# Patient Record
Sex: Male | Born: 1950 | Race: White | Hispanic: No | Smoking: Current every day smoker
Health system: Southern US, Community
[De-identification: ages and names within clinical notes are randomized; demographics above are authoritative.]

## PROBLEM LIST (undated history)

## (undated) DIAGNOSIS — K08109 Complete loss of teeth, unspecified cause, unspecified class: Secondary | ICD-10-CM

## (undated) DIAGNOSIS — I10 Essential (primary) hypertension: Secondary | ICD-10-CM

## (undated) DIAGNOSIS — Z87442 Personal history of urinary calculi: Secondary | ICD-10-CM

## (undated) DIAGNOSIS — D696 Thrombocytopenia, unspecified: Secondary | ICD-10-CM

## (undated) DIAGNOSIS — N200 Calculus of kidney: Secondary | ICD-10-CM

## (undated) DIAGNOSIS — J189 Pneumonia, unspecified organism: Secondary | ICD-10-CM

## (undated) DIAGNOSIS — R7302 Impaired glucose tolerance (oral): Secondary | ICD-10-CM

## (undated) DIAGNOSIS — J449 Chronic obstructive pulmonary disease, unspecified: Secondary | ICD-10-CM

## (undated) DIAGNOSIS — I739 Peripheral vascular disease, unspecified: Secondary | ICD-10-CM

## (undated) DIAGNOSIS — K859 Acute pancreatitis without necrosis or infection, unspecified: Secondary | ICD-10-CM

## (undated) DIAGNOSIS — N529 Male erectile dysfunction, unspecified: Secondary | ICD-10-CM

## (undated) DIAGNOSIS — IMO0002 Reserved for concepts with insufficient information to code with codable children: Secondary | ICD-10-CM

## (undated) DIAGNOSIS — Q899 Congenital malformation, unspecified: Secondary | ICD-10-CM

## (undated) DIAGNOSIS — Z972 Presence of dental prosthetic device (complete) (partial): Secondary | ICD-10-CM

## (undated) HISTORY — DX: Thrombocytopenia, unspecified: D69.6

## (undated) HISTORY — DX: Acute pancreatitis without necrosis or infection, unspecified: K85.90

## (undated) HISTORY — PX: MULTIPLE TOOTH EXTRACTIONS: SHX2053

## (undated) HISTORY — DX: Male erectile dysfunction, unspecified: N52.9

## (undated) HISTORY — DX: Reserved for concepts with insufficient information to code with codable children: IMO0002

## (undated) HISTORY — DX: Calculus of kidney: N20.0

## (undated) HISTORY — PX: APPENDECTOMY: SHX54

## (undated) HISTORY — DX: Impaired glucose tolerance (oral): R73.02

## (undated) HISTORY — DX: Chronic obstructive pulmonary disease, unspecified: J44.9

## (undated) HISTORY — PX: ORCHIECTOMY: SHX2116

## (undated) HISTORY — PX: VASECTOMY: SHX75

## (undated) HISTORY — PX: CHOLECYSTECTOMY: SHX55

---

## 1998-06-05 ENCOUNTER — Emergency Department (HOSPITAL_COMMUNITY): Admission: EM | Admit: 1998-06-05 | Discharge: 1998-06-05 | Payer: Self-pay | Admitting: Emergency Medicine

## 1998-06-14 ENCOUNTER — Observation Stay (HOSPITAL_COMMUNITY): Admission: EM | Admit: 1998-06-14 | Discharge: 1998-06-15 | Payer: Self-pay | Admitting: Urology

## 2001-08-22 ENCOUNTER — Ambulatory Visit (HOSPITAL_COMMUNITY): Admission: RE | Admit: 2001-08-22 | Discharge: 2001-08-22 | Payer: Self-pay | Admitting: Family Medicine

## 2001-08-22 ENCOUNTER — Encounter: Payer: Self-pay | Admitting: Family Medicine

## 2002-09-24 ENCOUNTER — Inpatient Hospital Stay (HOSPITAL_COMMUNITY): Admission: EM | Admit: 2002-09-24 | Discharge: 2002-09-28 | Payer: Self-pay | Admitting: Emergency Medicine

## 2002-09-24 ENCOUNTER — Encounter: Payer: Self-pay | Admitting: Emergency Medicine

## 2002-09-25 ENCOUNTER — Encounter: Payer: Self-pay | Admitting: Family Medicine

## 2002-09-26 ENCOUNTER — Encounter (INDEPENDENT_AMBULATORY_CARE_PROVIDER_SITE_OTHER): Payer: Self-pay | Admitting: Internal Medicine

## 2004-05-26 ENCOUNTER — Inpatient Hospital Stay (HOSPITAL_COMMUNITY): Admission: EM | Admit: 2004-05-26 | Discharge: 2004-05-29 | Payer: Self-pay | Admitting: *Deleted

## 2009-09-02 ENCOUNTER — Ambulatory Visit (HOSPITAL_COMMUNITY): Admission: RE | Admit: 2009-09-02 | Discharge: 2009-09-02 | Payer: Self-pay | Admitting: Family Medicine

## 2009-09-07 ENCOUNTER — Encounter: Payer: Self-pay | Admitting: Internal Medicine

## 2009-09-16 ENCOUNTER — Ambulatory Visit (HOSPITAL_COMMUNITY): Admission: RE | Admit: 2009-09-16 | Discharge: 2009-09-16 | Payer: Self-pay | Admitting: Family Medicine

## 2011-01-16 NOTE — Letter (Signed)
Summary: TCS ORDER  TCS ORDER   Imported By: Elinor Parkinson 09/07/2009 12:34:29  _____________________________________________________________________  External Attachment:    Type:   Image     Comment:   External Document

## 2011-05-04 NOTE — Procedures (Signed)
NAME:  Raymond Walker, Raymond Walker                          ACCOUNT NO.:  [id number]   MEDICAL RECORD NO.:  [id number]                   PATIENT TYPE:  INP   LOCATION:  A209                                 FACILITY:  [facility]   PHYSICIAN:  Edward L. Juanetta Gosling, M.D.             DATE OF BIRTH:  05-06-1951   DATE OF PROCEDURE:  DATE OF DISCHARGE:  05/29/2004                              PULMONARY FUNCTION TEST   FINDINGS:  1. Spirometry shows no definite ventilatory defect, but evidence of air-flow     obstruction.  2. Lung volumes showed no evidence of restrictive change, but do show some     air trapping.  3. DLCO is borderline reduced.      ___________________________________________                                            Oneal Deputy. Juanetta Gosling, M.D.   ELH/MEDQ  D:  05/30/2004  T:  05/30/2004  Job:  [id number]   cc:   Lorin Picket A. Gerda Diss, M.D.  [address], [address]  [place]  [place] [address]  Fax: [phone]

## 2011-05-04 NOTE — Discharge Summary (Signed)
NAME:  Raymond Walker, Raymond Walker                          ACCOUNT NO.:  [id number]   MEDICAL RECORD NO.:  [id number]                   PATIENT TYPE:  INP   LOCATION:  A209                                 FACILITY:  [facility]   PHYSICIAN:  Scott A. Gerda Diss, M.D.               DATE OF BIRTH:  [birth year]   DATE OF ADMISSION:  05/25/2004  DATE OF DISCHARGE:  05/29/2004                                 DISCHARGE SUMMARY   DISCHARGE DIAGNOSIS:  Chest pain.   It was felt that this could well be costochondritis but is hard to discern.  The patient was admitted in, treated with enzymes, cardiac consult,  cardiology stay because of his previous cardiac disease, and they recommend  going ahead and doing some testing. A D-dimer and CT scan of the chest was  done that did not show any pulmonary embolus. He was held over the weekend  because of inability to get a Cardiolite study done over the weekend, and he  was very stable. A CT scan was done on June 11 that did not show pulmonary  embolus. On June 12, he did not have any chest pain. On June 13, he had  Cardiolite study done, and the Cardiolite images reveal no ischemia. His  ejection fraction overall was good, and he was discharged to home to follow  up with Dr. ___________ management of his risk factors, and he was  encouraged strongly to stop smoking.     ___________________________________________                                         Jonna Coup. Gerda Diss, M.D.   Linus Orn  D:  07/06/2004  T:  07/06/2004  Job:  [id number]

## 2011-05-04 NOTE — H&P (Signed)
NAME:  Raymond Walker, Raymond Walker                          ACCOUNT NO.:  [id number]   MEDICAL RECORD NO.:  [id number]                   PATIENT TYPE:  INP   LOCATION:  A201                                 FACILITY:  [facility]   PHYSICIAN:  Hanley Hays. Dechurch, M.D.           DATE OF BIRTH:  July 24, 1951   DATE OF ADMISSION:  09/24/2002  DATE OF DISCHARGE:                                HISTORY & PHYSICAL   HISTORY OF PRESENT ILLNESS:  The patient is a 60 year old Caucasian male  followed by Dr. Gerda Diss with no chronic medical problems.  He started with  periumbilical pain about four days prior to admission which radiated to the  epigastric area and persisted.  Today he developed associated nausea and  vomiting x 1.  No coffee grounds.  Vomit was described as string-like  tissue.  No exacerbation with meals or p.o.'s, and states he was eating  normally up until today.  Today he noticed increased pain with movement.  He  was seen by his primary M.D. today in the office and given Vicodin and  Nexium, but the pain and nausea increased, and the patient presented to the  emergency room.   REVIEW OF SYSTEMS:  Pertinent for no fevers, no chills.  Urine output has  been decreased since this a.m., but states he has been eating and drinking  normally.  Denies any alcohol or drug use.  Positive obstipation, usually  stools three to four times daily since his cholecystectomy.  No chest pain.  He had some shortness of breath initially, but that was associated with the  pain.  History of nephrolithiasis, but this pain is different.  Denies any  reflux or any other GI complaints.  Denies any history of hypertension, pain  not like his kidney stones.   ALLERGIES:  None known.   MEDICATIONS:  No usual medications.  He was given Vicodin and Nexium today.  No over-the-counter medicines or preps.   SOCIAL HISTORY:  Alcohol:  None.  He is a Games developer.  He is married,  has six children.  Smokes 2-1/2 packs per day  and has done so for years.   FAMILY MEDICAL HISTORY:  Mother has had multiple MIs.  Father died of cancer  and apparently had some sort of GI complaints though unknown what type  cancer.  A daughter has hereditary pancreatitis.   PAST MEDICAL HISTORY:  1. Significant for multiple episodes of nephrolithiasis.  2. Cholecystitis, status post cholecystectomy in 1996.   PHYSICAL EXAMINATION:  GENERAL:  Sedated gentleman who appears comfortable,  in no distress.  VITAL SIGNS:  Blood pressure 130/70, pulse in the 70s and regular,  respiratory rate is 10.  LUNGS:  Quiet.  NECK:  There is no JVD, adenopathy, thyromegaly.  No axillary or inguinal  adenopathy.  HEART:  Regular.  No murmur or gallop.  ABDOMEN:  Protuberant, with few bowel sounds.  Soft.  Positive  right  epigastric tenderness, very pronounced.  No masses noted.  No hepatomegaly.  No bruits.  RECTAL:  Per the ER M.D. and reportedly negative, with heme-negative stool.   LABORATORY DATA:  Including BMP, liver profile, CBC normal.  Lipase 65,  amylase normal.   ASSESSMENT AND PLAN:  1. Abdominal pain, increasing and persistent, with mild increased lipase and     no other symptoms.  Belly films pending.  Family medical history of     pancreatitis without any history of alcohol.  Question retained stone     versus hyperlipidemia.  The patient needs further workup.  Admit, bowel     rest, serial monitoring of enzymes, GI consultation in the morning.  2. Tobacco abuse.  3. Nephrolithiasis, recurrent.  Urinalysis is pending, though this is not     consistent with same.  CT scan was ordered but, unfortunately, due to     technical difficulties cannot be obtained tonight.  Currently he is     hemodynamically stable.  Admit with IV fluids and the plan as noted     above.                                               Hanley Hays Josefine Class, M.D.    FED/MEDQ  D:  09/24/2002  T:  09/25/2002  Job:  [id number]

## 2011-05-04 NOTE — Op Note (Signed)
NAME:  Raymond Walker, Raymond Walker                          ACCOUNT NO.:  [id number]   MEDICAL RECORD NO.:  [id number]                   PATIENT TYPE:  INP   LOCATION:  A201                                 FACILITY:  [facility]   PHYSICIAN:  Lionel December, M.D.                 DATE OF BIRTH:  08-13-1951   DATE OF PROCEDURE:  09/27/2002  DATE OF DISCHARGE:                                 OPERATIVE REPORT   PROCEDURE:  Esophagogastroduodenoscopy followed by sequential dilatation.   ENDOSCOPIST:  Lionel December, M.D.   INDICATIONS:  This patient is a 60 year old black male who has acute  pancreatitis.  His presentation and lab studies but there is no pancreatic  abnormalities seen on ultrasound or CT.  While he was getting a CT he  vomited contrast and some blood as well.  Therefore, I am concerned that he  could have a penetrating ulcer causing his pancreatitis and undergoing this  study.  The procedure and risks were reviewed with the patient and informed  consent was obtained.   PREOPERATIVE MEDICATIONS:  Cetacaine spray for pharyngeal topical  anesthesia, Demerol 50 mg IV and Versed 4 mg IV in divided dose.   INSTRUMENT:  Olympus video system.   FINDINGS:  Procedure performed in endoscopy suite.  The patient's vital  signs and O2 saturation were monitored during the procedure and remained  stable.  The patient was placed in the left lateral recumbent position and  endoscope was passed via the oropharynx without any difficulty into the  esophagus.   ESOPHAGUS:  Mucosa of the esophagus was normal except that distally the area  had 3 erosions.  The squamocolumnar junction was unremarkable; and there was  a small sliding hiatal hernia.   STOMACH:  It had some bile in it, but there was no good debris.  The stomach  distended very well with insufflation.  Examination of the mucosa revealed  erythema and edema at body and antrum, but no erosions or ulcers were noted.  Pyloric channel was patent.   Angularis and fundus were examined by  retroflexing the scope and were normal.   DUODENUM:  Examination of the bulb and second part of the duodenum was  normal.   The endoscope was withdrawn.  The patient tolerated the procedure well.   FINAL DIAGNOSES:  1. Erosive reflux esophagitis with small sliding hiatal hernia.  2. Nonerosive gastritis.  3. No lesion found to account for his pancreatitis and/or hematemesis.   RECOMMENDATIONS:  1. Will continue with PPI __________ to p.o. routes.  2. Full liquids.  3. If pain relapses would consider endoscopic ultrasound.  4. Also asked patient not to use minithins anymore which is very high in     caffeine and ephedrine.  Lionel December, M.D.    NR/MEDQ  D:  09/27/2002  T:  09/27/2002  Job:  [id number]   cc:   Lorin Picket A. Gerda Diss, M.D.

## 2011-05-04 NOTE — Consult Note (Signed)
NAME:  Raymond Walker, Raymond Walker                          ACCOUNT NO.:  [id number]   MEDICAL RECORD NO.:  [id number]                   PATIENT TYPE:  INP   LOCATION:  A201                                 FACILITY:  [facility]   PHYSICIAN:  Lionel December, M.D.                 DATE OF BIRTH:  1951/04/14   DATE OF CONSULTATION:  09/25/2002  DATE OF DISCHARGE:                                   CONSULTATION   REASON FOR CONSULTATION:  Acute pancreatitis.   HISTORY OF PRESENT ILLNESS:  The patient is a 60 year old Caucasian male who  was admitted to Dr. Fanny Dance service yesterday via the emergency room.  He was actually evaluated in the hospital by Dr. Josefine Class for Dr. Lilyan Punt.   The patient was in his usual state of health until four days prior to  admission when he developed pain at the epigastrium and across the upper  abdomen.  The pain was mild for the first two days and then the night before  last it became unbearable.  He also had a single episode of vomiting.  He  was brought to the emergency room yesterday.  He was noted to be in a great  deal of pain.  His lab studies revealed a WBC of 8.5, H&H was 15.9 and 46.5,  platelet count was 128,000.  His electrolytes were normal.  BUN was 17,  creatinine 1.0, calcium 8.7, total protein 6.7, with an albumin of 3.6.  His  bilirubin was 0.5, AP 84, AST 29, ALT 22, serum amylase was 126, and lipase  was 145.  On admission, Dr. Josefine Class was not convinced that he had  pancreatitis based on his lab studies.  He was admitted and begun on IV  fluids and on Dilaudid for pain control and with Phenergan for nausea.  His  amylase and lipase were repeated this morning and they were 658 and 670.  He  was therefore felt to have pancreatitis.  He had an upper abdominal  ultrasound earlier today.  The study was limited due to bowel gas.  There  was suggestion of a prominence to the pancreatic duct and the spleen was  slightly enlarged.  Two cysts were seen  in the left kidney, along with  echogenic liver consistent with fatty change.  As far as symptoms are  concerned, the patient is feeling better.  He is hungry.  He denies  heartburn, history of peptic ulcer disease, melena, or rectal bleeding.  He  does not recall that he ever had pain like this.  He used to drink alcohol  in excessive amounts but quit it about 10 years ago.  He has never been  diagnosed with pancreatitis in the past.  He does not take any medications  on a regular basis.  He has had intermittent postprandial diarrhea since he  has his gallbladder surgery.  While in the hospital,  he did have a low-grade  fever and Dr. Gerda Diss obtained blood cultures and started him on Cipro and  Flagyl IV.   PAST MEDICAL HISTORY:  He had left orchiectomy.  Apparently, he had some  problems, maybe due to undescended testes.  History of kidney stones.  He  has had bowel removed via cystoscopy.  He has had lithotripsy as well.  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  He quit about 10 years ago.  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   ALLERGIES:  None known.   SOCIAL HISTORY:  He is married and he has three of his own and three adopted  children.  One of his daughters has had recurrent pancreatitis, etiology  unknown.  Her workup has not been completed yet.  He is a Publishing copy.  He has been smoking cigarettes since he was age 52.  He tells me that he has  quit as of today.  He went down to smoke but was unable to do so.   FAMILY HISTORY:  Father is deceased.  Mother has multiple problems.  He has  a sister who has asthma.   PHYSICAL EXAMINATION:  GENERAL:  Well-developed, well-nourished Caucasian  male who is in no acute distress.  VITAL SIGNS:  He weighs 193 pounds.  He is 5 feet 11 inches tall.  Pulse 94  per minute, blood pressure 119/79, respiratory rate 18, temperature is  100.5.  HEENT:  Conjunctiva is pink.  Sclera is non-icteric.  Oropharyngeal mucosa   is normal.  He has an upper dental plate and few of his own teeth in the  lower jaw in not so good condition.  NECK:  Without masses or thyromegaly.  CARDIAC:  Regular rhythm.  Normal S1, S2.  No murmur or gallop noted.  ABDOMEN:  Symmetrical.  Bowel sounds are normal.  Palpation reveals a soft  abdomen with moderate tenderness at the epigastrium and below costal  margins.  No organomegaly or masses.  RECTAL:  Deferred, as he had one on admission revealing guaiac-negative  stool.  EXTREMITIES:  No clubbing or edema noted.   LABORATORY DATA:  As above.   ASSESSMENT:  The patient is a 60 year old Caucasian male who has acute  pancreatitis.  He had a cholecystectomy about five years ago for biliary  dyskinesia and there was no evidence of cholelithiasis.  His serum calcium  is normal and there is no history of hypertriglyceridemia.  He is on no  medications whatsoever.  He does have a remote history of ethanol abuse and  could have had pancreatic injury and this episode could be tied to that.  He  does have a prominent pancreatic duct on ultrasound and this needs to be  further evaluated.  Other less likely etiologies would be penetrating peptic  ulcer disease or a pancreatic neoplasm.  It would be unusual for him to  present with acute pancreatitis secondary to pancreas divisum at age 12.   RECOMMENDATIONS:  I agree he needs to be n.p.o. except for p.o. medications.  I will start him on Protonix 40 mg IV q.24h.  Abdominal CT would be obtained  in the a.m. with attention to pancreas.  We will also repeat his LFT's,  amylase, and lipase in the a.m.   I would like to thank Dr. Lilyan Punt for the opportunity to participate in  the care of this gentleman.  Lionel December, M.D.    NR/MEDQ  D:  09/25/2002  T:  09/28/2002  Job:  [id number]

## 2011-05-04 NOTE — H&P (Signed)
NAME:  Raymond Walker, Raymond Walker                          ACCOUNT NO.:  [id number]   MEDICAL RECORD NO.:  [id number]                   PATIENT TYPE:  INP   LOCATION:  A204                                 FACILITY:  [facility]   PHYSICIAN:  Scott A. Gerda Diss, M.D.               DATE OF BIRTH:  August 22, 1951   DATE OF ADMISSION:  05/25/2004  DATE OF DISCHARGE:                                HISTORY & PHYSICAL   CHIEF COMPLAINT:  Left-sided chest pain and left arm pain.   HISTORY OF PRESENT ILLNESS:  A 60 year old white male, 2 day history of left-  sided chest pain, left arm pain as well, states that been going off and on  for the past couple of days.  He has noticed a fair amount of aching, hurts  to some degree with movement and with deep breath, but also just feels like  a pressure and an ache in his chest and feels like he cannot quite breathe  as good as he would like.  Also states he broke out in some sweats with it,  started hurting last night, and got to the severe point, and he was  concerned, came to the emergency department and, at the emergency  department, he was worked up and admitted in.  He states that the nitro-drip  helped him with the pain and discomfort.  He does not do much in the way of  aerobic activity, so he was unable to answer if he gets any chest tightness  or pressure with walking up a hill or things like that.  He states his  overall energy level recently has been fair, and he has been stressed out,  working too many hours per day on a daily basis, doing a lot of physical  work, being on his feet a lot.   PAST MEDICAL HISTORY:  1. Kidney stones.  2. Cholecystectomy.  3. Pancreatitis 2 years ago.  4. 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  He     states he has no history of cholesterol and hypertension, but he also     states he does not come to the doctor on a regular basis.   SOCIAL HISTORY:  Married, smokes 2-3 pack-per-day for 40 years and  also  drinks a lot of Coca-Colas, caffeinated coffees, plus takes caffeine pills.   FAMILY HISTORY:  Heart disease both sides.  Mom had a heart attack at age  60, is still living though.  Strokes, hypertension, heart disease run in the  family he states.   REVIEW OF SYSTEMS:  He relates hematuria over the past couple of days  intermittently, has a history of kidney stones but denies kidney stone pain.  He also has been under a severe amount of stress.   PHYSICAL EXAMINATION:  GENERAL:  AND.  HEENT:  Benign.  NECK:  No masses.  CHEST:  CTA.  HEART:  Regular.  ABDOMEN:  Soft.  EXTREMITIES:  No edema.  VITAL SIGNS:  Blood pressure good.   LABORATORY DATA:  Hemoglobin 14.2, hematocrit 40.2, sodium 141, potassium  3.9.  Series of cardiac tests while in the ED was negative.  His enzymes  this morning:  291, CK-MB 7.7, troponin 0.02.  Urinalysis did show 21-50  RBCs.  Of note that was left out from above.  On the chest exam, he had  chest wall tenderness on the left side with severe tenderness and pain and  discomfort with palpation.  X-ray is unavailable currently.  EKG:  No acute  changes are noted.   ASSESSMENT/PLAN:  Chest pain - Most likely this is costochondritis, but the  slight change in enzymes a little bit concerning.  Also concerning are  strong family risk factors in some of his symptoms.  I do feel he needs to  have a Cardiolite study done.  I will have cardiology take a look and see if  they agree, and possibly I will send him home later today.  If they do not  agree and want to lean toward catheterization, they will take it from there.  We will address the other problems including hematuria, excess caffeine use,  tobacco use, etc. as an outpatient.     ___________________________________________                                         Jonna Coup. Gerda Diss, M.D.   Linus Orn  D:  05/26/2004  T:  05/26/2004  Job:  [id number]

## 2011-05-04 NOTE — Discharge Summary (Signed)
   NAME:  Raymond Walker, Raymond Walker                          ACCOUNT NO.:  [id number]   MEDICAL RECORD NO.:  [id number]                   PATIENT TYPE:  INP   LOCATION:  A201                                 FACILITY:  [facility]   PHYSICIAN:  Scott A. Gerda Diss, M.D.               DATE OF BIRTH:  Aug 01, 1951   DATE OF ADMISSION:  09/24/2002  DATE OF DISCHARGE:  09/28/2002                                 DISCHARGE SUMMARY   DISCHARGE DIAGNOSIS:  Pancreatitis.   HOSPITAL COURSE:  The patient was admitted in with elevated enzymes,  discomfort, pain, had gastroenterology see the patient.  Had a CT scan done  which did not show any specific evidence of pancreatitis.  EGD showed  erosive esophagitis and small hiatal hernia.  The patient gradually improved  and was felt stable for discharge on September 28, 2002.   FINAL DIAGNOSES:  1. Gastroesophageal reflux disease.  2. Esophagitis.  3. Pancreatitis.   CONDITION ON DISCHARGE:  The patient was discharged home in good condition  on medication to follow up in the office in the near future.   DISCHARGE MEDICATIONS:  Vicodin p.r.n. and Nexium one q.d.   FOLLOW UP:  To follow up somewhere in the next week with Dr. Lubertha South.                                               Scott A. Gerda Diss, M.D.    SAL/MEDQ  D:  11/29/2002  T:  11/30/2002  Job:  [id number]

## 2011-05-04 NOTE — Procedures (Signed)
NAME:  PURVIS, SIDLE                          ACCOUNT NO.:  [id number]   MEDICAL RECORD NO.:  [id number]                   PATIENT TYPE:  INP   LOCATION:  A209                                 FACILITY:  [facility]   PHYSICIAN:  Charlton Haws, M.D.                  DATE OF BIRTH:  [birth year]   DATE OF PROCEDURE:  05/29/2004  DATE OF DISCHARGE:                                    STRESS TEST   EXERCISE CARDIOLITE:   INDICATION:  Mr. Westervelt is a 60 year old male with no known coronary artery  disease with atypical chest discomfort.  He has had three sets of cardiac  enzymes that are not consistent with an acute myocardial infarction.  He has  significant cardiac risk factors including significant tobacco abuse and  family history.   BASELINE DATA:  EKG reveals sinus rhythm at 73 beats per minute with  nonspecific ST abnormalities, blood pressure is 132/90.  Patient walked for  approximately 6 minutes and 31 seconds to 7.0 METS and a Bruce Protocol  Stage 3.  Maximum heart rate was 151 beats per minute which is 91% of  predicted maximum.  Maximum blood pressure is 176/100.  Patient denied any  chest discomfort or shortness of breath with exercise.  Exercise was stopped  secondary to fatigue.  EKG revealed no ischemic changes and no arrhythmia.   FINAL IMAGES AND RESULTS:  Pending MD review.     ________________________________________  ___________________________________________  Jae Dire, P.A. [facility]                      Charlton Haws, M.D.   AB/MEDQ  D:  05/29/2004  T:  05/29/2004  Job:  [id number]

## 2011-05-04 NOTE — Consult Note (Signed)
NAME:  Raymond Walker, Raymond Walker                          ACCOUNT NO.:  [id number]   MEDICAL RECORD NO.:  [id number]                   PATIENT TYPE:  INP   LOCATION:  A204                                 FACILITY:  [facility]   PHYSICIAN:  Vida Roller, M.D.                DATE OF BIRTH:  Jan 12, 1951   DATE OF CONSULTATION:  05/26/2004  DATE OF DISCHARGE:                                   CONSULTATION   CARDIOLOGY CONSULTATION:   PRIMARY PHYSICIAN:  Scott A. Gerda Diss, M.D.   HISTORY OF PRESENT ILLNESS:  [name] is a 60 year old male with known  coronary artery disease who presented to [facility] complaining of  chest pain.  He reports 2 days of constant chest discomfort which he  describes as a semi-sharp pain in his left anterior chest which gradually  worsened over the course of the last 2-1/2 days, he describes a significant  pleuritic component to the chest discomfort, it does radiate to his left  arm, he does have paresthesias in his left arm, he was given some  nitroglycerin in the emergency department which gave him some relief but he  still has associated discomfort in his chest, he does have associated  shortness of breath and diaphoresis, no nausea or vomiting, the pain  worsened with exertion but also worsened when he took a deep breath.  He  says that the pain is substantially less than it was when he came in.  He  was also noted on admission to have hematuria but no hemoptysis.  He has no  dysuria, no frequency, and no urgency.  He does have a history of  nephrolithiasis, he has had a cholecystectomy, he has a history of  pancreatitis in the past for unknown reasons, he does have chronic leg pain,  and ongoing tobacco abuse.   MEDICATIONS:  His medications prior to admission are none.  Says he takes  something at night to go to bed but he is not sure what it is (there is no  documentation of it anywhere in his chart).  Currently in the hospital he is  on 81 mg of aspirin, 1  inch of nitroglycerin paste q.6h., a saline lock,  Tylenol and Xanax on a p.r.n. basis.   SOCIAL HISTORY:  He lives in [place] with his wife, he has three grown  children, he works as a Games developer.  He has about a 120 pack-year  smoking history and has smoked for 40 years, no alcohol, he does  occasionally use caffeine tablets and ephedrine.   FAMILY HISTORY:  His mother is alive but had an MI at 18.  Father died at  age 68 of cancer of the kidneys.  He has a sister who he does not know.   REVIEW OF SYSTEMS:  His review of systems is generally negative except for  that reviewed in the history of present illness.  PHYSICAL EXAMINATION:  GENERAL:  He is a well-developed, well-nourished  white male in no apparent distress who was sleeping when I approached.  VITAL SIGNS:  He is afebrile, his pulse is 59, his respiratory rate is 18,  his blood pressure is 116/73, he is saturating 97% on 2 L nasal cannula.  HEENT:  Examination of the head, ears, eyes, nose and throat is unremarkable  except for severely poor dentition.  NECK:  His neck is supple, he has no jugular venous distention or carotid  bruits.  CARDIOVASCULAR:  He has a normal first and second heart sound, a 2/6  systolic murmur that worsens when he takes a deep breath.  LUNGS:  His lungs he has decreased breath sounds throughout.  ABDOMEN:  His abdomen is soft, nontender, normoactive bowel sounds.  EXTREMITIES:  Lower extremities are without significant clubbing, cyanosis,  or edema.  There is no tenderness in his calves.  He has 2+ pulses  throughout his lower extremities.  NEUROLOGIC:  His neurologic exam is nonfocal.   CHEST X-RAY:  His chest x-ray shows no acute distress as per the ER report.   ELECTROCARDIOGRAM:  Electrocardiogram shows sinus rhythm with a normal axes,  normal intervals, no ST-T wave changes concerning for ischemia, heart rate  of 77, no Q waves.   LABORATORIES:  His white blood cell count is  7.4, H&H of 14 and 40 with a  platelet count of 124 with no old CBCs to see if his platelet count is new.  His sodium is 141, potassium 3.9, chloride 109, bicarb 29, BUN 15,  creatinine 1, and his blood sugar is 104.  One set of cardiac enzymes is  negative.  Three point-of-care enzymes are also negative for acute  myocardial infarction.  He does have too-numerous-to-count red blood cells  in his urine.   IMPRESSION:  This is a gentleman with chest pain which is very atypical for  coronary disease but he does have multiple risk factors, he does complain of  lower extremity pain but has no obvious focal signs for a deep vein  thrombosis, he has ongoing tobacco abuse, he has a history of hematuria and  he has a low platelet count.   RECOMMENDATIONS:  My recommendations are that we obtain a D-dimer and  consider getting a spiral CT of his chest.  Given his low platelet count and  his hematuria I recommend against anticoagulating him at this time because  we do not have a definitive diagnosis of pulmonary embolus.  If we indeed do  find that he does have a pulmonary embolus I recommend a urologic consult to  make sure he does not have a focal source for his hematuria and that his  platelet count be further evaluated by hematology for consideration for  anticoagulation as he may need to have Lovenox and then subsequently  Coumadin.  I have put him on the schedule for an exercise Cardiolite in the  case that his CT scan of his chest is negative and he does not have a  pulmonary embolus however, I recommend that both the hematuria and the low  platelet count be further evaluated.      ___________________________________________                                            Vida Roller, M.D.   JH/MEDQ  D:  05/26/2004  T:  05/26/2004  Job:  [id number]

## 2011-11-14 ENCOUNTER — Emergency Department (HOSPITAL_COMMUNITY)
Admission: EM | Admit: 2011-11-14 | Discharge: 2011-11-14 | Disposition: A | Discharge status: Home / Self Care | Payer: Managed Care, Other (non HMO) | Attending: Emergency Medicine | Admitting: Emergency Medicine

## 2011-11-14 ENCOUNTER — Encounter: Payer: Self-pay | Admitting: Emergency Medicine

## 2011-11-14 ENCOUNTER — Emergency Department (HOSPITAL_COMMUNITY): Payer: Managed Care, Other (non HMO)

## 2011-11-14 DIAGNOSIS — R0602 Shortness of breath: Secondary | ICD-10-CM | POA: Insufficient documentation

## 2011-11-14 DIAGNOSIS — R0789 Other chest pain: Secondary | ICD-10-CM

## 2011-11-14 DIAGNOSIS — F17200 Nicotine dependence, unspecified, uncomplicated: Secondary | ICD-10-CM | POA: Insufficient documentation

## 2011-11-14 DIAGNOSIS — R209 Unspecified disturbances of skin sensation: Secondary | ICD-10-CM | POA: Insufficient documentation

## 2011-11-14 DIAGNOSIS — R071 Chest pain on breathing: Secondary | ICD-10-CM | POA: Insufficient documentation

## 2011-11-14 LAB — DIFFERENTIAL
Basophils Absolute: 0 10*3/uL (ref 0.0–0.1)
Basophils Relative: 1 % (ref 0–1)
Eosinophils Absolute: 0.2 10*3/uL (ref 0.0–0.7)
Eosinophils Relative: 3 % (ref 0–5)
Lymphocytes Relative: 31 % (ref 12–46)
Lymphs Abs: 1.9 10*3/uL (ref 0.7–4.0)
Monocytes Absolute: 0.5 10*3/uL (ref 0.1–1.0)
Monocytes Relative: 8 % (ref 3–12)
Neutro Abs: 3.7 10*3/uL (ref 1.7–7.7)
Neutrophils Relative %: 58 % (ref 43–77)

## 2011-11-14 LAB — HEPATIC FUNCTION PANEL
ALT: 13 U/L (ref 0–53)
AST: 12 U/L (ref 0–37)
Albumin: 3.5 g/dL (ref 3.5–5.2)
Alkaline Phosphatase: 104 U/L (ref 39–117)
Bilirubin, Direct: 0.1 mg/dL (ref 0.0–0.3)
Indirect Bilirubin: 0.4 mg/dL (ref 0.3–0.9)
Total Bilirubin: 0.5 mg/dL (ref 0.3–1.2)
Total Protein: 7.2 g/dL (ref 6.0–8.3)

## 2011-11-14 LAB — CBC
HCT: 47.8 % (ref 39.0–52.0)
Hemoglobin: 16.2 g/dL (ref 13.0–17.0)
MCH: 31.2 pg (ref 26.0–34.0)
MCHC: 33.9 g/dL (ref 30.0–36.0)
MCV: 92.1 fL (ref 78.0–100.0)
Platelets: 111 10*3/uL — ABNORMAL LOW (ref 150–400)
RBC: 5.19 MIL/uL (ref 4.22–5.81)
RDW: 13.8 % (ref 11.5–15.5)
WBC: 6.3 10*3/uL (ref 4.0–10.5)

## 2011-11-14 LAB — BASIC METABOLIC PANEL
BUN: 10 mg/dL (ref 6–23)
CO2: 27 mEq/L (ref 19–32)
Calcium: 9.4 mg/dL (ref 8.4–10.5)
Chloride: 104 mEq/L (ref 96–112)
Creatinine, Ser: 0.85 mg/dL (ref 0.50–1.35)
GFR calc Af Amer: >90 mL/min (ref >90)
GFR calc non Af Amer: >90 mL/min (ref >90)
Glucose, Bld: 148 mg/dL — ABNORMAL HIGH (ref 70–99)
Potassium: 3.5 mEq/L (ref 3.5–5.1)
Sodium: 141 mEq/L (ref 135–145)

## 2011-11-14 LAB — CARDIAC PANEL(CRET KIN+CKTOT+MB+TROPI)
CK, MB: 3 ng/mL (ref 0.3–4.0)
Relative Index: INVALID (ref 0.0–2.5)
Total CK: 55 U/L (ref 7–232)
Troponin I: <0.3 ng/mL (ref <0.30)

## 2011-11-14 LAB — POCT I-STAT TROPONIN I: Troponin i, poc: 0 ng/mL (ref 0.00–0.08)

## 2011-11-14 MED ORDER — OXYCODONE-ACETAMINOPHEN 5-325 MG PO TABS
1.0000 | ORAL_TABLET | Freq: Once | ORAL | Status: AC
  Administered 2011-11-14: 1 via ORAL
  Filled 2011-11-14: qty 1

## 2011-11-14 MED ORDER — KETOROLAC TROMETHAMINE 30 MG/ML IJ SOLN
30.0000 mg | Freq: Once | INTRAMUSCULAR | Status: AC
  Administered 2011-11-14: 30 mg via INTRAVENOUS
  Filled 2011-11-14: qty 1

## 2011-11-14 MED ORDER — ALBUTEROL SULFATE (5 MG/ML) 0.5% IN NEBU
5.0000 mg | INHALATION_SOLUTION | Freq: Once | RESPIRATORY_TRACT | Status: AC
  Administered 2011-11-14: 5 mg via RESPIRATORY_TRACT
  Filled 2011-11-14 (×2): qty 1

## 2011-11-14 MED ORDER — IOHEXOL 350 MG/ML SOLN
100.0000 mL | Freq: Once | INTRAVENOUS | Status: AC | PRN
  Administered 2011-11-14: 100 mL via INTRAVENOUS

## 2011-11-14 MED ORDER — TRAMADOL HCL 50 MG PO TABS: 50.0000 mg | ORAL_TABLET | Freq: Four times a day (QID) | ORAL | Status: AC | PRN

## 2011-11-14 MED ORDER — IPRATROPIUM BROMIDE 0.02 % IN SOLN
0.5000 mg | Freq: Once | RESPIRATORY_TRACT | Status: AC
  Administered 2011-11-14: 0.5 mg via RESPIRATORY_TRACT
  Filled 2011-11-14 (×2): qty 2.5

## 2011-11-14 NOTE — ED Provider Notes (Signed)
History  This chart was scribed for Raymond Lennert, MD by Bennett Scrape. This patient was seen in room APA10/[facility]10 and the patient's care was started at 7:54AM.  CSN: [id number] Arrival date & time: 11/14/2011  7:29 AM   First MD Initiated Contact with Patient 11/14/11 [phone]      Chief Complaint  Patient presents with  . Chest Pain    Patient is a 60 y.o. male presenting with chest pain. The history is provided by the patient. No language interpreter was used.  Chest Pain The chest pain began 6 - 12 hours ago. Chest pain occurs constantly. The chest pain is worsening. The pain is associated with exertion and lifting. At its most intense, the pain is at 6/10. The pain is currently at 6/10. The severity of the pain is mild. The quality of the pain is described as pressure-like and aching. The pain does not radiate. Chest pain is worsened by exertion. Pertinent negatives for primary symptoms include no fever, no fatigue, no cough and no abdominal pain.  Associated symptoms include numbness (fingers).  Pertinent negatives for associated symptoms include no diaphoresis and no near-syncope. He tried nothing for the symptoms.  Pertinent negatives for past medical history include no MI, no seizures and no strokes.  His family medical history is significant for stroke in family.    Raymond Walker is a 60 y.o. male who presents to the Emergency Department complaining of 8 hours of constant, gradually worsening chest pain described as an achy pressure that radiates to both of his arms and upper back. Pt reports that palpation makes the pressure worse. Pt also c/o intermittent SOB and mild numbness in the fingertips of both hands that began while in the ED. Pt denies fever, chills, nausea and diarrhea as associated symptoms. Pt reports a constant cough with whitish/ yellow sputum that he attributes to smoking. He denies any changes in work or social habits that would bring about symptoms.  Dr. Lilyan Punt is PCP.  Past Medical History  Diagnosis Date  . Cholecystitis     Past Surgical History  Procedure Date  . Cholecystectomy   . Testicle surgery    Family Hx: Pt states that he has a family history of MI and stroke.  Social Hx: Pt works as a Publishing copy.  History  Substance Use Topics  . Smoking status: Current Everyday Smoker -- 1.5 packs/day    Types: Cigarettes  . Smokeless tobacco: Not on file  . Alcohol Use: No      Review of Systems  Constitutional: Negative for fever, diaphoresis and fatigue.  HENT: Negative for congestion, sinus pressure and ear discharge.   Eyes: Negative for discharge.  Respiratory: Negative for cough.   Cardiovascular: Positive for chest pain. Negative for near-syncope.  Gastrointestinal: Negative for abdominal pain and diarrhea.  Genitourinary: Negative for frequency and hematuria.  Musculoskeletal: Negative for back pain.  Skin: Negative for rash.  Neurological: Positive for numbness (fingers). Negative for seizures and headaches.  Hematological: Negative.   Psychiatric/Behavioral: Negative for hallucinations.    Allergies  Review of patient's allergies indicates no known allergies.  Home Medications   Current Outpatient Rx  Name Route Sig Dispense Refill  . TRAMADOL HCL 50 MG PO TABS Oral Take 1 tablet (50 mg total) by mouth every 6 (six) hours as needed for pain. Maximum dose= 8 tablets per day 30 tablet 0    Triage vitals: BP 162/85  Pulse 77  Temp(Src) 97.6 F (36.4  C) (Oral)  Resp 20  Ht 6' (1.829 m)  Wt 169 lb (76.658 kg)  BMI 22.92 kg/m2  SpO2 97%  Physical Exam  Nursing note and vitals reviewed. Constitutional: He appears well-developed and well-nourished. No distress.  HENT:  Head: Normocephalic and atraumatic.  Right Ear: External ear normal.  Left Ear: External ear normal.  Eyes: Conjunctivae are normal. Right eye exhibits no discharge. Left eye exhibits no discharge. No scleral icterus.    Neck: Neck supple. No tracheal deviation present.  Cardiovascular: Normal rate, regular rhythm and intact distal pulses.   Pulmonary/Chest: Effort normal. No stridor. No respiratory distress. He has wheezes (mild bilateral wheezing). He has no rales. He exhibits tenderness (Mild tenderness to his left chest).  Abdominal: Soft. Bowel sounds are normal. He exhibits no distension. There is no tenderness. There is no rebound and no guarding.  Musculoskeletal: He exhibits no edema and no tenderness.  Neurological: He is alert. He has normal strength. No sensory deficit. Cranial nerve deficit:  no gross defecits noted. He exhibits normal muscle tone. He displays no seizure activity. Coordination normal.  Skin: Skin is warm and dry. No rash noted.  Psychiatric: He has a normal mood and affect.    ED Course  Procedures (including critical care time)  DIAGNOSTIC STUDIES: Oxygen Saturation is 99% on room air, normal by my interpretation.    COORDINATION OF CARE: 7:57AM-Discussed treatment plan with patient at bedside and patient agreed to plan. 9:28AM- Pt states that pain medication given in the ED did not improve symptoms. All labs and x-rays reviewed with patient and possible chest wall problems and emphysema flare up discussed. PT expressed concern about driving himself home. Breathing treatment and different pain medication discussed. Will do re-evaluation.  Results for orders placed during the hospital encounter of 11/14/11  CARDIAC PANEL(CRET KIN+CKTOT+MB+TROPI)      Component Value Range   Total CK 55  7 - 232 (U/L)   CK, MB 3.0  0.3 - 4.0 (ng/mL)   Troponin I <0.30  <0.30 (ng/mL)   Relative Index RELATIVE INDEX IS INVALID  0.0 - 2.5   CBC      Component Value Range   WBC 6.3  4.0 - 10.5 (K/uL)   RBC 5.19  4.22 - 5.81 (MIL/uL)   Hemoglobin 16.2  13.0 - 17.0 (g/dL)   HCT 16.1  09.6 - 04.5 (%)   MCV 92.1  78.0 - 100.0 (fL)   MCH 31.2  26.0 - 34.0 (pg)   MCHC 33.9  30.0 - 36.0 (g/dL)    RDW 40.9  81.1 - 91.4 (%)   Platelets 111 (*) 150 - 400 (K/uL)  DIFFERENTIAL      Component Value Range   Neutrophils Relative 58  43 - 77 (%)   Neutro Abs 3.7  1.7 - 7.7 (K/uL)   Lymphocytes Relative 31  12 - 46 (%)   Lymphs Abs 1.9  0.7 - 4.0 (K/uL)   Monocytes Relative 8  3 - 12 (%)   Monocytes Absolute 0.5  0.1 - 1.0 (K/uL)   Eosinophils Relative 3  0 - 5 (%)   Eosinophils Absolute 0.2  0.0 - 0.7 (K/uL)   Basophils Relative 1  0 - 1 (%)   Basophils Absolute 0.0  0.0 - 0.1 (K/uL)  BASIC METABOLIC PANEL      Component Value Range   Sodium 141  135 - 145 (mEq/L)   Potassium 3.5  3.5 - 5.1 (mEq/L)   Chloride 104  96 - 112 (mEq/L)   CO2 27  19 - 32 (mEq/L)   Glucose, Bld 148 (*) 70 - 99 (mg/dL)   BUN 10  6 - 23 (mg/dL)   Creatinine, Ser 1.61  0.50 - 1.35 (mg/dL)   Calcium 9.4  8.4 - 09.6 (mg/dL)   GFR calc non Af Amer >90  >90 (mL/min)   GFR calc Af Amer >90  >90 (mL/min)  HEPATIC FUNCTION PANEL      Component Value Range   Total Protein 7.2  6.0 - 8.3 (g/dL)   Albumin 3.5  3.5 - 5.2 (g/dL)   AST 12  0 - 37 (U/L)   ALT 13  0 - 53 (U/L)   Alkaline Phosphatase 104  39 - 117 (U/L)   Total Bilirubin 0.5  0.3 - 1.2 (mg/dL)   Bilirubin, Direct 0.1  0.0 - 0.3 (mg/dL)   Indirect Bilirubin 0.4  0.3 - 0.9 (mg/dL)  POCT I-STAT TROPONIN I      Component Value Range   Troponin i, poc 0.00  0.00 - 0.08 (ng/mL)   Comment 3            Ct Angio Chest W/cm &/or Wo Cm  11/14/2011  *RADIOLOGY REPORT*  Clinical Data:  Chest pain  CT ANGIOGRAPHY CHEST WITH CONTRAST  Technique:  Multidetector CT imaging of the chest was performed using the standard protocol during bolus administration of intravenous contrast.  Multiplanar CT image reconstructions including MIPs were obtained to evaluate the vascular anatomy.  Contrast: OMNIPAQUE IOHEXOL 350 MG/ML IV SOLN  Comparison:  05/26/2004  Findings: Aorta normal caliber without aneurysm or dissection. Coronary arterial calcifications noted.  Nonobstructing calculi at upper poles of both kidneys. Cyst with dependent stones upper pole left kidney, extending beyond imaged field, visualized portion measuring 3.4 x 3.3 cm image 110. Numerous calcified lymph nodes at left hilum and AP window. Pulmonary arteries patent. No evidence of pulmonary embolism. Lungs are emphysematous with minimal scattered peribronchial thickening. Minimal peripheral scarring or pleural thickening lateral aspect right upper lobe. Mucus within bronchi in left lower lobe. No definite pulmonary infiltrate, pleural effusion, or pneumothorax. No acute osseous findings.  Review of the MIP images confirms the above findings.  IMPRESSION: No evidence of pulmonary embolism. Emphysematous changes and minimal bronchitic changes with mucus in scattered left lower lobe bronchi. Nonobstructing bilateral renal calculi. Complicated cyst upper pole left kidney, containing internal calcifications dependently, incompletely imaged/characterized; unless this has been previously evaluated, recommend non emergent follow-up MR imaging with and without contrast to characterize.  Original Report Authenticated By: Lollie Marrow, M.D.   Dg Chest Portable 1 View  11/14/2011  *RADIOLOGY REPORT*  Clinical Data:  Chest pain, smoker  PORTABLE CHEST - 1 VIEW  Comparison: Portable exam 0803 hours compared to 09/16/2009  Findings: Normal heart size, mediastinal contours, and pulmonary vascularity. Emphysematous changes with minimal chronic peribronchial thickening. No pulmonary infiltrate, pleural effusion or pneumothorax. No acute osseous findings. Tip of right costophrenic angle excluded.  IMPRESSION: Changes of COPD/chronic bronchitis. No acute abnormalities.  Original Report Authenticated By: Lollie Marrow, M.D.       1. Chest wall pain      Date: 11/14/2011  Rate: 75  Rhythm: normal sinus rhythm  With pjc  QRS Axis: normal  Intervals: normal  ST/T Wave abnormalities: normal  Conduction  Disutrbances:none  Narrative Interpretation:   Old EKG Reviewed: none available    MDM  Chest wall pain      The chart  was scribed for me under my direct supervision.  I personally performed the history, physical, and medical decision making and all procedures in the evaluation of this patient.Raymond Lennert, MD 11/14/11 1213

## 2011-11-14 NOTE — ED Notes (Signed)
Patient's family requesting to see MD. Patient's family very upset that patient is not in [place] right now, stating "he's having symptoms of heart attack". Lanora Manis, RN tried to reassure family member that we are monitoring patient and waiting for lab results to be completed, but family stating they want to talk to MD. Dr Estell Harpin made aware and will be in to speak with pt and family soon.

## 2011-11-14 NOTE — ED Notes (Signed)
Patient stating the percocet did not help his pain at all. Rates pain 6/10 on NPS. Dr Estell Harpin made aware and will be in to reassess patient. Awaiting further orders.

## 2011-11-14 NOTE — ED Notes (Signed)
Dr Estell Harpin at bedside to discuss plan of care with patient.

## 2011-11-14 NOTE — ED Notes (Signed)
Patient with c/o chest pain that started 0000 today. Patient describes pain as a pressure that radiates to both arms and upper back. Reports shortness of breath, denies n/v.

## 2011-11-20 ENCOUNTER — Other Ambulatory Visit: Payer: Self-pay | Admitting: Family Medicine

## 2011-11-20 DIAGNOSIS — N281 Cyst of kidney, acquired: Secondary | ICD-10-CM

## 2011-11-26 ENCOUNTER — Ambulatory Visit: Payer: Managed Care, Other (non HMO) | Admitting: Cardiology

## 2011-11-27 ENCOUNTER — Ambulatory Visit (HOSPITAL_COMMUNITY)
Admission: RE | Admit: 2011-11-27 | Discharge: 2011-11-27 | Disposition: A | Discharge status: Home / Self Care | Payer: Managed Care, Other (non HMO) | Source: Ambulatory Visit | Attending: Family Medicine | Admitting: Family Medicine

## 2011-11-27 DIAGNOSIS — N281 Cyst of kidney, acquired: Secondary | ICD-10-CM

## 2011-11-27 DIAGNOSIS — N289 Disorder of kidney and ureter, unspecified: Secondary | ICD-10-CM | POA: Insufficient documentation

## 2011-11-27 MED ORDER — GADOBENATE DIMEGLUMINE 529 MG/ML IV SOLN
15.0000 mL | Freq: Once | INTRAVENOUS | Status: AC | PRN
  Administered 2011-11-27: 15 mL via INTRAVENOUS

## 2011-11-30 ENCOUNTER — Encounter: Payer: Self-pay | Admitting: Cardiology

## 2011-12-03 ENCOUNTER — Encounter: Payer: Self-pay | Admitting: Cardiology

## 2011-12-03 ENCOUNTER — Other Ambulatory Visit: Payer: Self-pay | Admitting: Cardiology

## 2011-12-03 ENCOUNTER — Ambulatory Visit (INDEPENDENT_AMBULATORY_CARE_PROVIDER_SITE_OTHER): Payer: Managed Care, Other (non HMO) | Admitting: Cardiology

## 2011-12-03 DIAGNOSIS — M79609 Pain in unspecified limb: Secondary | ICD-10-CM

## 2011-12-03 DIAGNOSIS — IMO0001 Reserved for inherently not codable concepts without codable children: Secondary | ICD-10-CM | POA: Insufficient documentation

## 2011-12-03 DIAGNOSIS — M79604 Pain in right leg: Secondary | ICD-10-CM

## 2011-12-03 DIAGNOSIS — R030 Elevated blood-pressure reading, without diagnosis of hypertension: Secondary | ICD-10-CM

## 2011-12-03 DIAGNOSIS — R079 Chest pain, unspecified: Secondary | ICD-10-CM | POA: Insufficient documentation

## 2011-12-03 DIAGNOSIS — Z720 Tobacco use: Secondary | ICD-10-CM | POA: Insufficient documentation

## 2011-12-03 DIAGNOSIS — F17200 Nicotine dependence, unspecified, uncomplicated: Secondary | ICD-10-CM

## 2011-12-03 NOTE — Assessment & Plan Note (Signed)
Possibly claudication based on description. Lower extremity arterial Dopplers with ABIs to be obtained.

## 2011-12-03 NOTE — Progress Notes (Signed)
Clinical Summary [name] is a 60 y.o.male referred by Dr. Gerda Diss for cardiology consultation. He reports no personal history of obstructive CAD or myocardial infarction. Review of recent history finds an ER visit in November secondary to chest pain. At that time his ECG was nonspecific and cardiac markers were normal. He was referred for a CT scan of the chest that did not demonstrate any pulmonary embolus, however there was evidence of coronary artery calcification.  He reports undergoing a treadmill test many years ago. Has had no recent cardiac testing other than that outlined above.  He reports a dull chest pain centered in the left side of his chest, also involving the shoulder. He states that this has been present almost constantly to mild degree over the last 5 weeks. It is not worsened with exertion, not associated with cough or shortness of breath. No palpitations. He recalls no specific injury or specific event prior to the onset of symptoms.  He states that when he walks, he has no increase in chest discomfort or shortness of breath. He is bothered by intermittent right leg stiffness and pain when he ambulates, better when he stops walking.  No Known Allergies  Medication list reviewed.  Past Medical History  Diagnosis Date  . Pancreatitis, recurrent   . Nephrolithiasis   . COPD (chronic obstructive pulmonary disease)   . Erectile dysfunction   . Impaired glucose tolerance     Social History [name] reports that he has been smoking Cigarettes.  He has been smoking about 1.5 packs per day. He has never used smokeless tobacco. [name] reports that he does not drink alcohol.  Review of Systems No palpitations or syncope. Stable appetite. Otherwise negative except as outlined above.  Physical Examination Filed Vitals:   12/03/11 1007  BP: 171/98  Pulse: 63  Resp: 18   Normally developed appearing male in no acute distress. HEENT: Conjunctiva and lids normal,  oropharynx with poor dentition. Neck: Supple, no elevated JVP or carotid bruits, no stomach. Lungs: Clear with diminished breath sounds, no wheezing or labored breathing. Cardiac: Regular rate and rhythm, no gallop or rub. Abdomen: Soft, nontender, no bruits. Skin: Warm and dry. Extremities: No pitting edema, distal pulses 1-2+. Musculoskeletal: No kyphosis. Neuropsychiatric: Patient alert and oriented x3, affect appropriate.   ECG Recent ECG from late November showed sinus rhythm with PAC, small R prime in lead V1.  Problem List and Plan

## 2011-12-03 NOTE — Assessment & Plan Note (Signed)
No reported history of hypertension. He states he does not take any medications regularly. I discussed his blood pressure, and the likelihood that he has essential hypertension at baseline. I asked him to consider whether he might take any regular medication for this, and we can discuss this further at his followup.

## 2011-12-03 NOTE — Assessment & Plan Note (Signed)
Fairly prolonged, atypical in description. ECG is nonspecific. Cardiac markers from ER visit reviewed and normal. CT scan of the chest did demonstrate evidence of coronary artery calcifications, and risk factors include age and gender, probable hypertension, impaired glucose tolerance. Plan is to screen for underlying obstructive CAD with an exercise echocardiogram. Followup arranged.

## 2011-12-03 NOTE — Assessment & Plan Note (Signed)
Smoking cessation would be of benefit.

## 2011-12-03 NOTE — Patient Instructions (Signed)
Your physician recommends that you schedule a follow-up appointment in: 3 months   Your physician has requested that you have a stress echocardiogram. For further information please visit [email or web address] Please follow instruction sheet as given.   Your physician has requested that you have a lower extremity arterial duplex. This test is an ultrasound of the arteries in the legs or arms. It looks at arterial blood flow in the legs and arms. Allow one hour for Lower and Upper Arterial scans. There are no restrictions or special instructions

## 2011-12-19 ENCOUNTER — Ambulatory Visit (HOSPITAL_COMMUNITY)
Admission: RE | Admit: 2011-12-19 | Discharge: 2011-12-19 | Disposition: A | Discharge status: Home / Self Care | Payer: Managed Care, Other (non HMO) | Source: Ambulatory Visit | Attending: Cardiology | Admitting: Cardiology

## 2011-12-19 ENCOUNTER — Ambulatory Visit (HOSPITAL_COMMUNITY): Payer: Managed Care, Other (non HMO)

## 2011-12-19 ENCOUNTER — Encounter (HOSPITAL_COMMUNITY): Payer: Self-pay | Admitting: Cardiology

## 2011-12-19 DIAGNOSIS — R079 Chest pain, unspecified: Secondary | ICD-10-CM | POA: Insufficient documentation

## 2011-12-19 DIAGNOSIS — I10 Essential (primary) hypertension: Secondary | ICD-10-CM | POA: Insufficient documentation

## 2011-12-19 DIAGNOSIS — IMO0001 Reserved for inherently not codable concepts without codable children: Secondary | ICD-10-CM

## 2011-12-19 DIAGNOSIS — M79604 Pain in right leg: Secondary | ICD-10-CM

## 2011-12-19 NOTE — Progress Notes (Signed)
*  PRELIMINARY RESULTS* Echocardiogram Echocardiogram Stress Test has been performed.  Conrad [place] 12/19/2011, 11:24 AM

## 2011-12-19 NOTE — Progress Notes (Signed)
Stress Lab Nurses Notes - Raymond Walker  Raymond Walker 12/19/2011  Reason for doing test: Chest Pain Type of test: Stress Echo Nurse performing test: Parke Poisson, RN Nuclear Medicine Tech: Not Applicable Echo Tech: Karrie Doffing MD performing test: R. Dietrich Pates Family MD:  Lilyan Punt Test explained and consent signed: yes IV started: No IV started Symptoms: SOB & Fatigue Treatment/Intervention: None Reason test stopped: fatigue After recovery IV was: NA Patient to return to Nuc. Med at : NA Patient discharged: Home Patient's Condition upon discharge was: stable Comments: During test BP 158/80 & HR 118.  Recovery BP 150/82 & HR 77.  Symptoms resolved in recovery. Erskine Speed T

## 2011-12-26 ENCOUNTER — Ambulatory Visit: Payer: Managed Care, Other (non HMO) | Admitting: Cardiology

## 2012-01-08 ENCOUNTER — Emergency Department (HOSPITAL_COMMUNITY)
Admission: EM | Admit: 2012-01-08 | Discharge: 2012-01-08 | Disposition: A | Discharge status: Home / Self Care | Payer: Managed Care, Other (non HMO) | Attending: Emergency Medicine | Admitting: Emergency Medicine

## 2012-01-08 ENCOUNTER — Encounter (HOSPITAL_COMMUNITY): Payer: Self-pay | Admitting: Emergency Medicine

## 2012-01-08 ENCOUNTER — Other Ambulatory Visit: Payer: Self-pay

## 2012-01-08 DIAGNOSIS — F17200 Nicotine dependence, unspecified, uncomplicated: Secondary | ICD-10-CM | POA: Insufficient documentation

## 2012-01-08 DIAGNOSIS — J449 Chronic obstructive pulmonary disease, unspecified: Secondary | ICD-10-CM | POA: Insufficient documentation

## 2012-01-08 DIAGNOSIS — R079 Chest pain, unspecified: Secondary | ICD-10-CM | POA: Insufficient documentation

## 2012-01-08 DIAGNOSIS — J4489 Other specified chronic obstructive pulmonary disease: Secondary | ICD-10-CM | POA: Insufficient documentation

## 2012-01-08 DIAGNOSIS — Z87442 Personal history of urinary calculi: Secondary | ICD-10-CM | POA: Insufficient documentation

## 2012-01-08 DIAGNOSIS — Z9852 Vasectomy status: Secondary | ICD-10-CM | POA: Insufficient documentation

## 2012-01-08 DIAGNOSIS — K861 Other chronic pancreatitis: Secondary | ICD-10-CM | POA: Insufficient documentation

## 2012-01-08 DIAGNOSIS — Z9889 Other specified postprocedural states: Secondary | ICD-10-CM | POA: Insufficient documentation

## 2012-01-08 DIAGNOSIS — K219 Gastro-esophageal reflux disease without esophagitis: Secondary | ICD-10-CM | POA: Insufficient documentation

## 2012-01-08 LAB — POCT I-STAT TROPONIN I: Troponin i, poc: 0.01 ng/mL (ref 0.00–0.08)

## 2012-01-08 MED ORDER — OMEPRAZOLE 20 MG PO CPDR: 20.0000 mg | DELAYED_RELEASE_CAPSULE | Freq: Every day | ORAL | Status: DC

## 2012-01-08 MED ORDER — GI COCKTAIL ~~LOC~~
30.0000 mL | Freq: Once | ORAL | Status: AC
  Administered 2012-01-08: 30 mL via ORAL
  Filled 2012-01-08: qty 30

## 2012-01-08 NOTE — ED Provider Notes (Signed)
History    This chart was scribed for Nelia Shi, MD, MD by Smitty Pluck. The patient was seen in room [facility] and the patient's care was started at 11:39AM.   CSN: [id number]  Arrival date & time 01/08/12  1120   First MD Initiated Contact with Patient 01/08/12 1137      Chief Complaint  Patient presents with  . Chest Pain  . Nausea    (Consider location/radiation/quality/duration/timing/severity/associated sxs/prior treatment) Patient is a 61 y.o. male presenting with chest pain. The history is provided by the patient.  Chest Pain    Raymond Walker is a 61 y.o. male who presents to the Emergency Department complaining of moderate middle chest pain onset today. The pain has been constant without radiation. Pt reports the pain started after he ate breakfast. He states he has nausea. Pt was in ED a couple of days ago for stress test and the results were normal. Pt has a history of COPD and pancreatitis.   Past Medical History  Diagnosis Date  . Pancreatitis, recurrent   . Nephrolithiasis   . COPD (chronic obstructive pulmonary disease)   . Erectile dysfunction   . Impaired glucose tolerance     Past Surgical History  Procedure Date  . Cholecystectomy   . Orchiectomy   . Vasectomy     Family History  Problem Relation Age of Onset  . Hypertension Father   . Coronary artery disease Father   . Coronary artery disease Mother   . Cancer Mother     Breast    History  Substance Use Topics  . Smoking status: Current Everyday Smoker -- 1.5 packs/day    Types: Cigarettes  . Smokeless tobacco: Never Used  . Alcohol Use: No      Review of Systems  Cardiovascular: Positive for chest pain.  All other systems reviewed and are negative.   10 Systems reviewed and are negative for acute change except as noted in the HPI.  Allergies  Review of patient's allergies indicates no known allergies.  Home Medications   Current Outpatient Rx  Name Route Sig Dispense  Refill  . OMEPRAZOLE 20 MG PO CPDR Oral Take 1 capsule (20 mg total) by mouth daily. 30 capsule 2    BP 137/82  Pulse 74  Temp(Src) 98.7 F (37.1 C) (Oral)  Resp 20  Ht 6' (1.829 m)  Wt 168 lb (76.204 kg)  BMI 22.78 kg/m2  SpO2 97%  Physical Exam  Nursing note and vitals reviewed. Constitutional: He is oriented to person, place, and time. He appears well-developed and well-nourished. No distress.  HENT:  Head: Normocephalic and atraumatic.  Eyes: EOM are normal. Pupils are equal, round, and reactive to light.  Neck: Normal range of motion. Neck supple. No tracheal deviation present.  Cardiovascular: Normal rate, regular rhythm and normal heart sounds.          Date: 01/08/2012  Rate: 79  Rhythm: normal sinus rhythm  QRS Axis: normal  Intervals: normal  ST/T Wave abnormalities: normal  Conduction Disutrbances:none:   Old EKG Reviewed: unchanged     Pulmonary/Chest: Effort normal and breath sounds normal. No respiratory distress.  Abdominal: Soft. He exhibits no distension.  Musculoskeletal: Normal range of motion.  Neurological: He is alert and oriented to person, place, and time.  Skin: Skin is warm and dry.  Psychiatric: He has a normal mood and affect. His behavior is normal.    ED Course  Procedures (including critical care time) After treatment  in the ED the patient feels back to baseline and wants to go home.  DIAGNOSTIC STUDIES: Oxygen Saturation is 96% on room air, normal by my interpretation.    COORDINATION OF CARE:  11:39AM EDP discusses treatment course in ED with pt.  11:45AM EDP ordered medication: GI cocktail    Labs Reviewed  POCT I-STAT TROPONIN I  I-STAT TROPONIN I   No results found.   1. GERD (gastroesophageal reflux disease)       MDM  I personally performed the services described in this documentation, which was scribed in my presence. The recorded information has been reviewed and considered.   Nelia Shi, MD 01/09/12  [phone]

## 2012-01-08 NOTE — ED Notes (Signed)
Patient with no complaints at this time. Respirations even and unlabored. Skin warm/dry. Discharge instructions reviewed with patient at this time. Patient given opportunity to voice concerns/ask questions. IV removed per policy and band-aid applied to site. Patient discharged at this time and left Emergency Department with steady gait.  

## 2012-01-08 NOTE — ED Notes (Signed)
Pt c/o mid chest pain that started after eating this am. Pt states he feels nauseous.

## 2012-01-09 NOTE — H&P (Signed)
NAME:  Raymond Walker, Raymond Walker                ACCOUNT NO.:  [id number]  MEDICAL RECORD NO.:  [id number]  LOCATION:  [facility]                         FACILITY:  [facility]  PHYSICIAN:  Darianna Amy G. Renard Matter, MD   DATE OF BIRTH:  1951-10-06  DATE OF ADMISSION:  01/08/2012 DATE OF DISCHARGE:  01/22/2013LH                             HISTORY & PHYSICAL   A 61 year old male patient was admitted to the hospital with increasing dyspnea.  He did not have any pain, fever, or nausea.  He does have a history of diabetes, COPD, hypertension, CHF, coronary artery disease, aortic stenosis.  Does not use oxygen at home.  Chest x-ray showed mild cardiomegaly.  Lungs remain clear.  PERTINENT LABORATORY STUDIES:  BNP was elevated at 748.6, glucose of 146.  CBC:  WBC 9500 with hemoglobin 14.6, hematocrit 43.1.  O2 sats on admission 97%.  Nitroglycerin patch applied.  He is feeling better following this.  The patient was subsequently admitted.  SOCIAL HISTORY:  The patient was a former cigarette smoker, 2.5 packs a day for 30 years.  Does not smoke and does not use alcohol.  FAMILY HISTORY:  Positive for cancer, diabetes, coronary artery disease.  PAST MEDICAL HISTORY:  Diabetes mellitus type 2, asthma, COPD with emphysema, hypertension, hyperlipidemia, diverticulosis, congestive heart failure, coronary artery disease, aortic stenosis, mitral insufficiency, old MI, BPH, bronchitis.  SURGICAL HISTORY:  Partial colectomy, cardiac catheterization with ejection fraction of 40-45%.  Cardiovascular stress test, ejection fraction 55%.  Surgery on left elbow, left shoulder, lower back.  REVIEW OF SYSTEMS:  HEENT:  Negative.  CARDIOPULMONARY:  The patient has cough productive of some chest pain and dyspnea.  GI:  No bowel irregularity or bleeding.  GU:  No dysuria or hematuria.  ALLERGIES:  CLONAZEPAM and PENICILLIN.  HOME MEDICATION LIST: 1. Albuterol 2 puffs every 6 hours. 2. Allopurinol 300 mg daily. 3. Aspirin 325 mg  daily. 4. Budesonide/formoterol fumarate 160/4.5, 2 puffs b.i.d. 5. Furosemide 40 mg tablets, 80 mg t.i.d. 6. Insulin glargine 15 units daily at bedtime. 7. Sliding scale insulin lispro. 8. Klor-Con 20 mg twice a day. 9. Metoprolol 25 mg daily. 10.Pravastatin 40 mg daily. 11.Valsartan 160 mg 2 tabs daily. 12.Oxymetazoline hydrochloride 0.05%, 2 sprays in the nose twice a     day.  PHYSICAL EXAMINATION:  GENERAL:  Alert but uncomfortable male. VITAL SIGNS:  Blood pressure 160/75, pulse 72, temp 97.6. HEENT:  Eyes:  PERRLA.  TMs negative.  Oropharynx benign. NECK:  Supple.  No JVD or thyroid abnormalities. LUNGS:  Rhonchi over posterior lung field. HEART:  Regular rhythm. ABDOMEN:  No palpable organs or masses.  No organomegaly. EXTREMITIES:  Free of edema.  ASSESSMENT:  The patient was admitted with dyspnea.  He does have congestive heart failure-diastolic as well as moderate aortic stenosis, possible congestive heart failure Possible CHF.  The patient does have known diabetes insulin dependent and chronic obstructive pulmonary disease with emphysema, hypertension.  PLAN:  To continue nebulizer treatments.  Continue IV Lasix, insulin, and Lantus 40 units daily and sliding scale NovoLog insulin.  We will obtain cardiology consult.     Ahliya Glatt G. Renard Matter, MD     AGM/MEDQ  D:  01/09/2012  T:  01/09/2012  Job:  [id number]

## 2012-06-24 ENCOUNTER — Encounter (HOSPITAL_COMMUNITY): Payer: Self-pay | Admitting: Emergency Medicine

## 2012-06-24 ENCOUNTER — Emergency Department (HOSPITAL_COMMUNITY)
Admission: EM | Admit: 2012-06-24 | Discharge: 2012-06-24 | Disposition: A | Discharge status: Home / Self Care | Payer: Managed Care, Other (non HMO) | Attending: Emergency Medicine | Admitting: Emergency Medicine

## 2012-06-24 DIAGNOSIS — H16139 Photokeratitis, unspecified eye: Secondary | ICD-10-CM | POA: Insufficient documentation

## 2012-06-24 DIAGNOSIS — Z87442 Personal history of urinary calculi: Secondary | ICD-10-CM | POA: Insufficient documentation

## 2012-06-24 DIAGNOSIS — H5710 Ocular pain, unspecified eye: Secondary | ICD-10-CM | POA: Insufficient documentation

## 2012-06-24 DIAGNOSIS — J4489 Other specified chronic obstructive pulmonary disease: Secondary | ICD-10-CM | POA: Insufficient documentation

## 2012-06-24 DIAGNOSIS — J449 Chronic obstructive pulmonary disease, unspecified: Secondary | ICD-10-CM | POA: Insufficient documentation

## 2012-06-24 DIAGNOSIS — F17200 Nicotine dependence, unspecified, uncomplicated: Secondary | ICD-10-CM | POA: Insufficient documentation

## 2012-06-24 MED ORDER — KETOROLAC TROMETHAMINE 0.5 % OP SOLN
1.0000 [drp] | Freq: Four times a day (QID) | OPHTHALMIC | Status: DC
  Filled 2012-06-24: qty 5

## 2012-06-24 MED ORDER — OXYCODONE-ACETAMINOPHEN 5-325 MG PO TABS
2.0000 | ORAL_TABLET | Freq: Once | ORAL | Status: AC
  Administered 2012-06-24: 2 via ORAL
  Filled 2012-06-24: qty 2

## 2012-06-24 MED ORDER — FLUORESCEIN SODIUM 1 MG OP STRP
ORAL_STRIP | OPHTHALMIC | Status: AC
  Filled 2012-06-24: qty 1

## 2012-06-24 MED ORDER — TETRACAINE HCL 0.5 % OP SOLN
OPHTHALMIC | Status: AC
  Administered 2012-06-24: 1 [drp] via OPHTHALMIC
  Filled 2012-06-24: qty 2

## 2012-06-24 MED ORDER — TETRACAINE HCL 0.5 % OP SOLN
1.0000 [drp] | Freq: Once | OPHTHALMIC | Status: AC
Start: 2012-06-24 — End: 2012-06-24
  Administered 2012-06-24: 1 [drp] via OPHTHALMIC

## 2012-06-24 MED ORDER — ERYTHROMYCIN 5 MG/GM OP OINT
TOPICAL_OINTMENT | Freq: Four times a day (QID) | OPHTHALMIC | Status: DC
  Administered 2012-06-24: 05:00:00 via OPHTHALMIC
  Filled 2012-06-24: qty 3.5

## 2012-06-24 MED ORDER — OXYCODONE-ACETAMINOPHEN 5-325 MG PO TABS: 1.0000 | ORAL_TABLET | ORAL | Status: AC | PRN

## 2012-06-24 MED ORDER — ERYTHROMYCIN 5 MG/GM OP OINT: TOPICAL_OINTMENT | OPHTHALMIC | Status: AC

## 2012-06-24 NOTE — ED Provider Notes (Signed)
History     CSN: [id number]  Arrival date & time 06/24/12  0346   First MD Initiated Contact with Patient 06/24/12 0404      Chief Complaint  Patient presents with  . Eye Injury    (Consider location/radiation/quality/duration/timing/severity/associated sxs/prior treatment) HPI History provided by patient. 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 No pain at that time. Went home and went to sleep and woke up with severe pain in both eyes. Has history of well there is burn in the past and this feels the same. Eyes are watering with blurry vision. No headache. No trauma otherwise. No discharge. No fevers or chills. Pain is severe and sharp in quality without radiation. Past Medical History  Diagnosis Date  . Pancreatitis, recurrent   . Nephrolithiasis   . COPD (chronic obstructive pulmonary disease)   . Erectile dysfunction   . Impaired glucose tolerance     Past Surgical History  Procedure Date  . Cholecystectomy   . Orchiectomy   . Vasectomy     Family History  Problem Relation Age of Onset  . Hypertension Father   . Coronary artery disease Father   . Coronary artery disease Mother   . Cancer Mother     Breast    History  Substance Use Topics  . Smoking status: Current Everyday Smoker -- 1.5 packs/day    Types: Cigarettes  . Smokeless tobacco: Never Used  . Alcohol Use: No      Review of Systems  Constitutional: Negative for fever and chills.  HENT: Negative for neck pain and neck stiffness.   Eyes: Positive for pain and redness.  Respiratory: Negative for shortness of breath.   Cardiovascular: Negative for chest pain.  Gastrointestinal: Negative for abdominal pain.  Genitourinary: Negative for dysuria.  Musculoskeletal: Negative for back pain.  Skin: Negative for rash.  Neurological: Negative for headaches.  All other systems reviewed and are negative.    Allergies  Review of patient's allergies indicates no  known allergies.  Home Medications   Current Outpatient Rx  Name Route Sig Dispense Refill  . OMEPRAZOLE 20 MG PO CPDR Oral Take 1 capsule (20 mg total) by mouth daily. 30 capsule 2    BP 156/97  Pulse 77  Temp 97.5 F (36.4 C) (Oral)  Resp 20  Ht 6' (1.829 m)  Wt 172 lb (78.019 kg)  BMI 23.33 kg/m2  SpO2 98%  Physical Exam  Constitutional: He is oriented to person, place, and time. He appears well-developed and well-nourished.  HENT:  Head: Normocephalic and atraumatic.  Eyes: EOM are normal. Pupils are equal, round, and reactive to light.       Bilateral photophobia with bilateral conjunctival injection. No foreign bodies visualized. Bilateral tearing present. No periorbital swelling, erythema. Lids and lashes clipped and clear.  Neck: Trachea normal. Neck supple. No thyromegaly present.  Cardiovascular: Normal rate, regular rhythm, S1 normal, S2 normal and normal pulses.     No systolic murmur is present   No diastolic murmur is present  Pulses:      Radial pulses are 2+ on the right side, and 2+ on the left side.  Pulmonary/Chest: Effort normal and breath sounds normal. He has no wheezes. He has no rhonchi. He has no rales. He exhibits no tenderness.  Abdominal: Soft. Normal appearance and bowel sounds are normal. There is no tenderness. There is no CVA tenderness and negative Murphy's sign.  Musculoskeletal:  BLE:s Calves nontender, no cords or erythema, negative Homans sign  Neurological: He is alert and oriented to person, place, and time. He has normal strength. No cranial nerve deficit or sensory deficit. GCS eye subscore is 4. GCS verbal subscore is 5. GCS motor subscore is 6.  Skin: Skin is warm and dry. No rash noted. He is not diaphoretic.  Psychiatric: His speech is normal.       Cooperative and appropriate    ED Course  Procedures (including critical care time)  Tetracaine eyedrops and fluoro stain with Woods lamp exam - No foreign bodies or dye  uptake.  Pain medications provided. Eyedrops provided. MDM   Presentation consistent with UV keratitis. Medications provided as above. Plan erythromycin ointment, ketorolac eyedrops, work note for tomorrow, ophthalmology referral provided.         Sunnie Nielsen, MD 06/24/12 [phone]

## 2012-06-24 NOTE — ED Notes (Signed)
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 Complaining of burning and decreased vision to eyes bilaterally.

## 2012-09-07 ENCOUNTER — Emergency Department (HOSPITAL_COMMUNITY)
Admission: EM | Admit: 2012-09-07 | Discharge: 2012-09-08 | Disposition: A | Discharge status: Home / Self Care | Payer: Self-pay | Attending: Emergency Medicine | Admitting: Emergency Medicine

## 2012-09-07 ENCOUNTER — Encounter (HOSPITAL_COMMUNITY): Payer: Self-pay

## 2012-09-07 DIAGNOSIS — F17200 Nicotine dependence, unspecified, uncomplicated: Secondary | ICD-10-CM | POA: Insufficient documentation

## 2012-09-07 DIAGNOSIS — L02219 Cutaneous abscess of trunk, unspecified: Secondary | ICD-10-CM | POA: Insufficient documentation

## 2012-09-07 DIAGNOSIS — K859 Acute pancreatitis without necrosis or infection, unspecified: Secondary | ICD-10-CM | POA: Insufficient documentation

## 2012-09-07 DIAGNOSIS — L0291 Cutaneous abscess, unspecified: Secondary | ICD-10-CM

## 2012-09-07 DIAGNOSIS — J4489 Other specified chronic obstructive pulmonary disease: Secondary | ICD-10-CM | POA: Insufficient documentation

## 2012-09-07 DIAGNOSIS — J449 Chronic obstructive pulmonary disease, unspecified: Secondary | ICD-10-CM | POA: Insufficient documentation

## 2012-09-07 DIAGNOSIS — L0390 Cellulitis, unspecified: Secondary | ICD-10-CM

## 2012-09-07 DIAGNOSIS — L03319 Cellulitis of trunk, unspecified: Secondary | ICD-10-CM | POA: Insufficient documentation

## 2012-09-07 DIAGNOSIS — W57XXXA Bitten or stung by nonvenomous insect and other nonvenomous arthropods, initial encounter: Secondary | ICD-10-CM

## 2012-09-07 NOTE — ED Notes (Signed)
Boils/abcess to upper middle back worse over past several days,

## 2012-09-08 LAB — CBC WITH DIFFERENTIAL/PLATELET
Basophils Absolute: 0 10*3/uL (ref 0.0–0.1)
Basophils Relative: 0 % (ref 0–1)
Eosinophils Absolute: 0.1 10*3/uL (ref 0.0–0.7)
Eosinophils Relative: 1 % (ref 0–5)
HCT: 43.6 % (ref 39.0–52.0)
Hemoglobin: 15 g/dL (ref 13.0–17.0)
Lymphocytes Relative: 19 % (ref 12–46)
Lymphs Abs: 1.9 10*3/uL (ref 0.7–4.0)
MCH: 31.1 pg (ref 26.0–34.0)
MCHC: 34.4 g/dL (ref 30.0–36.0)
MCV: 90.5 fL (ref 78.0–100.0)
Monocytes Absolute: 0.8 10*3/uL (ref 0.1–1.0)
Monocytes Relative: 8 % (ref 3–12)
Neutro Abs: 7 10*3/uL (ref 1.7–7.7)
Neutrophils Relative %: 72 % (ref 43–77)
Platelets: 142 10*3/uL — ABNORMAL LOW (ref 150–400)
RBC: 4.82 MIL/uL (ref 4.22–5.81)
RDW: 13.2 % (ref 11.5–15.5)
WBC: 9.8 10*3/uL (ref 4.0–10.5)

## 2012-09-08 MED ORDER — HYDROCODONE-ACETAMINOPHEN 5-325 MG PO TABS: 1.0000 | ORAL_TABLET | ORAL | Status: AC | PRN

## 2012-09-08 MED ORDER — DEXTROSE 5 % IV SOLN
1.0000 g | Freq: Once | INTRAVENOUS | Status: AC
  Administered 2012-09-08: 1 g via INTRAVENOUS
  Filled 2012-09-08: qty 10

## 2012-09-08 MED ORDER — LIDOCAINE HCL (PF) 1 % IJ SOLN
2.0000 mL | Freq: Once | INTRAMUSCULAR | Status: AC
  Administered 2012-09-08: 01:00:00
  Filled 2012-09-08: qty 5

## 2012-09-08 MED ORDER — VANCOMYCIN HCL IN DEXTROSE 1-5 GM/200ML-% IV SOLN
1000.0000 mg | Freq: Once | INTRAVENOUS | Status: AC
  Administered 2012-09-08: 1000 mg via INTRAVENOUS
  Filled 2012-09-08: qty 200

## 2012-09-08 MED ORDER — HYDROCODONE-ACETAMINOPHEN 5-325 MG PO TABS
2.0000 | ORAL_TABLET | Freq: Once | ORAL | Status: AC
  Administered 2012-09-08: 2 via ORAL
  Filled 2012-09-08: qty 2

## 2012-09-08 MED ORDER — DOXYCYCLINE HYCLATE 100 MG PO CAPS: 100.0000 mg | ORAL_CAPSULE | Freq: Two times a day (BID) | ORAL | Status: DC

## 2012-09-08 NOTE — ED Provider Notes (Signed)
   I was asked by the EDP, Dr. Colon Branch, to evaluate patient for possible I&D of abscess of the mid upper back.  This was my only involvement in this patient's care.    INCISION AND DRAINAGE Performed by: Maxwell Caul. Consent: Verbal consent obtained. Risks and benefits: risks, benefits and alternatives were discussed Type: abscess  Body area: mid upper back Anesthesia: local infiltration  Local anesthetic: lidocaine 1% w/o epinephrine  Anesthetic total: 3 ml  Complexity: complex Blunt dissection to break up loculations  Drainage: purulent  Drainage amount: moderate  Packing material: 1/4 in iodoform gauze  Patient tolerance: Patient tolerated the procedure well with no immediate complications.    Patient agrees to return here in 1-2 days for recheck and packing removal.    Rosealyn Little L. [place], [place] 09/08/12 [phone]

## 2012-09-08 NOTE — ED Provider Notes (Signed)
History     CSN: [id number]  Arrival date & time 09/07/12  2341   First MD Initiated Contact with Patient 09/07/12 2349      Chief Complaint  Patient presents with  . Recurrent Skin Infections    (Consider location/radiation/quality/duration/timing/severity/associated sxs/prior treatment) HPI Raymond Walker is a 61 y.o. male who presents to the Emergency Department complaining of infected insect bite to middle of back present for several days with worsening swelling, pain, redness, no drainage. Has been taking ibuprofen and placing hot compresses with no relief.   PCP Dr. Gerda Diss  Past Medical History  Diagnosis Date  . Pancreatitis, recurrent   . Nephrolithiasis   . COPD (chronic obstructive pulmonary disease)   . Erectile dysfunction   . Impaired glucose tolerance     Past Surgical History  Procedure Date  . Cholecystectomy   . Orchiectomy   . Vasectomy     Family History  Problem Relation Age of Onset  . Hypertension Father   . Coronary artery disease Father   . Coronary artery disease Mother   . Cancer Mother     Breast    History  Substance Use Topics  . Smoking status: Current Every Day Smoker -- 1.5 packs/day    Types: Cigarettes  . Smokeless tobacco: Never Used  . Alcohol Use: No      Review of Systems  Constitutional: Negative for fever.       10 Systems reviewed and are negative for acute change except as noted in the HPI.  HENT: Negative for congestion.   Eyes: Negative for discharge and redness.  Respiratory: Negative for cough and shortness of breath.   Cardiovascular: Negative for chest pain.  Gastrointestinal: Negative for vomiting and abdominal pain.  Musculoskeletal: Negative for back pain.  Skin: Negative for rash.       Infected insect bite  Neurological: Negative for syncope, numbness and headaches.  Psychiatric/Behavioral:       No behavior change.    Allergies  Review of patient's allergies indicates no known  allergies.  Home Medications   Current Outpatient Rx  Name Route Sig Dispense Refill  . OMEPRAZOLE 20 MG PO CPDR Oral Take 1 capsule (20 mg total) by mouth daily. 30 capsule 2    BP 138/79  Temp 98.9 F (37.2 C) (Oral)  Resp 18  Ht 6' (1.829 m)  Wt 180 lb (81.647 kg)  BMI 24.41 kg/m2  SpO2 99%  Physical Exam  Nursing note and vitals reviewed. Constitutional: He is oriented to person, place, and time. He appears well-developed and well-nourished.       Awake, alert, nontoxic appearance.  HENT:  Head: Normocephalic and atraumatic.  Eyes: Right eye exhibits no discharge. Left eye exhibits no discharge.  Neck: Neck supple.  Cardiovascular: Normal heart sounds.   Pulmonary/Chest: Effort normal and breath sounds normal. He exhibits no tenderness.  Abdominal: Soft. There is no tenderness. There is no rebound.  Musculoskeletal: Normal range of motion. He exhibits no tenderness.       Baseline ROM, no obvious new focal weakness.  Neurological: He is alert and oriented to person, place, and time. He has normal reflexes.       Mental status and motor strength appears baseline for patient and situation.  Skin: No rash noted.       2 cm x 5 cm raised indurated non fluctuant erythematous lesion to center of back with tenderness to palpation and several small papules with pus surrounding the central  lesion. Surrounding cellulitis extended 15- 25 cm.   Psychiatric: He has a normal mood and affect.    ED Course  Procedures (including critical care time)  Results for orders placed during the hospital encounter of 09/07/12  CBC WITH DIFFERENTIAL      Component Value Range   WBC 9.8  4.0 - 10.5 K/uL   RBC 4.82  4.22 - 5.81 MIL/uL   Hemoglobin 15.0  13.0 - 17.0 g/dL   HCT 40.9  81.1 - 91.4 %   MCV 90.5  78.0 - 100.0 fL   MCH 31.1  26.0 - 34.0 pg   MCHC 34.4  30.0 - 36.0 g/dL   RDW 78.2  95.6 - 21.3 %   Platelets 142 (*) 150 - 400 K/uL   Neutrophils Relative 72  43 - 77 %   Neutro Abs  7.0  1.7 - 7.7 K/uL   Lymphocytes Relative 19  12 - 46 %   Lymphs Abs 1.9  0.7 - 4.0 K/uL   Monocytes Relative 8  3 - 12 %   Monocytes Absolute 0.8  0.1 - 1.0 K/uL   Eosinophils Relative 1  0 - 5 %   Eosinophils Absolute 0.1  0.0 - 0.7 K/uL   Basophils Relative 0  0 - 1 %   Basophils Absolute 0.0  0.0 - 0.1 K/uL   No results found.   No diagnosis found.    MDM  Patient with an infected insect bite to his back with surrounding cellulitis. Mid level completedI&D. He was given Rocephin and Vancomycin. Pt stable in ED with no significant deterioration in condition.The patient appears reasonably screened and/or stabilized for discharge and I doubt any other medical condition or other [facility] requiring further screening, evaluation, or treatment in the ED at this time prior to discharge.  MDM Reviewed: nursing note and vitals Interpretation: labs           Nicoletta Dress. Colon Branch, MD 09/08/12 0865

## 2012-09-08 NOTE — ED Provider Notes (Signed)
Medical screening examination/treatment/procedure(s) were conducted as a shared visit with non-physician practitioner(s) and myself.  I personally evaluated the patient during the encounter  Nicoletta Dress. Colon Branch, MD 09/08/12 1610

## 2012-09-10 ENCOUNTER — Emergency Department (HOSPITAL_COMMUNITY)
Admission: EM | Admit: 2012-09-10 | Discharge: 2012-09-10 | Disposition: A | Discharge status: Home / Self Care | Payer: Self-pay | Attending: Emergency Medicine | Admitting: Emergency Medicine

## 2012-09-10 ENCOUNTER — Encounter (HOSPITAL_COMMUNITY): Payer: Self-pay | Admitting: Emergency Medicine

## 2012-09-10 DIAGNOSIS — Z4801 Encounter for change or removal of surgical wound dressing: Secondary | ICD-10-CM | POA: Insufficient documentation

## 2012-09-10 DIAGNOSIS — L02219 Cutaneous abscess of trunk, unspecified: Secondary | ICD-10-CM | POA: Insufficient documentation

## 2012-09-10 DIAGNOSIS — R109 Unspecified abdominal pain: Secondary | ICD-10-CM | POA: Insufficient documentation

## 2012-09-10 DIAGNOSIS — F17200 Nicotine dependence, unspecified, uncomplicated: Secondary | ICD-10-CM | POA: Insufficient documentation

## 2012-09-10 DIAGNOSIS — L03319 Cellulitis of trunk, unspecified: Secondary | ICD-10-CM | POA: Insufficient documentation

## 2012-09-10 DIAGNOSIS — R0602 Shortness of breath: Secondary | ICD-10-CM | POA: Insufficient documentation

## 2012-09-10 DIAGNOSIS — Z5189 Encounter for other specified aftercare: Secondary | ICD-10-CM

## 2012-09-10 NOTE — ED Provider Notes (Signed)
History     CSN: [id number]  Arrival date & time 09/10/12  1532   First MD Initiated Contact with Patient 09/10/12 1746      Chief Complaint  Patient presents with  . Wound Check    (Consider location/radiation/quality/duration/timing/severity/associated sxs/prior treatment) Patient is a 61 y.o. male presenting with wound check. The history is provided by the patient.  Wound Check  He was treated in the ED 2 to 3 days ago. Previous treatment in the ED includes I&D of abscess. Treatments since wound repair include oral antibiotics. There has been colored discharge from the wound. The redness has improved. The swelling has improved. The pain has improved. He has no difficulty moving the affected extremity or digit.    Past Medical History  Diagnosis Date  . Pancreatitis, recurrent   . Nephrolithiasis   . COPD (chronic obstructive pulmonary disease)   . Erectile dysfunction   . Impaired glucose tolerance     Past Surgical History  Procedure Date  . Cholecystectomy   . Orchiectomy   . Vasectomy     Family History  Problem Relation Age of Onset  . Hypertension Father   . Coronary artery disease Father   . Coronary artery disease Mother   . Cancer Mother     Breast    History  Substance Use Topics  . Smoking status: Current Every Day Smoker -- 1.5 packs/day    Types: Cigarettes  . Smokeless tobacco: Never Used  . Alcohol Use: No      Review of Systems  Constitutional: Negative for activity change.       All ROS Neg except as noted in HPI  HENT: Negative for nosebleeds and neck pain.   Eyes: Negative for photophobia and discharge.  Respiratory: Positive for shortness of breath. Negative for cough and wheezing.   Cardiovascular: Negative for chest pain and palpitations.  Gastrointestinal: Positive for abdominal pain. Negative for blood in stool.  Genitourinary: Negative for dysuria, frequency and hematuria.  Musculoskeletal: Negative for back pain and  arthralgias.  Skin: Negative.   Neurological: Negative for dizziness, seizures and speech difficulty.  Psychiatric/Behavioral: Negative for hallucinations and confusion.    Allergies  Review of patient's allergies indicates no known allergies.  Home Medications   Current Outpatient Rx  Name Route Sig Dispense Refill  . DOXYCYCLINE HYCLATE 100 MG PO CAPS Oral Take 1 capsule (100 mg total) by mouth 2 (two) times daily. 20 capsule 0  . HYDROCODONE-ACETAMINOPHEN 5-325 MG PO TABS Oral Take 1 tablet by mouth every 4 (four) hours as needed for pain. 10 tablet 0  . OMEPRAZOLE 20 MG PO CPDR Oral Take 1 capsule (20 mg total) by mouth daily. 30 capsule 2    BP 134/94  Pulse 74  Temp 98.2 F (36.8 C) (Oral)  Resp 20  Ht 6' (1.829 m)  Wt 178 lb (80.74 kg)  BMI 24.14 kg/m2  SpO2 100%  Physical Exam  Nursing note and vitals reviewed. Constitutional: He is oriented to person, place, and time. He appears well-developed and well-nourished.  Non-toxic appearance.  HENT:  Head: Normocephalic.  Right Ear: Tympanic membrane and external ear normal.  Left Ear: Tympanic membrane and external ear normal.  Eyes: EOM and lids are normal. Pupils are equal, round, and reactive to light.  Neck: Normal range of motion. Neck supple. Carotid bruit is not present.  Cardiovascular: Normal rate, regular rhythm, normal heart sounds, intact distal pulses and normal pulses.   Pulmonary/Chest: Breath sounds normal. No  respiratory distress.       Coarse breath sounds with few rhonchi present.  Abdominal: Soft. Bowel sounds are normal. There is no tenderness. There is no guarding.  Musculoskeletal: Normal range of motion.       The incision and drainage site of the abscess of the upper back is progressing nicely. The area is not hot. There is mild drainage present. The packing remains in place. Mild tenderness to present.  Lymphadenopathy:       Head (right side): No submandibular adenopathy present.       Head  (left side): No submandibular adenopathy present.    He has no cervical adenopathy.  Neurological: He is alert and oriented to person, place, and time. He has normal strength. No cranial nerve deficit or sensory deficit.  Skin: Skin is warm and dry.  Psychiatric: He has a normal mood and affect. His speech is normal.    ED Course  Procedures (including critical care time) Using sterile technique the packing was removed from the abscess of the upper back. Sterile dressing was applied by me.  Labs Reviewed - No data to display No results found.   1. Wound check, abscess       MDM  I have reviewed nursing notes, vital signs, and all appropriate lab and imaging results for this patient. Patient had incision and drainage of an abscess to the upper back about 3 days ago. The packing was removed today, A new dressing applied. Patient advised to finish his doxycycline. Patient is to start warm tub soaks daily until the wound resolves. Patient advised to see his primary physician or return to the emergency department if any signs of infection or changes.       Kathie Dike, [place] 09/10/12 [phone]

## 2012-09-10 NOTE — ED Provider Notes (Signed)
Medical screening examination/treatment/procedure(s) were performed by non-physician practitioner and as supervising physician I was immediately available for consultation/collaboration. Devoria Albe, MD, Armando Gang   Ward Givens, MD 09/10/12 [phone]

## 2012-09-10 NOTE — ED Notes (Signed)
Pt returns to ED for wound recheck. Dressing removed with moderate amount serous drainage noted. Pt states is still taking antibiotic.  Packing noted to be intact at this time. NAD noted.

## 2012-09-10 NOTE — ED Notes (Signed)
Pt here to have packing removed from I&D of back.

## 2013-01-17 DEATH — deceased

## 2015-04-29 ENCOUNTER — Emergency Department (HOSPITAL_COMMUNITY): Payer: Managed Care, Other (non HMO)

## 2015-04-29 ENCOUNTER — Encounter (HOSPITAL_COMMUNITY): Payer: Self-pay | Admitting: *Deleted

## 2015-04-29 ENCOUNTER — Emergency Department (HOSPITAL_COMMUNITY)
Admission: EM | Admit: 2015-04-29 | Discharge: 2015-04-29 | Disposition: A | Discharge status: Home / Self Care | Payer: Managed Care, Other (non HMO) | Attending: Emergency Medicine | Admitting: Emergency Medicine

## 2015-04-29 DIAGNOSIS — Z87442 Personal history of urinary calculi: Secondary | ICD-10-CM | POA: Insufficient documentation

## 2015-04-29 DIAGNOSIS — R61 Generalized hyperhidrosis: Secondary | ICD-10-CM | POA: Insufficient documentation

## 2015-04-29 DIAGNOSIS — Z87448 Personal history of other diseases of urinary system: Secondary | ICD-10-CM | POA: Insufficient documentation

## 2015-04-29 DIAGNOSIS — J441 Chronic obstructive pulmonary disease with (acute) exacerbation: Secondary | ICD-10-CM | POA: Insufficient documentation

## 2015-04-29 DIAGNOSIS — Z79899 Other long term (current) drug therapy: Secondary | ICD-10-CM | POA: Insufficient documentation

## 2015-04-29 DIAGNOSIS — Z8719 Personal history of other diseases of the digestive system: Secondary | ICD-10-CM | POA: Insufficient documentation

## 2015-04-29 DIAGNOSIS — J209 Acute bronchitis, unspecified: Secondary | ICD-10-CM

## 2015-04-29 DIAGNOSIS — Z720 Tobacco use: Secondary | ICD-10-CM

## 2015-04-29 LAB — COMPREHENSIVE METABOLIC PANEL
ALT: 20 U/L (ref 17–63)
AST: 17 U/L (ref 15–41)
Albumin: 3.8 g/dL (ref 3.5–5.0)
Alkaline Phosphatase: 79 U/L (ref 38–126)
Anion gap: 8 (ref 5–15)
BUN: 17 mg/dL (ref 6–20)
CO2: 32 mmol/L (ref 22–32)
Calcium: 9.2 mg/dL (ref 8.9–10.3)
Chloride: 102 mmol/L (ref 101–111)
Creatinine, Ser: 0.93 mg/dL (ref 0.61–1.24)
GFR calc Af Amer: >60 mL/min (ref >60)
GFR calc non Af Amer: >60 mL/min (ref >60)
Glucose, Bld: 116 mg/dL — ABNORMAL HIGH (ref 65–99)
Potassium: 4 mmol/L (ref 3.5–5.1)
Sodium: 142 mmol/L (ref 135–145)
Total Bilirubin: 0.6 mg/dL (ref 0.3–1.2)
Total Protein: 7.9 g/dL (ref 6.5–8.1)

## 2015-04-29 LAB — TROPONIN I: Troponin I: <0.03 ng/mL (ref <0.031)

## 2015-04-29 LAB — CBC WITH DIFFERENTIAL/PLATELET
Basophils Absolute: 0 10*3/uL (ref 0.0–0.1)
Basophils Relative: 0 % (ref 0–1)
Eosinophils Absolute: 0.1 10*3/uL (ref 0.0–0.7)
Eosinophils Relative: 1 % (ref 0–5)
HCT: 44.5 % (ref 39.0–52.0)
Hemoglobin: 14.9 g/dL (ref 13.0–17.0)
Lymphocytes Relative: 23 % (ref 12–46)
Lymphs Abs: 1.8 10*3/uL (ref 0.7–4.0)
MCH: 30.2 pg (ref 26.0–34.0)
MCHC: 33.5 g/dL (ref 30.0–36.0)
MCV: 90.1 fL (ref 78.0–100.0)
Monocytes Absolute: 0.8 10*3/uL (ref 0.1–1.0)
Monocytes Relative: 10 % (ref 3–12)
Neutro Abs: 5.2 10*3/uL (ref 1.7–7.7)
Neutrophils Relative %: 65 % (ref 43–77)
Platelets: 115 10*3/uL — ABNORMAL LOW (ref 150–400)
RBC: 4.94 MIL/uL (ref 4.22–5.81)
RDW: 13.8 % (ref 11.5–15.5)
WBC: 7.9 10*3/uL (ref 4.0–10.5)

## 2015-04-29 MED ORDER — AEROCHAMBER Z-STAT PLUS/MEDIUM MISC
1.0000 | Freq: Once | Status: AC
  Administered 2015-04-29: 1
  Filled 2015-04-29: qty 1

## 2015-04-29 MED ORDER — ALBUTEROL SULFATE HFA 108 (90 BASE) MCG/ACT IN AERS
2.0000 | INHALATION_SPRAY | RESPIRATORY_TRACT | Status: DC | PRN
  Administered 2015-04-29: 2 via RESPIRATORY_TRACT
  Filled 2015-04-29: qty 6.7

## 2015-04-29 MED ORDER — IPRATROPIUM-ALBUTEROL 0.5-2.5 (3) MG/3ML IN SOLN
3.0000 mL | Freq: Once | RESPIRATORY_TRACT | Status: AC
  Administered 2015-04-29: 3 mL via RESPIRATORY_TRACT
  Filled 2015-04-29: qty 3

## 2015-04-29 MED ORDER — DOXYCYCLINE HYCLATE 100 MG PO CAPS: 100.0000 mg | ORAL_CAPSULE | Freq: Two times a day (BID) | ORAL | Status: DC

## 2015-04-29 MED ORDER — DOXYCYCLINE HYCLATE 100 MG PO TABS
100.0000 mg | ORAL_TABLET | Freq: Once | ORAL | Status: AC
  Administered 2015-04-29: 100 mg via ORAL
  Filled 2015-04-29: qty 1

## 2015-04-29 MED ORDER — PREDNISONE 20 MG PO TABS: 20.0000 mg | ORAL_TABLET | Freq: Two times a day (BID) | ORAL | Status: DC

## 2015-04-29 MED ORDER — PREDNISONE 50 MG PO TABS
60.0000 mg | ORAL_TABLET | Freq: Once | ORAL | Status: AC
  Administered 2015-04-29: 60 mg via ORAL
  Filled 2015-04-29 (×2): qty 1

## 2015-04-29 NOTE — Discharge Instructions (Signed)
Acute Bronchitis Bronchitis is inflammation of the airways that extend from the windpipe into the lungs (bronchi). The inflammation often causes mucus to develop. This leads to a cough, which is the most common symptom of bronchitis.  In acute bronchitis, the condition usually develops suddenly and goes away over time, usually in a couple weeks. Smoking, allergies, and asthma can make bronchitis worse. Repeated episodes of bronchitis may cause further lung problems.  CAUSES Acute bronchitis is most often caused by the same virus that causes a cold. The virus can spread from person to person (contagious) through coughing, sneezing, and touching contaminated objects. SIGNS AND SYMPTOMS   Cough.   Fever.   Coughing up mucus.   Body aches.   Chest congestion.   Chills.   Shortness of breath.   Sore throat.  DIAGNOSIS  Acute bronchitis is usually diagnosed through a physical exam. Your health care provider will also ask you questions about your medical history. Tests, such as chest X-rays, are sometimes done to rule out other conditions.  TREATMENT  Acute bronchitis usually goes away in a couple weeks. Oftentimes, no medical treatment is necessary. Medicines are sometimes given for relief of fever or cough. Antibiotic medicines are usually not needed but may be prescribed in certain situations. In some cases, an inhaler may be recommended to help reduce shortness of breath and control the cough. A cool mist vaporizer may also be used to help thin bronchial secretions and make it easier to clear the chest.  HOME CARE INSTRUCTIONS  Get plenty of rest.   Drink enough fluids to keep your urine clear or pale yellow (unless you have a medical condition that requires fluid restriction). Increasing fluids may help thin your respiratory secretions (sputum) and reduce chest congestion, and it will prevent dehydration.   Take medicines only as directed by your health care provider.  If  you were prescribed an antibiotic medicine, finish it all even if you start to feel better.  Avoid smoking and secondhand smoke. Exposure to cigarette smoke or irritating chemicals will make bronchitis worse. If you are a smoker, consider using nicotine gum or skin patches to help control withdrawal symptoms. Quitting smoking will help your lungs heal faster.   Reduce the chances of another bout of acute bronchitis by washing your hands frequently, avoiding people with cold symptoms, and trying not to touch your hands to your mouth, nose, or eyes.   Keep all follow-up visits as directed by your health care provider.  SEEK MEDICAL CARE IF: Your symptoms do not improve after 1 week of treatment.  SEEK IMMEDIATE MEDICAL CARE IF:  You develop an increased fever or chills.   You have chest pain.   You have severe shortness of breath.  You have bloody sputum.   You develop dehydration.  You faint or repeatedly feel like you are going to pass out.  You develop repeated vomiting.  You develop a severe headache. MAKE SURE YOU:   Understand these instructions.  Will watch your condition.  Will get help right away if you are not doing well or get worse. Document Released: 01/10/2005 Document Revised: 04/19/2014 Document Reviewed: 05/26/2013 [facility] Patient Information 2015 [place], [place]. This information is not intended to replace advice given to you by your health care provider. Make sure you discuss any questions you have with your health care provider.  Shortness of Breath Shortness of breath means you have trouble breathing. Shortness of breath needs medical care right away. HOME CARE  Do not smoke.  Avoid being around chemicals or things (paint fumes, dust) that may bother your breathing.  Rest as needed. Slowly begin your normal activities.  Only take medicines as told by your doctor.  Keep all doctor visits as told. GET HELP RIGHT AWAY IF:   Your shortness of  breath gets worse.  You feel lightheaded, pass out (faint), or have a cough that is not helped by medicine.  You cough up blood.  You have pain with breathing.  You have pain in your chest, arms, shoulders, or belly (abdomen).  You have a fever.  You cannot walk up stairs or exercise the way you normally do.  You do not get better in the time expected.  You have a hard time doing normal activities even with rest.  You have problems with your medicines.  You have any new symptoms. MAKE SURE YOU:  Understand these instructions.  Will watch your condition.  Will get help right away if you are not doing well or get worse. Document Released: 05/21/2008 Document Revised: 12/08/2013 Document Reviewed: 02/18/2012 [facility] Patient Information 2015 [place], [place]. This information is not intended to replace advice given to you by your health care provider. Make sure you discuss any questions you have with your health care provider.  Smoking Cessation Quitting smoking is important to your health and has many advantages. However, it is not always easy to quit since nicotine is a very addictive drug. Oftentimes, people try 3 times or more before being able to quit. This document explains the best ways for you to prepare to quit smoking. Quitting takes hard work and a lot of effort, but you can do it. ADVANTAGES OF QUITTING SMOKING  You will live longer, feel better, and live better.  Your body will feel the impact of quitting smoking almost immediately.  Within 20 minutes, blood pressure decreases. Your pulse returns to its normal level.  After 8 hours, carbon monoxide levels in the blood return to normal. Your oxygen level increases.  After 24 hours, the chance of having a heart attack starts to decrease. Your breath, hair, and body stop smelling like smoke.  After 48 hours, damaged nerve endings begin to recover. Your sense of taste and smell improve.  After 72 hours, the body is  virtually free of nicotine. Your bronchial tubes relax and breathing becomes easier.  After 2 to 12 weeks, lungs can hold more air. Exercise becomes easier and circulation improves.  The risk of having a heart attack, stroke, cancer, or lung disease is greatly reduced.  After 1 year, the risk of coronary heart disease is cut in half.  After 5 years, the risk of stroke falls to the same as a nonsmoker.  After 10 years, the risk of lung cancer is cut in half and the risk of other cancers decreases significantly.  After 15 years, the risk of coronary heart disease drops, usually to the level of a nonsmoker.  If you are pregnant, quitting smoking will improve your chances of having a healthy baby.  The people you live with, especially any children, will be healthier.  You will have extra money to spend on things other than cigarettes. QUESTIONS TO THINK ABOUT BEFORE ATTEMPTING TO QUIT You may want to talk about your answers with your health care provider.  Why do you want to quit?  If you tried to quit in the past, what helped and what did not?  What will be the most difficult situations for  you after you quit? How will you plan to handle them?  Who can help you through the tough times? Your family? Friends? A health care provider?  What pleasures do you get from smoking? What ways can you still get pleasure if you quit? Here are some questions to ask your health care provider:  How can you help me to be successful at quitting?  What medicine do you think would be best for me and how should I take it?  What should I do if I need more help?  What is smoking withdrawal like? How can I get information on withdrawal? GET READY  Set a quit date.  Change your environment by getting rid of all cigarettes, ashtrays, matches, and lighters in your home, car, or work. Do not let people smoke in your home.  Review your past attempts to quit. Think about what worked and what did  not. GET SUPPORT AND ENCOURAGEMENT You have a better chance of being successful if you have help. You can get support in many ways.  Tell your family, friends, and coworkers that you are going to quit and need their support. Ask them not to smoke around you.  Get individual, group, or telephone counseling and support. Programs are available at [employer] and health centers. Call your local health department for information about programs in your area.  Spiritual beliefs and practices may help some smokers quit.  Download a "quit meter" on your computer to keep track of quit statistics, such as how long you have gone without smoking, cigarettes not smoked, and money saved.  Get a self-help book about quitting smoking and staying off tobacco. [employer] [employer] yourself from urges to smoke. Talk to someone, go for a walk, or occupy your time with a task.  Change your normal routine. Take a different route to work. Drink tea instead of coffee. Eat breakfast in a different place.  Reduce your stress. Take a hot bath, exercise, or read a book.  Plan something enjoyable to do every day. Reward yourself for not smoking.  Explore interactive web-based programs that specialize in helping you quit. GET MEDICINE AND USE IT CORRECTLY Medicines can help you stop smoking and decrease the urge to smoke. Combining medicine with the above behavioral methods and support can greatly increase your chances of successfully quitting smoking.  Nicotine replacement therapy helps deliver nicotine to your body without the negative effects and risks of smoking. Nicotine replacement therapy includes nicotine gum, lozenges, inhalers, nasal sprays, and skin patches. Some may be available over-the-counter and others require a prescription.  Antidepressant medicine helps people abstain from smoking, but how this works is unknown. This medicine is available by prescription.  Nicotinic  receptor partial agonist medicine simulates the effect of nicotine in your brain. This medicine is available by prescription. Ask your health care provider for advice about which medicines to use and how to use them based on your health history. Your health care provider will tell you what side effects to look out for if you choose to be on a medicine or therapy. Carefully read the information on the package. Do not use any other product containing nicotine while using a nicotine replacement product.  RELAPSE OR DIFFICULT SITUATIONS Most relapses occur within the first 3 months after quitting. Do not be discouraged if you start smoking again. Remember, most people try several times before finally quitting. You may have symptoms of withdrawal because your body is used to  nicotine. You may crave cigarettes, be irritable, feel very hungry, cough often, get headaches, or have difficulty concentrating. The withdrawal symptoms are only temporary. They are strongest when you first quit, but they will go away within 10-14 days. To reduce the chances of relapse, try to:  Avoid drinking alcohol. Drinking lowers your chances of successfully quitting.  Reduce the amount of caffeine you consume. Once you quit smoking, the amount of caffeine in your body increases and can give you symptoms, such as a rapid heartbeat, sweating, and anxiety.  Avoid smokers because they can make you want to smoke.  Do not let weight gain distract you. Many smokers will gain weight when they quit, usually less than 10 pounds. Eat a healthy diet and stay active. You can always lose the weight gained after you quit.  Find ways to improve your mood other than smoking. FOR MORE INFORMATION  www.smokefree.gov  Document Released: 11/27/2001 Document Revised: 04/19/2014 Document Reviewed: 03/13/2012 [facility] Patient Information 2015 [place], [place]. This information is not intended to replace advice given to you by your health care  provider. Make sure you discuss any questions you have with your health care provider.

## 2015-04-29 NOTE — ED Notes (Signed)
Inhaler and spacer given, patient did repeat demonstration.

## 2015-04-29 NOTE — ED Provider Notes (Signed)
CSN: [id number]     Arrival date & time 04/29/15  1820 History  This chart was scribed for Daleen Bo, MD by Marlowe Kays, ED Scribe. This patient was seen in room APA05/APA05 and the patient's care was started at 8:25 PM.  Chief Complaint  Patient presents with  . Shortness of Breath   Patient is a 64 y.o. male presenting with shortness of breath. The history is provided by the patient and medical records. No language interpreter was used.  Shortness of Breath Associated symptoms: cough, diaphoresis and wheezing   Associated symptoms: no fever and no vomiting     HPI Comments:  Raymond Walker is a 64 y.o. male with PMHx of COPD who presents to the Emergency Department complaining of SOB that began approximately two weeks ago. He states the SOB started after smoking a cigarette, although he has been smoking for several years. He reports associated productive cough of yellowish, greenish, white sputum. He also reports diaphoresis. He has not done anything to treat his symptoms. He states he has not smoked in the past four days and is trying to quit. Walking exacerbates the SOB. Denies alleviating factors. Denies fever, sleep disturbance, nausea, vomiting or leg swelling. PMHx of pancreatitis, ED and nephrolithiasis.   Past Medical History  Diagnosis Date  . Pancreatitis, recurrent   . COPD (chronic obstructive pulmonary disease)   . Erectile dysfunction   . Impaired glucose tolerance   . Nephrolithiasis    Past Surgical History  Procedure Laterality Date  . Cholecystectomy    . Orchiectomy    . Vasectomy     Family History  Problem Relation Age of Onset  . Hypertension Father   . Coronary artery disease Father   . Coronary artery disease Mother   . Cancer Mother     Breast   History  Substance Use Topics  . Smoking status: Current Every Day Smoker -- 1.50 packs/day    Types: Cigarettes  . Smokeless tobacco: Never Used  . Alcohol Use: No    Review of Systems   Constitutional: Positive for diaphoresis. Negative for fever.  Respiratory: Positive for cough, shortness of breath and wheezing.   Cardiovascular: Negative for leg swelling.  Gastrointestinal: Negative for nausea and vomiting.  Psychiatric/Behavioral: Negative for sleep disturbance.  All other systems reviewed and are negative.   Allergies  Review of patient's allergies indicates no known allergies.  Home Medications   Prior to Admission medications   Medication Sig Start Date End Date Taking? Authorizing Provider  doxycycline (VIBRAMYCIN) 100 MG capsule Take 1 capsule (100 mg total) by mouth 2 (two) times daily. Patient not taking: Reported on 04/29/2015 09/08/12   Prentiss Bells, MD  omeprazole (PRILOSEC) 20 MG capsule Take 1 capsule (20 mg total) by mouth daily. Patient not taking: Reported on 04/29/2015 01/08/12 01/07/13  Leonard Schwartz, MD   Triage Vitals: BP 160/109 mmHg  Pulse 86  Temp(Src) 97.8 F (36.6 C) (Oral)  Resp 17  Ht 6' (1.829 m)  Wt 167 lb 8 oz (75.978 kg)  BMI 22.71 kg/m2  SpO2 97% Physical Exam  Constitutional: He is oriented to person, place, and time. He appears well-developed and well-nourished.  HENT:  Head: Normocephalic and atraumatic.  Right Ear: External ear normal.  Left Ear: External ear normal.  Eyes: Conjunctivae and EOM are normal. Pupils are equal, round, and reactive to light.  Neck: Normal range of motion and phonation normal. Neck supple.  Cardiovascular: Normal rate, regular rhythm and normal  heart sounds.   Pulmonary/Chest: He has decreased breath sounds (bilaterally). He has wheezes (scattered). He has rhonchi (scattered). He exhibits no bony tenderness.  Increased work of breathing.  Abdominal: Soft. There is no tenderness.  Musculoskeletal: Normal range of motion. He exhibits no edema.  Neurological: He is alert and oriented to person, place, and time. No cranial nerve deficit or sensory deficit. He exhibits normal muscle tone.  Coordination normal.  Skin: Skin is warm, dry and intact.  Psychiatric: He has a normal mood and affect. His behavior is normal. Judgment and thought content normal.  Nursing note and vitals reviewed.   ED Course  Procedures (including critical care time) DIAGNOSTIC STUDIES: Oxygen Saturation is 97% on RA, normal by my interpretation.   COORDINATION OF CARE: 8:33 PM- Will prescribe Prednisone, Albuterol MDI and Doxycycline, then discharge. Pt verbalizes understanding and agrees to plan.  Medications  ipratropium-albuterol (DUONEB) 0.5-2.5 (3) MG/3ML nebulizer solution 3 mL (3 mLs Nebulization Given 04/29/15 2004)   Labs Review Labs Reviewed  CBC WITH DIFFERENTIAL/PLATELET - Abnormal; Notable for the following:    Platelets 115 (*)    All other components within normal limits  COMPREHENSIVE METABOLIC PANEL - Abnormal; Notable for the following:    Glucose, Bld 116 (*)    All other components within normal limits  TROPONIN I    Imaging Review Dg Chest 2 View  04/29/2015   CLINICAL DATA:  Shortness of breath.  EXAM: CHEST  2 VIEW  COMPARISON:  None.  FINDINGS: The heart size and mediastinal contours are within normal limits. Both lungs are clear. No pneumothorax or pleural effusion is noted. Hyperexpansion of the lungs is noted consistent with chronic obstructive pulmonary disease. The visualized skeletal structures are unremarkable.  IMPRESSION: Findings consistent with chronic obstructive pulmonary disease. No acute cardiopulmonary abnormality seen.   Electronically Signed   By: Marijo Conception, M.D.   On: 04/29/2015 19:18     EKG Interpretation   Date/Time:  Friday Apr 29 2015 18:37:10 EDT Ventricular Rate:  96 PR Interval:  178 QRS Duration: 90 QT Interval:  344 QTC Calculation: 434 R Axis:   89 Text Interpretation:  Sinus rhythm with marked sinus arrhythmia Otherwise  normal ECG Since last tracing rate faster Confirmed by Cailynn Bodnar  MD, Karri Kallenbach  [phone]) on 04/29/2015 7:11:48  PM      MDM   Final diagnoses:  Acute bronchitis, unspecified organism  Tobacco abuse      Tobacco abuse with acute bronchitis and history of COPD., Doubt pneumonia, serious bacterial infection or impending vascular collapse.  Nursing Notes Reviewed/ Care Coordinated Applicable Imaging Reviewed Interpretation of Laboratory Data incorporated into ED treatment  The patient appears reasonably screened and/or stabilized for discharge and I doubt any other medical condition or other [facility] requiring further screening, evaluation, or treatment in the ED at this time prior to discharge.  Plan: Home Medications- Prednisone, Doxy., Albuterol; Home Treatments- Stop smoking; return here if the recommended treatment, does not improve the symptoms; Recommended follow up- PCP prn   I personally performed the services described in this documentation, which was scribed in my presence. The recorded information has been reviewed and is accurate.    Daleen Bo, MD 04/29/15 2043

## 2015-04-29 NOTE — ED Notes (Signed)
Cough, sob for 2 weeks, yellow sputum.  "sweats",  No fever.  Pain lower ant chest "from breathing so hard"

## 2015-09-17 ENCOUNTER — Emergency Department (HOSPITAL_COMMUNITY): Payer: Medicare Other

## 2015-09-17 ENCOUNTER — Emergency Department (HOSPITAL_COMMUNITY)
Admission: EM | Admit: 2015-09-17 | Discharge: 2015-09-17 | Disposition: A | Discharge status: Home / Self Care | Payer: Medicare Other | Attending: Emergency Medicine | Admitting: Emergency Medicine

## 2015-09-17 ENCOUNTER — Encounter (HOSPITAL_COMMUNITY): Payer: Self-pay | Admitting: *Deleted

## 2015-09-17 DIAGNOSIS — J441 Chronic obstructive pulmonary disease with (acute) exacerbation: Secondary | ICD-10-CM | POA: Diagnosis not present

## 2015-09-17 DIAGNOSIS — Z720 Tobacco use: Secondary | ICD-10-CM | POA: Insufficient documentation

## 2015-09-17 DIAGNOSIS — J189 Pneumonia, unspecified organism: Secondary | ICD-10-CM

## 2015-09-17 DIAGNOSIS — F17210 Nicotine dependence, cigarettes, uncomplicated: Secondary | ICD-10-CM | POA: Diagnosis not present

## 2015-09-17 DIAGNOSIS — Z792 Long term (current) use of antibiotics: Secondary | ICD-10-CM | POA: Insufficient documentation

## 2015-09-17 DIAGNOSIS — J159 Unspecified bacterial pneumonia: Secondary | ICD-10-CM | POA: Insufficient documentation

## 2015-09-17 DIAGNOSIS — Z87438 Personal history of other diseases of male genital organs: Secondary | ICD-10-CM | POA: Insufficient documentation

## 2015-09-17 DIAGNOSIS — Z87442 Personal history of urinary calculi: Secondary | ICD-10-CM | POA: Insufficient documentation

## 2015-09-17 DIAGNOSIS — R042 Hemoptysis: Secondary | ICD-10-CM | POA: Diagnosis present

## 2015-09-17 DIAGNOSIS — Z7952 Long term (current) use of systemic steroids: Secondary | ICD-10-CM | POA: Insufficient documentation

## 2015-09-17 DIAGNOSIS — J449 Chronic obstructive pulmonary disease, unspecified: Secondary | ICD-10-CM | POA: Diagnosis not present

## 2015-09-17 DIAGNOSIS — Z8719 Personal history of other diseases of the digestive system: Secondary | ICD-10-CM | POA: Diagnosis not present

## 2015-09-17 DIAGNOSIS — R05 Cough: Secondary | ICD-10-CM | POA: Diagnosis not present

## 2015-09-17 LAB — COMPREHENSIVE METABOLIC PANEL
ALT: 14 U/L — ABNORMAL LOW (ref 17–63)
AST: 17 U/L (ref 15–41)
Albumin: 3.8 g/dL (ref 3.5–5.0)
Alkaline Phosphatase: 87 U/L (ref 38–126)
Anion gap: 8 (ref 5–15)
BUN: 16 mg/dL (ref 6–20)
CO2: 28 mmol/L (ref 22–32)
Calcium: 9 mg/dL (ref 8.9–10.3)
Chloride: 103 mmol/L (ref 101–111)
Creatinine, Ser: 0.96 mg/dL (ref 0.61–1.24)
GFR calc Af Amer: >60 mL/min (ref >60)
GFR calc non Af Amer: >60 mL/min (ref >60)
Glucose, Bld: 113 mg/dL — ABNORMAL HIGH (ref 65–99)
Potassium: 3.6 mmol/L (ref 3.5–5.1)
Sodium: 139 mmol/L (ref 135–145)
Total Bilirubin: 0.4 mg/dL (ref 0.3–1.2)
Total Protein: 7.1 g/dL (ref 6.5–8.1)

## 2015-09-17 LAB — CBC WITH DIFFERENTIAL/PLATELET
Basophils Absolute: 0 10*3/uL (ref 0.0–0.1)
Basophils Relative: 0 %
Eosinophils Absolute: 0.1 10*3/uL (ref 0.0–0.7)
Eosinophils Relative: 1 %
HCT: 43.7 % (ref 39.0–52.0)
Hemoglobin: 15.1 g/dL (ref 13.0–17.0)
Lymphocytes Relative: 27 %
Lymphs Abs: 2.2 10*3/uL (ref 0.7–4.0)
MCH: 30.3 pg (ref 26.0–34.0)
MCHC: 34.6 g/dL (ref 30.0–36.0)
MCV: 87.8 fL (ref 78.0–100.0)
Monocytes Absolute: 0.7 10*3/uL (ref 0.1–1.0)
Monocytes Relative: 9 %
Neutro Abs: 4.9 10*3/uL (ref 1.7–7.7)
Neutrophils Relative %: 62 %
Platelets: 118 10*3/uL — ABNORMAL LOW (ref 150–400)
RBC: 4.98 MIL/uL (ref 4.22–5.81)
RDW: 14.1 % (ref 11.5–15.5)
WBC: 7.9 10*3/uL (ref 4.0–10.5)

## 2015-09-17 LAB — PROTIME-INR
INR: 1.09 (ref 0.00–1.49)
Prothrombin Time: 14.3 seconds (ref 11.6–15.2)

## 2015-09-17 MED ORDER — ALBUTEROL SULFATE HFA 108 (90 BASE) MCG/ACT IN AERS
2.0000 | INHALATION_SPRAY | RESPIRATORY_TRACT | Status: DC | PRN
  Administered 2015-09-17: 2 via RESPIRATORY_TRACT
  Filled 2015-09-17: qty 6.7

## 2015-09-17 MED ORDER — PREDNISONE 20 MG PO TABS: 40.0000 mg | ORAL_TABLET | Freq: Every day | ORAL | Status: DC

## 2015-09-17 MED ORDER — LEVOFLOXACIN 750 MG PO TABS: 750.0000 mg | ORAL_TABLET | Freq: Every day | ORAL | Status: DC

## 2015-09-17 NOTE — ED Notes (Addendum)
Pt states he began coughing up blood throughout the day. Pt denies pain any where stating he only feels a little weak. NAD noted.    Pt states he has had a cough several months before today.

## 2015-09-17 NOTE — ED Provider Notes (Signed)
CSN: [id number]     Arrival date & time 09/17/15  1526 History   First MD Initiated Contact with Patient 09/17/15 1535     Chief Complaint  Patient presents with  . Hemoptysis     (Consider location/radiation/quality/duration/timing/severity/associated sxs/prior Treatment) HPI Comments: Patient presents to the ER for evaluation of hemoptysis. Patient reports that he awakened this morning and coughed up thick sputum that had blood in it. Patient reports that over the last month he has had worsening of his breathing. He has had persistent cough that is productive of thick sputum, but this is the first time we have seen any blood in his sputum. He does have a long smoking history. He is not on oxygen or albuterol at home. Patient denies any chest pain. He has not had any palpitations, weakness or passing out.   Past Medical History  Diagnosis Date  . Pancreatitis, recurrent ([place])   . COPD (chronic obstructive pulmonary disease) ([place])   . Erectile dysfunction   . Impaired glucose tolerance   . Nephrolithiasis    Past Surgical History  Procedure Laterality Date  . Cholecystectomy    . Orchiectomy    . Vasectomy     Family History  Problem Relation Age of Onset  . Hypertension Father   . Coronary artery disease Father   . Coronary artery disease Mother   . Cancer Mother     Breast   Social History  Substance Use Topics  . Smoking status: Current Every Day Smoker -- 1.50 packs/day    Types: Cigarettes  . Smokeless tobacco: Never Used  . Alcohol Use: No    Review of Systems  Respiratory: Positive for cough and shortness of breath.   Cardiovascular: Negative for chest pain.  All other systems reviewed and are negative.     Allergies  Review of patient's allergies indicates no known allergies.  Home Medications   Prior to Admission medications   Medication Sig Start Date End Date Taking? Authorizing Provider  doxycycline (VIBRAMYCIN) 100 MG capsule Take 1 capsule (100  mg total) by mouth 2 (two) times daily. 04/29/15   Daleen Bo, MD  predniSONE (DELTASONE) 20 MG tablet Take 1 tablet (20 mg total) by mouth 2 (two) times daily. 04/29/15   Daleen Bo, MD   BP 144/74 mmHg  Pulse 86  Temp(Src) 97.7 F (36.5 C) (Oral)  Resp 20  Ht 6' (1.829 m)  Wt 167 lb (75.751 kg)  BMI 22.64 kg/m2  SpO2 96% Physical Exam  Constitutional: He is oriented to person, place, and time. He appears well-developed and well-nourished. No distress.  HENT:  Head: Normocephalic and atraumatic.  Right Ear: Hearing normal.  Left Ear: Hearing normal.  Nose: Nose normal.  Mouth/Throat: Oropharynx is clear and moist and mucous membranes are normal.  Eyes: Conjunctivae and EOM are normal. Pupils are equal, round, and reactive to light.  Neck: Normal range of motion. Neck supple.  Cardiovascular: Regular rhythm, S1 normal and S2 normal.  Exam reveals no gallop and no friction rub.   No murmur heard. Pulmonary/Chest: Effort normal. No respiratory distress. He has decreased breath sounds in the right middle field, the right lower field, the left middle field and the left lower field. He exhibits no tenderness.  Abdominal: Soft. Normal appearance and bowel sounds are normal. There is no hepatosplenomegaly. There is no tenderness. There is no rebound, no guarding, no tenderness at McBurney's point and negative Murphy's sign. No hernia.  Musculoskeletal: Normal range of motion.  Neurological: He is alert and oriented to person, place, and time. He has normal strength. No cranial nerve deficit or sensory deficit. Coordination normal. GCS eye subscore is 4. GCS verbal subscore is 5. GCS motor subscore is 6.  Skin: Skin is warm, dry and intact. No rash noted. No cyanosis.  Psychiatric: He has a normal mood and affect. His speech is normal and behavior is normal. Thought content normal.  Nursing note and vitals reviewed.   ED Course  Procedures (including critical care time) Labs  Review Labs Reviewed  CBC WITH DIFFERENTIAL/PLATELET  COMPREHENSIVE METABOLIC PANEL  PROTIME-INR    Imaging Review No results found. I have personally reviewed and evaluated these images and lab results as part of my medical decision-making.   EKG Interpretation None      MDM   Final diagnoses:  None   community acquired pneumonia  Patient reports that he has been experiencing increased shortness of breath and productive cough for approximately a month or more. He does have a long smoking history. Patient noticed blood in his sputum today. His workup today has revealed pneumonia. This explains the patient's current symptoms. He is not hypoxic. He is breathing comfortably, is a candidate for outpatient treatment. Will treat with Levaquin, albuterol. Follow-up with primary doctor for recheck in one week, return if symptoms worsen.    Orpah Greek, MD 09/17/15 [phone]

## 2015-09-17 NOTE — Discharge Instructions (Signed)
Pneumonia, Adult  Pneumonia is an infection of the lungs. It may be caused by a germ (virus or bacteria). Some types of pneumonia can spread easily from person to person. This can happen when you cough or sneeze.  HOME CARE   Only take medicine as told by your doctor.   Take your medicine (antibiotics) as told. Finish it even if you start to feel better.   Do not smoke.   You may use a vaporizer or humidifier in your room. This can help loosen thick spit (mucus).   Sleep so you are almost sitting up (semi-upright). This helps reduce coughing.   Rest.  A shot (vaccine) can help prevent pneumonia. Shots are often advised for:   People over 65 years old.   Patients on chemotherapy.   People with long-term (chronic) lung problems.   People with immune system problems.  GET HELP RIGHT AWAY IF:    You are getting worse.   You cannot control your cough, and you are losing sleep.   You cough up blood.   Your pain gets worse, even with medicine.   You have a fever.   Any of your problems are getting worse, not better.   You have shortness of breath or chest pain.  MAKE SURE YOU:    Understand these instructions.   Will watch your condition.   Will get help right away if you are not doing well or get worse.  Document Released: 05/21/2008 Document Revised: 02/25/2012 Document Reviewed: 02/23/2011  ExitCare Patient Information 2015 [employer]. This information is not intended to replace advice given to you by your health care provider. Make sure you discuss any questions you have with your health care provider.

## 2015-09-18 ENCOUNTER — Telehealth: Payer: Self-pay | Admitting: Family Medicine

## 2015-09-18 NOTE — Telephone Encounter (Signed)
The ER sent [place] a note that the patient was seen for pneumonia in the ER it was recommended for him to follow-up in one week. I am not certain if this patient is a patient at our practice? Please connect with the patient. Make sure that he is going to follow-up with physician within 7-10 days. If we are still his family physician schedule him if not advised him to see his doctor and document area

## 2015-09-19 NOTE — Telephone Encounter (Signed)
Please check and see if this pt is at our practice. Thank you

## 2015-09-19 NOTE — Telephone Encounter (Signed)
[facility]. Pt is dr steve's pt.

## 2015-09-19 NOTE — Telephone Encounter (Signed)
Pt is our patient, chart given to [facility]

## 2015-09-19 NOTE — Telephone Encounter (Signed)
Pt's wife states he is going to come by to schedule follow up today. Pt needs follow up within  7- 10 days. Pt sees dr Richardson Landry.

## 2015-09-26 ENCOUNTER — Ambulatory Visit (INDEPENDENT_AMBULATORY_CARE_PROVIDER_SITE_OTHER): Payer: Medicare Other | Admitting: Family Medicine

## 2015-09-26 ENCOUNTER — Encounter: Payer: Self-pay | Admitting: Family Medicine

## 2015-09-26 VITALS — BP 124/88 | Temp 97.9°F | Ht 72.0 in | Wt 168.0 lb

## 2015-09-26 DIAGNOSIS — R042 Hemoptysis: Secondary | ICD-10-CM | POA: Diagnosis not present

## 2015-09-26 DIAGNOSIS — Z720 Tobacco use: Secondary | ICD-10-CM

## 2015-09-26 DIAGNOSIS — J189 Pneumonia, unspecified organism: Secondary | ICD-10-CM | POA: Diagnosis not present

## 2015-09-26 DIAGNOSIS — F17200 Nicotine dependence, unspecified, uncomplicated: Secondary | ICD-10-CM

## 2015-09-26 MED ORDER — AZITHROMYCIN 250 MG PO TABS: ORAL_TABLET | ORAL | Status: DC

## 2015-09-26 MED ORDER — SILDENAFIL CITRATE 20 MG PO TABS: ORAL_TABLET | ORAL | Status: DC

## 2015-09-26 NOTE — Progress Notes (Signed)
Scheduled on the 25th

## 2015-09-26 NOTE — Progress Notes (Signed)
   Subjective:    Patient ID: Raymond Walker, male    DOB: 1951/10/07, 64 y.o.   MRN: [id number]  Cough This is a new problem. The current episode started 1 to 4 weeks ago. The problem has been waxing and waning. The cough is productive of bloody sputum. Associated symptoms include hemoptysis and nasal congestion. Pertinent negatives include no chest pain, chills, fever, myalgias or postnasal drip. Nothing aggravates the symptoms. Risk factors for lung disease include smoking/tobacco exposure. The treatment provided no relief.  Follow up ER visit. Went on 10/1. Pt states he had walking pneumonia. Finished prednisone and levaquin.  Pt states he feels good. Still having a cough. Coughing up some blood.   He states his energy level is picking up. He knows he needs to quit smoking.  Review of Systems  Constitutional: Negative for fever, chills, activity change, appetite change and fatigue.  HENT: Negative for congestion and postnasal drip.   Respiratory: Positive for hemoptysis. Negative for cough.   Cardiovascular: Negative for chest pain.  Gastrointestinal: Negative for abdominal pain.  Endocrine: Negative for polydipsia and polyphagia.  Musculoskeletal: Negative for myalgias.  Neurological: Negative for weakness.  Psychiatric/Behavioral: Negative for confusion.       Objective:   Physical Exam  Constitutional: He appears well-developed and well-nourished. No distress.  HENT:  Head: Normocephalic.  Mouth/Throat: Oropharynx is clear and moist. No oropharyngeal exudate.  Neck: Normal range of motion.  Cardiovascular: Normal rate, regular rhythm and normal heart sounds.   No murmur heard. Pulmonary/Chest: Effort normal and breath sounds normal. No respiratory distress. He has no wheezes.  Musculoskeletal: He exhibits no edema.  Lymphadenopathy:    He has no cervical adenopathy.  Neurological: He is alert. He exhibits normal muscle tone.  Skin: Skin is warm and dry.  Psychiatric: His  behavior is normal.  Nursing note and vitals reviewed.         Assessment & Plan:  pneumonia-patient with hemoptysis intermittently. Patient does not feel sick.case was discussed with radiologist they recommend repeat chest x-ray 2 weeks. Additional medication CPAP prescribed. Patient was informed specifically that if he has progression of symptoms he will need CAT scan. Patient is a smoker long-term smoker worrisome for possibility of cancer. Patient was told that when he follows up we will set him up for a CAT scan.patient was informed the importance of doing a chest x-ray and keeping a follow-up visit.

## 2015-10-10 ENCOUNTER — Ambulatory Visit (HOSPITAL_COMMUNITY)
Admission: RE | Admit: 2015-10-10 | Discharge: 2015-10-10 | Disposition: A | Discharge status: Home / Self Care | Payer: Medicare Other | Source: Ambulatory Visit | Attending: Family Medicine | Admitting: Family Medicine

## 2015-10-10 DIAGNOSIS — J189 Pneumonia, unspecified organism: Secondary | ICD-10-CM | POA: Diagnosis present

## 2015-10-11 ENCOUNTER — Other Ambulatory Visit: Payer: Self-pay | Admitting: Family Medicine

## 2015-10-11 ENCOUNTER — Encounter: Payer: Self-pay | Admitting: Family Medicine

## 2015-10-11 ENCOUNTER — Ambulatory Visit (INDEPENDENT_AMBULATORY_CARE_PROVIDER_SITE_OTHER): Payer: Medicare Other | Admitting: Family Medicine

## 2015-10-11 VITALS — BP 130/80 | Ht 72.0 in | Wt 167.4 lb

## 2015-10-11 DIAGNOSIS — Z125 Encounter for screening for malignant neoplasm of prostate: Secondary | ICD-10-CM | POA: Diagnosis not present

## 2015-10-11 DIAGNOSIS — Z23 Encounter for immunization: Secondary | ICD-10-CM

## 2015-10-11 DIAGNOSIS — R739 Hyperglycemia, unspecified: Secondary | ICD-10-CM | POA: Diagnosis not present

## 2015-10-11 DIAGNOSIS — R042 Hemoptysis: Secondary | ICD-10-CM | POA: Diagnosis not present

## 2015-10-11 DIAGNOSIS — Z13220 Encounter for screening for lipoid disorders: Secondary | ICD-10-CM | POA: Diagnosis not present

## 2015-10-11 DIAGNOSIS — J189 Pneumonia, unspecified organism: Secondary | ICD-10-CM | POA: Diagnosis not present

## 2015-10-11 MED ORDER — AMOXICILLIN-POT CLAVULANATE 875-125 MG PO TABS: 1.0000 | ORAL_TABLET | Freq: Two times a day (BID) | ORAL | Status: DC

## 2015-10-11 NOTE — Progress Notes (Signed)
   Subjective:    Patient ID: Raymond Walker, male    DOB: [birth year], 64 y.o.   MRN: [id number]  HPI Patient arrives for a follow up on recent visit for coughing up blood and pneumonia. Patient states he is feeling a lot better.  patient states he's no longer coughing up blood but is coughing up phlegm for about 2 weeks he was coughing up blood patient has a heavy smoking history. I reviewed over his x-ray. It is not totally cleared. We need to rule out possibility of a peribronchial lesion. I recommend CT scan spoke with radiologist recommend CT scan with contrast  He denies night sweats denies weight loss states appetite fair He does relate bilateral pain in his lower legs with walking unable to do any significant length walking Review of Systems  patient cough congestion phlegm was having hemoptysis but past couple days not having hemoptysis appetite okay    Objective:   Physical Exam  lungs are clear hearts regular pulse normal extremities no edema       Assessment & Plan:   recent pneumonia recent hemoptysis recommend CT scan with contrast pneumonia has not cleared. This needs further looking into with his severe history of smoking also hemoptysis for 2 weeks needs closer looking in   patient has history hyperglycemia we need to check L2H metabolic 7 and glucose he was encouraged watch diet stay physically active  pneumococcal vaccine today History hyperlipidemia check lipid profile watch diet stay active  concerning for the possibility of claudication but patient defers on workup currently until further intervention on the hemoptysis is completed 25 minutes was spent with the patient. Greater than half the time was spent in discussion and answering questions and counseling regarding the issues that the patient came in for today.

## 2015-10-12 ENCOUNTER — Encounter: Payer: Self-pay | Admitting: Family Medicine

## 2015-10-12 LAB — BASIC METABOLIC PANEL
BUN/Creatinine Ratio: 12 (ref 10–22)
BUN: 11 mg/dL (ref 8–27)
CO2: 25 mmol/L (ref 18–29)
Calcium: 9.8 mg/dL (ref 8.6–10.2)
Chloride: 98 mmol/L (ref 97–106)
Creatinine, Ser: 0.93 mg/dL (ref 0.76–1.27)
GFR calc Af Amer: 99 mL/min/{1.73_m2} (ref >59)
GFR calc non Af Amer: 86 mL/min/{1.73_m2} (ref >59)
Glucose: 101 mg/dL — ABNORMAL HIGH (ref 65–99)
Potassium: 4.1 mmol/L (ref 3.5–5.2)
Sodium: 141 mmol/L (ref 136–144)

## 2015-10-12 LAB — LIPID PANEL
Chol/HDL Ratio: 5.6 ratio units — ABNORMAL HIGH (ref 0.0–5.0)
Cholesterol, Total: 158 mg/dL (ref 100–199)
HDL: 28 mg/dL — ABNORMAL LOW (ref >39)
LDL Calculated: 103 mg/dL — ABNORMAL HIGH (ref 0–99)
Triglycerides: 137 mg/dL (ref 0–149)
VLDL Cholesterol Cal: 27 mg/dL (ref 5–40)

## 2015-10-12 LAB — HEMOGLOBIN A1C
Est. average glucose Bld gHb Est-mCnc: 123 mg/dL
Hgb A1c MFr Bld: 5.9 % — ABNORMAL HIGH (ref 4.8–5.6)

## 2015-10-12 LAB — PSA: Prostate Specific Ag, Serum: 2.2 ng/mL (ref 0.0–4.0)

## 2015-10-14 ENCOUNTER — Ambulatory Visit (HOSPITAL_COMMUNITY)
Admission: RE | Admit: 2015-10-14 | Discharge: 2015-10-14 | Disposition: A | Discharge status: Home / Self Care | Payer: Medicare Other | Source: Ambulatory Visit | Attending: Family Medicine | Admitting: Family Medicine

## 2015-10-14 DIAGNOSIS — R042 Hemoptysis: Secondary | ICD-10-CM | POA: Insufficient documentation

## 2015-10-14 DIAGNOSIS — J189 Pneumonia, unspecified organism: Secondary | ICD-10-CM | POA: Diagnosis present

## 2015-10-14 MED ORDER — IOHEXOL 300 MG/ML  SOLN
75.0000 mL | Freq: Once | INTRAMUSCULAR | Status: AC | PRN
  Administered 2015-10-14: 75 mL via INTRAVENOUS

## 2016-01-04 ENCOUNTER — Other Ambulatory Visit: Payer: Self-pay | Admitting: *Deleted

## 2016-01-04 DIAGNOSIS — R911 Solitary pulmonary nodule: Secondary | ICD-10-CM

## 2016-01-04 NOTE — Progress Notes (Signed)
Follow up CT of Chest with contrast scheduled at [facility] 01/09/16 at 1:45pm. Patient notified.

## 2016-01-09 ENCOUNTER — Ambulatory Visit (HOSPITAL_COMMUNITY)
Admission: RE | Admit: 2016-01-09 | Discharge: 2016-01-09 | Disposition: A | Discharge status: Home / Self Care | Payer: Medicare Other | Source: Ambulatory Visit | Attending: Family Medicine | Admitting: Family Medicine

## 2016-01-09 DIAGNOSIS — R918 Other nonspecific abnormal finding of lung field: Secondary | ICD-10-CM | POA: Diagnosis not present

## 2016-01-09 DIAGNOSIS — R911 Solitary pulmonary nodule: Secondary | ICD-10-CM | POA: Insufficient documentation

## 2016-01-09 DIAGNOSIS — J439 Emphysema, unspecified: Secondary | ICD-10-CM | POA: Diagnosis not present

## 2016-01-09 LAB — POCT I-STAT CREATININE: Creatinine, Ser: 0.9 mg/dL (ref 0.61–1.24)

## 2016-01-09 MED ORDER — IOHEXOL 300 MG/ML  SOLN
75.0000 mL | Freq: Once | INTRAMUSCULAR | Status: AC | PRN
  Administered 2016-01-09: 75 mL via INTRAVENOUS

## 2016-06-16 DEATH — deceased

## 2016-06-27 ENCOUNTER — Ambulatory Visit: Payer: Medicare Other | Admitting: Family Medicine

## 2016-09-19 ENCOUNTER — Telehealth: Payer: Self-pay | Admitting: *Deleted

## 2016-09-19 NOTE — Telephone Encounter (Signed)
Per tickler file: Patient is due CT of chest with contrast. Patient last seen 09/2015. Patient needs office visit with Dr Nicki Reaper specifically to discuss and schedule CT scan (for prior auth purposes)

## 2016-09-19 NOTE — Telephone Encounter (Signed)
Left message to return call 

## 2016-09-26 NOTE — Telephone Encounter (Addendum)
Patient notified and scheduled office visit with Dr Nicki Reaper to discuss.

## 2016-09-30 ENCOUNTER — Encounter (HOSPITAL_COMMUNITY): Payer: Self-pay | Admitting: Emergency Medicine

## 2016-09-30 DIAGNOSIS — F17210 Nicotine dependence, cigarettes, uncomplicated: Secondary | ICD-10-CM | POA: Insufficient documentation

## 2016-09-30 DIAGNOSIS — Z7982 Long term (current) use of aspirin: Secondary | ICD-10-CM | POA: Insufficient documentation

## 2016-09-30 DIAGNOSIS — J449 Chronic obstructive pulmonary disease, unspecified: Secondary | ICD-10-CM | POA: Insufficient documentation

## 2016-09-30 DIAGNOSIS — K089 Disorder of teeth and supporting structures, unspecified: Secondary | ICD-10-CM | POA: Diagnosis not present

## 2016-09-30 DIAGNOSIS — K0889 Other specified disorders of teeth and supporting structures: Secondary | ICD-10-CM | POA: Insufficient documentation

## 2016-09-30 NOTE — ED Triage Notes (Signed)
Pt states he bit into something and has a loose tooth that he can't pull due to the pain. States he has been unable to eat d/t tooth.

## 2016-10-01 ENCOUNTER — Emergency Department (HOSPITAL_COMMUNITY)
Admission: EM | Admit: 2016-10-01 | Discharge: 2016-10-01 | Disposition: A | Discharge status: Home / Self Care | Payer: Medicare Other | Attending: Emergency Medicine | Admitting: Emergency Medicine

## 2016-10-01 DIAGNOSIS — K0889 Other specified disorders of teeth and supporting structures: Secondary | ICD-10-CM | POA: Diagnosis not present

## 2016-10-01 MED ORDER — LIDOCAINE VISCOUS 2 % MT SOLN
15.0000 mL | Freq: Once | OROMUCOSAL | Status: AC
  Administered 2016-10-01: 15 mL via OROMUCOSAL
  Filled 2016-10-01: qty 15

## 2016-10-01 MED ORDER — PENICILLIN V POTASSIUM 500 MG PO TABS: 500.0000 mg | ORAL_TABLET | Freq: Four times a day (QID) | ORAL | 0 refills | Status: AC

## 2016-10-01 MED ORDER — HYDROCODONE-ACETAMINOPHEN 5-325 MG PO TABS: 1.0000 | ORAL_TABLET | Freq: Four times a day (QID) | ORAL | 0 refills | Status: DC | PRN

## 2016-10-01 MED ORDER — PENICILLIN V POTASSIUM 250 MG PO TABS
500.0000 mg | ORAL_TABLET | Freq: Once | ORAL | Status: AC
  Administered 2016-10-01: 500 mg via ORAL
  Filled 2016-10-01: qty 2

## 2016-10-01 NOTE — ED Provider Notes (Signed)
TIME SEEN: 2:30 AM  CHIEF COMPLAINT: Loose tooth, dental pain  HPI: Pt is a 65 y.o. male with history of COPD, recurrent pancreatitis who presents to the emergency department with complaints of a left lower loose tooth for the past several days. States he is having pain in this area with eating. No facial swelling or warmth. No fever. Does not have a dentist. States he was hoping we could pull his tooth today.  ROS: See HPI Constitutional: no fever  Eyes: no drainage  ENT: no runny nose   Cardiovascular:  no chest pain  Resp: no SOB  GI: no vomiting GU: no dysuria Integumentary: no rash  Allergy: no hives  Musculoskeletal: no leg swelling  Neurological: no slurred speech ROS otherwise negative  PAST MEDICAL HISTORY/PAST SURGICAL HISTORY:  Past Medical History:  Diagnosis Date  . COPD (chronic obstructive pulmonary disease) ([place])   . Erectile dysfunction   . Impaired glucose tolerance   . Nephrolithiasis   . Pancreatitis, recurrent ([place])     MEDICATIONS:  Prior to Admission medications   Medication Sig Start Date End Date Taking? Authorizing Provider  amoxicillin-clavulanate (AUGMENTIN) 875-125 MG tablet Take 1 tablet by mouth 2 (two) times daily. 10/11/15   [name], MD  azithromycin (ZITHROMAX Z-PAK) 250 MG tablet Take 2 tablets (500 mg) on  Day 1,  followed by 1 tablet (250 mg) once daily on Days 2 through 5. 09/26/15   [name], MD  sildenafil (REVATIO) 20 MG tablet 3 to 5 before relations prn 09/26/15   [name], MD    ALLERGIES:  No Known Allergies  SOCIAL HISTORY:  Social History  Substance Use Topics  . Smoking status: Current Every Day Smoker    Packs/day: 1.50    Types: Cigarettes  . Smokeless tobacco: Never Used  . Alcohol use No    FAMILY HISTORY: Family History  Problem Relation Age of Onset  . Hypertension Father   . Coronary artery disease Father   . Coronary artery disease Mother   . Cancer Mother     Breast    EXAM: BP  170/84   Pulse 80   Temp 97.6 F (36.4 C) (Temporal)   Resp 16   Ht 6' (1.829 m)   Wt 168 lb (76.2 kg)   SpO2 97%   BMI 22.78 kg/m  CONSTITUTIONAL: Alert and oriented and responds appropriately to questions. Well-appearing; well-nourished HEAD: Normocephalic EYES: Conjunctivae clear, PERRL ENT: normal nose; no rhinorrhea; moist mucous membranes; No pharyngeal erythema or petechiae, no tonsillar hypertrophy or exudate, no uvular deviation, no trismus or drooling, normal phonation, no stridor, multiple dental caries present, , multiple missing teeth, left lower incisor is slightly loose but still firmly attached in the gum, no drainable dental abscess noted, no Ludwig's angina, tongue sits flat in the bottom of the mouth, no angioedema, no facial erythema or warmth, no facial swelling NECK: Supple, no meningismus, no LAD  CARD: RRR; S1 and S2 appreciated; no murmurs, no clicks, no rubs, no gallops RESP: Normal chest excursion without splinting or tachypnea; breath sounds clear and equal bilaterally; no wheezes, no rhonchi, no rales, no hypoxia or respiratory distress, speaking full sentences ABD/GI: Normal bowel sounds; non-distended; soft, non-tender, no rebound, no guarding, no peritoneal signs BACK:  The back appears normal and is non-tender to palpation, there is no CVA tenderness EXT: Normal ROM in all joints; non-tender to palpation; no edema; normal capillary refill; no cyanosis, no calf tenderness or swelling  SKIN: Normal color for age and race; warm; no rash NEURO: Moves all extremities equally, sensation to light touch intact diffusely, cranial nerves II through XII intact PSYCH: The patient's mood and manner are appropriate. Grooming and personal hygiene are appropriate.  MEDICAL DECISION MAKING: Patient here with dental pain and left lower loose tooth. Discussed with him that I cannot pull his tooth from the emergency department and he needs to follow-up with a dentist. Will put  him on prophylactic antibiotics and discharge with some pain medication. No sign of Ludwig's angina or drainable abscess today. Recommended a soft diet. Given outpatient resources. He verbalizes understanding is comfortable with this plan.   At this time, I do not feel there is any life-threatening condition present. I have reviewed and discussed all results (EKG, imaging, lab, urine as appropriate), exam findings with patient/family. I have reviewed nursing notes and appropriate previous records.  I feel the patient is safe to be discharged home without further emergent workup and can continue workup as an outpatient as needed. Discussed usual and customary return precautions. Patient/family verbalize understanding and are comfortable with this plan.  Outpatient follow-up has been provided. All questions have been answered.      Gregory, DO 10/01/16 [phone]

## 2016-10-02 ENCOUNTER — Ambulatory Visit (INDEPENDENT_AMBULATORY_CARE_PROVIDER_SITE_OTHER): Payer: Medicare Other | Admitting: Family Medicine

## 2016-10-02 ENCOUNTER — Telehealth: Payer: Self-pay

## 2016-10-02 ENCOUNTER — Encounter: Payer: Self-pay | Admitting: Family Medicine

## 2016-10-02 VITALS — BP 112/78 | Ht 72.0 in | Wt 164.0 lb

## 2016-10-02 DIAGNOSIS — R911 Solitary pulmonary nodule: Secondary | ICD-10-CM | POA: Diagnosis not present

## 2016-10-02 DIAGNOSIS — F17200 Nicotine dependence, unspecified, uncomplicated: Secondary | ICD-10-CM

## 2016-10-02 DIAGNOSIS — Z8701 Personal history of pneumonia (recurrent): Secondary | ICD-10-CM | POA: Diagnosis not present

## 2016-10-02 MED FILL — Hydrocodone-Acetaminophen Tab 5-325 MG: ORAL | Qty: 6 | Status: AC

## 2016-10-02 NOTE — Telephone Encounter (Signed)
Per Dr. Sallee Lange- Patient needs follow-up CT scan of the chest ordered.  Also needs Medicare abdominal aorta ultrasound ordered because of being a smoker in order and 64.

## 2016-10-02 NOTE — Telephone Encounter (Signed)
-----   Message from [name], MD sent at 10/02/2016 12:23 PM EDT ----- Patient needs follow-up CT scan of the chest ordered. Also needs Medicare abdominal aorta ultrasound ordered because of being a smoker in order and 64

## 2016-10-02 NOTE — Progress Notes (Signed)
   Subjective:    Patient ID: Raymond Walker., male    DOB: 03-04-1951, 65 y.o.   MRN: [id number]  HPI Patient is here to discuss having a follow up CT scan of the chest completed. Patient has no concerns at this time. Patient needs handicap placard form completed.  This patient gets his care from the [place]. He relates that recently they told him take ibuprofen for his joint pain seemed to help a little. He did have a infected tooth the ER doctor gave him penicillin along with hydrocodone. He states hydrocodone helped with his joint pain. The patient does smoke he does not have any cough he did have pneumonia a while back follow-up x-ray showed persistent area of concern and it was recommended for the patient have a follow-up CT scan in 6 months. It is been a little bit over 6 months. It is now time for him to do a follow-up scan.  Review of Systems He denies any cough denies hemoptysis denies shortness of breath chest pain nausea vomiting diarrhea high fevers does relate some arthralgias    Objective:   Physical Exam Lungs are clear no crackles heart is regular pulse normal extremities no edema abdomen is soft   Patient was counseled to quit smoking    Assessment & Plan:  Pulmonary nodule History of pneumonia Radiologist recommended a follow-up CT scan in the chest to make sure that this area has cleared up. We will go ahead and set the patient regarding this. I discussed the case with the radiologist stated that a CT scan with contrast is indicated to make sure that this area from the residual pneumonia has cleared up. The patient also has never had a abdominal aorta ultrasound. We will set this up as well.  Information given regarding colonoscopy. Patient states that he will consider it.  CT scan of lungs indicated Patient also needs Medicare aorta ultrasound

## 2016-10-03 NOTE — Telephone Encounter (Signed)
Left message return call 10/03/16

## 2016-10-03 NOTE — Telephone Encounter (Signed)
Test ordered and scheduled for 10/12/16 @11 :30 am. Patient notified and verbalized understanding.

## 2016-10-10 ENCOUNTER — Telehealth: Payer: Self-pay | Admitting: Family Medicine

## 2016-10-10 NOTE — Telephone Encounter (Signed)
We will not extend er narcotic based on er dental visit, [place] to see mess tomorrow re colon, pt needs to be seen--not today--if for some reason he cannot see a dentist

## 2016-10-10 NOTE — Telephone Encounter (Signed)
Patient was given hydrocodone in ER 10/16 for dental pain

## 2016-10-10 NOTE — Telephone Encounter (Signed)
Advised patient doctor will not extend ER narcotic based on ER dental visit, Dr Nicki Reaper to see tomorrow re colon, pt needs to be seen--not today--if for some reason he cannot see a dentist. Patient verbalized understanding and scheduled office visit tomorrow to discuss. Patient having a hard time finding a dentist who will take his insurance and he currently doesn't have a dentist appointment.

## 2016-10-10 NOTE — Telephone Encounter (Signed)
Pt came by today and stated that he is too nervous to do the colonoscopy as recommended. Pt is also wanting a refill on his HYDROcodone-acetaminophen (NORCO/VICODIN) 5-325 MG tablet.

## 2016-10-11 ENCOUNTER — Ambulatory Visit (INDEPENDENT_AMBULATORY_CARE_PROVIDER_SITE_OTHER): Payer: Medicare Other | Admitting: Family Medicine

## 2016-10-11 ENCOUNTER — Encounter: Payer: Self-pay | Admitting: Family Medicine

## 2016-10-11 VITALS — BP 122/72 | Temp 97.5°F | Ht 72.0 in | Wt 165.1 lb

## 2016-10-11 DIAGNOSIS — M545 Low back pain, unspecified: Secondary | ICD-10-CM

## 2016-10-11 MED ORDER — HYDROCODONE-ACETAMINOPHEN 5-325 MG PO TABS: 1.0000 | ORAL_TABLET | Freq: Four times a day (QID) | ORAL | 0 refills | Status: DC | PRN

## 2016-10-11 NOTE — Progress Notes (Signed)
   Subjective:    Patient ID: Raymond Walker., male    DOB: 08-Aug-1951, 64 y.o.   MRN: [id number]  Dental Pain   This is a new problem. The current episode started more than 1 month ago.  The patient relates that he is having a fair amount of dental pain his tooth fell out but start to feel better he also relates a lot of back pain and hip pain for which she seen the [place] and he requests to have some hydrocodone until he can see the [place] later this month. He denies abusing it. Patient in today for dental pain. Patient has since lost tooth at home that was causing pain.   Review of Systems    relates back pain and hip pain also dental pain Objective:   Physical Exam Supportive teeth no dental infection noted patient with stiffness in the hips and low back  The patient was counseled to quit smoking he was also [facility] to figure out why 7 the back discomfort more likely arthralgias may need further testing based on [place]     Assessment & Plan:  Patient states her get this thoroughly worked up at the [place]. Does not one have any testing.  Pain medicine prescribed patient was cautioned about drowsiness and not overusing the medication

## 2016-10-12 ENCOUNTER — Ambulatory Visit (HOSPITAL_COMMUNITY)
Admission: RE | Admit: 2016-10-12 | Discharge: 2016-10-12 | Disposition: A | Discharge status: Home / Self Care | Payer: Medicare Other | Source: Ambulatory Visit | Attending: Family Medicine | Admitting: Family Medicine

## 2016-10-12 DIAGNOSIS — I2510 Atherosclerotic heart disease of native coronary artery without angina pectoris: Secondary | ICD-10-CM | POA: Diagnosis not present

## 2016-10-12 DIAGNOSIS — I700 Atherosclerosis of aorta: Secondary | ICD-10-CM | POA: Insufficient documentation

## 2016-10-12 DIAGNOSIS — R918 Other nonspecific abnormal finding of lung field: Secondary | ICD-10-CM | POA: Diagnosis not present

## 2016-10-12 DIAGNOSIS — J432 Centrilobular emphysema: Secondary | ICD-10-CM | POA: Diagnosis not present

## 2016-10-12 DIAGNOSIS — F17200 Nicotine dependence, unspecified, uncomplicated: Secondary | ICD-10-CM | POA: Insufficient documentation

## 2016-10-12 DIAGNOSIS — Z136 Encounter for screening for cardiovascular disorders: Secondary | ICD-10-CM | POA: Diagnosis not present

## 2016-10-12 LAB — POCT I-STAT CREATININE: Creatinine, Ser: 0.9 mg/dL (ref 0.61–1.24)

## 2016-10-12 MED ORDER — IOPAMIDOL (ISOVUE-300) INJECTION 61%
75.0000 mL | Freq: Once | INTRAVENOUS | Status: AC | PRN
  Administered 2016-10-12: 75 mL via INTRAVENOUS

## 2016-10-14 ENCOUNTER — Encounter: Payer: Self-pay | Admitting: Family Medicine

## 2016-10-14 DIAGNOSIS — J438 Other emphysema: Secondary | ICD-10-CM | POA: Insufficient documentation

## 2016-10-14 DIAGNOSIS — I2510 Atherosclerotic heart disease of native coronary artery without angina pectoris: Secondary | ICD-10-CM | POA: Insufficient documentation

## 2016-10-14 DIAGNOSIS — I700 Atherosclerosis of aorta: Secondary | ICD-10-CM | POA: Insufficient documentation

## 2017-06-16 IMAGING — DX DG CHEST 2V
2 series · 2 of 2 positions shown · non-contrast
Comparison: 04/29/2015

CLINICAL DATA: Hemoptysis.  Cough.

EXAM:
CHEST  2 VIEW

[chest pa]
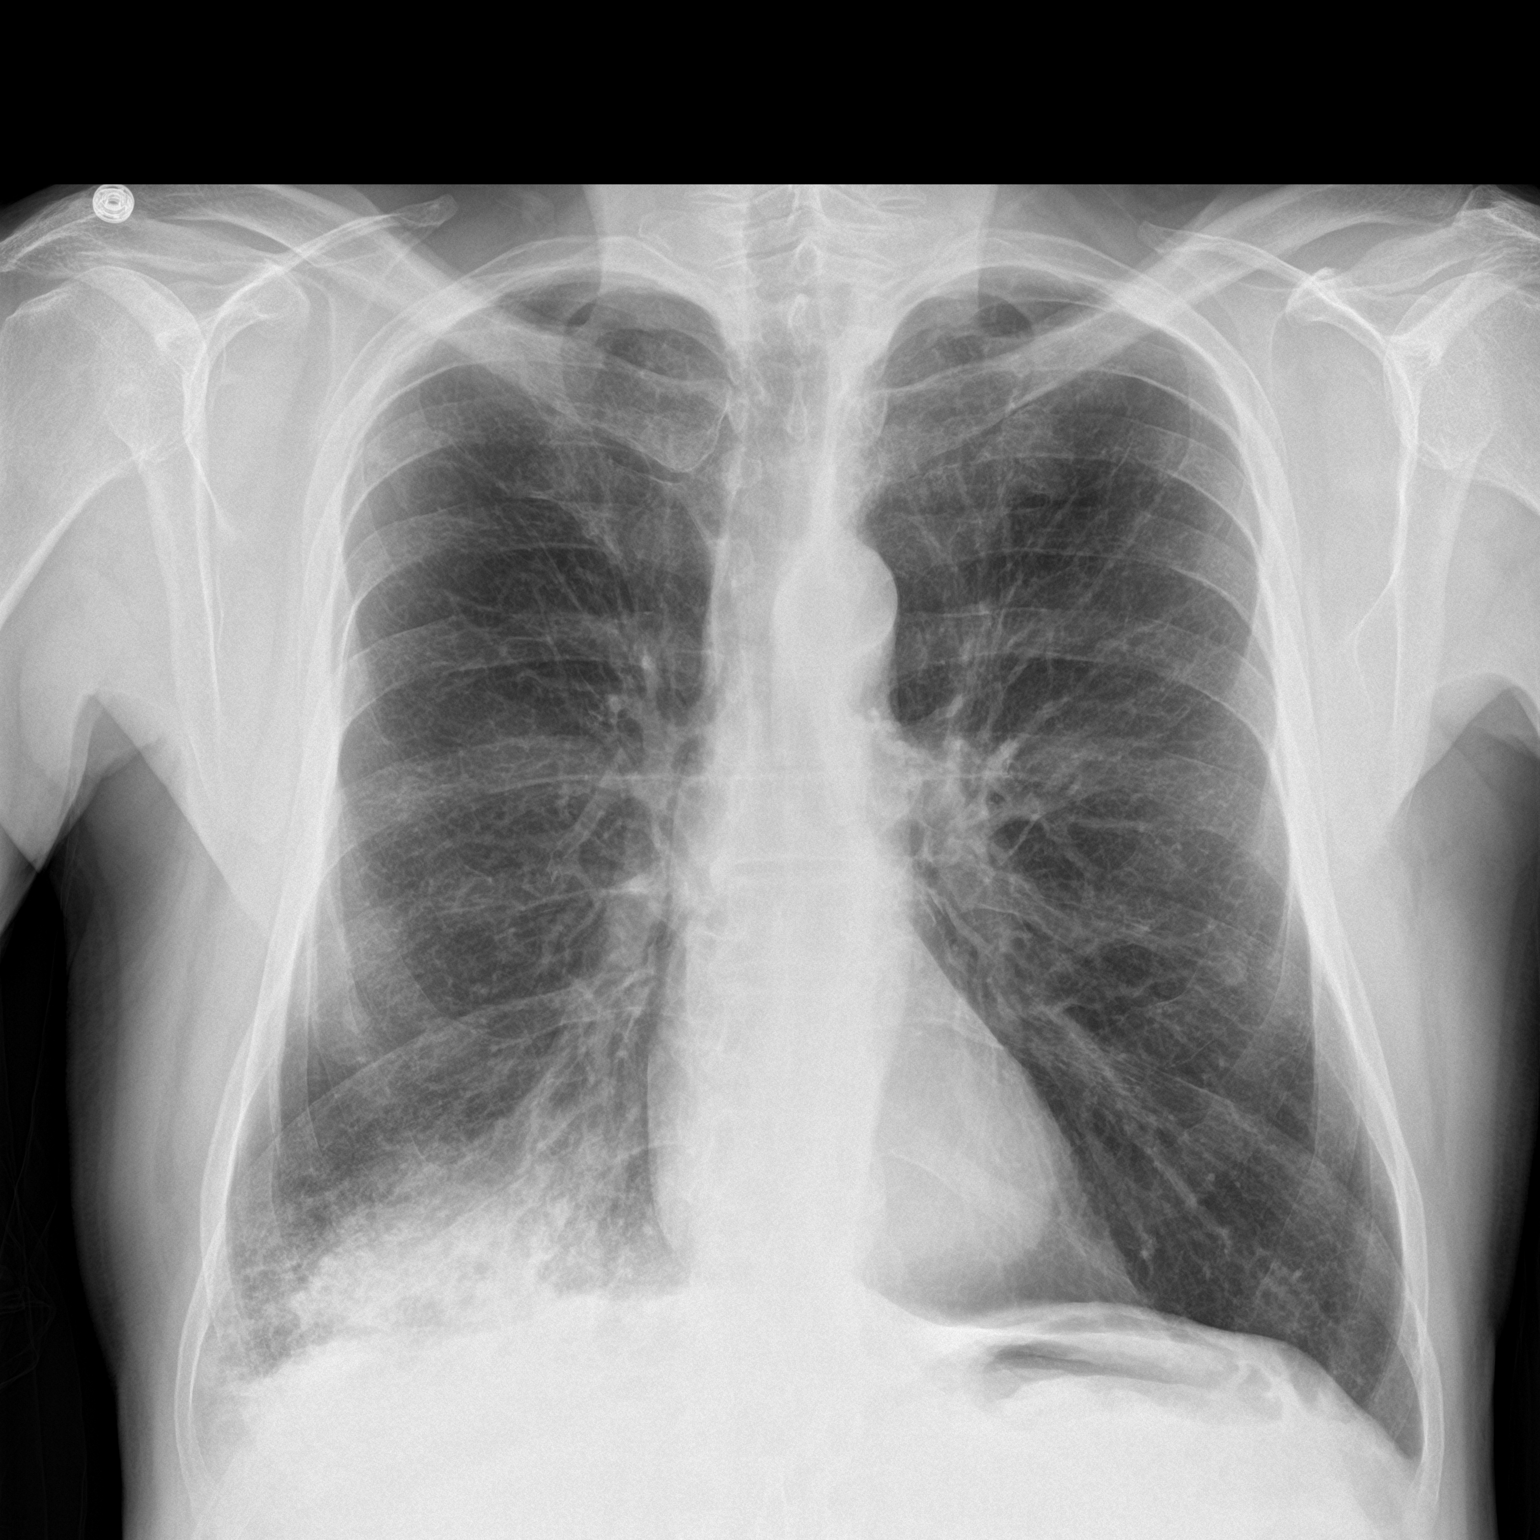

[chest lat]
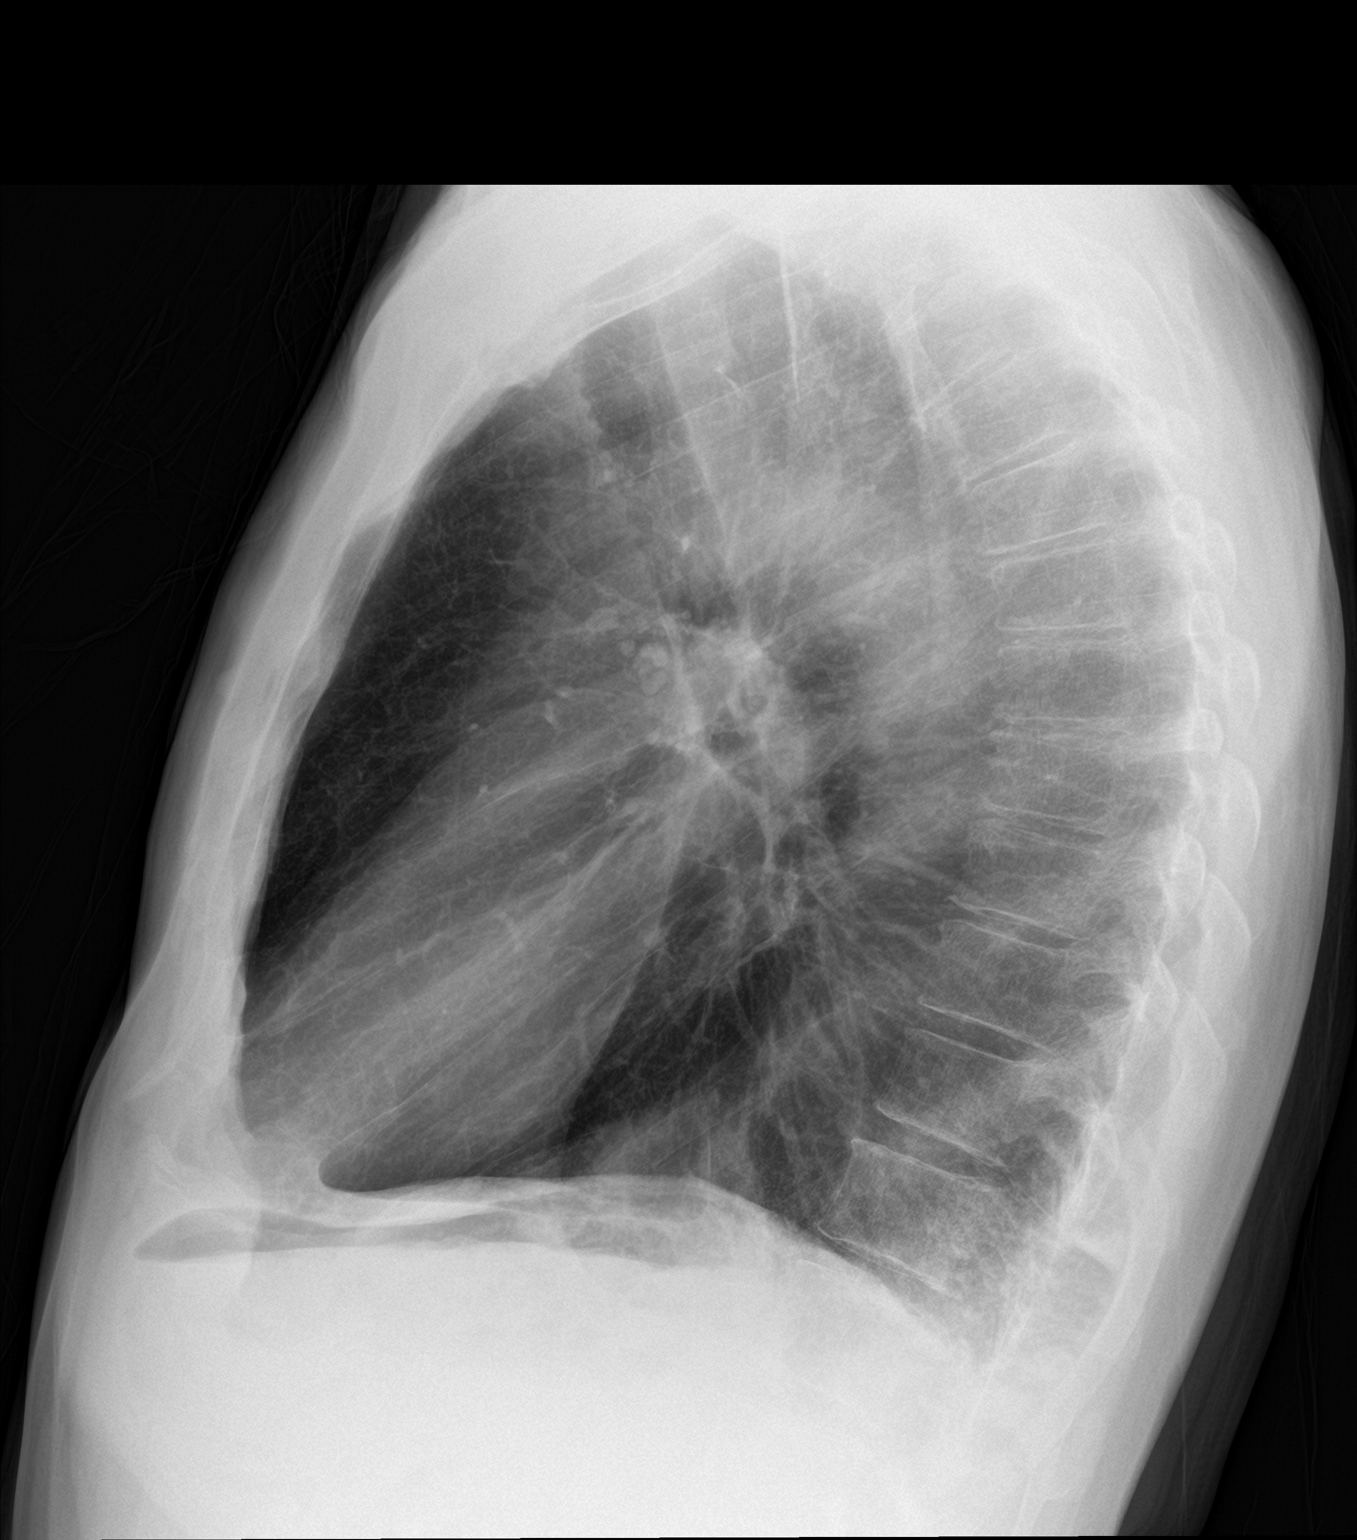

[2 of 2 positions shown; findings below may reference images not displayed]

FINDINGS: COPD with pulmonary hyperinflation

Interval development of infiltrate in the right lower lobe
posteriorly, consistent with pneumonia. This was not present
previously. No adenopathy. Minimal right effusion. Left lung is
clear.
IMPRESSION: COPD with right lower lobe infiltrate consistent with pneumonia.

## 2017-06-23 ENCOUNTER — Emergency Department (HOSPITAL_COMMUNITY)
Admission: EM | Admit: 2017-06-23 | Discharge: 2017-06-23 | Disposition: A | Discharge status: Home / Self Care | Payer: Medicare Other | Attending: Emergency Medicine | Admitting: Emergency Medicine

## 2017-06-23 ENCOUNTER — Encounter (HOSPITAL_COMMUNITY): Payer: Self-pay | Admitting: Cardiology

## 2017-06-23 ENCOUNTER — Emergency Department (HOSPITAL_COMMUNITY): Payer: Medicare Other

## 2017-06-23 DIAGNOSIS — K529 Noninfective gastroenteritis and colitis, unspecified: Secondary | ICD-10-CM | POA: Diagnosis not present

## 2017-06-23 DIAGNOSIS — K861 Other chronic pancreatitis: Secondary | ICD-10-CM | POA: Diagnosis not present

## 2017-06-23 DIAGNOSIS — F17210 Nicotine dependence, cigarettes, uncomplicated: Secondary | ICD-10-CM | POA: Insufficient documentation

## 2017-06-23 DIAGNOSIS — I700 Atherosclerosis of aorta: Secondary | ICD-10-CM | POA: Diagnosis not present

## 2017-06-23 DIAGNOSIS — J449 Chronic obstructive pulmonary disease, unspecified: Secondary | ICD-10-CM | POA: Diagnosis not present

## 2017-06-23 DIAGNOSIS — R1030 Lower abdominal pain, unspecified: Secondary | ICD-10-CM | POA: Diagnosis present

## 2017-06-23 DIAGNOSIS — N200 Calculus of kidney: Secondary | ICD-10-CM | POA: Diagnosis not present

## 2017-06-23 LAB — COMPREHENSIVE METABOLIC PANEL
ALT: 12 U/L — ABNORMAL LOW (ref 17–63)
AST: 14 U/L — ABNORMAL LOW (ref 15–41)
Albumin: 4 g/dL (ref 3.5–5.0)
Alkaline Phosphatase: 89 U/L (ref 38–126)
Anion gap: 10 (ref 5–15)
BUN: 9 mg/dL (ref 6–20)
CO2: 29 mmol/L (ref 22–32)
Calcium: 9.6 mg/dL (ref 8.9–10.3)
Chloride: 104 mmol/L (ref 101–111)
Creatinine, Ser: 0.76 mg/dL (ref 0.61–1.24)
GFR calc Af Amer: >60 mL/min (ref >60)
GFR calc non Af Amer: >60 mL/min (ref >60)
Glucose, Bld: 93 mg/dL (ref 65–99)
Potassium: 4 mmol/L (ref 3.5–5.1)
Sodium: 143 mmol/L (ref 135–145)
Total Bilirubin: 0.6 mg/dL (ref 0.3–1.2)
Total Protein: 7.8 g/dL (ref 6.5–8.1)

## 2017-06-23 LAB — CBC WITH DIFFERENTIAL/PLATELET
Basophils Absolute: 0 10*3/uL (ref 0.0–0.1)
Basophils Relative: 0 %
Eosinophils Absolute: 0.1 10*3/uL (ref 0.0–0.7)
Eosinophils Relative: 2 %
HCT: 47.9 % (ref 39.0–52.0)
Hemoglobin: 16.5 g/dL (ref 13.0–17.0)
Lymphocytes Relative: 32 %
Lymphs Abs: 2.1 10*3/uL (ref 0.7–4.0)
MCH: 30.9 pg (ref 26.0–34.0)
MCHC: 34.4 g/dL (ref 30.0–36.0)
MCV: 89.7 fL (ref 78.0–100.0)
Monocytes Absolute: 0.6 10*3/uL (ref 0.1–1.0)
Monocytes Relative: 9 %
Neutro Abs: 3.7 10*3/uL (ref 1.7–7.7)
Neutrophils Relative %: 57 %
Platelets: 98 10*3/uL — ABNORMAL LOW (ref 150–400)
RBC: 5.34 MIL/uL (ref 4.22–5.81)
RDW: 13.8 % (ref 11.5–15.5)
WBC: 6.5 10*3/uL (ref 4.0–10.5)

## 2017-06-23 LAB — URINALYSIS, ROUTINE W REFLEX MICROSCOPIC
Bacteria, UA: NONE SEEN
Bilirubin Urine: NEGATIVE
Glucose, UA: NEGATIVE mg/dL
Ketones, ur: NEGATIVE mg/dL
Nitrite: NEGATIVE
Protein, ur: NEGATIVE mg/dL
Specific Gravity, Urine: 1.005 (ref 1.005–1.030)
pH: 6 (ref 5.0–8.0)

## 2017-06-23 LAB — LIPASE, BLOOD: Lipase: 37 U/L (ref 11–51)

## 2017-06-23 MED ORDER — CIPROFLOXACIN IN D5W 400 MG/200ML IV SOLN
400.0000 mg | Freq: Once | INTRAVENOUS | Status: AC
  Administered 2017-06-23: 400 mg via INTRAVENOUS
  Filled 2017-06-23: qty 200

## 2017-06-23 MED ORDER — METRONIDAZOLE 500 MG PO TABS: 500.0000 mg | ORAL_TABLET | Freq: Two times a day (BID) | ORAL | 0 refills | Status: DC

## 2017-06-23 MED ORDER — IOPAMIDOL (ISOVUE-300) INJECTION 61%
100.0000 mL | Freq: Once | INTRAVENOUS | Status: AC | PRN
  Administered 2017-06-23: 100 mL via INTRAVENOUS

## 2017-06-23 MED ORDER — CIPROFLOXACIN HCL 500 MG PO TABS: 500.0000 mg | ORAL_TABLET | Freq: Two times a day (BID) | ORAL | 0 refills | Status: DC

## 2017-06-23 MED ORDER — LISINOPRIL 10 MG PO TABS: 10.0000 mg | ORAL_TABLET | Freq: Every day | ORAL | 1 refills | Status: DC

## 2017-06-23 MED ORDER — METRONIDAZOLE 500 MG PO TABS
500.0000 mg | ORAL_TABLET | Freq: Once | ORAL | Status: AC
  Administered 2017-06-23: 500 mg via ORAL
  Filled 2017-06-23: qty 1

## 2017-06-23 MED ORDER — HYDROCODONE-ACETAMINOPHEN 5-325 MG PO TABS: 2.0000 | ORAL_TABLET | ORAL | 0 refills | Status: DC | PRN

## 2017-06-23 MED ORDER — SODIUM CHLORIDE 0.9 % IV SOLN
INTRAVENOUS | Status: DC
  Administered 2017-06-23: 17:00:00 via INTRAVENOUS

## 2017-06-23 NOTE — ED Provider Notes (Signed)
[facility] DEPT Provider Note   CSN: [id number] Arrival date & time: 06/23/17  1516     History   Chief Complaint Chief Complaint  Patient presents with  . Abdominal Pain    HPI Raymond Walker. is a 66 y.o. male.  HPI  The patient is a 66 year old male with a known history of COPD as well as a history of recurrent pancreatitis, multiple kidney stones, he does not drink alcohol in over 30 years but still smokes cigarettes. He reports approximately 3 or 4 days of persistent and gradually worsening left lower quadrant and suprapubic located pain. He states that this is persistent throughout the day, it is associated with difficulty urinating stating that he has a dribble when he tries to urinate but is unable to maintain a stream. He is urinating frequently but has no dysuria and no fevers chills nausea or vomiting. He also has associated loose brown mucus-like stools which is abnormal for him and has between 5 and 10 stools per day. He denies any groin pain, penile pain, scrotal pain. He has also not had any chest pain or shortness of breath though he does have a frequent chronic cough related to his COPD. Nothing seems to make this better, it gets worse when he palpates his abdomen. He does have a history of cholecystitis but has never had any other abdominal surgery.  Past Medical History:  Diagnosis Date  . COPD (chronic obstructive pulmonary disease) ([place])   . Erectile dysfunction   . Impaired glucose tolerance   . Nephrolithiasis   . Pancreatitis, recurrent [facility])     Patient Active Problem List   Diagnosis Date Noted  . Aortic atherosclerosis ([place]) 10/14/2016  . Coronary artery calcification seen on CAT scan 10/14/2016  . Other emphysema ([place]) 10/14/2016  . Elevated blood pressure 12/03/2011  . Chest pain 12/03/2011  . Tobacco abuse 12/03/2011  . Right leg pain 12/03/2011    Past Surgical History:  Procedure Laterality Date  . CHOLECYSTECTOMY    . ORCHIECTOMY      . VASECTOMY         Home Medications    Prior to Admission medications   Medication Sig Start Date End Date Taking? Authorizing Provider  acetaminophen (TYLENOL) 500 MG tablet Take 500 mg by mouth 2 (two) times daily.    Yes [provider]  ciprofloxacin (CIPRO) 500 MG tablet Take 1 tablet (500 mg total) by mouth every 12 (twelve) hours. 06/23/17   Noemi Chapel, MD  HYDROcodone-acetaminophen (NORCO/VICODIN) 5-325 MG tablet Take 2 tablets by mouth every 4 (four) hours as needed. 06/23/17   Noemi Chapel, MD  lisinopril (PRINIVIL,ZESTRIL) 10 MG tablet Take 1 tablet (10 mg total) by mouth daily. 06/23/17   Noemi Chapel, MD  metroNIDAZOLE (FLAGYL) 500 MG tablet Take 1 tablet (500 mg total) by mouth 2 (two) times daily. 06/23/17   Noemi Chapel, MD    Family History Family History  Problem Relation Age of Onset  . Hypertension Father   . Coronary artery disease Father   . Coronary artery disease Mother   . Cancer Mother        Breast    Social History Social History  Substance Use Topics  . Smoking status: Current Every Day Smoker    Packs/day: 1.50    Types: Cigarettes  . Smokeless tobacco: Never Used  . Alcohol use No     Allergies   Patient has no known allergies.   Review of Systems Review of  Systems  Constitutional: Negative for chills and fever.  HENT: Negative for sore throat.   Eyes: Negative for visual disturbance.  Respiratory: Negative for cough and shortness of breath.   Cardiovascular: Negative for chest pain.  Gastrointestinal: Positive for abdominal pain and diarrhea. Negative for abdominal distention, nausea and vomiting.  Genitourinary: Positive for difficulty urinating and frequency. Negative for dysuria.  Musculoskeletal: Negative for back pain and neck pain.  Skin: Negative for rash.  Neurological: Negative for weakness, numbness and headaches.  Hematological: Negative for adenopathy.  Psychiatric/Behavioral: Negative for behavioral  problems.     Physical Exam Updated Vital Signs BP (!) 190/89   Pulse 66   Temp 97.6 F (36.4 C) (Oral)   Resp 18   Ht 6' (1.829 m)   Wt 76.2 kg (168 lb)   SpO2 100%   BMI 22.78 kg/m   Physical Exam  Constitutional: He appears well-developed and well-nourished. No distress.  HENT:  Head: Normocephalic and atraumatic.  Mouth/Throat: Oropharynx is clear and moist. No oropharyngeal exudate.  Eyes: Conjunctivae and EOM are normal. Pupils are equal, round, and reactive to light. Right eye exhibits no discharge. Left eye exhibits no discharge. No scleral icterus.  Neck: Normal range of motion. Neck supple. No JVD present. No thyromegaly present.  Cardiovascular: Normal rate, regular rhythm, normal heart sounds and intact distal pulses.  Exam reveals no gallop and no friction rub.   No murmur heard. Pulmonary/Chest: Effort normal. No respiratory distress. He has wheezes ( Mild end expiratory wheezing, no increased work of breathing). He has no rales.  Abdominal: Soft. He exhibits no distension and no mass. There is tenderness.  Thin abdomen, no masses palpated, no tenderness in the left upper, right upper or right lower quadrants, no periumbilical tenderness, mild tenderness in the suprapubic and left lower quadrant regions with mild guarding. He has increased bowel sounds diffusely  Genitourinary:  Genitourinary Comments: Normal appearing penis scrotum and testicles, there is an abnormally shaped mole in the suprapubic area which the patient was informed of and encouraged to follow-up with a dermatologist. No signs of hernias, no tenderness in the inguinal regions, no lymphadenopathy  Musculoskeletal: Normal range of motion. He exhibits no edema or tenderness.  Lymphadenopathy:    He has no cervical adenopathy.  Neurological: He is alert. Coordination normal.  Skin: Skin is warm and dry. No rash noted. No erythema.  Psychiatric: He has a normal mood and affect. His behavior is normal.   Nursing note and vitals reviewed.    ED Treatments / Results  Labs (all labs ordered are listed, but only abnormal results are displayed) Labs Reviewed  CBC WITH DIFFERENTIAL/PLATELET - Abnormal; Notable for the following:       Result Value   Platelets 98 (*)    All other components within normal limits  COMPREHENSIVE METABOLIC PANEL - Abnormal; Notable for the following:    AST 14 (*)    ALT 12 (*)    All other components within normal limits  URINALYSIS, ROUTINE W REFLEX MICROSCOPIC - Abnormal; Notable for the following:    Hgb urine dipstick MODERATE (*)    Leukocytes, UA TRACE (*)    Squamous Epithelial / LPF 0-5 (*)    All other components within normal limits  LIPASE, BLOOD    Radiology Ct Abdomen Pelvis W Contrast  Result Date: 06/23/2017 CLINICAL DATA:  Left lower quadrant pain for 4 days. History of pancreatitis, nephrolithiasis, cholecystectomy. Evaluate for diverticulitis. EXAM: CT ABDOMEN AND PELVIS WITH CONTRAST  TECHNIQUE: Multidetector CT imaging of the abdomen and pelvis was performed using the standard protocol following bolus administration of intravenous contrast. CONTRAST:  142mL ISOVUE-300 IOPAMIDOL (ISOVUE-300) INJECTION 61% COMPARISON:  MRI of the abdomen November 27, 2011 and CT abdomen report dated January 22, 2014 though images are not available for direct comparison. FINDINGS: LOWER CHEST: Lung bases are clear. Included heart size is normal. Mild coronary artery calcifications. No pericardial effusion. No pericardial effusion. HEPATOBILIARY: Mild focal fatty infiltration about the falciform ligament, liver is otherwise unremarkable. Status post cholecystectomy. PANCREAS: Normal. SPLEEN: Normal. ADRENALS/URINARY TRACT: Kidneys are orthotopic, demonstrating symmetric enhancement. LEFT upper pole 3.1 x 3 cm cyst with marginal calcifications and scarring was 4.2 x 4.3 cm. Numerous additional stable benign-appearing renal cysts measuring to 4.2 cm. Bilateral  nephrolithiasis measure 4 mm additional too small to characterize hypodensities. No renal masses. The unopacified ureters are normal in course and caliber. Delayed imaging through the kidneys demonstrates symmetric prompt contrast excretion within the proximal urinary collecting system. Urinary bladder is partially distended harboring no intravesicular calculi. Mildly thickened LEFT adrenal gland without focal nodule. STOMACH/BOWEL: Segmental mild colonic wall thickening and pericolonic inflammation most apparent within the sigmoid colon. The stomach, small bowel are normal in course and caliber without inflammatory changes. Normal appendix. VASCULAR/LYMPHATIC: Aortoiliac vessels are normal in course and caliber, severe intimal thickening and moderate calcific atherosclerosis. Atherosclerosis resulting in moderate stenosis mid to distal SMA, not tailored for evaluation. No lymphadenopathy by CT size criteria. REPRODUCTIVE: Prostate is enlarged, 6.1 x 5.2 cm invading the base the bladder. OTHER: No intraperitoneal free fluid or free air. MUSCULOSKELETAL: Nonacute. Small fat containing LEFT inguinal hernia. IMPRESSION: 1. Mild colitis, no complication. 2. Nonobstructing bilateral nephrolithiasis. 3. Prostatomegaly. Aortic Atherosclerosis (ICD10-I70.0). Electronically Signed   By: Elon Alas M.D.   On: 06/23/2017 18:44    Procedures Procedures (including critical care time)  Medications Ordered in ED Medications  0.9 %  sodium chloride infusion ( Intravenous New Bag/Given 06/23/17 1634)  iopamidol (ISOVUE-300) 61 % injection 100 mL (100 mLs Intravenous Contrast Given 06/23/17 1803)  ciprofloxacin (CIPRO) IVPB 400 mg (400 mg Intravenous New Bag/Given 06/23/17 1910)  metroNIDAZOLE (FLAGYL) tablet 500 mg (500 mg Oral Given 06/23/17 1910)     Initial Impression / Assessment and Plan / ED Course  I have reviewed the triage vital signs and the nursing notes.  Pertinent labs & imaging results that were  available during my care of the patient were reviewed by me and considered in my medical decision making (see chart for details).     The patient has what appears to be consistent with possible diverticulitis, appendicitis or possibly just a urinary tract infection. He does report one distant history of a urinary  tract infection over 30 years ago. We'll obtain labs, urinalysis, CT scan of the abdomen and pelvis to rule out a significant source such as diverticulitis or surgical scar is such as appendicitis. The patient is in agreement with the plan. He has been made nothing by mouth. IV fluids will be given. He declines pain medicines at this time  CT shows colitis No other acute findings including no UTI and no leukocytosis No risk for C dif including no recent abx and no recent exposures to other with same Pt given all results and expressed his understanding. Appears stable for d/c. Vitals with elevated BP - Lisionpril started Pt made aware.    .Final Clinical Impressions(s) / ED Diagnoses   Final diagnoses:  Acute colitis  New Prescriptions New Prescriptions   CIPROFLOXACIN (CIPRO) 500 MG TABLET    Take 1 tablet (500 mg total) by mouth every 12 (twelve) hours.   HYDROCODONE-ACETAMINOPHEN (NORCO/VICODIN) 5-325 MG TABLET    Take 2 tablets by mouth every 4 (four) hours as needed.   LISINOPRIL (PRINIVIL,ZESTRIL) 10 MG TABLET    Take 1 tablet (10 mg total) by mouth daily.   METRONIDAZOLE (FLAGYL) 500 MG TABLET    Take 1 tablet (500 mg total) by mouth 2 (two) times daily.     Noemi Chapel, MD 06/23/17 2024

## 2017-06-23 NOTE — ED Notes (Signed)
Patient transported to CT 

## 2017-06-23 NOTE — ED Triage Notes (Signed)
Lower abdominal pain with diarrhea times 3-4 days.

## 2017-06-23 NOTE — ED Notes (Signed)
Pt alert & oriented x4, stable gait. Patient given discharge instructions, paperwork & prescription(s). Patient informed not to drive, operate any equipment & handel any important documents 4 hours after taking pain medication. Patient  instructed to stop at the registration desk to finish any additional paperwork. Patient  verbalized understanding. Pt left department w/ no further questions. 

## 2017-06-23 NOTE — Discharge Instructions (Signed)
Please obtain all of your results from medical records or have your doctors office obtain the results - share them with your doctor - you should be seen at your doctors office in the next 2 days. Call today to arrange your follow up. Take the medications as prescribed. Please review all of the medicines and only take them if you do not have an allergy to them. Please be aware that if you are taking birth control pills, taking other prescriptions, ESPECIALLY ANTIBIOTICS may make the birth control ineffective - if this is the case, either do not engage in sexual activity or use alternative methods of birth control such as condoms until you have finished the medicine and your family doctor says it is OK to restart them. If you are on a blood thinner such as COUMADIN, be aware that any other medicine that you take may cause the coumadin to either work too much, or not enough - you should have your coumadin level rechecked in next 7 days if this is the case.  ?  It is also a possibility that you have an allergic reaction to any of the medicines that you have been prescribed - Everybody reacts differently to medications and while MOST people have no trouble with most medicines, you may have a reaction such as nausea, vomiting, rash, swelling, shortness of breath. If this is the case, please stop taking the medicine immediately and contact your physician.  ?  You should return to the ER if you develop severe or worsening symptoms.    Cipro and Flagyl - twice daily with food and water Hydrocodone only for severe pain ER for worsening symptoms Your blood pressure has been high - start Lisinopril daily for your high blood pressure.

## 2017-07-31 ENCOUNTER — Encounter (HOSPITAL_COMMUNITY): Payer: Self-pay | Admitting: Emergency Medicine

## 2017-07-31 ENCOUNTER — Emergency Department (HOSPITAL_COMMUNITY)
Admission: EM | Admit: 2017-07-31 | Discharge: 2017-08-01 | Disposition: A | Discharge status: Home / Self Care | Payer: Medicare Other | Attending: Emergency Medicine | Admitting: Emergency Medicine

## 2017-07-31 DIAGNOSIS — I10 Essential (primary) hypertension: Secondary | ICD-10-CM | POA: Diagnosis not present

## 2017-07-31 DIAGNOSIS — Z79899 Other long term (current) drug therapy: Secondary | ICD-10-CM | POA: Insufficient documentation

## 2017-07-31 DIAGNOSIS — K029 Dental caries, unspecified: Secondary | ICD-10-CM | POA: Diagnosis not present

## 2017-07-31 DIAGNOSIS — J449 Chronic obstructive pulmonary disease, unspecified: Secondary | ICD-10-CM | POA: Insufficient documentation

## 2017-07-31 DIAGNOSIS — I259 Chronic ischemic heart disease, unspecified: Secondary | ICD-10-CM | POA: Diagnosis not present

## 2017-07-31 DIAGNOSIS — F17210 Nicotine dependence, cigarettes, uncomplicated: Secondary | ICD-10-CM | POA: Diagnosis not present

## 2017-07-31 DIAGNOSIS — K0889 Other specified disorders of teeth and supporting structures: Secondary | ICD-10-CM | POA: Diagnosis present

## 2017-07-31 NOTE — ED Triage Notes (Signed)
Pt c/o dental pain x 2 days and has appt with dentist Monday.

## 2017-08-01 DIAGNOSIS — K029 Dental caries, unspecified: Secondary | ICD-10-CM | POA: Diagnosis not present

## 2017-08-01 MED ORDER — HYDROCODONE-ACETAMINOPHEN 5-325 MG PO TABS: 1.0000 | ORAL_TABLET | ORAL | 0 refills | Status: DC | PRN

## 2017-08-01 MED ORDER — CLINDAMYCIN HCL 150 MG PO CAPS
300.0000 mg | ORAL_CAPSULE | Freq: Once | ORAL | Status: AC
  Administered 2017-08-01: 300 mg via ORAL
  Filled 2017-08-01: qty 2

## 2017-08-01 MED ORDER — ONDANSETRON HCL 4 MG PO TABS
4.0000 mg | ORAL_TABLET | Freq: Once | ORAL | Status: AC
  Administered 2017-08-01: 4 mg via ORAL
  Filled 2017-08-01: qty 1

## 2017-08-01 MED ORDER — AMOXICILLIN 500 MG PO CAPS: 500.0000 mg | ORAL_CAPSULE | Freq: Three times a day (TID) | ORAL | 0 refills | Status: DC

## 2017-08-01 NOTE — Discharge Instructions (Signed)
Vital signs within normal limits. Please use Amoxil 3 times daily with food. Please use Norco one or 2 tablets every 4 hours as needed for pain. This medication may cause drowsiness. Please do not drive, operate machinery, or participate in activities requiring concentration when taking this medication. Please see your dentist as scheduled.

## 2017-08-01 NOTE — ED Provider Notes (Signed)
[facility] DEPT Provider Note   CSN: [id number] Arrival date & time: 07/31/17  2219     History   Chief Complaint Chief Complaint  Patient presents with  . Dental Pain    HPI Raymond Walker. is a 66 y.o. male.  The history is provided by the patient.  Dental Pain   This is a chronic problem. The current episode started yesterday. The problem occurs hourly. The problem has been gradually worsening. The pain is moderate. He has tried acetaminophen for the symptoms. The treatment provided no relief.    Past Medical History:  Diagnosis Date  . COPD (chronic obstructive pulmonary disease) ([place])   . Erectile dysfunction   . Impaired glucose tolerance   . Nephrolithiasis   . Pancreatitis, recurrent [facility])     Patient Active Problem List   Diagnosis Date Noted  . Aortic atherosclerosis ([place]) 10/14/2016  . Coronary artery calcification seen on CAT scan 10/14/2016  . Other emphysema ([place]) 10/14/2016  . Elevated blood pressure 12/03/2011  . Chest pain 12/03/2011  . Tobacco abuse 12/03/2011  . Right leg pain 12/03/2011    Past Surgical History:  Procedure Laterality Date  . CHOLECYSTECTOMY    . ORCHIECTOMY    . VASECTOMY         Home Medications    Prior to Admission medications   Medication Sig Start Date End Date Taking? Authorizing Provider  acetaminophen (TYLENOL) 500 MG tablet Take 500 mg by mouth 2 (two) times daily.     [provider]  ciprofloxacin (CIPRO) 500 MG tablet Take 1 tablet (500 mg total) by mouth every 12 (twelve) hours. 06/23/17   Noemi Chapel, MD  HYDROcodone-acetaminophen (NORCO/VICODIN) 5-325 MG tablet Take 2 tablets by mouth every 4 (four) hours as needed. 06/23/17   Noemi Chapel, MD  lisinopril (PRINIVIL,ZESTRIL) 10 MG tablet Take 1 tablet (10 mg total) by mouth daily. 06/23/17   Noemi Chapel, MD  metroNIDAZOLE (FLAGYL) 500 MG tablet Take 1 tablet (500 mg total) by mouth 2 (two) times daily. 06/23/17   Noemi Chapel, MD    Family  History Family History  Problem Relation Age of Onset  . Hypertension Father   . Coronary artery disease Father   . Coronary artery disease Mother   . Cancer Mother        Breast    Social History Social History  Substance Use Topics  . Smoking status: Current Every Day Smoker    Packs/day: 1.50    Types: Cigarettes  . Smokeless tobacco: Never Used  . Alcohol use No     Allergies   Patient has no known allergies.   Review of Systems Review of Systems  Constitutional: Negative for activity change.       All ROS Neg except as noted in HPI  HENT: Positive for dental problem. Negative for nosebleeds.   Eyes: Negative for photophobia and discharge.  Respiratory: Negative for cough, shortness of breath and wheezing.   Cardiovascular: Negative for chest pain and palpitations.  Gastrointestinal: Negative for abdominal pain and blood in stool.  Genitourinary: Negative for dysuria, frequency and hematuria.  Musculoskeletal: Negative for arthralgias, back pain and neck pain.  Skin: Negative.   Neurological: Negative for dizziness, seizures and speech difficulty.  Psychiatric/Behavioral: Negative for confusion and hallucinations.     Physical Exam Updated Vital Signs BP (!) 113/49   Pulse 77   Temp 98.2 F (36.8 C)   Resp 20   Ht 6' (1.829 m)  Wt 80.7 kg (178 lb)   SpO2 95%   BMI 24.14 kg/m   Physical Exam  Constitutional: He is oriented to person, place, and time. He appears well-developed and well-nourished.  Non-toxic appearance.  HENT:  Head: Normocephalic.  Right Ear: Tympanic membrane and external ear normal.  Left Ear: Tympanic membrane and external ear normal.  Multiple teeth are missing. The right lower canine is loose. The gum in this area is swollen and tender to touch. The premolar beside this area is severely decayed. There is no swelling under the tongue. The airway is patent.  Eyes: Pupils are equal, round, and reactive to light. EOM and lids are  normal.  Neck: Normal range of motion. Neck supple. Carotid bruit is not present.  Cardiovascular: Normal rate, regular rhythm, normal heart sounds, intact distal pulses and normal pulses.   Pulmonary/Chest: Breath sounds normal. No respiratory distress.  Abdominal: Soft. Bowel sounds are normal. There is no tenderness. There is no guarding.  Musculoskeletal: Normal range of motion.  Lymphadenopathy:       Head (right side): No submandibular adenopathy present.       Head (left side): No submandibular adenopathy present.    He has no cervical adenopathy.  Neurological: He is alert and oriented to person, place, and time. He has normal strength. No cranial nerve deficit or sensory deficit.  Skin: Skin is warm and dry.  Psychiatric: He has a normal mood and affect. His speech is normal.  Nursing note and vitals reviewed.    ED Treatments / Results  Labs (all labs ordered are listed, but only abnormal results are displayed) Labs Reviewed - No data to display  EKG  EKG Interpretation None       Radiology No results found.  Procedures Procedures (including critical care time)  Medications Ordered in ED Medications - No data to display   Initial Impression / Assessment and Plan / ED Course  I have reviewed the triage vital signs and the nursing notes.  Pertinent labs & imaging results that were available during my care of the patient were reviewed by me and considered in my medical decision making (see chart for details).       Final Clinical Impressions(s) / ED Diagnoses MDM Patient has pain and swelling of the lower gum. There are multiple cavities and some loose teeth present. No emergent changes noted. No evidence for Ludwig's angina.Patient is treated with antibiotic and pain medication. Patient states he has an appointment with his dentist in 2 days.   Final diagnoses:  Dental caries    New Prescriptions Discharge Medication List as of 08/01/2017 12:13 AM      START taking these medications   Details  amoxicillin (AMOXIL) 500 MG capsule Take 1 capsule (500 mg total) by mouth 3 (three) times daily., Starting Thu 08/01/2017, Print         Lily Kocher, PA-C 08/02/17 1203    Rolland Porter, MD 08/06/17 [phone]

## 2017-08-05 MED FILL — Hydrocodone-Acetaminophen Tab 5-325 MG: ORAL | Qty: 6 | Status: AC

## 2018-02-13 DIAGNOSIS — H35032 Hypertensive retinopathy, left eye: Secondary | ICD-10-CM | POA: Diagnosis not present

## 2018-02-13 DIAGNOSIS — H43812 Vitreous degeneration, left eye: Secondary | ICD-10-CM | POA: Diagnosis not present

## 2018-02-13 DIAGNOSIS — H25813 Combined forms of age-related cataract, bilateral: Secondary | ICD-10-CM | POA: Diagnosis not present

## 2018-02-21 ENCOUNTER — Encounter (INDEPENDENT_AMBULATORY_CARE_PROVIDER_SITE_OTHER): Payer: Medicare Other | Admitting: Ophthalmology

## 2018-02-21 NOTE — Progress Notes (Signed)
[facility] Note  02/24/2018     CHIEF COMPLAINT Patient presents for Retina Evaluation   HISTORY OF PRESENT ILLNESS: Raymond Walker. is a 67 y.o. male who presents to the clinic today for:   HPI    Retina Evaluation    In both eyes.  This started 1 month ago.  Associated Symptoms Negative for Blind Spot, Glare, Shoulder/Hip pain, Fatigue, Jaw Claudication, Photophobia, Floaters, Redness, Scalp Tenderness, Weight Loss, Distortion, Flashes, Pain, Trauma and Fever.  Context:  distance vision, mid-range vision and near vision.  Treatments tried include no treatments.  I, the attending physician,  performed the HPI with the patient and updated documentation appropriately.          Comments    Referral of Dr. Jorja Loa for retina evaluation. Patient states Dr, Jorja Loa seen" white spots" in his left eye. Patient denies decreased vision, wavy vision and ocular pain. Pt reports he has cataracts in right eye and is to have sx in [place] in one month. Denies Vit's/gtt's         Last edited by Bernarda Caffey, MD on 02/24/2018 12:20 PM. (History)    Pt states Dr. Jorja Loa told him he has a cataract OD; Pt denies DM dx, denies taking HTN med, pt states he does not check BP regularly;   Referring physician: Madelin Headings, DO [address] [place], [place] [address]  HISTORICAL INFORMATION:   Selected notes from the MEDICAL RECORD NUMBER Referred by Dr. 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 Cotter) [BCVA OD: CF OS: 20/50] Ocular Hx- cataract OU, PVD PMH- HTN     CURRENT MEDICATIONS: No current outpatient medications on file. (Ophthalmic Drugs)   No current facility-administered medications for this visit.  (Ophthalmic Drugs)   Current Outpatient Medications (Other)  Medication Sig  . acetaminophen (TYLENOL) 500 MG tablet Take 500 mg by mouth 2 (two) times daily.   Marland Kitchen amoxicillin (AMOXIL) 500 MG capsule Take 1 capsule (500 mg total) by  mouth 3 (three) times daily. (Patient not taking: Reported on 02/24/2018)  . ciprofloxacin (CIPRO) 500 MG tablet Take 1 tablet (500 mg total) by mouth every 12 (twelve) hours. (Patient not taking: Reported on 02/24/2018)  . HYDROcodone-acetaminophen (NORCO/VICODIN) 5-325 MG tablet Take 1-2 tablets by mouth every 4 (four) hours as needed. (Patient not taking: Reported on 02/24/2018)  . lisinopril (PRINIVIL,ZESTRIL) 10 MG tablet Take 1 tablet (10 mg total) by mouth daily. (Patient not taking: Reported on 02/24/2018)  . metroNIDAZOLE (FLAGYL) 500 MG tablet Take 1 tablet (500 mg total) by mouth 2 (two) times daily. (Patient not taking: Reported on 02/24/2018)   No current facility-administered medications for this visit.  (Other)      REVIEW OF SYSTEMS: ROS    Positive for: Eyes   Negative for: Constitutional, Gastrointestinal, Neurological, Skin, Genitourinary, Musculoskeletal, HENT, Endocrine, Cardiovascular, Respiratory, Psychiatric, Allergic/Imm, Heme/Lymph   Last edited by Zenovia Jordan, LPN on 2/29/7989  2:11 AM. (History)       ALLERGIES No Known Allergies  PAST MEDICAL HISTORY Past Medical History:  Diagnosis Date  . COPD (chronic obstructive pulmonary disease) ([place])   . Erectile dysfunction   . Impaired glucose tolerance   . Nephrolithiasis   . Pancreatitis, recurrent ([place])    Past Surgical History:  Procedure Laterality Date  . CHOLECYSTECTOMY    . ORCHIECTOMY    . VASECTOMY      FAMILY HISTORY Family History  Problem Relation Age of  Onset  . Hypertension Father   . Coronary artery disease Father   . Coronary artery disease Mother   . Cancer Mother        Breast    SOCIAL HISTORY Social History   Tobacco Use  . Smoking status: Current Every Day Smoker    Packs/day: 1.50    Types: Cigarettes  . Smokeless tobacco: Never Used  Substance Use Topics  . Alcohol use: No  . Drug use: No         OPHTHALMIC EXAM:  Base Eye Exam    Visual Acuity (Snellen -  Linear)      Right Left   Dist cc 20/200 -2 20/50 -1   Dist ph cc 20/60 +1 NI   Correction:  Glasses       Tonometry (Tonopen, 9:45 AM)      Right Left   Pressure 12 15       Pupils      Dark Light Shape React APD   Right 3 1.5 Round Brisk None   Left 3 1.5 Round Brisk None       Visual Fields (Counting fingers)      Left Right    Full Full       Extraocular Movement      Right Left    Full, Ortho Full, Ortho       Neuro/Psych    Oriented x3:  Yes   Mood/Affect:  Normal       Dilation    Both eyes:  1.0% Mydriacyl, 2.5% Phenylephrine @ 9:45 AM        Slit Lamp and Fundus Exam    External Exam      Right Left   External Brow ptosis Brow ptosis       Slit Lamp Exam      Right Left   Lids/Lashes Meibomian gland dysfunction, Telangiectasia Meibomian gland dysfunction, Telangiectasia   Conjunctiva/Sclera White and quiet White and quiet   Cornea 2+ Punctate epithelial erosions, Arcus, Superior limbus Neovascularization 2+ Punctate epithelial erosions, para central scar, Superior limbus Neovascularization   Anterior Chamber Deep and quiet Deep and quiet   Iris Round and dilated Round and dilated   Lens 2+ Nuclear sclerosis, 2-3+ Cortical cataract, 2+ Posterior subcapsular cataract 2+ Nuclear sclerosis, 2+ Cortical cataract, 1+ Posterior subcapsular cataract   Vitreous Vitreous syneresis Vitreous syneresis       Fundus Exam      Right Left   Disc Normal Normal   C/D Ratio 0.5 0.5   Macula Flat, Retinal pigment epithelial mottling, No heme or edema Superior-temporal edema, Exudates, IRH, telangiectatic vessels   Vessels Vascular attenuation, AV crossing changes Tortuous, AV crossing changes - greatest superiorly   Periphery Attached Attached        Refraction    Wearing Rx      Sphere Cylinder Axis   Right -5.75 +0.50 028   Left -2.25 +1.00 020   Age:  1.3 yr   Type:  SVL       Manifest Refraction      Sphere Cylinder Axis Dist VA   Right -8.75 +0.50  028 20/70   Left -2.25 +1.00 020 20/50          IMAGING AND PROCEDURES  Imaging and Procedures for 02/24/18  OCT, Retina - OU - Both Eyes     Right Eye Quality was poor. Central Foveal Thickness: 285. Progression has no prior data. Findings include normal foveal contour, no IRF, no  SRF, vitreomacular adhesion .   Left Eye Quality was good. Central Foveal Thickness: 360. Progression has no prior data. Findings include abnormal foveal contour, intraretinal fluid, no SRF, outer retinal atrophy.   Notes *Images captured and stored on drive  Diagnosis / Impression:  OD: NFP, No IRF/SRF, VMA OS: CME supratemporal macula  Clinical management:  See below  Abbreviations: NFP - Normal foveal profile. CME - cystoid macular edema. PED - pigment epithelial detachment. IRF - intraretinal fluid. SRF - subretinal fluid. EZ - ellipsoid zone. ERM - epiretinal membrane. ORA - outer retinal atrophy. ORT - outer retinal tubulation. SRHM - subretinal hyper-reflective material                  ASSESSMENT/PLAN:    ICD-10-CM   1. Branch retinal vein occlusion of left eye with macular edema H34.8320   2. Retinal edema H35.81 OCT, Retina - OU - Both Eyes  3. Combined forms of age-related cataract of both eyes H25.813     1,2. BRVO with CME OS -  - The natural history of retinal vein occlusion and macular edema and treatment options including observation, laser photocoagulation, and intravitreal antiVEGF injection with Avastin and Lucentis and Eylea and intravitreal injection of steroids with triamcinolone and Ozurdex and the complications of these procedures including loss of vision, infection, cataract, glaucoma, and retinal detachment were discussed with patient. - Specifically discussed findings from [place] / [place] study regarding patient stabilization with anti-VEGF agents and increased potential for visual improvements.  Also discussed need for frequent follow up and potentially multiple  injections given the chronic nature of the disease process - BCVA 20/50 - OCT shows CME superotemporal macula and fovea - recommend IVA OS #1 today, 03.11.19 - pt unable to stay for injection today and wishes to return for injection on Wednesday (03.13.19) - F/U Wednesday for injection OS only  3. Combined form age-related cataract OU-  - The symptoms of cataract, surgical options, and treatments and risks were discussed with patient. - discussed diagnosis and progression - visually significant OU - Dr. Jorja Loa has referred pt for cataract sx -- pt unsure of cataract surgeon's name   Ophthalmic Meds Ordered this visit:  No orders of the defined types were placed in this encounter.      Return in about 2 days (around 02/26/2018) for Injection OS only.  There are no Patient Instructions on file for this visit.   Explained the diagnoses, plan, and follow up with the patient and they expressed understanding.  Patient expressed understanding of the importance of proper follow up care.   This document serves as a record of services personally performed by Gardiner Sleeper, MD, PhD. It was created on their behalf by Catha Brow, [place], a certified ophthalmic assistant. The creation of this record is the provider's dictation and/or activities during the visit.  Electronically signed by: Catha Brow, [place]  02/24/18 12:24 PM   Gardiner Sleeper, M.D., Ph.D. Diseases & Surgery of the Retina and [place] 02/24/18   I have reviewed the above documentation for accuracy and completeness, and I agree with the above. Gardiner Sleeper, M.D., Ph.D. 02/24/18 12:24 PM     Abbreviations: M myopia (nearsighted); A astigmatism; H hyperopia (farsighted); P presbyopia; Mrx spectacle prescription;  CTL contact lenses; OD right eye; OS left eye; OU both eyes  XT exotropia; ET esotropia; PEK punctate epithelial keratitis; PEE punctate epithelial erosions; DES dry eye  syndrome; MGD meibomian gland  dysfunction; ATs artificial tears; PFAT's preservative free artificial tears; [place] nuclear sclerotic cataract; PSC posterior subcapsular cataract; ERM epi-retinal membrane; PVD posterior vitreous detachment; RD retinal detachment; DM diabetes mellitus; DR diabetic retinopathy; NPDR non-proliferative diabetic retinopathy; PDR proliferative diabetic retinopathy; CSME clinically significant macular edema; DME diabetic macular edema; dbh dot blot hemorrhages; CWS cotton wool spot; POAG primary open angle glaucoma; C/D cup-to-disc ratio; HVF humphrey visual field; GVF goldmann visual field; OCT optical coherence tomography; IOP intraocular pressure; BRVO Branch retinal vein occlusion; CRVO central retinal vein occlusion; CRAO central retinal artery occlusion; BRAO branch retinal artery occlusion; RT retinal tear; SB scleral buckle; PPV pars plana vitrectomy; VH Vitreous hemorrhage; PRP panretinal laser photocoagulation; IVK intravitreal kenalog; VMT vitreomacular traction; MH Macular hole;  NVD neovascularization of the disc; NVE neovascularization elsewhere; AREDS age related eye disease study; ARMD age related macular degeneration; POAG primary open angle glaucoma; EBMD epithelial/anterior basement membrane dystrophy; ACIOL anterior chamber intraocular lens; IOL intraocular lens; PCIOL posterior chamber intraocular lens; Phaco/IOL phacoemulsification with intraocular lens placement; [place] photorefractive keratectomy; LASIK laser assisted in situ keratomileusis; HTN hypertension; DM diabetes mellitus; COPD chronic obstructive pulmonary disease

## 2018-02-24 ENCOUNTER — Encounter (INDEPENDENT_AMBULATORY_CARE_PROVIDER_SITE_OTHER): Payer: Self-pay | Admitting: Ophthalmology

## 2018-02-24 ENCOUNTER — Ambulatory Visit (INDEPENDENT_AMBULATORY_CARE_PROVIDER_SITE_OTHER): Payer: Medicare Other | Admitting: Ophthalmology

## 2018-02-24 DIAGNOSIS — H25813 Combined forms of age-related cataract, bilateral: Secondary | ICD-10-CM | POA: Diagnosis not present

## 2018-02-24 DIAGNOSIS — H34832 Tributary (branch) retinal vein occlusion, left eye, with macular edema: Secondary | ICD-10-CM | POA: Diagnosis not present

## 2018-02-24 DIAGNOSIS — H3581 Retinal edema: Secondary | ICD-10-CM

## 2018-02-25 NOTE — Progress Notes (Signed)
[facility] Note  02/26/2018     CHIEF COMPLAINT Patient presents for Retina Follow Up   HISTORY OF PRESENT ILLNESS: Raymond Loflin. is a 67 y.o. male who presents to the clinic today for:   HPI    Retina Follow Up    Patient presents with  CRVO/BRVO.  In left eye.  Severity is moderate.  Duration of 2.  Since onset it is stable.  I, the attending physician,  performed the HPI with the patient and updated documentation appropriately.          Comments    Pt presents today for F/U for BRVO with CME OS, he will receive  Avastin injection OS, pt states everything is the same since Monday, he has not noticed any changes,        Last edited by Bernarda Caffey, MD on 02/26/2018 10:29 AM. (History)    Pt states Dr. Jorja Loa told him he has a cataract OD; Pt denies DM dx, denies taking HTN med, pt states he does not check BP regularly;   Referring physician: [name], MD [place], [place] [address]  HISTORICAL INFORMATION:   Selected notes from the MEDICAL RECORD NUMBER Referred by Dr. 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 Cotter) [BCVA OD: CF OS: 20/50] Ocular Hx- cataract OU, PVD PMH- HTN     CURRENT MEDICATIONS: No current outpatient medications on file. (Ophthalmic Drugs)   No current facility-administered medications for this visit.  (Ophthalmic Drugs)   Current Outpatient Medications (Other)  Medication Sig  . acetaminophen (TYLENOL) 500 MG tablet Take 500 mg by mouth 2 (two) times daily.   Marland Kitchen amoxicillin (AMOXIL) 500 MG capsule Take 1 capsule (500 mg total) by mouth 3 (three) times daily. (Patient not taking: Reported on 02/24/2018)  . ciprofloxacin (CIPRO) 500 MG tablet Take 1 tablet (500 mg total) by mouth every 12 (twelve) hours. (Patient not taking: Reported on 02/24/2018)  . HYDROcodone-acetaminophen (NORCO/VICODIN) 5-325 MG tablet Take 1-2 tablets by mouth every 4 (four) hours as needed.  (Patient not taking: Reported on 02/24/2018)  . lisinopril (PRINIVIL,ZESTRIL) 10 MG tablet Take 1 tablet (10 mg total) by mouth daily. (Patient not taking: Reported on 02/24/2018)  . metroNIDAZOLE (FLAGYL) 500 MG tablet Take 1 tablet (500 mg total) by mouth 2 (two) times daily. (Patient not taking: Reported on 02/24/2018)   Current Facility-Administered Medications (Other)  Medication Route  . Bevacizumab (AVASTIN) SOLN 1.25 mg Intravitreal      REVIEW OF SYSTEMS: ROS    Positive for: Cardiovascular, Eyes   Negative for: Constitutional, Gastrointestinal, Neurological, Skin, Genitourinary, Musculoskeletal, HENT, Endocrine, Respiratory, Psychiatric, Allergic/Imm, Heme/Lymph   Last edited by Debbrah Alar, COT on 02/26/2018  9:56 AM. (History)       ALLERGIES No Known Allergies  PAST MEDICAL HISTORY Past Medical History:  Diagnosis Date  . COPD (chronic obstructive pulmonary disease) ([place])   . Erectile dysfunction   . Impaired glucose tolerance   . Nephrolithiasis   . Pancreatitis, recurrent ([place])    Past Surgical History:  Procedure Laterality Date  . CHOLECYSTECTOMY    . ORCHIECTOMY    . VASECTOMY      FAMILY HISTORY Family History  Problem Relation Age of Onset  . Hypertension Father   . Coronary artery disease Father   . Coronary artery disease Mother   . Cancer Mother        Breast  SOCIAL HISTORY Social History   Tobacco Use  . Smoking status: Current Every Day Smoker    Packs/day: 1.50    Types: Cigarettes  . Smokeless tobacco: Never Used  Substance Use Topics  . Alcohol use: No  . Drug use: No         OPHTHALMIC EXAM:  Base Eye Exam    Visual Acuity (Snellen - Linear)      Right Left   Dist cc 20/150 +1 20/60 -2   Dist ph cc 20/60 -2 20/60 -1       Tonometry (Tonopen, 9:56 AM)      Right Left   Pressure 13 17       Pupils      Dark Light Shape React APD   Right 3 1.5 Round Brisk None   Left 3 1.5 Round Brisk None       Visual  Fields (Counting fingers)      Left Right    Full Full       Extraocular Movement      Right Left    Full, Ortho Full, Ortho       Neuro/Psych    Oriented x3:  Yes   Mood/Affect:  Normal       Dilation    Left eye:  2.5% Phenylephrine, 1.0% Mydriacyl @ 9:56 AM        Slit Lamp and Fundus Exam    External Exam      Right Left   External Brow ptosis Brow ptosis       Slit Lamp Exam      Right Left   Lids/Lashes Meibomian gland dysfunction, Telangiectasia Meibomian gland dysfunction, Telangiectasia   Conjunctiva/Sclera White and quiet White and quiet   Cornea 2+ Punctate epithelial erosions, Arcus, Superior limbus Neovascularization 2+ Punctate epithelial erosions, para central scar, Superior limbus Neovascularization   Anterior Chamber Deep and quiet Deep and quiet   Iris Round and dilated Round and dilated   Lens 2+ Nuclear sclerosis, 2-3+ Cortical cataract, 2+ Posterior subcapsular cataract 2+ Nuclear sclerosis, 2+ Cortical cataract, 1+ Posterior subcapsular cataract   Vitreous Vitreous syneresis Vitreous syneresis       Fundus Exam      Right Left   Disc Normal Normal   C/D Ratio 0.5 0.5   Macula Flat, Retinal pigment epithelial mottling, No heme or edema Superior-temporal edema, Exudates, IRH, telangiectatic vessels   Vessels Vascular attenuation, AV crossing changes Tortuous, AV crossing changes - greatest superiorly   Periphery Attached Attached          IMAGING AND PROCEDURES  Imaging and Procedures for 02/26/18  Intravitreal Injection, Pharmacologic Agent - OS - Left Eye     Time Out 02/26/2018. 10:29 AM. Confirmed correct patient, procedure, site, and patient consented.   Anesthesia Topical anesthesia was used. Anesthetic medications included Tetracaine 0.5%, Lidocaine 2%.   Procedure Preparation included 5% betadine to ocular surface, eyelid speculum. A supplied needle was used.   Injection: 1.25 mg Bevacizumab 1.25mg /0.88ml   NDC: 03500-938-18     Lot: [id number]     Expiration Date: 08/30/2018   Route: Intravitreal   Site: Left Eye   Waste: 0 mg  Post-op Post injection exam found visual acuity of at least counting fingers. The patient tolerated the procedure well. There were no complications. The patient received written and verbal post procedure care education.                 ASSESSMENT/PLAN:    ICD-10-CM  1. Branch retinal vein occlusion of left eye with macular edema H34.8320 Intravitreal Injection, Pharmacologic Agent - OS - Left Eye    Bevacizumab (AVASTIN) SOLN 1.25 mg  2. Retinal edema H35.81   3. Combined forms of age-related cataract of both eyes H25.813     1,2. BRVO with CME OS -  - The natural history of retinal vein occlusion and macular edema and treatment options including observation, laser photocoagulation, and intravitreal antiVEGF injection with Avastin and Lucentis and Eylea and intravitreal injection of steroids with triamcinolone and Ozurdex and the complications of these procedures including loss of vision, infection, cataract, glaucoma, and retinal detachment were discussed with patient. - Specifically discussed findings from [place] / [place] study regarding patient stabilization with anti-VEGF agents and increased potential for visual improvements.  Also discussed need for frequent follow up and potentially multiple injections given the chronic nature of the disease process - BCVA 20/50 - OCT shows CME superotemporal macula and fovea - pt unable to stay for injection Monday -- here today for injection only OS - recommend IVA OS #1 today, 03.13.19 - RBA of procedure discussed, questions answered - informed consent obtained and signed - see procedure note - F/U 4 wks  3. Combined form age-related cataract OU-  - The symptoms of cataract, surgical options, and treatments and risks were discussed with patient. - discussed diagnosis and progression - visually significant OU - Dr. Jorja Loa has  referred pt for cataract sx -- pt unsure of cataract surgeon's name   Ophthalmic Meds Ordered this visit:  Meds ordered this encounter  Medications  . Bevacizumab (AVASTIN) SOLN 1.25 mg       Return in about 4 weeks (around 03/26/2018) for Dilated Exam, OCT, Possible Injxn OS.  There are no Patient Instructions on file for this visit.   Explained the diagnoses, plan, and follow up with the patient and they expressed understanding.  Patient expressed understanding of the importance of proper follow up care.   This document serves as a record of services personally performed by Gardiner Sleeper, MD, PhD. It was created on their behalf by Catha Brow, [place], a certified ophthalmic assistant. The creation of this record is the provider's dictation and/or activities during the visit.  Electronically signed by: Catha Brow, [place]  02/26/18 10:31 AM   Gardiner Sleeper, M.D., Ph.D. Diseases & Surgery of the Retina and [place] 02/26/18    Abbreviations: M myopia (nearsighted); A astigmatism; H hyperopia (farsighted); P presbyopia; Mrx spectacle prescription;  CTL contact lenses; OD right eye; OS left eye; OU both eyes  XT exotropia; ET esotropia; PEK punctate epithelial keratitis; PEE punctate epithelial erosions; DES dry eye syndrome; MGD meibomian gland dysfunction; ATs artificial tears; PFAT's preservative free artificial tears; [place] nuclear sclerotic cataract; PSC posterior subcapsular cataract; ERM epi-retinal membrane; PVD posterior vitreous detachment; RD retinal detachment; DM diabetes mellitus; DR diabetic retinopathy; NPDR non-proliferative diabetic retinopathy; PDR proliferative diabetic retinopathy; CSME clinically significant macular edema; DME diabetic macular edema; dbh dot blot hemorrhages; CWS cotton wool spot; POAG primary open angle glaucoma; C/D cup-to-disc ratio; HVF humphrey visual field; GVF goldmann visual field; OCT optical coherence  tomography; IOP intraocular pressure; BRVO Branch retinal vein occlusion; CRVO central retinal vein occlusion; CRAO central retinal artery occlusion; BRAO branch retinal artery occlusion; RT retinal tear; SB scleral buckle; PPV pars plana vitrectomy; VH Vitreous hemorrhage; PRP panretinal laser photocoagulation; IVK intravitreal kenalog; VMT vitreomacular traction; MH Macular hole;  NVD neovascularization of the  disc; NVE neovascularization elsewhere; AREDS age related eye disease study; ARMD age related macular degeneration; POAG primary open angle glaucoma; EBMD epithelial/anterior basement membrane dystrophy; ACIOL anterior chamber intraocular lens; IOL intraocular lens; PCIOL posterior chamber intraocular lens; Phaco/IOL phacoemulsification with intraocular lens placement; [place] photorefractive keratectomy; LASIK laser assisted in situ keratomileusis; HTN hypertension; DM diabetes mellitus; COPD chronic obstructive pulmonary disease

## 2018-02-26 ENCOUNTER — Ambulatory Visit (INDEPENDENT_AMBULATORY_CARE_PROVIDER_SITE_OTHER): Payer: Medicare Other | Admitting: Ophthalmology

## 2018-02-26 ENCOUNTER — Encounter (INDEPENDENT_AMBULATORY_CARE_PROVIDER_SITE_OTHER): Payer: Self-pay | Admitting: Ophthalmology

## 2018-02-26 DIAGNOSIS — H34832 Tributary (branch) retinal vein occlusion, left eye, with macular edema: Secondary | ICD-10-CM

## 2018-02-26 DIAGNOSIS — H25813 Combined forms of age-related cataract, bilateral: Secondary | ICD-10-CM

## 2018-02-26 DIAGNOSIS — H3581 Retinal edema: Secondary | ICD-10-CM

## 2018-02-26 MED ORDER — BEVACIZUMAB CHEMO INJECTION 1.25MG/0.05ML SYRINGE FOR KALEIDOSCOPE
1.2500 mg | INTRAVITREAL | Status: DC
  Administered 2018-02-26: 1.25 mg via INTRAVITREAL

## 2018-03-27 NOTE — Progress Notes (Signed)
[facility] Note  03/28/2018     CHIEF COMPLAINT Patient presents for Retina Follow Up   HISTORY OF PRESENT ILLNESS: Raymond Walker. is a 67 y.o. male who presents to the clinic today for:   HPI    Retina Follow Up    Patient presents with  CRVO/BRVO.  In left eye.  Severity is mild.  Since onset it is gradually improving.  I, the attending physician,  performed the HPI with the patient and updated documentation appropriately.          Comments    F/U BRVO w/CME OS. Patient states "his vision is becoming clearer day by day left eye OS'. Pt is ready for Avastin today Os if needed.        Last edited by Bernarda Caffey, MD on 03/28/2018  9:59 AM. (History)    Pt states he feels last injection was helpful;    Referring physician: [name], MD [place], [place] [address]  HISTORICAL INFORMATION:   Selected notes from the MEDICAL RECORD NUMBER Referred by Dr. 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 Cotter) [BCVA OD: CF OS: 20/50] Ocular Hx- cataract OU, PVD PMH- HTN     CURRENT MEDICATIONS: No current outpatient medications on file. (Ophthalmic Drugs)   No current facility-administered medications for this visit.  (Ophthalmic Drugs)   Current Outpatient Medications (Other)  Medication Sig  . acetaminophen (TYLENOL) 500 MG tablet Take 500 mg by mouth 2 (two) times daily.   Marland Kitchen amoxicillin (AMOXIL) 500 MG capsule Take 1 capsule (500 mg total) by mouth 3 (three) times daily.  . ciprofloxacin (CIPRO) 500 MG tablet Take 1 tablet (500 mg total) by mouth every 12 (twelve) hours.  Marland Kitchen HYDROcodone-acetaminophen (NORCO/VICODIN) 5-325 MG tablet Take 1-2 tablets by mouth every 4 (four) hours as needed.  Marland Kitchen lisinopril (PRINIVIL,ZESTRIL) 10 MG tablet Take 1 tablet (10 mg total) by mouth daily.  . metroNIDAZOLE (FLAGYL) 500 MG tablet Take 1 tablet (500 mg total) by mouth 2 (two) times daily.   Current  Facility-Administered Medications (Other)  Medication Route  . Bevacizumab (AVASTIN) SOLN 1.25 mg Intravitreal  . Bevacizumab (AVASTIN) SOLN 1.25 mg Intravitreal      REVIEW OF SYSTEMS: ROS    Positive for: Eyes   Negative for: Constitutional, Gastrointestinal, Neurological, Skin, Genitourinary, Musculoskeletal, HENT, Endocrine, Cardiovascular, Respiratory, Psychiatric, Allergic/Imm, Heme/Lymph   Last edited by Zenovia Jordan, LPN on 03/30/2439  1:02 AM. (History)       ALLERGIES No Known Allergies  PAST MEDICAL HISTORY Past Medical History:  Diagnosis Date  . COPD (chronic obstructive pulmonary disease) ([place])   . Erectile dysfunction   . Impaired glucose tolerance   . Nephrolithiasis   . Pancreatitis, recurrent    Past Surgical History:  Procedure Laterality Date  . CHOLECYSTECTOMY    . ORCHIECTOMY    . VASECTOMY      FAMILY HISTORY Family History  Problem Relation Age of Onset  . Hypertension Father   . Coronary artery disease Father   . Coronary artery disease Mother   . Cancer Mother        Breast    SOCIAL HISTORY Social History   Tobacco Use  . Smoking status: Current Every Day Smoker    Packs/day: 1.50    Types: Cigarettes  . Smokeless tobacco: Never Used  Substance Use Topics  . Alcohol use: No  . Drug use: No  OPHTHALMIC EXAM:  Base Eye Exam    Visual Acuity (Snellen - Linear)      Right Left   Dist cc 20/300 -1 20/50   Dist ph cc 20/60 20/40 -2   Correction:  Glasses       Tonometry (Tonopen, 8:56 AM)      Right Left   Pressure 12 13       Pupils      Dark Light Shape React APD   Right 2 1.5 Round Sluggish None   Left 2 1.5 Round Sluggish None       Visual Fields (Counting fingers)      Left Right    Full Full       Extraocular Movement      Right Left    Full, Ortho Full, Ortho       Neuro/Psych    Oriented x3:  Yes   Mood/Affect:  Normal       Dilation    Both eyes:  1.0% Mydriacyl, 2.5%  Phenylephrine @ 8:56 AM        Slit Lamp and Fundus Exam    External Exam      Right Left   External Brow ptosis Brow ptosis       Slit Lamp Exam      Right Left   Lids/Lashes Meibomian gland dysfunction, Telangiectasia Meibomian gland dysfunction, Telangiectasia   Conjunctiva/Sclera White and quiet White and quiet   Cornea 2+ Punctate epithelial erosions, Arcus, Superior limbus Neovascularization 2+ Punctate epithelial erosions, para central scar, Superior limbus Neovascularization   Anterior Chamber Deep and quiet Deep and quiet   Iris Round and dilated Round and dilated   Lens 2+ Nuclear sclerosis, 2-3+ Cortical cataract, 2+ Posterior subcapsular cataract 2+ Nuclear sclerosis, 2+ Cortical cataract, 1+ Posterior subcapsular cataract   Vitreous Vitreous syneresis Vitreous syneresis       Fundus Exam      Right Left   Disc Normal Normal   C/D Ratio 0.5 0.5   Macula Flat, Retinal pigment epithelial mottling, No heme or edema Superior-temporal edema - improved, Exudates superior macula, IRH, telangiectatic vessels   Vessels Vascular attenuation, AV crossing changes Tortuous, AV crossing changes - greatest superiorly   Periphery Attached Attached          IMAGING AND PROCEDURES  Imaging and Procedures for 03/31/18  OCT, Retina - OU - Both Eyes       Right Eye Quality was borderline. Central Foveal Thickness: 277. Progression has been stable. Findings include normal foveal contour, no IRF, no SRF.   Left Eye Quality was good. Central Foveal Thickness: 306. Progression has improved. Findings include abnormal foveal contour, intraretinal fluid, no SRF, outer retinal atrophy, intraretinal hyper-reflective material.   Notes *Images captured and stored on drive  Diagnosis / Impression:  OD: NFP, No IRF/SRF OS: CME supratemporal macula with interval improvement from prior  Clinical management:  See below  Abbreviations: NFP - Normal foveal profile. CME - cystoid macular  edema. PED - pigment epithelial detachment. IRF - intraretinal fluid. SRF - subretinal fluid. EZ - ellipsoid zone. ERM - epiretinal membrane. ORA - outer retinal atrophy. ORT - outer retinal tubulation. SRHM - subretinal hyper-reflective material         Intravitreal Injection, Pharmacologic Agent - OS - Left Eye       Time Out 03/28/2018. 11:12 AM. Confirmed correct patient, procedure, site, and patient consented.   Anesthesia Topical anesthesia was used. Anesthetic medications included Tetracaine 0.5%, Lidocaine 2%.  Procedure Preparation included 5% betadine to ocular surface, eyelid speculum. A supplied needle was used.   Injection: 1.25 mg Bevacizumab 1.25mg /0.88ml   NDC: 48546-270-35    Lot: [id number]     Expiration Date: 05/31/2018   Route: Intravitreal   Site: Left Eye   Waste: 0 mg  Post-op Post injection exam found visual acuity of at least counting fingers. The patient tolerated the procedure well. There were no complications. The patient received written and verbal post procedure care education.                 ASSESSMENT/PLAN:    ICD-10-CM   1. Branch retinal vein occlusion of left eye with macular edema H34.8320 Intravitreal Injection, Pharmacologic Agent - OS - Left Eye    Bevacizumab (AVASTIN) SOLN 1.25 mg  2. Retinal edema H35.81 OCT, Retina - OU - Both Eyes  3. Combined forms of age-related cataract of both eyes H25.813     1,2. BRVO with CME OS -  - The natural history of retinal vein occlusion and macular edema and treatment options including observation, laser photocoagulation, and intravitreal antiVEGF injection with Avastin and Lucentis and Eylea and intravitreal injection of steroids with triamcinolone and Ozurdex and the complications of these procedures including loss of vision, infection, cataract, glaucoma, and retinal detachment were discussed with patient. - Specifically discussed findings from [place] / [place] study regarding patient  stabilization with anti-VEGF agents and increased potential for visual improvements.  Also discussed need for frequent follow up and potentially multiple injections given the chronic nature of the disease process - S/P IVA OS #1 (03.13.19) - BCVA 20/50 -- improved - OCT shows improved but persistent CME superotemporal macula and fovea - recommend IVA OS #2 today, 04.12.19 - RBA of procedure discussed, questions answered - informed consent obtained and signed - see procedure note - F/U 4 wks  3. Combined form age-related cataract OU-  - The symptoms of cataract, surgical options, and treatments and risks were discussed with patient. - discussed diagnosis and progression - visually significant OU - Dr. 17.12.19 has referred pt for cataract sx -- pt unsure of cataract surgeon's name and missed consult visit - pt to call Dr. Jorja Loa to reschedule consult   Ophthalmic Meds Ordered this visit:  Meds ordered this encounter  Medications  . Bevacizumab (AVASTIN) SOLN 1.25 mg       Return in about 1 month (around 04/25/2018) for F/U BRVO w/ CME OS.  There are no Patient Instructions on file for this visit.   Explained the diagnoses, plan, and follow up with the patient and they expressed understanding.  Patient expressed understanding of the importance of proper follow up care.   This document serves as a record of services personally performed by 18/09/2018, MD, PhD. It was created on their behalf by Gardiner Sleeper, [place], a certified ophthalmic assistant. The creation of this record is the provider's dictation and/or activities during the visit.  Electronically signed by: [address], [place]  03/31/18 12:39 PM  04/13/18, M.D., Ph.D. Diseases & Surgery of the Retina and [place] 03/31/18  I have reviewed the above documentation for accuracy and completeness, and I agree with the above. 04/13/18, M.D., Ph.D. 03/31/18 12:39  PM    Abbreviations: M myopia (nearsighted); A astigmatism; H hyperopia (farsighted); P presbyopia; Mrx spectacle prescription;  CTL contact lenses; OD right eye; OS left eye; OU both eyes  XT exotropia; ET esotropia; PEK punctate  epithelial keratitis; PEE punctate epithelial erosions; DES dry eye syndrome; MGD meibomian gland dysfunction; ATs artificial tears; PFAT's preservative free artificial tears; [place] nuclear sclerotic cataract; PSC posterior subcapsular cataract; ERM epi-retinal membrane; PVD posterior vitreous detachment; RD retinal detachment; DM diabetes mellitus; DR diabetic retinopathy; NPDR non-proliferative diabetic retinopathy; PDR proliferative diabetic retinopathy; CSME clinically significant macular edema; DME diabetic macular edema; dbh dot blot hemorrhages; CWS cotton wool spot; POAG primary open angle glaucoma; C/D cup-to-disc ratio; HVF humphrey visual field; GVF goldmann visual field; OCT optical coherence tomography; IOP intraocular pressure; BRVO Branch retinal vein occlusion; CRVO central retinal vein occlusion; CRAO central retinal artery occlusion; BRAO branch retinal artery occlusion; RT retinal tear; SB scleral buckle; PPV pars plana vitrectomy; VH Vitreous hemorrhage; PRP panretinal laser photocoagulation; IVK intravitreal kenalog; VMT vitreomacular traction; MH Macular hole;  NVD neovascularization of the disc; NVE neovascularization elsewhere; AREDS age related eye disease study; ARMD age related macular degeneration; POAG primary open angle glaucoma; EBMD epithelial/anterior basement membrane dystrophy; ACIOL anterior chamber intraocular lens; IOL intraocular lens; PCIOL posterior chamber intraocular lens; Phaco/IOL phacoemulsification with intraocular lens placement; [place] photorefractive keratectomy; LASIK laser assisted in situ keratomileusis; HTN hypertension; DM diabetes mellitus; COPD chronic obstructive pulmonary disease

## 2018-03-28 ENCOUNTER — Ambulatory Visit (INDEPENDENT_AMBULATORY_CARE_PROVIDER_SITE_OTHER): Payer: Medicare Other | Admitting: Ophthalmology

## 2018-03-28 ENCOUNTER — Encounter (INDEPENDENT_AMBULATORY_CARE_PROVIDER_SITE_OTHER): Payer: Self-pay | Admitting: Ophthalmology

## 2018-03-28 DIAGNOSIS — H34832 Tributary (branch) retinal vein occlusion, left eye, with macular edema: Secondary | ICD-10-CM

## 2018-03-28 DIAGNOSIS — H3581 Retinal edema: Secondary | ICD-10-CM | POA: Diagnosis not present

## 2018-03-28 DIAGNOSIS — H25813 Combined forms of age-related cataract, bilateral: Secondary | ICD-10-CM

## 2018-03-31 ENCOUNTER — Encounter (INDEPENDENT_AMBULATORY_CARE_PROVIDER_SITE_OTHER): Payer: Self-pay | Admitting: Ophthalmology

## 2018-03-31 DIAGNOSIS — H3581 Retinal edema: Secondary | ICD-10-CM | POA: Diagnosis not present

## 2018-03-31 DIAGNOSIS — H34832 Tributary (branch) retinal vein occlusion, left eye, with macular edema: Secondary | ICD-10-CM | POA: Diagnosis not present

## 2018-03-31 MED ORDER — BEVACIZUMAB CHEMO INJECTION 1.25MG/0.05ML SYRINGE FOR KALEIDOSCOPE
1.2500 mg | INTRAVITREAL | Status: DC
  Administered 2018-03-31: 1.25 mg via INTRAVITREAL

## 2018-04-10 DIAGNOSIS — H25813 Combined forms of age-related cataract, bilateral: Secondary | ICD-10-CM | POA: Diagnosis not present

## 2018-04-24 NOTE — Patient Instructions (Signed)
Your procedure is scheduled on: 05/02/2018   Report to [facility] at   645  AM.  Call this number if you have problems the morning of surgery: [phone]   Do not eat food or drink liquids :After Midnight.      Take these medicines the morning of surgery with A SIP OF WATER: None   Do not wear jewelry, make-up or nail polish.  Do not wear lotions, powders, or perfumes. You may wear deodorant.  Do not shave 48 hours prior to surgery.  Do not bring valuables to the hospital.  Contacts, dentures or bridgework may not be worn into surgery.  Leave suitcase in the car. After surgery it may be brought to your room.  For patients admitted to the hospital, checkout time is 11:00 AM the day of discharge.   Patients discharged the day of surgery will not be allowed to drive home.  :     Please read over the following fact sheets that you were given: Coughing and Deep Breathing, Surgical Site Infection Prevention, Anesthesia Post-op Instructions and Care and Recovery After Surgery    Cataract A cataract is a clouding of the lens of the eye. When a lens becomes cloudy, vision is reduced based on the degree and nature of the clouding. Many cataracts reduce vision to some degree. Some cataracts make people more near-sighted as they develop. Other cataracts increase glare. Cataracts that are ignored and become worse can sometimes look white. The white color can be seen through the pupil. CAUSES   Aging. However, cataracts may occur at any age, even in newborns.   Certain drugs.   Trauma to the eye.   Certain diseases such as diabetes.   Specific eye diseases such as chronic inflammation inside the eye or a sudden attack of a rare form of glaucoma.   Inherited or acquired medical problems.  SYMPTOMS   Gradual, progressive drop in vision in the affected eye.   Severe, rapid visual loss. This most often happens when trauma is the cause.  DIAGNOSIS  To detect a cataract, an eye doctor examines  the lens. Cataracts are best diagnosed with an exam of the eyes with the pupils enlarged (dilated) by drops.  TREATMENT  For an early cataract, vision may improve by using different eyeglasses or stronger lighting. If that does not help your vision, surgery is the only effective treatment. A cataract needs to be surgically removed when vision loss interferes with your everyday activities, such as driving, reading, or watching TV. A cataract may also have to be removed if it prevents examination or treatment of another eye problem. Surgery removes the cloudy lens and usually replaces it with a substitute lens (intraocular lens, IOL).  At a time when both you and your doctor agree, the cataract will be surgically removed. If you have cataracts in both eyes, only one is usually removed at a time. This allows the operated eye to heal and be out of danger from any possible problems after surgery (such as infection or poor wound healing). In rare cases, a cataract may be doing damage to your eye. In these cases, your caregiver may advise surgical removal right away. The vast majority of people who have cataract surgery have better vision afterward. HOME CARE INSTRUCTIONS  If you are not planning surgery, you may be asked to do the following:  Use different eyeglasses.   Use stronger or brighter lighting.   Ask your eye doctor about reducing your medicine  dose or changing medicines if it is thought that a medicine caused your cataract. Changing medicines does not make the cataract go away on its own.   Become familiar with your surroundings. Poor vision can lead to injury. Avoid bumping into things on the affected side. You are at a higher risk for tripping or falling.   Exercise extreme care when driving or operating machinery.   Wear sunglasses if you are sensitive to bright light or experiencing problems with glare.  SEEK IMMEDIATE MEDICAL CARE IF:   You have a worsening or sudden vision loss.    You notice redness, swelling, or increasing pain in the eye.   You have a fever.  Document Released: 12/03/2005 Document Revised: 11/22/2011 Document Reviewed: 07/27/2011 [facility] Patient Information 2012 [place].PATIENT INSTRUCTIONS POST-ANESTHESIA  IMMEDIATELY FOLLOWING SURGERY:  Do not drive or operate machinery for the first twenty four hours after surgery.  Do not make any important decisions for twenty four hours after surgery or while taking narcotic pain medications or sedatives.  If you develop intractable nausea and vomiting or a severe headache please notify your doctor immediately.  FOLLOW-UP:  Please make an appointment with your surgeon as instructed. You do not need to follow up with anesthesia unless specifically instructed to do so.  WOUND CARE INSTRUCTIONS (if applicable):  Keep a dry clean dressing on the anesthesia/puncture wound site if there is drainage.  Once the wound has quit draining you may leave it open to air.  Generally you should leave the bandage intact for twenty four hours unless there is drainage.  If the epidural site drains for more than 36-48 hours please call the anesthesia department.  QUESTIONS?:  Please feel free to call your physician or the hospital operator if you have any questions, and they will be happy to assist you.

## 2018-04-25 DIAGNOSIS — H25811 Combined forms of age-related cataract, right eye: Secondary | ICD-10-CM | POA: Diagnosis not present

## 2018-04-28 ENCOUNTER — Encounter (INDEPENDENT_AMBULATORY_CARE_PROVIDER_SITE_OTHER): Payer: Medicare Other | Admitting: Ophthalmology

## 2018-04-29 ENCOUNTER — Encounter (HOSPITAL_COMMUNITY)
Admission: RE | Admit: 2018-04-29 | Discharge: 2018-04-29 | Disposition: A | Discharge status: Home / Self Care | Payer: Medicare Other | Source: Ambulatory Visit | Attending: Ophthalmology | Admitting: Ophthalmology

## 2018-04-29 ENCOUNTER — Encounter (HOSPITAL_COMMUNITY): Payer: Self-pay

## 2018-04-29 ENCOUNTER — Other Ambulatory Visit: Payer: Self-pay

## 2018-04-29 DIAGNOSIS — I2510 Atherosclerotic heart disease of native coronary artery without angina pectoris: Secondary | ICD-10-CM | POA: Diagnosis not present

## 2018-04-29 DIAGNOSIS — I739 Peripheral vascular disease, unspecified: Secondary | ICD-10-CM | POA: Diagnosis not present

## 2018-04-29 DIAGNOSIS — F17200 Nicotine dependence, unspecified, uncomplicated: Secondary | ICD-10-CM | POA: Diagnosis not present

## 2018-04-29 DIAGNOSIS — H269 Unspecified cataract: Secondary | ICD-10-CM | POA: Diagnosis not present

## 2018-04-29 DIAGNOSIS — M1990 Unspecified osteoarthritis, unspecified site: Secondary | ICD-10-CM | POA: Diagnosis not present

## 2018-04-29 LAB — CBC
HCT: 47.4 % (ref 39.0–52.0)
Hemoglobin: 15.4 g/dL (ref 13.0–17.0)
MCH: 29.1 pg (ref 26.0–34.0)
MCHC: 32.5 g/dL (ref 30.0–36.0)
MCV: 89.6 fL (ref 78.0–100.0)
Platelets: 132 10*3/uL — ABNORMAL LOW (ref 150–400)
RBC: 5.29 MIL/uL (ref 4.22–5.81)
RDW: 13.5 % (ref 11.5–15.5)
WBC: 6.1 10*3/uL (ref 4.0–10.5)

## 2018-04-29 LAB — BASIC METABOLIC PANEL
Anion gap: 9 (ref 5–15)
BUN: 11 mg/dL (ref 6–20)
CO2: 30 mmol/L (ref 22–32)
Calcium: 9.4 mg/dL (ref 8.9–10.3)
Chloride: 101 mmol/L (ref 101–111)
Creatinine, Ser: 0.81 mg/dL (ref 0.61–1.24)
GFR calc Af Amer: >60 mL/min (ref >60)
GFR calc non Af Amer: >60 mL/min (ref >60)
Glucose, Bld: 110 mg/dL — ABNORMAL HIGH (ref 65–99)
Potassium: 3.8 mmol/L (ref 3.5–5.1)
Sodium: 140 mmol/L (ref 135–145)

## 2018-05-02 ENCOUNTER — Encounter (HOSPITAL_COMMUNITY)
Admission: RE | Disposition: A | Discharge status: Home / Self Care | Payer: Self-pay | Source: Ambulatory Visit | Attending: Ophthalmology

## 2018-05-02 ENCOUNTER — Encounter (HOSPITAL_COMMUNITY): Payer: Self-pay | Admitting: *Deleted

## 2018-05-02 ENCOUNTER — Ambulatory Visit (HOSPITAL_COMMUNITY)
Admission: RE | Admit: 2018-05-02 | Discharge: 2018-05-02 | Disposition: A | Discharge status: Home / Self Care | Payer: Medicare Other | Source: Ambulatory Visit | Attending: Ophthalmology | Admitting: Ophthalmology

## 2018-05-02 ENCOUNTER — Ambulatory Visit (HOSPITAL_COMMUNITY): Payer: Medicare Other | Admitting: Anesthesiology

## 2018-05-02 DIAGNOSIS — I2510 Atherosclerotic heart disease of native coronary artery without angina pectoris: Secondary | ICD-10-CM | POA: Diagnosis not present

## 2018-05-02 DIAGNOSIS — M1990 Unspecified osteoarthritis, unspecified site: Secondary | ICD-10-CM | POA: Diagnosis not present

## 2018-05-02 DIAGNOSIS — H269 Unspecified cataract: Secondary | ICD-10-CM | POA: Insufficient documentation

## 2018-05-02 DIAGNOSIS — I739 Peripheral vascular disease, unspecified: Secondary | ICD-10-CM | POA: Insufficient documentation

## 2018-05-02 DIAGNOSIS — H25811 Combined forms of age-related cataract, right eye: Secondary | ICD-10-CM | POA: Diagnosis not present

## 2018-05-02 DIAGNOSIS — F17200 Nicotine dependence, unspecified, uncomplicated: Secondary | ICD-10-CM | POA: Insufficient documentation

## 2018-05-02 DIAGNOSIS — H2511 Age-related nuclear cataract, right eye: Secondary | ICD-10-CM | POA: Diagnosis not present

## 2018-05-02 HISTORY — PX: CATARACT EXTRACTION W/PHACO: SHX586

## 2018-05-02 SURGERY — PHACOEMULSIFICATION, CATARACT, WITH IOL INSERTION
Anesthesia: Monitor Anesthesia Care | Site: Eye | Laterality: Right

## 2018-05-02 MED ORDER — NEOMYCIN-POLYMYXIN-DEXAMETH 3.5-10000-0.1 OP SUSP
OPHTHALMIC | Status: DC | PRN
  Administered 2018-05-02: 2 [drp] via OPHTHALMIC

## 2018-05-02 MED ORDER — BSS IO SOLN
INTRAOCULAR | Status: DC | PRN
Start: 2018-05-02 — End: 2018-05-02
  Administered 2018-05-02: 15 mL

## 2018-05-02 MED ORDER — TETRACAINE HCL 0.5 % OP SOLN
1.0000 [drp] | OPHTHALMIC | Status: AC
  Administered 2018-05-02 (×3): 1 [drp] via OPHTHALMIC

## 2018-05-02 MED ORDER — PHENYLEPHRINE HCL 2.5 % OP SOLN
1.0000 [drp] | OPHTHALMIC | Status: AC
  Administered 2018-05-02 (×3): 1 [drp] via OPHTHALMIC

## 2018-05-02 MED ORDER — LIDOCAINE HCL (PF) 1 % IJ SOLN
INTRAOCULAR | Status: DC | PRN
  Administered 2018-05-02: 1 mL via OPHTHALMIC

## 2018-05-02 MED ORDER — SODIUM HYALURONATE 23 MG/ML IO SOLN
INTRAOCULAR | Status: DC | PRN
  Administered 2018-05-02: 0.6 mL via INTRAOCULAR

## 2018-05-02 MED ORDER — PROVISC 10 MG/ML IO SOLN
INTRAOCULAR | Status: DC | PRN
  Administered 2018-05-02: 0.85 mL via INTRAOCULAR

## 2018-05-02 MED ORDER — EPINEPHRINE PF 1 MG/ML IJ SOLN
INTRAOCULAR | Status: DC | PRN
  Administered 2018-05-02: 500 mL

## 2018-05-02 MED ORDER — MIDAZOLAM HCL 2 MG/2ML IJ SOLN
INTRAMUSCULAR | Status: AC
  Filled 2018-05-02: qty 2

## 2018-05-02 MED ORDER — LACTATED RINGERS IV SOLN
INTRAVENOUS | Status: DC
  Administered 2018-05-02: 07:00:00 via INTRAVENOUS

## 2018-05-02 MED ORDER — LIDOCAINE HCL 3.5 % OP GEL
1.0000 "application " | Freq: Once | OPHTHALMIC | Status: AC
  Administered 2018-05-02: 1 via OPHTHALMIC

## 2018-05-02 MED ORDER — CYCLOPENTOLATE-PHENYLEPHRINE 0.2-1 % OP SOLN
1.0000 [drp] | OPHTHALMIC | Status: AC
  Administered 2018-05-02 (×3): 1 [drp] via OPHTHALMIC

## 2018-05-02 MED ORDER — POVIDONE-IODINE 5 % OP SOLN
OPHTHALMIC | Status: DC | PRN
  Administered 2018-05-02: 1 via OPHTHALMIC

## 2018-05-02 MED ORDER — MIDAZOLAM HCL 5 MG/5ML IJ SOLN
INTRAMUSCULAR | Status: DC | PRN
  Administered 2018-05-02 (×2): 1 mg via INTRAVENOUS

## 2018-05-02 SURGICAL SUPPLY — 12 items
CLOTH BEACON ORANGE TIMEOUT ST (SAFETY) ×2 IMPLANT
EYE SHIELD UNIVERSAL CLEAR (GAUZE/BANDAGES/DRESSINGS) ×2 IMPLANT
GLOVE BIOGEL PI IND STRL 6.5 (GLOVE) ×2 IMPLANT
GLOVE BIOGEL PI INDICATOR 6.5 (GLOVE) ×2
LENS ALC ACRYL/TECN (Ophthalmic Related) ×2 IMPLANT
NEEDLE HYPO 18GX1.5 BLUNT FILL (NEEDLE) ×2 IMPLANT
PAD ARMBOARD 7.5X6 YLW CONV (MISCELLANEOUS) ×2 IMPLANT
SYR TB 1ML LL NO SAFETY (SYRINGE) ×2 IMPLANT
TAPE SURG TRANSPORE 1 IN (GAUZE/BANDAGES/DRESSINGS) ×1 IMPLANT
TAPE SURGICAL TRANSPORE 1 IN (GAUZE/BANDAGES/DRESSINGS) ×1
VISCOELASTIC ADDITIONAL (OPHTHALMIC RELATED) ×2 IMPLANT
WATER STERILE IRR 250ML POUR (IV SOLUTION) ×2 IMPLANT

## 2018-05-02 NOTE — H&P (Signed)
The H and P was reviewed and updated. The patient was examined.  No changes were found after exam.  The surgical eye was marked.  

## 2018-05-02 NOTE — Transfer of Care (Signed)
Immediate Anesthesia Transfer of Care Note  Patient: Raymond Walker.  Procedure(s) Performed: CATARACT EXTRACTION PHACO AND INTRAOCULAR LENS PLACEMENT (IOC) (Right Eye)  Patient Location: PACU  Anesthesia Type:MAC  Level of Consciousness: awake  Airway & Oxygen Therapy: Patient Spontanous Breathing  Post-op Assessment: Report given to RN  Post vital signs: Reviewed  Last Vitals:  Vitals Value Taken Time  BP    Temp    Pulse    Resp    SpO2      Last Pain:  Vitals:   05/02/18 0706  PainSc: 0-No pain      Patients Stated Pain Goal: 6 (71/06/26 9485)  Complications: No apparent anesthesia complications

## 2018-05-02 NOTE — Discharge Instructions (Signed)
Please discharge patient when stable, will follow up today with Dr. Jemiah Ellenburg at the [facility] office immediately following discharge.  Leave shield in place until visit.  All paperwork with discharge instructions will be given at the office. ° °

## 2018-05-02 NOTE — Anesthesia Postprocedure Evaluation (Signed)
Anesthesia Post Note  Patient: Raymond Walker.  Procedure(s) Performed: CATARACT EXTRACTION PHACO AND INTRAOCULAR LENS PLACEMENT (IOC) (Right Eye)  Patient location during evaluation: Short Stay Anesthesia Type: MAC Level of consciousness: awake and alert and oriented Pain management: pain level controlled Vital Signs Assessment: post-procedure vital signs reviewed and stable Respiratory status: spontaneous breathing Cardiovascular status: blood pressure returned to baseline Postop Assessment: no apparent nausea or vomiting Anesthetic complications: no     Last Vitals: There were no vitals filed for this visit.  Last Pain:  Vitals:   05/02/18 0706  PainSc: 0-No pain                 Vaunda Gutterman

## 2018-05-02 NOTE — Op Note (Signed)
Date of procedure: 05/02/18  Pre-operative diagnosis: Visually significant cataract, Right Eye (H25.811)  Post-operative diagnosis: Visually significant cataract, Right Eye  Procedure: Removal of cataract via phacoemulsification and insertion of intra-ocular lens Wynetta Emery and [employer] PCB00  +13.5D into the capsular bag of the Right Eye  Attending surgeon: Gerda Diss. Latrice Storlie, MD, MA  Anesthesia: MAC, Topical [place]  Complications: None  Estimated Blood Loss: <84m (minimal)  Specimens: None  Implants: As above  Indications:  Visually significant cataract, Right Eye  Procedure:  The patient was seen and identified in the pre-operative area. The operative eye was identified and dilated.  The operative eye was marked.  Topical anesthesia was administered to the operative eye.     The patient was then to the operative suite and placed in the supine position.  A timeout was performed confirming the patient, procedure to be performed, and all other relevant information.   The patient's face was prepped and draped in the usual fashion for intra-ocular surgery.  A lid speculum was placed into the operative eye and the surgical microscope moved into place and focused.  A superotemporal paracentesis was created using a 20 gauge paracentesis blade.  Shugarcaine was injected into the anterior chamber.  Viscoelastic was injected into the anterior chamber.  A temporal clear-corneal main wound incision was created using a 2.463mmicrokeratome.  A continuous curvilinear capsulorrhexis was initiated using an irrigating cystitome and completed using capsulorrhexis forceps.  Hydrodissection and hydrodeliniation were performed.  Viscoelastic was injected into the anterior chamber.  A phacoemulsification handpiece and a chopper as a second instrument were used to remove the nucleus and epinucleus. The irrigation/aspiration handpiece was used to remove any remaining cortical material.   The capsular bag was  reinflated with viscoelastic, checked, and found to be intact.  The intraocular lens was inserted into the capsular bag and dialed into place using a Kuglen hook.  The irrigation/aspiration handpiece was used to remove any remaining viscoelastic.  The clear corneal wound and paracentesis wounds were then hydrated and checked with Weck-Cels to be watertight.  The lid-speculum and drape was removed, and the patient's face was cleaned with a wet and dry 4x4.  Maxitrol was instilled in the eye before a clear shield was taped over the eye. The patient was taken to the post-operative care unit in good condition, having tolerated the procedure well.  Post-Op Instructions: [facility] and will receive all other orders and instructions.

## 2018-05-02 NOTE — Anesthesia Preprocedure Evaluation (Signed)
Anesthesia Evaluation  Patient identified by MRN, date of birth, ID band Patient awake    Reviewed: Allergy & Precautions, H&P , NPO status , Patient's Chart, lab work & pertinent test results, reviewed documented beta blocker date and time   Airway Mallampati: II  TM Distance: >3 FB Neck ROM: full    Dental  (+) Poor Dentition   Pulmonary neg pulmonary ROS, Current Smoker,    Pulmonary exam normal breath sounds clear to auscultation       Cardiovascular Exercise Tolerance: Good + CAD and + Peripheral Vascular Disease  negative cardio ROS   Rhythm:regular Rate:Normal     Neuro/Psych negative neurological ROS  negative psych ROS   GI/Hepatic negative GI ROS, Neg liver ROS,   Endo/Other  negative endocrine ROS  Renal/GU Renal diseasenegative Renal ROS  negative genitourinary   Musculoskeletal   Abdominal   Peds  Hematology negative hematology ROS (+)   Anesthesia Other Findings   Reproductive/Obstetrics negative OB ROS                             Anesthesia Physical Anesthesia Plan  ASA: III  Anesthesia Plan: MAC   Post-op Pain Management:    Induction:   PONV Risk Score and Plan:   Airway Management Planned:   Additional Equipment:   Intra-op Plan:   Post-operative Plan:   Informed Consent: I have reviewed the patients History and Physical, chart, labs and discussed the procedure including the risks, benefits and alternatives for the proposed anesthesia with the patient or authorized representative who has indicated his/her understanding and acceptance.   Dental Advisory Given  Plan Discussed with: CRNA  Anesthesia Plan Comments:         Anesthesia Quick Evaluation

## 2018-05-05 ENCOUNTER — Encounter (HOSPITAL_COMMUNITY): Payer: Self-pay | Admitting: Ophthalmology

## 2018-05-08 NOTE — Progress Notes (Addendum)
[facility] Note  05/09/2018     CHIEF COMPLAINT Patient presents for Retina Follow Up   HISTORY OF PRESENT ILLNESS: Raymond Walker. is a 67 y.o. male who presents to the clinic today for:   HPI    Retina Follow Up    Patient presents with  CRVO/BRVO.  In right eye.  This started 3 months ago.  Severity is mild.  Duration of 2 weeks.  Since onset it is gradually improving.  I, the attending physician,  performed the HPI with the patient and updated documentation appropriately.          Comments    Patient here for follow-up for BVO OS. Pt states he had CE with IOL OD with Dr. Marisa Hua about 2 weeks ago. Vision has improved significantly OD.        Last edited by Bernarda Caffey, MD on 05/09/2018 11:08 AM. (History)    Pt states he had cataract sx OD and is very happy, states vision is a lot better, pt states he is having sx OS in July  Referring physician: [name], MD [place],  [id number]  HISTORICAL INFORMATION:   Selected notes from the MEDICAL RECORD NUMBER Referred by Dr. 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 Cotter) [BCVA OD: CF OS: 20/50] Ocular Hx- cataract OU, PVD PMH- HTN     CURRENT MEDICATIONS: Current Outpatient Medications (Ophthalmic Drugs)  Medication Sig  . ILEVRO 0.3 % ophthalmic suspension Apply 1 drop to eye as directed.  . prednisoLONE acetate (PRED FORTE) 1 % ophthalmic suspension 1 drop See admin instructions.  . moxifloxacin (VIGAMOX) 0.5 % ophthalmic solution Apply 1 drop to eye as directed.   No current facility-administered medications for this visit.  (Ophthalmic Drugs)   Current Outpatient Medications (Other)  Medication Sig  . acetaminophen (TYLENOL) 500 MG tablet Take 500 mg by mouth 2 (two) times daily.    Current Facility-Administered Medications (Other)  Medication Route  . Bevacizumab (AVASTIN) SOLN 1.25 mg Intravitreal      REVIEW OF  SYSTEMS: ROS    Positive for: Eyes   Negative for: Constitutional, Gastrointestinal, Neurological, Skin, Genitourinary, Musculoskeletal, HENT, Endocrine, Cardiovascular, Respiratory, Psychiatric, Allergic/Imm, Heme/Lymph   Last edited by Roselee Nova D on 05/09/2018 10:03 AM. (History)       ALLERGIES No Known Allergies  PAST MEDICAL HISTORY Past Medical History:  Diagnosis Date  . COPD (chronic obstructive pulmonary disease) ([place])   . Erectile dysfunction   . Impaired glucose tolerance   . Nephrolithiasis   . Pancreatitis, recurrent    Past Surgical History:  Procedure Laterality Date  . CATARACT EXTRACTION W/PHACO Right 05/02/2018   Procedure: CATARACT EXTRACTION PHACO AND INTRAOCULAR LENS PLACEMENT (IOC);  Surgeon: Baruch Goldmann, MD;  Location: [facility];  Service: Ophthalmology;  Laterality: Right;  CDE: 11.11  . CHOLECYSTECTOMY    . ORCHIECTOMY    . VASECTOMY      FAMILY HISTORY Family History  Problem Relation Age of Onset  . Hypertension Father   . Coronary artery disease Father   . Coronary artery disease Mother   . Cancer Mother        Breast    SOCIAL HISTORY Social History   Tobacco Use  . Smoking status: Current Every Day Smoker    Packs/day: 1.50    Types: Cigarettes  . Smokeless tobacco: Never Used  Substance Use Topics  . Alcohol use:  No  . Drug use: No         OPHTHALMIC EXAM:  Base Eye Exam    Visual Acuity (Snellen - Linear)      Right Left   Dist [place] 20/40 +1 20/70 -3   Dist ph [place] 20/25 -1 20/60 +1       Tonometry (Tonopen, 10:17 AM)      Right Left   Pressure 14 15       Pupils      Dark Light Shape React APD   Right 3 2 Round Slow None   Left 3 2 Round Slow None       Visual Fields (Counting fingers)      Left Right    Full Full       Extraocular Movement      Right Left    Full, Ortho Full, Ortho       Neuro/Psych    Oriented x3:  Yes   Mood/Affect:  Normal       Dilation    Both eyes:  1.0% Mydriacyl, 2.5%  Phenylephrine @ 10:17 AM        Slit Lamp and Fundus Exam    External Exam      Right Left   External Brow ptosis Brow ptosis       Slit Lamp Exam      Right Left   Lids/Lashes Meibomian gland dysfunction, Telangiectasia Meibomian gland dysfunction, Telangiectasia   Conjunctiva/Sclera White and quiet White and quiet   Cornea Mild EDMD, Arcus, well healed cataract wound 2+ Punctate epithelial erosions, para central scar, Superior limbus Neovascularization   Anterior Chamber Deep and quiet Deep and quiet   Iris Round and dilated Round and dilated   Lens Posterior chamber intraocular lens in perfect position 2+ Nuclear sclerosis, 2+ Cortical cataract, 1+ Posterior subcapsular cataract   Vitreous Vitreous syneresis Vitreous syneresis       Fundus Exam      Right Left   Disc Normal Normal   C/D Ratio 0.5 0.5   Macula Flat, Retinal pigment epithelial mottling, No heme or edema Superior-temporal edema - slightly worse, Exudates superior macula, IRH, telangiectatic vessels, small flame heme SN to fovea, Blunted foveal reflex   Vessels Vascular attenuation, AV crossing changes Tortuous, AV crossing changes - greatest superiorly   Periphery Attached Attached        Refraction    Manifest Refraction      Sphere Cylinder Axis Dist VA   Right [place] +1.00 068 20/20   Left -2.75 +0.75 090 20/50          IMAGING AND PROCEDURES  Imaging and Procedures for 03/31/18  OCT, Retina - OU - Both Eyes       Right Eye Quality was borderline. Central Foveal Thickness: 289. Progression has been stable. Findings include normal foveal contour, no IRF, no SRF.   Left Eye Quality was good. Central Foveal Thickness: 321. Progression has worsened. Findings include abnormal foveal contour, intraretinal fluid, no SRF, outer retinal atrophy, intraretinal hyper-reflective material (Interval worsening of IRF).   Notes *Images captured and stored on drive  Diagnosis / Impression:  OD: NFP, No  IRF/SRF OS: CME supratemporal macula with mild interval worsening  Clinical management:  See below  Abbreviations: NFP - Normal foveal profile. CME - cystoid macular edema. PED - pigment epithelial detachment. IRF - intraretinal fluid. SRF - subretinal fluid. EZ - ellipsoid zone. ERM - epiretinal membrane. ORA - outer retinal atrophy. ORT - outer  retinal tubulation. SRHM - subretinal hyper-reflective material         Intravitreal Injection, Pharmacologic Agent - OS - Left Eye       Time Out 05/09/2018. 11:26 AM. Confirmed correct patient, procedure, site, and patient consented.   Anesthesia Topical anesthesia was used. Anesthetic medications included Lidocaine 2%, Proparacaine 0.5%.   Procedure Preparation included 5% betadine to ocular surface, eyelid speculum. A supplied needle was used.   Injection: 1.25 mg Bevacizumab 1.25mg /0.57ml   NDC: 62703-500-93    Lot: [id number]    Expiration Date: 07/09/2018   Route: Intravitreal   Site: Left Eye   Waste: 0 mg  Post-op Post injection exam found visual acuity of at least counting fingers. The patient tolerated the procedure well. There were no complications. The patient received written and verbal post procedure care education.                 ASSESSMENT/PLAN:    ICD-10-CM   1. Branch retinal vein occlusion of left eye with macular edema H34.8320 Intravitreal Injection, Pharmacologic Agent - OS - Left Eye    Bevacizumab (AVASTIN) SOLN 1.25 mg  2. Retinal edema H35.81 OCT, Retina - OU - Both Eyes  3. Pseudophakia of right eye Z96.1   4. Combined forms of age-related cataract of left eye H25.812     1,2. BRVO with CME OS -  - S/P IVA OS #1 (03.13.19), #2 (04.12.19) -- 6 wk interval - interval worsening of BCVA (20/60) and OCT -- slightly increased CME superotemporal macula and fovea - suspect decline is due to delayed follow up 6 wks rather than 4 - recommend IVA OS #3 today, 05.24.19 w/ return in 4 wks - RBA of  procedure discussed, questions answered - informed consent obtained and signed - see procedure note - f/u in 4 wks  4. Pseudophakia OD  - s/p CE/IOL OD 05/02/18 Marisa Hua)  - beautiful surgery, doing well  - monitor  5. Combined form age-related cataract OS-  - The symptoms of cataract, surgical options, and treatments and risks were discussed with patient. - discussed diagnosis and progression - visually significant OS - under the expert care of Dr. Marisa Hua - pt reports cataract surgery scheduled for July - if still receiving injections, will try to coordinate shot to be received 1-2 wks before cataract surgery   Ophthalmic Meds Ordered this visit:  Meds ordered this encounter  Medications  . Bevacizumab (AVASTIN) SOLN 1.25 mg       Return in about 1 month (around 06/06/2018) for DFE/OCT/injection.  There are no Patient Instructions on file for this visit.   Explained the diagnoses, plan, and follow up with the patient and they expressed understanding.  Patient expressed understanding of the importance of proper follow up care.   This document serves as a record of services personally performed by Gardiner Sleeper, MD, PhD. It was created on their behalf by Ernest Mallick, OA, an ophthalmic assistant. The creation of this record is the provider's dictation and/or activities during the visit.    Electronically signed by: Ernest Mallick, OA  05.23.2019 1:51 AM   Gardiner Sleeper, M.D., Ph.D. Diseases & Surgery of the Retina and [place] 05/09/18  I have reviewed the above documentation for accuracy and completeness, and I agree with the above. Gardiner Sleeper, M.D., Ph.D. 05/12/18 1:51 AM     Abbreviations: M myopia (nearsighted); A astigmatism; H hyperopia (farsighted); P presbyopia; Mrx spectacle prescription;  CTL  contact lenses; OD right eye; OS left eye; OU both eyes  XT exotropia; ET esotropia; PEK punctate epithelial keratitis; PEE  punctate epithelial erosions; DES dry eye syndrome; MGD meibomian gland dysfunction; ATs artificial tears; PFAT's preservative free artificial tears; [place] nuclear sclerotic cataract; PSC posterior subcapsular cataract; ERM epi-retinal membrane; PVD posterior vitreous detachment; RD retinal detachment; DM diabetes mellitus; DR diabetic retinopathy; NPDR non-proliferative diabetic retinopathy; PDR proliferative diabetic retinopathy; CSME clinically significant macular edema; DME diabetic macular edema; dbh dot blot hemorrhages; CWS cotton wool spot; POAG primary open angle glaucoma; C/D cup-to-disc ratio; HVF humphrey visual field; GVF goldmann visual field; OCT optical coherence tomography; IOP intraocular pressure; BRVO Branch retinal vein occlusion; CRVO central retinal vein occlusion; CRAO central retinal artery occlusion; BRAO branch retinal artery occlusion; RT retinal tear; SB scleral buckle; PPV pars plana vitrectomy; VH Vitreous hemorrhage; PRP panretinal laser photocoagulation; IVK intravitreal kenalog; VMT vitreomacular traction; MH Macular hole;  NVD neovascularization of the disc; NVE neovascularization elsewhere; AREDS age related eye disease study; ARMD age related macular degeneration; POAG primary open angle glaucoma; EBMD epithelial/anterior basement membrane dystrophy; ACIOL anterior chamber intraocular lens; IOL intraocular lens; PCIOL posterior chamber intraocular lens; Phaco/IOL phacoemulsification with intraocular lens placement; [place] photorefractive keratectomy; LASIK laser assisted in situ keratomileusis; HTN hypertension; DM diabetes mellitus; COPD chronic obstructive pulmonary disease

## 2018-05-09 ENCOUNTER — Ambulatory Visit (INDEPENDENT_AMBULATORY_CARE_PROVIDER_SITE_OTHER): Payer: Medicare Other | Admitting: Ophthalmology

## 2018-05-09 ENCOUNTER — Encounter (INDEPENDENT_AMBULATORY_CARE_PROVIDER_SITE_OTHER): Payer: Self-pay | Admitting: Ophthalmology

## 2018-05-09 DIAGNOSIS — H34832 Tributary (branch) retinal vein occlusion, left eye, with macular edema: Secondary | ICD-10-CM

## 2018-05-09 DIAGNOSIS — H3581 Retinal edema: Secondary | ICD-10-CM

## 2018-05-09 DIAGNOSIS — Z961 Presence of intraocular lens: Secondary | ICD-10-CM

## 2018-05-09 DIAGNOSIS — H25812 Combined forms of age-related cataract, left eye: Secondary | ICD-10-CM

## 2018-05-11 DIAGNOSIS — H34832 Tributary (branch) retinal vein occlusion, left eye, with macular edema: Secondary | ICD-10-CM | POA: Diagnosis not present

## 2018-05-11 DIAGNOSIS — H3581 Retinal edema: Secondary | ICD-10-CM | POA: Diagnosis not present

## 2018-05-11 MED ORDER — BEVACIZUMAB CHEMO INJECTION 1.25MG/0.05ML SYRINGE FOR KALEIDOSCOPE
1.2500 mg | INTRAVITREAL | Status: DC
  Administered 2018-05-11: 1.25 mg via INTRAVITREAL

## 2018-05-12 ENCOUNTER — Encounter (INDEPENDENT_AMBULATORY_CARE_PROVIDER_SITE_OTHER): Payer: Self-pay | Admitting: Ophthalmology

## 2018-06-05 NOTE — Progress Notes (Signed)
[facility] Note  06/09/2018     CHIEF COMPLAINT Patient presents for Retina Follow Up   HISTORY OF PRESENT ILLNESS: Raymond Walker. is a 67 y.o. male who presents to the clinic today for:   HPI    Retina Follow Up    Patient presents with  CRVO/BRVO.  In left eye.  This started 4 months ago.  Severity is moderate.  Duration of 4 months.  Since onset it is stable.          Comments    Patient states had CE with IOL OD on 05-02-2018 with Dr. Marisa Hua and vision improved OD since CE with IOL. Vision seems about the same OS (still fuzzy). Has CE with IOL OS scheduled for next month.        Last edited by Bernarda Caffey, MD on 06/10/2018 11:30 PM. (History)    Pt states he had cataract sx OD and is very happy, states vision is a lot better, pt states he is having sx OS in July  Referring physician: [name], MD [place], [place] [address]  HISTORICAL INFORMATION:   Selected notes from the MEDICAL RECORD NUMBER Referred by Dr. 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 Cotter) [BCVA OD: CF OS: 20/50] Ocular Hx- cataract OU, PVD PMH- HTN     CURRENT MEDICATIONS: Current Outpatient Medications (Ophthalmic Drugs)  Medication Sig  . ILEVRO 0.3 % ophthalmic suspension Apply 1 drop to eye as directed.  . moxifloxacin (VIGAMOX) 0.5 % ophthalmic solution Apply 1 drop to eye as directed.  . prednisoLONE acetate (PRED FORTE) 1 % ophthalmic suspension 1 drop See admin instructions.   No current facility-administered medications for this visit.  (Ophthalmic Drugs)   Current Outpatient Medications (Other)  Medication Sig  . acetaminophen (TYLENOL) 500 MG tablet Take 500 mg by mouth 2 (two) times daily.    Current Facility-Administered Medications (Other)  Medication Route  . Bevacizumab (AVASTIN) SOLN 1.25 mg Intravitreal  . Bevacizumab (AVASTIN) SOLN 1.25 mg Intravitreal      REVIEW OF SYSTEMS: ROS    Positive for: Eyes   Negative for: Constitutional, Gastrointestinal, Neurological, Skin, Genitourinary, Musculoskeletal, HENT, Endocrine, Cardiovascular, Respiratory, Psychiatric, Allergic/Imm, Heme/Lymph   Last edited by Roselee Nova D on 06/09/2018 10:06 AM. (History)       ALLERGIES No Known Allergies  PAST MEDICAL HISTORY Past Medical History:  Diagnosis Date  . COPD (chronic obstructive pulmonary disease) ([place])   . Erectile dysfunction   . Impaired glucose tolerance   . Nephrolithiasis   . Pancreatitis, recurrent    Past Surgical History:  Procedure Laterality Date  . CATARACT EXTRACTION W/PHACO Right 05/02/2018   Procedure: CATARACT EXTRACTION PHACO AND INTRAOCULAR LENS PLACEMENT (IOC);  Surgeon: Baruch Goldmann, MD;  Location: [facility];  Service: Ophthalmology;  Laterality: Right;  CDE: 11.11  . CHOLECYSTECTOMY    . ORCHIECTOMY    . VASECTOMY      FAMILY HISTORY Family History  Problem Relation Age of Onset  . Hypertension Father   . Coronary artery disease Father   . Coronary artery disease Mother   . Cancer Mother        Breast    SOCIAL HISTORY Social History   Tobacco Use  . Smoking status: Current Every Day Smoker    Packs/day: 1.50    Types: Cigarettes  . Smokeless tobacco: Never Used  Substance Use Topics  . Alcohol use:  No  . Drug use: No         OPHTHALMIC EXAM:  Base Eye Exam    Visual Acuity (Snellen - Linear)      Right Left   Dist [place] 20/40 -1    Dist [place]  20/40   Dist ph [place] 20/25 -1    Dist ph cc  20/40   Correction:  Glasses       Tonometry (Tonopen, 10:23 AM)      Right Left   Pressure 14 15       Pupils      Dark Light Shape React APD   Right 2 1.5 Round Slow None   Left 3 2.5 Round Slow None       Visual Fields (Counting fingers)      Left Right    Full Full       Extraocular Movement      Right Left    Full, Ortho Full, Ortho       Neuro/Psych    Oriented x3:  Yes   Mood/Affect:  Normal       Dilation     Both eyes:  1.0% Mydriacyl, 2.5% Phenylephrine @ 10:23 AM        Slit Lamp and Fundus Exam    External Exam      Right Left   External Brow ptosis Brow ptosis       Slit Lamp Exam      Right Left   Lids/Lashes Meibomian gland dysfunction, Telangiectasia Meibomian gland dysfunction, Telangiectasia   Conjunctiva/Sclera White and quiet White and quiet   Cornea Mild EDMD, Arcus, well healed cataract wound 2+ Punctate epithelial erosions, para central scar, Superior limbus Neovascularization   Anterior Chamber Deep and quiet Deep and quiet   Iris Round and dilated Round and dilated   Lens Posterior chamber intraocular lens in perfect position 2+ Nuclear sclerosis, 2+ Cortical cataract, 1+ Posterior subcapsular cataract   Vitreous Vitreous syneresis Vitreous syneresis       Fundus Exam      Right Left   Disc Normal Normal   C/D Ratio 0.5 0.5   Macula Flat, Retinal pigment epithelial mottling, No heme or edema Superior-temporal edema - slightly improved, Exudates superior macula - clearing, IRH, telangiectatic vessels, small flame heme SN to fovea - resolved, Blunted foveal reflex   Vessels Vascular attenuation, AV crossing changes Tortuous, AV crossing changes - greatest superiorly   Periphery Attached Attached        Refraction    Wearing Rx      Sphere Cylinder Axis   Right -5.75 +0.50 028   Left -2.25 +1.00 020   Type:  SVL       Manifest Refraction      Sphere Cylinder Axis Dist VA   Right +0.50 +0.75 090 20/25-1   Left -2.25 +1.25 022 20/40-2          IMAGING AND PROCEDURES  Imaging and Procedures for 03/31/18  OCT, Retina - OU - Both Eyes       Right Eye Quality was good. Central Foveal Thickness: 303. Progression has been stable. Findings include normal foveal contour, no IRF, no SRF, vitreomacular adhesion .   Left Eye Quality was good. Central Foveal Thickness: 299. Progression has improved. Findings include abnormal foveal contour, intraretinal fluid, no  SRF, outer retinal atrophy, intraretinal hyper-reflective material (Interval improvement of IRF, but still present).   Notes *Images captured and stored on drive  Diagnosis / Impression:  OD: NFP, No IRF/SRF OS: CME supratemporal macula with interval improvement  Clinical management:  See below  Abbreviations: NFP - Normal foveal profile. CME - cystoid macular edema. PED - pigment epithelial detachment. IRF - intraretinal fluid. SRF - subretinal fluid. EZ - ellipsoid zone. ERM - epiretinal membrane. ORA - outer retinal atrophy. ORT - outer retinal tubulation. SRHM - subretinal hyper-reflective material         Intravitreal Injection, Pharmacologic Agent - OS - Left Eye       Time Out 06/09/2018. 11:24 AM. Confirmed correct patient, procedure, site, and patient consented.   Anesthesia Topical anesthesia was used. Anesthetic medications included Tetracaine 0.5%, Lidocaine 2%.   Procedure Preparation included 5% betadine to ocular surface. A supplied needle was used.   Injection: 1.25 mg Bevacizumab (AVASTIN) SOLN 1.25 mg   NDC: 33007-622-63    Lot: [id number]     Expiration Date: 07/31/2018   Route: Intravitreal   Site: Left Eye   Waste: 0 mg  Post-op Post injection exam found visual acuity of at least counting fingers. The patient tolerated the procedure well. There were no complications. The patient received written and verbal post procedure care education.                 ASSESSMENT/PLAN:    ICD-10-CM   1. Branch retinal vein occlusion of left eye with macular edema H34.8320 OCT, Retina - OU - Both Eyes    Intravitreal Injection, Pharmacologic Agent - OS - Left Eye    Bevacizumab (AVASTIN) SOLN 1.25 mg  2. Retinal edema H35.81 OCT, Retina - OU - Both Eyes  3. Pseudophakia of right eye Z96.1   4. Combined forms of age-related cataract of left eye H25.812     1,2. BRVO with CME OS -  - S/P IVA OS #1 (03.13.19), #2 (04.12.19) -- 6 wk interval, #3 (05.24.19)  -- 4 wk interval - interval improvement in BCVA (20/60) and CME on OCT - had a decline at prior visit due to delayed follow up 6 wks rather than 4 - recommend IVA OS #4 today, (06.24.19) - RBA of procedure discussed, questions answered - informed consent obtained and signed - see procedure note - will consider switching therapy if improvement slows or stops - f/u in 5-6 wks (week of July 29th) to allow at least 1.5 wks post cataract surgery OS  4. Pseudophakia OD  - s/p CE/IOL OD 05/02/18 05/15/18)  - beautiful surgery, doing well  - monitor  5. Combined form age-related cataract OS-  - The symptoms of cataract, surgical options, and treatments and risks were discussed with patient. - discussed diagnosis and progression - visually significant OS - under the expert care of Dr. Marisa Hua - pt reports cataract surgery scheduled for July 19th - will try to coordinate next injection to be given 1-2 weeks after cataract surgery   Ophthalmic Meds Ordered this visit:  Meds ordered this encounter  Medications  . Bevacizumab (AVASTIN) SOLN 1.25 mg       Return in about 5 weeks (around 07/14/2018) for F/U BRVO w/ CME OS, DFE, OCT.  There are no Patient Instructions on file for this visit.   Explained the diagnoses, plan, and follow up with the patient and they expressed understanding.  Patient expressed understanding of the importance of proper follow up care.   This document serves as a record of services personally performed by 07/27/2018, MD, PhD. It was created on their behalf by Gardiner Sleeper, [place], a certified  ophthalmic assistant. The creation of this record is the provider's dictation and/or activities during the visit.  Electronically signed by: Catha Brow, [place]  06.20.19 11:31 PM   Gardiner Sleeper, M.D., Ph.D. Diseases & Surgery of the Retina and Vitreous [facility]  I have reviewed the above documentation for accuracy and completeness, and  I agree with the above. Gardiner Sleeper, M.D., Ph.D. 06/10/18 11:37 PM    Abbreviations: M myopia (nearsighted); A astigmatism; H hyperopia (farsighted); P presbyopia; Mrx spectacle prescription;  CTL contact lenses; OD right eye; OS left eye; OU both eyes  XT exotropia; ET esotropia; PEK punctate epithelial keratitis; PEE punctate epithelial erosions; DES dry eye syndrome; MGD meibomian gland dysfunction; ATs artificial tears; PFAT's preservative free artificial tears; [place] nuclear sclerotic cataract; PSC posterior subcapsular cataract; ERM epi-retinal membrane; PVD posterior vitreous detachment; RD retinal detachment; DM diabetes mellitus; DR diabetic retinopathy; NPDR non-proliferative diabetic retinopathy; PDR proliferative diabetic retinopathy; CSME clinically significant macular edema; DME diabetic macular edema; dbh dot blot hemorrhages; CWS cotton wool spot; POAG primary open angle glaucoma; C/D cup-to-disc ratio; HVF humphrey visual field; GVF goldmann visual field; OCT optical coherence tomography; IOP intraocular pressure; BRVO Branch retinal vein occlusion; CRVO central retinal vein occlusion; CRAO central retinal artery occlusion; BRAO branch retinal artery occlusion; RT retinal tear; SB scleral buckle; PPV pars plana vitrectomy; VH Vitreous hemorrhage; PRP panretinal laser photocoagulation; IVK intravitreal kenalog; VMT vitreomacular traction; MH Macular hole;  NVD neovascularization of the disc; NVE neovascularization elsewhere; AREDS age related eye disease study; ARMD age related macular degeneration; POAG primary open angle glaucoma; EBMD epithelial/anterior basement membrane dystrophy; ACIOL anterior chamber intraocular lens; IOL intraocular lens; PCIOL posterior chamber intraocular lens; Phaco/IOL phacoemulsification with intraocular lens placement; [place] photorefractive keratectomy; LASIK laser assisted in situ keratomileusis; HTN hypertension; DM diabetes mellitus; COPD chronic obstructive  pulmonary disease

## 2018-06-09 ENCOUNTER — Ambulatory Visit (INDEPENDENT_AMBULATORY_CARE_PROVIDER_SITE_OTHER): Payer: Medicare Other | Admitting: Ophthalmology

## 2018-06-09 ENCOUNTER — Encounter (INDEPENDENT_AMBULATORY_CARE_PROVIDER_SITE_OTHER): Payer: Self-pay | Admitting: Ophthalmology

## 2018-06-09 DIAGNOSIS — H25812 Combined forms of age-related cataract, left eye: Secondary | ICD-10-CM | POA: Diagnosis not present

## 2018-06-09 DIAGNOSIS — H34832 Tributary (branch) retinal vein occlusion, left eye, with macular edema: Secondary | ICD-10-CM

## 2018-06-09 DIAGNOSIS — Z961 Presence of intraocular lens: Secondary | ICD-10-CM | POA: Diagnosis not present

## 2018-06-09 DIAGNOSIS — H3581 Retinal edema: Secondary | ICD-10-CM

## 2018-06-10 ENCOUNTER — Encounter (INDEPENDENT_AMBULATORY_CARE_PROVIDER_SITE_OTHER): Payer: Self-pay | Admitting: Ophthalmology

## 2018-06-10 DIAGNOSIS — H34832 Tributary (branch) retinal vein occlusion, left eye, with macular edema: Secondary | ICD-10-CM | POA: Diagnosis not present

## 2018-06-10 MED ORDER — BEVACIZUMAB CHEMO INJECTION 1.25MG/0.05ML SYRINGE FOR KALEIDOSCOPE
1.2500 mg | INTRAVITREAL | Status: DC
  Administered 2018-06-10: 1.25 mg via INTRAVITREAL

## 2018-06-26 DIAGNOSIS — H25812 Combined forms of age-related cataract, left eye: Secondary | ICD-10-CM | POA: Diagnosis not present

## 2018-06-30 ENCOUNTER — Encounter (HOSPITAL_COMMUNITY): Payer: Self-pay

## 2018-06-30 ENCOUNTER — Encounter (HOSPITAL_COMMUNITY)
Admission: RE | Admit: 2018-06-30 | Discharge: 2018-06-30 | Disposition: A | Discharge status: Home / Self Care | Payer: Medicare Other | Source: Ambulatory Visit | Attending: Ophthalmology | Admitting: Ophthalmology

## 2018-07-03 MED ORDER — TETRACAINE HCL 0.5 % OP SOLN
OPHTHALMIC | Status: AC
  Filled 2018-07-03: qty 4

## 2018-07-03 MED ORDER — CYCLOPENTOLATE-PHENYLEPHRINE 0.2-1 % OP SOLN
OPHTHALMIC | Status: AC
  Filled 2018-07-03: qty 2

## 2018-07-03 MED ORDER — LIDOCAINE HCL 3.5 % OP GEL
OPHTHALMIC | Status: AC
  Filled 2018-07-03: qty 1

## 2018-07-03 MED ORDER — LIDOCAINE HCL (PF) 1 % IJ SOLN
INTRAMUSCULAR | Status: AC
  Filled 2018-07-03: qty 2

## 2018-07-03 MED ORDER — PHENYLEPHRINE HCL 2.5 % OP SOLN
OPHTHALMIC | Status: AC
  Filled 2018-07-03: qty 15

## 2018-07-03 MED ORDER — NEOMYCIN-POLYMYXIN-DEXAMETH 3.5-10000-0.1 OP SUSP
OPHTHALMIC | Status: AC
  Filled 2018-07-03: qty 5

## 2018-07-04 ENCOUNTER — Encounter (HOSPITAL_COMMUNITY): Payer: Self-pay | Admitting: *Deleted

## 2018-07-04 ENCOUNTER — Encounter (HOSPITAL_COMMUNITY)
Admission: RE | Disposition: A | Discharge status: Home / Self Care | Payer: Self-pay | Source: Ambulatory Visit | Attending: Ophthalmology

## 2018-07-04 ENCOUNTER — Ambulatory Visit (HOSPITAL_COMMUNITY)
Admission: RE | Admit: 2018-07-04 | Discharge: 2018-07-04 | Disposition: A | Discharge status: Home / Self Care | Payer: Medicare Other | Source: Ambulatory Visit | Attending: Ophthalmology | Admitting: Ophthalmology

## 2018-07-04 ENCOUNTER — Ambulatory Visit (HOSPITAL_COMMUNITY): Payer: Medicare Other | Admitting: Anesthesiology

## 2018-07-04 DIAGNOSIS — I739 Peripheral vascular disease, unspecified: Secondary | ICD-10-CM | POA: Insufficient documentation

## 2018-07-04 DIAGNOSIS — H2512 Age-related nuclear cataract, left eye: Secondary | ICD-10-CM | POA: Diagnosis not present

## 2018-07-04 DIAGNOSIS — I2510 Atherosclerotic heart disease of native coronary artery without angina pectoris: Secondary | ICD-10-CM | POA: Diagnosis not present

## 2018-07-04 DIAGNOSIS — H25812 Combined forms of age-related cataract, left eye: Secondary | ICD-10-CM | POA: Diagnosis not present

## 2018-07-04 DIAGNOSIS — F17200 Nicotine dependence, unspecified, uncomplicated: Secondary | ICD-10-CM | POA: Insufficient documentation

## 2018-07-04 DIAGNOSIS — J449 Chronic obstructive pulmonary disease, unspecified: Secondary | ICD-10-CM | POA: Insufficient documentation

## 2018-07-04 HISTORY — PX: CATARACT EXTRACTION W/PHACO: SHX586

## 2018-07-04 SURGERY — PHACOEMULSIFICATION, CATARACT, WITH IOL INSERTION
Anesthesia: Monitor Anesthesia Care | Site: Eye | Laterality: Left

## 2018-07-04 MED ORDER — POVIDONE-IODINE 5 % OP SOLN
OPHTHALMIC | Status: DC | PRN
  Administered 2018-07-04: 1 via OPHTHALMIC

## 2018-07-04 MED ORDER — MIDAZOLAM HCL 2 MG/2ML IJ SOLN
INTRAMUSCULAR | Status: AC
  Filled 2018-07-04: qty 2

## 2018-07-04 MED ORDER — EPINEPHRINE PF 1 MG/ML IJ SOLN
INTRAOCULAR | Status: DC | PRN
  Administered 2018-07-04: 500 mL

## 2018-07-04 MED ORDER — LACTATED RINGERS IV SOLN
INTRAVENOUS | Status: DC
  Administered 2018-07-04: 07:00:00 via INTRAVENOUS

## 2018-07-04 MED ORDER — LIDOCAINE HCL (PF) 1 % IJ SOLN
INTRAOCULAR | Status: DC | PRN
  Administered 2018-07-04: .9 mL via OPHTHALMIC

## 2018-07-04 MED ORDER — LIDOCAINE HCL 3.5 % OP GEL
1.0000 "application " | Freq: Once | OPHTHALMIC | Status: AC
  Administered 2018-07-04: 1 via OPHTHALMIC

## 2018-07-04 MED ORDER — NEOMYCIN-POLYMYXIN-DEXAMETH 3.5-10000-0.1 OP SUSP
OPHTHALMIC | Status: DC | PRN
Start: 2018-07-04 — End: 2018-07-04
  Administered 2018-07-04: 2 [drp] via OPHTHALMIC

## 2018-07-04 MED ORDER — MIDAZOLAM HCL 2 MG/2ML IJ SOLN
INTRAMUSCULAR | Status: DC | PRN
  Administered 2018-07-04: 2 mg via INTRAVENOUS

## 2018-07-04 MED ORDER — PHENYLEPHRINE HCL 2.5 % OP SOLN
1.0000 [drp] | OPHTHALMIC | Status: AC
  Administered 2018-07-04 (×3): 1 [drp] via OPHTHALMIC

## 2018-07-04 MED ORDER — BSS IO SOLN
INTRAOCULAR | Status: DC | PRN
  Administered 2018-07-04: 15 mL

## 2018-07-04 MED ORDER — CYCLOPENTOLATE-PHENYLEPHRINE 0.2-1 % OP SOLN
1.0000 [drp] | OPHTHALMIC | Status: AC
  Administered 2018-07-04 (×3): 1 [drp] via OPHTHALMIC

## 2018-07-04 MED ORDER — PROVISC 10 MG/ML IO SOLN
INTRAOCULAR | Status: DC | PRN
  Administered 2018-07-04: 0.85 mL via INTRAOCULAR

## 2018-07-04 MED ORDER — SODIUM HYALURONATE 23 MG/ML IO SOLN
INTRAOCULAR | Status: DC | PRN
  Administered 2018-07-04: 0.6 mL via INTRAOCULAR

## 2018-07-04 MED ORDER — TETRACAINE HCL 0.5 % OP SOLN
1.0000 [drp] | OPHTHALMIC | Status: AC
  Administered 2018-07-04 (×3): 1 [drp] via OPHTHALMIC

## 2018-07-04 SURGICAL SUPPLY — 12 items
CLOTH BEACON ORANGE TIMEOUT ST (SAFETY) ×2 IMPLANT
EYE SHIELD UNIVERSAL CLEAR (GAUZE/BANDAGES/DRESSINGS) ×2 IMPLANT
GLOVE BIOGEL PI IND STRL 7.0 (GLOVE) ×2 IMPLANT
GLOVE BIOGEL PI INDICATOR 7.0 (GLOVE) ×2
LENS ALC ACRYL/TECN (Ophthalmic Related) ×2 IMPLANT
NEEDLE HYPO 18GX1.5 BLUNT FILL (NEEDLE) ×2 IMPLANT
PAD ARMBOARD 7.5X6 YLW CONV (MISCELLANEOUS) ×2 IMPLANT
SYR TB 1ML LL NO SAFETY (SYRINGE) ×2 IMPLANT
TAPE SURG TRANSPORE 1 IN (GAUZE/BANDAGES/DRESSINGS) ×1 IMPLANT
TAPE SURGICAL TRANSPORE 1 IN (GAUZE/BANDAGES/DRESSINGS) ×1
VISCOELASTIC ADDITIONAL (OPHTHALMIC RELATED) ×2 IMPLANT
WATER STERILE IRR 250ML POUR (IV SOLUTION) ×2 IMPLANT

## 2018-07-04 NOTE — H&P (Signed)
The H and P was reviewed and updated. The patient was examined.  No changes were found after exam.  The surgical eye was marked.  

## 2018-07-04 NOTE — Op Note (Signed)
Date of procedure: 07/04/18  Pre-operative diagnosis: Visually significant cataract, Left Eye (H25.812)  Post-operative diagnosis: Visually significant cataract, Left Eye  Procedure: Removal of cataract via phacoemulsification and insertion of intra-ocular lens Johnson and [place]  +16.0D into the capsular bag of the Left Eye  Attending surgeon: Gerda Diss. Twila Rappa, MD, MA  Anesthesia: MAC, Topical Ak[place]  Complications: None  Estimated Blood Loss: <19m (minimal)  Specimens: None  Implants: As above  Indications:  Visually significant cataract, Left Eye  Procedure:  The patient was seen and identified in the pre-operative area. The operative eye was identified and dilated.  The operative eye was marked.  Topical anesthesia was administered to the operative eye.     The patient was then to the operative suite and placed in the supine position.  A timeout was performed confirming the patient, procedure to be performed, and all other relevant information.   The patient's face was prepped and draped in the usual fashion for intra-ocular surgery.  A lid speculum was placed into the operative eye and the surgical microscope moved into place and focused.  An inferotemporal paracentesis was created using a 20 gauge paracentesis blade.  Shugarcaine was injected into the anterior chamber.  Viscoelastic was injected into the anterior chamber.  A temporal clear-corneal main wound incision was created using a 2.430mmicrokeratome.  A continuous curvilinear capsulorrhexis was initiated using an irrigating cystitome and completed using capsulorrhexis forceps.  Hydrodissection and hydrodeliniation were performed.  Viscoelastic was injected into the anterior chamber.  A phacoemulsification handpiece and a chopper as a second instrument were used to remove the nucleus and epinucleus. The irrigation/aspiration handpiece was used to remove any remaining cortical material.   The capsular bag was  reinflated with viscoelastic, checked, and found to be intact.  The intraocular lens was inserted into the capsular bag and dialed into place using a Kuglen hook.  The irrigation/aspiration handpiece was used to remove any remaining viscoelastic.  The clear corneal wound and paracentesis wounds were then hydrated and checked with Weck-Cels to be watertight.  The lid-speculum and drape was removed, and the patient's face was cleaned with a wet and dry 4x4.  Maxitrol was instilled in the eye before a clear shield was taped over the eye. The patient was taken to the post-operative care unit in good condition, having tolerated the procedure well.  Post-Op Instructions: [facility] and will receive all other orders and instructions.

## 2018-07-04 NOTE — Addendum Note (Signed)
Addendum  created 07/04/18 1100 by Vista Deck, CRNA   SmartForm saved

## 2018-07-04 NOTE — Transfer of Care (Signed)
Immediate Anesthesia Transfer of Care Note  Patient: Raymond Walker.  Procedure(s) Performed: CATARACT EXTRACTION PHACO AND INTRAOCULAR LENS PLACEMENT (IOC) (Left Eye)  Patient Location: Short Stay  Anesthesia Type:MAC  Level of Consciousness: awake, alert  and patient cooperative  Airway & Oxygen Therapy: Patient Spontanous Breathing  Post-op Assessment: Report given to RN and Post -op Vital signs reviewed and stable  Post vital signs: Reviewed and stable  Last Vitals:  Vitals Value Taken Time  BP 145/82 07/04/2018  7:55 AM  Temp 36.6 C 07/04/2018  7:55 AM  Pulse 69 07/04/2018  7:55 AM  Resp    SpO2 97 % 07/04/2018  7:55 AM    Last Pain:  Vitals:   07/04/18 0755  TempSrc: Oral  PainSc: 0-No pain      Patients Stated Pain Goal: 6 (92/49/32 4199)  Complications: No apparent anesthesia complications

## 2018-07-04 NOTE — Anesthesia Preprocedure Evaluation (Addendum)
Anesthesia Evaluation  Patient identified by MRN, date of birth, ID band Patient awake    Reviewed: Allergy & Precautions, NPO status , Patient's Chart, lab work & pertinent test results  Airway Mallampati: II  TM Distance: >3 FB Neck ROM: Full    Dental no notable dental hx.    Pulmonary neg pulmonary ROS, COPD, Current Smoker,    Pulmonary exam normal breath sounds clear to auscultation       Cardiovascular Exercise Tolerance: Poor + CAD and + Peripheral Vascular Disease  negative cardio ROS Normal cardiovascular examII Rhythm:Regular Rate:Normal  States remotely "light MI in 1990s" Denies recent CP or DOE   Neuro/Psych negative neurological ROS  negative psych ROS   GI/Hepatic negative GI ROS, Neg liver ROS,   Endo/Other  negative endocrine ROS  Renal/GU Renal diseasenegative Renal ROS  negative genitourinary   Musculoskeletal negative musculoskeletal ROS (+)   Abdominal   Peds negative pediatric ROS (+)  Hematology negative hematology ROS (+)   Anesthesia Other Findings   Reproductive/Obstetrics negative OB ROS                            Anesthesia Physical Anesthesia Plan  ASA: II  Anesthesia Plan: MAC   Post-op Pain Management:    Induction: Intravenous  PONV Risk Score and Plan:   Airway Management Planned: Nasal Cannula  Additional Equipment:   Intra-op Plan:   Post-operative Plan:   Informed Consent: I have reviewed the patients History and Physical, chart, labs and discussed the procedure including the risks, benefits and alternatives for the proposed anesthesia with the patient or authorized representative who has indicated his/her understanding and acceptance.   Dental advisory given  Plan Discussed with: CRNA  Anesthesia Plan Comments:        Anesthesia Quick Evaluation

## 2018-07-04 NOTE — Anesthesia Procedure Notes (Signed)
Procedure Name: MAC Date/Time: 07/04/2018 7:32 AM Performed by: Vista Deck, CRNA Pre-anesthesia Checklist: Patient identified, Emergency Drugs available, Suction available, Timeout performed and Patient being monitored Patient Re-evaluated:Patient Re-evaluated prior to induction Oxygen Delivery Method: Nasal Cannula

## 2018-07-04 NOTE — Discharge Instructions (Signed)
Please discharge patient when stable, will follow up today with Dr. Cowan Pilar at the [facility] office immediately following discharge.  Leave shield in place until visit.  All paperwork with discharge instructions will be given at the office. ° °

## 2018-07-04 NOTE — Anesthesia Postprocedure Evaluation (Signed)
Anesthesia Post Note  Patient: Raymond Walker.  Procedure(s) Performed: CATARACT EXTRACTION PHACO AND INTRAOCULAR LENS PLACEMENT ([place]) (Left Eye)  Patient location during evaluation: Short Stay Anesthesia Type: MAC Level of consciousness: awake and alert and patient cooperative Pain management: pain level controlled Vital Signs Assessment: post-procedure vital signs reviewed and stable Respiratory status: spontaneous breathing Cardiovascular status: stable Postop Assessment: no apparent nausea or vomiting Anesthetic complications: no     Last Vitals:  Vitals:   07/04/18 0730 07/04/18 0755  BP: 117/76 (!) 145/82  Pulse:  69  Resp: 19   Temp:  36.6 C  SpO2: 99% 97%    Last Pain:  Vitals:   07/04/18 0755  TempSrc: Oral  PainSc: 0-No pain                 Raymond Walker

## 2018-07-07 ENCOUNTER — Encounter (HOSPITAL_COMMUNITY): Payer: Self-pay | Admitting: Ophthalmology

## 2018-07-07 ENCOUNTER — Encounter (INDEPENDENT_AMBULATORY_CARE_PROVIDER_SITE_OTHER): Payer: Medicare Other | Admitting: Ophthalmology

## 2018-07-10 ENCOUNTER — Ambulatory Visit (INDEPENDENT_AMBULATORY_CARE_PROVIDER_SITE_OTHER): Payer: Medicare Other | Admitting: Family Medicine

## 2018-07-10 ENCOUNTER — Encounter: Payer: Self-pay | Admitting: Family Medicine

## 2018-07-10 VITALS — BP 122/76 | Temp 98.7°F | Ht 72.0 in | Wt 153.6 lb

## 2018-07-10 DIAGNOSIS — R21 Rash and other nonspecific skin eruption: Secondary | ICD-10-CM | POA: Diagnosis not present

## 2018-07-10 NOTE — Progress Notes (Signed)
   Subjective:    Patient ID: Raymond Radon., male    DOB: 02/08/1951, 67 y.o.   MRN: [id number]  HPI Pt here today due to home health nurse wanting patient to get tick bite checked. Pt removed tick yesterday from between toes on right foot.    Patient arrives to the office with a tick bite.  He is very concerned about it.  Very small tick.  Was on probably no more than 12 hours.  This occurred 1 day ago.  Has left a slight bump.  Slightly itchy.  Small red patch.  No headache no fever no weakness no chills no rash elsewhere  Review of Systems  No vomiting no diarrhea  Objective:   Physical Exam  Alert vitals stable, NAD. Blood pressure good on repeat. HEENT normal. Lungs clear. Heart regular rate and rhythm. Small discrete erythematous papule at site of bite      Assessment & Plan:  Impression tick bite.  No evidence of localized infection.  No evidence of tickborne illness.  Many questions answered regarding tick bites and potential risk and warning signs  Greater than 50% of this 15 minute face to face visit was spent in counseling and discussion and coordination of care regarding the above diagnosis/diagnosies

## 2018-07-31 ENCOUNTER — Encounter: Payer: Self-pay | Admitting: Family Medicine

## 2018-07-31 ENCOUNTER — Ambulatory Visit (INDEPENDENT_AMBULATORY_CARE_PROVIDER_SITE_OTHER): Payer: Medicare Other | Admitting: Family Medicine

## 2018-07-31 VITALS — BP 110/78 | Ht 72.0 in | Wt 155.8 lb

## 2018-07-31 DIAGNOSIS — Z1329 Encounter for screening for other suspected endocrine disorder: Secondary | ICD-10-CM

## 2018-07-31 DIAGNOSIS — R634 Abnormal weight loss: Secondary | ICD-10-CM

## 2018-07-31 DIAGNOSIS — J438 Other emphysema: Secondary | ICD-10-CM

## 2018-07-31 DIAGNOSIS — Z0001 Encounter for general adult medical examination with abnormal findings: Secondary | ICD-10-CM | POA: Diagnosis not present

## 2018-07-31 DIAGNOSIS — I700 Atherosclerosis of aorta: Secondary | ICD-10-CM

## 2018-07-31 DIAGNOSIS — Z13220 Encounter for screening for lipoid disorders: Secondary | ICD-10-CM

## 2018-07-31 DIAGNOSIS — Z1211 Encounter for screening for malignant neoplasm of colon: Secondary | ICD-10-CM

## 2018-07-31 DIAGNOSIS — Z0000 Encounter for general adult medical examination without abnormal findings: Secondary | ICD-10-CM

## 2018-07-31 DIAGNOSIS — Z125 Encounter for screening for malignant neoplasm of prostate: Secondary | ICD-10-CM

## 2018-07-31 DIAGNOSIS — Z23 Encounter for immunization: Secondary | ICD-10-CM | POA: Diagnosis not present

## 2018-07-31 DIAGNOSIS — Z79899 Other long term (current) drug therapy: Secondary | ICD-10-CM

## 2018-07-31 NOTE — Progress Notes (Signed)
Subjective:    Patient ID: Raymond Walker., male    DOB: 1951/11/16, 67 y.o.   MRN: [id number]  HPI AWV- Annual Wellness Visit  The patient was seen for their annual wellness visit. The patient's past medical history, surgical history, and family history were reviewed. Pertinent vaccines were reviewed ( tetanus, pneumonia, shingles, flu) The patient's medication list was reviewed and updated.  The height and weight were entered.  BMI recorded in electronic record elsewhere  Cognitive screening was completed. Outcome of Mini - Cog: Fail   Falls /depression screening electronically recorded within record elsewhere  Current tobacco usage: yes, cigarettes  (All patients who use tobacco were given written and verbal information on quitting)  Recent listing of emergency department/hospitalizations over the past year were reviewed.  current specialist the patient sees on a regular basis:    Medicare annual wellness visit patient questionnaire was reviewed.  A written screening schedule for the patient for the next 5-10 years was given. Appropriate discussion of followup regarding next visit was discussed.      Review of Systems  Constitutional: Negative for activity change, appetite change and fever.  HENT: Negative for congestion and rhinorrhea.   Eyes: Negative for discharge.  Respiratory: Positive for shortness of breath. Negative for cough and wheezing.   Cardiovascular: Negative for chest pain.  Gastrointestinal: Negative for abdominal pain, blood in stool and vomiting.  Genitourinary: Negative for difficulty urinating and frequency.  Musculoskeletal: Negative for neck pain.  Skin: Negative for rash.  Allergic/Immunologic: Negative for environmental allergies and food allergies.  Neurological: Negative for weakness and headaches.  Psychiatric/Behavioral: Negative for agitation.       Objective:   Physical Exam  Constitutional: He appears well-developed and  well-nourished.  HENT:  Head: Normocephalic and atraumatic.  Right Ear: External ear normal.  Left Ear: External ear normal.  Nose: Nose normal.  Mouth/Throat: Oropharynx is clear and moist.  Eyes: Pupils are equal, round, and reactive to light. EOM are normal.  Neck: Normal range of motion. Neck supple. No thyromegaly present.  Cardiovascular: Normal rate, regular rhythm and normal heart sounds.  No murmur heard. Pulmonary/Chest: Effort normal and breath sounds normal. No respiratory distress. He has no wheezes.  Abdominal: Soft. Bowel sounds are normal. He exhibits no distension and no mass. There is no tenderness.  Genitourinary: Prostate normal and penis normal.  Musculoskeletal: Normal range of motion. He exhibits no edema.  Lymphadenopathy:    He has no cervical adenopathy.  Neurological: He is alert. He exhibits normal muscle tone.  Skin: Skin is warm and dry. No erythema.  Psychiatric: He has a normal mood and affect. His behavior is normal. Judgment normal.   Patient has a body habitus of COPD Abdomen soft The recent scan showed aortic atherosclerosis I encourage patient to quit smoking We also recommended lab work  Prostate exam normal      Assessment & Plan:  Adult wellness-complete.wellness physical was conducted today. Importance of diet and exercise were discussed in detail.  In addition to this a discussion regarding safety was also covered. We also reviewed over immunizations and gave recommendations regarding current immunization needed for age.  In addition to this additional areas were also touched on including: Preventative health exams needed:  Colonoscopy referral for colonoscopy  Patient was advised yearly wellness exam  Patient also with mild weight loss more than likely related to his dental issues or potentially related to his smoking Await lab work Encourage patient to quit smoking

## 2018-08-01 ENCOUNTER — Other Ambulatory Visit: Payer: Self-pay | Admitting: Family Medicine

## 2018-08-01 ENCOUNTER — Encounter: Payer: Self-pay | Admitting: Family Medicine

## 2018-08-01 ENCOUNTER — Encounter (INDEPENDENT_AMBULATORY_CARE_PROVIDER_SITE_OTHER): Payer: Self-pay | Admitting: *Deleted

## 2018-08-01 DIAGNOSIS — Z0000 Encounter for general adult medical examination without abnormal findings: Secondary | ICD-10-CM | POA: Diagnosis not present

## 2018-08-01 DIAGNOSIS — Z79899 Other long term (current) drug therapy: Secondary | ICD-10-CM | POA: Diagnosis not present

## 2018-08-01 DIAGNOSIS — Z1329 Encounter for screening for other suspected endocrine disorder: Secondary | ICD-10-CM | POA: Diagnosis not present

## 2018-08-01 DIAGNOSIS — Z125 Encounter for screening for malignant neoplasm of prostate: Secondary | ICD-10-CM | POA: Diagnosis not present

## 2018-08-01 DIAGNOSIS — Z13220 Encounter for screening for lipoid disorders: Secondary | ICD-10-CM | POA: Diagnosis not present

## 2018-08-02 LAB — BASIC METABOLIC PANEL WITH GFR
BUN: 8 mg/dL (ref 7–25)
CO2: 28 mmol/L (ref 20–32)
Calcium: 9.2 mg/dL (ref 8.6–10.3)
Chloride: 104 mmol/L (ref 98–110)
Creat: 0.7 mg/dL (ref 0.70–1.25)
GFR, Est African American: 112 mL/min/{1.73_m2} (ref >=60)
GFR, Est Non African American: 97 mL/min/{1.73_m2} (ref >=60)
Glucose, Bld: 96 mg/dL (ref 65–99)
Potassium: 3.7 mmol/L (ref 3.5–5.3)
Sodium: 139 mmol/L (ref 135–146)

## 2018-08-02 LAB — HEPATIC FUNCTION PANEL
AG Ratio: 1.3 (calc) (ref 1.0–2.5)
ALT: 8 U/L — ABNORMAL LOW (ref 9–46)
AST: 11 U/L (ref 10–35)
Albumin: 3.9 g/dL (ref 3.6–5.1)
Alkaline phosphatase (APISO): 98 U/L (ref 40–115)
Bilirubin, Direct: 0.2 mg/dL (ref 0.0–0.2)
Globulin: 3.1 g/dL (calc) (ref 1.9–3.7)
Indirect Bilirubin: 0.4 mg/dL (calc) (ref 0.2–1.2)
Total Bilirubin: 0.6 mg/dL (ref 0.2–1.2)
Total Protein: 7 g/dL (ref 6.1–8.1)

## 2018-08-02 LAB — LIPID PANEL
Cholesterol: 126 mg/dL (ref <200)
HDL: 24 mg/dL — ABNORMAL LOW (ref >40)
LDL Cholesterol (Calc): 80 mg/dL (calc)
Non-HDL Cholesterol (Calc): 102 mg/dL (calc) (ref <130)
Total CHOL/HDL Ratio: 5.3 (calc) — ABNORMAL HIGH (ref <5.0)
Triglycerides: 122 mg/dL (ref <150)

## 2018-08-02 LAB — CBC WITH DIFFERENTIAL/PLATELET
Basophils Absolute: 52 cells/uL (ref 0–200)
Basophils Relative: 1.1 %
Eosinophils Absolute: 71 cells/uL (ref 15–500)
Eosinophils Relative: 1.5 %
HCT: 45 % (ref 38.5–50.0)
Hemoglobin: 15.4 g/dL (ref 13.2–17.1)
Lymphs Abs: 1772 cells/uL (ref 850–3900)
MCH: 29.2 pg (ref 27.0–33.0)
MCHC: 34.2 g/dL (ref 32.0–36.0)
MCV: 85.2 fL (ref 80.0–100.0)
MPV: 11.4 fL (ref 7.5–12.5)
Monocytes Relative: 8.9 %
Neutro Abs: 2388 cells/uL (ref 1500–7800)
Neutrophils Relative %: 50.8 %
Platelets: 111 10*3/uL — ABNORMAL LOW (ref 140–400)
RBC: 5.28 10*6/uL (ref 4.20–5.80)
RDW: 14.4 % (ref 11.0–15.0)
Total Lymphocyte: 37.7 %
WBC mixed population: 418 cells/uL (ref 200–950)
WBC: 4.7 10*3/uL (ref 3.8–10.8)

## 2018-08-02 LAB — TSH: TSH: 2.58 mIU/L (ref 0.40–4.50)

## 2018-08-02 LAB — PSA: PSA: 1.5 ng/mL (ref <=4.0)

## 2018-08-10 ENCOUNTER — Encounter: Payer: Self-pay | Admitting: Family Medicine

## 2018-08-10 DIAGNOSIS — D696 Thrombocytopenia, unspecified: Secondary | ICD-10-CM

## 2018-08-10 HISTORY — DX: Thrombocytopenia, unspecified: D69.6

## 2018-08-28 DIAGNOSIS — H3562 Retinal hemorrhage, left eye: Secondary | ICD-10-CM | POA: Diagnosis not present

## 2018-09-02 NOTE — Progress Notes (Signed)
[facility] Note  09/03/2018     CHIEF COMPLAINT Patient presents for Retina Follow Up   HISTORY OF PRESENT ILLNESS: Raymond Walker. is a 67 y.o. male who presents to the clinic today for:   HPI    Retina Follow Up    Patient presents with  CRVO/BRVO.  In left eye.  Severity is moderate.  Duration of 3 months.  Since onset it is stable.  I, the attending physician,  performed the HPI with the patient and updated documentation appropriately.          Comments    Pt presents for BRVO OS f/u, pt states his vision has been very good, pt states at times OS seems foggy, and other times it seems okay, pt denies FOL, floaters, pain or wavy vision, pt denies the use of gtts, pt states he feels like the injections are working well, so he is ready to receive another one today if needed       Last edited by Bernarda Caffey, MD on 09/03/2018 12:28 PM. (History)    Delayed follow up from 6.24.19. Was supposed to return in 5-6 wks. Underwent CEIOL OS on 7.19.19 w/ Dr. Marisa Hua. Pt states he was having to care for sister and "lost sense of time"; Pt states he has since seen Dr. Jorja Loa and was told to come back to see Dr. Coralyn Pear;   Referring physician: [name], MD [place], [place] [id number]  HISTORICAL INFORMATION:   Selected notes from the MEDICAL RECORD NUMBER Referred by Dr. 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 Cotter) [BCVA OD: CF OS: 20/50] Ocular Hx- cataract OU, PVD PMH- HTN     CURRENT MEDICATIONS: No current outpatient medications on file. (Ophthalmic Drugs)   No current facility-administered medications for this visit.  (Ophthalmic Drugs)   Current Outpatient Medications (Other)  Medication Sig  . acetaminophen (TYLENOL) 500 MG tablet Take 500 mg by mouth every 8 (eight) hours as needed for moderate pain.    Current Facility-Administered Medications (Other)  Medication Route  . Bevacizumab  (AVASTIN) SOLN 1.25 mg Intravitreal  . Bevacizumab (AVASTIN) SOLN 1.25 mg Intravitreal  . Bevacizumab (AVASTIN) SOLN 1.25 mg Intravitreal      REVIEW OF SYSTEMS: ROS    Positive for: Endocrine, Eyes, Respiratory   Negative for: Constitutional, Gastrointestinal, Neurological, Skin, Genitourinary, Musculoskeletal, HENT, Cardiovascular, Psychiatric, Allergic/Imm, Heme/Lymph   Last edited by Debbrah Alar, COT on 09/03/2018  9:17 AM. (History)       ALLERGIES No Known Allergies  PAST MEDICAL HISTORY Past Medical History:  Diagnosis Date  . COPD (chronic obstructive pulmonary disease) ([place])   . Erectile dysfunction   . Impaired glucose tolerance   . Nephrolithiasis   . Pancreatitis, recurrent   . Thrombocytopenia ([place]) 08/10/2018   Staying in the low 100s will follow closely   Past Surgical History:  Procedure Laterality Date  . CATARACT EXTRACTION W/PHACO Right 05/02/2018   Procedure: CATARACT EXTRACTION PHACO AND INTRAOCULAR LENS PLACEMENT (IOC);  Surgeon: Baruch Goldmann, MD;  Location: [facility];  Service: Ophthalmology;  Laterality: Right;  CDE: 11.11  . CATARACT EXTRACTION W/PHACO Left 07/04/2018   Procedure: CATARACT EXTRACTION PHACO AND INTRAOCULAR LENS PLACEMENT (IOC);  Surgeon: Baruch Goldmann, MD;  Location: [facility];  Service: Ophthalmology;  Laterality: Left;  CDE: 7.15  . CHOLECYSTECTOMY    . ORCHIECTOMY    . VASECTOMY  FAMILY HISTORY Family History  Problem Relation Age of Onset  . Hypertension Father   . Coronary artery disease Father   . Coronary artery disease Mother   . Cancer Mother        Breast    SOCIAL HISTORY Social History   Tobacco Use  . Smoking status: Current Every Day Smoker    Packs/day: 1.50    Types: Cigarettes  . Smokeless tobacco: Never Used  Substance Use Topics  . Alcohol use: No  . Drug use: No         OPHTHALMIC EXAM:  Base Eye Exam    Visual Acuity (Snellen - Linear)      Right Left   Dist [place] 20/40 -1 20/50 -2    Dist ph [place] 20/25 NI       Tonometry (Tonopen, 9:23 AM)      Right Left   Pressure 11 10       Pupils      Dark Light Shape React APD   Right 3 1.5 Round Brisk None   Left 3 1.5 Round Brisk None       Visual Fields (Counting fingers)      Left Right    Full Full       Extraocular Movement      Right Left    Full, Ortho Full, Ortho       Neuro/Psych    Oriented x3:  Yes   Mood/Affect:  Normal       Dilation    Both eyes:  1.0% Mydriacyl, 2.5% Phenylephrine @ 9:23 AM        Slit Lamp and Fundus Exam    External Exam      Right Left   External Brow ptosis Brow ptosis       Slit Lamp Exam      Right Left   Lids/Lashes Meibomian gland dysfunction, Telangiectasia Meibomian gland dysfunction, Telangiectasia   Conjunctiva/Sclera White and quiet White and quiet   Cornea Mild EDMD, Arcus, well healed cataract wound 2+ Punctate epithelial erosions, para central scar, Superior limbus Neovascularization   Anterior Chamber Deep and quiet Deep and quiet   Iris Round and dilated Round and dilated   Lens Posterior chamber intraocular lens in perfect position 2+ Nuclear sclerosis, 2+ Cortical cataract, 1+ Posterior subcapsular cataract   Vitreous Vitreous syneresis Vitreous syneresis       Fundus Exam      Right Left   Disc Normal Normal   C/D Ratio 0.5 0.5   Macula Flat, Retinal pigment epithelial mottling, No heme or edema Superior-temporal edema - interval worsening, Exudates superior macula - clearing, IRH, telangiectatic vessels, small flame heme SN to fovea - resolved, Blunted foveal reflex   Vessels Vascular attenuation, AV crossing changes Tortuous, AV crossing changes - greatest superiorly   Periphery Attached Attached          IMAGING AND PROCEDURES  Imaging and Procedures for 03/31/18  OCT, Retina - OU - Both Eyes       Right Eye Quality was good. Central Foveal Thickness: 302. Progression has been stable. Findings include normal foveal contour, no IRF,  no SRF, vitreomacular adhesion .   Left Eye Quality was good. Central Foveal Thickness: 492. Progression has worsened. Findings include abnormal foveal contour, intraretinal fluid, outer retinal atrophy, intraretinal hyper-reflective material, subretinal fluid (Interval worsening of IRF, interval development of SRF).   Notes *Images captured and stored on drive  Diagnosis / Impression:  OD: NFP, No  IRF/SRF OS: CME supratemporal macula with interval worsening of IRF and interval development of SRF  Clinical management:  See below  Abbreviations: NFP - Normal foveal profile. CME - cystoid macular edema. PED - pigment epithelial detachment. IRF - intraretinal fluid. SRF - subretinal fluid. EZ - ellipsoid zone. ERM - epiretinal membrane. ORA - outer retinal atrophy. ORT - outer retinal tubulation. SRHM - subretinal hyper-reflective material         Intravitreal Injection, Pharmacologic Agent - OS - Left Eye       Time Out 09/03/2018. 10:24 AM. Confirmed correct patient, procedure, site, and patient consented.   Anesthesia Topical anesthesia was used. Anesthetic medications included Tetracaine 0.5%, Lidocaine 2%.   Procedure Preparation included 5% betadine to ocular surface, eyelid speculum. A supplied needle was used.   Injection:  1.25 mg Bevacizumab 1.25mg /0.35ml   NDC: 50242-060-01, Lot: [id number] , Expiration date: 10/08/2018   Route: Intravitreal, Site: Left Eye, Waste: 0 mL  Post-op Post injection exam found visual acuity of at least counting fingers. The patient tolerated the procedure well. There were no complications. The patient received written and verbal post procedure care education.                 ASSESSMENT/PLAN:    ICD-10-CM   1. Branch retinal vein occlusion of left eye with macular edema H34.8320 OCT, Retina - OU - Both Eyes    Intravitreal Injection, Pharmacologic Agent - OS - Left Eye    Bevacizumab (AVASTIN) SOLN 1.25 mg  2. Retinal edema  H35.81 OCT, Retina - OU - Both Eyes  3. Pseudophakia of right eye Z96.1   4. Combined forms of age-related cataract of left eye H25.812     1,2. BRVO with CME OS -  - S/P IVA OS #1 (03.13.19), #2 (04.12.19) -- 6 wk interval, #3 (05.24.19) -- 4 wk interval, #4 (06.24.19) -- 12 week interval - delayed follow up due to personal issues -- lost to follow up from 06.24.19 to 09.18.19 - had cataract surgery OS on 7.19.19 since last visit - interval decline in BCVA (20/50 from 20/40) - interval worsening of IRF/CME and interval development of SRF on OCT - recommend IVA OS #5 today, (09.18.19) - RBA of procedure discussed, questions answered - informed consent obtained and signed - see procedure note - F/U 4 weeks  3. Pseudophakia OU  - s/p CE/IOL OD 05/02/18, OS 7.19.19 Marisa Hua)  - beautiful surgeries, doing well  - monitor   Ophthalmic Meds Ordered this visit:  Meds ordered this encounter  Medications  . Bevacizumab (AVASTIN) SOLN 1.25 mg       Return in about 4 weeks (around 10/01/2018) for F/U BRVO OS, DFE, OCT.  There are no Patient Instructions on file for this visit.   Explained the diagnoses, plan, and follow up with the patient and they expressed understanding.  Patient expressed understanding of the importance of proper follow up care.   This document serves as a record of services personally performed by Gardiner Sleeper, MD, PhD. It was created on their behalf by Catha Brow, [place], a certified ophthalmic assistant. The creation of this record is the provider's dictation and/or activities during the visit.  Electronically signed by: Catha Brow, [place]  09.17.19 12:29 PM    Gardiner Sleeper, M.D., Ph.D. Diseases & Surgery of the Retina and Vitreous [facility]  I have reviewed the above documentation for accuracy and completeness, and I agree with the above. Gardiner Sleeper,  M.D., Ph.D. 09/03/18 12:37 PM     Abbreviations: M myopia  (nearsighted); A astigmatism; H hyperopia (farsighted); P presbyopia; Mrx spectacle prescription;  CTL contact lenses; OD right eye; OS left eye; OU both eyes  XT exotropia; ET esotropia; PEK punctate epithelial keratitis; PEE punctate epithelial erosions; DES dry eye syndrome; MGD meibomian gland dysfunction; ATs artificial tears; PFAT's preservative free artificial tears; [place] nuclear sclerotic cataract; PSC posterior subcapsular cataract; ERM epi-retinal membrane; PVD posterior vitreous detachment; RD retinal detachment; DM diabetes mellitus; DR diabetic retinopathy; NPDR non-proliferative diabetic retinopathy; PDR proliferative diabetic retinopathy; CSME clinically significant macular edema; DME diabetic macular edema; dbh dot blot hemorrhages; CWS cotton wool spot; POAG primary open angle glaucoma; C/D cup-to-disc ratio; HVF humphrey visual field; GVF goldmann visual field; OCT optical coherence tomography; IOP intraocular pressure; BRVO Branch retinal vein occlusion; CRVO central retinal vein occlusion; CRAO central retinal artery occlusion; BRAO branch retinal artery occlusion; RT retinal tear; SB scleral buckle; PPV pars plana vitrectomy; VH Vitreous hemorrhage; PRP panretinal laser photocoagulation; IVK intravitreal kenalog; VMT vitreomacular traction; MH Macular hole;  NVD neovascularization of the disc; NVE neovascularization elsewhere; AREDS age related eye disease study; ARMD age related macular degeneration; POAG primary open angle glaucoma; EBMD epithelial/anterior basement membrane dystrophy; ACIOL anterior chamber intraocular lens; IOL intraocular lens; PCIOL posterior chamber intraocular lens; Phaco/IOL phacoemulsification with intraocular lens placement; [place] photorefractive keratectomy; LASIK laser assisted in situ keratomileusis; HTN hypertension; DM diabetes mellitus; COPD chronic obstructive pulmonary disease

## 2018-09-03 ENCOUNTER — Ambulatory Visit (INDEPENDENT_AMBULATORY_CARE_PROVIDER_SITE_OTHER): Payer: Medicare Other | Admitting: Ophthalmology

## 2018-09-03 ENCOUNTER — Encounter (INDEPENDENT_AMBULATORY_CARE_PROVIDER_SITE_OTHER): Payer: Self-pay | Admitting: Ophthalmology

## 2018-09-03 DIAGNOSIS — H34832 Tributary (branch) retinal vein occlusion, left eye, with macular edema: Secondary | ICD-10-CM | POA: Diagnosis not present

## 2018-09-03 DIAGNOSIS — Z961 Presence of intraocular lens: Secondary | ICD-10-CM | POA: Diagnosis not present

## 2018-09-03 DIAGNOSIS — H3581 Retinal edema: Secondary | ICD-10-CM

## 2018-09-03 MED ORDER — BEVACIZUMAB CHEMO INJECTION 1.25MG/0.05ML SYRINGE FOR KALEIDOSCOPE
1.2500 mg | INTRAVITREAL | Status: DC
  Administered 2018-09-03: 1.25 mg via INTRAVITREAL

## 2018-10-01 ENCOUNTER — Encounter (INDEPENDENT_AMBULATORY_CARE_PROVIDER_SITE_OTHER): Payer: Medicare Other | Admitting: Ophthalmology

## 2018-10-31 ENCOUNTER — Ambulatory Visit: Payer: Medicare Other | Admitting: Family Medicine

## 2019-08-19 ENCOUNTER — Encounter (HOSPITAL_COMMUNITY): Payer: Self-pay | Admitting: Emergency Medicine

## 2019-08-19 ENCOUNTER — Other Ambulatory Visit: Payer: Self-pay

## 2019-08-19 ENCOUNTER — Inpatient Hospital Stay (HOSPITAL_COMMUNITY)
Admission: EM | Admit: 2019-08-19 | Discharge: 2019-08-29 | DRG: 252 | Disposition: A | Discharge status: Skilled Nursing Facility | Payer: Medicare Other | Attending: Internal Medicine | Admitting: Internal Medicine

## 2019-08-19 ENCOUNTER — Emergency Department (HOSPITAL_COMMUNITY): Payer: Medicare Other

## 2019-08-19 DIAGNOSIS — E1169 Type 2 diabetes mellitus with other specified complication: Secondary | ICD-10-CM

## 2019-08-19 DIAGNOSIS — I10 Essential (primary) hypertension: Secondary | ICD-10-CM | POA: Diagnosis present

## 2019-08-19 DIAGNOSIS — I739 Peripheral vascular disease, unspecified: Secondary | ICD-10-CM

## 2019-08-19 DIAGNOSIS — Z9841 Cataract extraction status, right eye: Secondary | ICD-10-CM | POA: Diagnosis not present

## 2019-08-19 DIAGNOSIS — Z743 Need for continuous supervision: Secondary | ICD-10-CM | POA: Diagnosis not present

## 2019-08-19 DIAGNOSIS — R000 Tachycardia, unspecified: Secondary | ICD-10-CM | POA: Diagnosis not present

## 2019-08-19 DIAGNOSIS — J449 Chronic obstructive pulmonary disease, unspecified: Secondary | ICD-10-CM | POA: Diagnosis not present

## 2019-08-19 DIAGNOSIS — A4101 Sepsis due to Methicillin susceptible Staphylococcus aureus: Secondary | ICD-10-CM | POA: Diagnosis present

## 2019-08-19 DIAGNOSIS — R823 Hemoglobinuria: Secondary | ICD-10-CM | POA: Diagnosis not present

## 2019-08-19 DIAGNOSIS — Z961 Presence of intraocular lens: Secondary | ICD-10-CM | POA: Diagnosis present

## 2019-08-19 DIAGNOSIS — R279 Unspecified lack of coordination: Secondary | ICD-10-CM | POA: Diagnosis not present

## 2019-08-19 DIAGNOSIS — Z20828 Contact with and (suspected) exposure to other viral communicable diseases: Secondary | ICD-10-CM | POA: Diagnosis present

## 2019-08-19 DIAGNOSIS — N529 Male erectile dysfunction, unspecified: Secondary | ICD-10-CM | POA: Diagnosis present

## 2019-08-19 DIAGNOSIS — M869 Osteomyelitis, unspecified: Secondary | ICD-10-CM

## 2019-08-19 DIAGNOSIS — E43 Unspecified severe protein-calorie malnutrition: Secondary | ICD-10-CM | POA: Diagnosis not present

## 2019-08-19 DIAGNOSIS — R824 Acetonuria: Secondary | ICD-10-CM | POA: Diagnosis present

## 2019-08-19 DIAGNOSIS — M6281 Muscle weakness (generalized): Secondary | ICD-10-CM | POA: Diagnosis not present

## 2019-08-19 DIAGNOSIS — L97509 Non-pressure chronic ulcer of other part of unspecified foot with unspecified severity: Secondary | ICD-10-CM | POA: Diagnosis not present

## 2019-08-19 DIAGNOSIS — Z9842 Cataract extraction status, left eye: Secondary | ICD-10-CM | POA: Diagnosis not present

## 2019-08-19 DIAGNOSIS — K8681 Exocrine pancreatic insufficiency: Secondary | ICD-10-CM | POA: Diagnosis present

## 2019-08-19 DIAGNOSIS — Z01810 Encounter for preprocedural cardiovascular examination: Secondary | ICD-10-CM | POA: Diagnosis not present

## 2019-08-19 DIAGNOSIS — R7302 Impaired glucose tolerance (oral): Secondary | ICD-10-CM | POA: Diagnosis not present

## 2019-08-19 DIAGNOSIS — K861 Other chronic pancreatitis: Secondary | ICD-10-CM | POA: Diagnosis not present

## 2019-08-19 DIAGNOSIS — R7881 Bacteremia: Secondary | ICD-10-CM | POA: Diagnosis not present

## 2019-08-19 DIAGNOSIS — I6529 Occlusion and stenosis of unspecified carotid artery: Secondary | ICD-10-CM | POA: Diagnosis not present

## 2019-08-19 DIAGNOSIS — N200 Calculus of kidney: Secondary | ICD-10-CM | POA: Diagnosis not present

## 2019-08-19 DIAGNOSIS — Z8631 Personal history of diabetic foot ulcer: Secondary | ICD-10-CM | POA: Diagnosis not present

## 2019-08-19 DIAGNOSIS — J9811 Atelectasis: Secondary | ICD-10-CM | POA: Diagnosis not present

## 2019-08-19 DIAGNOSIS — F17210 Nicotine dependence, cigarettes, uncomplicated: Secondary | ICD-10-CM | POA: Diagnosis present

## 2019-08-19 DIAGNOSIS — I96 Gangrene, not elsewhere classified: Secondary | ICD-10-CM | POA: Diagnosis not present

## 2019-08-19 DIAGNOSIS — R6520 Severe sepsis without septic shock: Secondary | ICD-10-CM | POA: Diagnosis not present

## 2019-08-19 DIAGNOSIS — L03115 Cellulitis of right lower limb: Secondary | ICD-10-CM | POA: Diagnosis not present

## 2019-08-19 DIAGNOSIS — Z720 Tobacco use: Secondary | ICD-10-CM | POA: Diagnosis not present

## 2019-08-19 DIAGNOSIS — M86171 Other acute osteomyelitis, right ankle and foot: Secondary | ICD-10-CM

## 2019-08-19 DIAGNOSIS — R319 Hematuria, unspecified: Secondary | ICD-10-CM | POA: Diagnosis present

## 2019-08-19 DIAGNOSIS — Z87442 Personal history of urinary calculi: Secondary | ICD-10-CM

## 2019-08-19 DIAGNOSIS — L089 Local infection of the skin and subcutaneous tissue, unspecified: Secondary | ICD-10-CM | POA: Diagnosis not present

## 2019-08-19 DIAGNOSIS — Z681 Body mass index (BMI) 19 or less, adult: Secondary | ICD-10-CM | POA: Diagnosis not present

## 2019-08-19 DIAGNOSIS — L97519 Non-pressure chronic ulcer of other part of right foot with unspecified severity: Secondary | ICD-10-CM | POA: Diagnosis not present

## 2019-08-19 DIAGNOSIS — E785 Hyperlipidemia, unspecified: Secondary | ICD-10-CM | POA: Diagnosis not present

## 2019-08-19 DIAGNOSIS — R5381 Other malaise: Secondary | ICD-10-CM | POA: Diagnosis not present

## 2019-08-19 DIAGNOSIS — B9561 Methicillin susceptible Staphylococcus aureus infection as the cause of diseases classified elsewhere: Secondary | ICD-10-CM | POA: Diagnosis not present

## 2019-08-19 DIAGNOSIS — Z03818 Encounter for observation for suspected exposure to other biological agents ruled out: Secondary | ICD-10-CM | POA: Diagnosis not present

## 2019-08-19 DIAGNOSIS — E11621 Type 2 diabetes mellitus with foot ulcer: Secondary | ICD-10-CM | POA: Diagnosis not present

## 2019-08-19 DIAGNOSIS — R9431 Abnormal electrocardiogram [ECG] [EKG]: Secondary | ICD-10-CM | POA: Diagnosis not present

## 2019-08-19 DIAGNOSIS — I2510 Atherosclerotic heart disease of native coronary artery without angina pectoris: Secondary | ICD-10-CM | POA: Diagnosis present

## 2019-08-19 DIAGNOSIS — A4901 Methicillin susceptible Staphylococcus aureus infection, unspecified site: Secondary | ICD-10-CM | POA: Diagnosis not present

## 2019-08-19 DIAGNOSIS — I998 Other disorder of circulatory system: Secondary | ICD-10-CM | POA: Diagnosis not present

## 2019-08-19 DIAGNOSIS — E1152 Type 2 diabetes mellitus with diabetic peripheral angiopathy with gangrene: Principal | ICD-10-CM | POA: Diagnosis present

## 2019-08-19 LAB — CBC WITH DIFFERENTIAL/PLATELET
Abs Immature Granulocytes: 0.05 10*3/uL (ref 0.00–0.07)
Basophils Absolute: 0.1 10*3/uL (ref 0.0–0.1)
Basophils Relative: 0 %
Eosinophils Absolute: 0 10*3/uL (ref 0.0–0.5)
Eosinophils Relative: 0 %
HCT: 48.8 % (ref 39.0–52.0)
Hemoglobin: 15.6 g/dL (ref 13.0–17.0)
Immature Granulocytes: 0 %
Lymphocytes Relative: 10 %
Lymphs Abs: 1.3 10*3/uL (ref 0.7–4.0)
MCH: 29 pg (ref 26.0–34.0)
MCHC: 32 g/dL (ref 30.0–36.0)
MCV: 90.7 fL (ref 80.0–100.0)
Monocytes Absolute: 1 10*3/uL (ref 0.1–1.0)
Monocytes Relative: 8 %
Neutro Abs: 10 10*3/uL — ABNORMAL HIGH (ref 1.7–7.7)
Neutrophils Relative %: 82 %
Platelets: 188 10*3/uL (ref 150–400)
RBC: 5.38 MIL/uL (ref 4.22–5.81)
RDW: 14.2 % (ref 11.5–15.5)
WBC: 12.4 10*3/uL — ABNORMAL HIGH (ref 4.0–10.5)
nRBC: 0 % (ref 0.0–0.2)

## 2019-08-19 LAB — URINALYSIS, ROUTINE W REFLEX MICROSCOPIC
Bilirubin Urine: NEGATIVE
Glucose, UA: NEGATIVE mg/dL
Ketones, ur: 5 mg/dL — AB
Leukocytes,Ua: NEGATIVE
Nitrite: NEGATIVE
Protein, ur: NEGATIVE mg/dL
RBC / HPF: >50 RBC/hpf — ABNORMAL HIGH (ref 0–5)
Specific Gravity, Urine: 1.019 (ref 1.005–1.030)
pH: 6 (ref 5.0–8.0)

## 2019-08-19 LAB — PROTIME-INR
INR: 1.1 (ref 0.8–1.2)
Prothrombin Time: 14.5 seconds (ref 11.4–15.2)

## 2019-08-19 LAB — COMPREHENSIVE METABOLIC PANEL
ALT: 7 U/L (ref 0–44)
AST: 10 U/L — ABNORMAL LOW (ref 15–41)
Albumin: 4 g/dL (ref 3.5–5.0)
Alkaline Phosphatase: 96 U/L (ref 38–126)
Anion gap: 11 (ref 5–15)
BUN: 13 mg/dL (ref 8–23)
CO2: 28 mmol/L (ref 22–32)
Calcium: 9.2 mg/dL (ref 8.9–10.3)
Chloride: 99 mmol/L (ref 98–111)
Creatinine, Ser: 0.79 mg/dL (ref 0.61–1.24)
GFR calc Af Amer: >60 mL/min (ref >60)
GFR calc non Af Amer: >60 mL/min (ref >60)
Glucose, Bld: 91 mg/dL (ref 70–99)
Potassium: 3.5 mmol/L (ref 3.5–5.1)
Sodium: 138 mmol/L (ref 135–145)
Total Bilirubin: 0.6 mg/dL (ref 0.3–1.2)
Total Protein: 8.7 g/dL — ABNORMAL HIGH (ref 6.5–8.1)

## 2019-08-19 LAB — SARS CORONAVIRUS 2 BY RT PCR (HOSPITAL ORDER, PERFORMED IN ~~LOC~~ HOSPITAL LAB): SARS Coronavirus 2: NEGATIVE

## 2019-08-19 LAB — APTT: aPTT: 34 seconds (ref 24–36)

## 2019-08-19 LAB — C-REACTIVE PROTEIN: CRP: 19.1 mg/dL — ABNORMAL HIGH (ref <1.0)

## 2019-08-19 LAB — LACTIC ACID, PLASMA
Lactic Acid, Venous: 2.3 mmol/L (ref 0.5–1.9)
Lactic Acid, Venous: 1.9 mmol/L (ref 0.5–1.9)

## 2019-08-19 LAB — SEDIMENTATION RATE: Sed Rate: 24 mm/hr — ABNORMAL HIGH (ref 0–16)

## 2019-08-19 MED ORDER — SODIUM CHLORIDE 0.9 % IV BOLUS
1000.0000 mL | Freq: Once | INTRAVENOUS | Status: AC
  Administered 2019-08-19: 1000 mL via INTRAVENOUS

## 2019-08-19 MED ORDER — SODIUM CHLORIDE 0.9 % IV SOLN
1000.0000 mL | INTRAVENOUS | Status: DC
  Administered 2019-08-19 – 2019-08-20 (×3): 1000 mL via INTRAVENOUS

## 2019-08-19 MED ORDER — ACETAMINOPHEN 325 MG PO TABS
650.0000 mg | ORAL_TABLET | Freq: Four times a day (QID) | ORAL | Status: DC | PRN
  Administered 2019-08-21 – 2019-08-27 (×5): 650 mg via ORAL
  Filled 2019-08-19 (×4): qty 2

## 2019-08-19 MED ORDER — VANCOMYCIN HCL IN DEXTROSE 750-5 MG/150ML-% IV SOLN
750.0000 mg | Freq: Two times a day (BID) | INTRAVENOUS | Status: DC
  Administered 2019-08-20 – 2019-08-21 (×3): 750 mg via INTRAVENOUS
  Filled 2019-08-19 (×4): qty 150

## 2019-08-19 MED ORDER — VANCOMYCIN HCL IN DEXTROSE 1-5 GM/200ML-% IV SOLN
1000.0000 mg | Freq: Once | INTRAVENOUS | Status: AC
  Administered 2019-08-19: 1000 mg via INTRAVENOUS
  Filled 2019-08-19: qty 200

## 2019-08-19 MED ORDER — PIPERACILLIN-TAZOBACTAM 3.375 G IVPB 30 MIN
3.3750 g | Freq: Once | INTRAVENOUS | Status: AC
  Administered 2019-08-19: 3.375 g via INTRAVENOUS
  Filled 2019-08-19: qty 50

## 2019-08-19 MED ORDER — PROCHLORPERAZINE EDISYLATE 10 MG/2ML IJ SOLN
5.0000 mg | INTRAMUSCULAR | Status: DC | PRN
  Filled 2019-08-19: qty 1

## 2019-08-19 MED ORDER — ACETAMINOPHEN 325 MG PO TABS
650.0000 mg | ORAL_TABLET | Freq: Once | ORAL | Status: AC
  Administered 2019-08-19: 650 mg via ORAL
  Filled 2019-08-19: qty 2

## 2019-08-19 MED ORDER — SODIUM CHLORIDE 0.9 % IV SOLN
2.0000 g | Freq: Three times a day (TID) | INTRAVENOUS | Status: DC
  Administered 2019-08-19 – 2019-08-21 (×4): 2 g via INTRAVENOUS
  Filled 2019-08-19 (×8): qty 2

## 2019-08-19 MED ORDER — METRONIDAZOLE 500 MG PO TABS
500.0000 mg | ORAL_TABLET | Freq: Three times a day (TID) | ORAL | Status: DC
  Administered 2019-08-19 – 2019-08-21 (×4): 500 mg via ORAL
  Filled 2019-08-19 (×4): qty 1

## 2019-08-19 MED ORDER — ACETAMINOPHEN 650 MG RE SUPP: 650.0000 mg | Freq: Four times a day (QID) | RECTAL | Status: DC | PRN

## 2019-08-19 MED ORDER — HYDROMORPHONE HCL 1 MG/ML IJ SOLN
0.5000 mg | Freq: Once | INTRAMUSCULAR | Status: AC
  Administered 2019-08-19: 0.5 mg via INTRAVENOUS
  Filled 2019-08-19: qty 1

## 2019-08-19 MED ORDER — ENOXAPARIN SODIUM 40 MG/0.4ML ~~LOC~~ SOLN
40.0000 mg | SUBCUTANEOUS | Status: DC
  Administered 2019-08-19 – 2019-08-29 (×11): 40 mg via SUBCUTANEOUS
  Filled 2019-08-19 (×11): qty 0.4

## 2019-08-19 NOTE — Progress Notes (Signed)
Consult request has been received. CSW attempting to follow up at present time  Mikel Pyon M. Cutberto Winfree LCSWA Transitions of Care  Clinical Social Worker  Ph: [phone] 

## 2019-08-19 NOTE — Consult Note (Signed)
[facility] Nurse wound consult note Reason for Consult:Necrotic toes on right foot, right great toe and 2nd through 4th digits. Please see photographs taken of the extremity in the ED this evening by ED PA Tammy Triplett. Also noted is a necrotic lesion on th medial 1st metatarsal head Wound type:Arterial insufficiency Pressure Injury POA: N/A  The notes indicate that a transfer to [facility] is planned for other consultations and a higher level of care.  These lesions exceed the scope of [facility] nursing.  I recommend consultation with either Vascular or Orthopedic surgery. I have provided bilateral pressure redistribution heel boots for protection of the foot during transfer and after arrival at [facility] and for pressure injury prevention of the heels.  [facility] nursing team will not follow, but will remain available to this patient, the nursing and medical teams.  Please re-consult if needed. Thanks, Maudie Flakes, MSN, RN, [place], Arther Abbott  Pager# [phone]

## 2019-08-19 NOTE — ED Triage Notes (Signed)
Patient with multiple wounds to his R foot. Unable to dopple pulse at pedal or post Tib. Foot is hot and edematous.

## 2019-08-19 NOTE — ED Notes (Signed)
CRITICAL VALUE ALERT  Critical Value:  Lactic acid 2.3  Date & Time Notied:  08/19/2019 1915  Provider Notified: triplett, PA  Orders Received/Actions taken: n/a

## 2019-08-19 NOTE — Progress Notes (Signed)
Pharmacy Antibiotic Note  Raymond Walker. is a 68 y.o. male admitted on 08/19/2019 with diabetic foot infection.  Pharmacy has been consulted for Vancomycin and cefepime dosing.  Plan: Vancomycin 1000mg  loading dose given in ED, then 750mg  IV every 12 hours.  Goal trough 15-20 mcg/mL.  Cefepime 2gm IV q8h Also on, flagyl 500mg  IV q8h F/U cxs and clinical progress Monitor V/S, labs and levels as indicated  Height: 6' (182.9 cm) Weight: 140 lb (63.5 kg) IBW/kg (Calculated) : 77.6  Temp (24hrs), Avg:101.4 F (38.6 C), Min:100 F (37.8 C), Max:102.8 F (39.3 C)  Recent Labs  Lab 08/19/19 1827 08/19/19 1954  WBC 12.4*  --   CREATININE 0.79  --   LATICACIDVEN 2.3* 1.9    Estimated Creatinine Clearance: 78.3 mL/min (by C-G formula based on SCr of 0.79 mg/dL).    No Known Allergies  Antimicrobials this admission: Vancomycin 9/2>>  Cefepime 9/2 >>  Flagyl 9/2 >>  Zosyn 3.375gm IV x 1 dose given in ED 9/2  Dose adjustments this admission: vanc/cefepime  Microbiology results: 9/2 BCx: pending 9/2 UCx: pending    Thank you for allowing pharmacy to be a part of this patient's care.  Isac Sarna, BS [place], [place] Clinical Pharmacist Pager [phone] 08/19/2019 9:45 PM

## 2019-08-19 NOTE — ED Provider Notes (Signed)
[facility] EMERGENCY DEPARTMENT Provider Note   CSN: [id number] Arrival date & time: 08/19/19  1727     History   Chief Complaint Chief Complaint  Patient presents with  . Wound Infection    HPI Raymond Walker. is a 68 y.o. male.     HPI   Raymond Walker. is a 68 y.o. male who presents to the Emergency Department complaining of pain, swelling and discoloration to the toes of the right foot.  He states that he noticed the toenails of his right foot began "falling off" about one month ago.  He reports continuing redness and swelling of the foot with developing open wounds to the tips of his right great, second, third, and fourth toes he states the tips have been "black" for some time and he reports decreased sensation of his toes.  Several days ago, he noticed increasing redness and a red streak radiating up from his toes to the level of his ankle.  He endorses pain with palpation and with weightbearing.  He states that he lives alone and has to care for himself.  No known injury.  He does not have a PCP currently.  He denies fever, chills, chest pain, shortness of breath.  Last TD is unknown.  He is a current smoker.    Past Medical History:  Diagnosis Date  . COPD (chronic obstructive pulmonary disease) ([place])   . Erectile dysfunction   . Impaired glucose tolerance   . Nephrolithiasis   . Pancreatitis, recurrent   . Thrombocytopenia ([place]) 08/10/2018   Staying in the low 100s will follow closely    Patient Active Problem List   Diagnosis Date Noted  . Thrombocytopenia ([place]) 08/10/2018  . Aortic atherosclerosis ([place]) 10/14/2016  . Coronary artery calcification seen on CAT scan 10/14/2016  . Other emphysema ([place]) 10/14/2016  . Elevated blood pressure 12/03/2011  . Chest pain 12/03/2011  . Tobacco abuse 12/03/2011  . Right leg pain 12/03/2011    Past Surgical History:  Procedure Laterality Date  . CATARACT EXTRACTION W/PHACO Right 05/02/2018   Procedure: CATARACT  EXTRACTION PHACO AND INTRAOCULAR LENS PLACEMENT (IOC);  Surgeon: Baruch Goldmann, MD;  Location: [facility];  Service: Ophthalmology;  Laterality: Right;  CDE: 11.11  . CATARACT EXTRACTION W/PHACO Left 07/04/2018   Procedure: CATARACT EXTRACTION PHACO AND INTRAOCULAR LENS PLACEMENT (IOC);  Surgeon: Baruch Goldmann, MD;  Location: [facility];  Service: Ophthalmology;  Laterality: Left;  CDE: 7.15  . CHOLECYSTECTOMY    . ORCHIECTOMY    . VASECTOMY        Home Medications    Prior to Admission medications   Medication Sig Start Date End Date Taking? Authorizing Provider  acetaminophen (TYLENOL) 500 MG tablet Take 500 mg by mouth every 8 (eight) hours as needed for moderate pain.     [provider]    Family History Family History  Problem Relation Age of Onset  . Hypertension Father   . Coronary artery disease Father   . Coronary artery disease Mother   . Cancer Mother        Breast    Social History Social History   Tobacco Use  . Smoking status: Current Every Day Smoker    Packs/day: 1.50    Types: Cigarettes  . Smokeless tobacco: Never Used  Substance Use Topics  . Alcohol use: No  . Drug use: No     Allergies   Patient has no known allergies.   Review of Systems Review  of Systems  Constitutional: Negative for chills and fever.  Respiratory: Negative for shortness of breath and wheezing.   Cardiovascular: Negative for chest pain.  Gastrointestinal: Negative for nausea and vomiting.  Genitourinary: Negative for decreased urine volume and dysuria.  Musculoskeletal: Positive for arthralgias (Pain, redness, and swelling of the right foot and toes.) and joint swelling.  Skin: Positive for color change and wound.  Neurological: Negative for dizziness, weakness and numbness.  Psychiatric/Behavioral: Negative for confusion.     Physical Exam Updated Vital Signs BP (!) 147/90   Pulse (!) 102   Temp (!) 102.8 F (39.3 C) (Oral)   Resp (!) 26   Ht 6' (1.829 m)    Wt 63.5 kg   SpO2 99%   BMI 18.99 kg/m   Physical Exam Vitals signs and nursing note reviewed.  Constitutional:      General: He is not in acute distress.    Appearance: He is well-developed. He is ill-appearing.     Comments: Frail and elderly-appearing  HENT:     Mouth/Throat:     Mouth: Mucous membranes are moist.  Cardiovascular:     Rate and Rhythm: Regular rhythm. Tachycardia present.     Comments: Unable to palpation or hear DP with portable doppler, PT pulse heard with doppler.  Extremity is warm, but dusky.  DP and PT pulse palpable in left foot.  Pulmonary:     Effort: Pulmonary effort is normal.     Breath sounds: Normal breath sounds.  Musculoskeletal:        General: Swelling and tenderness present.     Comments: Necrotic wounds to the distal tips of the great, second, third and fourth toes.  Dry open wound to the medial aspect of the first MT head.  Lymphangitis present.  Sensation of the toes is diminished.  See photos below  Skin:    General: Skin is warm.     Capillary Refill: Capillary refill takes 2 to 3 seconds.     Findings: Erythema present.  Neurological:     Mental Status: He is alert and oriented to person, place, and time.     GCS: GCS eye subscore is 4. GCS verbal subscore is 5. GCS motor subscore is 6.     Sensory: No sensory deficit.     Motor: No weakness or abnormal muscle tone.            ED Treatments / Results  Labs (all labs ordered are listed, but only abnormal results are displayed) Labs Reviewed  LACTIC ACID, PLASMA - Abnormal; Notable for the following components:      Result Value   Lactic Acid, Venous 2.3 (*)    All other components within normal limits  COMPREHENSIVE METABOLIC PANEL - Abnormal; Notable for the following components:   Total Protein 8.7 (*)    AST 10 (*)    All other components within normal limits  CBC WITH DIFFERENTIAL/PLATELET - Abnormal; Notable for the following components:   WBC 12.4 (*)    Neutro  Abs 10.0 (*)    All other components within normal limits  URINALYSIS, ROUTINE W REFLEX MICROSCOPIC - Abnormal; Notable for the following components:   Hgb urine dipstick LARGE (*)    Ketones, ur 5 (*)    RBC / HPF >50 (*)    Bacteria, UA RARE (*)    All other components within normal limits  SARS CORONAVIRUS 2 (HOSPITAL ORDER, [facility])  CULTURE, BLOOD (ROUTINE X  2)  CULTURE, BLOOD (ROUTINE X 2)  URINE CULTURE  LACTIC ACID, PLASMA  APTT  PROTIME-INR  HEMOGLOBIN A1C  HIV ANTIBODY (ROUTINE TESTING W REFLEX)  SEDIMENTATION RATE  C-REACTIVE PROTEIN  PREALBUMIN  CBC WITH DIFFERENTIAL/PLATELET  BASIC METABOLIC PANEL    EKG None  Radiology Dg Tibia/fibula Right  Result Date: 08/19/2019 CLINICAL DATA:  Soft tissue infection EXAM: RIGHT TIBIA AND FIBULA - 2 VIEW COMPARISON:  None. FINDINGS: There is no acute displaced fracture. No dislocation. The soft tissues are grossly unremarkable. There may be an intra-articular loose body in the anterior joint space of the knee. IMPRESSION: Negative. Electronically Signed   By: Constance Holster M.D.   On: 08/19/2019 19:31   Dg Chest Port 1 View  Result Date: 08/19/2019 CLINICAL DATA:  Foot infection EXAM: PORTABLE CHEST 1 VIEW COMPARISON:  Chest x-ray dated 10/10/2015 FINDINGS: There are chronic lung changes bilaterally with likely underlying emphysematous changes and scattered areas of scarring and atelectasis. There is no pneumothorax. No large pleural effusion. The heart size is stable. The aorta may be slightly dilated but is otherwise unremarkable. The lungs appear hyperexpanded. There is no acute osseous abnormality detected on this exam. There is a rounded density projecting over the left mid lung zone which is stable from prior study and is favored to represent a nipple shadow IMPRESSION: No active disease. Electronically Signed   By: Constance Holster M.D.   On: 08/19/2019 19:25   Dg Foot Complete Right   Result Date: 08/19/2019 CLINICAL DATA:  Foot infection. EXAM: RIGHT FOOT COMPLETE - 3+ VIEW COMPARISON:  None. FINDINGS: There is osseous destruction of the distal phalanx of the first digit with an associated soft tissue defect. There is destruction or amputation of the distal phalanx of the second digit. There appears to be cortical erosions involving the middle phalanx of the second digit. Multiple ulcerations are noted along the medial aspect of the foot. There is a plantar calcaneal spur. There are degenerative changes of the midfoot. IMPRESSION: 1. Findings concerning for osteomyelitis involving the distal phalanx of the first digit. 2. Possible osteomyelitis involving the middle phalanx of the second digit. The majority of the distal phalanx of the second digit is absent which may be postsurgical or post infectious in etiology. 3. Multiple soft tissue ulcers are noted involving the medial aspect of the foot. Electronically Signed   By: Constance Holster M.D.   On: 08/19/2019 19:29    Procedures Procedures (including critical care time)  Medications Ordered in ED Medications  0.9 %  sodium chloride infusion (0 mLs Intravenous Stopped 08/19/19 2129)  metroNIDAZOLE (FLAGYL) tablet 500 mg (has no administration in time range)  enoxaparin (LOVENOX) injection 40 mg (has no administration in time range)  acetaminophen (TYLENOL) tablet 650 mg (has no administration in time range)    Or  acetaminophen (TYLENOL) suppository 650 mg (has no administration in time range)  prochlorperazine (COMPAZINE) injection 5 mg (has no administration in time range)  vancomycin (VANCOCIN) IVPB 1000 mg/200 mL premix (0 mg Intravenous Stopped 08/19/19 2128)  piperacillin-tazobactam (ZOSYN) IVPB 3.375 g (0 g Intravenous Stopped 08/19/19 2128)  sodium chloride 0.9 % bolus 1,000 mL (0 mLs Intravenous Stopped 08/19/19 2129)  acetaminophen (TYLENOL) tablet 650 mg (650 mg Oral Given 08/19/19 2020)  HYDROmorphone (DILAUDID) injection  0.5 mg (0.5 mg Intravenous Given 08/19/19 2100)     Initial Impression / Assessment and Plan / ED Course  I have reviewed the triage vital signs and the nursing notes.  Pertinent labs & imaging results that were available during my care of the patient were reviewed by me and considered in my medical decision making (see chart for details).   Patient with necrotic toes of the right foot.  PT pulse hear with Doppler, but unable to hear DP pulse.  Diminished sensation of the toes.  X-ray show osteomyelitis of the first and second toes.  Pt is tachycardic and mildly febrile.  Will initiate code sepsis    Clinical Course as of Aug 18 1841  Wed Sep 02, 818  [id number] 68 year old male complaining of sores on his right foot that been going on for months but started with acute pain a few days ago.  He is got signs of gangrene on that foot and he does not have any Doppler signal below the popliteal.  He is febrile and tachycardic tachypneic although awake and alert.  He is getting IV access lab work antibiotics and will likely need an amputation.   [MB]    Clinical Course User Index [MB] Hayden Rasmussen, MD        CRITICAL CARE Performed by: Cleophus Mendonsa Total critical care time: 30 minutes Critical care time was exclusive of separately billable procedures and treating other patients. Critical care was necessary to treat or prevent imminent or life-threatening deterioration. Critical care was time spent personally by me on the following activities: development of treatment plan with patient and/or surrogate as well as nursing, discussions with consultants, evaluation of patient's response to treatment, examination of patient, obtaining history from patient or surrogate, ordering and performing treatments and interventions, ordering and review of laboratory studies, ordering and review of radiographic studies, pulse oximetry and re-evaluation of patient's condition.   2000 consulted gen surgery  about this patient during a consult for another pt. Recommended transfer to [facility] for higher level of care.   2011  Consulted hospitalist and discussed.  Will arrange admission and transfer to [facility].   Final Clinical Impressions(s) / ED Diagnoses   Final diagnoses:  Cellulitis of right lower extremity  Acute osteomyelitis of toe of right foot [facility])    ED Discharge Orders    None       Bufford Lope 08/19/19 2209    Hayden Rasmussen, MD 08/20/19 [phone]

## 2019-08-19 NOTE — H&P (Signed)
History and Physical    Raymond Walker. KGY:[id number] DOB: 1951/01/27 DOA: 08/19/2019  PCP: [name], MD   Patient coming from: Home.  I have personally briefly reviewed patient's old medical records in [place]  Chief Complaint: Right foot wound.  HPI: Raymond Walker. is a 68 y.o. male with medical history significant of COPD, erectile dysfunction, glucose intolerance, history of nephrolithiasis, recurrent pancreatitis, history of thrombocytopenia, current smoker who is coming to the emergency department due to 2 to 3-day exacerbation of chronic right foot toes ulcers that have been present for several months.  Right foot is more edematous, erythematous and mildly tender in recent days.  He states that he probably did initial trauma when coating his toenails a few months ago.  He denies fever, chills, sore throat, rhinorrhea, wheezing or hemoptysis.  No chest pain, palpitations, dizziness, diaphoresis, PND, orthopnea or pitting edema of the lower extremities.  Denies abdominal pain, nausea or vomiting, diarrhea, constipation, melena or hematochezia.  No dysuria, frequency or hematuria.  Denies blurred vision or polyphagia, but states that sometimes he gets some polydipsia and polyuria.  ED Course: Initial vital signs temperature 100 F, then increased to 102.8 F about 2 hours later.  His initial pulse 111, respirations 22, blood pressure 170/97 mmHg and O2 sat 96% on room air.  His urinalysis showed large hemoglobinuria, ketonuria 5 mg/dL.  Microscopic shows more than 50 RBC per hpf and rare bacteria.  The patient was febrile when urinalysis was collected.  Lactic acid was initially 2.3 then 1.9 mmol/L.  White count 12.4 with 82% neutrophils, lymphocytes 10% and monocytes 8%.  Hemoglobin 15.6 g/dL and platelets 188.  PT/INR/PTT are within normal limits.  CMP shows a total protein of 8.7 g/dL and decreased AST at 10 units/L.  All other values are within normal limits.  Imaging:  His chest radiograph did not have any active disease.  X-rays of right tibia and fibula were negative.  However, his right foot have findings concerning for osteomyelitis involving the distal phalanx of the first digit.  There is possible osteomyelitis involving the middle phalanx and second digit.  The majority of the distal phalanx of the second digit is absent which may be postsurgical or post infections in etiology.  There is multiple soft tissue ulcers involving the medial aspect of the foot.  Review of Systems: As per HPI otherwise 10 point review of systems negative.   Past Medical History:  Diagnosis Date   COPD (chronic obstructive pulmonary disease) (HCC)    Erectile dysfunction    Impaired glucose tolerance    Nephrolithiasis    Pancreatitis, recurrent    Thrombocytopenia ([place]) 08/10/2018   Staying in the low 100s will follow closely    Past Surgical History:  Procedure Laterality Date   CATARACT EXTRACTION W/PHACO Right 05/02/2018   Procedure: CATARACT EXTRACTION PHACO AND INTRAOCULAR LENS PLACEMENT ([place]);  Surgeon: Baruch Goldmann, MD;  Location: [facility];  Service: Ophthalmology;  Laterality: Right;  CDE: 11.11   CATARACT EXTRACTION W/PHACO Left 07/04/2018   Procedure: CATARACT EXTRACTION PHACO AND INTRAOCULAR LENS PLACEMENT (IOC);  Surgeon: Baruch Goldmann, MD;  Location: [facility];  Service: Ophthalmology;  Laterality: Left;  CDE: 7.15   CHOLECYSTECTOMY     ORCHIECTOMY     VASECTOMY       reports that he has been smoking cigarettes. He has been smoking about 1.50 packs per day. He has never used smokeless tobacco. He reports that he does not  drink alcohol or use drugs.  No Known Allergies  Family History  Problem Relation Age of Onset   Hypertension Father    Coronary artery disease Father    Coronary artery disease Mother    Cancer Mother        Breast   Prior to Admission medications   Medication Sig Start Date End Date Taking? Authorizing Provider   acetaminophen (TYLENOL) 500 MG tablet Take 500 mg by mouth every 8 (eight) hours as needed for moderate pain.    Yes [provider]    Physical Exam: Vitals:   08/19/19 2015 08/19/19 2030 08/19/19 2100 08/19/19 2130  BP:  (!) 149/76 (!) 147/90   Pulse: (!) 102     Resp: 13 (!) 26    Temp:      TempSrc:      SpO2: 99%     Weight:    63.5 kg  Height:    6' (1.829 m)    Constitutional: NAD, calm, comfortable Eyes: PERRL, lids and conjunctivae normal ENMT: Mucous membranes are moist. Posterior pharynx clear of any exudate or lesions. Neck: normal, supple, no masses, no thyromegaly Respiratory: Clear to auscultation bilaterally, no wheezing, no crackles. Normal respiratory effort. No accessory muscle use.  Cardiovascular: Regular rate and rhythm, no murmurs / rubs / gallops. No extremity edema. 2+ pedal pulses. No carotid bruits.  Abdomen: Nondistended.  Bowel sounds positive.  Soft, no tenderness, no masses palpated. No hepatosplenomegaly. Musculoskeletal: no clubbing / cyanosis.  Multiple gangrenous right foot toes with decrease in ROM.  No contractures. Normal muscle tone.  Skin: Multiple right foot ulcers.  Positive erythema, edema, calor and mild tenderness on right foot area.  Please see pictures below Neurologic: CN 2-12 grossly intact.  Decreased sensation on lower extremities distally, DTR normal. Strength 5/5 in all 4.  Psychiatric: Normal judgment and insight. Alert and oriented x 3. Normal mood.   Labs on Admission: I have personally reviewed following labs and imaging studies     CBC: Recent Labs  Lab 08/19/19 1827  WBC 12.4*  NEUTROABS 10.0*  HGB 15.6  HCT 48.8  MCV 90.7  PLT 660   Basic Metabolic Panel: Recent Labs  Lab 08/19/19 1827  NA 138  K 3.5  CL 99  CO2 28  GLUCOSE 91  BUN 13  CREATININE 0.79  CALCIUM 9.2   GFR: Estimated Creatinine Clearance: 78.3 mL/min (by C-G formula based on SCr of 0.79 mg/dL). Liver Function  Tests: Recent Labs  Lab 08/19/19 1827  AST 10*  ALT 7  ALKPHOS 96  BILITOT 0.6  PROT 8.7*  ALBUMIN 4.0   No results for input(s): LIPASE, AMYLASE in the last 168 hours. No results for input(s): AMMONIA in the last 168 hours. Coagulation Profile: Recent Labs  Lab 08/19/19 1827  INR 1.1   Cardiac Enzymes: No results for input(s): CKTOTAL, CKMB, CKMBINDEX, TROPONINI in the last 168 hours. BNP (last 3 results) No results for input(s): PROBNP in the last 8760 hours. HbA1C: No results for input(s): HGBA1C in the last 72 hours. CBG: No results for input(s): GLUCAP in the last 168 hours. Lipid Profile: No results for input(s): CHOL, HDL, LDLCALC, TRIG, CHOLHDL, LDLDIRECT in the last 72 hours. Thyroid Function Tests: No results for input(s): TSH, T4TOTAL, FREET4, T3FREE, THYROIDAB in the last 72 hours. Anemia Panel: No results for input(s): VITAMINB12, FOLATE, FERRITIN, TIBC, IRON, RETICCTPCT in the last 72 hours. Urine analysis:    Component Value Date/Time   COLORURINE YELLOW  08/19/2019 [place] 08/19/2019 2000   LABSPEC 1.019 08/19/2019 2000   PHURINE 6.0 08/19/2019 2000   GLUCOSEU NEGATIVE 08/19/2019 2000   HGBUR LARGE (A) 08/19/2019 2000   BILIRUBINUR NEGATIVE 08/19/2019 2000   KETONESUR 5 (A) 08/19/2019 2000   PROTEINUR NEGATIVE 08/19/2019 2000   NITRITE NEGATIVE 08/19/2019 2000   LEUKOCYTESUR NEGATIVE 08/19/2019 2000    Radiological Exams on Admission: Dg Tibia/fibula Right  Result Date: 08/19/2019 CLINICAL DATA:  Soft tissue infection EXAM: RIGHT TIBIA AND FIBULA - 2 VIEW COMPARISON:  None. FINDINGS: There is no acute displaced fracture. No dislocation. The soft tissues are grossly unremarkable. There may be an intra-articular loose body in the anterior joint space of the knee. IMPRESSION: Negative. Electronically Signed   By: Constance Holster M.D.   On: 08/19/2019 19:31   Dg Chest Port 1 View  Result Date: 08/19/2019 CLINICAL DATA:  Foot  infection EXAM: PORTABLE CHEST 1 VIEW COMPARISON:  Chest x-ray dated 10/10/2015 FINDINGS: There are chronic lung changes bilaterally with likely underlying emphysematous changes and scattered areas of scarring and atelectasis. There is no pneumothorax. No large pleural effusion. The heart size is stable. The aorta may be slightly dilated but is otherwise unremarkable. The lungs appear hyperexpanded. There is no acute osseous abnormality detected on this exam. There is a rounded density projecting over the left mid lung zone which is stable from prior study and is favored to represent a nipple shadow IMPRESSION: No active disease. Electronically Signed   By: Constance Holster M.D.   On: 08/19/2019 19:25   Dg Foot Complete Right  Result Date: 08/19/2019 CLINICAL DATA:  Foot infection. EXAM: RIGHT FOOT COMPLETE - 3+ VIEW COMPARISON:  None. FINDINGS: There is osseous destruction of the distal phalanx of the first digit with an associated soft tissue defect. There is destruction or amputation of the distal phalanx of the second digit. There appears to be cortical erosions involving the middle phalanx of the second digit. Multiple ulcerations are noted along the medial aspect of the foot. There is a plantar calcaneal spur. There are degenerative changes of the midfoot. IMPRESSION: 1. Findings concerning for osteomyelitis involving the distal phalanx of the first digit. 2. Possible osteomyelitis involving the middle phalanx of the second digit. The majority of the distal phalanx of the second digit is absent which may be postsurgical or post infectious in etiology. 3. Multiple soft tissue ulcers are noted involving the medial aspect of the foot. Electronically Signed   By: Constance Holster M.D.   On: 08/19/2019 19:29    EKG: Independently reviewed.  Vent. rate 114 BPM PR interval * ms QRS duration 81 ms QT/QTc 317/437 ms P-R-T axes 93 75 85 Sinus tachycardia Atrial premature complexes Consider left atrial  enlargement  Assessment/Plan Principal Problem:   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[place]) The patient has glucose intolerance and very likely has PAD. Case discussed with general surgery who recommended [facility] transfer. Continue IV antibiotics cefepime and vancomycin. Metronidazole 500 mg every 8 hours p.o. Check CRP, ESR, prealbumin and HIV test. Check MRI of right foot.  Active Problems:   Tobacco abuse The patient was encouraged to quit smoking. Nicotine replacement therapy order.    Coronary artery calcification seen on CAT scan Check echocardiogram. Start low-dose metoprolol.    Impaired glucose tolerance Carbohydrate modified diet. Check hemoglobin A1c. CBG monitoring before meals.    COPD (chronic obstructive pulmonary disease) (HCC) No signs of present decompensation. Smoking cessation advised. Supplemental oxygen as  needed. Bronchodilators as needed.    DVT prophylaxis: Lovenox SQ. Code Status: Full code. Family Communication: Disposition Plan: Admit to [facility] for IV antibiotic therapy, further work-up and vascular/orthopedic surgery evaluation. Consults called: Admission status: Inpatient/telemetry.   Reubin Milan MD [facility]  If 7PM-7AM, please contact night-coverage [email or web address]  08/19/2019, 10:21 PM   This document was prepared using Dragon voice recognition software and may contain some unintended transcription errors.

## 2019-08-20 ENCOUNTER — Ambulatory Visit (HOSPITAL_COMMUNITY): Admit: 2019-08-20 | Payer: Medicare Other | Admitting: Vascular Surgery

## 2019-08-20 ENCOUNTER — Inpatient Hospital Stay (HOSPITAL_COMMUNITY): Payer: Medicare Other

## 2019-08-20 ENCOUNTER — Encounter (HOSPITAL_COMMUNITY)
Admission: EM | Disposition: A | Discharge status: Skilled Nursing Facility | Payer: Self-pay | Source: Home / Self Care | Attending: Internal Medicine

## 2019-08-20 DIAGNOSIS — J449 Chronic obstructive pulmonary disease, unspecified: Secondary | ICD-10-CM

## 2019-08-20 DIAGNOSIS — R9431 Abnormal electrocardiogram [ECG] [EKG]: Secondary | ICD-10-CM

## 2019-08-20 DIAGNOSIS — L03115 Cellulitis of right lower limb: Secondary | ICD-10-CM

## 2019-08-20 DIAGNOSIS — M86171 Other acute osteomyelitis, right ankle and foot: Secondary | ICD-10-CM

## 2019-08-20 HISTORY — PX: ABDOMINAL AORTOGRAM W/LOWER EXTREMITY: CATH118223

## 2019-08-20 HISTORY — PX: PERIPHERAL VASCULAR INTERVENTION: CATH118257

## 2019-08-20 LAB — ECHOCARDIOGRAM COMPLETE
Height: 72 in
Weight: 2243.4 oz

## 2019-08-20 LAB — BLOOD CULTURE ID PANEL (REFLEXED)

## 2019-08-20 LAB — URINALYSIS, ROUTINE W REFLEX MICROSCOPIC
Bacteria, UA: NONE SEEN
Bilirubin Urine: NEGATIVE
Glucose, UA: NEGATIVE mg/dL
Ketones, ur: 20 mg/dL — AB
Leukocytes,Ua: NEGATIVE
Nitrite: NEGATIVE
Protein, ur: NEGATIVE mg/dL
Specific Gravity, Urine: 1.014 (ref 1.005–1.030)
pH: 6 (ref 5.0–8.0)

## 2019-08-20 LAB — GLUCOSE, CAPILLARY
Glucose-Capillary: 111 mg/dL — ABNORMAL HIGH (ref 70–99)
Glucose-Capillary: 148 mg/dL — ABNORMAL HIGH (ref 70–99)
Glucose-Capillary: 89 mg/dL (ref 70–99)
Glucose-Capillary: 99 mg/dL (ref 70–99)

## 2019-08-20 LAB — HEMOGLOBIN A1C
Hgb A1c MFr Bld: 5.9 % — ABNORMAL HIGH (ref 4.8–5.6)
Mean Plasma Glucose: 122.63 mg/dL

## 2019-08-20 LAB — PREALBUMIN: Prealbumin: 13.7 mg/dL — ABNORMAL LOW (ref 18–38)

## 2019-08-20 LAB — POCT ACTIVATED CLOTTING TIME: Activated Clotting Time: 180 seconds

## 2019-08-20 SURGERY — ABDOMINAL AORTOGRAM W/LOWER EXTREMITY
Anesthesia: LOCAL | Laterality: Left

## 2019-08-20 MED ORDER — ASPIRIN EC 81 MG PO TBEC
81.0000 mg | DELAYED_RELEASE_TABLET | Freq: Every day | ORAL | Status: DC
  Administered 2019-08-20 – 2019-08-29 (×10): 81 mg via ORAL
  Filled 2019-08-20 (×10): qty 1

## 2019-08-20 MED ORDER — METOPROLOL SUCCINATE ER 25 MG PO TB24
25.0000 mg | ORAL_TABLET | Freq: Every day | ORAL | Status: DC
  Administered 2019-08-20: 08:00:00 25 mg via ORAL
  Filled 2019-08-20: qty 1

## 2019-08-20 MED ORDER — MORPHINE SULFATE (PF) 2 MG/ML IV SOLN
INTRAVENOUS | Status: AC
  Filled 2019-08-20: qty 1

## 2019-08-20 MED ORDER — SODIUM CHLORIDE 0.9 % IV SOLN: 250.0000 mL | INTRAVENOUS | Status: DC | PRN

## 2019-08-20 MED ORDER — HEPARIN (PORCINE) IN NACL 1000-0.9 UT/500ML-% IV SOLN
INTRAVENOUS | Status: DC | PRN
  Administered 2019-08-20 (×2): 500 mL

## 2019-08-20 MED ORDER — IODIXANOL 320 MG/ML IV SOLN
INTRAVENOUS | Status: DC | PRN
  Administered 2019-08-20: 155 mL

## 2019-08-20 MED ORDER — ONDANSETRON HCL 4 MG/2ML IJ SOLN
4.0000 mg | Freq: Four times a day (QID) | INTRAMUSCULAR | Status: DC | PRN
  Administered 2019-08-22: 4 mg via INTRAVENOUS
  Filled 2019-08-20: qty 2

## 2019-08-20 MED ORDER — MIDAZOLAM HCL 2 MG/2ML IJ SOLN
INTRAMUSCULAR | Status: AC
  Filled 2019-08-20: qty 2

## 2019-08-20 MED ORDER — CLOPIDOGREL BISULFATE 75 MG PO TABS
75.0000 mg | ORAL_TABLET | Freq: Every day | ORAL | Status: DC
  Administered 2019-08-21 – 2019-08-29 (×9): 75 mg via ORAL
  Filled 2019-08-20 (×9): qty 1

## 2019-08-20 MED ORDER — ROSUVASTATIN CALCIUM 5 MG PO TABS
10.0000 mg | ORAL_TABLET | Freq: Every day | ORAL | Status: DC
  Administered 2019-08-20 – 2019-08-29 (×10): 10 mg via ORAL
  Filled 2019-08-20 (×10): qty 2

## 2019-08-20 MED ORDER — LABETALOL HCL 5 MG/ML IV SOLN
10.0000 mg | INTRAVENOUS | Status: DC | PRN
  Administered 2019-08-20: 10 mg via INTRAVENOUS
  Filled 2019-08-20: qty 4

## 2019-08-20 MED ORDER — SODIUM CHLORIDE 0.9 % IV SOLN
INTRAVENOUS | Status: AC
  Administered 2019-08-20: 18:00:00 via INTRAVENOUS

## 2019-08-20 MED ORDER — LIDOCAINE HCL (PF) 1 % IJ SOLN
INTRAMUSCULAR | Status: AC
  Filled 2019-08-20: qty 30

## 2019-08-20 MED ORDER — ACETAMINOPHEN 325 MG PO TABS
650.0000 mg | ORAL_TABLET | ORAL | Status: DC | PRN
  Filled 2019-08-20: qty 2

## 2019-08-20 MED ORDER — HYDRALAZINE HCL 20 MG/ML IJ SOLN: 5.0000 mg | INTRAMUSCULAR | Status: DC | PRN

## 2019-08-20 MED ORDER — LABETALOL HCL 5 MG/ML IV SOLN
INTRAVENOUS | Status: AC
  Filled 2019-08-20: qty 4

## 2019-08-20 MED ORDER — MORPHINE SULFATE (PF) 2 MG/ML IV SOLN
2.0000 mg | Freq: Once | INTRAVENOUS | Status: AC
  Administered 2019-08-20: 2 mg via INTRAVENOUS
  Filled 2019-08-20: qty 1

## 2019-08-20 MED ORDER — HEPARIN SODIUM (PORCINE) 1000 UNIT/ML IJ SOLN
INTRAMUSCULAR | Status: DC | PRN
  Administered 2019-08-20: 7000 [IU] via INTRAVENOUS

## 2019-08-20 MED ORDER — CLOPIDOGREL BISULFATE 75 MG PO TABS: 300.0000 mg | ORAL_TABLET | Freq: Once | ORAL | Status: AC

## 2019-08-20 MED ORDER — HEPARIN (PORCINE) IN NACL 1000-0.9 UT/500ML-% IV SOLN
INTRAVENOUS | Status: AC
  Filled 2019-08-20: qty 1000

## 2019-08-20 MED ORDER — MIDAZOLAM HCL 2 MG/2ML IJ SOLN
INTRAMUSCULAR | Status: DC | PRN
  Administered 2019-08-20: 1 mg via INTRAVENOUS

## 2019-08-20 MED ORDER — FENTANYL CITRATE (PF) 100 MCG/2ML IJ SOLN
INTRAMUSCULAR | Status: DC | PRN
  Administered 2019-08-20: 25 ug via INTRAVENOUS

## 2019-08-20 MED ORDER — SODIUM CHLORIDE 0.9 % IV SOLN
INTRAVENOUS | Status: DC | PRN
  Administered 2019-08-20: 250 mL via INTRAVENOUS

## 2019-08-20 MED ORDER — FENTANYL CITRATE (PF) 100 MCG/2ML IJ SOLN
INTRAMUSCULAR | Status: AC
  Filled 2019-08-20: qty 2

## 2019-08-20 MED ORDER — MORPHINE SULFATE (PF) 2 MG/ML IV SOLN
2.0000 mg | INTRAVENOUS | Status: DC | PRN
  Administered 2019-08-20: 2 mg via INTRAVENOUS
  Administered 2019-08-20: 4 mg via INTRAVENOUS
  Administered 2019-08-20 – 2019-08-24 (×9): 2 mg via INTRAVENOUS
  Administered 2019-08-25 (×2): 4 mg via INTRAVENOUS
  Administered 2019-08-26: 2 mg via INTRAVENOUS
  Administered 2019-08-26: 4 mg via INTRAVENOUS
  Administered 2019-08-27 (×2): 2 mg via INTRAVENOUS
  Filled 2019-08-20 (×4): qty 1
  Filled 2019-08-20 (×2): qty 2
  Filled 2019-08-20 (×4): qty 1
  Filled 2019-08-20: qty 2
  Filled 2019-08-20 (×5): qty 1
  Filled 2019-08-20: qty 2
  Filled 2019-08-20: qty 1

## 2019-08-20 MED ORDER — LIDOCAINE HCL (PF) 1 % IJ SOLN
INTRAMUSCULAR | Status: DC | PRN
  Administered 2019-08-20: 13 mL
  Administered 2019-08-20: 14 mL

## 2019-08-20 MED ORDER — OXYCODONE HCL 5 MG PO TABS
5.0000 mg | ORAL_TABLET | ORAL | Status: DC | PRN
  Administered 2019-08-20 – 2019-08-29 (×37): 5 mg via ORAL
  Filled 2019-08-20 (×37): qty 1

## 2019-08-20 MED ORDER — SODIUM CHLORIDE 0.9% FLUSH
3.0000 mL | INTRAVENOUS | Status: DC | PRN
  Administered 2019-08-23: 3 mL via INTRAVENOUS
  Filled 2019-08-20: qty 3

## 2019-08-20 MED ORDER — NICOTINE 21 MG/24HR TD PT24
21.0000 mg | MEDICATED_PATCH | Freq: Every day | TRANSDERMAL | Status: DC | PRN
  Filled 2019-08-20: qty 1

## 2019-08-20 MED ORDER — SODIUM CHLORIDE 0.9% FLUSH
3.0000 mL | Freq: Two times a day (BID) | INTRAVENOUS | Status: DC
  Administered 2019-08-20 – 2019-08-28 (×10): 3 mL via INTRAVENOUS

## 2019-08-20 SURGICAL SUPPLY — 26 items
BALLN MUSTANG 5X80X75 (BALLOONS) ×3
BALLN MUSTANG 7X80X75 (BALLOONS) ×3
BALLOON MUSTANG 5X80X75 (BALLOONS) ×2 IMPLANT
BALLOON MUSTANG 7X80X75 (BALLOONS) ×2 IMPLANT
CATH BEACON 5 .035 65 KMP TIP (CATHETERS) ×3 IMPLANT
CATH OMNI FLUSH 5F 65CM (CATHETERS) ×3 IMPLANT
DEVICE CONTINUOUS FLUSH (MISCELLANEOUS) ×3 IMPLANT
DEVICE TORQUE .025-.038 (MISCELLANEOUS) ×3 IMPLANT
GUIDEWIRE ANGLED .035X150CM (WIRE) ×3 IMPLANT
KIT ENCORE 26 ADVANTAGE (KITS) ×3 IMPLANT
KIT MICROPUNCTURE NIT STIFF (SHEATH) ×6 IMPLANT
KIT PV (KITS) ×3 IMPLANT
SHEATH BRITE TIP 7FR 35CM (SHEATH) ×3 IMPLANT
SHEATH PINNACLE 5F 10CM (SHEATH) ×9 IMPLANT
SHEATH PINNACLE 7F 10CM (SHEATH) ×3 IMPLANT
SHEATH PROBE COVER 6X72 (BAG) ×6 IMPLANT
STENT INNOVA 8X80X130 (Permanent Stent) ×3 IMPLANT
STENT VIABAHNBX 8X59X80 (Permanent Stent) ×3 IMPLANT
STOPCOCK MORSE 400PSI 3WAY (MISCELLANEOUS) ×3 IMPLANT
SYR MEDRAD MARK V 150ML (SYRINGE) ×3 IMPLANT
TRANSDUCER W/STOPCOCK (MISCELLANEOUS) ×3 IMPLANT
TRAY PV CATH (CUSTOM PROCEDURE TRAY) ×3 IMPLANT
WIRE AMPLATZ SS-J .035X180CM (WIRE) ×3 IMPLANT
WIRE BENTSON .035X145CM (WIRE) ×3 IMPLANT
WIRE J 3MM .035X145CM (WIRE) ×3 IMPLANT
WIRE ROSEN-J .035X260CM (WIRE) ×3 IMPLANT

## 2019-08-20 NOTE — Consult Note (Signed)
Hospital Consult    Reason for Consult:  Right foot wound Referring Physician:  Dr. Wynelle Cleveland MRN #:  [id number]  History of Present Illness: This is a 68 y.o. male without significant past medical history other than as listed below.  His current everyday smoker.  Denies hypertension hyperlipidemia.  Denies diabetes.  States his parents both had heart attacks at young ages.  Has had wounds on his right foot for several weeks worsening pain now presents to the ER and is admitted.  Does not have any fevers or chills at this time.  Does not take blood thinners.  Does not take aspirin or statin.  Past Medical History:  Diagnosis Date  . COPD (chronic obstructive pulmonary disease) ([place])   . Erectile dysfunction   . Impaired glucose tolerance   . Nephrolithiasis   . Pancreatitis, recurrent   . Thrombocytopenia ([place]) 08/10/2018   Staying in the low 100s will follow closely    Past Surgical History:  Procedure Laterality Date  . CATARACT EXTRACTION W/PHACO Right 05/02/2018   Procedure: CATARACT EXTRACTION PHACO AND INTRAOCULAR LENS PLACEMENT (I[facility]);  Surgeon: Baruch Goldmann, MD;  Location: [facility];  Service: Ophthalmology;  Laterality: Right;  CDE: 11.11  . CATARACT EXTRACTION W/PHACO Left 07/04/2018   Procedure: CATARACT EXTRACTION PHACO AND INTRAOCULAR LENS PLACEMENT (IOC);  Surgeon: Baruch Goldmann, MD;  Location: [facility];  Service: Ophthalmology;  Laterality: Left;  CDE: 7.15  . CHOLECYSTECTOMY    . ORCHIECTOMY    . VASECTOMY      No Known Allergies  Prior to Admission medications   Medication Sig Start Date End Date Taking? Authorizing Provider  acetaminophen (TYLENOL) 500 MG tablet Take 500 mg by mouth every 8 (eight) hours as needed for moderate pain.    Yes [provider]    Social History   Socioeconomic History  . Marital status: Legally Separated    Spouse name: Not on file  . Number of children: Not on file  . Years of education: Not on file  . Highest education  level: Not on file  Occupational History  . Occupation: Art gallery manager: [employer]  . Financial resource strain: Not on file  . Food insecurity    Worry: Not on file    Inability: Not on file  . Transportation needs    Medical: Not on file    Non-medical: Not on file  Tobacco Use  . Smoking status: Current Every Day Smoker    Packs/day: 1.50    Types: Cigarettes  . Smokeless tobacco: Never Used  Substance and Sexual Activity  . Alcohol use: No  . Drug use: No  . Sexual activity: Not on file  Lifestyle  . Physical activity    Days per week: Not on file    Minutes per session: Not on file  . Stress: Not on file  Relationships  . Social Herbalist on phone: Not on file    Gets together: Not on file    Attends religious service: Not on file    Active member of club or organization: Not on file    Attends meetings of clubs or organizations: Not on file    Relationship status: Not on file  . Intimate partner violence    Fear of current or ex partner: Not on file    Emotionally abused: Not on file    Physically abused: Not on file    Forced sexual activity: Not  on file  Other Topics Concern  . Not on file  Social History Narrative  . Not on file     Family History  Problem Relation Age of Onset  . Hypertension Father   . Coronary artery disease Father   . Coronary artery disease Mother   . Cancer Mother        Breast    ROS: Right foot wound pain   Physical Examination  Vitals:   08/20/19 0047 08/20/19 0339  BP: (!) 165/105 (!) 157/88  Pulse: 93 82  Resp: 20 20  Temp: (!) 100.4 F (38 C) (!) 100.6 F (38.1 C)  SpO2: 96% 99%   Body mass index is 19.02 kg/m.  General:  WDWN in NAD HENT: WNL, normocephalic Pulmonary: normal non-labored breathing Cardiac: No palpable pedal pulses Abdomen: soft, NT/ND, no masses Extremities: Right foot with multiple toes gangrenous changes Musculoskeletal: no muscle  wasting or atrophy  Neurologic: A&O X 3; Appropriate Affect ; SENSATION: normal; MOTOR FUNCTION:  moving all extremities equally. Speech is fluent/normal   CBC    Component Value Date/Time   WBC 12.4 (H) 08/19/2019 1827   RBC 5.38 08/19/2019 1827   HGB 15.6 08/19/2019 1827   HCT 48.8 08/19/2019 1827   PLT 188 08/19/2019 1827   MCV 90.7 08/19/2019 1827   MCH 29.0 08/19/2019 1827   MCHC 32.0 08/19/2019 1827   RDW 14.2 08/19/2019 1827   LYMPHSABS 1.3 08/19/2019 1827   MONOABS 1.0 08/19/2019 1827   EOSABS 0.0 08/19/2019 1827   BASOSABS 0.1 08/19/2019 1827    BMET    Component Value Date/Time   NA 138 08/19/2019 1827   NA 141 10/11/2015 1041   K 3.5 08/19/2019 1827   CL 99 08/19/2019 1827   CO2 28 08/19/2019 1827   GLUCOSE 91 08/19/2019 1827   BUN 13 08/19/2019 1827   BUN 11 10/11/2015 1041   CREATININE 0.79 08/19/2019 1827   CREATININE 0.70 08/01/2018 1126   CALCIUM 9.2 08/19/2019 1827   GFRNONAA >60 08/19/2019 1827   GFRNONAA 97 08/01/2018 1126   GFRAA >60 08/19/2019 1827   GFRAA 112 08/01/2018 1126    COAGS: Lab Results  Component Value Date   INR 1.1 08/19/2019   INR 1.09 09/17/2015     Non-Invasive Vascular Imaging:   ABI pending  ASSESSMENT/PLAN: This is a 68 y.o. male here with what appears to be chronic right lower extremity ischemia.  Has gangrenous changes to right first second and third toe.  We will plan for angiogram possible intervention today.  Dr. Sharol Given following will likely need amputation of those toes.  Tharun Cappella C. Donzetta Matters, MD [facility] [place] Office: [phone] Pager: [phone]

## 2019-08-20 NOTE — Op Note (Signed)
Patient name: Raymond Walker. 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  Ultrasound-guided access of the left common femoral artery 2.  Aortogram with bilateral lower extremity arteriogram 3.  Ultrasound-guided access of the right common femoral artery 4.  Right common iliac stent (8 mm x 59 mm VBX) 5.  Right external iliac angioplasty with stent placement (pre-dilation with 5 mm Mustang, 8 mm x 80 mm self-expanding Innova, post angioplastied with a 7 mm Mustang) 6.  78 minutes of monitored moderate conscious sedation  Indications: Patient is a 68 year old male who was seen in consultation earlier today by Dr. Donzetta Matters with critical limb ischemia of his right lower extremity with tissue loss.  He presents for planned aortogram lower extremity arteriogram and possible intervention of the right leg after risk and benefits were discussed.  Findings:   Aortogram showed that the right common as well as external iliac artery were completely occluded.  He did reconstitute a distal right external iliac just above the common femoral with profunda runoff.  On the left the common iliac was ectatic and the proximal external iliac had a 50 to 60% stenosis.  The left common femoral artery was highly diseased and has a high-grade greater than 70% stenosis.  Right lower extremity which is the site of interest shows a flush SFA occlusion with profunda runoff.  He does reconstitute a diseased distal SFA with patent popliteal and at least two-vessel runoff via the posterior tibial and peroneal artery.  Left lower extremity shows a flush SFA occlusion as well as profunda runoff.  He does reconstitute a distal SFA with patent popliteal artery with at least three-vessel runoff.  After retrograde access of the right  common femoral artery, I was able to cross the right external and common iliac occlusion.  A 8 mm VBX was placed in the common iliac artery and a 8 mm Innova was placed in the external iliac artery.  Patient now has inline flow down the right iliac with no residual stenosis and a bounding right femoral pulse and no residual stenosis.   Procedure:  The patient was identified in the holding area and taken to room 8.  The patient was then placed supine on the table and prepped and draped in the usual sterile fashion.  A time out was called.  Ultrasound was used to evaluate the left common femoral artery.  It was patent .  A digital ultrasound image was acquired.  A micropuncture needle was used to access the left common femoral artery under ultrasound guidance.  An 018 wire was advanced without resistance and a micropuncture sheath was placed.  The 018 wire was removed and a benson wire was placed.  The micropuncture sheath was exchanged for a 5 french sheath.  An omniflush catheter was advanced over the wire to the level of L-1.  An abdominal angiogram was obtained.  Next Omni Flush catheter was pulled down and bilateral lower extremity runoff was obtained.  Pertinent findings are noted above.  Ultimately elected to try and cross the right iliac occlusion retrograde.  At that point in time I used ultrasound and evaluated the right common femoral artery.  It was patent and image was saved.  I accessed the right common femoral artery with micro access needle placed a microwire and exchanged for micro-sheath.  Initially  placed a Bentson wire try to get a 5 French sheath in the right common femoral artery but did not have enough support and we lost access.  I had to reaccess the right common femoral artery again with ultrasound guidance placing a micro wire and micro-sheath.  This time I placed a Rosen wire for added support and got a 5 French sheath in the right groin.  I then used a KMP catheter with softening of  glide and crossed the right external and common iliac artery occlusion retrograde until I got in the native infrarenal aorta.  That point in time I gave 100 units/kg heparin IV.  I predilated the entire iliac with a 5 mm Mustang after exchanging for an amplatz wire.  Then I was able to get a long 7 French bright tip sheath into the infrarenal aorta.  I then injected through the sheath to get a planning shot and selected stents based on the sizes.  I pulled the sheath back after initially placing a 8 mm x 59 mm VBX in the right common iliac and I hung this just into the aortic bifurcation but did not feel kissing stents were necessary since the left common iliac was ectatic and I had a very small stump on the right.  The VBX was inflated to 12 mmHg nominal pressure.  I then got another shot from the pigtail catheter on the left side.  I elected to bridge the gap in the external iliac with an 8 mm x 80 mm self-expanding Innova.  This was initially deployed just inside of the distal VBX stent into the right external iliac artery.  This was post angioplasty with a 7 mm balloon.  One final aortogram through the catheter of the left groin showed inline flow down the right iliac with no residual stenosis.  Patient had an excellent femoral pulse.  That point in time I exchanged for a short 7 French sheath in the right groin and all other wires and catheters were removed.  A 5 French sheath remained in the left groin.  He will be taken to PACU to have both of these sheaths removed with manual pressure.   Plan: Now that the right iliac has been revascularized will need to be considered for right common femoral to popliteal bypass for right lower extremity tissue loss.  He was loaded on plavix for the right iliac stents.  Marty Heck, MD [facility] [place] Office: [phone] Pager: [place]

## 2019-08-20 NOTE — Progress Notes (Signed)
CRITICAL VALUE ALERT  Critical Value:  Blood Culture gram + cocci  Date & Time Notied:  08-20-2019 @ 1121  Provider Notified: Dr. Wynelle Cleveland notified via page  Orders Received/Actions taken: no orders yet

## 2019-08-20 NOTE — Care Management Important Message (Deleted)
Important Message  Patient Details  Name: Raymond Walker. MRN: [id number] Date of Birth: 06-09-1951   Medicare Important Message Given:  Yes     [place], [place] 08/20/2019, 11:32 AM

## 2019-08-20 NOTE — Progress Notes (Addendum)
PROGRESS NOTE    Raymond Radon.   UDJ:[id number]  DOB: 10/09/1951  DOA: 08/19/2019 PCP: [name], MD   Brief Narrative:  Raymond Radon. is a 25 with a history of  tobacco abuse, glucose intolerance, orchiectomy and vasectomy, erectile dysfunction, nephrolithiasis who is not on any medications at home comes to the ED because he has been dealing with blackening and ulcers on the toes of his right foot which have been progressing for about 2 months now. He has not seen his PCP for them.  In the ED temperature was 102.8, heart rate 111, respiration 22, lactic acid 2.3, WBC count 12.4.  Pedal pulses were not able to be found with Dopplers.   Subjective: The patient is complaining of severe pain in his right foot and wants anything to help knock him out.  He admits to smoking 1 pack of cigarettes per day.    Assessment & Plan:   Principal Problem: Right foot gangrene and cellulitis severe sepsis- fever tachycardia, leukocytosis - lactic acid 2.3, CRP 19.1, ESR 24 -has poor blood flow to right foot- unable to doppler a pulse -I have requested a ortho and vascular surgery consult - vascular has started aspirin and crestor- will go for angiogram later today - cont Vanc,  Cefepime and Flagyl - having severe pain- IV Morphine and Oxycodone started  Active Problems:  Hematuria - "large" Hb on UA- h/o nephrolithiasis per CT imaging- he does not complaint of flank pain - recheck UA  HTN - Metoprolol started by admitting doc for coronary calcifications seen on CT - may be from pain- will cont to follow BP and control pain and infection      Coronary artery calcification seen on CAT scan - no c/o chest pain - an ECHO was ordered by admitting doctor and he was started on Metoprolol- follow     COPD (chronic obstructive pulmonary disease)? 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 - need PFTs- needs to quit smoking    Tobacco abuse - have advised him to quit especially in light of his gangrene - Nicotine patch ordered  Time spent in minutes: 45 DVT prophylaxis: Lovenox Code Status: Full code Family Communication:  Disposition Plan: cont to treat current illness- will need an amputation Consultants:   Ortho  Vascular surgery Procedures:   none Antimicrobials:  Anti-infectives (From admission, onward)   Start     Dose/Rate Route Frequency Ordered Stop   08/20/19 0800  vancomycin (VANCOCIN) IVPB 750 mg/150 ml premix     750 mg 150 mL/hr over 60 Minutes Intravenous Every 12 hours 08/19/19 2152     08/19/19 2300  ceFEPIme (MAXIPIME) 2 g in sodium chloride 0.9 % 100 mL IVPB     2 g 200 mL/hr over 30 Minutes Intravenous Every 8 hours 08/19/19 2152     08/19/19 2200  metroNIDAZOLE (FLAGYL) tablet 500 mg     500 mg Oral Every 8 hours 08/19/19 2049     08/19/19 1845  vancomycin (VANCOCIN) IVPB 1000 mg/200 mL premix     1,000 mg 200 mL/hr over 60 Minutes Intravenous  Once 08/19/19 1835 08/19/19 2128   08/19/19 1845  piperacillin-tazobactam (ZOSYN) IVPB 3.375 g     3.375 g 100 mL/hr over 30 Minutes Intravenous  Once 08/19/19 1835 08/19/19 2128  Objective: Vitals:   08/20/19 0052 08/20/19 0339 08/20/19 0621 08/20/19 0805  BP:  (!) 157/88  (!) 179/93  Pulse:  82  92  Resp:  20  18  Temp:  (!) 100.6 F (38.1 C)  99.7 F (37.6 C)  TempSrc:  Oral  Oral  SpO2:  99%  99%  Weight: 63.8 kg  63.6 kg   Height: 6' (1.829 m)       Intake/Output Summary (Last 24 hours) at 08/20/2019 1028 Last data filed at 08/20/2019 0844 Gross per 24 hour  Intake -  Output 626 ml  Net -626 ml   Filed Weights   08/19/19 2130 08/20/19 0052 08/20/19 0621  Weight: 63.5 kg 63.8 kg 63.6 kg    Examination: General exam: Appears comfortable  HEENT: PERRLA, oral mucosa moist,  no sclera icterus or thrush Respiratory system: Clear to auscultation. Respiratory effort normal. Cardiovascular system: S1 & S2 heard, RRR.   Gastrointestinal system: Abdomen soft, non-tender, nondistended. Normal bowel sounds. Central nervous system: Alert and oriented. No focal neurological deficits. Extremities: No cyanosis, clubbing - erythema and edema of right foot Skin: gangrenous toes right 1st, 2nd, 3rd Psychiatry:  Mood & affect appropriate.     Data Reviewed: I have personally reviewed following labs and imaging studies  CBC: Recent Labs  Lab 08/19/19 1827  WBC 12.4*  NEUTROABS 10.0*  HGB 15.6  HCT 48.8  MCV 90.7  PLT 601   Basic Metabolic Panel: Recent Labs  Lab 08/19/19 1827  NA 138  K 3.5  CL 99  CO2 28  GLUCOSE 91  BUN 13  CREATININE 0.79  CALCIUM 9.2   GFR: Estimated Creatinine Clearance: 78.4 mL/min (by C-G formula based on SCr of 0.79 mg/dL). Liver Function Tests: Recent Labs  Lab 08/19/19 1827  AST 10*  ALT 7  ALKPHOS 96  BILITOT 0.6  PROT 8.7*  ALBUMIN 4.0   No results for input(s): LIPASE, AMYLASE in the last 168 hours. No results for input(s): AMMONIA in the last 168 hours. Coagulation Profile: Recent Labs  Lab 08/19/19 1827  INR 1.1   Cardiac Enzymes: No results for input(s): CKTOTAL, CKMB, CKMBINDEX, TROPONINI in the last 168 hours. BNP (last 3 results) No results for input(s): PROBNP in the last 8760 hours. HbA1C: No results for input(s): HGBA1C in the last 72 hours. CBG: Recent Labs  Lab 08/20/19 0053 08/20/19 0624  GLUCAP 111* 99   Lipid Profile: No results for input(s): CHOL, HDL, LDLCALC, TRIG, CHOLHDL, LDLDIRECT in the last 72 hours. Thyroid Function Tests: No results for input(s): TSH, T4TOTAL, FREET4, T3FREE, THYROIDAB in the last 72 hours. Anemia Panel: No results for input(s): VITAMINB12, FOLATE, FERRITIN, TIBC, IRON, RETICCTPCT in the last 72 hours. Urine analysis:    Component Value Date/Time    COLORURINE YELLOW 08/19/2019 2000   APPEARANCEUR CLEAR 08/19/2019 2000   LABSPEC 1.019 08/19/2019 2000   PHURINE 6.0 08/19/2019 2000   GLUCOSEU NEGATIVE 08/19/2019 2000   HGBUR LARGE (A) 08/19/2019 [place] NEGATIVE 08/19/2019 2000   KETONESUR 5 (A) 08/19/2019 2000   PROTEINUR NEGATIVE 08/19/2019 2000   NITRITE NEGATIVE 08/19/2019 2000   LEUKOCYTESUR NEGATIVE 08/19/2019 2000   Sepsis Labs: _0 (procalcitonin:4,lacticidven:4) ) Recent Results (from the past 240 hour(s))  Blood Culture (routine x 2)     Status: None (Preliminary result)   Collection Time: 08/19/19  6:27 PM   Specimen: BLOOD  Result Value Ref Range Status   Specimen Description BLOOD RIGHT ANTECUBITAL  Final  Special Requests   Final    BOTTLES DRAWN AEROBIC AND ANAEROBIC Blood Culture adequate volume   Culture   Final    NO GROWTH < 12 HOURS Performed at [facility], [address], [place], [place] [address]    Report Status PENDING  Incomplete  Blood Culture (routine x 2)     Status: None (Preliminary result)   Collection Time: 08/19/19  7:13 PM   Specimen: BLOOD  Result Value Ref Range Status   Specimen Description BLOOD LEFT ANTECUBITAL  Final   Special Requests   Final    BOTTLES DRAWN AEROBIC AND ANAEROBIC Blood Culture adequate volume   Culture   Final    NO GROWTH < 12 HOURS Performed at [facility], [address] [place], [place] [address]    Report Status PENDING  Incomplete  SARS Coronavirus 2 [facility] order, Performed in [place] hospital lab) Nasopharyngeal Nasopharyngeal Swab     Status: None   Collection Time: 08/19/19  8:11 PM   Specimen: Nasopharyngeal Swab  Result Value Ref Range Status   SARS Coronavirus 2 NEGATIVE NEGATIVE Final    Comment: (NOTE) If result is NEGATIVE SARS-CoV-2 target nucleic acids are NOT DETECTED. The SARS-CoV-2 RNA is generally detectable in upper and lower  respiratory specimens during the acute phase of infection. The lowest   concentration of SARS-CoV-2 viral copies this assay can detect is 250  copies / mL. A negative result does not preclude SARS-CoV-2 infection  and should not be used as the sole basis for treatment or other  patient management decisions.  A negative result may occur with  improper specimen collection / handling, submission of specimen other  than nasopharyngeal swab, presence of viral mutation(s) within the  areas targeted by this assay, and inadequate number of viral copies  (<250 copies / mL). A negative result must be combined with clinical  observations, patient history, and epidemiological information. If result is POSITIVE SARS-CoV-2 target nucleic acids are DETECTED. The SARS-CoV-2 RNA is generally detectable in upper and lower  respiratory specimens dur ing the acute phase of infection.  Positive  results are indicative of active infection with SARS-CoV-2.  Clinical  correlation with patient history and other diagnostic information is  necessary to determine patient infection status.  Positive results do  not rule out bacterial infection or co-infection with other viruses. If result is PRESUMPTIVE POSTIVE SARS-CoV-2 nucleic acids MAY BE PRESENT.   A presumptive positive result was obtained on the submitted specimen  and confirmed on repeat testing.  While 2019 novel coronavirus  (SARS-CoV-2) nucleic acids may be present in the submitted sample  additional confirmatory testing may be necessary for epidemiological  and / or clinical management purposes  to differentiate between  SARS-CoV-2 and other Sarbecovirus currently known to infect humans.  If clinically indicated additional testing with an alternate test  methodology [phone]) is advised. The SARS-CoV-2 RNA is generally  detectable in upper and lower respiratory sp ecimens during the acute  phase of infection. The expected result is Negative. Fact Sheet for Patients:  StrictlyIdeas.no Fact Sheet  for Healthcare Providers: BankingDealers.co.za This test is not yet approved or cleared by the [place] FDA and has been authorized for detection and/or diagnosis of SARS-CoV-2 by FDA under an Emergency Use Authorization (EUA).  This EUA will remain in effect (meaning this test can be used) for the duration of the COVID-19 declaration under Section 564(b)(1) of the Act, 21 U.S.C. section 360bbb-3(b)(1), unless the authorization is terminated  or revoked sooner. Performed at [facility], [address], [place], [place] [address]          Radiology Studies: Dg Tibia/fibula Right  Result Date: 08/19/2019 CLINICAL DATA:  Soft tissue infection EXAM: RIGHT TIBIA AND FIBULA - 2 VIEW COMPARISON:  None. FINDINGS: There is no acute displaced fracture. No dislocation. The soft tissues are grossly unremarkable. There may be an intra-articular loose body in the anterior joint space of the knee. IMPRESSION: Negative. Electronically Signed   By: Constance Holster M.D.   On: 08/19/2019 19:31   Dg Chest Port 1 View  Result Date: 08/19/2019 CLINICAL DATA:  Foot infection EXAM: PORTABLE CHEST 1 VIEW COMPARISON:  Chest x-ray dated 10/10/2015 FINDINGS: There are chronic lung changes bilaterally with likely underlying emphysematous changes and scattered areas of scarring and atelectasis. There is no pneumothorax. No large pleural effusion. The heart size is stable. The aorta may be slightly dilated but is otherwise unremarkable. The lungs appear hyperexpanded. There is no acute osseous abnormality detected on this exam. There is a rounded density projecting over the left mid lung zone which is stable from prior study and is favored to represent a nipple shadow IMPRESSION: No active disease. Electronically Signed   By: Constance Holster M.D.   On: 08/19/2019 19:25   Dg Foot Complete Right  Result Date: 08/19/2019 CLINICAL DATA:  Foot infection. EXAM: RIGHT FOOT COMPLETE - 3+ VIEW  COMPARISON:  None. FINDINGS: There is osseous destruction of the distal phalanx of the first digit with an associated soft tissue defect. There is destruction or amputation of the distal phalanx of the second digit. There appears to be cortical erosions involving the middle phalanx of the second digit. Multiple ulcerations are noted along the medial aspect of the foot. There is a plantar calcaneal spur. There are degenerative changes of the midfoot. IMPRESSION: 1. Findings concerning for osteomyelitis involving the distal phalanx of the first digit. 2. Possible osteomyelitis involving the middle phalanx of the second digit. The majority of the distal phalanx of the second digit is absent which may be postsurgical or post infectious in etiology. 3. Multiple soft tissue ulcers are noted involving the medial aspect of the foot. Electronically Signed   By: Constance Holster M.D.   On: 08/19/2019 19:29      Scheduled Meds: . aspirin EC  81 mg Oral Daily  . enoxaparin (LOVENOX) injection  40 mg Subcutaneous Q24H  . metoprolol succinate  25 mg Oral Daily  . metroNIDAZOLE  500 mg Oral Q8H  . rosuvastatin  10 mg Oral q1800   Continuous Infusions: . sodium chloride 1,000 mL (08/20/19 1005)  . sodium chloride 250 mL (08/20/19 6840)  . ceFEPime (MAXIPIME) IV 2 g (08/20/19 0609)  . vancomycin 750 mg (08/20/19 0820)     LOS: 1 day      Debbe Odea, MD [facility] Pager: [email or web address] Password [email or web address] 08/20/2019, 10:28 AM

## 2019-08-20 NOTE — Progress Notes (Signed)
Initial Nutrition Assessment  DOCUMENTATION CODES:   Severe malnutrition in context of chronic illness  INTERVENTION:   -RD will follow for diet advancement and supplement as appropriate  NUTRITION DIAGNOSIS:   Severe Malnutrition related to chronic illness(COPD, pancreatitis) as evidenced by severe fat depletion, severe muscle depletion.  GOAL:   Patient will meet greater than or equal to 90% of their needs  MONITOR:   PO intake, Supplement acceptance, Diet advancement, Labs, Weight trends, Skin, I & O's  REASON FOR ASSESSMENT:   Consult Wound healing  ASSESSMENT:   Raymond Walker. is a 68 y.o. male with medical history significant of COPD, erectile dysfunction, glucose intolerance, history of nephrolithiasis, recurrent pancreatitis, history of thrombocytopenia, current smoker who is coming to the emergency department due to 2 to 3-day exacerbation of chronic right foot toes ulcers that have been present for several months.  Right foot is more edematous, erythematous and mildly tender in recent days.  He states that he probably did initial trauma when coating his toenails a few months ago.  He denies fever, chills, sore throat, rhinorrhea, wheezing or hemoptysis.  No chest pain, palpitations, dizziness, diaphoresis, PND, orthopnea or pitting edema of the lower extremities.  Denies abdominal pain, nausea or vomiting, diarrhea, constipation, melena or hematochezia.  No dysuria, frequency or hematuria.  Denies blurred vision or polyphagia, but states that sometimes he gets some polydipsia and polyuria.  Pt admitted with diabetic foot ulcer with osteomyelitis.   Reviewed I/O's: -500 ml x 24 hours  Per vascular surgery notes, pt with gangrenous changes to right first second and third toe. Plan for angiogram today; pt currently NPO. Pt may need amputation.   Pt somnolent at time of visit. He did not respond to voice. Pt appeared to be in extreme physical pain.  Reviewed wt hx;  noted pt has experienced a 10% wt loss x 1year.   Lab Results  Component Value Date   HGBA1C 5.9 (H) 08/19/2019   PTA DM medications are none.   Labs reviewed: CBGS: 89.  NUTRITION - FOCUSED PHYSICAL EXAM:    Most Recent Value  Orbital Region  Severe depletion  Upper Arm Region  Unable to assess  Thoracic and Lumbar Region  Unable to assess  Buccal Region  Severe depletion  Temple Region  Severe depletion  Clavicle Bone Region  Severe depletion  Clavicle and Acromion Bone Region  Unable to assess  Scapular Bone Region  Severe depletion  Dorsal Hand  Moderate depletion  Patellar Region  Severe depletion  Anterior Thigh Region  Severe depletion  Posterior Calf Region  Severe depletion  Edema (RD Assessment)  None  Hair  Reviewed  Eyes  Reviewed  Mouth  Reviewed  Skin  Reviewed  Nails  Reviewed       Diet Order:   Diet Order            Diet NPO time specified Except for: Sips with Meds  Diet effective now              EDUCATION NEEDS:   Not appropriate for education at this time  Skin:  Skin Assessment: Reviewed RN Assessment  Last BM:  9/2/200  Height:   Ht Readings from Last 1 Encounters:  08/20/19 6' (1.829 m)    Weight:   Wt Readings from Last 1 Encounters:  08/20/19 63.6 kg    Ideal Body Weight:  80.9 kg  BMI:  Body mass index is 19.02 kg/m.  Estimated Nutritional Needs:  Kcal:  2000-2200  Protein:  110-125 grams  Fluid:  > 2 L    Alazae Crymes A. Jimmye Norman, RD, LDN, [place] Registered Dietitian II Certified Diabetes Care and Education Specialist Pager: [phone] After hours Pager: [phone]

## 2019-08-20 NOTE — Progress Notes (Signed)
PHARMACY - PHYSICIAN COMMUNICATION CRITICAL VALUE ALERT - BLOOD CULTURE IDENTIFICATION (BCID)  Results for orders placed or performed during the hospital encounter of 08/19/19  Blood Culture ID Panel (Reflexed) (Collected: 08/19/2019  6:27 PM)  Result Value Ref Range   Enterococcus species NOT DETECTED NOT DETECTED   Listeria monocytogenes NOT DETECTED NOT DETECTED   Staphylococcus species DETECTED (A) NOT DETECTED   Staphylococcus aureus (BCID) DETECTED (A) NOT DETECTED   Methicillin resistance NOT DETECTED NOT DETECTED   Streptococcus species NOT DETECTED NOT DETECTED   Streptococcus agalactiae NOT DETECTED NOT DETECTED   Streptococcus pneumoniae NOT DETECTED NOT DETECTED   Streptococcus pyogenes NOT DETECTED NOT DETECTED   Acinetobacter baumannii NOT DETECTED NOT DETECTED   Enterobacteriaceae species NOT DETECTED NOT DETECTED   Enterobacter cloacae complex NOT DETECTED NOT DETECTED   Escherichia coli NOT DETECTED NOT DETECTED   Klebsiella oxytoca NOT DETECTED NOT DETECTED   Klebsiella pneumoniae NOT DETECTED NOT DETECTED   Proteus species NOT DETECTED NOT DETECTED   Serratia marcescens NOT DETECTED NOT DETECTED   Haemophilus influenzae NOT DETECTED NOT DETECTED   Neisseria meningitidis NOT DETECTED NOT DETECTED   Pseudomonas aeruginosa NOT DETECTED NOT DETECTED   Candida albicans NOT DETECTED NOT DETECTED   Candida glabrata NOT DETECTED NOT DETECTED   Candida krusei NOT DETECTED NOT DETECTED   Candida parapsilosis NOT DETECTED NOT DETECTED   Candida tropicalis NOT DETECTED NOT DETECTED    Name of physician (or Provider) Contacted: Schorr NP  Changes to prescribed antibiotics required: possibly contaminant, no changes recommended at this time.  Erin Hearing PharmD., BCPS Clinical Pharmacist 08/20/2019 9:27 PM

## 2019-08-20 NOTE — Progress Notes (Signed)
  Echocardiogram 2D Echocardiogram has been performed.  Randa Lynn Stevi [place] 08/20/2019, 9:49 AM

## 2019-08-20 NOTE — Progress Notes (Signed)
Site area: right and left groin   Site Prior to Removal: level 0   Pressure Applied For 20 minutes    Bedrest Beginning at  1720   Manual:   yes   Patient Status During Pull:  stable   Post Pull Groin Site:  level 0   Post Pull Instructions Given:  yes   Post Pull Pulses Present: doppler   Dressing Applied:  gauze and tegaderm   Comments:  pain medicine effective

## 2019-08-20 NOTE — Progress Notes (Signed)
Pt transferred to cath lab.

## 2019-08-21 ENCOUNTER — Encounter (HOSPITAL_COMMUNITY): Payer: Medicare Other

## 2019-08-21 ENCOUNTER — Encounter (HOSPITAL_COMMUNITY): Payer: Self-pay | Admitting: Vascular Surgery

## 2019-08-21 ENCOUNTER — Inpatient Hospital Stay (HOSPITAL_COMMUNITY): Payer: Medicare Other

## 2019-08-21 DIAGNOSIS — R7881 Bacteremia: Secondary | ICD-10-CM

## 2019-08-21 DIAGNOSIS — I96 Gangrene, not elsewhere classified: Secondary | ICD-10-CM

## 2019-08-21 DIAGNOSIS — Z01810 Encounter for preprocedural cardiovascular examination: Secondary | ICD-10-CM

## 2019-08-21 DIAGNOSIS — M869 Osteomyelitis, unspecified: Secondary | ICD-10-CM

## 2019-08-21 DIAGNOSIS — J449 Chronic obstructive pulmonary disease, unspecified: Secondary | ICD-10-CM

## 2019-08-21 DIAGNOSIS — E43 Unspecified severe protein-calorie malnutrition: Secondary | ICD-10-CM | POA: Insufficient documentation

## 2019-08-21 DIAGNOSIS — L03115 Cellulitis of right lower limb: Secondary | ICD-10-CM

## 2019-08-21 DIAGNOSIS — E1169 Type 2 diabetes mellitus with other specified complication: Secondary | ICD-10-CM

## 2019-08-21 DIAGNOSIS — E1152 Type 2 diabetes mellitus with diabetic peripheral angiopathy with gangrene: Secondary | ICD-10-CM

## 2019-08-21 DIAGNOSIS — L97519 Non-pressure chronic ulcer of other part of right foot with unspecified severity: Secondary | ICD-10-CM

## 2019-08-21 DIAGNOSIS — M86171 Other acute osteomyelitis, right ankle and foot: Secondary | ICD-10-CM

## 2019-08-21 DIAGNOSIS — E11621 Type 2 diabetes mellitus with foot ulcer: Secondary | ICD-10-CM

## 2019-08-21 DIAGNOSIS — B9561 Methicillin susceptible Staphylococcus aureus infection as the cause of diseases classified elsewhere: Secondary | ICD-10-CM

## 2019-08-21 DIAGNOSIS — F17210 Nicotine dependence, cigarettes, uncomplicated: Secondary | ICD-10-CM

## 2019-08-21 LAB — GLUCOSE, CAPILLARY
Glucose-Capillary: 125 mg/dL — ABNORMAL HIGH (ref 70–99)
Glucose-Capillary: 131 mg/dL — ABNORMAL HIGH (ref 70–99)
Glucose-Capillary: 90 mg/dL (ref 70–99)
Glucose-Capillary: 104 mg/dL — ABNORMAL HIGH (ref 70–99)

## 2019-08-21 LAB — POCT ACTIVATED CLOTTING TIME: Activated Clotting Time: 246 seconds

## 2019-08-21 LAB — CBC WITH DIFFERENTIAL/PLATELET
Abs Immature Granulocytes: 0.06 10*3/uL (ref 0.00–0.07)
Basophils Absolute: 0.1 10*3/uL (ref 0.0–0.1)
Basophils Relative: 1 %
Eosinophils Absolute: 0 10*3/uL (ref 0.0–0.5)
Eosinophils Relative: 0 %
HCT: 37.6 % — ABNORMAL LOW (ref 39.0–52.0)
Hemoglobin: 12.4 g/dL — ABNORMAL LOW (ref 13.0–17.0)
Immature Granulocytes: 1 %
Lymphocytes Relative: 12 %
Lymphs Abs: 1.2 10*3/uL (ref 0.7–4.0)
MCH: 29.1 pg (ref 26.0–34.0)
MCHC: 33 g/dL (ref 30.0–36.0)
MCV: 88.3 fL (ref 80.0–100.0)
Monocytes Absolute: 1.1 10*3/uL — ABNORMAL HIGH (ref 0.1–1.0)
Monocytes Relative: 11 %
Neutro Abs: 7.3 10*3/uL (ref 1.7–7.7)
Neutrophils Relative %: 75 %
Platelets: 142 10*3/uL — ABNORMAL LOW (ref 150–400)
RBC: 4.26 MIL/uL (ref 4.22–5.81)
RDW: 13.8 % (ref 11.5–15.5)
WBC: 9.6 10*3/uL (ref 4.0–10.5)
nRBC: 0 % (ref 0.0–0.2)

## 2019-08-21 LAB — BASIC METABOLIC PANEL
Anion gap: 9 (ref 5–15)
BUN: 7 mg/dL — ABNORMAL LOW (ref 8–23)
CO2: 24 mmol/L (ref 22–32)
Calcium: 8.3 mg/dL — ABNORMAL LOW (ref 8.9–10.3)
Chloride: 106 mmol/L (ref 98–111)
Creatinine, Ser: 0.68 mg/dL (ref 0.61–1.24)
GFR calc Af Amer: >60 mL/min (ref >60)
GFR calc non Af Amer: >60 mL/min (ref >60)
Glucose, Bld: 98 mg/dL (ref 70–99)
Potassium: 3.3 mmol/L — ABNORMAL LOW (ref 3.5–5.1)
Sodium: 139 mmol/L (ref 135–145)

## 2019-08-21 LAB — URINE CULTURE

## 2019-08-21 MED ORDER — POTASSIUM CHLORIDE CRYS ER 20 MEQ PO TBCR
40.0000 meq | EXTENDED_RELEASE_TABLET | ORAL | Status: AC
  Administered 2019-08-21 (×2): 40 meq via ORAL
  Filled 2019-08-21 (×2): qty 2

## 2019-08-21 MED ORDER — CEFAZOLIN SODIUM-DEXTROSE 2-4 GM/100ML-% IV SOLN
2.0000 g | Freq: Three times a day (TID) | INTRAVENOUS | Status: DC
  Administered 2019-08-21 – 2019-08-29 (×25): 2 g via INTRAVENOUS
  Filled 2019-08-21 (×28): qty 100

## 2019-08-21 NOTE — Plan of Care (Signed)
  Problem: Education: Goal: Knowledge of General Education information will improve Description Including pain rating scale, medication(s)/side effects and non-pharmacologic comfort measures Outcome: Progressing   Problem: Health Behavior/Discharge Planning: Goal: Ability to manage health-related needs will improve Outcome: Progressing   

## 2019-08-21 NOTE — Consult Note (Addendum)
[name]    Date of Admission:  08/19/2019     Total days of antibiotics 3  Day 3 cefepime, metronidazole, vancomycin                Reason for Consult: MSSA Bacteremia     Referring Provider: CHAMP  Primary Care Provider: [name], MD    Assessment: Raymond Radon. is a 68 y.o. male admitted for evaluation of ischemic right toes with surrounding lateral full thickness skin ulcerations to the medial foot. He was found to be bacteremic with MSSA in 1/4 bottles.   For his staphylococcus aureus bacteremia he will need a transthoracic echocardiogram to assess for endocarditis - on exam he has no murmur, no implanted cardiac devices or valves. No artificial joints or other hardware and no signs/symptoms of other metastatic sites of infection. Source most likely his right ischemic foot with cellulitis. Repeat blood cultures ordered to prove clearance. Hold on PICC line for now. Will narrow to cefazolin alone.   Plain film reveals osteomyelitis of the distal phalanx of the first digit, however on exam his toe is completely necrotic due to long standing ischemia. Admission photos reveal erythematous midfoot and some evidence of streaking indicating he had cellulitis contributing, which has since improved. He will need at least 14 days of IV therapy to treat his bacteremia with further length of therapy dependent on intraoperative findings following amputation. It is my understanding that he is due to undergo femoral-popliteal artery bypass graft surgery with amputation of toes 1-3 next week.  I am a little worried about putting in graft material for him now being bacteremic. Would recommend consideration for more time with antibiotic treatment if this is something that has the ability to wait for.    Plan: 1. Stop vancomycin, cefepime and metronidazole 2. Start cefazolin IV  3. Repeat blood cultures now 4. Smoking cessation counseling discussed   5. Please hold on PICC line for now    Principal Problem:   MSSA bacteremia Active Problems:   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[name])   Tobacco abuse   Coronary artery calcification seen on CAT scan   Impaired glucose tolerance   COPD (chronic obstructive pulmonary disease) (HCC)   Protein-calorie malnutrition, severe   . aspirin EC  81 mg Oral Daily  . clopidogrel  75 mg Oral Q breakfast  . enoxaparin (LOVENOX) injection  40 mg Subcutaneous Q24H  . potassium chloride  40 mEq Oral Q4H  . rosuvastatin  10 mg Oral q1800  . sodium chloride flush  3 mL Intravenous Q12H    HPI: Raymond Fillinger. is a 68 y.o. male admitted on 08/19/2019 with fever and foot pain. He has a past medical history detailed below but significant for diabetes, COPD, tobacco use and chronic foot wound.   He was found to be bacteremic with methicillin sensitive staph aureus in 1 of 4 bottles from 9/02 work up. He has been on cefepime, metronidazole and vancomycin for treatment of right lower extremity ischemic wound with surrounding erythema. Osteomyelitis concern on imaging. His temperature on admission was 6 F with leukocytosis 12.4. ESR 24 / CRP 19. He estimates that the wound on the foot has been present for around 6 months. It started with pain at night when he would lie down to sleep when he noticed black tissue on several toe tips. This has progressively worsened.   He is followed  by vascular team and underwent aortogram with successful revascularization intervention to the right common iliac (stent placed) and right external iliac (angio/stent placement).   Since this intervention his foot pain has improved but still present. He is planned for 3 toe amputation on Tuesday from what he tells me. He has had no further feversHe is very tearful/remorseful during our visit that "he caused this on himself" and "put this all on his family". He has smoked for many years but this has motivated him to make  changes. He lives at home by himself but has some nieces in the area that could help care for him if needed but he is resistant to ask for help.    Review of Systems: Review of Systems  Constitutional: Negative for chills, fever and malaise/fatigue.  Respiratory: Positive for wheezing.   Cardiovascular: Negative for chest pain.  Gastrointestinal: Negative for abdominal pain, diarrhea and vomiting.  Genitourinary: Negative for dysuria.  Musculoskeletal: Negative for back pain, joint pain and neck pain.  Skin: Negative for itching and rash.  Neurological: Negative for dizziness and headaches.    Past Medical History:  Diagnosis Date  . COPD (chronic obstructive pulmonary disease) ([name])   . Erectile dysfunction   . Impaired glucose tolerance   . Nephrolithiasis   . Pancreatitis, recurrent   . Thrombocytopenia ([name]) 08/10/2018   Staying in the low 100s will follow closely    Social History   Tobacco Use  . Smoking status: Current Every Day Smoker    Packs/day: 1.50    Types: Cigarettes  . Smokeless tobacco: Never Used  Substance Use Topics  . Alcohol use: No  . Drug use: No    Family History  Problem Relation Age of Onset  . Hypertension Father   . Coronary artery disease Father   . Coronary artery disease Mother   . Cancer Mother        Breast   No Known Allergies  OBJECTIVE: Blood pressure 105/72, pulse 87, temperature 98.4 F (36.9 C), temperature source Oral, resp. rate 20, height 6' (1.829 m), weight 63.6 kg, SpO2 98 %.  Physical Exam Vitals signs and nursing note reviewed.  Constitutional:      Comments: Resting in bed quietly.   HENT:     Mouth/Throat:     Mouth: Mucous membranes are moist.     Pharynx: Oropharynx is clear.  Eyes:     General: No scleral icterus.    Pupils: Pupils are equal, round, and reactive to light.  Cardiovascular:     Rate and Rhythm: Normal rate and regular rhythm.     Heart sounds: No murmur.  Pulmonary:     Effort:  Pulmonary effort is normal.     Breath sounds: Normal breath sounds.     Comments: Barrel chest Abdominal:     General: There is no distension.     Palpations: Abdomen is soft.  Musculoskeletal:     Comments: Right foot with necrotic toes 1-3. Lateral ulcerations noted to more medial foot with improvement in erythema compared to initial photos on admission.   Skin:    General: Skin is warm and dry.     Capillary Refill: Unable to palpate any pulses in the right foot  Neurological:     Mental Status: He is alert and oriented to person, place, and time.     Lab Results Lab Results  Component Value Date   WBC 9.6 08/21/2019   HGB 12.4 (L) 08/21/2019   HCT 37.6 (  L) 08/21/2019   MCV 88.3 08/21/2019   PLT 142 (L) 08/21/2019    Lab Results  Component Value Date   CREATININE 0.68 08/21/2019   BUN 7 (L) 08/21/2019   NA 139 08/21/2019   K 3.3 (L) 08/21/2019   CL 106 08/21/2019   CO2 24 08/21/2019    Lab Results  Component Value Date   ALT 7 08/19/2019   AST 10 (L) 08/19/2019   ALKPHOS 96 08/19/2019   BILITOT 0.6 08/19/2019     Microbiology: Recent Results (from the past 240 hour(s))  Blood Culture (routine x 2)     Status: None (Preliminary result)   Collection Time: 08/19/19  6:27 PM   Specimen: BLOOD  Result Value Ref Range Status   Specimen Description   Final    BLOOD RIGHT ANTECUBITAL Performed at [facility], [address], [place], [place] [address]    Special Requests   Final    BOTTLES DRAWN AEROBIC AND ANAEROBIC Blood Culture adequate volume Performed at [facility] [place], [place] [address]    Culture  Setup Time   Final    GRAM POSITIVE COCCI AEROBIC BOTTLE ONLY Gram Stain Report Called to,Read Back By and Verified With: MADISON T. AT [place] AT [address] ON [id number] BY THOMPSON S. Organism ID to follow CRITICAL RESULT CALLED TO, READ BACK BY AND VERIFIED WITH: Tillman Sers [facility] 08/20/19 2005 JDW Performed at [facility], [place] [address] [place], [place] [address]    Culture GRAM POSITIVE COCCI  Final   Report Status PENDING  Incomplete  Blood Culture ID Panel (Reflexed)     Status: Abnormal   Collection Time: 08/19/19  6:27 PM  Result Value Ref Range Status   Enterococcus species NOT DETECTED NOT DETECTED Final   Listeria monocytogenes NOT DETECTED NOT DETECTED Final   Staphylococcus species DETECTED (A) NOT DETECTED Final    Comment: CRITICAL RESULT CALLED TO, READ BACK BY AND VERIFIED WITH: Tillman Sers [facility] 08/20/19 2005 JDW    Staphylococcus aureus (BCID) DETECTED (A) NOT DETECTED Final    Comment: Methicillin (oxacillin) susceptible Staphylococcus aureus (MSSA). Preferred therapy is anti staphylococcal beta lactam antibiotic (Cefazolin or Nafcillin), unless clinically contraindicated. CRITICAL RESULT CALLED TO, READ BACK BY AND VERIFIED WITH: Tillman Sers [facility] 08/20/19 2005 JDW    Methicillin resistance NOT DETECTED NOT DETECTED Final   Streptococcus species NOT DETECTED NOT DETECTED Final   Streptococcus agalactiae NOT DETECTED NOT DETECTED Final   Streptococcus pneumoniae NOT DETECTED NOT DETECTED Final   Streptococcus pyogenes NOT DETECTED NOT DETECTED Final   Acinetobacter baumannii NOT DETECTED NOT DETECTED Final   Enterobacteriaceae species NOT DETECTED NOT DETECTED Final   Enterobacter cloacae complex NOT DETECTED NOT DETECTED Final   Escherichia coli NOT DETECTED NOT DETECTED Final   Klebsiella oxytoca NOT DETECTED NOT DETECTED Final   Klebsiella pneumoniae NOT DETECTED NOT DETECTED Final   Proteus species NOT DETECTED NOT DETECTED Final   Serratia marcescens NOT DETECTED NOT DETECTED Final   Haemophilus influenzae NOT DETECTED NOT DETECTED Final   Neisseria meningitidis NOT DETECTED NOT DETECTED Final   Pseudomonas aeruginosa NOT DETECTED NOT DETECTED Final   Candida albicans NOT DETECTED NOT DETECTED Final   Candida glabrata NOT DETECTED NOT DETECTED Final   Candida krusei NOT DETECTED NOT DETECTED Final    Candida parapsilosis NOT DETECTED NOT DETECTED Final   Candida tropicalis NOT DETECTED NOT DETECTED Final    Comment: Performed at [facility], [address] [place], [place] [address]  Blood Culture (routine x 2)     Status: None (Preliminary result)   Collection Time: 08/19/19  7:13 PM   Specimen: BLOOD  Result Value Ref Range Status   Specimen Description BLOOD LEFT ANTECUBITAL  Final   Special Requests   Final    BOTTLES DRAWN AEROBIC AND ANAEROBIC Blood Culture adequate volume   Culture   Final    NO GROWTH 2 DAYS Performed at [facility], [address] [place], [place] [address]    Report Status PENDING  Incomplete  Urine culture     Status: None   Collection Time: 08/19/19  8:00 PM   Specimen: In/Out Cath Urine  Result Value Ref Range Status   Specimen Description   Final    IN/OUT CATH URINE Performed at [facility], [address], [place], [place] [address]    Special Requests   Final    NONE Performed at [facility], [address], [place], [place] [address]    Culture   Final    Multiple bacterial morphotypes present, none predominant. Suggest appropriate recollection if clinically indicated.   Report Status 08/21/2019 FINAL  Final  SARS Coronavirus 2 [facility] order, Performed in [facility] hospital lab) Nasopharyngeal Nasopharyngeal Swab     Status: None   Collection Time: 08/19/19  8:11 PM   Specimen: Nasopharyngeal Swab  Result Value Ref Range Status   SARS Coronavirus 2 NEGATIVE NEGATIVE Final    Comment: (NOTE) If result is NEGATIVE SARS-CoV-2 target nucleic acids are NOT DETECTED. The SARS-CoV-2 RNA is generally detectable in upper and lower  respiratory specimens during the acute phase of infection. The lowest  concentration of SARS-CoV-2 viral copies this assay can detect is 250  copies / mL. A negative result does not preclude SARS-CoV-2 infection  and should not be used as the sole basis for treatment or other  patient management  decisions.  A negative result may occur with  improper specimen collection / handling, submission of specimen other  than nasopharyngeal swab, presence of viral mutation(s) within the  areas targeted by this assay, and inadequate number of viral copies  (<250 copies / mL). A negative result must be combined with clinical  observations, patient history, and epidemiological information. If result is POSITIVE SARS-CoV-2 target nucleic acids are DETECTED. The SARS-CoV-2 RNA is generally detectable in upper and lower  respiratory specimens dur ing the acute phase of infection.  Positive  results are indicative of active infection with SARS-CoV-2.  Clinical  correlation with patient history and other diagnostic information is  necessary to determine patient infection status.  Positive results do  not rule out bacterial infection or co-infection with other viruses. If result is PRESUMPTIVE POSTIVE SARS-CoV-2 nucleic acids MAY BE PRESENT.   A presumptive positive result was obtained on the submitted specimen  and confirmed on repeat testing.  While 2019 novel coronavirus  (SARS-CoV-2) nucleic acids may be present in the submitted sample  additional confirmatory testing may be necessary for epidemiological  and / or clinical management purposes  to differentiate between  SARS-CoV-2 and other Sarbecovirus currently known to infect humans.  If clinically indicated additional testing with an alternate test  methodology [phone]) is advised. The SARS-CoV-2 RNA is generally  detectable in upper and lower respiratory sp ecimens during the acute  phase of infection. The expected result is Negative. Fact Sheet for Patients:  StrictlyIdeas.no Fact Sheet for Healthcare Providers: BankingDealers.co.za This test is not yet approved  or cleared by the [place] and has been authorized for detection and/or diagnosis of SARS-CoV-2 by FDA under an  Emergency Use Authorization (EUA).  This EUA will remain in effect (meaning this test can be used) for the duration of the COVID-19 declaration under Section 564(b)(1) of the Act, 21 U.S.C. section 360bbb-3(b)(1), unless the authorization is terminated or revoked sooner. Performed at [facility], [address] [place], [place] [address]     Janene Madeira, MSN, NP-C [facility] [email or web address] Pager: [phone] Office: [place]: [phone]   08/21/2019 11:23 AM

## 2019-08-21 NOTE — Progress Notes (Addendum)
[facility]  Subjective  - doing well, no new complaints from aortogram procedure yesterday.   Objective 105/72 87 98.4 F (36.9 C) (Oral) 20 98%  Intake/Output Summary (Last 24 hours) at 08/21/2019 0755 Last data filed at 08/20/2019 2040 Gross per 24 hour  Intake -  Output 626 ml  Net -626 ml    Feet warm, right toes 1-3 with dry gangrene no change Groin soft without hematoma Lungs non labored breathing  Assessment/Planning: POD # 1 aortogram with intervention   Right common iliac stent (8 mm x 59 mm VBX)  Right external iliac angioplasty with stent placement (pre-dilation with 5 mm Mustang, 8 mm x 80 mm self-expanding Innova, post angioplastied with a 7 mm Mustang)  Now that he has good inflow we will schedule him for right fem-pop bypass to provide arterial flow to heal up coming toe amputation on the right foot.   KVO fluid over the weekend he is independently voiding.     Roxy Horseman 08/21/2019 7:55 AM --  Laboratory Lab Results: Recent Labs    08/19/19 1827 08/21/19 0253  WBC 12.4* 9.6  HGB 15.6 12.4*  HCT 48.8 37.6*  PLT 188 142*   BMET Recent Labs    08/19/19 1827 08/21/19 0253  NA 138 139  K 3.5 3.3*  CL 99 106  CO2 28 24  GLUCOSE 91 98  BUN 13 7*  CREATININE 0.79 0.68  CALCIUM 9.2 8.3*    COAG Lab Results  Component Value Date   INR 1.1 08/19/2019   INR 1.09 09/17/2015   No results found for: PTT   I have independently interviewed and examined patient and agree with PA assessment and plan above.  We will get ABIs and vein mapping scheduled for right femoropopliteal bypass on Tuesday.  Brandon C. Donzetta Matters, MD [facility] Office: [phone] Pager: [phone]

## 2019-08-21 NOTE — Social Work (Signed)
CSW acknowledging consult for access to medications at discharge.  For medication access please consult RN Case Management.   TOC team following for postprocedural support.   Westley Hummer, MSW, [place] Work [phone]

## 2019-08-21 NOTE — Progress Notes (Signed)
Rechecked Temperature and gave Tylenol

## 2019-08-21 NOTE — Progress Notes (Addendum)
PROGRESS NOTE    Raymond Walker.   LJQ:[id number]  DOB: Feb 25, 1951  DOA: 08/19/2019 PCP: [name], MD   Brief Narrative:  Raymond Walker. is a 63 with a history of  tobacco abuse, glucose intolerance, orchiectomy and vasectomy, erectile dysfunction, nephrolithiasis who is not on any medications at home comes to the ED because he has been dealing with blackening and ulcers on the toes of his right foot which have been progressing for about 2 months now. He has not seen his PCP for them.  In the ED temperature was 102.8, heart rate 111, respiration 22, lactic acid 2.3, WBC count 12.4.  Pedal pulses were not able to be found with Dopplers.   Subjective: The patient is complaining of severe pain in his right foot and wants anything to help knock him out.  He admits to smoking 1 pack of cigarettes per day.    Assessment & Plan:   Principal Problem: Right foot gangrene and cellulitis severe sepsis- fever tachycardia, leukocytosis - lactic acid 2.3, CRP 19.1, ESR 24 -has poor blood flow to right foot- unable to doppler a pulse -I have requested a ortho and vascular surgery consult - vascular has started aspirin and crestor  - cont Vanc,  Cefepime and Flagyl -   IV Morphine and Oxycodone helping control pain - 9/3 > Right common iliac stent (8 mm x 59 mm VBX) Right external iliac angioplasty with stent placement (pre-dilation with 5 mm Mustang, 8 mm x 80 mm self-expanding Innova, post angioplastied with a 7 mm Mustang) - plan for right femoral bypass on Tues along with amputation  Active Problems:  Bacteremia - gram + cocci in 1 set of blood cultures from [facility]-   ID was consulted yesterday  Hematuria - "large" Hb on UA- he had and I and O caht which likely caused the hematuria - h/o nephrolithiasis per CT imaging- he does not complaint of flank pain - repeat UA showing less RBCs  HTN - Metoprolol started by admitting doc for coronary calcifications seen on CT - may  be from pain- will cont to follow BP and control pain and infection      Coronary artery calcification seen on CAT scan - no c/o chest pain - an ECHO was ordered by admitting doctor and he was started on Metoprolol- follow     COPD (chronic obstructive pulmonary disease)? 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 - need PFTs- needs to quit smoking    Tobacco abuse - have advised him to quit especially in light of his gangrene- he states he understands - Nicotine patch ordered  Time spent in minutes: 45 DVT prophylaxis: Lovenox Code Status: Full code Family Communication:  Disposition Plan: cont to treat current illness-  Consultants:   Ortho  Vascular surgery Procedures:   none Antimicrobials:  Anti-infectives (From admission, onward)   Start     Dose/Rate Route Frequency Ordered Stop   08/21/19 1400  ceFAZolin (ANCEF) IVPB 2g/100 mL premix     2 g 200 mL/hr over 30 Minutes Intravenous Every 8 hours 08/21/19 1117     08/20/19 0800  vancomycin (VANCOCIN) IVPB 750 mg/150 ml premix  Status:  Discontinued     750 mg 150 mL/hr over 60 Minutes Intravenous Every 12 hours 08/19/19 2152 08/21/19 1116  08/19/19 2300  ceFEPIme (MAXIPIME) 2 g in sodium chloride 0.9 % 100 mL IVPB  Status:  Discontinued     2 g 200 mL/hr over 30 Minutes Intravenous Every 8 hours 08/19/19 2152 08/21/19 1117   08/19/19 2200  metroNIDAZOLE (FLAGYL) tablet 500 mg  Status:  Discontinued     500 mg Oral Every 8 hours 08/19/19 2049 08/21/19 1117   08/19/19 1845  vancomycin (VANCOCIN) IVPB 1000 mg/200 mL premix     1,000 mg 200 mL/hr over 60 Minutes Intravenous  Once 08/19/19 1835 08/19/19 2128   08/19/19 1845  piperacillin-tazobactam (ZOSYN) IVPB 3.375 g     3.375 g 100 mL/hr over 30 Minutes Intravenous  Once 08/19/19 1835  08/19/19 2128       Objective: Vitals:   08/21/19 0914 08/21/19 1105 08/21/19 1155 08/21/19 1511  BP: 100/64 (!) 173/44 95/60 (!) 149/91  Pulse: (!) 55 68 71 64  Resp: _0 Temp: 97.9 F (36.6 C)   98.8 F (37.1 C)  TempSrc: Oral  Oral Oral  SpO2: 97% 97% 95% 97%  Weight:      Height:        Intake/Output Summary (Last 24 hours) at 08/21/2019 1705 Last data filed at 08/21/2019 1500 Gross per 24 hour  Intake 797.47 ml  Output 600 ml  Net 197.47 ml   Filed Weights   08/19/19 2130 08/20/19 0052 08/20/19 0621  Weight: 63.5 kg 63.8 kg 63.6 kg    Examination: General exam: Appears comfortable  HEENT: PERRLA, oral mucosa moist, no sclera icterus or thrush Respiratory system: Clear to auscultation. Respiratory effort normal. Cardiovascular system: S1 & S2 heard,  No murmurs  Gastrointestinal system: Abdomen soft, non-tender, nondistended. Normal bowel sounds   Central nervous system: Alert and oriented. No focal neurological deficits. Extremities: No cyanosis, clubbing or edema Skin: necrosis of right 1st, 2nd and 3rd toe Psychiatry:  Mood & affect appropriate.    Data Reviewed: I have personally reviewed following labs and imaging studies  CBC: Recent Labs  Lab 08/19/19 1827 08/21/19 0253  WBC 12.4* 9.6  NEUTROABS 10.0* 7.3  HGB 15.6 12.4*  HCT 48.8 37.6*  MCV 90.7 88.3  PLT 188 416*   Basic Metabolic Panel: Recent Labs  Lab 08/19/19 1827 08/21/19 0253  NA 138 139  K 3.5 3.3*  CL 99 106  CO2 28 24  GLUCOSE 91 98  BUN 13 7*  CREATININE 0.79 0.68  CALCIUM 9.2 8.3*   GFR: Estimated Creatinine Clearance: 78.4 mL/min (by C-G formula based on SCr of 0.68 mg/dL). Liver Function Tests: Recent Labs  Lab 08/19/19 1827  AST 10*  ALT 7  ALKPHOS 96  BILITOT 0.6  PROT 8.7*  ALBUMIN 4.0   No results for input(s): LIPASE, AMYLASE in the last 168 hours. No results for input(s): AMMONIA in the last 168 hours. Coagulation Profile: Recent Labs  Lab  08/19/19 1827  INR 1.1   Cardiac Enzymes: No results for input(s): CKTOTAL, CKMB, CKMBINDEX, TROPONINI in the last 168 hours. BNP (last 3 results) No results for input(s): PROBNP in the last 8760 hours. HbA1C: Recent Labs    08/19/19 1954  HGBA1C 5.9*   CBG: Recent Labs  Lab 08/20/19 1319 08/20/19 2127 08/21/19 0713 08/21/19 1151 08/21/19 1632  GLUCAP 89 148* 125* 131* 90   Lipid Profile: No results for input(s): CHOL, HDL, LDLCALC, TRIG, CHOLHDL, LDLDIRECT in the last 72 hours. Thyroid Function Tests: No results for input(s): TSH, T4TOTAL, FREET4, T3FREE,  THYROIDAB in the last 72 hours. Anemia Panel: No results for input(s): VITAMINB12, FOLATE, FERRITIN, TIBC, IRON, RETICCTPCT in the last 72 hours. Urine analysis:    Component Value Date/Time   COLORURINE YELLOW 08/20/2019 [place] 08/20/2019 1305   LABSPEC 1.014 08/20/2019 1305   PHURINE 6.0 08/20/2019 1305   GLUCOSEU NEGATIVE 08/20/2019 1305   HGBUR MODERATE (A) 08/20/2019 1305   BILIRUBINUR NEGATIVE 08/20/2019 1305   KETONESUR 20 (A) 08/20/2019 1305   PROTEINUR NEGATIVE 08/20/2019 1305   NITRITE NEGATIVE 08/20/2019 1305   LEUKOCYTESUR NEGATIVE 08/20/2019 1305   Sepsis Labs: _0 (procalcitonin:4,lacticidven:4) ) Recent Results (from the past 240 hour(s))  Blood Culture (routine x 2)     Status: None (Preliminary result)   Collection Time: 08/19/19  6:27 PM   Specimen: BLOOD  Result Value Ref Range Status   Specimen Description   Final    BLOOD RIGHT ANTECUBITAL Performed at [facility], [address], [place], [place] [address]    Special Requests   Final    BOTTLES DRAWN AEROBIC AND ANAEROBIC Blood Culture adequate volume Performed at [facility], [address] [place], [place] [address]    Culture  Setup Time   Final    GRAM POSITIVE COCCI AEROBIC BOTTLE ONLY Gram Stain Report Called to,Read Back By and Verified With: MADISON T. AT [place] AT [id number] ON [id number] BY THOMPSON  S. Organism ID to follow CRITICAL RESULT CALLED TO, READ BACK BY AND VERIFIED WITH: Tillman Sers [facility] 08/20/19 2005 JDW Performed at [facility], [place] [address] [place], [place] [address]    Culture GRAM POSITIVE COCCI  Final   Report Status PENDING  Incomplete  Blood Culture ID Panel (Reflexed)     Status: Abnormal   Collection Time: 08/19/19  6:27 PM  Result Value Ref Range Status   Enterococcus species NOT DETECTED NOT DETECTED Final   Listeria monocytogenes NOT DETECTED NOT DETECTED Final   Staphylococcus species DETECTED (A) NOT DETECTED Final    Comment: CRITICAL RESULT CALLED TO, READ BACK BY AND VERIFIED WITH: Tillman Sers [facility] 08/20/19 2005 JDW    Staphylococcus aureus (BCID) DETECTED (A) NOT DETECTED Final    Comment: Methicillin (oxacillin) susceptible Staphylococcus aureus (MSSA). Preferred therapy is anti staphylococcal beta lactam antibiotic (Cefazolin or Nafcillin), unless clinically contraindicated. CRITICAL RESULT CALLED TO, READ BACK BY AND VERIFIED WITH: Tillman Sers [facility] 08/20/19 2005 JDW    Methicillin resistance NOT DETECTED NOT DETECTED Final   Streptococcus species NOT DETECTED NOT DETECTED Final   Streptococcus agalactiae NOT DETECTED NOT DETECTED Final   Streptococcus pneumoniae NOT DETECTED NOT DETECTED Final   Streptococcus pyogenes NOT DETECTED NOT DETECTED Final   Acinetobacter baumannii NOT DETECTED NOT DETECTED Final   Enterobacteriaceae species NOT DETECTED NOT DETECTED Final   Enterobacter cloacae complex NOT DETECTED NOT DETECTED Final   Escherichia coli NOT DETECTED NOT DETECTED Final   Klebsiella oxytoca NOT DETECTED NOT DETECTED Final   Klebsiella pneumoniae NOT DETECTED NOT DETECTED Final   Proteus species NOT DETECTED NOT DETECTED Final   Serratia marcescens NOT DETECTED NOT DETECTED Final   Haemophilus influenzae NOT DETECTED NOT DETECTED Final   Neisseria meningitidis NOT DETECTED NOT DETECTED Final   Pseudomonas aeruginosa NOT DETECTED NOT  DETECTED Final   Candida albicans NOT DETECTED NOT DETECTED Final   Candida glabrata NOT DETECTED NOT DETECTED Final   Candida krusei NOT DETECTED NOT DETECTED Final   Candida parapsilosis NOT DETECTED NOT DETECTED Final   Candida tropicalis  NOT DETECTED NOT DETECTED Final    Comment: Performed at [facility], [place] [address], [place], [place] [address]  Blood Culture (routine x 2)     Status: None (Preliminary result)   Collection Time: 08/19/19  7:13 PM   Specimen: BLOOD  Result Value Ref Range Status   Specimen Description BLOOD LEFT ANTECUBITAL  Final   Special Requests   Final    BOTTLES DRAWN AEROBIC AND ANAEROBIC Blood Culture adequate volume   Culture   Final    NO GROWTH 2 DAYS Performed at [facility], [address], [place], [place] [address]    Report Status PENDING  Incomplete  Urine culture     Status: None   Collection Time: 08/19/19  8:00 PM   Specimen: In/Out Cath Urine  Result Value Ref Range Status   Specimen Description   Final    IN/OUT CATH URINE Performed at [facility], [address], [place], [place] [address]    Special Requests   Final    NONE Performed at [facility], [address], [place], [place] [address]    Culture   Final    Multiple bacterial morphotypes present, none predominant. Suggest appropriate recollection if clinically indicated.   Report Status 08/21/2019 FINAL  Final  SARS Coronavirus 2 [facility] order, Performed in [facility] hospital lab) Nasopharyngeal Nasopharyngeal Swab     Status: None   Collection Time: 08/19/19  8:11 PM   Specimen: Nasopharyngeal Swab  Result Value Ref Range Status   SARS Coronavirus 2 NEGATIVE NEGATIVE Final    Comment: (NOTE) If result is NEGATIVE SARS-CoV-2 target nucleic acids are NOT DETECTED. The SARS-CoV-2 RNA is generally detectable in upper and lower  respiratory specimens during the acute phase of infection. The lowest  concentration of SARS-CoV-2 viral copies this assay can detect is  250  copies / mL. A negative result does not preclude SARS-CoV-2 infection  and should not be used as the sole basis for treatment or other  patient management decisions.  A negative result may occur with  improper specimen collection / handling, submission of specimen other  than nasopharyngeal swab, presence of viral mutation(s) within the  areas targeted by this assay, and inadequate number of viral copies  (<250 copies / mL). A negative result must be combined with clinical  observations, patient history, and epidemiological information. If result is POSITIVE SARS-CoV-2 target nucleic acids are DETECTED. The SARS-CoV-2 RNA is generally detectable in upper and lower  respiratory specimens dur ing the acute phase of infection.  Positive  results are indicative of active infection with SARS-CoV-2.  Clinical  correlation with patient history and other diagnostic information is  necessary to determine patient infection status.  Positive results do  not rule out bacterial infection or co-infection with other viruses. If result is PRESUMPTIVE POSTIVE SARS-CoV-2 nucleic acids MAY BE PRESENT.   A presumptive positive result was obtained on the submitted specimen  and confirmed on repeat testing.  While 2019 novel coronavirus  (SARS-CoV-2) nucleic acids may be present in the submitted sample  additional confirmatory testing may be necessary for epidemiological  and / or clinical management purposes  to differentiate between  SARS-CoV-2 and other Sarbecovirus currently known to infect humans.  If clinically indicated additional testing with an alternate test  methodology [id number]) is advised. The SARS-CoV-2 RNA is generally  detectable in upper and lower respiratory sp ecimens during the acute  phase of infection. The expected result is Negative. Fact Sheet  for Patients:  StrictlyIdeas.no Fact Sheet for Healthcare  Providers: BankingDealers.co.za This test is not yet approved or cleared by the [place] FDA and has been authorized for detection and/or diagnosis of SARS-CoV-2 by FDA under an Emergency Use Authorization (EUA).  This EUA will remain in effect (meaning this test can be used) for the duration of the COVID-19 declaration under Section 564(b)(1) of the Act, 21 U.S.C. section 360bbb-3(b)(1), unless the authorization is terminated or revoked sooner. Performed at [facility], [address], [place], [place] [address]          Radiology Studies: Dg Tibia/fibula Right  Result Date: 08/19/2019 CLINICAL DATA:  Soft tissue infection EXAM: RIGHT TIBIA AND FIBULA - 2 VIEW COMPARISON:  None. FINDINGS: There is no acute displaced fracture. No dislocation. The soft tissues are grossly unremarkable. There may be an intra-articular loose body in the anterior joint space of the knee. IMPRESSION: Negative. Electronically Signed   By: Constance Holster M.D.   On: 08/19/2019 19:31   Dg Chest Port 1 View  Result Date: 08/19/2019 CLINICAL DATA:  Foot infection EXAM: PORTABLE CHEST 1 VIEW COMPARISON:  Chest x-ray dated 10/10/2015 FINDINGS: There are chronic lung changes bilaterally with likely underlying emphysematous changes and scattered areas of scarring and atelectasis. There is no pneumothorax. No large pleural effusion. The heart size is stable. The aorta may be slightly dilated but is otherwise unremarkable. The lungs appear hyperexpanded. There is no acute osseous abnormality detected on this exam. There is a rounded density projecting over the left mid lung zone which is stable from prior study and is favored to represent a nipple shadow IMPRESSION: No active disease. Electronically Signed   By: Constance Holster M.D.   On: 08/19/2019 19:25   Dg Foot Complete Right  Result Date: 08/19/2019 CLINICAL DATA:  Foot infection. EXAM: RIGHT FOOT COMPLETE - 3+ VIEW COMPARISON:  None.  FINDINGS: There is osseous destruction of the distal phalanx of the first digit with an associated soft tissue defect. There is destruction or amputation of the distal phalanx of the second digit. There appears to be cortical erosions involving the middle phalanx of the second digit. Multiple ulcerations are noted along the medial aspect of the foot. There is a plantar calcaneal spur. There are degenerative changes of the midfoot. IMPRESSION: 1. Findings concerning for osteomyelitis involving the distal phalanx of the first digit. 2. Possible osteomyelitis involving the middle phalanx of the second digit. The majority of the distal phalanx of the second digit is absent which may be postsurgical or post infectious in etiology. 3. Multiple soft tissue ulcers are noted involving the medial aspect of the foot. Electronically Signed   By: Constance Holster M.D.   On: 08/19/2019 19:29   Vas [place] Lower Extremity Saphenous Vein Mapping  Result Date: 08/21/2019 LOWER EXTREMITY VEIN MAPPING Indications: Pre-op  Comparison Study: No prior Performing Technologist: Oda Cogan RDMS, RVT  Examination Guidelines: A complete evaluation includes B-mode imaging, spectral Doppler, color Doppler, and power Doppler as needed of all accessible portions of each vessel. Bilateral testing is considered an integral part of a complete examination. Limited examinations for reoccurring indications may be performed as noted. +---------------+-----------+----------------------+---------------+-----------+    RT Diameter   RT Findings          GSV             LT Diameter   LT Findings        (cm)                                               (  cm)                    +---------------+-----------+----------------------+---------------+-----------+       0.41                       Saphenofemoral          0.59                                                        [place]                                      +---------------+-----------+----------------------+---------------+-----------+       0.37                       Proximal thigh          0.36                    +---------------+-----------+----------------------+---------------+-----------+       0.24                         Mid thigh             0.20        branching   +---------------+-----------+----------------------+---------------+-----------+       0.30                        Distal thigh           0.26        branching   +---------------+-----------+----------------------+---------------+-----------+       0.27                            Knee               0.25                    +---------------+-----------+----------------------+---------------+-----------+       0.28                         Prox calf             0.31                    +---------------+-----------+----------------------+---------------+-----------+       0.22        branching         Mid calf             0.20        branching   +---------------+-----------+----------------------+---------------+-----------+       0.25                        Distal calf            0.27                    +---------------+-----------+----------------------+---------------+-----------+       0.22        branching  Ankle               0.31                    +---------------+-----------+----------------------+---------------+-----------+    Preliminary       Scheduled Meds:  aspirin EC  81 mg Oral Daily   clopidogrel  75 mg Oral Q breakfast   enoxaparin (LOVENOX) injection  40 mg Subcutaneous Q24H   rosuvastatin  10 mg Oral q1800   sodium chloride flush  3 mL Intravenous Q12H   Continuous Infusions:  sodium chloride 250 mL (08/20/19 0608)   sodium chloride      ceFAZolin (ANCEF) IV 2 g (08/21/19 1407)     LOS: 2 days      Debbe Odea, MD [facility] Pager: [email or web address] Password [email or web address] 08/21/2019, 5:05 PM

## 2019-08-22 ENCOUNTER — Inpatient Hospital Stay (HOSPITAL_COMMUNITY): Payer: Medicare Other

## 2019-08-22 DIAGNOSIS — R7881 Bacteremia: Secondary | ICD-10-CM

## 2019-08-22 DIAGNOSIS — Z01810 Encounter for preprocedural cardiovascular examination: Secondary | ICD-10-CM

## 2019-08-22 LAB — CBC WITH DIFFERENTIAL/PLATELET
Abs Immature Granulocytes: 0.03 10*3/uL (ref 0.00–0.07)
Basophils Absolute: 0 10*3/uL (ref 0.0–0.1)
Basophils Relative: 1 %
Eosinophils Absolute: 0.1 10*3/uL (ref 0.0–0.5)
Eosinophils Relative: 1 %
HCT: 36.6 % — ABNORMAL LOW (ref 39.0–52.0)
Hemoglobin: 12.1 g/dL — ABNORMAL LOW (ref 13.0–17.0)
Immature Granulocytes: 0 %
Lymphocytes Relative: 19 %
Lymphs Abs: 1.5 10*3/uL (ref 0.7–4.0)
MCH: 29.2 pg (ref 26.0–34.0)
MCHC: 33.1 g/dL (ref 30.0–36.0)
MCV: 88.2 fL (ref 80.0–100.0)
Monocytes Absolute: 0.9 10*3/uL (ref 0.1–1.0)
Monocytes Relative: 12 %
Neutro Abs: 5.4 10*3/uL (ref 1.7–7.7)
Neutrophils Relative %: 67 %
Platelets: 158 10*3/uL (ref 150–400)
RBC: 4.15 MIL/uL — ABNORMAL LOW (ref 4.22–5.81)
RDW: 13.7 % (ref 11.5–15.5)
WBC: 8 10*3/uL (ref 4.0–10.5)
nRBC: 0 % (ref 0.0–0.2)

## 2019-08-22 LAB — BASIC METABOLIC PANEL
Anion gap: 9 (ref 5–15)
BUN: 6 mg/dL — ABNORMAL LOW (ref 8–23)
CO2: 27 mmol/L (ref 22–32)
Calcium: 8.7 mg/dL — ABNORMAL LOW (ref 8.9–10.3)
Chloride: 104 mmol/L (ref 98–111)
Creatinine, Ser: 0.72 mg/dL (ref 0.61–1.24)
GFR calc Af Amer: >60 mL/min (ref >60)
GFR calc non Af Amer: >60 mL/min (ref >60)
Glucose, Bld: 106 mg/dL — ABNORMAL HIGH (ref 70–99)
Potassium: 3.7 mmol/L (ref 3.5–5.1)
Sodium: 140 mmol/L (ref 135–145)

## 2019-08-22 LAB — CULTURE, BLOOD (ROUTINE X 2): Special Requests: ADEQUATE

## 2019-08-22 LAB — GLUCOSE, CAPILLARY
Glucose-Capillary: 117 mg/dL — ABNORMAL HIGH (ref 70–99)
Glucose-Capillary: 137 mg/dL — ABNORMAL HIGH (ref 70–99)
Glucose-Capillary: 121 mg/dL — ABNORMAL HIGH (ref 70–99)
Glucose-Capillary: 139 mg/dL — ABNORMAL HIGH (ref 70–99)

## 2019-08-22 NOTE — Plan of Care (Signed)
Poc progressing.  

## 2019-08-22 NOTE — Progress Notes (Signed)
ABI's have been completed. Preliminary results can be found in CV Proc through chart review.   08/22/19 9:50 AM Raymond Walker RVT

## 2019-08-22 NOTE — Progress Notes (Signed)
PROGRESS NOTE    Raymond Radon.   WFU:[id number]  DOB: 02-22-1951  DOA: 08/19/2019 PCP: [name], MD   Brief Narrative:  Raymond Radon. is a 50 with a history of  tobacco abuse, glucose intolerance, orchiectomy and vasectomy, erectile dysfunction, nephrolithiasis who is not on any medications at home comes to the ED because he has been dealing with blackening and ulcers on the toes of his right foot which have been progressing for about 2 months now. He has not seen his PCP for them.  In the ED temperature was 102.8, heart rate 111, respiration 22, lactic acid 2.3, WBC count 12.4.  Pedal pulses were not able to be found with Dopplers.   Subjective: The patient is complaining of severe pain in his right foot and wants anything to help knock him out.  He admits to smoking 1 pack of cigarettes per day.    Assessment & Plan:   Principal Problem: Right foot gangrene and cellulitis severe sepsis- fever tachycardia, leukocytosis - lactic acid 2.3, CRP 19.1, ESR 24 -has poor blood flow to right foot- unable to doppler a pulse -I have requested a ortho and vascular surgery consult - vascular has started aspirin and crestor  -   IV Morphine and Oxycodone helping control pain - 9/3 > Right common iliac stent (8 mm x 59 mm VBX) Right external iliac angioplasty with stent placement (pre-dilation with 5 mm Mustang, 8 mm x 80 mm self-expanding Innova, post angioplastied with a 7 mm Mustang) - plan for right femoral bypass on Tues along with amputation - ID has changed Vanc, Flagyl, Cefepime to Ancef - ID recommends postponing the procedure if possible due to bacteremia - continues to be febrile  Active Problems:  Bacteremia - gram + cocci in 1 set of blood cultures from [facility]-   ID was auto-consulted  - cultures repeated by ID   Hematuria - "large" Hb on UA- he had and I and O caht which likely caused the hematuria - h/o nephrolithiasis per CT imaging- he does not  complaint of flank pain - repeat UA showing less RBCs  HTN - Metoprolol started by admitting doc for coronary calcifications seen on CT     Coronary artery calcification seen on CAT scan - no c/o chest pain - an ECHO was ordered by admitting doctor and he was started on Metoprolol- follow     COPD (chronic obstructive pulmonary disease)? 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 - need PFTs- needs to quit smoking    Tobacco abuse - have advised him to quit especially in light of his gangrene- he states he understands - Nicotine patch ordered  Time spent in minutes: 35 DVT prophylaxis: Lovenox Code Status: Full code Family Communication:  Disposition Plan: cont to treat current illness-  Consultants:   Ortho  Vascular surgery Procedures:   none Antimicrobials:  Anti-infectives (From admission, onward)   Start     Dose/Rate Route Frequency Ordered Stop   08/21/19 1400  ceFAZolin (ANCEF) IVPB 2g/100 mL premix     2 g 200 mL/hr over 30 Minutes Intravenous Every 8 hours 08/21/19 1117     08/20/19 0800  vancomycin (VANCOCIN) IVPB 750 mg/150 ml premix  Status:  Discontinued     750 mg 150 mL/hr over 60 Minutes Intravenous Every  12 hours 08/19/19 2152 08/21/19 1116   08/19/19 2300  ceFEPIme (MAXIPIME) 2 g in sodium chloride 0.9 % 100 mL IVPB  Status:  Discontinued     2 g 200 mL/hr over 30 Minutes Intravenous Every 8 hours 08/19/19 2152 08/21/19 1117   08/19/19 2200  metroNIDAZOLE (FLAGYL) tablet 500 mg  Status:  Discontinued     500 mg Oral Every 8 hours 08/19/19 2049 08/21/19 1117   08/19/19 1845  vancomycin (VANCOCIN) IVPB 1000 mg/200 mL premix     1,000 mg 200 mL/hr over 60 Minutes Intravenous  Once 08/19/19 1835 08/19/19 2128   08/19/19 1845  piperacillin-tazobactam (ZOSYN) IVPB 3.375 g      3.375 g 100 mL/hr over 30 Minutes Intravenous  Once 08/19/19 1835 08/19/19 2128       Objective: Vitals:   08/21/19 2230 08/22/19 0351 08/22/19 1240 08/22/19 1331  BP:  (!) 146/96 (!) 143/81   Pulse:   84   Resp:   19   Temp: (!) 101.2 F (38.4 C) 98.3 F (36.8 C) (!) 101.5 F (38.6 C) 100 F (37.8 C)  TempSrc: Oral Oral Oral   SpO2:   97%   Weight:      Height:        Intake/Output Summary (Last 24 hours) at 08/22/2019 1450 Last data filed at 08/22/2019 1335 Gross per 24 hour  Intake 550 ml  Output 1200 ml  Net -650 ml   Filed Weights   08/19/19 2130 08/20/19 0052 08/20/19 0621  Weight: 63.5 kg 63.8 kg 63.6 kg    Examination: General exam: Appears comfortable  HEENT: PERRLA, oral mucosa moist, no sclera icterus or thrush Respiratory system: Clear to auscultation. Respiratory effort normal. Cardiovascular system: S1 & S2 heard,  No murmurs  Gastrointestinal system: Abdomen soft, non-tender, nondistended. Normal bowel sounds   Central nervous system: Alert and oriented. No focal neurological deficits. Extremities: No cyanosis, clubbing or edema Skin: No rashes or ulcers Psychiatry:  Mood & affect appropriate.      Data Reviewed: I have personally reviewed following labs and imaging studies  CBC: Recent Labs  Lab 08/19/19 1827 08/21/19 0253 08/22/19 0356  WBC 12.4* 9.6 8.0  NEUTROABS 10.0* 7.3 5.4  HGB 15.6 12.4* 12.1*  HCT 48.8 37.6* 36.6*  MCV 90.7 88.3 88.2  PLT 188 142* 599   Basic Metabolic Panel: Recent Labs  Lab 08/19/19 1827 08/21/19 0253 08/22/19 0356  NA 138 139 140  K 3.5 3.3* 3.7  CL 99 106 104  CO2 '28 24 27  ' GLUCOSE 91 98 106*  BUN 13 7* 6*  CREATININE 0.79 0.68 0.72  CALCIUM 9.2 8.3* 8.7*   GFR: Estimated Creatinine Clearance: 78.4 mL/min (by C-G formula based on SCr of 0.72 mg/dL). Liver Function Tests: Recent Labs  Lab 08/19/19 1827  AST 10*  ALT 7  ALKPHOS 96  BILITOT 0.6  PROT 8.7*  ALBUMIN 4.0   No results for  input(s): LIPASE, AMYLASE in the last 168 hours. No results for input(s): AMMONIA in the last 168 hours. Coagulation Profile: Recent Labs  Lab 08/19/19 1827  INR 1.1   Cardiac Enzymes: No results for input(s): CKTOTAL, CKMB, CKMBINDEX, TROPONINI in the last 168 hours. BNP (last 3 results) No results for input(s): PROBNP in the last 8760 hours. HbA1C: Recent Labs    08/19/19 1954  HGBA1C 5.9*   CBG: Recent Labs  Lab 08/21/19 1151 08/21/19 1632 08/21/19 2210 08/22/19 0648 08/22/19 1106  GLUCAP 131* 90 104* 117*  137*   Lipid Profile: No results for input(s): CHOL, HDL, LDLCALC, TRIG, CHOLHDL, LDLDIRECT in the last 72 hours. Thyroid Function Tests: No results for input(s): TSH, T4TOTAL, FREET4, T3FREE, THYROIDAB in the last 72 hours. Anemia Panel: No results for input(s): VITAMINB12, FOLATE, FERRITIN, TIBC, IRON, RETICCTPCT in the last 72 hours. Urine analysis:    Component Value Date/Time   COLORURINE YELLOW 08/20/2019 [place] 08/20/2019 1305   LABSPEC 1.014 08/20/2019 1305   PHURINE 6.0 08/20/2019 1305   GLUCOSEU NEGATIVE 08/20/2019 1305   HGBUR MODERATE (A) 08/20/2019 1305   BILIRUBINUR NEGATIVE 08/20/2019 1305   KETONESUR 20 (A) 08/20/2019 1305   PROTEINUR NEGATIVE 08/20/2019 1305   NITRITE NEGATIVE 08/20/2019 1305   LEUKOCYTESUR NEGATIVE 08/20/2019 1305   Sepsis Labs: '@LABRCNTIP' (procalcitonin:4,lacticidven:4) ) Recent Results (from the past 240 hour(s))  Blood Culture (routine x 2)     Status: Abnormal   Collection Time: 08/19/19  6:27 PM   Specimen: BLOOD  Result Value Ref Range Status   Specimen Description   Final    BLOOD RIGHT ANTECUBITAL Performed at [facility], [address], [place], [place] [address]    Special Requests   Final    BOTTLES DRAWN AEROBIC AND ANAEROBIC Blood Culture adequate volume Performed at [facility], [address], [place], [place] [address]    Culture  Setup Time   Final    GRAM POSITIVE  COCCI AEROBIC BOTTLE ONLY Gram Stain Report Called to,Read Back By and Verified With: MADISON T. AT [place] AT [id number] ON [id number] BY THOMPSON S. Organism ID to follow CRITICAL RESULT CALLED TO, READ BACK BY AND VERIFIED WITH: Tillman Sers [facility] 08/20/19 2005 JDW Performed at [facility], [place] [address], [place],  [address]    Culture STAPHYLOCOCCUS AUREUS (A)  Final   Report Status 08/22/2019 FINAL  Final   Organism ID, Bacteria STAPHYLOCOCCUS AUREUS  Final      Susceptibility   Staphylococcus aureus - MIC*    CIPROFLOXACIN <=0.5 SENSITIVE Sensitive     ERYTHROMYCIN <=0.25 SENSITIVE Sensitive     GENTAMICIN <=0.5 SENSITIVE Sensitive     OXACILLIN 0.5 SENSITIVE Sensitive     TETRACYCLINE <=1 SENSITIVE Sensitive     VANCOMYCIN 1 SENSITIVE Sensitive     TRIMETH/SULFA <=10 SENSITIVE Sensitive     CLINDAMYCIN <=0.25 SENSITIVE Sensitive     RIFAMPIN <=0.5 SENSITIVE Sensitive     Inducible Clindamycin NEGATIVE Sensitive     * STAPHYLOCOCCUS AUREUS  Blood Culture ID Panel (Reflexed)     Status: Abnormal   Collection Time: 08/19/19  6:27 PM  Result Value Ref Range Status   Enterococcus species NOT DETECTED NOT DETECTED Final   Listeria monocytogenes NOT DETECTED NOT DETECTED Final   Staphylococcus species DETECTED (A) NOT DETECTED Final    Comment: CRITICAL RESULT CALLED TO, READ BACK BY AND VERIFIED WITH: Tillman Sers [facility] 08/20/19 2005 JDW    Staphylococcus aureus (BCID) DETECTED (A) NOT DETECTED Final    Comment: Methicillin (oxacillin) susceptible Staphylococcus aureus (MSSA). Preferred therapy is anti staphylococcal beta lactam antibiotic (Cefazolin or Nafcillin), unless clinically contraindicated. CRITICAL RESULT CALLED TO, READ BACK BY AND VERIFIED WITH: Tillman Sers [facility] 08/20/19 2005 JDW    Methicillin resistance NOT DETECTED NOT DETECTED Final   Streptococcus species NOT DETECTED NOT DETECTED Final   Streptococcus agalactiae NOT DETECTED NOT DETECTED Final   Streptococcus pneumoniae  NOT DETECTED NOT DETECTED Final   Streptococcus pyogenes NOT DETECTED NOT DETECTED Final   Acinetobacter  baumannii NOT DETECTED NOT DETECTED Final   Enterobacteriaceae species NOT DETECTED NOT DETECTED Final   Enterobacter cloacae complex NOT DETECTED NOT DETECTED Final   Escherichia coli NOT DETECTED NOT DETECTED Final   Klebsiella oxytoca NOT DETECTED NOT DETECTED Final   Klebsiella pneumoniae NOT DETECTED NOT DETECTED Final   Proteus species NOT DETECTED NOT DETECTED Final   Serratia marcescens NOT DETECTED NOT DETECTED Final   Haemophilus influenzae NOT DETECTED NOT DETECTED Final   Neisseria meningitidis NOT DETECTED NOT DETECTED Final   Pseudomonas aeruginosa NOT DETECTED NOT DETECTED Final   Candida albicans NOT DETECTED NOT DETECTED Final   Candida glabrata NOT DETECTED NOT DETECTED Final   Candida krusei NOT DETECTED NOT DETECTED Final   Candida parapsilosis NOT DETECTED NOT DETECTED Final   Candida tropicalis NOT DETECTED NOT DETECTED Final    Comment: Performed at [facility], [employer] [address], [place], [place] [address]  Blood Culture (routine x 2)     Status: None (Preliminary result)   Collection Time: 08/19/19  7:13 PM   Specimen: BLOOD  Result Value Ref Range Status   Specimen Description BLOOD LEFT ANTECUBITAL  Final   Special Requests   Final    BOTTLES DRAWN AEROBIC AND ANAEROBIC Blood Culture adequate volume   Culture   Final    NO GROWTH 3 DAYS Performed at [facility], [address] [place], [place] [address]    Report Status PENDING  Incomplete  Urine culture     Status: None   Collection Time: 08/19/19  8:00 PM   Specimen: In/Out Cath Urine  Result Value Ref Range Status   Specimen Description   Final    IN/OUT CATH URINE Performed at [facility], [address], [place], [place] [address]    Special Requests   Final    NONE Performed at [facility], [address], [place], [place] [address]    Culture   Final    Multiple bacterial  morphotypes present, none predominant. Suggest appropriate recollection if clinically indicated.   Report Status 08/21/2019 FINAL  Final  SARS Coronavirus 2 [facility] order, Performed in [facility] hospital lab) Nasopharyngeal Nasopharyngeal Swab     Status: None   Collection Time: 08/19/19  8:11 PM   Specimen: Nasopharyngeal Swab  Result Value Ref Range Status   SARS Coronavirus 2 NEGATIVE NEGATIVE Final    Comment: (NOTE) If result is NEGATIVE SARS-CoV-2 target nucleic acids are NOT DETECTED. The SARS-CoV-2 RNA is generally detectable in upper and lower  respiratory specimens during the acute phase of infection. The lowest  concentration of SARS-CoV-2 viral copies this assay can detect is 250  copies / mL. A negative result does not preclude SARS-CoV-2 infection  and should not be used as the sole basis for treatment or other  patient management decisions.  A negative result may occur with  improper specimen collection / handling, submission of specimen other  than nasopharyngeal swab, presence of viral mutation(s) within the  areas targeted by this assay, and inadequate number of viral copies  (<250 copies / mL). A negative result must be combined with clinical  observations, patient history, and epidemiological information. If result is POSITIVE SARS-CoV-2 target nucleic acids are DETECTED. The SARS-CoV-2 RNA is generally detectable in upper and lower  respiratory specimens dur ing the acute phase of infection.  Positive  results are indicative of active infection with SARS-CoV-2.  Clinical  correlation with patient history and other diagnostic information is  necessary to  determine patient infection status.  Positive results do  not rule out bacterial infection or co-infection with other viruses. If result is PRESUMPTIVE POSTIVE SARS-CoV-2 nucleic acids MAY BE PRESENT.   A presumptive positive result was obtained on the submitted specimen  and confirmed on repeat testing.   While 2019 novel coronavirus  (SARS-CoV-2) nucleic acids may be present in the submitted sample  additional confirmatory testing may be necessary for epidemiological  and / or clinical management purposes  to differentiate between  SARS-CoV-2 and other Sarbecovirus currently known to infect humans.  If clinically indicated additional testing with an alternate test  methodology [phone]) is advised. The SARS-CoV-2 RNA is generally  detectable in upper and lower respiratory sp ecimens during the acute  phase of infection. The expected result is Negative. Fact Sheet for Patients:  StrictlyIdeas.no Fact Sheet for Healthcare Providers: BankingDealers.co.za This test is not yet approved or cleared by the [place] FDA and has been authorized for detection and/or diagnosis of SARS-CoV-2 by FDA under an Emergency Use Authorization (EUA).  This EUA will remain in effect (meaning this test can be used) for the duration of the COVID-19 declaration under Section 564(b)(1) of the Act, 21 U.S.C. section 360bbb-3(b)(1), unless the authorization is terminated or revoked sooner. Performed at [facility], [address], [place], [place] [address]   Culture, blood (routine x 2)     Status: None (Preliminary result)   Collection Time: 08/21/19 11:34 AM   Specimen: BLOOD RIGHT ARM  Result Value Ref Range Status   Specimen Description BLOOD RIGHT ARM  Final   Special Requests   Final    BOTTLES DRAWN AEROBIC AND ANAEROBIC Blood Culture results may not be optimal due to an excessive volume of blood received in culture bottles   Culture   Final    NO GROWTH 1 DAY Performed at [facility], [place] [address], [place], [place] [address]    Report Status PENDING  Incomplete  Culture, blood (routine x 2)     Status: None (Preliminary result)   Collection Time: 08/21/19 11:34 AM   Specimen: BLOOD LEFT HAND  Result Value Ref Range Status   Specimen  Description BLOOD LEFT HAND  Final   Special Requests   Final    BOTTLES DRAWN AEROBIC AND ANAEROBIC Blood Culture adequate volume   Culture   Final    NO GROWTH 1 DAY Performed at [facility], [place] [address], [place], [place] [address]    Report Status PENDING  Incomplete         Radiology Studies: Vas [place] Abi With/wo Tbi  Result Date: 08/22/2019 LOWER EXTREMITY DOPPLER STUDY Indications: Gangrene, and peripheral artery disease. High Risk Factors: Diabetes, coronary artery disease.  Limitations: Today's exam was limited due to an open wound and involuntary              patient movement. Comparison Study: No prior studies. Performing Technologist: Carlos Levering Rvt  Examination Guidelines: A complete evaluation includes at minimum, Doppler waveform signals and systolic blood pressure reading at the level of bilateral brachial, anterior tibial, and posterior tibial arteries, when vessel segments are accessible. Bilateral testing is considered an integral part of a complete examination. Photoelectric Plethysmograph (PPG) waveforms and toe systolic pressure readings are included as required and additional duplex testing as needed. Limited examinations for reoccurring indications may be performed as noted.  ABI Findings: +--------+------------------+-----+----------+--------+  Right    Rt Pressure (mmHg) Index Waveform   Comment   +--------+------------------+-----+----------+--------+  Brachial 98                       monophasic           +--------+------------------+-----+----------+--------+  PTA      86                 0.49  monophasic           +--------+------------------+-----+----------+--------+  DP       91                 0.52  monophasic           +--------+------------------+-----+----------+--------+ +--------+------------------+-----+----------+-------+  Left     Lt Pressure (mmHg) Index Waveform   Comment  +--------+------------------+-----+----------+-------+  Brachial 174                       triphasic           +--------+------------------+-----+----------+-------+  PTA      97                 0.56  monophasic          +--------+------------------+-----+----------+-------+  DP       64                 0.37  monophasic          +--------+------------------+-----+----------+-------+ +-------+-----------+-----------+------------+------------+  ABI/TBI Today's ABI Today's TBI Previous ABI Previous TBI  +-------+-----------+-----------+------------+------------+  Right   0.52                                               +-------+-----------+-----------+------------+------------+  Left    0.56                                               +-------+-----------+-----------+------------+------------+  Summary: Right: Resting right ankle-brachial index indicates moderate right lower extremity arterial disease. Unable to obtain TBI due to toe wounds. Left: Resting left ankle-brachial index indicates moderate left lower extremity arterial disease. Unable to obtain TBI due to patient movement.  *See table(s) above for measurements and observations.    Preliminary    Vas [place] Lower Extremity Saphenous Vein Mapping  Result Date: 08/22/2019 LOWER EXTREMITY VEIN MAPPING Indications: Pre-op  Comparison Study: No prior Performing Technologist: Oda Cogan RDMS, RVT  Examination Guidelines: A complete evaluation includes B-mode imaging, spectral Doppler, color Doppler, and power Doppler as needed of all accessible portions of each vessel. Bilateral testing is considered an integral part of a complete examination. Limited examinations for reoccurring indications may be performed as noted. +---------------+-----------+----------------------+---------------+-----------+    RT Diameter   RT Findings          GSV             LT Diameter   LT Findings        (cm)                                               (cm)                     +---------------+-----------+----------------------+---------------+-----------+  0.41                       Saphenofemoral          0.59                                                        Junction                                     +---------------+-----------+----------------------+---------------+-----------+       0.37                       Proximal thigh          0.36                    +---------------+-----------+----------------------+---------------+-----------+       0.24                         Mid thigh             0.20        branching   +---------------+-----------+----------------------+---------------+-----------+       0.30                        Distal thigh           0.26        branching   +---------------+-----------+----------------------+---------------+-----------+       0.27                            Knee               0.25                    +---------------+-----------+----------------------+---------------+-----------+       0.28                         Prox calf             0.31                    +---------------+-----------+----------------------+---------------+-----------+       0.22        branching         Mid calf             0.20        branching   +---------------+-----------+----------------------+---------------+-----------+       0.25                        Distal calf            0.27                    +---------------+-----------+----------------------+---------------+-----------+       0.22        branching          Ankle               0.31                    +---------------+-----------+----------------------+---------------+-----------+  Diagnosing physician: Harold Barban MD Electronically signed by Harold Barban MD on 08/22/2019 at 1:28:19 AM.    Final       Scheduled Meds:  aspirin EC  81 mg Oral Daily   clopidogrel  75 mg Oral Q breakfast   enoxaparin (LOVENOX) injection  40 mg Subcutaneous Q24H   rosuvastatin  10 mg Oral q1800   sodium  chloride flush  3 mL Intravenous Q12H   Continuous Infusions:  sodium chloride 250 mL (08/20/19 0608)   sodium chloride      ceFAZolin (ANCEF) IV 2 g (08/22/19 1426)     LOS: 3 days      Debbe Odea, MD [facility] Pager: [email or web address] Password [email or web address] 08/22/2019, 2:50 PM

## 2019-08-23 LAB — CBC WITH DIFFERENTIAL/PLATELET
Abs Immature Granulocytes: 0.02 10*3/uL (ref 0.00–0.07)
Basophils Absolute: 0 10*3/uL (ref 0.0–0.1)
Basophils Relative: 0 %
Eosinophils Absolute: 0 10*3/uL (ref 0.0–0.5)
Eosinophils Relative: 1 %
HCT: 33.9 % — ABNORMAL LOW (ref 39.0–52.0)
Hemoglobin: 11.3 g/dL — ABNORMAL LOW (ref 13.0–17.0)
Immature Granulocytes: 0 %
Lymphocytes Relative: 18 %
Lymphs Abs: 1.3 10*3/uL (ref 0.7–4.0)
MCH: 29.4 pg (ref 26.0–34.0)
MCHC: 33.3 g/dL (ref 30.0–36.0)
MCV: 88.1 fL (ref 80.0–100.0)
Monocytes Absolute: 0.9 10*3/uL (ref 0.1–1.0)
Monocytes Relative: 12 %
Neutro Abs: 5 10*3/uL (ref 1.7–7.7)
Neutrophils Relative %: 69 %
Platelets: 154 10*3/uL (ref 150–400)
RBC: 3.85 MIL/uL — ABNORMAL LOW (ref 4.22–5.81)
RDW: 13.6 % (ref 11.5–15.5)
WBC: 7.2 10*3/uL (ref 4.0–10.5)
nRBC: 0 % (ref 0.0–0.2)

## 2019-08-23 LAB — GLUCOSE, CAPILLARY
Glucose-Capillary: 92 mg/dL (ref 70–99)
Glucose-Capillary: 139 mg/dL — ABNORMAL HIGH (ref 70–99)
Glucose-Capillary: 122 mg/dL — ABNORMAL HIGH (ref 70–99)

## 2019-08-23 LAB — BASIC METABOLIC PANEL
Anion gap: 10 (ref 5–15)
BUN: 5 mg/dL — ABNORMAL LOW (ref 8–23)
CO2: 27 mmol/L (ref 22–32)
Calcium: 8.4 mg/dL — ABNORMAL LOW (ref 8.9–10.3)
Chloride: 100 mmol/L (ref 98–111)
Creatinine, Ser: 0.66 mg/dL (ref 0.61–1.24)
GFR calc Af Amer: >60 mL/min (ref >60)
GFR calc non Af Amer: >60 mL/min (ref >60)
Glucose, Bld: 92 mg/dL (ref 70–99)
Potassium: 3.5 mmol/L (ref 3.5–5.1)
Sodium: 137 mmol/L (ref 135–145)

## 2019-08-23 NOTE — Progress Notes (Signed)
Verbal order received from Paola MD to dc CBG.

## 2019-08-23 NOTE — Progress Notes (Addendum)
PROGRESS NOTE    Raymond Walker.   JKK:[id number]  DOB: Sep 25, 1951  DOA: 08/19/2019 PCP: Babs Sciara, MD   Brief Narrative:  Raymond Walker. is a 11 with a history of  tobacco abuse, glucose intolerance, orchiectomy and vasectomy, erectile dysfunction, nephrolithiasis who is not on any medications at home comes to the ED because he has been dealing with blackening and ulcers on the toes of his right foot which have been progressing for about 2 months now. He has not seen his PCP for them.  In the ED temperature was 102.8, heart rate 111, respiration 22, lactic acid 2.3, WBC count 12.4.  Pedal pulses were not able to be found with Dopplers.   Subjective: No complaints.   Assessment & Plan:   Principal Problem: Right foot gangrene and cellulitis severe sepsis- fever tachycardia, leukocytosis - lactic acid 2.3, CRP 19.1, ESR 24 -has poor blood flow to right foot- unable to doppler a pulse -I have requested a ortho and vascular surgery consult - vascular has started aspirin and crestor  -   IV Morphine and Oxycodone helping control pain - 9/3 > Right common iliac stent (8 mm x 59 mm VBX) Right external iliac angioplasty with stent placement (pre-dilation with 5 mm Mustang, 8 mm x 80 mm self-expanding Innova, post angioplastied with a 7 mm Mustang) - plan for right femoral bypass on Tues along with amputation - ID has changed Vanc, Flagyl, Cefepime to Ancef - ID recommends postponing the procedure if possible due to bacteremia - fever curve improving  Active Problems:  Bacteremia- MSSA - in 1 set of blood [facility] from [facility]-   ID was auto-consulted  - cultures repeated by ID - neg so far  Hematuria - "large" Hb on UA- he had and I and O caht which likely caused the hematuria - h/o nephrolithiasis per CT imaging- he does not complaint of flank pain - repeat UA showing less RBCs  HTN - Metoprolol started by admitting doc for coronary calcifications seen on CT      Coronary artery calcification seen on CAT scan - no c/o chest pain - an ECHO was ordered by admitting doctor and he was started on Metoprolol- follow     COPD (chronic obstructive pulmonary disease)? 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 - need PFTs- needs to quit smoking    Tobacco abuse - have advised him to quit especially in light of his gangrene- he states he understands - Nicotine patch ordered  Time spent in minutes: 35 DVT prophylaxis: Lovenox Code Status: Full code Family Communication:  Disposition Plan: cont to treat current illness-  Consultants:   Ortho  Vascular surgery Procedures:   none Antimicrobials:  Anti-infectives (From admission, onward)   Start     Dose/Rate Route Frequency Ordered Stop   08/21/19 1400  ceFAZolin (ANCEF) IVPB 2g/100 mL premix     2 g 200 mL/hr over 30 Minutes Intravenous Every 8 hours 08/21/19 1117     08/20/19 0800  vancomycin (VANCOCIN) IVPB 750 mg/150 ml premix  Status:  Discontinued     750 mg 150 mL/hr over 60 Minutes Intravenous Every 12 hours 08/19/19 2152 08/21/19 1116   08/19/19 2300  ceFEPIme (MAXIPIME) 2 g in sodium chloride 0.9 % 100 mL IVPB  Status:  Discontinued  2 g 200 mL/hr over 30 Minutes Intravenous Every 8 hours 08/19/19 2152 08/21/19 1117   08/19/19 2200  metroNIDAZOLE (FLAGYL) tablet 500 mg  Status:  Discontinued     500 mg Oral Every 8 hours 08/19/19 2049 08/21/19 1117   08/19/19 1845  vancomycin (VANCOCIN) IVPB 1000 mg/200 mL premix     1,000 mg 200 mL/hr over 60 Minutes Intravenous  Once 08/19/19 1835 08/19/19 2128   08/19/19 1845  piperacillin-tazobactam (ZOSYN) IVPB 3.375 g     3.375 g 100 mL/hr over 30 Minutes Intravenous  Once 08/19/19 1835 08/19/19 2128       Objective: Vitals:   08/23/19 0300 08/23/19 0846  08/23/19 0847 08/23/19 1129  BP: (!) 146/68 128/65  130/67  Pulse: 67 (!) 106 77 (!) 41  Resp: 20 15 16 17   Temp: 98.9 F (37.2 C) 99.3 F (37.4 C)  99.4 F (37.4 C)  TempSrc: Oral Oral  Oral  SpO2: 99% (!) 86% 93% 98%  Weight:      Height:        Intake/Output Summary (Last 24 hours) at 08/23/2019 1421 Last data filed at 08/23/2019 0600 Gross per 24 hour  Intake 300 ml  Output 200 ml  Net 100 ml   Filed Weights   08/19/19 2130 08/20/19 0052 08/20/19 0621  Weight: 63.5 kg 63.8 kg 63.6 kg    Examination:  General exam: Appears comfortable  HEENT: PERRLA, oral mucosa moist, no sclera icterus or thrush Respiratory system: Clear to auscultation. Respiratory effort normal. Cardiovascular system: S1 & S2 heard,  No murmurs  Gastrointestinal system: Abdomen soft, non-tender, nondistended. Normal bowel sounds   Central nervous system: Alert and oriented. No focal neurological deficits. Extremities: No cyanosis, clubbing or edema Skin: see below Psychiatry:  Mood & affect appropriate.       Data Reviewed: I have personally reviewed following labs and imaging studies  CBC: Recent Labs  Lab 08/19/19 1827 08/21/19 0253 08/22/19 0356 08/23/19 0356  WBC 12.4* 9.6 8.0 7.2  NEUTROABS 10.0* 7.3 5.4 5.0  HGB 15.6 12.4* 12.1* 11.3*  HCT 48.8 37.6* 36.6* 33.9*  MCV 90.7 88.3 88.2 88.1  PLT 188 142* 158 154   Basic Metabolic Panel: Recent Labs  Lab 08/19/19 1827 08/21/19 0253 08/22/19 0356 08/23/19 0356  NA 138 139 140 137  K 3.5 3.3* 3.7 3.5  CL 99 106 104 100  CO2 28 24 27 27   GLUCOSE 91 98 106* 92  BUN 13 7* 6* 5*  CREATININE 0.79 0.68 0.72 0.66  CALCIUM 9.2 8.3* 8.7* 8.4*   GFR: Estimated Creatinine Clearance: 78.4 mL/min (by C-G formula based on SCr of 0.66 mg/dL). Liver Function Tests: Recent Labs  Lab 08/19/19 1827  AST 10*  ALT 7  ALKPHOS 96  BILITOT 0.6  PROT 8.7*  ALBUMIN 4.0   No results for input(s): LIPASE, AMYLASE in the last 168 hours. No  results for input(s): AMMONIA in the last 168 hours. Coagulation Profile: Recent Labs  Lab 08/19/19 1827  INR 1.1   Cardiac Enzymes: No results for input(s): CKTOTAL, CKMB, CKMBINDEX, TROPONINI in the last 168 hours. BNP (last 3 results) No results for input(s): PROBNP in the last 8760 hours. HbA1C: No results for input(s): HGBA1C in the last 72 hours. CBG: Recent Labs  Lab 08/22/19 1106 08/22/19 1605 08/22/19 2115 08/23/19 0635 08/23/19 1128  GLUCAP 137* 121* 139* 92 139*   Lipid Profile: No results for input(s): CHOL, HDL, LDLCALC, TRIG, CHOLHDL, LDLDIRECT in the last  72 hours. Thyroid Function Tests: No results for input(s): TSH, T4TOTAL, FREET4, T3FREE, THYROIDAB in the last 72 hours. Anemia Panel: No results for input(s): VITAMINB12, FOLATE, FERRITIN, TIBC, IRON, RETICCTPCT in the last 72 hours. Urine analysis:    Component Value Date/Time   COLORURINE YELLOW 08/20/2019 1305   APPEARANCEUR CLEAR 08/20/2019 1305   LABSPEC 1.014 08/20/2019 1305   PHURINE 6.0 08/20/2019 1305   GLUCOSEU NEGATIVE 08/20/2019 1305   HGBUR MODERATE (A) 08/20/2019 1305   BILIRUBINUR NEGATIVE 08/20/2019 1305   KETONESUR 20 (A) 08/20/2019 1305   PROTEINUR NEGATIVE 08/20/2019 1305   NITRITE NEGATIVE 08/20/2019 1305   LEUKOCYTESUR NEGATIVE 08/20/2019 1305   Sepsis Labs: @LABRCNTIP (procalcitonin:4,lacticidven:4) ) Recent Results (from the past 240 hour(s))  Blood Culture (routine x 2)     Status: Abnormal   Collection Time: 08/19/19  6:27 PM   Specimen: BLOOD  Result Value Ref Range Status   Specimen Description   Final    BLOOD RIGHT ANTECUBITAL Performed at [facility], [address] [place], [place] [address]    Special Requests   Final    BOTTLES DRAWN AEROBIC AND ANAEROBIC Blood Culture adequate volume Performed at [facility], [address], [place], [place] [address]    Culture  Setup Time   Final    GRAM POSITIVE COCCI AEROBIC BOTTLE ONLY Gram Stain Report Called  to,Read Back By and Verified With: MADISON T. AT [facility] AT 1121A ON [id number] BY THOMPSON S. Organism ID to follow CRITICAL RESULT CALLED TO, READ BACK BY AND VERIFIED WITH: Kelby Fam [facility] 08/20/19 2005 JDW Performed at [facility], [address], [place], [place] [address]    Culture STAPHYLOCOCCUS AUREUS (A)  Final   Report Status 08/22/2019 FINAL  Final   Organism ID, Bacteria STAPHYLOCOCCUS AUREUS  Final      Susceptibility   Staphylococcus aureus - MIC*    CIPROFLOXACIN <=0.5 SENSITIVE Sensitive     ERYTHROMYCIN <=0.25 SENSITIVE Sensitive     GENTAMICIN <=0.5 SENSITIVE Sensitive     OXACILLIN 0.5 SENSITIVE Sensitive     TETRACYCLINE <=1 SENSITIVE Sensitive     VANCOMYCIN 1 SENSITIVE Sensitive     TRIMETH/SULFA <=10 SENSITIVE Sensitive     CLINDAMYCIN <=0.25 SENSITIVE Sensitive     RIFAMPIN <=0.5 SENSITIVE Sensitive     Inducible Clindamycin NEGATIVE Sensitive     * STAPHYLOCOCCUS AUREUS  Blood Culture ID Panel (Reflexed)     Status: Abnormal   Collection Time: 08/19/19  6:27 PM  Result Value Ref Range Status   Enterococcus species NOT DETECTED NOT DETECTED Final   Listeria monocytogenes NOT DETECTED NOT DETECTED Final   Staphylococcus species DETECTED (A) NOT DETECTED Final    Comment: CRITICAL RESULT CALLED TO, READ BACK BY AND VERIFIED WITH: Kelby Fam [facility] 08/20/19 2005 JDW    Staphylococcus aureus (BCID) DETECTED (A) NOT DETECTED Final    Comment: Methicillin (oxacillin) susceptible Staphylococcus aureus (MSSA). Preferred therapy is anti staphylococcal beta lactam antibiotic (Cefazolin or Nafcillin), unless clinically contraindicated. CRITICAL RESULT CALLED TO, READ BACK BY AND VERIFIED WITH: Kelby Fam [facility] 08/20/19 2005 JDW    Methicillin resistance NOT DETECTED NOT DETECTED Final   Streptococcus species NOT DETECTED NOT DETECTED Final   Streptococcus agalactiae NOT DETECTED NOT DETECTED Final   Streptococcus pneumoniae NOT DETECTED NOT DETECTED Final   Streptococcus  pyogenes NOT DETECTED NOT DETECTED Final   Acinetobacter baumannii NOT DETECTED NOT DETECTED Final   Enterobacteriaceae species NOT DETECTED NOT DETECTED Final   Enterobacter  cloacae complex NOT DETECTED NOT DETECTED Final   Escherichia coli NOT DETECTED NOT DETECTED Final   Klebsiella oxytoca NOT DETECTED NOT DETECTED Final   Klebsiella pneumoniae NOT DETECTED NOT DETECTED Final   Proteus species NOT DETECTED NOT DETECTED Final   Serratia marcescens NOT DETECTED NOT DETECTED Final   Haemophilus influenzae NOT DETECTED NOT DETECTED Final   Neisseria meningitidis NOT DETECTED NOT DETECTED Final   Pseudomonas aeruginosa NOT DETECTED NOT DETECTED Final   Candida albicans NOT DETECTED NOT DETECTED Final   Candida glabrata NOT DETECTED NOT DETECTED Final   Candida krusei NOT DETECTED NOT DETECTED Final   Candida parapsilosis NOT DETECTED NOT DETECTED Final   Candida tropicalis NOT DETECTED NOT DETECTED Final    Comment: Performed at [facility], [address] [place], [place] [address]  Blood Culture (routine x 2)     Status: None (Preliminary result)   Collection Time: 08/19/19  7:13 PM   Specimen: BLOOD  Result Value Ref Range Status   Specimen Description BLOOD LEFT ANTECUBITAL  Final   Special Requests   Final    BOTTLES DRAWN AEROBIC AND ANAEROBIC Blood Culture adequate volume   Culture   Final    NO GROWTH 4 DAYS Performed at [facility], [address], [place], [place] [address]    Report Status PENDING  Incomplete  Urine culture     Status: None   Collection Time: 08/19/19  8:00 PM   Specimen: In/Out Cath Urine  Result Value Ref Range Status   Specimen Description   Final    IN/OUT CATH URINE Performed at [facility], [address], [place], [place] [address]    Special Requests   Final    NONE Performed at [facility], [address], [place], [place] [address]    Culture   Final    Multiple bacterial morphotypes present, none predominant. Suggest appropriate  recollection if clinically indicated.   Report Status 08/21/2019 FINAL  Final  SARS Coronavirus 2 [facility], Performed in [facility] hospital lab) Nasopharyngeal Nasopharyngeal Swab     Status: None   Collection Time: 08/19/19  8:11 PM   Specimen: Nasopharyngeal Swab  Result Value Ref Range Status   SARS Coronavirus 2 NEGATIVE NEGATIVE Final    Comment: (NOTE) If result is NEGATIVE SARS-CoV-2 target nucleic acids are NOT DETECTED. The SARS-CoV-2 RNA is generally detectable in upper and lower  respiratory specimens during the acute phase of infection. The lowest  concentration of SARS-CoV-2 viral copies this assay can detect is 250  copies / mL. A negative result does not preclude SARS-CoV-2 infection  and should not be used as the sole basis for treatment or other  patient management decisions.  A negative result may occur with  improper specimen collection / handling, submission of specimen other  than nasopharyngeal swab, presence of viral mutation(s) within the  areas targeted by this assay, and inadequate number of viral copies  (<250 copies / mL). A negative result must be combined with clinical  observations, patient history, and epidemiological information. If result is POSITIVE SARS-CoV-2 target nucleic acids are DETECTED. The SARS-CoV-2 RNA is generally detectable in upper and lower  respiratory specimens dur ing the acute phase of infection.  Positive  results are indicative of active infection with SARS-CoV-2.  Clinical  correlation with patient history and other diagnostic information is  necessary to determine patient infection status.  Positive results do  not rule out bacterial infection or co-infection with other  viruses. If result is PRESUMPTIVE POSTIVE SARS-CoV-2 nucleic acids MAY BE PRESENT.   A presumptive positive result was obtained on the submitted specimen  and confirmed on repeat testing.  While 2019 novel coronavirus  (SARS-CoV-2) nucleic acids may  be present in the submitted sample  additional confirmatory testing may be necessary for epidemiological  and / or clinical management purposes  to differentiate between  SARS-CoV-2 and other Sarbecovirus currently known to infect humans.  If clinically indicated additional testing with an alternate test  methodology [phone]) is advised. The SARS-CoV-2 RNA is generally  detectable in upper and lower respiratory sp ecimens during the acute  phase of infection. The expected result is Negative. Fact Sheet for Patients:  BoilerBrush.com.cy Fact Sheet for Healthcare Providers: https://pope.com/ This test is not yet approved or cleared by the [place] FDA and has been authorized for detection and/or diagnosis of SARS-CoV-2 by FDA under an Emergency Use Authorization (EUA).  This EUA will remain in effect (meaning this test can be used) for the duration of the COVID-19 declaration under Section 564(b)(1) of the Act, 21 U.S.C. section 360bbb-3(b)(1), unless the authorization is terminated or revoked sooner. Performed at [facility], [address], [name], [place] [address]   Culture, blood (routine x 2)     Status: None (Preliminary result)   Collection Time: 08/21/19 11:34 AM   Specimen: BLOOD RIGHT ARM  Result Value Ref Range Status   Specimen Description BLOOD RIGHT ARM  Final   Special Requests   Final    BOTTLES DRAWN AEROBIC AND ANAEROBIC Blood Culture results may not be optimal due to an excessive volume of blood received in culture bottles   Culture   Final    NO GROWTH 2 DAYS Performed at [facility], [address] [place], [place] [address]    Report Status PENDING  Incomplete  Culture, blood (routine x 2)     Status: None (Preliminary result)   Collection Time: 08/21/19 11:34 AM   Specimen: BLOOD LEFT HAND  Result Value Ref Range Status   Specimen Description BLOOD LEFT HAND  Final   Special Requests   Final     BOTTLES DRAWN AEROBIC AND ANAEROBIC Blood Culture adequate volume   Culture   Final    NO GROWTH 2 DAYS Performed at [facility], [address] [place], [place] [address]    Report Status PENDING  Incomplete         Radiology Studies: Vas [place] Abi With/wo Tbi  Result Date: 08/22/2019 LOWER EXTREMITY DOPPLER STUDY Indications: Gangrene, and peripheral artery disease. High Risk Factors: Diabetes, coronary artery disease.  Limitations: Today's exam was limited due to an open wound and involuntary              patient movement. Comparison Study: No prior studies. Performing Technologist: Olen Cordial Rvt  Examination Guidelines: A complete evaluation includes at minimum, Doppler waveform signals and systolic blood pressure reading at the level of bilateral brachial, anterior tibial, and posterior tibial arteries, when vessel segments are accessible. Bilateral testing is considered an integral part of a complete examination. Photoelectric Plethysmograph (PPG) waveforms and toe systolic pressure readings are included as required and additional duplex testing as needed. Limited examinations for reoccurring indications may be performed as noted.  ABI Findings: +--------+------------------+-----+----------+--------+ Right   Rt Pressure (mmHg)IndexWaveform  Comment  +--------+------------------+-----+----------+--------+ [id number]                     monophasic         +--------+------------------+-----+----------+--------+  PTA     86                0.49 monophasic         +--------+------------------+-----+----------+--------+ DP      91                0.52 monophasic         +--------+------------------+-----+----------+--------+ +--------+------------------+-----+----------+-------+ Left    Lt Pressure (mmHg)IndexWaveform  Comment +--------+------------------+-----+----------+-------+ [id number]                    triphasic          +--------+------------------+-----+----------+-------+ PTA     97                0.56 monophasic        +--------+------------------+-----+----------+-------+ DP      64                0.37 monophasic        +--------+------------------+-----+----------+-------+ +-------+-----------+-----------+------------+------------+ ABI/TBIToday's ABIToday's TBIPrevious ABIPrevious TBI +-------+-----------+-----------+------------+------------+ Right  0.52                                           +-------+-----------+-----------+------------+------------+ Left   0.56                                           +-------+-----------+-----------+------------+------------+  Summary: Right: Resting right ankle-brachial index indicates moderate right lower extremity arterial disease. Unable to obtain TBI due to toe wounds. Left: Resting left ankle-brachial index indicates moderate left lower extremity arterial disease. Unable to obtain TBI due to patient movement.  *See table(s) above for measurements and observations.    Preliminary       Scheduled Meds: . aspirin EC  81 mg Oral Daily  . clopidogrel  75 mg Oral Q breakfast  . enoxaparin (LOVENOX) injection  40 mg Subcutaneous Q24H  . rosuvastatin  10 mg Oral q1800  . sodium chloride flush  3 mL Intravenous Q12H   Continuous Infusions: . sodium chloride 250 mL (08/20/19 1308)  . sodium chloride    .  ceFAZolin (ANCEF) IV 2 g (08/23/19 0658)     LOS: 4 days      Calvert Cantor, MD [facility] Pager: [email or web address] Password [email or web address] 08/23/2019, 2:21 PM

## 2019-08-24 LAB — CBC WITH DIFFERENTIAL/PLATELET
Abs Immature Granulocytes: 0.04 10*3/uL (ref 0.00–0.07)
Basophils Absolute: 0 10*3/uL (ref 0.0–0.1)
Basophils Relative: 0 %
Eosinophils Absolute: 0.1 10*3/uL (ref 0.0–0.5)
Eosinophils Relative: 1 %
HCT: 33 % — ABNORMAL LOW (ref 39.0–52.0)
Hemoglobin: 11 g/dL — ABNORMAL LOW (ref 13.0–17.0)
Immature Granulocytes: 1 %
Lymphocytes Relative: 18 %
Lymphs Abs: 1.5 10*3/uL (ref 0.7–4.0)
MCH: 29 pg (ref 26.0–34.0)
MCHC: 33.3 g/dL (ref 30.0–36.0)
MCV: 87.1 fL (ref 80.0–100.0)
Monocytes Absolute: 0.9 10*3/uL (ref 0.1–1.0)
Monocytes Relative: 10 %
Neutro Abs: 5.7 10*3/uL (ref 1.7–7.7)
Neutrophils Relative %: 70 %
Platelets: 155 10*3/uL (ref 150–400)
RBC: 3.79 MIL/uL — ABNORMAL LOW (ref 4.22–5.81)
RDW: 13.2 % (ref 11.5–15.5)
WBC: 8.1 10*3/uL (ref 4.0–10.5)
nRBC: 0 % (ref 0.0–0.2)

## 2019-08-24 LAB — CULTURE, BLOOD (ROUTINE X 2)
Culture: NO GROWTH
Special Requests: ADEQUATE

## 2019-08-24 LAB — BASIC METABOLIC PANEL
Anion gap: 9 (ref 5–15)
BUN: 10 mg/dL (ref 8–23)
CO2: 29 mmol/L (ref 22–32)
Calcium: 8.1 mg/dL — ABNORMAL LOW (ref 8.9–10.3)
Chloride: 97 mmol/L — ABNORMAL LOW (ref 98–111)
Creatinine, Ser: 0.67 mg/dL (ref 0.61–1.24)
GFR calc Af Amer: >60 mL/min (ref >60)
GFR calc non Af Amer: >60 mL/min (ref >60)
Glucose, Bld: 132 mg/dL — ABNORMAL HIGH (ref 70–99)
Potassium: 3.1 mmol/L — ABNORMAL LOW (ref 3.5–5.1)
Sodium: 135 mmol/L (ref 135–145)

## 2019-08-24 MED ORDER — POTASSIUM CHLORIDE CRYS ER 20 MEQ PO TBCR
40.0000 meq | EXTENDED_RELEASE_TABLET | ORAL | Status: AC
  Administered 2019-08-24 (×2): 40 meq via ORAL
  Filled 2019-08-24 (×2): qty 2

## 2019-08-24 NOTE — Progress Notes (Signed)
PROGRESS NOTE    Raymond Walker.   BCW:[id number]  DOB: [birth year]  DOA: 08/19/2019 PCP: [name], MD   Brief Narrative:  Raymond Walker. is a 1 with a history of  tobacco abuse, glucose intolerance, orchiectomy and vasectomy, erectile dysfunction, nephrolithiasis who is not on any medications at home comes to the ED because he has been dealing with blackening and ulcers on the toes of his right foot which have been progressing for about 2 months now. He has not seen his PCP for them.  In the ED temperature was 102.8, heart rate 111, respiration 22, lactic acid 2.3, WBC count 12.4.  Pedal pulses were not able to be found with Dopplers.   Subjective: He has no new complaints.  Continues to have ongoing pain in the right foot.  Assessment & Plan:   Principal Problem: Right foot gangrene and cellulitis severe sepsis- fever tachycardia, leukocytosis - lactic acid 2.3, CRP 19.1, ESR 24 -has poor blood flow to right foot- unable to doppler a pulse -I have requested a ortho and vascular surgery consult - vascular has started aspirin and crestor  -   IV Morphine and Oxycodone helping control pain - 9/3 > Right common iliac stent (8 mm x 59 mm VBX) Right external iliac angioplasty with stent placement (pre-dilation with 5 mm Mustang, 8 mm x 80 mm self-expanding Innova, post angioplastied with a 7 mm Mustang) - plan for right femoral bypass on Tues along with amputation - ID has changed Vanc, Flagyl, Cefepime to Ancef - ID recommends postponing the procedure if possible due to bacteremia-I have communicated this to vascular surgery today-the patient is already aware and is in agreement   Active Problems:  Bacteremia- MSSA - in 1 set of blood cultures from [facility]-   ID was auto-consulted  - cultures repeated by ID - neg so far  Hematuria - "large" Hb on UA- he had and I and O caht which likely caused the hematuria - h/o nephrolithiasis per CT imaging- he does not  complaint of flank pain - repeat UA showing less RBCs  HTN - Metoprolol started by admitting doc for coronary calcifications seen on CT     Coronary artery calcification seen on CAT scan - no c/o chest pain - an ECHO was ordered by admitting doctor and he was started on Metoprolol- follow     COPD (chronic obstructive pulmonary disease)? 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 - need PFTs- needs to quit smoking    Tobacco abuse - have advised him to quit especially in light of his gangrene- he states he understands - Nicotine patch ordered  Time spent in minutes: 35 DVT prophylaxis: Lovenox Code Status: Full code Family Communication:  Disposition Plan: cont to treat current illness-  Consultants:   Ortho  Vascular surgery Procedures:   none Antimicrobials:  Anti-infectives (From admission, onward)   Start     Dose/Rate Route Frequency Ordered Stop   08/21/19 1400  ceFAZolin (ANCEF) IVPB 2g/100 mL premix     2 g 200 mL/hr over 30 Minutes Intravenous Every 8 hours 08/21/19 1117     08/20/19 0800  vancomycin (VANCOCIN) IVPB 750 mg/150 ml premix  Status:  Discontinued     750 mg 150 mL/hr over 60 Minutes Intravenous Every 12 hours 08/19/19 2152 08/21/19  1116   08/19/19 2300  ceFEPIme (MAXIPIME) 2 g in sodium chloride 0.9 % 100 mL IVPB  Status:  Discontinued     2 g 200 mL/hr over 30 Minutes Intravenous Every 8 hours 08/19/19 2152 08/21/19 1117   08/19/19 2200  metroNIDAZOLE (FLAGYL) tablet 500 mg  Status:  Discontinued     500 mg Oral Every 8 hours 08/19/19 2049 08/21/19 1117   08/19/19 1845  vancomycin (VANCOCIN) IVPB 1000 mg/200 mL premix     1,000 mg 200 mL/hr over 60 Minutes Intravenous  Once 08/19/19 1835 08/19/19 2128   08/19/19 1845  piperacillin-tazobactam (ZOSYN) IVPB 3.375 g      3.375 g 100 mL/hr over 30 Minutes Intravenous  Once 08/19/19 1835 08/19/19 2128       Objective: Vitals:   08/23/19 2025 08/24/19 0432 08/24/19 0955 08/24/19 1155  BP: 117/67 (!) 132/97 110/64 (!) 138/49  Pulse: 88 67  75  Resp: '20 17 16 ' (!) 21  Temp: (!) 100.5 F (38.1 C) 100 F (37.8 C)  98.7 F (37.1 C)  TempSrc: Oral Oral  Oral  SpO2: 97% 93%  98%  Weight:      Height:        Intake/Output Summary (Last 24 hours) at 08/24/2019 1405 Last data filed at 08/24/2019 0200 Gross per 24 hour  Intake 710 ml  Output 625 ml  Net 85 ml   Filed Weights   08/19/19 2130 08/20/19 0052 08/20/19 0621  Weight: 63.5 kg 63.8 kg 63.6 kg    Examination: General exam: Appears comfortable  HEENT: PERRLA, oral mucosa moist, no sclera icterus or thrush Respiratory system: Clear to auscultation. Respiratory effort normal. Cardiovascular system: S1 & S2 heard,  No murmurs  Gastrointestinal system: Abdomen soft, non-tender, nondistended. Normal bowel sounds   Central nervous system: Alert and oriented. No focal neurological deficits. Extremities: No cyanosis, clubbing or edema Skin: No rashes or ulcers Psychiatry:  Mood & affect appropriate.       Data Reviewed: I have personally reviewed following labs and imaging studies  CBC: Recent Labs  Lab 08/19/19 1827 08/21/19 0253 08/22/19 0356 08/23/19 0356 08/24/19 0055  WBC 12.4* 9.6 8.0 7.2 8.1  NEUTROABS 10.0* 7.3 5.4 5.0 5.7  HGB 15.6 12.4* 12.1* 11.3* 11.0*  HCT 48.8 37.6* 36.6* 33.9* 33.0*  MCV 90.7 88.3 88.2 88.1 87.1  PLT 188 142* 158 154 315   Basic Metabolic Panel: Recent Labs  Lab 08/19/19 1827 08/21/19 0253 08/22/19 0356 08/23/19 0356 08/24/19 0055  NA 138 139 140 137 135  K 3.5 3.3* 3.7 3.5 3.1*  CL 99 106 104 100 97*  CO2 '28 24 27 27 29  ' GLUCOSE 91 98 106* 92 132*  BUN 13 7* 6* 5* 10  CREATININE 0.79 0.68 0.72 0.66 0.67  CALCIUM 9.2 8.3* 8.7* 8.4* 8.1*   GFR: Estimated Creatinine Clearance: 78.4 mL/min  (by C-G formula based on SCr of 0.67 mg/dL). Liver Function Tests: Recent Labs  Lab 08/19/19 1827  AST 10*  ALT 7  ALKPHOS 96  BILITOT 0.6  PROT 8.7*  ALBUMIN 4.0   No results for input(s): LIPASE, AMYLASE in the last 168 hours. No results for input(s): AMMONIA in the last 168 hours. Coagulation Profile: Recent Labs  Lab 08/19/19 1827  INR 1.1   Cardiac Enzymes: No results for input(s): CKTOTAL, CKMB, CKMBINDEX, TROPONINI in the last 168 hours. BNP (last 3 results) No results for input(s): PROBNP in the last 8760 hours. HbA1C: No results for input(s):  HGBA1C in the last 72 hours. CBG: Recent Labs  Lab 08/22/19 1605 08/22/19 2115 08/23/19 0635 08/23/19 1128 08/23/19 1626  GLUCAP 121* 139* 92 139* 122*   Lipid Profile: No results for input(s): CHOL, HDL, LDLCALC, TRIG, CHOLHDL, LDLDIRECT in the last 72 hours. Thyroid Function Tests: No results for input(s): TSH, T4TOTAL, FREET4, T3FREE, THYROIDAB in the last 72 hours. Anemia Panel: No results for input(s): VITAMINB12, FOLATE, FERRITIN, TIBC, IRON, RETICCTPCT in the last 72 hours. Urine analysis:    Component Value Date/Time   COLORURINE YELLOW 08/20/2019 1305   APPEARANCEUR CLEAR 08/20/2019 1305   LABSPEC 1.014 08/20/2019 1305   PHURINE 6.0 08/20/2019 1305   GLUCOSEU NEGATIVE 08/20/2019 1305   HGBUR MODERATE (A) 08/20/2019 1305   BILIRUBINUR NEGATIVE 08/20/2019 1305   KETONESUR 20 (A) 08/20/2019 1305   PROTEINUR NEGATIVE 08/20/2019 1305   NITRITE NEGATIVE 08/20/2019 1305   LEUKOCYTESUR NEGATIVE 08/20/2019 1305   Sepsis Labs: '@LABRCNTIP' (procalcitonin:4,lacticidven:4) ) Recent Results (from the past 240 hour(s))  Blood Culture (routine x 2)     Status: Abnormal   Collection Time: 08/19/19  6:27 PM   Specimen: BLOOD  Result Value Ref Range Status   Specimen Description   Final    BLOOD RIGHT ANTECUBITAL Performed at [facility], [address], [place], [place] [address]    Special Requests   Final     BOTTLES DRAWN AEROBIC AND ANAEROBIC Blood Culture adequate volume Performed at [facility], [address] [place], [place] [address]    Culture  Setup Time   Final    GRAM POSITIVE COCCI AEROBIC BOTTLE ONLY Gram Stain Report Called to,Read Back By and Verified With: MADISON T. AT [place] AT [id number] ON [id number] BY THOMPSON S. Organism ID to follow CRITICAL RESULT CALLED TO, READ BACK BY AND VERIFIED WITH: Tillman Sers [facility] 08/20/19 2005 JDW Performed at [facility], [place] [address], [place], [place] [address]    Culture STAPHYLOCOCCUS AUREUS (A)  Final   Report Status 08/22/2019 FINAL  Final   Organism ID, Bacteria STAPHYLOCOCCUS AUREUS  Final      Susceptibility   Staphylococcus aureus - MIC*    CIPROFLOXACIN <=0.5 SENSITIVE Sensitive     ERYTHROMYCIN <=0.25 SENSITIVE Sensitive     GENTAMICIN <=0.5 SENSITIVE Sensitive     OXACILLIN 0.5 SENSITIVE Sensitive     TETRACYCLINE <=1 SENSITIVE Sensitive     VANCOMYCIN 1 SENSITIVE Sensitive     TRIMETH/SULFA <=10 SENSITIVE Sensitive     CLINDAMYCIN <=0.25 SENSITIVE Sensitive     RIFAMPIN <=0.5 SENSITIVE Sensitive     Inducible Clindamycin NEGATIVE Sensitive     * STAPHYLOCOCCUS AUREUS  Blood Culture ID Panel (Reflexed)     Status: Abnormal   Collection Time: 08/19/19  6:27 PM  Result Value Ref Range Status   Enterococcus species NOT DETECTED NOT DETECTED Final   Listeria monocytogenes NOT DETECTED NOT DETECTED Final   Staphylococcus species DETECTED (A) NOT DETECTED Final    Comment: CRITICAL RESULT CALLED TO, READ BACK BY AND VERIFIED WITH: Tillman Sers [facility] 08/20/19 2005 JDW    Staphylococcus aureus (BCID) DETECTED (A) NOT DETECTED Final    Comment: Methicillin (oxacillin) susceptible Staphylococcus aureus (MSSA). Preferred therapy is anti staphylococcal beta lactam antibiotic (Cefazolin or Nafcillin), unless clinically contraindicated. CRITICAL RESULT CALLED TO, READ BACK BY AND VERIFIED WITH: Tillman Sers [facility] 08/20/19 2005 JDW    Methicillin  resistance NOT DETECTED NOT DETECTED Final   Streptococcus species NOT DETECTED NOT DETECTED Final   Streptococcus  agalactiae NOT DETECTED NOT DETECTED Final   Streptococcus pneumoniae NOT DETECTED NOT DETECTED Final   Streptococcus pyogenes NOT DETECTED NOT DETECTED Final   Acinetobacter baumannii NOT DETECTED NOT DETECTED Final   Enterobacteriaceae species NOT DETECTED NOT DETECTED Final   Enterobacter cloacae complex NOT DETECTED NOT DETECTED Final   Escherichia coli NOT DETECTED NOT DETECTED Final   Klebsiella oxytoca NOT DETECTED NOT DETECTED Final   Klebsiella pneumoniae NOT DETECTED NOT DETECTED Final   Proteus species NOT DETECTED NOT DETECTED Final   Serratia marcescens NOT DETECTED NOT DETECTED Final   Haemophilus influenzae NOT DETECTED NOT DETECTED Final   Neisseria meningitidis NOT DETECTED NOT DETECTED Final   Pseudomonas aeruginosa NOT DETECTED NOT DETECTED Final   Candida albicans NOT DETECTED NOT DETECTED Final   Candida glabrata NOT DETECTED NOT DETECTED Final   Candida krusei NOT DETECTED NOT DETECTED Final   Candida parapsilosis NOT DETECTED NOT DETECTED Final   Candida tropicalis NOT DETECTED NOT DETECTED Final    Comment: Performed at [facility], [place] [address], [place], [place] [address]  Blood Culture (routine x 2)     Status: None   Collection Time: 08/19/19  7:13 PM   Specimen: BLOOD  Result Value Ref Range Status   Specimen Description BLOOD LEFT ANTECUBITAL  Final   Special Requests   Final    BOTTLES DRAWN AEROBIC AND ANAEROBIC Blood Culture adequate volume   Culture   Final    NO GROWTH 5 DAYS Performed at [facility], [address], [place], [place] [address]    Report Status 08/24/2019 FINAL  Final  Urine culture     Status: None   Collection Time: 08/19/19  8:00 PM   Specimen: In/Out Cath Urine  Result Value Ref Range Status   Specimen Description   Final    IN/OUT CATH URINE Performed at [facility], [address], [place],  [place] [address]    Special Requests   Final    NONE Performed at [facility], [address] [place], [place] [address]    Culture   Final    Multiple bacterial morphotypes present, none predominant. Suggest appropriate recollection if clinically indicated.   Report Status 08/21/2019 FINAL  Final  SARS Coronavirus 2 [facility] order, Performed in [facility] hospital lab) Nasopharyngeal Nasopharyngeal Swab     Status: None   Collection Time: 08/19/19  8:11 PM   Specimen: Nasopharyngeal Swab  Result Value Ref Range Status   SARS Coronavirus 2 NEGATIVE NEGATIVE Final    Comment: (NOTE) If result is NEGATIVE SARS-CoV-2 target nucleic acids are NOT DETECTED. The SARS-CoV-2 RNA is generally detectable in upper and lower  respiratory specimens during the acute phase of infection. The lowest  concentration of SARS-CoV-2 viral copies this assay can detect is 250  copies / mL. A negative result does not preclude SARS-CoV-2 infection  and should not be used as the sole basis for treatment or other  patient management decisions.  A negative result may occur with  improper specimen collection / handling, submission of specimen other  than nasopharyngeal swab, presence of viral mutation(s) within the  areas targeted by this assay, and inadequate number of viral copies  (<250 copies / mL). A negative result must be combined with clinical  observations, patient history, and epidemiological information. If result is POSITIVE SARS-CoV-2 target nucleic acids are DETECTED. The SARS-CoV-2 RNA is generally detectable in upper and lower  respiratory specimens dur ing the acute phase of infection.  Positive  results are indicative of active infection with SARS-CoV-2.  Clinical  correlation with patient history and other diagnostic information is  necessary to determine patient infection status.  Positive results do  not rule out bacterial infection or co-infection with other viruses. If result is PRESUMPTIVE  POSTIVE SARS-CoV-2 nucleic acids MAY BE PRESENT.   A presumptive positive result was obtained on the submitted specimen  and confirmed on repeat testing.  While 2019 novel coronavirus  (SARS-CoV-2) nucleic acids may be present in the submitted sample  additional confirmatory testing may be necessary for epidemiological  and / or clinical management purposes  to differentiate between  SARS-CoV-2 and other Sarbecovirus currently known to infect humans.  If clinically indicated additional testing with an alternate test  methodology [phone]) is advised. The SARS-CoV-2 RNA is generally  detectable in upper and lower respiratory sp ecimens during the acute  phase of infection. The expected result is Negative. Fact Sheet for Patients:  StrictlyIdeas.no Fact Sheet for Healthcare Providers: BankingDealers.co.za This test is not yet approved or cleared by the [place] FDA and has been authorized for detection and/or diagnosis of SARS-CoV-2 by FDA under an Emergency Use Authorization (EUA).  This EUA will remain in effect (meaning this test can be used) for the duration of the COVID-19 declaration under Section 564(b)(1) of the Act, 21 U.S.C. section 360bbb-3(b)(1), unless the authorization is terminated or revoked sooner. Performed at [facility], [address], [place], [place] [address]   Culture, blood (routine x 2)     Status: None (Preliminary result)   Collection Time: 08/21/19 11:34 AM   Specimen: BLOOD RIGHT ARM  Result Value Ref Range Status   Specimen Description BLOOD RIGHT ARM  Final   Special Requests   Final    BOTTLES DRAWN AEROBIC AND ANAEROBIC Blood Culture results may not be optimal due to an excessive volume of blood received in culture bottles   Culture   Final    NO GROWTH 3 DAYS Performed at [facility], [place] [address], [place], [place] [address]    Report Status PENDING  Incomplete  Culture, blood (routine  x 2)     Status: None (Preliminary result)   Collection Time: 08/21/19 11:34 AM   Specimen: BLOOD LEFT HAND  Result Value Ref Range Status   Specimen Description BLOOD LEFT HAND  Final   Special Requests   Final    BOTTLES DRAWN AEROBIC AND ANAEROBIC Blood Culture adequate volume   Culture   Final    NO GROWTH 3 DAYS Performed at [facility], [place] [address], [place], [place] [address]    Report Status PENDING  Incomplete         Radiology Studies: No results found.    Scheduled Meds: . aspirin EC  81 mg Oral Daily  . clopidogrel  75 mg Oral Q breakfast  . enoxaparin (LOVENOX) injection  40 mg Subcutaneous Q24H  . potassium chloride  40 mEq Oral Q4H  . rosuvastatin  10 mg Oral q1800  . sodium chloride flush  3 mL Intravenous Q12H   Continuous Infusions: . sodium chloride 250 mL (08/20/19 2876)  . sodium chloride    .  ceFAZolin (ANCEF) IV 2 g (08/24/19 0658)     LOS: 5 days      Debbe Odea, MD [facility] Pager: [email or web address] Password [email or web address] 08/24/2019, 2:05 PM

## 2019-08-24 NOTE — Plan of Care (Signed)
  Problem: Health Behavior/Discharge Planning: Goal: Ability to manage health-related needs will improve Outcome: Not Progressing   Problem: Clinical Measurements: Goal: Will remain free from infection Outcome: Not Progressing

## 2019-08-24 NOTE — Plan of Care (Signed)
Poc progressing.  

## 2019-08-24 NOTE — Progress Notes (Signed)
Called Dr. Trula Slade to ask about giving lovenox b/c patient was going to surgery. This RN was informed that the surgery had been canceled d/t + blood cultures for bacteremia. Patient complained that nobody has been by to see him from vascular. I told the patient that I would try to find out if someone could come by and see him. No promises.   Dr. Trula Slade said that Dr. Donzetta Matters will come by first thing in the morning to speak to patient. This RN was instructed to keep patient NPO and it was okay to give lovenox.

## 2019-08-25 ENCOUNTER — Encounter (HOSPITAL_COMMUNITY)
Admission: EM | Disposition: A | Discharge status: Skilled Nursing Facility | Payer: Medicare Other | Source: Home / Self Care | Attending: Internal Medicine

## 2019-08-25 DIAGNOSIS — L03115 Cellulitis of right lower limb: Secondary | ICD-10-CM

## 2019-08-25 DIAGNOSIS — I998 Other disorder of circulatory system: Secondary | ICD-10-CM

## 2019-08-25 LAB — CBC WITH DIFFERENTIAL/PLATELET
Abs Immature Granulocytes: 0.03 10*3/uL (ref 0.00–0.07)
Basophils Absolute: 0.1 10*3/uL (ref 0.0–0.1)
Basophils Relative: 1 %
Eosinophils Absolute: 0.1 10*3/uL (ref 0.0–0.5)
Eosinophils Relative: 1 %
HCT: 36.4 % — ABNORMAL LOW (ref 39.0–52.0)
Hemoglobin: 12 g/dL — ABNORMAL LOW (ref 13.0–17.0)
Immature Granulocytes: 0 %
Lymphocytes Relative: 19 %
Lymphs Abs: 1.8 10*3/uL (ref 0.7–4.0)
MCH: 29.1 pg (ref 26.0–34.0)
MCHC: 33 g/dL (ref 30.0–36.0)
MCV: 88.1 fL (ref 80.0–100.0)
Monocytes Absolute: 1 10*3/uL (ref 0.1–1.0)
Monocytes Relative: 10 %
Neutro Abs: 6.7 10*3/uL (ref 1.7–7.7)
Neutrophils Relative %: 69 %
Platelets: 188 10*3/uL (ref 150–400)
RBC: 4.13 MIL/uL — ABNORMAL LOW (ref 4.22–5.81)
RDW: 13.2 % (ref 11.5–15.5)
WBC: 9.7 10*3/uL (ref 4.0–10.5)
nRBC: 0 % (ref 0.0–0.2)

## 2019-08-25 SURGERY — BYPASS GRAFT FEMORAL-POPLITEAL ARTERY
Anesthesia: General | Laterality: Right

## 2019-08-25 MED ORDER — POLYETHYLENE GLYCOL 3350 17 G PO PACK
17.0000 g | PACK | Freq: Every day | ORAL | Status: DC
  Administered 2019-08-25 – 2019-08-28 (×4): 17 g via ORAL
  Filled 2019-08-25 (×5): qty 1

## 2019-08-25 MED ORDER — SENNA 8.6 MG PO TABS
1.0000 | ORAL_TABLET | Freq: Every day | ORAL | Status: DC
  Administered 2019-08-25 – 2019-08-29 (×5): 8.6 mg via ORAL
  Filled 2019-08-25 (×5): qty 1

## 2019-08-25 MED ORDER — BISACODYL 10 MG RE SUPP: 10.0000 mg | Freq: Every day | RECTAL | Status: DC | PRN

## 2019-08-25 NOTE — Progress Notes (Signed)
PHARMACY CONSULT NOTE FOR:  OUTPATIENT  PARENTERAL ANTIBIOTIC THERAPY (OPAT)  Indication: MSSA bacteremia Regimen: cefazolin 2g IV q8h End date: 09/18/19  IV antibiotic discharge orders are pended. To discharging provider:  please sign these orders via discharge navigator,  Select New Orders & click on the button choice - Manage This Unsigned Work.     Thank you for allowing pharmacy to be a part of this patient's care.  Arrie Senate, PharmD, BCPS Clinical Pharmacist [phone] Please check AMION for all [facility] numbers 08/25/2019

## 2019-08-25 NOTE — Progress Notes (Signed)
PT Cancellation Note  Patient Details Name: Raymond Walker. MRN: [id number] DOB: August 11, 1951   Cancelled Treatment:    Reason Eval/Treat Not Completed: Patient declined, no reason specified Pt refusing despite max encouragement for participation. Will follow up as schedule allows.   Leighton Ruff, PT, DPT  Acute Rehabilitation Services  Pager: [phone] Office: [phone]    Rudean Hitt 08/25/2019, 1:28 PM

## 2019-08-25 NOTE — Social Work (Signed)
CSW acknowledging consult for SNF placement.  Will follow for therapy recommendations needed to best determine disposition/for insurance authorization. Aware pt will also need iv abx at discharge.    Westley Hummer, MSW, [place] Work [phone]

## 2019-08-25 NOTE — Progress Notes (Signed)
[facility] for Infectious Disease  Date of Admission:  08/19/2019      Total days of antibiotics 7   Cefazolin day 5           ASSESSMENT: Raymond Walker. is a 68 y.o. male with MSSA bacteremia secondary to right ischemic foot wound/cellulitis/osteomyelitis. He has continued to have intermittent fevers ~100.8 F likely related to his ischemic foot. His blood cultures have cleared. Transthoracic echo was free of vegetations. His fem-pop and amputation has been postponed for now until his bacteremia is treated; for this would recommend 2 weeks IV cefazolin prior to implantation of graft material.  He is ready from ID perspective for a PICC line. Depending on findings intraoperatively he may require longer course of IV antibiotics.   His discharge plan at this time may be more successful at SNF prior to planned readmission for surgery. Would request to be consulted back at the time of surgery to assist with outlining further duration of treatment with IV antibiotics.   We called to check on the status of his breakfast tray - he should be receiving shortly.   PLAN: 1. Continue Cefazolin  2. OK to place PICC line  3. OPAT as outlined below    OPAT ORDERS:  Diagnosis: Ischemic foot wound/osteomyelitis, secondary bacteremia   Culture Result: MSSA (blood) no foot tissue cx  No Known Allergies  Discharge antibiotics: Cefazolin 2 gm IV q8h  Duration: 28 days (just incase surgery delay for amputation)   End Date: October 2nd  (*[facility] end date pending findings on surgery to be scheduled    [facility] Care and Maintenance Per Protocol __ Please pull PIC at completion of IV antibiotics _x_ Please leave PIC in place until doctor has seen patient or been notified  Labs weekly while on IV antibiotics: _x_ CBC with differential _x_ BMP __ BMP TWICE WEEKLY** __ CMP _x_ CRP __ ESR __ Vancomycin trough  Fax weekly labs to [phone]  Clinic Follow Up Appt:  Will consult back at upcoming hospitalization for planned fem pop/amputation      Principal Problem:   MSSA bacteremia Active Problems:   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[place])   Tobacco abuse   Coronary artery calcification seen on CAT scan   Impaired glucose tolerance   COPD (chronic obstructive pulmonary disease) (HCC)   Protein-calorie malnutrition, severe   Acute osteomyelitis of toe of right foot (HCC)   Cellulitis of right lower extremity   . aspirin EC  81 mg Oral Daily  . clopidogrel  75 mg Oral Q breakfast  . enoxaparin (LOVENOX) injection  40 mg Subcutaneous Q24H  . rosuvastatin  10 mg Oral q1800  . sodium chloride flush  3 mL Intravenous Q12H    SUBJECTIVE: Not very contributory to conversation today - angry about NPO status. He has still not gotten a breakfast tray and does not wish to discuss much else.   He does tell me that he would be unsuccessful giving his IV antibiotics at home. He may have the ability to reach out to his niece to assist but does not confirm any plan to do so.   Review of Systems: Review of Systems  Constitutional: Positive for chills and fever.  Respiratory: Negative for cough and shortness of breath.   Cardiovascular: Negative for chest pain.  Gastrointestinal: Negative for abdominal pain and diarrhea.  Genitourinary: Negative for dysuria.  Musculoskeletal: Negative for back pain, joint pain and  myalgias.    No Known Allergies  OBJECTIVE: Vitals:   08/24/19 1155 08/24/19 2111 08/25/19 0600 08/25/19 0829  BP: (!) 138/49 139/71 (!) 141/71 100/85  Pulse: 75  75 70  Resp: (!) '21 18  16  ' Temp: 98.7 F (37.1 C) 100 F (37.8 C) 98.2 F (36.8 C) 98 F (36.7 C)  TempSrc: Oral Oral Oral Oral  SpO2: 98% 97% 96% 93%  Weight:      Height:       Body mass index is 19.02 kg/m.  Physical Exam Constitutional:      Comments: Resting quietly in bed.   Cardiovascular:     Rate and Rhythm: Normal rate and regular rhythm.      Heart sounds: No murmur.  Pulmonary:     Effort: Pulmonary effort is normal.     Breath sounds: Normal breath sounds.  Skin:    General: Skin is warm and dry.  Neurological:     Mental Status: He is oriented to person, place, and time.     Lab Results Lab Results  Component Value Date   WBC 9.7 08/25/2019   HGB 12.0 (L) 08/25/2019   HCT 36.4 (L) 08/25/2019   MCV 88.1 08/25/2019   PLT 188 08/25/2019    Lab Results  Component Value Date   CREATININE 0.67 08/24/2019   BUN 10 08/24/2019   NA 135 08/24/2019   K 3.1 (L) 08/24/2019   CL 97 (L) 08/24/2019   CO2 29 08/24/2019    Lab Results  Component Value Date   ALT 7 08/19/2019   AST 10 (L) 08/19/2019   ALKPHOS 96 08/19/2019   BILITOT 0.6 08/19/2019     Microbiology: Recent Results (from the past 240 hour(s))  Blood Culture (routine x 2)     Status: Abnormal   Collection Time: 08/19/19  6:27 PM   Specimen: BLOOD  Result Value Ref Range Status   Specimen Description   Final    BLOOD RIGHT ANTECUBITAL Performed at [facility], [address], [place], [place] [address]    Special Requests   Final    BOTTLES DRAWN AEROBIC AND ANAEROBIC Blood Culture adequate volume Performed at [facility], [address] [place], [place] [address]    Culture  Setup Time   Final    GRAM POSITIVE COCCI AEROBIC BOTTLE ONLY Gram Stain Report Called to,Read Back By and Verified With: MADISON T. AT [place] AT [id number] ON [id number] BY THOMPSON S. Organism ID to follow CRITICAL RESULT CALLED TO, READ BACK BY AND VERIFIED WITH: Tillman Sers [facility] 08/20/19 2005 JDW Performed at [facility], [place] [address] [place], [place] [address]    Culture STAPHYLOCOCCUS AUREUS (A)  Final   Report Status 08/22/2019 FINAL  Final   Organism ID, Bacteria STAPHYLOCOCCUS AUREUS  Final      Susceptibility   Staphylococcus aureus - MIC*    CIPROFLOXACIN <=0.5 SENSITIVE Sensitive     ERYTHROMYCIN <=0.25 SENSITIVE Sensitive     GENTAMICIN <=0.5 SENSITIVE Sensitive      OXACILLIN 0.5 SENSITIVE Sensitive     TETRACYCLINE <=1 SENSITIVE Sensitive     VANCOMYCIN 1 SENSITIVE Sensitive     TRIMETH/SULFA <=10 SENSITIVE Sensitive     CLINDAMYCIN <=0.25 SENSITIVE Sensitive     RIFAMPIN <=0.5 SENSITIVE Sensitive     Inducible Clindamycin NEGATIVE Sensitive     * STAPHYLOCOCCUS AUREUS  Blood Culture ID Panel (Reflexed)     Status: Abnormal   Collection Time: 08/19/19  6:27  PM  Result Value Ref Range Status   Enterococcus species NOT DETECTED NOT DETECTED Final   Listeria monocytogenes NOT DETECTED NOT DETECTED Final   Staphylococcus species DETECTED (A) NOT DETECTED Final    Comment: CRITICAL RESULT CALLED TO, READ BACK BY AND VERIFIED WITH: Tillman Sers [facility] 08/20/19 2005 JDW    Staphylococcus aureus (BCID) DETECTED (A) NOT DETECTED Final    Comment: Methicillin (oxacillin) susceptible Staphylococcus aureus (MSSA). Preferred therapy is anti staphylococcal beta lactam antibiotic (Cefazolin or Nafcillin), unless clinically contraindicated. CRITICAL RESULT CALLED TO, READ BACK BY AND VERIFIED WITH: Tillman Sers [facility] 08/20/19 2005 JDW    Methicillin resistance NOT DETECTED NOT DETECTED Final   Streptococcus species NOT DETECTED NOT DETECTED Final   Streptococcus agalactiae NOT DETECTED NOT DETECTED Final   Streptococcus pneumoniae NOT DETECTED NOT DETECTED Final   Streptococcus pyogenes NOT DETECTED NOT DETECTED Final   Acinetobacter baumannii NOT DETECTED NOT DETECTED Final   Enterobacteriaceae species NOT DETECTED NOT DETECTED Final   Enterobacter cloacae complex NOT DETECTED NOT DETECTED Final   Escherichia coli NOT DETECTED NOT DETECTED Final   Klebsiella oxytoca NOT DETECTED NOT DETECTED Final   Klebsiella pneumoniae NOT DETECTED NOT DETECTED Final   Proteus species NOT DETECTED NOT DETECTED Final   Serratia marcescens NOT DETECTED NOT DETECTED Final   Haemophilus influenzae NOT DETECTED NOT DETECTED Final   Neisseria meningitidis NOT DETECTED NOT DETECTED  Final   Pseudomonas aeruginosa NOT DETECTED NOT DETECTED Final   Candida albicans NOT DETECTED NOT DETECTED Final   Candida glabrata NOT DETECTED NOT DETECTED Final   Candida krusei NOT DETECTED NOT DETECTED Final   Candida parapsilosis NOT DETECTED NOT DETECTED Final   Candida tropicalis NOT DETECTED NOT DETECTED Final    Comment: Performed at [facility], [place]. [address], [place], [place] [address]  Blood Culture (routine x 2)     Status: None   Collection Time: 08/19/19  7:13 PM   Specimen: BLOOD  Result Value Ref Range Status   Specimen Description BLOOD LEFT ANTECUBITAL  Final   Special Requests   Final    BOTTLES DRAWN AEROBIC AND ANAEROBIC Blood Culture adequate volume   Culture   Final    NO GROWTH 5 DAYS Performed at [facility], [address], [place], [place] [address]    Report Status 08/24/2019 FINAL  Final  Urine culture     Status: None   Collection Time: 08/19/19  8:00 PM   Specimen: In/Out Cath Urine  Result Value Ref Range Status   Specimen Description   Final    IN/OUT CATH URINE Performed at [facility], [address], [place], [place] [address]    Special Requests   Final    NONE Performed at [facility], [address], [place],  [address]    Culture   Final    Multiple bacterial morphotypes present, none predominant. Suggest appropriate recollection if clinically indicated.   Report Status 08/21/2019 FINAL  Final  SARS Coronavirus 2 [facility] order, Performed in [facility] hospital lab) Nasopharyngeal Nasopharyngeal Swab     Status: None   Collection Time: 08/19/19  8:11 PM   Specimen: Nasopharyngeal Swab  Result Value Ref Range Status   SARS Coronavirus 2 NEGATIVE NEGATIVE Final    Comment: (NOTE) If result is NEGATIVE SARS-CoV-2 target nucleic acids are NOT DETECTED. The SARS-CoV-2 RNA is generally detectable in upper and lower  respiratory specimens during the acute phase of infection. The lowest  concentration of SARS-CoV-2  viral  copies this assay can detect is 250  copies / mL. A negative result does not preclude SARS-CoV-2 infection  and should not be used as the sole basis for treatment or other  patient management decisions.  A negative result may occur with  improper specimen collection / handling, submission of specimen other  than nasopharyngeal swab, presence of viral mutation(s) within the  areas targeted by this assay, and inadequate number of viral copies  (<250 copies / mL). A negative result must be combined with clinical  observations, patient history, and epidemiological information. If result is POSITIVE SARS-CoV-2 target nucleic acids are DETECTED. The SARS-CoV-2 RNA is generally detectable in upper and lower  respiratory specimens dur ing the acute phase of infection.  Positive  results are indicative of active infection with SARS-CoV-2.  Clinical  correlation with patient history and other diagnostic information is  necessary to determine patient infection status.  Positive results do  not rule out bacterial infection or co-infection with other viruses. If result is PRESUMPTIVE POSTIVE SARS-CoV-2 nucleic acids MAY BE PRESENT.   A presumptive positive result was obtained on the submitted specimen  and confirmed on repeat testing.  While 2019 novel coronavirus  (SARS-CoV-2) nucleic acids may be present in the submitted sample  additional confirmatory testing may be necessary for epidemiological  and / or clinical management purposes  to differentiate between  SARS-CoV-2 and other Sarbecovirus currently known to infect humans.  If clinically indicated additional testing with an alternate test  methodology [phone]) is advised. The SARS-CoV-2 RNA is generally  detectable in upper and lower respiratory sp ecimens during the acute  phase of infection. The expected result is Negative. Fact Sheet for Patients:  StrictlyIdeas.no Fact Sheet for Healthcare Providers:  BankingDealers.co.za This test is not yet approved or cleared by the [place] FDA and has been authorized for detection and/or diagnosis of SARS-CoV-2 by FDA under an Emergency Use Authorization (EUA).  This EUA will remain in effect (meaning this test can be used) for the duration of the COVID-19 declaration under Section 564(b)(1) of the Act, 21 U.S.C. section 360bbb-3(b)(1), unless the authorization is terminated or revoked sooner. Performed at [facility], [address] [place], [place] [address]   Culture, blood (routine x 2)     Status: None (Preliminary result)   Collection Time: 08/21/19 11:34 AM   Specimen: BLOOD RIGHT ARM  Result Value Ref Range Status   Specimen Description BLOOD RIGHT ARM  Final   Special Requests   Final    BOTTLES DRAWN AEROBIC AND ANAEROBIC Blood Culture results may not be optimal due to an excessive volume of blood received in culture bottles   Culture   Final    NO GROWTH 4 DAYS Performed at [facility], [name] [address], [place], [place] [address]    Report Status PENDING  Incomplete  Culture, blood (routine x 2)     Status: None (Preliminary result)   Collection Time: 08/21/19 11:34 AM   Specimen: BLOOD LEFT HAND  Result Value Ref Range Status   Specimen Description BLOOD LEFT HAND  Final   Special Requests   Final    BOTTLES DRAWN AEROBIC AND ANAEROBIC Blood Culture adequate volume   Culture   Final    NO GROWTH 4 DAYS Performed at [facility], [place] [address], [place], [place] [address]    Report Status PENDING  Incomplete     Janene Madeira, MSN, NP-C [place] for Infectious Disease  [email or web address] Pager: [phone] Office: [phone] [place]: [phone]

## 2019-08-25 NOTE — Progress Notes (Signed)
  Progress Note    08/25/2019 11:16 AM 5 Days Post-Op  Subjective: No overnight issues  Vitals:   08/25/19 0600 08/25/19 0829  BP: (!) 141/71 100/85  Pulse: 75 70  Resp:  16  Temp: 98.2 F (36.8 C) 98 F (36.7 C)  SpO2: 96% 93%    Physical Exam: Awake alert oriented Strongly palpable right common femoral pulse Right foot with brisk capillary refill stable gangrenous changes to right toes 1 through 3  CBC    Component Value Date/Time   WBC 9.7 08/25/2019 0419   RBC 4.13 (L) 08/25/2019 0419   HGB 12.0 (L) 08/25/2019 0419   HCT 36.4 (L) 08/25/2019 0419   PLT 188 08/25/2019 0419   MCV 88.1 08/25/2019 0419   MCH 29.1 08/25/2019 0419   MCHC 33.0 08/25/2019 0419   RDW 13.2 08/25/2019 0419   LYMPHSABS 1.8 08/25/2019 0419   MONOABS 1.0 08/25/2019 0419   EOSABS 0.1 08/25/2019 0419   BASOSABS 0.1 08/25/2019 0419    BMET    Component Value Date/Time   NA 135 08/24/2019 0055   NA 141 10/11/2015 1041   K 3.1 (L) 08/24/2019 0055   CL 97 (L) 08/24/2019 0055   CO2 29 08/24/2019 0055   GLUCOSE 132 (H) 08/24/2019 0055   BUN 10 08/24/2019 0055   BUN 11 10/11/2015 1041   CREATININE 0.67 08/24/2019 0055   CREATININE 0.70 08/01/2018 1126   CALCIUM 8.1 (L) 08/24/2019 0055   GFRNONAA >60 08/24/2019 0055   GFRNONAA 97 08/01/2018 1126   GFRAA >60 08/24/2019 0055   GFRAA 112 08/01/2018 1126    INR    Component Value Date/Time   INR 1.1 08/19/2019 1827     Intake/Output Summary (Last 24 hours) at 08/25/2019 1116 Last data filed at 08/25/2019 0829 Gross per 24 hour  Intake 472.53 ml  Output 1305 ml  Net -832.47 ml     Assessment:  68 y.o. male is s/p iliac artery stenting with gangrenous toes on the right.  Positive blood cultures.  Infectious disease following.  Plan: We will plan bypass after sufficient antibiotics at the discretion of infectious disease.  Patient likely to need graft given diminutive vein on the right and will also need at least 3 toe amputations at  the time which should provide source control.   Raymond Dorey C. Donzetta Matters, MD [facility] Office: [phone] Pager: [phone]  08/25/2019 11:16 AM

## 2019-08-25 NOTE — Progress Notes (Signed)
PROGRESS NOTE    Raymond Walker.   WUJ:[id number]  DOB: 08-26-1951  DOA: 08/19/2019 PCP: [name], MD   Brief Narrative:  Raymond Walker. is a 45 with a history of  tobacco abuse, glucose intolerance, orchiectomy and vasectomy, erectile dysfunction, nephrolithiasis who is not on any medications at home comes to the ED because he has been dealing with blackening and ulcers on the toes of his right foot which have been progressing for about 2 months now. He has not seen his PCP for them.  In the ED temperature was 102.8, heart rate 111, respiration 22, lactic acid 2.3, WBC count 12.4.  Pedal pulses were not able to be found with Dopplers.  Underwent an angiogram on 9/3 and stenting.  Plan was for a right femoral bypass and then amputation of the toe however he was found to be bacteremic and ID is recommended postponing the bypass until he is received full treatment for the bacteremia for 2 weeks.   Subjective: No new complaints. Ongoing foot pain which is controlled with pain meds.   Assessment & Plan:   Principal Problem: Right foot gangrene and cellulitis severe sepsis- fever tachycardia, leukocytosis - lactic acid 2.3, CRP 19.1, ESR 24 -has poor blood flow to right foot- unable to doppler a pulse -I have requested a ortho and vascular surgery consult - vascular has started aspirin and crestor  -   IV Morphine and Oxycodone helping control pain - 9/3 > Right common iliac stent (8 mm x 59 mm VBX) Right external iliac angioplasty with stent placement (pre-dilation with 5 mm Mustang, 8 mm x 80 mm self-expanding Innova, post angioplastied with a 7 mm Mustang) - plan for right femoral bypass on Tues along with amputation - ID has changed Vanc, Flagyl, Cefepime to Ancef - ID recommends postponing the procedure if possible due to bacteremia- ID recommends 2 weeks of IV antibiotics- I will ask social work to try to find a SNF for him in the interim  Active Problems:  Bacteremia-  MSSA - in 1 set of blood cultures from [facility]-   ID was auto-consulted  - cultures repeated by ID -negative x4 days  Hematuria - "large" Hb on UA- he had and I and O caht which likely caused the hematuria - h/o nephrolithiasis per CT imaging- he does not complaint of flank pain - repeat UA showing less RBCs  HTN - Metoprolol started by admitting doc for coronary calcifications seen on CT     Coronary artery calcification seen on CAT scan - no c/o chest pain - an ECHO was ordered by admitting doctor and he was started on Metoprolol- follow     COPD (chronic obstructive pulmonary disease)? 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 - need PFTs- needs to quit smoking    Tobacco abuse - have advised him to quit especially in light of his gangrene- he states he understands - Nicotine patch ordered  Time spent in minutes: 35 DVT prophylaxis: Lovenox Code Status: Full code Family Communication:  Disposition Plan: cont to treat current illness-  Consultants:   Ortho  Vascular surgery  ID Procedures:   Angiogram and stenting Antimicrobials:  Anti-infectives (From admission, onward)   Start     Dose/Rate Route Frequency Ordered Stop   08/21/19 1400  ceFAZolin (ANCEF) IVPB  2g/100 mL premix     2 g 200 mL/hr over 30 Minutes Intravenous Every 8 hours 08/21/19 1117     08/20/19 0800  vancomycin (VANCOCIN) IVPB 750 mg/150 ml premix  Status:  Discontinued     750 mg 150 mL/hr over 60 Minutes Intravenous Every 12 hours 08/19/19 2152 08/21/19 1116   08/19/19 2300  ceFEPIme (MAXIPIME) 2 g in sodium chloride 0.9 % 100 mL IVPB  Status:  Discontinued     2 g 200 mL/hr over 30 Minutes Intravenous Every 8 hours 08/19/19 2152 08/21/19 1117   08/19/19 2200  metroNIDAZOLE (FLAGYL) tablet 500 mg  Status:   Discontinued     500 mg Oral Every 8 hours 08/19/19 2049 08/21/19 1117   08/19/19 1845  vancomycin (VANCOCIN) IVPB 1000 mg/200 mL premix     1,000 mg 200 mL/hr over 60 Minutes Intravenous  Once 08/19/19 1835 08/19/19 2128   08/19/19 1845  piperacillin-tazobactam (ZOSYN) IVPB 3.375 g     3.375 g 100 mL/hr over 30 Minutes Intravenous  Once 08/19/19 1835 08/19/19 2128       Objective: Vitals:   08/24/19 1155 08/24/19 2111 08/25/19 0600 08/25/19 0829  BP: (!) 138/49 139/71 (!) 141/71 100/85  Pulse: 75  75 70  Resp: (!) _0 Temp: 98.7 F (37.1 C) 100 F (37.8 C) 98.2 F (36.8 C) 98 F (36.7 C)  TempSrc: Oral Oral Oral Oral  SpO2: 98% 97% 96% 93%  Weight:      Height:        Intake/Output Summary (Last 24 hours) at 08/25/2019 1206 Last data filed at 08/25/2019 0829 Gross per 24 hour  Intake 472.53 ml  Output 1305 ml  Net -832.47 ml   Filed Weights   08/19/19 2130 08/20/19 0052 08/20/19 0621  Weight: 63.5 kg 63.8 kg 63.6 kg    Examination: General exam: Appears comfortable  HEENT: PERRLA, oral mucosa moist, no sclera icterus or thrush Respiratory system: Clear to auscultation. Respiratory effort normal. Cardiovascular system: S1 & S2 heard,  No murmurs  Gastrointestinal system: Abdomen soft, non-tender, nondistended. Normal bowel sounds   Central nervous system: Alert and oriented. No focal neurological deficits. Extremities: No cyanosis, clubbing or edema Skin: see below Psychiatry:  Mood & affect appropriate.       Data Reviewed: I have personally reviewed following labs and imaging studies  CBC: Recent Labs  Lab 08/21/19 0253 08/22/19 0356 08/23/19 0356 08/24/19 0055 08/25/19 0419  WBC 9.6 8.0 7.2 8.1 9.7  NEUTROABS 7.3 5.4 5.0 5.7 6.7  HGB 12.4* 12.1* 11.3* 11.0* 12.0*  HCT 37.6* 36.6* 33.9* 33.0* 36.4*  MCV 88.3 88.2 88.1 87.1 88.1  PLT 142* 158 154 155 536   Basic Metabolic Panel: Recent Labs  Lab 08/19/19 1827 08/21/19 0253 08/22/19 0356  08/23/19 0356 08/24/19 0055  NA 138 139 140 137 135  K 3.5 3.3* 3.7 3.5 3.1*  CL 99 106 104 100 97*  CO2 _1 GLUCOSE 91 98 106* 92 132*  BUN 13 7* 6* 5* 10  CREATININE 0.79 0.68 0.72 0.66 0.67  CALCIUM 9.2 8.3* 8.7* 8.4* 8.1*   GFR: Estimated Creatinine Clearance: 78.4 mL/min (by C-G formula based on SCr of 0.67 mg/dL). Liver Function Tests: Recent Labs  Lab 08/19/19 1827  AST 10*  ALT 7  ALKPHOS 96  BILITOT 0.6  PROT 8.7*  ALBUMIN 4.0   No results for input(s): LIPASE, AMYLASE in the last 168 hours. No  results for input(s): AMMONIA in the last 168 hours. Coagulation Profile: Recent Labs  Lab 08/19/19 1827  INR 1.1   Cardiac Enzymes: No results for input(s): CKTOTAL, CKMB, CKMBINDEX, TROPONINI in the last 168 hours. BNP (last 3 results) No results for input(s): PROBNP in the last 8760 hours. HbA1C: No results for input(s): HGBA1C in the last 72 hours. CBG: Recent Labs  Lab 08/22/19 1605 08/22/19 2115 08/23/19 0635 08/23/19 1128 08/23/19 1626  GLUCAP 121* 139* 92 139* 122*   Lipid Profile: No results for input(s): CHOL, HDL, LDLCALC, TRIG, CHOLHDL, LDLDIRECT in the last 72 hours. Thyroid Function Tests: No results for input(s): TSH, T4TOTAL, FREET4, T3FREE, THYROIDAB in the last 72 hours. Anemia Panel: No results for input(s): VITAMINB12, FOLATE, FERRITIN, TIBC, IRON, RETICCTPCT in the last 72 hours. Urine analysis:    Component Value Date/Time   COLORURINE YELLOW 08/20/2019 [place] 08/20/2019 1305   LABSPEC 1.014 08/20/2019 1305   PHURINE 6.0 08/20/2019 1305   GLUCOSEU NEGATIVE 08/20/2019 1305   HGBUR MODERATE (A) 08/20/2019 1305   BILIRUBINUR NEGATIVE 08/20/2019 1305   KETONESUR 20 (A) 08/20/2019 1305   PROTEINUR NEGATIVE 08/20/2019 1305   NITRITE NEGATIVE 08/20/2019 1305   LEUKOCYTESUR NEGATIVE 08/20/2019 1305   Sepsis Labs: _0 (procalcitonin:4,lacticidven:4) ) Recent Results (from the past 240 hour(s))   Blood Culture (routine x 2)     Status: Abnormal   Collection Time: 08/19/19  6:27 PM   Specimen: BLOOD  Result Value Ref Range Status   Specimen Description   Final    BLOOD RIGHT ANTECUBITAL Performed at [facility], [address] [place], [place] [address]    Special Requests   Final    BOTTLES DRAWN AEROBIC AND ANAEROBIC Blood Culture adequate volume Performed at [facility], [address] [place], [place] [address]    Culture  Setup Time   Final    GRAM POSITIVE COCCI AEROBIC BOTTLE ONLY Gram Stain Report Called to,Read Back By and Verified With: MADISON T. AT [place] AT [address] ON [id number] BY THOMPSON S. Organism ID to follow CRITICAL RESULT CALLED TO, READ BACK BY AND VERIFIED WITH: [facility] [address] [facility] 08/20/19 2005 JDW Performed at [facility], [place] [address], [place], [place] [address]    Culture STAPHYLOCOCCUS AUREUS (A)  Final   Report Status 08/22/2019 FINAL  Final   Organism ID, Bacteria STAPHYLOCOCCUS AUREUS  Final      Susceptibility   Staphylococcus aureus - MIC*    CIPROFLOXACIN <=0.5 SENSITIVE Sensitive     ERYTHROMYCIN <=0.25 SENSITIVE Sensitive     GENTAMICIN <=0.5 SENSITIVE Sensitive     OXACILLIN 0.5 SENSITIVE Sensitive     TETRACYCLINE <=1 SENSITIVE Sensitive     VANCOMYCIN 1 SENSITIVE Sensitive     TRIMETH/SULFA <=10 SENSITIVE Sensitive     CLINDAMYCIN <=0.25 SENSITIVE Sensitive     RIFAMPIN <=0.5 SENSITIVE Sensitive     Inducible Clindamycin NEGATIVE Sensitive     * STAPHYLOCOCCUS AUREUS  Blood Culture ID Panel (Reflexed)     Status: Abnormal   Collection Time: 08/19/19  6:27 PM  Result Value Ref Range Status   Enterococcus species NOT DETECTED NOT DETECTED Final   Listeria monocytogenes NOT DETECTED NOT DETECTED Final   Staphylococcus species DETECTED (A) NOT DETECTED Final    Comment: CRITICAL RESULT CALLED TO, READ BACK BY AND VERIFIED WITH: [facility] 08/20/19 2005 JDW    Staphylococcus aureus (BCID) DETECTED (A) NOT DETECTED Final     Comment: Methicillin (oxacillin)  susceptible Staphylococcus aureus (MSSA). Preferred therapy is anti staphylococcal beta lactam antibiotic (Cefazolin or Nafcillin), unless clinically contraindicated. CRITICAL RESULT CALLED TO, READ BACK BY AND VERIFIED WITH: [facility] 08/20/19 2005 JDW    Methicillin resistance NOT DETECTED NOT DETECTED Final   Streptococcus species NOT DETECTED NOT DETECTED Final   Streptococcus agalactiae NOT DETECTED NOT DETECTED Final   Streptococcus pneumoniae NOT DETECTED NOT DETECTED Final   Streptococcus pyogenes NOT DETECTED NOT DETECTED Final   Acinetobacter baumannii NOT DETECTED NOT DETECTED Final   Enterobacteriaceae species NOT DETECTED NOT DETECTED Final   Enterobacter cloacae complex NOT DETECTED NOT DETECTED Final   Escherichia coli NOT DETECTED NOT DETECTED Final   Klebsiella oxytoca NOT DETECTED NOT DETECTED Final   Klebsiella pneumoniae NOT DETECTED NOT DETECTED Final   Proteus species NOT DETECTED NOT DETECTED Final   Serratia marcescens NOT DETECTED NOT DETECTED Final   Haemophilus influenzae NOT DETECTED NOT DETECTED Final   Neisseria meningitidis NOT DETECTED NOT DETECTED Final   Pseudomonas aeruginosa NOT DETECTED NOT DETECTED Final   Candida albicans NOT DETECTED NOT DETECTED Final   Candida glabrata NOT DETECTED NOT DETECTED Final   Candida krusei NOT DETECTED NOT DETECTED Final   Candida parapsilosis NOT DETECTED NOT DETECTED Final   Candida tropicalis NOT DETECTED NOT DETECTED Final    Comment: Performed at [facility], [place]. [address] [place], [place] [address]  Blood Culture (routine x 2)     Status: None   Collection Time: 08/19/19  7:13 PM   Specimen: BLOOD  Result Value Ref Range Status   Specimen Description BLOOD LEFT ANTECUBITAL  Final   Special Requests   Final    BOTTLES DRAWN AEROBIC AND ANAEROBIC Blood Culture adequate volume   Culture   Final    NO GROWTH 5 DAYS Performed at [facility], [address]  [place], [place] [address]    Report Status 08/24/2019 FINAL  Final  Urine culture     Status: None   Collection Time: 08/19/19  8:00 PM   Specimen: In/Out Cath Urine  Result Value Ref Range Status   Specimen Description   Final    IN/OUT CATH URINE Performed at [facility], [address], [place], [place] [address]    Special Requests   Final    NONE Performed at [facility], [address] [place],  [address]    Culture   Final    Multiple bacterial morphotypes present, none predominant. Suggest appropriate recollection if clinically indicated.   Report Status 08/21/2019 FINAL  Final  SARS Coronavirus 2 [facility] order, Performed in [facility] hospital lab) Nasopharyngeal Nasopharyngeal Swab     Status: None   Collection Time: 08/19/19  8:11 PM   Specimen: Nasopharyngeal Swab  Result Value Ref Range Status   SARS Coronavirus 2 NEGATIVE NEGATIVE Final    Comment: (NOTE) If result is NEGATIVE SARS-CoV-2 target nucleic acids are NOT DETECTED. The SARS-CoV-2 RNA is generally detectable in upper and lower  respiratory specimens during the acute phase of infection. The lowest  concentration of SARS-CoV-2 viral copies this assay can detect is 250  copies / mL. A negative result does not preclude SARS-CoV-2 infection  and should not be used as the sole basis for treatment or other  patient management decisions.  A negative result may occur with  improper specimen collection / handling, submission of specimen other  than nasopharyngeal swab, presence of viral mutation(s) within the  areas targeted by this assay,  and inadequate number of viral copies  (<250 copies / mL). A negative result must be combined with clinical  observations, patient history, and epidemiological information. If result is POSITIVE SARS-CoV-2 target nucleic acids are DETECTED. The SARS-CoV-2 RNA is generally detectable in upper and lower  respiratory specimens dur ing the acute phase of infection.  Positive   results are indicative of active infection with SARS-CoV-2.  Clinical  correlation with patient history and other diagnostic information is  necessary to determine patient infection status.  Positive results do  not rule out bacterial infection or co-infection with other viruses. If result is PRESUMPTIVE POSTIVE SARS-CoV-2 nucleic acids MAY BE PRESENT.   A presumptive positive result was obtained on the submitted specimen  and confirmed on repeat testing.  While 2019 novel coronavirus  (SARS-CoV-2) nucleic acids may be present in the submitted sample  additional confirmatory testing may be necessary for epidemiological  and / or clinical management purposes  to differentiate between  SARS-CoV-2 and other Sarbecovirus currently known to infect humans.  If clinically indicated additional testing with an alternate test  methodology [phone]) is advised. The SARS-CoV-2 RNA is generally  detectable in upper and lower respiratory sp ecimens during the acute  phase of infection. The expected result is Negative. Fact Sheet for Patients:  StrictlyIdeas.no Fact Sheet for Healthcare Providers: BankingDealers.co.za This test is not yet approved or cleared by the [place] FDA and has been authorized for detection and/or diagnosis of SARS-CoV-2 by FDA under an Emergency Use Authorization (EUA).  This EUA will remain in effect (meaning this test can be used) for the duration of the COVID-19 declaration under Section 564(b)(1) of the Act, 21 U.S.C. section 360bbb-3(b)(1), unless the authorization is terminated or revoked sooner. Performed at [facility] [place], [address] [place], [place] [address]   Culture, blood (routine x 2)     Status: None (Preliminary result)   Collection Time: 08/21/19 11:34 AM   Specimen: BLOOD RIGHT ARM  Result Value Ref Range Status   Specimen Description BLOOD RIGHT ARM  Final   Special Requests   Final    BOTTLES  DRAWN AEROBIC AND ANAEROBIC Blood Culture results may not be optimal due to an excessive volume of blood received in culture bottles   Culture   Final    NO GROWTH 4 DAYS Performed at [facility], [place] [address] [place], [place] [address]    Report Status PENDING  Incomplete  Culture, blood (routine x 2)     Status: None (Preliminary result)   Collection Time: 08/21/19 11:34 AM   Specimen: BLOOD LEFT HAND  Result Value Ref Range Status   Specimen Description BLOOD LEFT HAND  Final   Special Requests   Final    BOTTLES DRAWN AEROBIC AND ANAEROBIC Blood Culture adequate volume   Culture   Final    NO GROWTH 4 DAYS Performed at [facility], [place] [address], [place], [place] [address]    Report Status PENDING  Incomplete         Radiology Studies: No results found.    Scheduled Meds: . aspirin EC  81 mg Oral Daily  . clopidogrel  75 mg Oral Q breakfast  . enoxaparin (LOVENOX) injection  40 mg Subcutaneous Q24H  . rosuvastatin  10 mg Oral q1800  . sodium chloride flush  3 mL Intravenous Q12H   Continuous Infusions: . sodium chloride 250 mL (08/20/19 6314)  . sodium chloride    .  ceFAZolin (  ANCEF) IV 2 g (08/25/19 0600)     LOS: 6 days      Debbe Odea, MD [facility] Pager: [email or web address] Password [facility] 08/25/2019, 12:06 PM

## 2019-08-25 NOTE — Progress Notes (Signed)
Received report from RN Introduce to staff, Orient to room. VSS; A/O x 4; Pain 0/10 Reviewed CP Bed Low, Alarm on Call bell within reach Will continue to monitor. 

## 2019-08-26 ENCOUNTER — Inpatient Hospital Stay: Payer: Self-pay

## 2019-08-26 LAB — CBC WITH DIFFERENTIAL/PLATELET
Abs Immature Granulocytes: 0.03 10*3/uL (ref 0.00–0.07)
Basophils Absolute: 0 10*3/uL (ref 0.0–0.1)
Basophils Relative: 1 %
Eosinophils Absolute: 0.1 10*3/uL (ref 0.0–0.5)
Eosinophils Relative: 2 %
HCT: 35.9 % — ABNORMAL LOW (ref 39.0–52.0)
Hemoglobin: 11.9 g/dL — ABNORMAL LOW (ref 13.0–17.0)
Immature Granulocytes: 0 %
Lymphocytes Relative: 18 %
Lymphs Abs: 1.5 10*3/uL (ref 0.7–4.0)
MCH: 29 pg (ref 26.0–34.0)
MCHC: 33.1 g/dL (ref 30.0–36.0)
MCV: 87.6 fL (ref 80.0–100.0)
Monocytes Absolute: 0.8 10*3/uL (ref 0.1–1.0)
Monocytes Relative: 10 %
Neutro Abs: 5.7 10*3/uL (ref 1.7–7.7)
Neutrophils Relative %: 69 %
Platelets: 180 10*3/uL (ref 150–400)
RBC: 4.1 MIL/uL — ABNORMAL LOW (ref 4.22–5.81)
RDW: 13.2 % (ref 11.5–15.5)
WBC: 8.2 10*3/uL (ref 4.0–10.5)
nRBC: 0 % (ref 0.0–0.2)

## 2019-08-26 LAB — CREATININE, SERUM
Creatinine, Ser: 0.7 mg/dL (ref 0.61–1.24)
GFR calc Af Amer: >60 mL/min (ref >60)
GFR calc non Af Amer: >60 mL/min (ref >60)

## 2019-08-26 LAB — CULTURE, BLOOD (ROUTINE X 2)
Culture: NO GROWTH
Culture: NO GROWTH
Special Requests: ADEQUATE

## 2019-08-26 MED ORDER — ADULT MULTIVITAMIN W/MINERALS CH
1.0000 | ORAL_TABLET | Freq: Every day | ORAL | Status: DC
  Administered 2019-08-26 – 2019-08-29 (×4): 1 via ORAL
  Filled 2019-08-26 (×4): qty 1

## 2019-08-26 MED ORDER — JUVEN PO PACK
1.0000 | PACK | Freq: Two times a day (BID) | ORAL | Status: DC
  Administered 2019-08-28 – 2019-08-29 (×4): 1 via ORAL
  Filled 2019-08-26 (×7): qty 1

## 2019-08-26 MED ORDER — ENSURE ENLIVE PO LIQD
237.0000 mL | Freq: Two times a day (BID) | ORAL | Status: DC
  Administered 2019-08-27 – 2019-08-28 (×4): 237 mL via ORAL

## 2019-08-26 NOTE — Progress Notes (Signed)
ID PROGRESS NOTE:   Raymond Finkle. is a 68 y.o. 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 He presented with fever/pain and was found to be bacteremic with MSSA. 1/4 bottles with likely secondary cellulitis from ischemic foot wound +/- osteomyelitis.   Discussed with Dr. Donzetta Matters regarding timing - would wait until after 9/18. He will plan surgery the week of 9/28. Would continue IV antibiotics until his surgery on presumption that he has osteomyelitis involved to some degree. Further duration to be determined after level of amputation determined. I suspect he may need a transmetatarsal amputation.    Will plan on seeing the patient back when he is re-admitted for surgery. OPAT orders still apply. Would send him to SNF with instructions to complete 4 weeks of IV cefazolin incase there is further delay in surgery.    Janene Madeira, MSN, NP-C [facility] [email or web address] Pager: [phone] Office: [phone] [place]: [phone]

## 2019-08-26 NOTE — Progress Notes (Addendum)
PROGRESS NOTE    Raymond Walker.  WGN:[id number] DOB: January 28, 1951 DOA: 08/19/2019 PCP: [name], MD   Brief Narrative: 68 year old with past medical history significant for tobacco abuse, glucose intolerance, orchiectomy and vasectomy, rectal dysfunction, nephrolithiasis who was not on any medication prior to admission presented to the ED complaining of blackening and ulcer on the toes of his right foot which has been progressing for about 2 months.  He has not seen his primary care doctor for this problem.  Evaluation in the ED patient was noted to be febrile with temperature 102 lactic acid 2.3 white count 12.  Pedal pulses were not able to be found with Dopplers.  Underwent angiogram on 9/3 and stenting.  Plan was for right femoral bypass and then amputation of the toes however he was found to have MSSA bacteremia.  ID was consulted and is recommending 2 weeks of IV antibiotics prior to bypass surgery.    Assessment & Plan:   Principal Problem:   MSSA bacteremia Active Problems:   Tobacco abuse   Coronary artery calcification seen on CAT scan   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HCC)   Impaired glucose tolerance   COPD (chronic obstructive pulmonary disease) (HCC)   Protein-calorie malnutrition, severe   Acute osteomyelitis of toe of right foot (HCC)   Cellulitis of right lower extremity  1-Foot gangrene and cellulitis with severe sepsis, fever and leukocytosis Pain 19, ESR 24. -Patient was a started on aspirin and Crestor.  He has received morphine and oxycodone for pain control. -Vascular surgery was consulted and patient underwent on 9/3 right common iliac stent, right external iliac angioplasty with stent placement. -Patient will require bypass surgery of the right femoral, along with amputation of 3 toes.  He will require 2 weeks of IV antibiotics prior to bypass surgery, because he was found to have MSSA bacteremia. -Further  IV antibiotic therapy will need to be  determined after he has his bypass -Continue with Plavix  2-MSSA bacteremia: [facility]. Cultures repeated subsequently has been negative. He will require IV antibiotics at discharge. PICC line placed.   Hematuria: Likely related to in and out cath. History of nephrolithiasis per CT imaging, he denies flank pain. Need follow-up UA as an outpatient.  Hypertension: Continue with metoprolol.  Coronary artery calcification on CT scan: Denies chest pain. Echo: Normal ejection fraction no wall motion abnormality, grade 2 diastolic dysfunction.  COPD: He will require formal pulmonary function test, as an outpatient. Prior CT in 2017 showed mild to moderate centrilobular emphysema with diffuse bronchial wall thickening suggestive of COPD  Tobacco abuse: Nicotine patch ordered.  Counseling provided  Severe malnutrition in context of chronic illness: Continue with nutrition supplements.  Nutrition Problem: Severe Malnutrition Etiology: chronic illness(COPD, pancreatitis)    Signs/Symptoms: severe fat depletion, severe muscle depletion    Interventions: Refer to RD note for recommendations  Estimated body mass index is 19.02 kg/m as calculated from the following:   Height as of this encounter: 6' (1.829 m).   Weight as of this encounter: 63.6 kg.   DVT prophylaxis: Lovenox Code Status: Full code Family Communication: Niece at bedside Disposition Plan: We will need skilled facility for continued antibiotics  Consultants:   Vascular  Procedures:  Angiogram and stenting.  ECHO; 1. The left ventricle has normal systolic function with an ejection fraction of 60-65%. The cavity size was normal. Left ventricular diastolic Doppler parameters are consistent with pseudonormalization. Elevated  mean left atrial pressure.  2. The right ventricle has normal systolic function. The cavity was normal.  3. Left atrial size was mildly dilated.   4. The mitral valve is grossly normal.  5. The tricuspid valve is grossly normal.  6. The aortic valve is tricuspid. Mild thickening of the aortic valve. No stenosis of the aortic valve.  7. The aorta is normal unless otherwise noted.  8. Normal LV systolic function; grade 2 diastolic dysfunction; mild proximal septal thickening; mild LAE.   Antimicrobials:  Ancef  Subjective: He is alert and oriented, still having pain in his leg. He is asking for his breakfast  Objective: Vitals:   08/25/19 0829 08/25/19 2105 08/26/19 0410 08/26/19 0837  BP: 100/85  91/64 (!) 129/59  Pulse: 70  75 68  Resp: 16  13   Temp: 98 F (36.7 C)  98.1 F (36.7 C)   TempSrc: Oral  Oral   SpO2: 93% 92% 93% 91%  Weight:      Height:        Intake/Output Summary (Last 24 hours) at 08/26/2019 1428 Last data filed at 08/26/2019 0450 Gross per 24 hour  Intake 120 ml  Output 775 ml  Net -655 ml   Filed Weights   08/19/19 2130 08/20/19 0052 08/20/19 0621  Weight: 63.5 kg 63.8 kg 63.6 kg    Examination:  General exam: Appears calm and comfortable  Respiratory system: Clear to auscultation. Respiratory effort normal. Cardiovascular system: S1 & S2 heard, RRR. No JVD, murmurs, rubs, gallops or clicks. No pedal edema. Gastrointestinal system: Abdomen is nondistended, soft and nontender. No organomegaly or masses felt. Normal bowel sounds heard. Central nervous system: Alert and oriented. No focal neurological deficits. Extremities: Symmetric 5 x 5 power.  Right lower extremity with dry black gangrene multiple toes of the right foot Skin: No rashes, lesions or ulcers    Data Reviewed: I have personally reviewed following labs and imaging studies  CBC: Recent Labs  Lab 08/22/19 0356 08/23/19 0356 08/24/19 0055 08/25/19 0419 08/26/19 0506  WBC 8.0 7.2 8.1 9.7 8.2  NEUTROABS 5.4 5.0 5.7 6.7 5.7  HGB 12.1* 11.3* 11.0* 12.0* 11.9*  HCT 36.6* 33.9* 33.0* 36.4* 35.9*  MCV 88.2 88.1 87.1 88.1  87.6  PLT 158 154 155 188 517   Basic Metabolic Panel: Recent Labs  Lab 08/19/19 1827 08/21/19 0253 08/22/19 0356 08/23/19 0356 08/24/19 0055 08/26/19 0506  NA 138 139 140 137 135  --   K 3.5 3.3* 3.7 3.5 3.1*  --   CL 99 106 104 100 97*  --   CO2 '28 24 27 27 29  ' --   GLUCOSE 91 98 106* 92 132*  --   BUN 13 7* 6* 5* 10  --   CREATININE 0.79 0.68 0.72 0.66 0.67 0.70  CALCIUM 9.2 8.3* 8.7* 8.4* 8.1*  --    GFR: Estimated Creatinine Clearance: 78.4 mL/min (by C-G formula based on SCr of 0.7 mg/dL). Liver Function Tests: Recent Labs  Lab 08/19/19 1827  AST 10*  ALT 7  ALKPHOS 96  BILITOT 0.6  PROT 8.7*  ALBUMIN 4.0   No results for input(s): LIPASE, AMYLASE in the last 168 hours. No results for input(s): AMMONIA in the last 168 hours. Coagulation Profile: Recent Labs  Lab 08/19/19 1827  INR 1.1   Cardiac Enzymes: No results for input(s): CKTOTAL, CKMB, CKMBINDEX, TROPONINI in the last 168 hours. BNP (last 3 results) No results for input(s): PROBNP in the last  8760 hours. HbA1C: No results for input(s): HGBA1C in the last 72 hours. CBG: Recent Labs  Lab 08/22/19 1605 08/22/19 2115 08/23/19 0635 08/23/19 1128 08/23/19 1626  GLUCAP 121* 139* 92 139* 122*   Lipid Profile: No results for input(s): CHOL, HDL, LDLCALC, TRIG, CHOLHDL, LDLDIRECT in the last 72 hours. Thyroid Function Tests: No results for input(s): TSH, T4TOTAL, FREET4, T3FREE, THYROIDAB in the last 72 hours. Anemia Panel: No results for input(s): VITAMINB12, FOLATE, FERRITIN, TIBC, IRON, RETICCTPCT in the last 72 hours. Sepsis Labs: Recent Labs  Lab 08/19/19 1827 08/19/19 1954  LATICACIDVEN 2.3* 1.9    Recent Results (from the past 240 hour(s))  Blood Culture (routine x 2)     Status: Abnormal   Collection Time: 08/19/19  6:27 PM   Specimen: BLOOD  Result Value Ref Range Status   Specimen Description   Final    BLOOD RIGHT ANTECUBITAL Performed at [facility], [address]  [place], [place] [address]    Special Requests   Final    BOTTLES DRAWN AEROBIC AND ANAEROBIC Blood Culture adequate volume Performed at [place] [place], [place] [address]    Culture  Setup Time   Final    GRAM POSITIVE COCCI AEROBIC BOTTLE ONLY Gram Stain Report Called to,Read Back By and Verified With: MADISON T. AT [place] AT [id number] ON [id number] BY THOMPSON S. Organism ID to follow CRITICAL RESULT CALLED TO, READ BACK BY AND VERIFIED WITH: Tillman Sers [facility] 08/20/19 2005 JDW Performed at [facility], [place] [address], [place], [place] [address]    Culture STAPHYLOCOCCUS AUREUS (A)  Final   Report Status 08/22/2019 FINAL  Final   Organism ID, Bacteria STAPHYLOCOCCUS AUREUS  Final      Susceptibility   Staphylococcus aureus - MIC*    CIPROFLOXACIN <=0.5 SENSITIVE Sensitive     ERYTHROMYCIN <=0.25 SENSITIVE Sensitive     GENTAMICIN <=0.5 SENSITIVE Sensitive     OXACILLIN 0.5 SENSITIVE Sensitive     TETRACYCLINE <=1 SENSITIVE Sensitive     VANCOMYCIN 1 SENSITIVE Sensitive     TRIMETH/SULFA <=10 SENSITIVE Sensitive     CLINDAMYCIN <=0.25 SENSITIVE Sensitive     RIFAMPIN <=0.5 SENSITIVE Sensitive     Inducible Clindamycin NEGATIVE Sensitive     * STAPHYLOCOCCUS AUREUS  Blood Culture ID Panel (Reflexed)     Status: Abnormal   Collection Time: 08/19/19  6:27 PM  Result Value Ref Range Status   Enterococcus species NOT DETECTED NOT DETECTED Final   Listeria monocytogenes NOT DETECTED NOT DETECTED Final   Staphylococcus species DETECTED (A) NOT DETECTED Final    Comment: CRITICAL RESULT CALLED TO, READ BACK BY AND VERIFIED WITH: Tillman Sers [facility] 08/20/19 2005 JDW    Staphylococcus aureus (BCID) DETECTED (A) NOT DETECTED Final    Comment: Methicillin (oxacillin) susceptible Staphylococcus aureus (MSSA). Preferred therapy is anti staphylococcal beta lactam antibiotic (Cefazolin or Nafcillin), unless clinically contraindicated. CRITICAL RESULT CALLED TO, READ BACK BY AND VERIFIED  WITH: Tillman Sers [facility] 08/20/19 2005 JDW    Methicillin resistance NOT DETECTED NOT DETECTED Final   Streptococcus species NOT DETECTED NOT DETECTED Final   Streptococcus agalactiae NOT DETECTED NOT DETECTED Final   Streptococcus pneumoniae NOT DETECTED NOT DETECTED Final   Streptococcus pyogenes NOT DETECTED NOT DETECTED Final   Acinetobacter baumannii NOT DETECTED NOT DETECTED Final   Enterobacteriaceae species NOT DETECTED NOT DETECTED Final   Enterobacter cloacae complex NOT DETECTED NOT DETECTED Final   Escherichia coli NOT DETECTED  NOT DETECTED Final   Klebsiella oxytoca NOT DETECTED NOT DETECTED Final   Klebsiella pneumoniae NOT DETECTED NOT DETECTED Final   Proteus species NOT DETECTED NOT DETECTED Final   Serratia marcescens NOT DETECTED NOT DETECTED Final   Haemophilus influenzae NOT DETECTED NOT DETECTED Final   Neisseria meningitidis NOT DETECTED NOT DETECTED Final   Pseudomonas aeruginosa NOT DETECTED NOT DETECTED Final   Candida albicans NOT DETECTED NOT DETECTED Final   Candida glabrata NOT DETECTED NOT DETECTED Final   Candida krusei NOT DETECTED NOT DETECTED Final   Candida parapsilosis NOT DETECTED NOT DETECTED Final   Candida tropicalis NOT DETECTED NOT DETECTED Final    Comment: Performed at [facility], [place] [address] [place], [place] [address]  Blood Culture (routine x 2)     Status: None   Collection Time: 08/19/19  7:13 PM   Specimen: BLOOD  Result Value Ref Range Status   Specimen Description BLOOD LEFT ANTECUBITAL  Final   Special Requests   Final    BOTTLES DRAWN AEROBIC AND ANAEROBIC Blood Culture adequate volume   Culture   Final    NO GROWTH 5 DAYS Performed at [facility], [address], [place], [place] [address]    Report Status 08/24/2019 FINAL  Final  Urine culture     Status: None   Collection Time: 08/19/19  8:00 PM   Specimen: In/Out Cath Urine  Result Value Ref Range Status   Specimen Description   Final    IN/OUT CATH URINE  Performed at [facility], [address] [place], [place] [address]    Special Requests   Final    NONE Performed at [facility], [address], [place], [place] [address]    Culture   Final    Multiple bacterial morphotypes present, none predominant. Suggest appropriate recollection if clinically indicated.   Report Status 08/21/2019 FINAL  Final  SARS Coronavirus 2 [facility] order, Performed in [facility] hospital lab) Nasopharyngeal Nasopharyngeal Swab     Status: None   Collection Time: 08/19/19  8:11 PM   Specimen: Nasopharyngeal Swab  Result Value Ref Range Status   SARS Coronavirus 2 NEGATIVE NEGATIVE Final    Comment: (NOTE) If result is NEGATIVE SARS-CoV-2 target nucleic acids are NOT DETECTED. The SARS-CoV-2 RNA is generally detectable in upper and lower  respiratory specimens during the acute phase of infection. The lowest  concentration of SARS-CoV-2 viral copies this assay can detect is 250  copies / mL. A negative result does not preclude SARS-CoV-2 infection  and should not be used as the sole basis for treatment or other  patient management decisions.  A negative result may occur with  improper specimen collection / handling, submission of specimen other  than nasopharyngeal swab, presence of viral mutation(s) within the  areas targeted by this assay, and inadequate number of viral copies  (<250 copies / mL). A negative result must be combined with clinical  observations, patient history, and epidemiological information. If result is POSITIVE SARS-CoV-2 target nucleic acids are DETECTED. The SARS-CoV-2 RNA is generally detectable in upper and lower  respiratory specimens dur ing the acute phase of infection.  Positive  results are indicative of active infection with SARS-CoV-2.  Clinical  correlation with patient history and other diagnostic information is  necessary to determine patient infection status.  Positive results do  not rule out bacterial infection  or co-infection with other viruses. If result is PRESUMPTIVE POSTIVE SARS-CoV-2 nucleic acids MAY BE PRESENT.  A presumptive positive result was obtained on the submitted specimen  and confirmed on repeat testing.  While 2019 novel coronavirus  (SARS-CoV-2) nucleic acids may be present in the submitted sample  additional confirmatory testing may be necessary for epidemiological  and / or clinical management purposes  to differentiate between  SARS-CoV-2 and other Sarbecovirus currently known to infect humans.  If clinically indicated additional testing with an alternate test  methodology [phone]) is advised. The SARS-CoV-2 RNA is generally  detectable in upper and lower respiratory sp ecimens during the acute  phase of infection. The expected result is Negative. Fact Sheet for Patients:  StrictlyIdeas.no Fact Sheet for Healthcare Providers: BankingDealers.co.za This test is not yet approved or cleared by the [place] FDA and has been authorized for detection and/or diagnosis of SARS-CoV-2 by FDA under an Emergency Use Authorization (EUA).  This EUA will remain in effect (meaning this test can be used) for the duration of the COVID-19 declaration under Section 564(b)(1) of the Act, 21 U.S.C. section 360bbb-3(b)(1), unless the authorization is terminated or revoked sooner. Performed at [facility], [address], [place], [place] [address]   Culture, blood (routine x 2)     Status: None   Collection Time: 08/21/19 11:34 AM   Specimen: BLOOD RIGHT ARM  Result Value Ref Range Status   Specimen Description BLOOD RIGHT ARM  Final   Special Requests   Final    BOTTLES DRAWN AEROBIC AND ANAEROBIC Blood Culture results may not be optimal due to an excessive volume of blood received in culture bottles   Culture   Final    NO GROWTH 5 DAYS Performed at [facility], [place] [address], [place], [place] [address]    Report Status  08/26/2019 FINAL  Final  Culture, blood (routine x 2)     Status: None   Collection Time: 08/21/19 11:34 AM   Specimen: BLOOD LEFT HAND  Result Value Ref Range Status   Specimen Description BLOOD LEFT HAND  Final   Special Requests   Final    BOTTLES DRAWN AEROBIC AND ANAEROBIC Blood Culture adequate volume   Culture   Final    NO GROWTH 5 DAYS Performed at [facility], [place] [address] [place],  [address]    Report Status 08/26/2019 FINAL  Final         Radiology Studies: [place] Ekg Site Rite  Result Date: 08/26/2019 If Site Rite image not attached, placement could not be confirmed due to current cardiac rhythm.       Scheduled Meds: . aspirin EC  81 mg Oral Daily  . clopidogrel  75 mg Oral Q breakfast  . enoxaparin (LOVENOX) injection  40 mg Subcutaneous Q24H  . polyethylene glycol  17 g Oral Daily  . rosuvastatin  10 mg Oral q1800  . senna  1 tablet Oral QHS  . sodium chloride flush  3 mL Intravenous Q12H   Continuous Infusions: . sodium chloride 250 mL (08/20/19 1324)  . sodium chloride    .  ceFAZolin (ANCEF) IV 2 g (08/26/19 0521)     LOS: 7 days    Time spent: 35 minutes    Elmarie Shiley, MD [facility] Pager [phone]  If 7PM-7AM, please contact night-coverage [email or web address] Password [email or web address] 08/26/2019, 2:28 PM

## 2019-08-26 NOTE — Progress Notes (Signed)
PT Cancellation Note  Patient Details Name: Raymond Walker. MRN: [id number] DOB: 1951/09/25   Cancelled Treatment:    Reason Eval/Treat Not Completed: Patient declined, no reason specified.  Has no ortho shoe yet and is feeling he could sustain a better effort if R foot was protected.     [name] 08/26/2019, 3:23 PM   Mee Hives, PT MS Acute Rehab Dept. Number: [place] and [place]

## 2019-08-26 NOTE — Progress Notes (Signed)
PT Cancellation Note  Patient Details Name: Raymond Walker. MRN: [id number] DOB: August 27, 1951   Cancelled Treatment:    Reason Eval/Treat Not Completed: Patient declined, no reason specified.  Will reattempt at another time, and recommend an ortho shoe as pt is reluctant to use R foot without footwear to protect it.   [name] 08/26/2019, 10:31 AM   Mee Hives, PT MS Acute Rehab Dept. Number: [place] and [place]

## 2019-08-26 NOTE — Progress Notes (Signed)
Nutrition Follow-up  DOCUMENTATION CODES:   Severe malnutrition in context of chronic illness  INTERVENTION:  -Ensure Enlive po BID, each supplement provides 350 kcal and 20 grams of protein  -Juven BID, each packet provides 95 calories, 2.5 grams of protein (collagen), and 9.8 grams of carbohydrate (3 grams sugar); also contains 7 grams of L-arginine and L-glutamine, 300 mg vitamin C, 15 mg vitamin E, 1.2 mcg vitamin B-12, 9.5 mg zinc, 200 mg calcium, and 1.5 g  Calcium Beta-hydroxy-Beta-methylbutyrate to support wound healing  -MVI with minerals daily   NUTRITION DIAGNOSIS:   Severe Malnutrition related to chronic illness(COPD, pancreatitis) as evidenced by severe fat depletion, severe muscle depletion.  ongoing  GOAL:   Patient will meet greater than or equal to 90% of their needs  progressing  MONITOR:   PO intake, Supplement acceptance, Diet advancement, Labs, Weight trends, Skin, I & O's  REASON FOR ASSESSMENT:   Consult Wound healing  ASSESSMENT:  RD working remotely.  Raymond Walker. is a 68 y.o. male with medical history significant of COPD, erectile dysfunction, glucose intolerance, history of nephrolithiasis, recurrent pancreatitis, history of thrombocytopenia, current smoker who is coming to the emergency department due to 2 to 3-day exacerbation of chronic right foot toes ulcers that have been present for several months.  Right foot is more edematous, erythematous and mildly tender in recent days.  He states that he probably did initial trauma when coating his toenails a few months ago.  He denies fever, chills, sore throat, rhinorrhea, wheezing or hemoptysis.  No chest pain, palpitations, dizziness, diaphoresis, PND, orthopnea or pitting edema of the lower extremities.  Denies abdominal pain, nausea or vomiting, diarrhea, constipation, melena or hematochezia.  No dysuria, frequency or hematuria.  Denies blurred vision or polyphagia, but states that sometimes he  gets some polydipsia and polyuria.  Pt admitted with diabetic foot ulcer with osteomyelitis.   9/3 angiogram; stenting; planned rt femoral bypass and amputation delayed due to MSSA bacteremia  Reviewed ID note - critical ischemia of right lower limb with ischemic wounds to toes 1-3 on the right; surgery tentatively planned the week of 9/28; level of amputation undetermined at this time; suspected transmetatarsal amputation.  Patient eating 60-100% of meals at this time.   Reviewed wt hx; noted pt has experienced a 10% wt loss x 1year.   I/Os: -2651 ml since admission UOP: 1055 ml x 24 hrs  Medications reviewed and include: miralax, senokot, ancef  Labs: CBGS 92-139  Diet Order:   Diet Order            Diet Heart Room service appropriate? Yes; Fluid consistency: Thin  Diet effective now              EDUCATION NEEDS:   Not appropriate for education at this time  Skin:  Skin Assessment: Reviewed RN Assessment  Last BM:  9/2/200  Height:   Ht Readings from Last 1 Encounters:  08/20/19 6' (1.829 m)    Weight:   Wt Readings from Last 1 Encounters:  08/20/19 63.6 kg    Ideal Body Weight:  80.9 kg  BMI:  Body mass index is 19.02 kg/m.  Estimated Nutritional Needs:   Kcal:  2000-2200  Protein:  110-125 grams  Fluid:  > 2 L   Lajuan Lines, RD, LDN [facility] Telephone [phone] After Hours/Weekend Pager: [phone]

## 2019-08-26 NOTE — Progress Notes (Signed)
Patient requests Niece to be updated on plan of care stating he does not understand a lot of what is happening to him.   Niece states patient has never been in the hospital before and requires thorough explainations of treatments.  If patient refuses any treatment, please call niece so she can help explain things.

## 2019-08-26 NOTE — Progress Notes (Signed)
Pt states he absolutely did not refuse physical therapy.  Pt states PT was suppose to be going to get a boot and they will come back and walk.

## 2019-08-26 NOTE — Care Management Important Message (Signed)
Important Message  Patient Details  Name: Raymond Walker. MRN: [id number] Date of Birth: 01-Dec-1951   Medicare Important Message Given:  Yes     Edrees, Rodrick 08/26/2019, 12:11 PM

## 2019-08-26 NOTE — Progress Notes (Signed)
  Progress Note    08/26/2019 2:16 PM 6 Days Post-Op  Subjective:  *No acute issues feeling okay Vitals:   08/26/19 0410 08/26/19 0837  BP: 91/64 (!) 129/59  Pulse: 75 68  Resp: 13   Temp: 98.1 F (36.7 C)   SpO2: 93% 91%    Physical Exam: Awake alert oriented Nonlabored respirations Abdomen soft Bilateral common femoral arteries are palpable Right foot stable gangrenous changes  CBC    Component Value Date/Time   WBC 8.2 08/26/2019 0506   RBC 4.10 (L) 08/26/2019 0506   HGB 11.9 (L) 08/26/2019 0506   HCT 35.9 (L) 08/26/2019 0506   PLT 180 08/26/2019 0506   MCV 87.6 08/26/2019 0506   MCH 29.0 08/26/2019 0506   MCHC 33.1 08/26/2019 0506   RDW 13.2 08/26/2019 0506   LYMPHSABS 1.5 08/26/2019 0506   MONOABS 0.8 08/26/2019 0506   EOSABS 0.1 08/26/2019 0506   BASOSABS 0.0 08/26/2019 0506    BMET    Component Value Date/Time   NA 135 08/24/2019 0055   NA 141 10/11/2015 1041   K 3.1 (L) 08/24/2019 0055   CL 97 (L) 08/24/2019 0055   CO2 29 08/24/2019 0055   GLUCOSE 132 (H) 08/24/2019 0055   BUN 10 08/24/2019 0055   BUN 11 10/11/2015 1041   CREATININE 0.70 08/26/2019 0506   CREATININE 0.70 08/01/2018 1126   CALCIUM 8.1 (L) 08/24/2019 0055   GFRNONAA >60 08/26/2019 0506   GFRNONAA 97 08/01/2018 1126   GFRAA >60 08/26/2019 0506   GFRAA 112 08/01/2018 1126    INR    Component Value Date/Time   INR 1.1 08/19/2019 1827     Intake/Output Summary (Last 24 hours) at 08/26/2019 1416 Last data filed at 08/26/2019 0450 Gross per 24 hour  Intake 120 ml  Output 775 ml  Net -655 ml    Assessment:  68 y.o. male is s/p iliac artery stenting with gangrenous toes on the right.  Positive blood cultures.  Infectious disease following.  Plan: We will plan bypass after the week of September 28.  This will give patient sufficient antibiotics.  He can discharge to SNF and we will set up as outpatient.  Continue Plavix through the procedure.    Brandon C. Donzetta Matters, MD  [facility] Office: [phone] Pager: [phone]  08/26/2019 2:16 PM

## 2019-08-27 LAB — CBC WITH DIFFERENTIAL/PLATELET
Abs Immature Granulocytes: 0.02 10*3/uL (ref 0.00–0.07)
Basophils Absolute: 0.1 10*3/uL (ref 0.0–0.1)
Basophils Relative: 1 %
Eosinophils Absolute: 0.1 10*3/uL (ref 0.0–0.5)
Eosinophils Relative: 2 %
HCT: 34.3 % — ABNORMAL LOW (ref 39.0–52.0)
Hemoglobin: 11.5 g/dL — ABNORMAL LOW (ref 13.0–17.0)
Immature Granulocytes: 0 %
Lymphocytes Relative: 23 %
Lymphs Abs: 1.9 10*3/uL (ref 0.7–4.0)
MCH: 29 pg (ref 26.0–34.0)
MCHC: 33.5 g/dL (ref 30.0–36.0)
MCV: 86.4 fL (ref 80.0–100.0)
Monocytes Absolute: 0.9 10*3/uL (ref 0.1–1.0)
Monocytes Relative: 10 %
Neutro Abs: 5.3 10*3/uL (ref 1.7–7.7)
Neutrophils Relative %: 64 %
Platelets: 205 10*3/uL (ref 150–400)
RBC: 3.97 MIL/uL — ABNORMAL LOW (ref 4.22–5.81)
RDW: 13.2 % (ref 11.5–15.5)
WBC: 8.3 10*3/uL (ref 4.0–10.5)
nRBC: 0 % (ref 0.0–0.2)

## 2019-08-27 LAB — BASIC METABOLIC PANEL
Anion gap: 12 (ref 5–15)
BUN: 9 mg/dL (ref 8–23)
CO2: 26 mmol/L (ref 22–32)
Calcium: 8.7 mg/dL — ABNORMAL LOW (ref 8.9–10.3)
Chloride: 97 mmol/L — ABNORMAL LOW (ref 98–111)
Creatinine, Ser: 0.65 mg/dL (ref 0.61–1.24)
GFR calc Af Amer: >60 mL/min (ref >60)
GFR calc non Af Amer: >60 mL/min (ref >60)
Glucose, Bld: 100 mg/dL — ABNORMAL HIGH (ref 70–99)
Potassium: 3.6 mmol/L (ref 3.5–5.1)
Sodium: 135 mmol/L (ref 135–145)

## 2019-08-27 MED ORDER — SODIUM CHLORIDE 0.9% FLUSH
10.0000 mL | INTRAVENOUS | Status: DC | PRN
  Administered 2019-08-29: 10 mL
  Filled 2019-08-27: qty 40

## 2019-08-27 MED ORDER — CHLORHEXIDINE GLUCONATE CLOTH 2 % EX PADS
6.0000 | MEDICATED_PAD | Freq: Every day | CUTANEOUS | Status: DC
  Administered 2019-08-27 – 2019-08-29 (×3): 6 via TOPICAL

## 2019-08-27 MED ORDER — SODIUM CHLORIDE 0.9% FLUSH
10.0000 mL | Freq: Two times a day (BID) | INTRAVENOUS | Status: DC
  Administered 2019-08-27 – 2019-08-29 (×5): 10 mL

## 2019-08-27 NOTE — Evaluation (Signed)
Physical Therapy Evaluation Patient Details Name: Raymond Walker. MRN: [id number] DOB: 1951-06-07 Today's Date: 08/27/2019   History of Present Illness  68 year old with past medical history significant for tobacco abuse, glucose intolerance, orchiectomy and vasectomy, rectal dysfunction, nephrolithiasis who was not on any medication prior to admission presented to the ED complaining of blackening and ulcer on the toes of his right foot.  Found to have MSSA bacteremia and needing revascularization of R LE.  Clinical Impression  Patient presents with decreased independence and safety with mobility able to walk about 120' with RW and S to minguard due to fatigue, and education on step length with necrotic toes.  Patient lives alone and will need SNF level rehab prior to d/c home with family support especially with planned procedures due to R leg ischemia.  PT to follow acutely.    Follow Up Recommendations SNF;Supervision/Assistance - 24 hour    Equipment Recommendations  Rolling walker with 5" wheels    Recommendations for Other Services       Precautions / Restrictions Precautions Precautions: Fall Required Braces or Orthoses: Other Brace Other Brace: post op shoe on R      Mobility  Bed Mobility Overal bed mobility: Modified Independent                Transfers Overall transfer level: Needs assistance Equipment used: Rolling walker (2 wheeled) Transfers: Sit to/from Stand Sit to Stand: Min guard         General transfer comment: for safety/balance  Ambulation/Gait Ambulation/Gait assistance: Min guard;Supervision Gait Distance (Feet): 120 Feet Assistive device: Rolling walker (2 wheeled) Gait Pattern/deviations: Step-to pattern;Step-through pattern;Trunk flexed     General Gait Details: initially with S, then fatigued quickly with heavy UE support and even leaning on walker, minguard for safety/support due to fatigue/cues for shorter step on L to decrease  weight bearing on toes due to discomfort  Stairs            Wheelchair Mobility    Modified Rankin (Stroke Patients Only)       Balance Overall balance assessment: Needs assistance   Sitting balance-Leahy Scale: Good     Standing balance support: Bilateral upper extremity supported Standing balance-Leahy Scale: Poor Standing balance comment: UE support needed                             Pertinent Vitals/Pain Pain Assessment: Faces Faces Pain Scale: Hurts little more Pain Location: R foot Pain Descriptors / Indicators: Discomfort;Guarding Pain Intervention(s): Monitored during session;Repositioned    Home Living Family/patient expects to be discharged to:: Private residence Living Arrangements: Alone Available Help at Discharge: Family;Available PRN/intermittently Type of Home: House Home Access: Stairs to enter   [employer] of Steps: 10 Home Layout: One level Home Equipment: [name] - single point      Prior Function Level of Independence: Independent               Hand Dominance        Extremity/Trunk Assessment   Upper Extremity Assessment Upper Extremity Assessment: Overall WFL for tasks assessed    Lower Extremity Assessment Lower Extremity Assessment: Generalized weakness    Cervical / Trunk Assessment Cervical / Trunk Assessment: Kyphotic  Communication   Communication: No difficulties  Cognition Arousal/Alertness: Awake/alert Behavior During Therapy: WFL for tasks assessed/performed Overall Cognitive Status: Within Functional Limits for tasks assessed  General Comments: not really cognitive deficits, but low education, needs very simple terms/explanations      General Comments      Exercises     Assessment/Plan    PT Assessment Patient needs continued PT services  PT Problem List Decreased balance;Decreased activity tolerance;Decreased strength;Decreased  safety awareness;Decreased knowledge of use of DME       PT Treatment Interventions DME instruction;Therapeutic activities;Balance training;Therapeutic exercise;Functional mobility training;Gait training;Patient/family education    PT Goals (Current goals can be found in the Care Plan section)  Acute Rehab PT Goals Patient Stated Goal: to get stronger/return to independent PT Goal Formulation: With patient Time For Goal Achievement: 09/10/19 Potential to Achieve Goals: Good    Frequency Min 2X/week   Barriers to discharge        Co-evaluation               AM-PAC PT "6 Clicks" Mobility  Outcome Measure Help needed turning from your back to your side while in a flat bed without using bedrails?: None Help needed moving from lying on your back to sitting on the side of a flat bed without using bedrails?: None Help needed moving to and from a bed to a chair (including a wheelchair)?: A Little Help needed standing up from a chair using your arms (e.g., wheelchair or bedside chair)?: A Little Help needed to walk in hospital room?: A Little Help needed climbing 3-5 steps with a railing? : A Lot 6 Click Score: 19    End of Session Equipment Utilized During Treatment: Gait belt Activity Tolerance: Patient limited by fatigue Patient left: in bed;with call bell/phone within reach Nurse Communication: Mobility status PT Visit Diagnosis: Muscle weakness (generalized) (M62.81);Difficulty in walking, not elsewhere classified (R26.2)    Time: [id number] PT Time Calculation (min) (ACUTE ONLY): 26 min   Charges:   PT Evaluation $PT Eval Moderate Complexity: 1 Mod PT Treatments $Gait Training: 8-22 mins        Raymond Walker, [place] [facility] [phone] 08/27/2019   Raymond Walker 08/27/2019, 5:49 PM

## 2019-08-27 NOTE — Progress Notes (Signed)
Peripherally Inserted Central Catheter/Midline Placement  The IV Nurse has discussed with the patient and/or persons authorized to consent for the patient, the purpose of this procedure and the potential benefits and risks involved with this procedure.  The benefits include less needle sticks, lab draws from the catheter, and the patient may be discharged home with the catheter. Risks include, but not limited to, infection, bleeding, blood clot (thrombus formation), and puncture of an artery; nerve damage and irregular heartbeat and possibility to perform a PICC exchange if needed/ordered by physician.  Alternatives to this procedure were also discussed.  Bard Power PICC patient education guide, fact sheet on infection prevention and patient information card has been provided to patient /or left at bedside.    PICC/Midline Placement Documentation  PICC Single Lumen 08/27/19 PICC Right Brachial 40 cm 0 cm (Active)  Indication for Insertion or Continuance of Line Home intravenous therapies (PICC only) 08/27/19 1110  Exposed Catheter (cm) 0 cm 08/27/19 1110  Site Assessment Clean;Dry;Intact 08/27/19 1110  Line Status Flushed;Saline locked;Blood return noted 08/27/19 1110  Dressing Type Transparent;Securing device 08/27/19 1110  Dressing Status Clean;Dry;Intact;Antimicrobial disc in place 08/27/19 1110  Dressing Intervention New dressing 08/27/19 1110  Dressing Change Due 09/03/19 08/27/19 1110       Enos Fling 08/27/2019, 11:30 AM

## 2019-08-27 NOTE — TOC Initial Note (Signed)
Transition of Care (TOC) - Initial/Assessment Note    Patient Details  Name: Raymond Walker. MRN: [id number] Date of Birth: June 13, 1951  Transition of Care [facility]) CM/SW Contact:    Vinie Sill, [place] Phone Number: 08/27/2019, 12:54 PM  Clinical Narrative:                  CSW received consult for discharge needs. CSW spoke with patient's niece, Maryann Conners regarding PT recommendation of SNF placement at time of discharge. She informs CSW that the patient lived with his sister but she is currently receiving rehab herself and is unable to care for the patient. Patient's niece states after SNF rehab the patient will discharge home with her. Patient and family recognizes need for rehab before returning home and is agreeable to a SNF placement. Family reported preference for SNF in the [place] area. Family expressed understanding of CSW role and discharge process as well as medical condition. No questions/concerns about plan or treatment at this time.   Thurmond Butts, MSW, [facility] Clinical Social Worker [phone]    Expected Discharge Plan: Skilled Nursing Facility Barriers to Discharge: SNF Pending bed offer, Insurance Authorization   Patient Goals and CMS Choice        Expected Discharge Plan and Services Expected Discharge Plan: [place] In-house Referral: Clinical Social Work     Living arrangements for the past 2 months: Single Family Home                                      Prior Living Arrangements/Services Living arrangements for the past 2 months: Single Family Home Lives with:: Self, Parents                   Activities of Daily Living Home Assistive Devices/Equipment: Cane (specify quad or straight) ADL Screening (condition at time of admission) Patient's cognitive ability adequate to safely complete daily activities?: Yes Is the patient deaf or have difficulty hearing?: No Does the patient have difficulty seeing, even when  wearing glasses/contacts?: No Does the patient have difficulty concentrating, remembering, or making decisions?: No Patient able to express need for assistance with ADLs?: Yes Does the patient have difficulty dressing or bathing?: No Independently performs ADLs?: Yes (appropriate for developmental age) Does the patient have difficulty walking or climbing stairs?: No Weakness of Legs: Both Weakness of Arms/Hands: None  Permission Sought/Granted Permission sought to share information with : Family Supports, Chartered certified accountant granted to share information with : Yes, Verbal Permission Granted              Emotional Assessment              Admission diagnosis:  PAD (peripheral artery disease) ([place]) [I73.9] Cellulitis of right lower extremity [L03.115] Acute osteomyelitis of toe of right foot ([place]) [M86.171] Patient Active Problem List   Diagnosis Date Noted  . Protein-calorie malnutrition, severe 08/21/2019  . MSSA bacteremia 08/21/2019  . Acute osteomyelitis of toe of right foot ([place])   . Cellulitis of right lower extremity   . 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[place]) 08/19/2019  . Impaired glucose tolerance   . COPD (chronic obstructive pulmonary disease) ([place])   . Thrombocytopenia ([place]) 08/10/2018  . Aortic atherosclerosis ([place]) 10/14/2016  . Coronary artery calcification seen on CAT scan 10/14/2016  . Other emphysema ([place]) 10/14/2016  . Elevated blood pressure 12/03/2011  . Chest pain  12/03/2011  . Tobacco abuse 12/03/2011  . Right leg pain 12/03/2011   PCP:  [name], MD Pharmacy:   [facility], [address] [place] [address] Phone: [phone] Fax: [phone]     Social Determinants of Health (SDOH) Interventions    Readmission Risk Interventions No flowsheet data found.

## 2019-08-27 NOTE — Progress Notes (Signed)
PROGRESS NOTE    Raymond Walker.  IWP:[id number] DOB: 1951/06/09 DOA: 08/19/2019 PCP: [name], MD   Brief Narrative: 68 year old with past medical history significant for tobacco abuse, glucose intolerance, orchiectomy and vasectomy, rectal dysfunction, nephrolithiasis who was not on any medication prior to admission presented to the ED complaining of blackening and ulcer on the toes of his right foot which has been progressing for about 2 months.  He has not seen his primary care doctor for this problem.  Evaluation in the ED patient was noted to be febrile with temperature 102 lactic acid 2.3 white count 12.  Pedal pulses were not able to be found with Dopplers.  Underwent angiogram on 9/3 and stenting.  Plan was for right femoral bypass and then amputation of the toes however he was found to have MSSA bacteremia.  ID was consulted and is recommending 2 weeks of IV antibiotics prior to bypass surgery.    Assessment & Plan:   Principal Problem:   MSSA bacteremia Active Problems:   Tobacco abuse   Coronary artery calcification seen on CAT scan   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HCC)   Impaired glucose tolerance   COPD (chronic obstructive pulmonary disease) (HCC)   Protein-calorie malnutrition, severe   Acute osteomyelitis of toe of right foot (HCC)   Cellulitis of right lower extremity  1-Foot gangrene and cellulitis with severe sepsis, fever and leukocytosis Pain 19, ESR 24. -Patient was a started on aspirin and Crestor.  He has received morphine and oxycodone for pain control. -Vascular surgery was consulted and patient underwent on 9/3 right common iliac stent, right external iliac angioplasty with stent placement. -Patient will require bypass surgery of the right femoral, along with amputation of 3 toes.  He will require 2 weeks of IV antibiotics prior to bypass surgery, because he was found to have MSSA bacteremia. -Further  IV antibiotic therapy will need to be  determined after he has his bypass -Continue with Plavix -needs SNF.   2-MSSA bacteremia: [facility]. Cultures repeated subsequently has been negative. He will require IV antibiotics at discharge. PICC line placed.   Hematuria: Likely related to in and out cath. History of nephrolithiasis per CT imaging, he denies flank pain. Need follow-up UA as an outpatient.  Hypertension: Continue with metoprolol.  Coronary artery calcification on CT scan: Denies chest pain. Echo: Normal ejection fraction no wall motion abnormality, grade 2 diastolic dysfunction.  COPD: He will require formal pulmonary function test, as an outpatient. Prior CT in 2017 showed mild to moderate centrilobular emphysema with diffuse bronchial wall thickening suggestive of COPD  Tobacco abuse: Nicotine patch ordered.  Counseling provided  Severe malnutrition in context of chronic illness: Continue with nutrition supplements.  Nutrition Problem: Severe Malnutrition Etiology: chronic illness(COPD, pancreatitis)    Signs/Symptoms: severe fat depletion, severe muscle depletion    Interventions: Refer to RD note for recommendations  Estimated body mass index is 19.02 kg/m as calculated from the following:   Height as of this encounter: 6' (1.829 m).   Weight as of this encounter: 63.6 kg.   DVT prophylaxis: Lovenox Code Status: Full code Family Communication: Niece at bedside Disposition Plan: awaiting SNF placement.  Consultants:   Vascular  Procedures:  Angiogram and stenting.  ECHO; 1. The left ventricle has normal systolic function with an ejection fraction of 60-65%. The cavity size was normal. Left ventricular diastolic Doppler parameters are consistent with pseudonormalization. Elevated mean left  atrial pressure.  2. The right ventricle has normal systolic function. The cavity was normal.  3. Left atrial size was mildly dilated.  4. The mitral  valve is grossly normal.  5. The tricuspid valve is grossly normal.  6. The aortic valve is tricuspid. Mild thickening of the aortic valve. No stenosis of the aortic valve.  7. The aorta is normal unless otherwise noted.  8. Normal LV systolic function; grade 2 diastolic dysfunction; mild proximal septal thickening; mild LAE.   Antimicrobials:  Ancef  Subjective: No new complaints. He will work with PT  Objective: Vitals:   08/26/19 2017 08/27/19 0428 08/27/19 0955 08/27/19 1201  BP: 94/70 118/70 (!) 100/56 139/90  Pulse: 64 69  70  Resp: '12 18 12 18  ' Temp: 99.4 F (37.4 C) 99.9 F (37.7 C)  (!) 97.5 F (36.4 C)  TempSrc: Oral Oral  Oral  SpO2: 97% (!) 89%  95%  Weight:      Height:        Intake/Output Summary (Last 24 hours) at 08/27/2019 1420 Last data filed at 08/27/2019 0800 Gross per 24 hour  Intake 600 ml  Output 675 ml  Net -75 ml   Filed Weights   08/19/19 2130 08/20/19 0052 08/20/19 0621  Weight: 63.5 kg 63.8 kg 63.6 kg    Examination:  General exam: NAD Respiratory system: CTA Cardiovascular system: S 1, S 2, RRR Gastrointestinal system: BS present, soft, nt Central nervous system; non focal.  Extremities: Symmetric 5 x 5 power.  Right lower extremity with dry black gangrene multiple toes of the right foot     Data Reviewed: I have personally reviewed following labs and imaging studies  CBC: Recent Labs  Lab 08/23/19 0356 08/24/19 0055 08/25/19 0419 08/26/19 0506 08/27/19 0311  WBC 7.2 8.1 9.7 8.2 8.3  NEUTROABS 5.0 5.7 6.7 5.7 5.3  HGB 11.3* 11.0* 12.0* 11.9* 11.5*  HCT 33.9* 33.0* 36.4* 35.9* 34.3*  MCV 88.1 87.1 88.1 87.6 86.4  PLT 154 155 188 180 630   Basic Metabolic Panel: Recent Labs  Lab 08/21/19 0253 08/22/19 0356 08/23/19 0356 08/24/19 0055 08/26/19 0506 08/27/19 0311  NA 139 140 137 135  --  135  K 3.3* 3.7 3.5 3.1*  --  3.6  CL 106 104 100 97*  --  97*  CO2 '24 27 27 29  ' --  26  GLUCOSE 98 106* 92 132*  --  100*   BUN 7* 6* 5* 10  --  9  CREATININE 0.68 0.72 0.66 0.67 0.70 0.65  CALCIUM 8.3* 8.7* 8.4* 8.1*  --  8.7*   GFR: Estimated Creatinine Clearance: 78.4 mL/min (by C-G formula based on SCr of 0.65 mg/dL). Liver Function Tests: No results for input(s): AST, ALT, ALKPHOS, BILITOT, PROT, ALBUMIN in the last 168 hours. No results for input(s): LIPASE, AMYLASE in the last 168 hours. No results for input(s): AMMONIA in the last 168 hours. Coagulation Profile: No results for input(s): INR, PROTIME in the last 168 hours. Cardiac Enzymes: No results for input(s): CKTOTAL, CKMB, CKMBINDEX, TROPONINI in the last 168 hours. BNP (last 3 results) No results for input(s): PROBNP in the last 8760 hours. HbA1C: No results for input(s): HGBA1C in the last 72 hours. CBG: Recent Labs  Lab 08/22/19 1605 08/22/19 2115 08/23/19 0635 08/23/19 1128 08/23/19 1626  GLUCAP 121* 139* 92 139* 122*   Lipid Profile: No results for input(s): CHOL, HDL, LDLCALC, TRIG, CHOLHDL, LDLDIRECT in the last 72 hours. Thyroid Function  Tests: No results for input(s): TSH, T4TOTAL, FREET4, T3FREE, THYROIDAB in the last 72 hours. Anemia Panel: No results for input(s): VITAMINB12, FOLATE, FERRITIN, TIBC, IRON, RETICCTPCT in the last 72 hours. Sepsis Labs: No results for input(s): PROCALCITON, LATICACIDVEN in the last 168 hours.  Recent Results (from the past 240 hour(s))  Blood Culture (routine x 2)     Status: Abnormal   Collection Time: 08/19/19  6:27 PM   Specimen: BLOOD  Result Value Ref Range Status   Specimen Description   Final    BLOOD RIGHT ANTECUBITAL Performed at [facility], [address], [place], [place] [address]    Special Requests   Final    BOTTLES DRAWN AEROBIC AND ANAEROBIC Blood Culture adequate volume Performed at [place] [place], [place] [address]    Culture  Setup Time   Final    GRAM POSITIVE COCCI AEROBIC BOTTLE ONLY Gram Stain Report Called to,Read Back By and  Verified With: MADISON T. AT [facility] AT [id number] ON [id number] BY THOMPSON S. Organism ID to follow CRITICAL RESULT CALLED TO, READ BACK BY AND VERIFIED WITH: Tillman Sers [facility] 08/20/19 2005 JDW Performed at [facility], [place] [address] [place], [place] [address]    Culture STAPHYLOCOCCUS AUREUS (A)  Final   Report Status 08/22/2019 FINAL  Final   Organism ID, Bacteria STAPHYLOCOCCUS AUREUS  Final      Susceptibility   Staphylococcus aureus - MIC*    CIPROFLOXACIN <=0.5 SENSITIVE Sensitive     ERYTHROMYCIN <=0.25 SENSITIVE Sensitive     GENTAMICIN <=0.5 SENSITIVE Sensitive     OXACILLIN 0.5 SENSITIVE Sensitive     TETRACYCLINE <=1 SENSITIVE Sensitive     VANCOMYCIN 1 SENSITIVE Sensitive     TRIMETH/SULFA <=10 SENSITIVE Sensitive     CLINDAMYCIN <=0.25 SENSITIVE Sensitive     RIFAMPIN <=0.5 SENSITIVE Sensitive     Inducible Clindamycin NEGATIVE Sensitive     * STAPHYLOCOCCUS AUREUS  Blood Culture ID Panel (Reflexed)     Status: Abnormal   Collection Time: 08/19/19  6:27 PM  Result Value Ref Range Status   Enterococcus species NOT DETECTED NOT DETECTED Final   Listeria monocytogenes NOT DETECTED NOT DETECTED Final   Staphylococcus species DETECTED (A) NOT DETECTED Final    Comment: CRITICAL RESULT CALLED TO, READ BACK BY AND VERIFIED WITH: Tillman Sers [facility] 08/20/19 2005 JDW    Staphylococcus aureus (BCID) DETECTED (A) NOT DETECTED Final    Comment: Methicillin (oxacillin) susceptible Staphylococcus aureus (MSSA). Preferred therapy is anti staphylococcal beta lactam antibiotic (Cefazolin or Nafcillin), unless clinically contraindicated. CRITICAL RESULT CALLED TO, READ BACK BY AND VERIFIED WITH: Tillman Sers [facility] 08/20/19 2005 JDW    Methicillin resistance NOT DETECTED NOT DETECTED Final   Streptococcus species NOT DETECTED NOT DETECTED Final   Streptococcus agalactiae NOT DETECTED NOT DETECTED Final   Streptococcus pneumoniae NOT DETECTED NOT DETECTED Final   Streptococcus pyogenes NOT DETECTED  NOT DETECTED Final   Acinetobacter baumannii NOT DETECTED NOT DETECTED Final   Enterobacteriaceae species NOT DETECTED NOT DETECTED Final   Enterobacter cloacae complex NOT DETECTED NOT DETECTED Final   Escherichia coli NOT DETECTED NOT DETECTED Final   Klebsiella oxytoca NOT DETECTED NOT DETECTED Final   Klebsiella pneumoniae NOT DETECTED NOT DETECTED Final   Proteus species NOT DETECTED NOT DETECTED Final   Serratia marcescens NOT DETECTED NOT DETECTED Final   Haemophilus influenzae NOT DETECTED NOT DETECTED Final   Neisseria meningitidis NOT DETECTED NOT DETECTED Final  Pseudomonas aeruginosa NOT DETECTED NOT DETECTED Final   Candida albicans NOT DETECTED NOT DETECTED Final   Candida glabrata NOT DETECTED NOT DETECTED Final   Candida krusei NOT DETECTED NOT DETECTED Final   Candida parapsilosis NOT DETECTED NOT DETECTED Final   Candida tropicalis NOT DETECTED NOT DETECTED Final    Comment: Performed at [facility], [place] [address] [place], [place] [address]  Blood Culture (routine x 2)     Status: None   Collection Time: 08/19/19  7:13 PM   Specimen: BLOOD  Result Value Ref Range Status   Specimen Description BLOOD LEFT ANTECUBITAL  Final   Special Requests   Final    BOTTLES DRAWN AEROBIC AND ANAEROBIC Blood Culture adequate volume   Culture   Final    NO GROWTH 5 DAYS Performed at [facility], [address], [place], [place] [address]    Report Status 08/24/2019 FINAL  Final  Urine culture     Status: None   Collection Time: 08/19/19  8:00 PM   Specimen: In/Out Cath Urine  Result Value Ref Range Status   Specimen Description   Final    IN/OUT CATH URINE Performed at [facility], [address] [place], [place] [address]    Special Requests   Final    NONE Performed at [facility], [address] [place], [place] [address]    Culture   Final    Multiple bacterial morphotypes present, none predominant. Suggest appropriate recollection if clinically indicated.    Report Status 08/21/2019 FINAL  Final  SARS Coronavirus 2 [facility] order, Performed in [facility] hospital lab) Nasopharyngeal Nasopharyngeal Swab     Status: None   Collection Time: 08/19/19  8:11 PM   Specimen: Nasopharyngeal Swab  Result Value Ref Range Status   SARS Coronavirus 2 NEGATIVE NEGATIVE Final    Comment: (NOTE) If result is NEGATIVE SARS-CoV-2 target nucleic acids are NOT DETECTED. The SARS-CoV-2 RNA is generally detectable in upper and lower  respiratory specimens during the acute phase of infection. The lowest  concentration of SARS-CoV-2 viral copies this assay can detect is 250  copies / mL. A negative result does not preclude SARS-CoV-2 infection  and should not be used as the sole basis for treatment or other  patient management decisions.  A negative result may occur with  improper specimen collection / handling, submission of specimen other  than nasopharyngeal swab, presence of viral mutation(s) within the  areas targeted by this assay, and inadequate number of viral copies  (<250 copies / mL). A negative result must be combined with clinical  observations, patient history, and epidemiological information. If result is POSITIVE SARS-CoV-2 target nucleic acids are DETECTED. The SARS-CoV-2 RNA is generally detectable in upper and lower  respiratory specimens dur ing the acute phase of infection.  Positive  results are indicative of active infection with SARS-CoV-2.  Clinical  correlation with patient history and other diagnostic information is  necessary to determine patient infection status.  Positive results do  not rule out bacterial infection or co-infection with other viruses. If result is PRESUMPTIVE POSTIVE SARS-CoV-2 nucleic acids MAY BE PRESENT.   A presumptive positive result was obtained on the submitted specimen  and confirmed on repeat testing.  While 2019 novel coronavirus  (SARS-CoV-2) nucleic acids may be present in the submitted sample   additional confirmatory testing may be necessary for epidemiological  and / or clinical management purposes  to differentiate between  SARS-CoV-2 and other Sarbecovirus currently  known to infect humans.  If clinically indicated additional testing with an alternate test  methodology [phone]) is advised. The SARS-CoV-2 RNA is generally  detectable in upper and lower respiratory sp ecimens during the acute  phase of infection. The expected result is Negative. Fact Sheet for Patients:  StrictlyIdeas.no Fact Sheet for Healthcare Providers: BankingDealers.co.za This test is not yet approved or cleared by the [place] FDA and has been authorized for detection and/or diagnosis of SARS-CoV-2 by FDA under an Emergency Use Authorization (EUA).  This EUA will remain in effect (meaning this test can be used) for the duration of the COVID-19 declaration under Section 564(b)(1) of the Act, 21 U.S.C. section 360bbb-3(b)(1), unless the authorization is terminated or revoked sooner. Performed at [facility], [address], [place], [place] [address]   Culture, blood (routine x 2)     Status: None   Collection Time: 08/21/19 11:34 AM   Specimen: BLOOD RIGHT ARM  Result Value Ref Range Status   Specimen Description BLOOD RIGHT ARM  Final   Special Requests   Final    BOTTLES DRAWN AEROBIC AND ANAEROBIC Blood Culture results may not be optimal due to an excessive volume of blood received in culture bottles   Culture   Final    NO GROWTH 5 DAYS Performed at [facility], [place] [address] [place], [place] [address]    Report Status 08/26/2019 FINAL  Final  Culture, blood (routine x 2)     Status: None   Collection Time: 08/21/19 11:34 AM   Specimen: BLOOD LEFT HAND  Result Value Ref Range Status   Specimen Description BLOOD LEFT HAND  Final   Special Requests   Final    BOTTLES DRAWN AEROBIC AND ANAEROBIC Blood Culture adequate volume    Culture   Final    NO GROWTH 5 DAYS Performed at [facility], [place] [address], [place], [place] [address]    Report Status 08/26/2019 FINAL  Final         Radiology Studies: [place] Ekg Site Rite  Result Date: 08/26/2019 If Site Rite image not attached, placement could not be confirmed due to current cardiac rhythm.       Scheduled Meds: . aspirin EC  81 mg Oral Daily  . Chlorhexidine Gluconate Cloth  6 each Topical Daily  . clopidogrel  75 mg Oral Q breakfast  . enoxaparin (LOVENOX) injection  40 mg Subcutaneous Q24H  . feeding supplement (ENSURE ENLIVE)  237 mL Oral BID BM  . multivitamin with minerals  1 tablet Oral Daily  . nutrition supplement (JUVEN)  1 packet Oral BID BM  . polyethylene glycol  17 g Oral Daily  . rosuvastatin  10 mg Oral q1800  . senna  1 tablet Oral QHS  . sodium chloride flush  10-40 mL Intracatheter Q12H  . sodium chloride flush  3 mL Intravenous Q12H   Continuous Infusions: . sodium chloride 250 mL (08/20/19 5102)  . sodium chloride    .  ceFAZolin (ANCEF) IV 2 g (08/27/19 0605)     LOS: 8 days    Time spent: 35 minutes    Elmarie Shiley, MD [facility] Pager [phone]  If 7PM-7AM, please contact night-coverage [email or web address] Password [email or web address] 08/27/2019, 2:20 PM

## 2019-08-27 NOTE — Progress Notes (Signed)
   Okay for discharge from vascular standpoint.  Palpable bilateral common femoral pulse.  He is scheduled for right femoropopliteal bypass with vein versus graft and amputation of first 3 toes on 9/29.  Shasta Chinn C. Donzetta Matters, MD [facility] Office: [phone] Pager: [phone]

## 2019-08-27 NOTE — Progress Notes (Signed)
Orthopedic Tech Progress Note Patient Details:  Raymond Walker 06/22/1951 [id number]  Ortho Devices Type of Ortho Device: Postop shoe/boot Ortho Device/Splint Location: right Ortho Device/Splint Interventions: Application   Post Interventions Patient Tolerated: Well Instructions Provided: Care of device   [place] Pink 08/27/2019, 10:14 AM

## 2019-08-28 LAB — CBC WITH DIFFERENTIAL/PLATELET
Abs Immature Granulocytes: 0.05 10*3/uL (ref 0.00–0.07)
Basophils Absolute: 0 10*3/uL (ref 0.0–0.1)
Basophils Relative: 1 %
Eosinophils Absolute: 0.1 10*3/uL (ref 0.0–0.5)
Eosinophils Relative: 2 %
HCT: 33.8 % — ABNORMAL LOW (ref 39.0–52.0)
Hemoglobin: 10.9 g/dL — ABNORMAL LOW (ref 13.0–17.0)
Immature Granulocytes: 1 %
Lymphocytes Relative: 22 %
Lymphs Abs: 1.9 10*3/uL (ref 0.7–4.0)
MCH: 28.5 pg (ref 26.0–34.0)
MCHC: 32.2 g/dL (ref 30.0–36.0)
MCV: 88.5 fL (ref 80.0–100.0)
Monocytes Absolute: 0.9 10*3/uL (ref 0.1–1.0)
Monocytes Relative: 10 %
Neutro Abs: 5.7 10*3/uL (ref 1.7–7.7)
Neutrophils Relative %: 64 %
Platelets: 219 10*3/uL (ref 150–400)
RBC: 3.82 MIL/uL — ABNORMAL LOW (ref 4.22–5.81)
RDW: 13.2 % (ref 11.5–15.5)
WBC: 8.6 10*3/uL (ref 4.0–10.5)
nRBC: 0 % (ref 0.0–0.2)

## 2019-08-28 LAB — SARS CORONAVIRUS 2 (TAT 6-24 HRS): SARS Coronavirus 2: NEGATIVE

## 2019-08-28 MED ORDER — NICOTINE 21 MG/24HR TD PT24: 21.0000 mg | MEDICATED_PATCH | Freq: Every day | TRANSDERMAL | 0 refills | Status: DC | PRN

## 2019-08-28 MED ORDER — SENNA 8.6 MG PO TABS: 1.0000 | ORAL_TABLET | Freq: Every day | ORAL | 0 refills | Status: DC

## 2019-08-28 MED ORDER — ROSUVASTATIN CALCIUM 10 MG PO TABS: 10.0000 mg | ORAL_TABLET | Freq: Every day | ORAL | 0 refills | Status: DC

## 2019-08-28 MED ORDER — ADULT MULTIVITAMIN W/MINERALS CH: 1.0000 | ORAL_TABLET | Freq: Every day | ORAL | 0 refills | Status: DC

## 2019-08-28 MED ORDER — ENSURE ENLIVE PO LIQD: 237.0000 mL | Freq: Two times a day (BID) | ORAL | 12 refills | Status: DC

## 2019-08-28 MED ORDER — ASPIRIN 81 MG PO TBEC: 81.0000 mg | DELAYED_RELEASE_TABLET | Freq: Every day | ORAL | 0 refills | Status: DC

## 2019-08-28 MED ORDER — POLYETHYLENE GLYCOL 3350 17 G PO PACK: 17.0000 g | PACK | Freq: Every day | ORAL | 0 refills | Status: DC

## 2019-08-28 MED ORDER — OXYCODONE HCL 5 MG PO TABS: 5.0000 mg | ORAL_TABLET | ORAL | 0 refills | Status: DC | PRN

## 2019-08-28 MED ORDER — JUVEN PO PACK: 1.0000 | PACK | Freq: Two times a day (BID) | ORAL | 0 refills | Status: DC

## 2019-08-28 MED ORDER — CEFAZOLIN IV (FOR PTA / DISCHARGE USE ONLY): 2.0000 g | Freq: Three times a day (TID) | INTRAVENOUS | 0 refills | Status: DC

## 2019-08-28 MED ORDER — CLOPIDOGREL BISULFATE 75 MG PO TABS: 75.0000 mg | ORAL_TABLET | Freq: Every day | ORAL | 0 refills | Status: DC

## 2019-08-28 NOTE — NC FL2 (Signed)
[employer] LEVEL OF CARE SCREENING TOOL     IDENTIFICATION  Patient Name: Raymond Walker. Birthdate: 07-Aug-1951 Sex: male Admission Date (Current Location): 08/19/2019  [facility] and [place] Number:  Herbalist and Address:  [facility] [place], [place] [address], [place], [place] [address]      Provider Number: [id number]  Attending Physician Name and Address:  Elmarie Shiley, MD  Relative Name and Phone Number:  Lytle Michaels [phone]    Current Level of Care: Hospital Recommended Level of Care: [place] Prior Approval Number:    Date Approved/Denied:   PASRR Number: [id number]  Discharge Plan: SNF    Current Diagnoses: Patient Active Problem List   Diagnosis Date Noted  . Protein-calorie malnutrition, severe 08/21/2019  . MSSA bacteremia 08/21/2019  . Acute osteomyelitis of toe of right foot ([place])   . Cellulitis of right lower extremity   . 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[place]) 08/19/2019  . Impaired glucose tolerance   . COPD (chronic obstructive pulmonary disease) ([place])   . Thrombocytopenia ([place]) 08/10/2018  . Aortic atherosclerosis ([place]) 10/14/2016  . Coronary artery calcification seen on CAT scan 10/14/2016  . Other emphysema ([place]) 10/14/2016  . Elevated blood pressure 12/03/2011  . Chest pain 12/03/2011  . Tobacco abuse 12/03/2011  . Right leg pain 12/03/2011    Orientation RESPIRATION BLADDER Height & Weight     Self, Time, Situation, Place  Normal Continent, External catheter Weight: 140 lb 3.4 oz (63.6 kg) Height:  6' (182.9 cm)  BEHAVIORAL SYMPTOMS/MOOD NEUROLOGICAL BOWEL NUTRITION STATUS      Continent Diet(please see discharge summary)  AMBULATORY STATUS COMMUNICATION OF NEEDS Skin   Extensive Assist Verbally Surgical wounds(wound incision(open/dehisced) toe anterior, rt)                       Personal Care Assistance Level of Assistance  Bathing, Feeding, Dressing  Bathing Assistance: Limited assistance Feeding assistance: Independent Dressing Assistance: Limited assistance     Functional Limitations Info  Sight, Hearing, Speech Sight Info: Impaired Hearing Info: Adequate Speech Info: Adequate    SPECIAL CARE FACTORS FREQUENCY  PT (By licensed PT), OT (By licensed OT)     PT Frequency: 5x per week OT Frequency: 5x per week            Contractures Contractures Info: Not present    Additional Factors Info  Code Status, Allergies(PICC Line and receives IV Abx) Code Status Info: FULL Allergies Info: NKA           Current Medications (08/28/2019):  This is the current hospital active medication list Current Facility-Administered Medications  Medication Dose Route Frequency Provider Last Rate Last Dose  . 0.9 %  sodium chloride infusion   Intravenous PRN Marty Heck, MD 10 mL/hr at 08/20/19 0608 250 mL at 08/20/19 0608  . 0.9 %  sodium chloride infusion  250 mL Intravenous PRN Marty Heck, MD      . acetaminophen (TYLENOL) tablet 650 mg  650 mg Oral Q6H PRN Marty Heck, MD   650 mg at 08/27/19 0449   Or  . acetaminophen (TYLENOL) suppository 650 mg  650 mg Rectal Q6H PRN Marty Heck, MD      . acetaminophen (TYLENOL) tablet 650 mg  650 mg Oral Q4H PRN Marty Heck, MD      . aspirin EC tablet 81 mg  81 mg Oral Daily Monica Martinez  J, MD   81 mg at 08/28/19 0909  . bisacodyl (DULCOLAX) suppository 10 mg  10 mg Rectal Daily PRN Debbe Odea, MD      . ceFAZolin (ANCEF) IVPB 2g/100 mL premix  2 g Intravenous Q8H Dixon, Stephanie N, NP 200 mL/hr at 08/28/19 1428 2 g at 08/28/19 1428  . Chlorhexidine Gluconate Cloth 2 % PADS 6 each  6 each Topical Daily Regalado, Belkys A, MD   6 each at 08/28/19 1044  . clopidogrel (PLAVIX) tablet 75 mg  75 mg Oral Q breakfast Marty Heck, MD   75 mg at 08/28/19 0909  . enoxaparin (LOVENOX) injection 40 mg  40 mg Subcutaneous Q24H Marty Heck, MD   40 mg at 08/27/19 2125  . feeding supplement (ENSURE ENLIVE) (ENSURE ENLIVE) liquid 237 mL  237 mL Oral BID BM Regalado, Belkys A, MD   237 mL at 08/28/19 1421  . hydrALAZINE (APRESOLINE) injection 5 mg  5 mg Intravenous Q20 Min PRN Marty Heck, MD      . labetalol (NORMODYNE) injection 10 mg  10 mg Intravenous Q10 min PRN Marty Heck, MD   10 mg at 08/20/19 1641  . morphine 2 MG/ML injection 2-4 mg  2-4 mg Intravenous Q4H PRN Marty Heck, MD   2 mg at 08/27/19 0600  . multivitamin with minerals tablet 1 tablet  1 tablet Oral Daily Regalado, Belkys A, MD   1 tablet at 08/28/19 0908  . nicotine (NICODERM CQ - dosed in mg/24 hours) patch 21 mg  21 mg Transdermal Daily PRN Marty Heck, MD      . nutrition supplement (JUVEN) (JUVEN) powder packet 1 packet  1 packet Oral BID BM Regalado, Belkys A, MD   1 packet at 08/28/19 1421  . ondansetron (ZOFRAN) injection 4 mg  4 mg Intravenous Q6H PRN Marty Heck, MD   4 mg at 08/22/19 1558  . oxyCODONE (Oxy IR/ROXICODONE) immediate release tablet 5 mg  5 mg Oral Q4H PRN Marty Heck, MD   5 mg at 08/28/19 1757  . polyethylene glycol (MIRALAX / GLYCOLAX) packet 17 g  17 g Oral Daily Debbe Odea, MD   17 g at 08/28/19 0908  . prochlorperazine (COMPAZINE) injection 5 mg  5 mg Intravenous Q4H PRN Marty Heck, MD      . rosuvastatin (CRESTOR) tablet 10 mg  10 mg Oral q1800 Marty Heck, MD   10 mg at 08/28/19 1757  . senna (SENOKOT) tablet 8.6 mg  1 tablet Oral QHS Debbe Odea, MD   8.6 mg at 08/27/19 2125  . sodium chloride flush (NS) 0.9 % injection 10-40 mL  10-40 mL Intracatheter Q12H Regalado, Belkys A, MD   10 mL at 08/28/19 1045  . sodium chloride flush (NS) 0.9 % injection 10-40 mL  10-40 mL Intracatheter PRN Regalado, Belkys A, MD      . sodium chloride flush (NS) 0.9 % injection 3 mL  3 mL Intravenous Q12H Marty Heck, MD   3 mL at 08/28/19 1045  . sodium chloride  flush (NS) 0.9 % injection 3 mL  3 mL Intravenous PRN Marty Heck, MD   3 mL at 08/23/19 0912     Discharge Medications: Please see discharge summary for a list of discharge medications.  Relevant Imaging Results:  Relevant Lab Results:   Additional Information SSN [place], [place]

## 2019-08-28 NOTE — TOC Progression Note (Signed)
Transition of Care (TOC) - Progression Note    Patient Details  Name: Raymond Walker. MRN: [id number] Date of Birth: 04-02-1951  Transition of Care [facility]) CM/SW [place], [place] Phone Number: 08/28/2019, 4:17 PM  Clinical Narrative:     [facility] states they have insurance authorization. Covid remains pending. CSW will continue to follow and assist with discharge planning.  Thurmond Butts, MSW, [facility] Clinical Social Worker [phone]   Expected Discharge Plan: Skilled Nursing Facility Barriers to Discharge: SNF Pending bed offer, Insurance Authorization  Expected Discharge Plan and Services Expected Discharge Plan: [facility] In-house Referral: Clinical Social Work     Living arrangements for the past 2 months: Single Family Home Expected Discharge Date: 08/28/19                                     Social Determinants of Health (SDOH) Interventions    Readmission Risk Interventions No flowsheet data found.

## 2019-08-28 NOTE — Discharge Summary (Signed)
Physician Discharge Summary  Raymond Walker. NGE:[id number] DOB: 1951/09/30 DOA: 08/19/2019  PCP: [name], MD  Admit date: 08/19/2019 Discharge date: 08/28/2019  Admitted From: Home  Disposition:  SNF  Recommendations for Outpatient Follow-up:  1. Follow up with PCP in 1-2 weeks 2. Please obtain BMP/CBC in one week 3. Bypass surgery and amputation planned for 9-29 by Dr Donzetta Matters.  4. Needs to be on IV antibiotics at least until after surgery and will need reevaluation by ID after surgery in regards Length of treatment of antibiotics.     Discharge Condition: Stable CODE STATUS: Full code Diet recommendation: Heart Healthy   Brief/Interim Summary: 68 year old with past medical history significant for tobacco abuse, glucose intolerance, orchiectomy and vasectomy, rectal dysfunction, nephrolithiasis who was not on any medication prior to admission presented to the ED complaining of blackening and ulcer on the toes of his right foot which has been progressing for about 2 months.  He has not seen his primary care doctor for this problem.  Evaluation in the ED patient was noted to be febrile with temperature 102 lactic acid 2.3 white count 12.  Pedal pulses were not able to be found with Dopplers.  Underwent angiogram on 9/3 and stenting.  Plan was for right femoral bypass and then amputation of the toes however he was found to have MSSA bacteremia.  ID was consulted and is recommending 2 weeks of IV antibiotics prior to bypass surgery.  1-Foot gangrene and cellulitis with severe sepsis, fever and leukocytosis Pain 19, ESR 24. -Patient was a started on aspirin and Crestor.  He has received morphine and oxycodone for pain control. -Vascular surgery was consulted and patient underwent on 9/3 right common iliac stent, right external iliac angioplasty with stent placement. -Patient will require bypass surgery of the right femoral, along with amputation of 3 toes.  He will require 2 weeks of IV  antibiotics prior to bypass surgery, because he was found to have MSSA bacteremia. Plan for surgery on 9/29. Continue with IV antibiotics until he has surgery and will need reevaluation by ID at that time.  -Further  IV antibiotic therapy will need to be determined after he has his bypass and amputation.  -Continue with Plavix -Needs SNF.   2-MSSA bacteremia: [facility]. Cultures repeated subsequently has been negative. He will require IV antibiotics at discharge. PICC line placed.   Hematuria: Likely related to in and out cath. History of nephrolithiasis per CT imaging, he denies flank pain. Need follow-up UA as an outpatient.  Hypertension: Continue with metoprolol.  Coronary artery calcification on CT scan: Denies chest pain. Echo: Normal ejection fraction no wall motion abnormality, grade 2 diastolic dysfunction.  COPD: He will require formal pulmonary function test, as an outpatient. Prior CT in 2017 showed mild to moderate centrilobular emphysema with diffuse bronchial wall thickening suggestive of COPD  Tobacco abuse: Nicotine patch ordered.  Counseling provided  Severe malnutrition in context of chronic illness: Continue with nutrition supplements.   Discharge Diagnoses:  Principal Problem:   MSSA bacteremia Active Problems:   Tobacco abuse   Coronary artery calcification seen on CAT scan   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HCC)   Impaired glucose tolerance   COPD (chronic obstructive pulmonary disease) (HCC)   Protein-calorie malnutrition, severe   Acute osteomyelitis of toe of right foot (HCC)   Cellulitis of right lower extremity    Discharge Instructions  Discharge Instructions    Diet -  low sodium heart healthy   Complete by: As directed    Home infusion instructions [facility] May follow [facility] Dosing Protocol; May administer Cathflo as needed to maintain patency of vascular  access device.; Flushing of vascular access device: per [facility] Protocol: 0.9% NaCl pre/post medica...   Complete by: As directed    Instructions: May follow [facility] Dosing Protocol   Instructions: May administer Cathflo as needed to maintain patency of vascular access device.   Instructions: Flushing of vascular access device: per [facility] Protocol: 0.9% NaCl pre/post medication administration and prn patency; Heparin 100 u/ml, 44m for implanted ports and Heparin 10u/ml, 567mfor all other central venous catheters.   Instructions: May follow [facility] Anaphylaxis Protocol for First Dose Administration in the home: 0.9% NaCl at 25-50 ml/hr to maintain IV access for protocol meds. Epinephrine 0.3 ml IV/IM PRN and Benadryl 25-50 IV/IM PRN s/s of anaphylaxis.   Instructions: [facility] Coordinator (RN) to assist per patient IV care needs in the home PRN.   Increase activity slowly   Complete by: As directed      Allergies as of 08/28/2019   No Known Allergies     Medication List    TAKE these medications   acetaminophen 500 MG tablet Commonly known as: TYLENOL Take 500 mg by mouth every 8 (eight) hours as needed for moderate pain.   aspirin 81 MG EC tablet Take 1 tablet (81 mg total) by mouth daily. Start taking on: August 29, 2019   ceFAZolin  IVPB Commonly known as: ANCEF Inject 2 g into the vein every 8 (eight) hours for 24 days. Indication:  bacteremia Last Day of Therapy:  09/18/19 Labs - Once weekly:  CBC/D and BMP, Labs - Every other week:  ESR and CRP   clopidogrel 75 MG tablet Commonly known as: PLAVIX Take 1 tablet (75 mg total) by mouth daily with breakfast. Start taking on: August 29, 2019   feeding supplement (ENSURE ENLIVE) Liqd Take 237 mLs by mouth 2 (two) times daily between meals.   nutrition supplement (JUVEN) Pack Take 1 packet by mouth 2 (two) times daily between meals.   multivitamin with minerals Tabs tablet Take 1 tablet by mouth  daily. Start taking on: August 29, 2019   nicotine 21 mg/24hr patch Commonly known as: NICODERM CQ - dosed in mg/24 hours Place 1 patch (21 mg total) onto the skin daily as needed (For nicotine withdrawal symptoms.).   oxyCODONE 5 MG immediate release tablet Commonly known as: Oxy IR/ROXICODONE Take 1 tablet (5 mg total) by mouth every 4 (four) hours as needed for moderate pain.   polyethylene glycol 17 g packet Commonly known as: MIRALAX / GLYCOLAX Take 17 g by mouth daily. Start taking on: August 29, 2019   rosuvastatin 10 MG tablet Commonly known as: CRESTOR Take 1 tablet (10 mg total) by mouth daily at 6 PM.   senna 8.6 MG Tabs tablet Commonly known as: SENOKOT Take 1 tablet (8.6 mg total) by mouth at bedtime.            Home Infusion Instuctions  (From admission, onward)         Start     Ordered   08/28/19 0000  Home infusion instructions [facility] May follow [facility] Protocol; May administer Cathflo as needed to maintain patency of vascular access device.; Flushing [facility] vascular [facility]: 0.9% NaCl pre/post medica...    Question Answer Comment  Instructions May follow [facility] Dosing Protocol   Instructions May administer Cathflo as needed to maintain patency of vascular access device.   Instructions Flushing of vascular access device: per [facility] Protocol: 0.9% NaCl pre/post medication administration and prn patency; Heparin 100 u/ml, 39m for implanted ports and Heparin 10u/ml, 542mfor all other central venous catheters.   Instructions May follow [facility] Anaphylaxis Protocol for First Dose Administration in the home: 0.9% NaCl at 25-50 ml/hr to maintain IV access for protocol meds. Epinephrine 0.3 ml IV/IM PRN and Benadryl 25-50 IV/IM PRN s/s of anaphylaxis.   Instructions [facility] Infusion Coordinator (RN) to assist per patient IV care needs in the home PRN.      08/28/19 1022          No Known  Allergies  Consultations:  ID  Dr CaDonzetta Matters Procedures/Studies: Dg Tibia/fibula Right  Result Date: 08/19/2019 CLINICAL DATA:  Soft tissue infection EXAM: RIGHT TIBIA AND FIBULA - 2 VIEW COMPARISON:  None. FINDINGS: There is no acute displaced fracture. No dislocation. The soft tissues are grossly unremarkable. There may be an intra-articular loose body in the anterior joint space of the knee. IMPRESSION: Negative. Electronically Signed   By: ChConstance Holster.D.   On: 08/19/2019 19:31   Dg Chest Port 1 View  Result Date: 08/19/2019 CLINICAL DATA:  Foot infection EXAM: PORTABLE CHEST 1 VIEW COMPARISON:  Chest x-ray dated 10/10/2015 FINDINGS: There are chronic lung changes bilaterally with likely underlying emphysematous changes and scattered areas of scarring and atelectasis. There is no pneumothorax. No large pleural effusion. The heart size is stable. The aorta may be slightly dilated but is otherwise unremarkable. The lungs appear hyperexpanded. There is no acute osseous abnormality detected on this exam. There is a rounded density projecting over the left mid lung zone which is stable from prior study and is favored to represent a nipple shadow IMPRESSION: No active disease. Electronically Signed   By: ChConstance Holster.D.   On: 08/19/2019 19:25   Dg Foot Complete Right  Result Date: 08/19/2019 CLINICAL DATA:  Foot infection. EXAM: RIGHT FOOT COMPLETE - 3+ VIEW COMPARISON:  None. FINDINGS: There is osseous destruction of the distal phalanx of the first digit with an associated soft tissue defect. There is destruction or amputation of the distal phalanx of the second digit. There appears to be cortical erosions involving the middle phalanx of the second digit. Multiple ulcerations are noted along the medial aspect of the foot. There is a plantar calcaneal spur. There are degenerative changes of the midfoot. IMPRESSION: 1. Findings concerning for osteomyelitis involving the distal phalanx of  the first digit. 2. Possible osteomyelitis involving the middle phalanx of the second digit. The majority of the distal phalanx of the second digit is absent which may be postsurgical or post infectious in etiology. 3. Multiple soft tissue ulcers are noted involving the medial aspect of the foot. Electronically Signed   By: ChConstance Holster.D.   On: 08/19/2019 19:29   Vas UsKoreabBurnard Buntingith/wo Tbi  Result Date: 08/24/2019 LOWER EXTREMITY DOPPLER STUDY Indications: Gangrene, and peripheral artery disease. High Risk Factors: Diabetes, coronary artery disease.  Limitations: Today's exam was limited due to an open wound and involuntary              patient movement. Comparison Study: No prior studies. Performing Technologist: CoCarlos Leveringvt  Examination Guidelines: A complete evaluation includes at minimum, Doppler waveform signals and systolic blood pressure reading at the  level of bilateral brachial, anterior tibial, and posterior tibial arteries, when vessel segments are accessible. Bilateral testing is considered an integral part of a complete examination. Photoelectric Plethysmograph (PPG) waveforms and toe systolic pressure readings are included as required and additional duplex testing as needed. Limited examinations for reoccurring indications may be performed as noted.  ABI Findings: +--------+------------------+-----+----------+--------+ Right   Rt Pressure (mmHg)IndexWaveform  Comment  +--------+------------------+-----+----------+--------+ [id number]                     monophasic         +--------+------------------+-----+----------+--------+ PTA     86                0.49 monophasic         +--------+------------------+-----+----------+--------+ DP      91                0.52 monophasic         +--------+------------------+-----+----------+--------+ +--------+------------------+-----+----------+-------+ Left    Lt Pressure (mmHg)IndexWaveform  Comment  +--------+------------------+-----+----------+-------+ [id number]                    triphasic         +--------+------------------+-----+----------+-------+ PTA     97                0.56 monophasic        +--------+------------------+-----+----------+-------+ DP      64                0.37 monophasic        +--------+------------------+-----+----------+-------+ +-------+-----------+-----------+------------+------------+ ABI/TBIToday's ABIToday's TBIPrevious ABIPrevious TBI +-------+-----------+-----------+------------+------------+ Right  0.52                                           +-------+-----------+-----------+------------+------------+ Left   0.56                                           +-------+-----------+-----------+------------+------------+  Summary: Right: Resting right ankle-brachial index indicates moderate right lower extremity arterial disease. Unable to obtain TBI due to toe wounds. Left: Resting left ankle-brachial index indicates moderate left lower extremity arterial disease. Unable to obtain TBI due to patient movement.  *See table(s) above for measurements and observations.  Electronically signed by Harold Barban MD on 08/24/2019 at 1:12:32 AM.   Final    Vas [place] Lower Extremity Saphenous Vein Mapping  Result Date: 08/22/2019 LOWER EXTREMITY VEIN MAPPING Indications: Pre-op  Comparison Study: No prior Performing Technologist: Oda Cogan RDMS, RVT  Examination Guidelines: A complete evaluation includes B-mode imaging, spectral Doppler, color Doppler, and power Doppler as needed of all accessible portions of each vessel. Bilateral testing is considered an integral part of a complete examination. Limited examinations for reoccurring indications may be performed as noted. +---------------+-----------+----------------------+---------------+-----------+   RT Diameter  RT Findings         GSV            LT Diameter  LT Findings      (cm)                                             (cm)                  +---------------+-----------+----------------------+---------------+-----------+  0.41                     Saphenofemoral         0.59                                                   Junction                                  +---------------+-----------+----------------------+---------------+-----------+      0.37                     Proximal thigh         0.36                  +---------------+-----------+----------------------+---------------+-----------+      0.24                       Mid thigh            0.20       branching  +---------------+-----------+----------------------+---------------+-----------+      0.30                      Distal thigh          0.26       branching  +---------------+-----------+----------------------+---------------+-----------+      0.27                          Knee              0.25                  +---------------+-----------+----------------------+---------------+-----------+      0.28                       Prox calf            0.31                  +---------------+-----------+----------------------+---------------+-----------+      0.22       branching        Mid calf            0.20       branching  +---------------+-----------+----------------------+---------------+-----------+      0.25                      Distal calf           0.27                  +---------------+-----------+----------------------+---------------+-----------+      0.22       branching         Ankle              0.31                  +---------------+-----------+----------------------+---------------+-----------+ Diagnosing physician: Harold Barban MD Electronically signed by Harold Barban MD on 08/22/2019 at 1:28:19 AM.    Final    [place] Ekg Site Rite  Result Date: 08/26/2019 If Site Rite image not attached, placement could not be confirmed due to current cardiac  rhythm.   (Echo,  Carotid, EGD, Colonoscopy, ERCP)    Subjective:   Discharge Exam: Vitals:   08/28/19 0409 08/28/19 0904  BP: (!) 138/48 132/66  Pulse: 71   Resp: 16   Temp: 98.9 F (37.2 C) 98.4 F (36.9 C)  SpO2: 96% 97%     General: Pt is alert, awake, not in acute distress Cardiovascular: RRR, S1/S2 +, no rubs, no gallops Respiratory: CTA bilaterally, no wheezing, no rhonchi Abdominal: Soft, NT, ND, bowel sounds + Extremities: no edema, no cyanosis    The results of significant diagnostics from this hospitalization (including imaging, microbiology, ancillary and laboratory) are listed below for reference.     Microbiology: Recent Results (from the past 240 hour(s))  Blood Culture (routine x 2)     Status: Abnormal   Collection Time: 08/19/19  6:27 PM   Specimen: BLOOD  Result Value Ref Range Status   Specimen Description   Final    BLOOD RIGHT ANTECUBITAL Performed at [facility], [address], [place], [place] [address]    Special Requests   Final    BOTTLES DRAWN AEROBIC AND ANAEROBIC Blood Culture adequate volume Performed at [facility] [place] [facility] [place] [place], [place] [address]    Culture  Setup Time   Final    GRAM POSITIVE COCCI AEROBIC BOTTLE ONLY Gram Stain Report Called to,Read Back By and Verified With: MADISON T. AT [facility] AT [id number] ON [id number] BY THOMPSON S. Organism ID to follow CRITICAL RESULT CALLED TO, READ BACK BY AND VERIFIED WITH: Tillman Sers [facility] 08/20/19 2005 JDW Performed at [facility], [place] [address] [place], [place] [address]    Culture STAPHYLOCOCCUS AUREUS (A)  Final   Report Status 08/22/2019 FINAL  Final   Organism ID, Bacteria STAPHYLOCOCCUS AUREUS  Final      Susceptibility   Staphylococcus aureus - MIC*    CIPROFLOXACIN <=0.5 SENSITIVE Sensitive     ERYTHROMYCIN <=0.25 SENSITIVE Sensitive     GENTAMICIN <=0.5 SENSITIVE Sensitive     OXACILLIN 0.5 SENSITIVE Sensitive     TETRACYCLINE <=1 SENSITIVE Sensitive      VANCOMYCIN 1 SENSITIVE Sensitive     TRIMETH/SULFA <=10 SENSITIVE Sensitive     CLINDAMYCIN <=0.25 SENSITIVE Sensitive     RIFAMPIN <=0.5 SENSITIVE Sensitive     Inducible Clindamycin NEGATIVE Sensitive     * STAPHYLOCOCCUS AUREUS  Blood Culture ID Panel (Reflexed)     Status: Abnormal   Collection Time: 08/19/19  6:27 PM  Result Value Ref Range Status   Enterococcus species NOT DETECTED NOT DETECTED Final   Listeria monocytogenes NOT DETECTED NOT DETECTED Final   Staphylococcus species DETECTED (A) NOT DETECTED Final    Comment: CRITICAL RESULT CALLED TO, READ BACK BY AND VERIFIED WITH: Tillman Sers [facility] 08/20/19 2005 JDW    Staphylococcus aureus (BCID) DETECTED (A) NOT DETECTED Final    Comment: Methicillin (oxacillin) susceptible Staphylococcus aureus (MSSA). Preferred therapy is anti staphylococcal beta lactam antibiotic (Cefazolin or Nafcillin), unless clinically contraindicated. CRITICAL RESULT CALLED TO, READ BACK BY AND VERIFIED WITH: Tillman Sers [facility] 08/20/19 2005 JDW    Methicillin resistance NOT DETECTED NOT DETECTED Final   Streptococcus species NOT DETECTED NOT DETECTED Final   Streptococcus agalactiae NOT DETECTED NOT DETECTED Final   Streptococcus pneumoniae NOT DETECTED NOT DETECTED Final   Streptococcus pyogenes NOT DETECTED NOT DETECTED Final   Acinetobacter baumannii NOT DETECTED NOT DETECTED Final   Enterobacteriaceae species NOT DETECTED NOT DETECTED Final   Enterobacter cloacae complex NOT DETECTED NOT DETECTED  Final   Escherichia coli NOT DETECTED NOT DETECTED Final   Klebsiella oxytoca NOT DETECTED NOT DETECTED Final   Klebsiella pneumoniae NOT DETECTED NOT DETECTED Final   Proteus species NOT DETECTED NOT DETECTED Final   Serratia marcescens NOT DETECTED NOT DETECTED Final   Haemophilus influenzae NOT DETECTED NOT DETECTED Final   Neisseria meningitidis NOT DETECTED NOT DETECTED Final   Pseudomonas aeruginosa NOT DETECTED NOT DETECTED Final   Candida albicans NOT  DETECTED NOT DETECTED Final   Candida glabrata NOT DETECTED NOT DETECTED Final   Candida krusei NOT DETECTED NOT DETECTED Final   Candida parapsilosis NOT DETECTED NOT DETECTED Final   Candida tropicalis NOT DETECTED NOT DETECTED Final    Comment: Performed at [facility], [place] [address], [place], [place] [address]  Blood Culture (routine x 2)     Status: None   Collection Time: 08/19/19  7:13 PM   Specimen: BLOOD  Result Value Ref Range Status   Specimen Description BLOOD LEFT ANTECUBITAL  Final   Special Requests   Final    BOTTLES DRAWN AEROBIC AND ANAEROBIC Blood Culture adequate volume   Culture   Final    NO GROWTH 5 DAYS Performed at [facility], [address], [place], [place] [address]    Report Status 08/24/2019 FINAL  Final  Urine culture     Status: None   Collection Time: 08/19/19  8:00 PM   Specimen: In/Out Cath Urine  Result Value Ref Range Status   Specimen Description   Final    IN/OUT CATH URINE Performed at [facility], [address] [place], [place] [address]    Special Requests   Final    NONE Performed at [facility], [address] [place], [place] [address]    Culture   Final    Multiple bacterial morphotypes present, none predominant. Suggest appropriate recollection if clinically indicated.   Report Status 08/21/2019 FINAL  Final  SARS Coronavirus 2 [facility] order, Performed in [facility] hospital lab) Nasopharyngeal Nasopharyngeal Swab     Status: None   Collection Time: 08/19/19  8:11 PM   Specimen: Nasopharyngeal Swab  Result Value Ref Range Status   SARS Coronavirus 2 NEGATIVE NEGATIVE Final    Comment: (NOTE) If result is NEGATIVE SARS-CoV-2 target nucleic acids are NOT DETECTED. The SARS-CoV-2 RNA is generally detectable in upper and lower  respiratory specimens during the acute phase of infection. The lowest  concentration of SARS-CoV-2 viral copies this assay can detect is 250  copies / mL. A negative result does not preclude  SARS-CoV-2 infection  and should not be used as the sole basis for treatment or other  patient management decisions.  A negative result may occur with  improper specimen collection / handling, submission of specimen other  than nasopharyngeal swab, presence of viral mutation(s) within the  areas targeted by this assay, and inadequate number of viral copies  (<250 copies / mL). A negative result must be combined with clinical  observations, patient history, and epidemiological information. If result is POSITIVE SARS-CoV-2 target nucleic acids are DETECTED. The SARS-CoV-2 RNA is generally detectable in upper and lower  respiratory specimens dur ing the acute phase of infection.  Positive  results are indicative of active infection with SARS-CoV-2.  Clinical  correlation with patient history and other diagnostic information is  necessary to determine patient infection status.  Positive results do  not rule out bacterial infection or co-infection with other viruses. If result is PRESUMPTIVE POSTIVE  SARS-CoV-2 nucleic acids MAY BE PRESENT.   A presumptive positive result was obtained on the submitted specimen  and confirmed on repeat testing.  While 2019 novel coronavirus  (SARS-CoV-2) nucleic acids may be present in the submitted sample  additional confirmatory testing may be necessary for epidemiological  and / or clinical management purposes  to differentiate between  SARS-CoV-2 and other Sarbecovirus currently known to infect humans.  If clinically indicated additional testing with an alternate test  methodology [phone]) is advised. The SARS-CoV-2 RNA is generally  detectable in upper and lower respiratory sp ecimens during the acute  phase of infection. The expected result is Negative. Fact Sheet for Patients:  StrictlyIdeas.no Fact Sheet for Healthcare Providers: BankingDealers.co.za This test is not yet approved or cleared by the  [place] FDA and has been authorized for detection and/or diagnosis of SARS-CoV-2 by FDA under an Emergency Use Authorization (EUA).  This EUA will remain in effect (meaning this test can be used) for the duration of the COVID-19 declaration under Section 564(b)(1) of the Act, 21 U.S.C. section 360bbb-3(b)(1), unless the authorization is terminated or revoked sooner. Performed at [facility], [address], [place], [place] [address]   Culture, blood (routine x 2)     Status: None   Collection Time: 08/21/19 11:34 AM   Specimen: BLOOD RIGHT ARM  Result Value Ref Range Status   Specimen Description BLOOD RIGHT ARM  Final   Special Requests   Final    BOTTLES DRAWN AEROBIC AND ANAEROBIC Blood Culture results may not be optimal due to an excessive volume of blood received in culture bottles   Culture   Final    NO GROWTH 5 DAYS Performed at [facility], [place] [address], [place], [place] [address]    Report Status 08/26/2019 FINAL  Final  Culture, blood (routine x 2)     Status: None   Collection Time: 08/21/19 11:34 AM   Specimen: BLOOD LEFT HAND  Result Value Ref Range Status   Specimen Description BLOOD LEFT HAND  Final   Special Requests   Final    BOTTLES DRAWN AEROBIC AND ANAEROBIC Blood Culture adequate volume   Culture   Final    NO GROWTH 5 DAYS Performed at [facility], [place] [address] [place], [place] [address]    Report Status 08/26/2019 FINAL  Final     Labs: BNP (last 3 results) No results for input(s): BNP in the last 8760 hours. Basic Metabolic Panel: Recent Labs  Lab 08/22/19 0356 08/23/19 0356 08/24/19 0055 08/26/19 0506 08/27/19 0311  NA 140 137 135  --  135  K 3.7 3.5 3.1*  --  3.6  CL 104 100 97*  --  97*  CO2 '27 27 29  ' --  26  GLUCOSE 106* 92 132*  --  100*  BUN 6* 5* 10  --  9  CREATININE 0.72 0.66 0.67 0.70 0.65  CALCIUM 8.7* 8.4* 8.1*  --  8.7*   Liver Function Tests: No results for input(s): AST, ALT, ALKPHOS, BILITOT,  PROT, ALBUMIN in the last 168 hours. No results for input(s): LIPASE, AMYLASE in the last 168 hours. No results for input(s): AMMONIA in the last 168 hours. CBC: Recent Labs  Lab 08/24/19 0055 08/25/19 0419 08/26/19 0506 08/27/19 0311 08/28/19 0348  WBC 8.1 9.7 8.2 8.3 8.6  NEUTROABS 5.7 6.7 5.7 5.3 5.7  HGB 11.0* 12.0* 11.9* 11.5* 10.9*  HCT 33.0* 36.4* 35.9* 34.3* 33.8*  MCV  87.1 88.1 87.6 86.4 88.5  PLT 155 188 180 205 219   Cardiac Enzymes: No results for input(s): CKTOTAL, CKMB, CKMBINDEX, TROPONINI in the last 168 hours. BNP: Invalid input(s): POCBNP CBG: Recent Labs  Lab 08/22/19 1605 08/22/19 2115 08/23/19 0635 08/23/19 1128 08/23/19 1626  GLUCAP 121* 139* 92 139* 122*   D-Dimer No results for input(s): DDIMER in the last 72 hours. Hgb A1c No results for input(s): HGBA1C in the last 72 hours. Lipid Profile No results for input(s): CHOL, HDL, LDLCALC, TRIG, CHOLHDL, LDLDIRECT in the last 72 hours. Thyroid function studies No results for input(s): TSH, T4TOTAL, T3FREE, THYROIDAB in the last 72 hours.  Invalid input(s): FREET3 Anemia work up No results for input(s): VITAMINB12, FOLATE, FERRITIN, TIBC, IRON, RETICCTPCT in the last 72 hours. Urinalysis    Component Value Date/Time   COLORURINE YELLOW 08/20/2019 1305   APPEARANCEUR CLEAR 08/20/2019 1305   LABSPEC 1.014 08/20/2019 1305   PHURINE 6.0 08/20/2019 1305   GLUCOSEU NEGATIVE 08/20/2019 1305   HGBUR MODERATE (A) 08/20/2019 1305   BILIRUBINUR NEGATIVE 08/20/2019 1305   KETONESUR 20 (A) 08/20/2019 1305   PROTEINUR NEGATIVE 08/20/2019 1305   NITRITE NEGATIVE 08/20/2019 1305   LEUKOCYTESUR NEGATIVE 08/20/2019 1305   Sepsis Labs Invalid input(s): PROCALCITONIN,  WBC,  LACTICIDVEN Microbiology Recent Results (from the past 240 hour(s))  Blood Culture (routine x 2)     Status: Abnormal   Collection Time: 08/19/19  6:27 PM   Specimen: BLOOD  Result Value Ref Range Status   Specimen Description    Final    BLOOD RIGHT ANTECUBITAL Performed at [facility], [address] [place], [place] [address]    Special Requests   Final    BOTTLES DRAWN AEROBIC AND ANAEROBIC Blood Culture adequate volume Performed at [facility], [address] [place], [place] [address]    Culture  Setup Time   Final    GRAM POSITIVE COCCI AEROBIC BOTTLE ONLY Gram Stain Report Called to,Read Back By and Verified With: MADISON T. AT [place] AT [id number] ON [id number] BY THOMPSON S. Organism ID to follow CRITICAL RESULT CALLED TO, READ BACK BY AND VERIFIED WITH: Tillman Sers [facility] 08/20/19 2005 JDW Performed at [facility], [place] [address], [place], [place] [address]    Culture STAPHYLOCOCCUS AUREUS (A)  Final   Report Status 08/22/2019 FINAL  Final   Organism ID, Bacteria STAPHYLOCOCCUS AUREUS  Final      Susceptibility   Staphylococcus aureus - MIC*    CIPROFLOXACIN <=0.5 SENSITIVE Sensitive     ERYTHROMYCIN <=0.25 SENSITIVE Sensitive     GENTAMICIN <=0.5 SENSITIVE Sensitive     OXACILLIN 0.5 SENSITIVE Sensitive     TETRACYCLINE <=1 SENSITIVE Sensitive     VANCOMYCIN 1 SENSITIVE Sensitive     TRIMETH/SULFA <=10 SENSITIVE Sensitive     CLINDAMYCIN <=0.25 SENSITIVE Sensitive     RIFAMPIN <=0.5 SENSITIVE Sensitive     Inducible Clindamycin NEGATIVE Sensitive     * STAPHYLOCOCCUS AUREUS  Blood Culture ID Panel (Reflexed)     Status: Abnormal   Collection Time: 08/19/19  6:27 PM  Result Value Ref Range Status   Enterococcus species NOT DETECTED NOT DETECTED Final   Listeria monocytogenes NOT DETECTED NOT DETECTED Final   Staphylococcus species DETECTED (A) NOT DETECTED Final    Comment: CRITICAL RESULT CALLED TO, READ BACK BY AND VERIFIED WITH: Tillman Sers [facility] 08/20/19 2005 JDW    Staphylococcus aureus (BCID) DETECTED (A) NOT DETECTED Final    Comment:  Methicillin (oxacillin) susceptible Staphylococcus aureus (MSSA). Preferred therapy is anti staphylococcal beta lactam antibiotic (Cefazolin or Nafcillin), unless  clinically contraindicated. CRITICAL RESULT CALLED TO, READ BACK BY AND VERIFIED WITH: Tillman Sers [facility] 08/20/19 2005 JDW    Methicillin resistance NOT DETECTED NOT DETECTED Final   Streptococcus species NOT DETECTED NOT DETECTED Final   Streptococcus agalactiae NOT DETECTED NOT DETECTED Final   Streptococcus pneumoniae NOT DETECTED NOT DETECTED Final   Streptococcus pyogenes NOT DETECTED NOT DETECTED Final   Acinetobacter baumannii NOT DETECTED NOT DETECTED Final   Enterobacteriaceae species NOT DETECTED NOT DETECTED Final   Enterobacter cloacae complex NOT DETECTED NOT DETECTED Final   Escherichia coli NOT DETECTED NOT DETECTED Final   Klebsiella oxytoca NOT DETECTED NOT DETECTED Final   Klebsiella pneumoniae NOT DETECTED NOT DETECTED Final   Proteus species NOT DETECTED NOT DETECTED Final   Serratia marcescens NOT DETECTED NOT DETECTED Final   Haemophilus influenzae NOT DETECTED NOT DETECTED Final   Neisseria meningitidis NOT DETECTED NOT DETECTED Final   Pseudomonas aeruginosa NOT DETECTED NOT DETECTED Final   Candida albicans NOT DETECTED NOT DETECTED Final   Candida glabrata NOT DETECTED NOT DETECTED Final   Candida krusei NOT DETECTED NOT DETECTED Final   Candida parapsilosis NOT DETECTED NOT DETECTED Final   Candida tropicalis NOT DETECTED NOT DETECTED Final    Comment: Performed at [facility], [place], [place], [place] [address]  Blood Culture (routine x 2)     Status: None   Collection Time: 08/19/19  7:13 PM   Specimen: BLOOD  Result Value Ref Range Status   Specimen Description BLOOD LEFT ANTECUBITAL  Final   Special Requests   Final    BOTTLES DRAWN AEROBIC AND ANAEROBIC Blood Culture adequate volume   Culture   Final    NO GROWTH 5 DAYS Performed at [facility], [address], [place], [place] [address]    Report Status 08/24/2019 FINAL  Final  Urine culture     Status: None   Collection Time: 08/19/19  8:00 PM   Specimen: In/Out Cath Urine  Result  Value Ref Range Status   Specimen Description   Final    IN/OUT CATH URINE Performed at [facility], [address] [place], [place] [address]    Special Requests   Final    NONE Performed at [facility], [address] [place], [place] [address]    Culture   Final    Multiple bacterial morphotypes present, none predominant. Suggest appropriate recollection if clinically indicated.   Report Status 08/21/2019 FINAL  Final  SARS Coronavirus 2 [facility] order, Performed in [facility] hospital lab) Nasopharyngeal Nasopharyngeal Swab     Status: None   Collection Time: 08/19/19  8:11 PM   Specimen: Nasopharyngeal Swab  Result Value Ref Range Status   SARS Coronavirus 2 NEGATIVE NEGATIVE Final    Comment: (NOTE) If result is NEGATIVE SARS-CoV-2 target nucleic acids are NOT DETECTED. The SARS-CoV-2 RNA is generally detectable in upper and lower  respiratory specimens during the acute phase of infection. The lowest  concentration of SARS-CoV-2 viral copies this assay can detect is 250  copies / mL. A negative result does not preclude SARS-CoV-2 infection  and should not be used as the sole basis for treatment or other  patient management decisions.  A negative result may occur with  improper specimen collection / handling, submission of specimen other  than nasopharyngeal swab, presence of viral mutation(s) within the  areas targeted  by this assay, and inadequate number of viral copies  (<250 copies / mL). A negative result must be combined with clinical  observations, patient history, and epidemiological information. If result is POSITIVE SARS-CoV-2 target nucleic acids are DETECTED. The SARS-CoV-2 RNA is generally detectable in upper and lower  respiratory specimens dur ing the acute phase of infection.  Positive  results are indicative of active infection with SARS-CoV-2.  Clinical  correlation with patient history and other diagnostic information is  necessary to determine  patient infection status.  Positive results do  not rule out bacterial infection or co-infection with other viruses. If result is PRESUMPTIVE POSTIVE SARS-CoV-2 nucleic acids MAY BE PRESENT.   A presumptive positive result was obtained on the submitted specimen  and confirmed on repeat testing.  While 2019 novel coronavirus  (SARS-CoV-2) nucleic acids may be present in the submitted sample  additional confirmatory testing may be necessary for epidemiological  and / or clinical management purposes  to differentiate between  SARS-CoV-2 and other Sarbecovirus currently known to infect humans.  If clinically indicated additional testing with an alternate test  methodology [phone]) is advised. The SARS-CoV-2 RNA is generally  detectable in upper and lower respiratory sp ecimens during the acute  phase of infection. The expected result is Negative. Fact Sheet for Patients:  StrictlyIdeas.no Fact Sheet for Healthcare Providers: BankingDealers.co.za This test is not yet approved or cleared by the [place] FDA and has been authorized for detection and/or diagnosis of SARS-CoV-2 by FDA under an Emergency Use Authorization (EUA).  This EUA will remain in effect (meaning this test can be used) for the duration of the COVID-19 declaration under Section 564(b)(1) of the Act, 21 U.S.C. section 360bbb-3(b)(1), unless the authorization is terminated or revoked sooner. Performed at [facility], [address], [place], [place] [address]   Culture, blood (routine x 2)     Status: None   Collection Time: 08/21/19 11:34 AM   Specimen: BLOOD RIGHT ARM  Result Value Ref Range Status   Specimen Description BLOOD RIGHT ARM  Final   Special Requests   Final    BOTTLES DRAWN AEROBIC AND ANAEROBIC Blood Culture results may not be optimal due to an excessive volume of blood received in culture bottles   Culture   Final    NO GROWTH 5 DAYS Performed at [facility], [place] [address] [place], [place] [address]    Report Status 08/26/2019 FINAL  Final  Culture, blood (routine x 2)     Status: None   Collection Time: 08/21/19 11:34 AM   Specimen: BLOOD LEFT HAND  Result Value Ref Range Status   Specimen Description BLOOD LEFT HAND  Final   Special Requests   Final    BOTTLES DRAWN AEROBIC AND ANAEROBIC Blood Culture adequate volume   Culture   Final    NO GROWTH 5 DAYS Performed at [facility], [place] [address], [place], [place] [address]    Report Status 08/26/2019 FINAL  Final     Time coordinating discharge: 40 minutes  SIGNED:   Elmarie Shiley, MD  [facility]

## 2019-08-29 DIAGNOSIS — E11621 Type 2 diabetes mellitus with foot ulcer: Secondary | ICD-10-CM | POA: Diagnosis not present

## 2019-08-29 DIAGNOSIS — Z961 Presence of intraocular lens: Secondary | ICD-10-CM | POA: Diagnosis not present

## 2019-08-29 DIAGNOSIS — Z01812 Encounter for preprocedural laboratory examination: Secondary | ICD-10-CM | POA: Diagnosis not present

## 2019-08-29 DIAGNOSIS — Z8631 Personal history of diabetic foot ulcer: Secondary | ICD-10-CM | POA: Diagnosis not present

## 2019-08-29 DIAGNOSIS — F17211 Nicotine dependence, cigarettes, in remission: Secondary | ICD-10-CM | POA: Diagnosis not present

## 2019-08-29 DIAGNOSIS — M86171 Other acute osteomyelitis, right ankle and foot: Secondary | ICD-10-CM | POA: Diagnosis not present

## 2019-08-29 DIAGNOSIS — R279 Unspecified lack of coordination: Secondary | ICD-10-CM | POA: Diagnosis not present

## 2019-08-29 DIAGNOSIS — J449 Chronic obstructive pulmonary disease, unspecified: Secondary | ICD-10-CM | POA: Diagnosis not present

## 2019-08-29 DIAGNOSIS — N200 Calculus of kidney: Secondary | ICD-10-CM | POA: Diagnosis not present

## 2019-08-29 DIAGNOSIS — Z9842 Cataract extraction status, left eye: Secondary | ICD-10-CM | POA: Diagnosis not present

## 2019-08-29 DIAGNOSIS — R7881 Bacteremia: Secondary | ICD-10-CM | POA: Diagnosis not present

## 2019-08-29 DIAGNOSIS — L97519 Non-pressure chronic ulcer of other part of right foot with unspecified severity: Secondary | ICD-10-CM | POA: Diagnosis not present

## 2019-08-29 DIAGNOSIS — Z8249 Family history of ischemic heart disease and other diseases of the circulatory system: Secondary | ICD-10-CM | POA: Diagnosis not present

## 2019-08-29 DIAGNOSIS — I998 Other disorder of circulatory system: Secondary | ICD-10-CM | POA: Diagnosis not present

## 2019-08-29 DIAGNOSIS — Z743 Need for continuous supervision: Secondary | ICD-10-CM | POA: Diagnosis not present

## 2019-08-29 DIAGNOSIS — Z95820 Peripheral vascular angioplasty status with implants and grafts: Secondary | ICD-10-CM | POA: Diagnosis not present

## 2019-08-29 DIAGNOSIS — I97618 Postprocedural hemorrhage and hematoma of a circulatory system organ or structure following other circulatory system procedure: Secondary | ICD-10-CM | POA: Diagnosis not present

## 2019-08-29 DIAGNOSIS — I6529 Occlusion and stenosis of unspecified carotid artery: Secondary | ICD-10-CM | POA: Diagnosis not present

## 2019-08-29 DIAGNOSIS — Z20828 Contact with and (suspected) exposure to other viral communicable diseases: Secondary | ICD-10-CM | POA: Diagnosis present

## 2019-08-29 DIAGNOSIS — A4901 Methicillin susceptible Staphylococcus aureus infection, unspecified site: Secondary | ICD-10-CM | POA: Diagnosis not present

## 2019-08-29 DIAGNOSIS — R5381 Other malaise: Secondary | ICD-10-CM | POA: Diagnosis not present

## 2019-08-29 DIAGNOSIS — L97509 Non-pressure chronic ulcer of other part of unspecified foot with unspecified severity: Secondary | ICD-10-CM | POA: Diagnosis not present

## 2019-08-29 DIAGNOSIS — M6281 Muscle weakness (generalized): Secondary | ICD-10-CM | POA: Diagnosis not present

## 2019-08-29 DIAGNOSIS — E43 Unspecified severe protein-calorie malnutrition: Secondary | ICD-10-CM | POA: Diagnosis not present

## 2019-08-29 DIAGNOSIS — I10 Essential (primary) hypertension: Secondary | ICD-10-CM | POA: Diagnosis not present

## 2019-08-29 DIAGNOSIS — I96 Gangrene, not elsewhere classified: Secondary | ICD-10-CM | POA: Diagnosis not present

## 2019-08-29 DIAGNOSIS — Z9841 Cataract extraction status, right eye: Secondary | ICD-10-CM | POA: Diagnosis not present

## 2019-08-29 DIAGNOSIS — L03115 Cellulitis of right lower limb: Secondary | ICD-10-CM | POA: Diagnosis not present

## 2019-08-29 DIAGNOSIS — E785 Hyperlipidemia, unspecified: Secondary | ICD-10-CM | POA: Diagnosis not present

## 2019-08-29 DIAGNOSIS — I2510 Atherosclerotic heart disease of native coronary artery without angina pectoris: Secondary | ICD-10-CM | POA: Diagnosis not present

## 2019-08-29 DIAGNOSIS — I97638 Postprocedural hematoma of a circulatory system organ or structure following other circulatory system procedure: Secondary | ICD-10-CM | POA: Diagnosis not present

## 2019-08-29 DIAGNOSIS — R319 Hematuria, unspecified: Secondary | ICD-10-CM | POA: Diagnosis not present

## 2019-08-29 DIAGNOSIS — I70261 Atherosclerosis of native arteries of extremities with gangrene, right leg: Secondary | ICD-10-CM | POA: Diagnosis not present

## 2019-08-29 DIAGNOSIS — N529 Male erectile dysfunction, unspecified: Secondary | ICD-10-CM | POA: Diagnosis present

## 2019-08-29 DIAGNOSIS — Z79899 Other long term (current) drug therapy: Secondary | ICD-10-CM | POA: Diagnosis not present

## 2019-08-29 DIAGNOSIS — Y92234 Operating room of hospital as the place of occurrence of the external cause: Secondary | ICD-10-CM | POA: Diagnosis not present

## 2019-08-29 DIAGNOSIS — Y838 Other surgical procedures as the cause of abnormal reaction of the patient, or of later complication, without mention of misadventure at the time of the procedure: Secondary | ICD-10-CM | POA: Diagnosis not present

## 2019-08-29 DIAGNOSIS — E1169 Type 2 diabetes mellitus with other specified complication: Secondary | ICD-10-CM | POA: Diagnosis not present

## 2019-08-29 DIAGNOSIS — Z89429 Acquired absence of other toe(s), unspecified side: Secondary | ICD-10-CM | POA: Diagnosis not present

## 2019-08-29 DIAGNOSIS — R7302 Impaired glucose tolerance (oral): Secondary | ICD-10-CM | POA: Diagnosis not present

## 2019-08-29 DIAGNOSIS — Z9852 Vasectomy status: Secondary | ICD-10-CM | POA: Diagnosis not present

## 2019-08-29 DIAGNOSIS — Z720 Tobacco use: Secondary | ICD-10-CM | POA: Diagnosis not present

## 2019-08-29 DIAGNOSIS — I739 Peripheral vascular disease, unspecified: Secondary | ICD-10-CM | POA: Diagnosis not present

## 2019-08-29 DIAGNOSIS — I959 Hypotension, unspecified: Secondary | ICD-10-CM | POA: Diagnosis not present

## 2019-08-29 DIAGNOSIS — Z87891 Personal history of nicotine dependence: Secondary | ICD-10-CM | POA: Diagnosis not present

## 2019-08-29 LAB — CBC WITH DIFFERENTIAL/PLATELET
Abs Immature Granulocytes: 0.07 10*3/uL (ref 0.00–0.07)
Basophils Absolute: 0.1 10*3/uL (ref 0.0–0.1)
Basophils Relative: 1 %
Eosinophils Absolute: 0.2 10*3/uL (ref 0.0–0.5)
Eosinophils Relative: 2 %
HCT: 34.1 % — ABNORMAL LOW (ref 39.0–52.0)
Hemoglobin: 11.1 g/dL — ABNORMAL LOW (ref 13.0–17.0)
Immature Granulocytes: 1 %
Lymphocytes Relative: 20 %
Lymphs Abs: 2 10*3/uL (ref 0.7–4.0)
MCH: 29 pg (ref 26.0–34.0)
MCHC: 32.6 g/dL (ref 30.0–36.0)
MCV: 89 fL (ref 80.0–100.0)
Monocytes Absolute: 1 10*3/uL (ref 0.1–1.0)
Monocytes Relative: 10 %
Neutro Abs: 6.6 10*3/uL (ref 1.7–7.7)
Neutrophils Relative %: 66 %
Platelets: 247 10*3/uL (ref 150–400)
RBC: 3.83 MIL/uL — ABNORMAL LOW (ref 4.22–5.81)
RDW: 13.2 % (ref 11.5–15.5)
WBC: 9.9 10*3/uL (ref 4.0–10.5)
nRBC: 0 % (ref 0.0–0.2)

## 2019-08-29 MED ORDER — POTASSIUM CHLORIDE CRYS ER 20 MEQ PO TBCR
20.0000 meq | EXTENDED_RELEASE_TABLET | Freq: Once | ORAL | Status: AC
  Administered 2019-08-29: 20 meq via ORAL
  Filled 2019-08-29: qty 1

## 2019-08-29 MED ORDER — HEPARIN SOD (PORK) LOCK FLUSH 100 UNIT/ML IV SOLN
250.0000 [IU] | INTRAVENOUS | Status: AC | PRN
  Administered 2019-08-29: 250 [IU]

## 2019-08-29 NOTE — Progress Notes (Signed)
Above entry was recorded on the wrong chart.

## 2019-08-29 NOTE — Progress Notes (Signed)
Pt DC to [facility] via [facility]. VSS. [employer], patient's niece notified by phone, and a message left at [facility] answering machine, patient's sister. [place] notified.

## 2019-08-29 NOTE — Discharge Summary (Addendum)
Physician Discharge Summary  Raymond Walker. FIE:[id number] DOB: 06-01-1951 DOA: 08/19/2019  PCP: [name], MD  Admit date: 08/19/2019 Discharge date: 08/29/2019  Admitted From: Home  Disposition:  SNF  Recommendations for Outpatient Follow-up:  1. Follow up with PCP in 1-2 weeks 2. Please obtain BMP/CBC in one week 3. Bypass surgery and amputation planned for 9-29 by Dr Donzetta Matters.  4. Needs to be on IV antibiotics at least until after surgery and will need reevaluation by ID after surgery in regards Length of treatment of antibiotics.     Discharge Condition: Stable CODE STATUS: Full code Diet recommendation: Heart Healthy   Brief/Interim Summary: 68 year old with past medical history significant for tobacco abuse, glucose intolerance, orchiectomy and vasectomy, rectal dysfunction, nephrolithiasis who was not on any medication prior to admission presented to the ED complaining of blackening and ulcer on the toes of his right foot which has been progressing for about 2 months.  He has not seen his primary care doctor for this problem.  Evaluation in the ED patient was noted to be febrile with temperature 102 lactic acid 2.3 white count 12.  Pedal pulses were not able to be found with Dopplers.  Underwent angiogram on 9/3 and stenting.  Plan was for right femoral bypass and then amputation of the toes however he was found to have MSSA bacteremia.  ID was consulted and is recommending 2 weeks of IV antibiotics prior to bypass surgery.  1-Foot gangrene and cellulitis with severe sepsis, fever and leukocytosis Pain 19, ESR 24. -Patient was a started on aspirin and Crestor.  He has received morphine and oxycodone for pain control. -Vascular surgery was consulted and patient underwent on 9/3 right common iliac stent, right external iliac angioplasty with stent placement. -Patient will require bypass surgery of the right femoral, along with amputation of 3 toes.  He will require 2 weeks of IV  antibiotics prior to bypass surgery, because he was found to have MSSA bacteremia. Plan for surgery on 9/29. Continue with IV antibiotics until he has surgery and will need reevaluation by ID at that time.  -Further  IV antibiotic therapy will need to be determined after he has his bypass and amputation.  -Continue with Plavix -Needs SNF.  Medical stable. COVID test came back negative,.  Plan to discharge today.   2-MSSA bacteremia: [facility]. Cultures repeated subsequently has been negative. He will require IV antibiotics at discharge. PICC line placed.  Called by nurse, patient BP was 80 taking on his wrist. Repeated BP was 121/72. Reviewed telemetry he only has sinus arrhythmia and PAC. No evidence of significant pauses or bradycardia. Discussed with central telemetry, patient has not had pauses or bradycardia.   Hematuria: Likely related to in and out cath. History of nephrolithiasis per CT imaging, he denies flank pain. Need follow-up UA as an outpatient.  Hypertension: Continue with metoprolol.  Coronary artery calcification on CT scan: Denies chest pain. Echo: Normal ejection fraction no wall motion abnormality, grade 2 diastolic dysfunction.  COPD: He will require formal pulmonary function test, as an outpatient. Prior CT in 2017 showed mild to moderate centrilobular emphysema with diffuse bronchial wall thickening suggestive of COPD  Tobacco abuse: Nicotine patch ordered.  Counseling provided  Severe malnutrition in context of chronic illness: Continue with nutrition supplements.   Discharge Diagnoses:  Principal Problem:   MSSA bacteremia Active Problems:   Tobacco abuse   Coronary artery calcification seen on CAT  scan   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HCC)   Impaired glucose tolerance   COPD (chronic obstructive pulmonary disease) (HCC)   Protein-calorie malnutrition, severe   Acute osteomyelitis of  toe of right foot (HCC)   Cellulitis of right lower extremity    Discharge Instructions  Discharge Instructions    Diet - low sodium heart healthy   Complete by: As directed    Diet - low sodium heart healthy   Complete by: As directed    Home infusion instructions [facility] May follow [place] Dosing Protocol; May administer Cathflo as needed to maintain patency of vascular access device.; Flushing of vascular access device: per [facility] Protocol: 0.9% NaCl pre/post medica...   Complete by: As directed    Instructions: May follow [place] Dosing Protocol   Instructions: May administer Cathflo as needed to maintain patency of vascular access device.   Instructions: Flushing of vascular access device: per [facility] Protocol: 0.9% NaCl pre/post medication administration and prn patency; Heparin 100 u/ml, 55m for implanted ports and Heparin 10u/ml, 520mfor all other central venous catheters.   Instructions: May follow [facility] Anaphylaxis Protocol for First Dose Administration in the home: 0.9% NaCl at 25-50 ml/hr to maintain IV access for protocol meds. Epinephrine 0.3 ml IV/IM PRN and Benadryl 25-50 IV/IM PRN s/s of anaphylaxis.   Instructions: [facility] Coordinator (RN) to assist per patient IV care needs in the home PRN.   Increase activity slowly   Complete by: As directed    Increase activity slowly   Complete by: As directed      Allergies as of 08/29/2019   No Known Allergies     Medication List    TAKE these medications   acetaminophen 500 MG tablet Commonly known as: TYLENOL Take 500 mg by mouth every 8 (eight) hours as needed for moderate pain.   aspirin 81 MG EC tablet Take 1 tablet (81 mg total) by mouth daily.   ceFAZolin  IVPB Commonly known as: ANCEF Inject 2 g into the vein every 8 (eight) hours for 24 days. Indication:  bacteremia Last Day of Therapy:  09/18/19 Labs - Once weekly:  CBC/D and BMP, Labs - Every other week:  ESR and CRP    clopidogrel 75 MG tablet Commonly known as: PLAVIX Take 1 tablet (75 mg total) by mouth daily with breakfast.   feeding supplement (ENSURE ENLIVE) Liqd Take 237 mLs by mouth 2 (two) times daily between meals.   nutrition supplement (JUVEN) Pack Take 1 packet by mouth 2 (two) times daily between meals.   multivitamin with minerals Tabs tablet Take 1 tablet by mouth daily.   nicotine 21 mg/24hr patch Commonly known as: NICODERM CQ - dosed in mg/24 hours Place 1 patch (21 mg total) onto the skin daily as needed (For nicotine withdrawal symptoms.).   oxyCODONE 5 MG immediate release tablet Commonly known as: Oxy IR/ROXICODONE Take 1 tablet (5 mg total) by mouth every 4 (four) hours as needed for moderate pain.   polyethylene glycol 17 g packet Commonly known as: MIRALAX / GLYCOLAX Take 17 g by mouth daily.   rosuvastatin 10 MG tablet Commonly known as: CRESTOR Take 1 tablet (10 mg total) by mouth daily at 6 PM.   senna 8.6 MG Tabs tablet Commonly known as: SENOKOT Take 1 tablet (8.6 mg total) by mouth at bedtime.            Home Infusion Instuctions  (From admission, onward)  Start     Ordered   08/28/19 0000  Home infusion instructions [facility] May follow [place] Dosing Protocol; May administer Cathflo as needed to maintain patency of vascular access device.; Flushing of vascular access device: per [facility] Protocol: 0.9% NaCl pre/post medica...    Question Answer Comment  Instructions May follow [facility] Dosing Protocol   Instructions May administer Cathflo as needed to maintain patency of vascular access device.   Instructions Flushing of vascular access device: per [facility] Protocol: 0.9% NaCl pre/post medication administration and prn patency; Heparin 100 u/ml, 12m for implanted ports and Heparin 10u/ml, 59mfor all other central venous catheters.   Instructions May follow [facility] Anaphylaxis Protocol for First Dose Administration in the home: 0.9% NaCl  at 25-50 ml/hr to maintain IV access for protocol meds. Epinephrine 0.3 ml IV/IM PRN and Benadryl 25-50 IV/IM PRN s/s of anaphylaxis.   Instructions [facility] Infusion Coordinator (RN) to assist per patient IV care needs in the home PRN.      08/28/19 1022          No Known Allergies  Consultations:  ID  Dr CaDonzetta Matters Procedures/Studies: Dg Tibia/fibula Right  Result Date: 08/19/2019 CLINICAL DATA:  Soft tissue infection EXAM: RIGHT TIBIA AND FIBULA - 2 VIEW COMPARISON:  None. FINDINGS: There is no acute displaced fracture. No dislocation. The soft tissues are grossly unremarkable. There may be an intra-articular loose body in the anterior joint space of the knee. IMPRESSION: Negative. Electronically Signed   By: ChConstance Holster.D.   On: 08/19/2019 19:31   Dg Chest Port 1 View  Result Date: 08/19/2019 CLINICAL DATA:  Foot infection EXAM: PORTABLE CHEST 1 VIEW COMPARISON:  Chest x-ray dated 10/10/2015 FINDINGS: There are chronic lung changes bilaterally with likely underlying emphysematous changes and scattered areas of scarring and atelectasis. There is no pneumothorax. No large pleural effusion. The heart size is stable. The aorta may be slightly dilated but is otherwise unremarkable. The lungs appear hyperexpanded. There is no acute osseous abnormality detected on this exam. There is a rounded density projecting over the left mid lung zone which is stable from prior study and is favored to represent a nipple shadow IMPRESSION: No active disease. Electronically Signed   By: ChConstance Holster.D.   On: 08/19/2019 19:25   Dg Foot Complete Right  Result Date: 08/19/2019 CLINICAL DATA:  Foot infection. EXAM: RIGHT FOOT COMPLETE - 3+ VIEW COMPARISON:  None. FINDINGS: There is osseous destruction of the distal phalanx of the first digit with an associated soft tissue defect. There is destruction or amputation of the distal phalanx of the second digit. There appears to be cortical  erosions involving the middle phalanx of the second digit. Multiple ulcerations are noted along the medial aspect of the foot. There is a plantar calcaneal spur. There are degenerative changes of the midfoot. IMPRESSION: 1. Findings concerning for osteomyelitis involving the distal phalanx of the first digit. 2. Possible osteomyelitis involving the middle phalanx of the second digit. The majority of the distal phalanx of the second digit is absent which may be postsurgical or post infectious in etiology. 3. Multiple soft tissue ulcers are noted involving the medial aspect of the foot. Electronically Signed   By: ChConstance Holster.D.   On: 08/19/2019 19:29   Vas Us[place]oreabBurnard Buntingith/wo Tbi  Result Date: 08/24/2019 LOWER EXTREMITY DOPPLER STUDY Indications: Gangrene, and peripheral artery disease. High Risk Factors: Diabetes, coronary artery disease.  Limitations: Today's  exam was limited due to an open wound and involuntary              patient movement. Comparison Study: No prior studies. Performing Technologist: Carlos Levering Rvt  Examination Guidelines: A complete evaluation includes at minimum, Doppler waveform signals and systolic blood pressure reading at the level of bilateral brachial, anterior tibial, and posterior tibial arteries, when vessel segments are accessible. Bilateral testing is considered an integral part of a complete examination. Photoelectric Plethysmograph (PPG) waveforms and toe systolic pressure readings are included as required and additional duplex testing as needed. Limited examinations for reoccurring indications may be performed as noted.  ABI Findings: +--------+------------------+-----+----------+--------+ Right   Rt Pressure (mmHg)IndexWaveform  Comment  +--------+------------------+-----+----------+--------+ [id number]                     monophasic         +--------+------------------+-----+----------+--------+ PTA     86                0.49 monophasic          +--------+------------------+-----+----------+--------+ DP      91                0.52 monophasic         +--------+------------------+-----+----------+--------+ +--------+------------------+-----+----------+-------+ Left    Lt Pressure (mmHg)IndexWaveform  Comment +--------+------------------+-----+----------+-------+ [id number]                    triphasic         +--------+------------------+-----+----------+-------+ PTA     97                0.56 monophasic        +--------+------------------+-----+----------+-------+ DP      64                0.37 monophasic        +--------+------------------+-----+----------+-------+ +-------+-----------+-----------+------------+------------+ ABI/TBIToday's ABIToday's TBIPrevious ABIPrevious TBI +-------+-----------+-----------+------------+------------+ Right  0.52                                           +-------+-----------+-----------+------------+------------+ Left   0.56                                           +-------+-----------+-----------+------------+------------+  Summary: Right: Resting right ankle-brachial index indicates moderate right lower extremity arterial disease. Unable to obtain TBI due to toe wounds. Left: Resting left ankle-brachial index indicates moderate left lower extremity arterial disease. Unable to obtain TBI due to patient movement.  *See table(s) above for measurements and observations.  Electronically signed by Harold Barban MD on 08/24/2019 at 1:12:32 AM.   Final    Vas [place] Lower Extremity Saphenous Vein Mapping  Result Date: 08/22/2019 LOWER EXTREMITY VEIN MAPPING Indications: Pre-op  Comparison Study: No prior Performing Technologist: Oda Cogan RDMS, RVT  Examination Guidelines: A complete evaluation includes B-mode imaging, spectral Doppler, color Doppler, and power Doppler as needed of all accessible portions of each vessel. Bilateral testing is considered an integral part of a  complete examination. Limited examinations for reoccurring indications may be performed as noted. +---------------+-----------+----------------------+---------------+-----------+   RT Diameter  RT Findings         GSV            LT Diameter  LT Findings      (  cm)                                            (cm)                  +---------------+-----------+----------------------+---------------+-----------+      0.41                     Saphenofemoral         0.59                                                   Junction                                  +---------------+-----------+----------------------+---------------+-----------+      0.37                     Proximal thigh         0.36                  +---------------+-----------+----------------------+---------------+-----------+      0.24                       Mid thigh            0.20       branching  +---------------+-----------+----------------------+---------------+-----------+      0.30                      Distal thigh          0.26       branching  +---------------+-----------+----------------------+---------------+-----------+      0.27                          Knee              0.25                  +---------------+-----------+----------------------+---------------+-----------+      0.28                       Prox calf            0.31                  +---------------+-----------+----------------------+---------------+-----------+      0.22       branching        Mid calf            0.20       branching  +---------------+-----------+----------------------+---------------+-----------+      0.25                      Distal calf           0.27                  +---------------+-----------+----------------------+---------------+-----------+      0.22       branching         Ankle              0.31                   +---------------+-----------+----------------------+---------------+-----------+  Diagnosing physician: Harold Barban MD Electronically signed by Harold Barban MD on 08/22/2019 at 1:28:19 AM.    Final    [place] Ekg Site Rite  Result Date: 08/26/2019 If Site Rite image not attached, placement could not be confirmed due to current cardiac rhythm.  (Echo, Carotid, EGD, Colonoscopy, ERCP)    Subjective:   Discharge Exam: Vitals:   08/28/19 2033 08/29/19 0328  BP: 105/67 136/70  Pulse: 72 75  Resp: 14 18  Temp: 98.6 F (37 C) 99.2 F (37.3 C)  SpO2: 100% 95%     General: Pt is alert, awake, not in acute distress Cardiovascular: RRR, S1/S2 +, no rubs, no gallops Respiratory: CTA bilaterally, no wheezing, no rhonchi Abdominal: Soft, NT, ND, bowel sounds + Extremities: no edema, no cyanosis    The results of significant diagnostics from this hospitalization (including imaging, microbiology, ancillary and laboratory) are listed below for reference.     Microbiology: Recent Results (from the past 240 hour(s))  Blood Culture (routine x 2)     Status: Abnormal   Collection Time: 08/19/19  6:27 PM   Specimen: BLOOD  Result Value Ref Range Status   Specimen Description   Final    BLOOD RIGHT ANTECUBITAL Performed at [facility], [address] [place], [place] [address]    Special Requests   Final    BOTTLES DRAWN AEROBIC AND ANAEROBIC Blood Culture adequate volume Performed at [place] [place], [place] [address]    Culture  Setup Time   Final    GRAM POSITIVE COCCI AEROBIC BOTTLE ONLY Gram Stain Report Called to,Read Back By and Verified With: MADISON T. AT [facility] AT [id number] ON [id number] BY THOMPSON S. Organism ID to follow CRITICAL RESULT CALLED TO, READ BACK BY AND VERIFIED WITH: Tillman Sers [facility] 08/20/19 2005 JDW Performed at [facility], [place] [address], [place], [place] [address]    Culture STAPHYLOCOCCUS AUREUS (A)  Final   Report Status 08/22/2019 FINAL   Final   Organism ID, Bacteria STAPHYLOCOCCUS AUREUS  Final      Susceptibility   Staphylococcus aureus - MIC*    CIPROFLOXACIN <=0.5 SENSITIVE Sensitive     ERYTHROMYCIN <=0.25 SENSITIVE Sensitive     GENTAMICIN <=0.5 SENSITIVE Sensitive     OXACILLIN 0.5 SENSITIVE Sensitive     TETRACYCLINE <=1 SENSITIVE Sensitive     VANCOMYCIN 1 SENSITIVE Sensitive     TRIMETH/SULFA <=10 SENSITIVE Sensitive     CLINDAMYCIN <=0.25 SENSITIVE Sensitive     RIFAMPIN <=0.5 SENSITIVE Sensitive     Inducible Clindamycin NEGATIVE Sensitive     * STAPHYLOCOCCUS AUREUS  Blood Culture ID Panel (Reflexed)     Status: Abnormal   Collection Time: 08/19/19  6:27 PM  Result Value Ref Range Status   Enterococcus species NOT DETECTED NOT DETECTED Final   Listeria monocytogenes NOT DETECTED NOT DETECTED Final   Staphylococcus species DETECTED (A) NOT DETECTED Final    Comment: CRITICAL RESULT CALLED TO, READ BACK BY AND VERIFIED WITH: Tillman Sers [facility] 08/20/19 2005 JDW    Staphylococcus aureus (BCID) DETECTED (A) NOT DETECTED Final    Comment: Methicillin (oxacillin) susceptible Staphylococcus aureus (MSSA). Preferred therapy is anti staphylococcal beta lactam antibiotic (Cefazolin or Nafcillin), unless clinically contraindicated. CRITICAL RESULT CALLED TO, READ BACK BY AND VERIFIED WITH: Tillman Sers [facility] 08/20/19 2005 JDW    Methicillin resistance NOT DETECTED NOT DETECTED Final   Streptococcus species NOT DETECTED NOT DETECTED Final   Streptococcus agalactiae NOT DETECTED  NOT DETECTED Final   Streptococcus pneumoniae NOT DETECTED NOT DETECTED Final   Streptococcus pyogenes NOT DETECTED NOT DETECTED Final   Acinetobacter baumannii NOT DETECTED NOT DETECTED Final   Enterobacteriaceae species NOT DETECTED NOT DETECTED Final   Enterobacter cloacae complex NOT DETECTED NOT DETECTED Final   Escherichia coli NOT DETECTED NOT DETECTED Final   Klebsiella oxytoca NOT DETECTED NOT DETECTED Final   Klebsiella pneumoniae NOT  DETECTED NOT DETECTED Final   Proteus species NOT DETECTED NOT DETECTED Final   Serratia marcescens NOT DETECTED NOT DETECTED Final   Haemophilus influenzae NOT DETECTED NOT DETECTED Final   Neisseria meningitidis NOT DETECTED NOT DETECTED Final   Pseudomonas aeruginosa NOT DETECTED NOT DETECTED Final   Candida albicans NOT DETECTED NOT DETECTED Final   Candida glabrata NOT DETECTED NOT DETECTED Final   Candida krusei NOT DETECTED NOT DETECTED Final   Candida parapsilosis NOT DETECTED NOT DETECTED Final   Candida tropicalis NOT DETECTED NOT DETECTED Final    Comment: Performed at [facility], [place] [address], [place], [place] [address]  Blood Culture (routine x 2)     Status: None   Collection Time: 08/19/19  7:13 PM   Specimen: BLOOD  Result Value Ref Range Status   Specimen Description BLOOD LEFT ANTECUBITAL  Final   Special Requests   Final    BOTTLES DRAWN AEROBIC AND ANAEROBIC Blood Culture adequate volume   Culture   Final    NO GROWTH 5 DAYS Performed at [facility], [address], [place], [place] [address]    Report Status 08/24/2019 FINAL  Final  Urine culture     Status: None   Collection Time: 08/19/19  8:00 PM   Specimen: In/Out Cath Urine  Result Value Ref Range Status   Specimen Description   Final    IN/OUT CATH URINE Performed at [facility], [address] [place], [place] [address]    Special Requests   Final    NONE Performed at [facility], [address] [place], [place] [address]    Culture   Final    Multiple bacterial morphotypes present, none predominant. Suggest appropriate recollection if clinically indicated.   Report Status 08/21/2019 FINAL  Final  SARS Coronavirus 2 [facility] order, Performed in [facility] hospital lab) Nasopharyngeal Nasopharyngeal Swab     Status: None   Collection Time: 08/19/19  8:11 PM   Specimen: Nasopharyngeal Swab  Result Value Ref Range Status   SARS Coronavirus 2 NEGATIVE NEGATIVE Final    Comment:  (NOTE) If result is NEGATIVE SARS-CoV-2 target nucleic acids are NOT DETECTED. The SARS-CoV-2 RNA is generally detectable in upper and lower  respiratory specimens during the acute phase of infection. The lowest  concentration of SARS-CoV-2 viral copies this assay can detect is 250  copies / mL. A negative result does not preclude SARS-CoV-2 infection  and should not be used as the sole basis for treatment or other  patient management decisions.  A negative result may occur with  improper specimen collection / handling, submission of specimen other  than nasopharyngeal swab, presence of viral mutation(s) within the  areas targeted by this assay, and inadequate number of viral copies  (<250 copies / mL). A negative result must be combined with clinical  observations, patient history, and epidemiological information. If result is POSITIVE SARS-CoV-2 target nucleic acids are DETECTED. The SARS-CoV-2 RNA is generally detectable in upper and lower  respiratory specimens dur ing the acute phase of infection.  Positive  results are indicative of active infection with SARS-CoV-2.  Clinical  correlation with patient history and other diagnostic information is  necessary to determine patient infection status.  Positive results do  not rule out bacterial infection or co-infection with other viruses. If result is PRESUMPTIVE POSTIVE SARS-CoV-2 nucleic acids MAY BE PRESENT.   A presumptive positive result was obtained on the submitted specimen  and confirmed on repeat testing.  While 2019 novel coronavirus  (SARS-CoV-2) nucleic acids may be present in the submitted sample  additional confirmatory testing may be necessary for epidemiological  and / or clinical management purposes  to differentiate between  SARS-CoV-2 and other Sarbecovirus currently known to infect humans.  If clinically indicated additional testing with an alternate test  methodology [phone]) is advised. The SARS-CoV-2 RNA is  generally  detectable in upper and lower respiratory sp ecimens during the acute  phase of infection. The expected result is Negative. Fact Sheet for Patients:  StrictlyIdeas.no Fact Sheet for Healthcare Providers: BankingDealers.co.za This test is not yet approved or cleared by the [place] FDA and has been authorized for detection and/or diagnosis of SARS-CoV-2 by FDA under an Emergency Use Authorization (EUA).  This EUA will remain in effect (meaning this test can be used) for the duration of the COVID-19 declaration under Section 564(b)(1) of the Act, 21 U.S.C. section 360bbb-3(b)(1), unless the authorization is terminated or revoked sooner. Performed at [facility], [address], [place], [place] [address]   Culture, blood (routine x 2)     Status: None   Collection Time: 08/21/19 11:34 AM   Specimen: BLOOD RIGHT ARM  Result Value Ref Range Status   Specimen Description BLOOD RIGHT ARM  Final   Special Requests   Final    BOTTLES DRAWN AEROBIC AND ANAEROBIC Blood Culture results may not be optimal due to an excessive volume of blood received in culture bottles   Culture   Final    NO GROWTH 5 DAYS Performed at [facility], [place] [address], [place], [place] [address]    Report Status 08/26/2019 FINAL  Final  Culture, blood (routine x 2)     Status: None   Collection Time: 08/21/19 11:34 AM   Specimen: BLOOD LEFT HAND  Result Value Ref Range Status   Specimen Description BLOOD LEFT HAND  Final   Special Requests   Final    BOTTLES DRAWN AEROBIC AND ANAEROBIC Blood Culture adequate volume   Culture   Final    NO GROWTH 5 DAYS Performed at [facility], [place] [address], [place], [place] [address]    Report Status 08/26/2019 FINAL  Final  SARS CORONAVIRUS 2 (TAT 6-24 HRS) Nasopharyngeal Nasopharyngeal Swab     Status: None   Collection Time: 08/28/19 10:44 AM   Specimen: Nasopharyngeal Swab  Result Value Ref  Range Status   SARS Coronavirus 2 NEGATIVE NEGATIVE Final    Comment: (NOTE) SARS-CoV-2 target nucleic acids are NOT DETECTED. The SARS-CoV-2 RNA is generally detectable in upper and lower respiratory specimens during the acute phase of infection. Negative results do not preclude SARS-CoV-2 infection, do not rule out co-infections with other pathogens, and should not be used as the sole basis for treatment or other patient management decisions. Negative results must be combined with clinical observations, patient history, and epidemiological information. The expected result is Negative. Fact Sheet for Patients: SugarRoll.be Fact Sheet for Healthcare Providers: https://www.woods-mathews.com/ This test is not yet approved or cleared by the [place] FDA  and  has been authorized for detection and/or diagnosis of SARS-CoV-2 by FDA under an Emergency Use Authorization (EUA). This EUA will remain  in effect (meaning this test can be used) for the duration of the COVID-19 declaration under Section 56 4(b)(1) of the Act, 21 U.S.C. section 360bbb-3(b)(1), unless the authorization is terminated or revoked sooner. Performed at [facility], [place] [address] [place], [place] [address]      Labs: BNP (last 3 results) No results for input(s): BNP in the last 8760 hours. Basic Metabolic Panel: Recent Labs  Lab 08/23/19 0356 08/24/19 0055 08/26/19 0506 08/27/19 0311  NA 137 135  --  135  K 3.5 3.1*  --  3.6  CL 100 97*  --  97*  CO2 27 29  --  26  GLUCOSE 92 132*  --  100*  BUN 5* 10  --  9  CREATININE 0.66 0.67 0.70 0.65  CALCIUM 8.4* 8.1*  --  8.7*   Liver Function Tests: No results for input(s): AST, ALT, ALKPHOS, BILITOT, PROT, ALBUMIN in the last 168 hours. No results for input(s): LIPASE, AMYLASE in the last 168 hours. No results for input(s): AMMONIA in the last 168 hours. CBC: Recent Labs  Lab 08/25/19 0419 08/26/19 0506  08/27/19 0311 08/28/19 0348 08/29/19 0405  WBC 9.7 8.2 8.3 8.6 9.9  NEUTROABS 6.7 5.7 5.3 5.7 6.6  HGB 12.0* 11.9* 11.5* 10.9* 11.1*  HCT 36.4* 35.9* 34.3* 33.8* 34.1*  MCV 88.1 87.6 86.4 88.5 89.0  PLT 188 180 205 219 247   Cardiac Enzymes: No results for input(s): CKTOTAL, CKMB, CKMBINDEX, TROPONINI in the last 168 hours. BNP: Invalid input(s): POCBNP CBG: Recent Labs  Lab 08/22/19 1605 08/22/19 2115 08/23/19 0635 08/23/19 1128 08/23/19 1626  GLUCAP 121* 139* 92 139* 122*   D-Dimer No results for input(s): DDIMER in the last 72 hours. Hgb A1c No results for input(s): HGBA1C in the last 72 hours. Lipid Profile No results for input(s): CHOL, HDL, LDLCALC, TRIG, CHOLHDL, LDLDIRECT in the last 72 hours. Thyroid function studies No results for input(s): TSH, T4TOTAL, T3FREE, THYROIDAB in the last 72 hours.  Invalid input(s): FREET3 Anemia work up No results for input(s): VITAMINB12, FOLATE, FERRITIN, TIBC, IRON, RETICCTPCT in the last 72 hours. Urinalysis    Component Value Date/Time   COLORURINE YELLOW 08/20/2019 1305   APPEARANCEUR CLEAR 08/20/2019 1305   LABSPEC 1.014 08/20/2019 1305   PHURINE 6.0 08/20/2019 1305   GLUCOSEU NEGATIVE 08/20/2019 1305   HGBUR MODERATE (A) 08/20/2019 1305   BILIRUBINUR NEGATIVE 08/20/2019 1305   KETONESUR 20 (A) 08/20/2019 1305   PROTEINUR NEGATIVE 08/20/2019 1305   NITRITE NEGATIVE 08/20/2019 1305   LEUKOCYTESUR NEGATIVE 08/20/2019 1305   Sepsis Labs Invalid input(s): PROCALCITONIN,  WBC,  LACTICIDVEN Microbiology Recent Results (from the past 240 hour(s))  Blood Culture (routine x 2)     Status: Abnormal   Collection Time: 08/19/19  6:27 PM   Specimen: BLOOD  Result Value Ref Range Status   Specimen Description   Final    BLOOD RIGHT ANTECUBITAL Performed at [facility], [address], [place], [place] [address]    Special Requests   Final    BOTTLES DRAWN AEROBIC AND ANAEROBIC Blood Culture adequate volume Performed  at [facility], [address], [place], [place] [address]    Culture  Setup Time   Final    GRAM POSITIVE COCCI AEROBIC BOTTLE ONLY Gram Stain Report Called to,Read Back By and Verified With: MADISON  T. AT [facility] AT 1121A ON [id number] BY THOMPSON S. Organism ID to follow CRITICAL RESULT CALLED TO, READ BACK BY AND VERIFIED WITH: Tillman Sers [facility] 08/20/19 2005 JDW Performed at [facility], [place] [address] [place], [place] [address]    Culture STAPHYLOCOCCUS AUREUS (A)  Final   Report Status 08/22/2019 FINAL  Final   Organism ID, Bacteria STAPHYLOCOCCUS AUREUS  Final      Susceptibility   Staphylococcus aureus - MIC*    CIPROFLOXACIN <=0.5 SENSITIVE Sensitive     ERYTHROMYCIN <=0.25 SENSITIVE Sensitive     GENTAMICIN <=0.5 SENSITIVE Sensitive     OXACILLIN 0.5 SENSITIVE Sensitive     TETRACYCLINE <=1 SENSITIVE Sensitive     VANCOMYCIN 1 SENSITIVE Sensitive     TRIMETH/SULFA <=10 SENSITIVE Sensitive     CLINDAMYCIN <=0.25 SENSITIVE Sensitive     RIFAMPIN <=0.5 SENSITIVE Sensitive     Inducible Clindamycin NEGATIVE Sensitive     * STAPHYLOCOCCUS AUREUS  Blood Culture ID Panel (Reflexed)     Status: Abnormal   Collection Time: 08/19/19  6:27 PM  Result Value Ref Range Status   Enterococcus species NOT DETECTED NOT DETECTED Final   Listeria monocytogenes NOT DETECTED NOT DETECTED Final   Staphylococcus species DETECTED (A) NOT DETECTED Final    Comment: CRITICAL RESULT CALLED TO, READ BACK BY AND VERIFIED WITH: Tillman Sers [facility] 08/20/19 2005 JDW    Staphylococcus aureus (BCID) DETECTED (A) NOT DETECTED Final    Comment: Methicillin (oxacillin) susceptible Staphylococcus aureus (MSSA). Preferred therapy is anti staphylococcal beta lactam antibiotic (Cefazolin or Nafcillin), unless clinically contraindicated. CRITICAL RESULT CALLED TO, READ BACK BY AND VERIFIED WITH: Tillman Sers [facility] 08/20/19 2005 JDW    Methicillin resistance NOT DETECTED NOT DETECTED Final   Streptococcus species NOT  DETECTED NOT DETECTED Final   Streptococcus agalactiae NOT DETECTED NOT DETECTED Final   Streptococcus pneumoniae NOT DETECTED NOT DETECTED Final   Streptococcus pyogenes NOT DETECTED NOT DETECTED Final   Acinetobacter baumannii NOT DETECTED NOT DETECTED Final   Enterobacteriaceae species NOT DETECTED NOT DETECTED Final   Enterobacter cloacae complex NOT DETECTED NOT DETECTED Final   Escherichia coli NOT DETECTED NOT DETECTED Final   Klebsiella oxytoca NOT DETECTED NOT DETECTED Final   Klebsiella pneumoniae NOT DETECTED NOT DETECTED Final   Proteus species NOT DETECTED NOT DETECTED Final   Serratia marcescens NOT DETECTED NOT DETECTED Final   Haemophilus influenzae NOT DETECTED NOT DETECTED Final   Neisseria meningitidis NOT DETECTED NOT DETECTED Final   Pseudomonas aeruginosa NOT DETECTED NOT DETECTED Final   Candida albicans NOT DETECTED NOT DETECTED Final   Candida glabrata NOT DETECTED NOT DETECTED Final   Candida krusei NOT DETECTED NOT DETECTED Final   Candida parapsilosis NOT DETECTED NOT DETECTED Final   Candida tropicalis NOT DETECTED NOT DETECTED Final    Comment: Performed at [facility], [place]. [address], [place], [place] [address]  Blood Culture (routine x 2)     Status: None   Collection Time: 08/19/19  7:13 PM   Specimen: BLOOD  Result Value Ref Range Status   Specimen Description BLOOD LEFT ANTECUBITAL  Final   Special Requests   Final    BOTTLES DRAWN AEROBIC AND ANAEROBIC Blood Culture adequate volume   Culture   Final    NO GROWTH 5 DAYS Performed at [facility], [address], [place], [place] [address]    Report Status 08/24/2019 FINAL  Final  Urine culture     Status: None  Collection Time: 08/19/19  8:00 PM   Specimen: In/Out Cath Urine  Result Value Ref Range Status   Specimen Description   Final    IN/OUT CATH URINE Performed at [facility], [address], [place], [place] [address]    Special Requests   Final    NONE Performed at [facility], [address] [place], [place] [address]    Culture   Final    Multiple bacterial morphotypes present, none predominant. Suggest appropriate recollection if clinically indicated.   Report Status 08/21/2019 FINAL  Final  SARS Coronavirus 2 [address] [facility] order, Performed in [facility] hospital lab) Nasopharyngeal Nasopharyngeal Swab     Status: None   Collection Time: 08/19/19  8:11 PM   Specimen: Nasopharyngeal Swab  Result Value Ref Range Status   SARS Coronavirus 2 NEGATIVE NEGATIVE Final    Comment: (NOTE) If result is NEGATIVE SARS-CoV-2 target nucleic acids are NOT DETECTED. The SARS-CoV-2 RNA is generally detectable in upper and lower  respiratory specimens during the acute phase of infection. The lowest  concentration of SARS-CoV-2 viral copies this assay can detect is 250  copies / mL. A negative result does not preclude SARS-CoV-2 infection  and should not be used as the sole basis for treatment or other  patient management decisions.  A negative result may occur with  improper specimen collection / handling, submission of specimen other  than nasopharyngeal swab, presence of viral mutation(s) within the  areas targeted by this assay, and inadequate number of viral copies  (<250 copies / mL). A negative result must be combined with clinical  observations, patient history, and epidemiological information. If result is POSITIVE SARS-CoV-2 target nucleic acids are DETECTED. The SARS-CoV-2 RNA is generally detectable in upper and lower  respiratory specimens dur ing the acute phase of infection.  Positive  results are indicative of active infection with SARS-CoV-2.  Clinical  correlation with patient history and other diagnostic information is  necessary to determine patient infection status.  Positive results do  not rule out bacterial infection or co-infection with other viruses. If result is PRESUMPTIVE POSTIVE SARS-CoV-2 nucleic acids MAY BE PRESENT.   A presumptive  positive result was obtained on the submitted specimen  and confirmed on repeat testing.  While 2019 novel coronavirus  (SARS-CoV-2) nucleic acids may be present in the submitted sample  additional confirmatory testing may be necessary for epidemiological  and / or clinical management purposes  to differentiate between  SARS-CoV-2 and other Sarbecovirus currently known to infect humans.  If clinically indicated additional testing with an alternate test  methodology [phone]) is advised. The SARS-CoV-2 RNA is generally  detectable in upper and lower respiratory sp ecimens during the acute  phase of infection. The expected result is Negative. Fact Sheet for Patients:  StrictlyIdeas.no Fact Sheet for Healthcare Providers: BankingDealers.co.za This test is not yet approved or cleared by the [place] FDA and has been authorized for detection and/or diagnosis of SARS-CoV-2 by FDA under an Emergency Use Authorization (EUA).  This EUA will remain in effect (meaning this test can be used) for the duration of the COVID-19 declaration under Section 564(b)(1) of the Act, 21 U.S.C. section 360bbb-3(b)(1), unless the authorization is terminated or revoked sooner. Performed at [facility], [address] [place], [place] [address]   Culture, blood (routine x 2)     Status: None   Collection Time: 08/21/19 11:34 AM   Specimen: BLOOD RIGHT ARM  Result Value Ref Range Status  Specimen Description BLOOD RIGHT ARM  Final   Special Requests   Final    BOTTLES DRAWN AEROBIC AND ANAEROBIC Blood Culture results may not be optimal due to an excessive volume of blood received in culture bottles   Culture   Final    NO GROWTH 5 DAYS Performed at [facility], [place] [address], [place], [place] [address]    Report Status 08/26/2019 FINAL  Final  Culture, blood (routine x 2)     Status: None   Collection Time: 08/21/19 11:34 AM   Specimen: BLOOD LEFT  HAND  Result Value Ref Range Status   Specimen Description BLOOD LEFT HAND  Final   Special Requests   Final    BOTTLES DRAWN AEROBIC AND ANAEROBIC Blood Culture adequate volume   Culture   Final    NO GROWTH 5 DAYS Performed at [facility], [place] [address] [place], [place] [address]    Report Status 08/26/2019 FINAL  Final  SARS CORONAVIRUS 2 (TAT 6-24 HRS) Nasopharyngeal Nasopharyngeal Swab     Status: None   Collection Time: 08/28/19 10:44 AM   Specimen: Nasopharyngeal Swab  Result Value Ref Range Status   SARS Coronavirus 2 NEGATIVE NEGATIVE Final    Comment: (NOTE) SARS-CoV-2 target nucleic acids are NOT DETECTED. The SARS-CoV-2 RNA is generally detectable in upper and lower respiratory specimens during the acute phase of infection. Negative results do not preclude SARS-CoV-2 infection, do not rule out co-infections with other pathogens, and should not be used as the sole basis for treatment or other patient management decisions. Negative results must be combined with clinical observations, patient history, and epidemiological information. The expected result is Negative. Fact Sheet for Patients: SugarRoll.be Fact Sheet for Healthcare Providers: https://www.woods-mathews.com/ This test is not yet approved or cleared by the [place] FDA and  has been authorized for detection and/or diagnosis of SARS-CoV-2 by FDA under an Emergency Use Authorization (EUA). This EUA will remain  in effect (meaning this test can be used) for the duration of the COVID-19 declaration under Section 56 4(b)(1) of the Act, 21 U.S.C. section 360bbb-3(b)(1), unless the authorization is terminated or revoked sooner. Performed at [facility], [place] [address] [place], [place] [address]      Time coordinating discharge: 40 minutes  SIGNED:   Elmarie Shiley, MD  [facility]

## 2019-08-29 NOTE — Progress Notes (Signed)
Report called to [facility], spoke with Delene Loll, RN at [phone].  Raymond Walker to be transferred via [facility] to [facility] room 101-B.  Current health issues discussed and plan to return for surgery on 09/14/19 and current infection of MSSA.  Patient's daughter updated eariler in the day of pending discharge and information given on facility.

## 2019-08-29 NOTE — TOC Transition Note (Signed)
Transition of Care [facility]) - CM/SW Discharge Note   Patient Details  Name: Raymond Walker. MRN: [id number] Date of Birth: 23-Jun-1951  Transition of Care [facility]) CM/SW Contact:  Bary Castilla, LCSW Phone Number: [phone] 08/29/2019, 2:52 PM   Clinical Narrative:    Patient will DC to: [facility]? Anticipated DC date:?08/29/19 Family notified:Niece Catrina Transport [id number]   Per MD patient ready for DC to [facility]. RN, patient, patient's family, and facility notified of DC. Discharge Summary sent to facility. RN given number for report [phone] room 101b. DC packet on chart. Ambulance transport requested for patient.   CSW signing off.   [place], [place] [phone]    Final next level of care: Skilled Nursing Facility Barriers to Discharge: No Barriers Identified   Patient Goals and CMS Choice        Discharge Placement              Patient chooses bed at: [facility] Patient to be transferred to facility by: [facility] Name of family member notified: Catrina Patient and family notified of of transfer: 08/29/19  Discharge Plan and Services In-house Referral: Clinical Social Work                                   Social Determinants of Health (SDOH) Interventions     Readmission Risk Interventions No flowsheet data found.

## 2019-08-29 NOTE — Progress Notes (Deleted)
Vital signs were taken for Raymond Walker in his left lower arm, do to an IV in his left upper arm, and patient and family complaints that staff was taking VS over the IV site.  SBP was in the lower 80's.  Removed IV from the left upper arm and repeated the BP.  Which was 134/ SBP.  Concerned that the reading were too far off and not correct, a set of ortho static vs was obtained and is recorded in the chart.  Which obtaining the VS's, I received a call from CCMD that the patient had a drop in his EKG rhythm down to 30-40's, with a 2.9 second pause.  Page was placed to Dr. Tyrell Antonio with above info, orders received.

## 2019-08-29 NOTE — Plan of Care (Signed)

## 2019-08-29 NOTE — Progress Notes (Signed)
Vital signs taken for Raymond Walker in his left lower are, due to having an IV in the left upper arm and restriction in the right arm due to PICC.  Patient and family member were complaining that the staff had used his left upper arm since he was admitted, which has an IV in it.  SBP was in the low 80/s.  Removed IV and retook the blood pressure with a SBP of 135/.  Concerned that the reading was too far off and not correct, a set of orthostatic vs was obtained.  Page was placed to Dr.  Tyrell Antonio with above info.  Orders were received.  OK to continue with discharge.

## 2019-08-31 DIAGNOSIS — R7881 Bacteremia: Secondary | ICD-10-CM | POA: Diagnosis not present

## 2019-08-31 DIAGNOSIS — A4901 Methicillin susceptible Staphylococcus aureus infection, unspecified site: Secondary | ICD-10-CM | POA: Diagnosis not present

## 2019-08-31 DIAGNOSIS — M86171 Other acute osteomyelitis, right ankle and foot: Secondary | ICD-10-CM | POA: Diagnosis not present

## 2019-08-31 DIAGNOSIS — E43 Unspecified severe protein-calorie malnutrition: Secondary | ICD-10-CM | POA: Diagnosis not present

## 2019-08-31 DIAGNOSIS — L03115 Cellulitis of right lower limb: Secondary | ICD-10-CM | POA: Diagnosis not present

## 2019-09-01 DIAGNOSIS — I10 Essential (primary) hypertension: Secondary | ICD-10-CM | POA: Diagnosis not present

## 2019-09-01 DIAGNOSIS — I739 Peripheral vascular disease, unspecified: Secondary | ICD-10-CM | POA: Diagnosis not present

## 2019-09-01 DIAGNOSIS — I2510 Atherosclerotic heart disease of native coronary artery without angina pectoris: Secondary | ICD-10-CM | POA: Diagnosis not present

## 2019-09-01 DIAGNOSIS — R7881 Bacteremia: Secondary | ICD-10-CM | POA: Diagnosis not present

## 2019-09-07 ENCOUNTER — Other Ambulatory Visit: Payer: Self-pay | Admitting: *Deleted

## 2019-09-14 ENCOUNTER — Encounter (HOSPITAL_COMMUNITY): Payer: Self-pay | Admitting: *Deleted

## 2019-09-14 ENCOUNTER — Other Ambulatory Visit: Payer: Self-pay

## 2019-09-14 NOTE — Pre-Procedure Instructions (Signed)
   [name]  09/14/2019     Your procedure is scheduled on Tuesday, 09/15/19 at 7:30 AM.   Report to [facility] Entrance "A" Admitting Office at 5:30 AM.   Call this number if you have problems the morning of surgery: [phone]   Remember:  Mr. Rapa is not to eat or drink after midnight tonight.  Take these medicines the morning of surgery with A SIP OF WATER: Aspirin, Rosuvastatin (Crestor), Tylenol or Oxycodone - prn  Do not have pt take Plavix morning of surgery - per Dr. Servando Snare    Do not wear jewelry.  Do not wear lotions, powders, cologne or deodorant.  Men may shave face and neck.  Do not bring valuables to the hospital.  [facility] is not responsible for any belongings or valuables.  Contacts, dentures or bridgework may not be worn into surgery.  Leave your suitcase in the car.  After surgery it may be brought to your room.  For patients admitted to the hospital, discharge time will be determined by your treatment team.  If any questions, please call me, Lilia Pro, RN at [phone]

## 2019-09-14 NOTE — Progress Notes (Addendum)
Pt is a resident at [facility]. Spoke with Olivia Mackie, LPN for pre-op call. She states pt is alert, oriented and able to speak for himself. Pt has hx of HTN. Denies cardiac history or diabetes. Faxed pre-op instructions to [facility] at [phone]. Sheffield Slider, RN at Dr. Claretha Cooper office to ask about pt stopping Plavix. She states have pt not take it tomorrow.   Pt will get his Covid test done on arrival in the AM. Olivia Mackie states there are no Covid cases at the facility. She states pt has not had any Covid symptoms

## 2019-09-15 ENCOUNTER — Inpatient Hospital Stay (HOSPITAL_COMMUNITY): Payer: Medicare Other | Admitting: Certified Registered"

## 2019-09-15 ENCOUNTER — Inpatient Hospital Stay (HOSPITAL_COMMUNITY)
Admission: RE | Admit: 2019-09-15 | Discharge: 2019-09-22 | DRG: 253 | Disposition: A | Discharge status: Skilled Nursing Facility | Payer: Medicare Other | Source: Ambulatory Visit | Attending: Vascular Surgery | Admitting: Vascular Surgery

## 2019-09-15 ENCOUNTER — Encounter (HOSPITAL_COMMUNITY)
Admission: RE | Disposition: A | Discharge status: Skilled Nursing Facility | Payer: Self-pay | Source: Ambulatory Visit | Attending: Vascular Surgery

## 2019-09-15 ENCOUNTER — Encounter (HOSPITAL_COMMUNITY): Payer: Self-pay

## 2019-09-15 DIAGNOSIS — Z01812 Encounter for preprocedural laboratory examination: Secondary | ICD-10-CM | POA: Diagnosis not present

## 2019-09-15 DIAGNOSIS — Z743 Need for continuous supervision: Secondary | ICD-10-CM | POA: Diagnosis not present

## 2019-09-15 DIAGNOSIS — R52 Pain, unspecified: Secondary | ICD-10-CM | POA: Diagnosis not present

## 2019-09-15 DIAGNOSIS — Z8249 Family history of ischemic heart disease and other diseases of the circulatory system: Secondary | ICD-10-CM

## 2019-09-15 DIAGNOSIS — L97519 Non-pressure chronic ulcer of other part of right foot with unspecified severity: Secondary | ICD-10-CM | POA: Diagnosis not present

## 2019-09-15 DIAGNOSIS — S98111D Complete traumatic amputation of right great toe, subsequent encounter: Secondary | ICD-10-CM | POA: Diagnosis not present

## 2019-09-15 DIAGNOSIS — N529 Male erectile dysfunction, unspecified: Secondary | ICD-10-CM | POA: Diagnosis present

## 2019-09-15 DIAGNOSIS — Y838 Other surgical procedures as the cause of abnormal reaction of the patient, or of later complication, without mention of misadventure at the time of the procedure: Secondary | ICD-10-CM | POA: Diagnosis not present

## 2019-09-15 DIAGNOSIS — M86171 Other acute osteomyelitis, right ankle and foot: Secondary | ICD-10-CM | POA: Diagnosis not present

## 2019-09-15 DIAGNOSIS — R7881 Bacteremia: Secondary | ICD-10-CM | POA: Diagnosis not present

## 2019-09-15 DIAGNOSIS — I739 Peripheral vascular disease, unspecified: Secondary | ICD-10-CM | POA: Diagnosis present

## 2019-09-15 DIAGNOSIS — Z87891 Personal history of nicotine dependence: Secondary | ICD-10-CM

## 2019-09-15 DIAGNOSIS — Z79899 Other long term (current) drug therapy: Secondary | ICD-10-CM

## 2019-09-15 DIAGNOSIS — Z9841 Cataract extraction status, right eye: Secondary | ICD-10-CM | POA: Diagnosis not present

## 2019-09-15 DIAGNOSIS — Z95820 Peripheral vascular angioplasty status with implants and grafts: Secondary | ICD-10-CM | POA: Diagnosis not present

## 2019-09-15 DIAGNOSIS — A4901 Methicillin susceptible Staphylococcus aureus infection, unspecified site: Secondary | ICD-10-CM | POA: Diagnosis not present

## 2019-09-15 DIAGNOSIS — Z9852 Vasectomy status: Secondary | ICD-10-CM

## 2019-09-15 DIAGNOSIS — R319 Hematuria, unspecified: Secondary | ICD-10-CM | POA: Diagnosis not present

## 2019-09-15 DIAGNOSIS — R7302 Impaired glucose tolerance (oral): Secondary | ICD-10-CM | POA: Diagnosis not present

## 2019-09-15 DIAGNOSIS — I97638 Postprocedural hematoma of a circulatory system organ or structure following other circulatory system procedure: Secondary | ICD-10-CM | POA: Diagnosis not present

## 2019-09-15 DIAGNOSIS — F17211 Nicotine dependence, cigarettes, in remission: Secondary | ICD-10-CM | POA: Diagnosis not present

## 2019-09-15 DIAGNOSIS — Y92234 Operating room of hospital as the place of occurrence of the external cause: Secondary | ICD-10-CM | POA: Diagnosis not present

## 2019-09-15 DIAGNOSIS — L97509 Non-pressure chronic ulcer of other part of unspecified foot with unspecified severity: Secondary | ICD-10-CM | POA: Diagnosis not present

## 2019-09-15 DIAGNOSIS — I97618 Postprocedural hemorrhage and hematoma of a circulatory system organ or structure following other circulatory system procedure: Secondary | ICD-10-CM | POA: Diagnosis not present

## 2019-09-15 DIAGNOSIS — Z89429 Acquired absence of other toe(s), unspecified side: Secondary | ICD-10-CM | POA: Diagnosis not present

## 2019-09-15 DIAGNOSIS — E43 Unspecified severe protein-calorie malnutrition: Secondary | ICD-10-CM | POA: Diagnosis not present

## 2019-09-15 DIAGNOSIS — Z8631 Personal history of diabetic foot ulcer: Secondary | ICD-10-CM | POA: Diagnosis not present

## 2019-09-15 DIAGNOSIS — Z20828 Contact with and (suspected) exposure to other viral communicable diseases: Secondary | ICD-10-CM | POA: Diagnosis present

## 2019-09-15 DIAGNOSIS — Z951 Presence of aortocoronary bypass graft: Secondary | ICD-10-CM | POA: Diagnosis not present

## 2019-09-15 DIAGNOSIS — E11621 Type 2 diabetes mellitus with foot ulcer: Secondary | ICD-10-CM | POA: Diagnosis not present

## 2019-09-15 DIAGNOSIS — Z961 Presence of intraocular lens: Secondary | ICD-10-CM | POA: Diagnosis present

## 2019-09-15 DIAGNOSIS — I998 Other disorder of circulatory system: Secondary | ICD-10-CM | POA: Diagnosis not present

## 2019-09-15 DIAGNOSIS — R2689 Other abnormalities of gait and mobility: Secondary | ICD-10-CM | POA: Diagnosis not present

## 2019-09-15 DIAGNOSIS — I96 Gangrene, not elsewhere classified: Secondary | ICD-10-CM | POA: Diagnosis not present

## 2019-09-15 DIAGNOSIS — N390 Urinary tract infection, site not specified: Secondary | ICD-10-CM | POA: Diagnosis not present

## 2019-09-15 DIAGNOSIS — J449 Chronic obstructive pulmonary disease, unspecified: Secondary | ICD-10-CM | POA: Diagnosis not present

## 2019-09-15 DIAGNOSIS — I70261 Atherosclerosis of native arteries of extremities with gangrene, right leg: Secondary | ICD-10-CM | POA: Diagnosis not present

## 2019-09-15 DIAGNOSIS — M6281 Muscle weakness (generalized): Secondary | ICD-10-CM | POA: Diagnosis not present

## 2019-09-15 DIAGNOSIS — Z9842 Cataract extraction status, left eye: Secondary | ICD-10-CM | POA: Diagnosis not present

## 2019-09-15 DIAGNOSIS — E1169 Type 2 diabetes mellitus with other specified complication: Secondary | ICD-10-CM | POA: Diagnosis not present

## 2019-09-15 DIAGNOSIS — I959 Hypotension, unspecified: Secondary | ICD-10-CM | POA: Diagnosis not present

## 2019-09-15 DIAGNOSIS — R279 Unspecified lack of coordination: Secondary | ICD-10-CM | POA: Diagnosis not present

## 2019-09-15 DIAGNOSIS — Z720 Tobacco use: Secondary | ICD-10-CM | POA: Diagnosis not present

## 2019-09-15 DIAGNOSIS — I6529 Occlusion and stenosis of unspecified carotid artery: Secondary | ICD-10-CM | POA: Diagnosis not present

## 2019-09-15 DIAGNOSIS — E785 Hyperlipidemia, unspecified: Secondary | ICD-10-CM | POA: Diagnosis not present

## 2019-09-15 DIAGNOSIS — L03115 Cellulitis of right lower limb: Secondary | ICD-10-CM | POA: Diagnosis not present

## 2019-09-15 DIAGNOSIS — I10 Essential (primary) hypertension: Secondary | ICD-10-CM | POA: Diagnosis not present

## 2019-09-15 DIAGNOSIS — S98211D Complete traumatic amputation of two or more right lesser toes, subsequent encounter: Secondary | ICD-10-CM | POA: Diagnosis not present

## 2019-09-15 DIAGNOSIS — I2510 Atherosclerotic heart disease of native coronary artery without angina pectoris: Secondary | ICD-10-CM | POA: Diagnosis not present

## 2019-09-15 DIAGNOSIS — N200 Calculus of kidney: Secondary | ICD-10-CM | POA: Diagnosis not present

## 2019-09-15 HISTORY — DX: Personal history of urinary calculi: Z87.442

## 2019-09-15 HISTORY — PX: AMPUTATION: SHX166

## 2019-09-15 HISTORY — PX: FEMORAL-POPLITEAL BYPASS GRAFT: SHX937

## 2019-09-15 HISTORY — DX: Essential (primary) hypertension: I10

## 2019-09-15 LAB — CBC
HCT: 38.4 % — ABNORMAL LOW (ref 39.0–52.0)
HCT: 20.8 % — ABNORMAL LOW (ref 39.0–52.0)
HCT: 19 % — ABNORMAL LOW (ref 39.0–52.0)
Hemoglobin: 12.5 g/dL — ABNORMAL LOW (ref 13.0–17.0)
Hemoglobin: 6.7 g/dL — CL (ref 13.0–17.0)
Hemoglobin: 6.2 g/dL — CL (ref 13.0–17.0)
MCH: 29 pg (ref 26.0–34.0)
MCH: 29.4 pg (ref 26.0–34.0)
MCH: 29.7 pg (ref 26.0–34.0)
MCHC: 32.6 g/dL (ref 30.0–36.0)
MCHC: 32.2 g/dL (ref 30.0–36.0)
MCHC: 32.6 g/dL (ref 30.0–36.0)
MCV: 89.1 fL (ref 80.0–100.0)
MCV: 91.2 fL (ref 80.0–100.0)
MCV: 90.9 fL (ref 80.0–100.0)
Platelets: 174 10*3/uL (ref 150–400)
Platelets: 117 10*3/uL — ABNORMAL LOW (ref 150–400)
Platelets: 124 10*3/uL — ABNORMAL LOW (ref 150–400)
RBC: 4.31 MIL/uL (ref 4.22–5.81)
RBC: 2.28 MIL/uL — ABNORMAL LOW (ref 4.22–5.81)
RBC: 2.09 MIL/uL — ABNORMAL LOW (ref 4.22–5.81)
RDW: 14.6 % (ref 11.5–15.5)
RDW: 14.6 % (ref 11.5–15.5)
RDW: 14.6 % (ref 11.5–15.5)
WBC: 7.9 10*3/uL (ref 4.0–10.5)
WBC: 11.1 10*3/uL — ABNORMAL HIGH (ref 4.0–10.5)
WBC: 9.9 10*3/uL (ref 4.0–10.5)
nRBC: 0 % (ref 0.0–0.2)
nRBC: 0 % (ref 0.0–0.2)
nRBC: 0 % (ref 0.0–0.2)

## 2019-09-15 LAB — BASIC METABOLIC PANEL
Anion gap: 8 (ref 5–15)
BUN: 18 mg/dL (ref 8–23)
CO2: 21 mmol/L — ABNORMAL LOW (ref 22–32)
Calcium: 7.9 mg/dL — ABNORMAL LOW (ref 8.9–10.3)
Chloride: 112 mmol/L — ABNORMAL HIGH (ref 98–111)
Creatinine, Ser: 0.96 mg/dL (ref 0.61–1.24)
GFR calc Af Amer: >60 mL/min (ref >60)
GFR calc non Af Amer: >60 mL/min (ref >60)
Glucose, Bld: 167 mg/dL — ABNORMAL HIGH (ref 70–99)
Potassium: 4.4 mmol/L (ref 3.5–5.1)
Sodium: 141 mmol/L (ref 135–145)

## 2019-09-15 LAB — URINALYSIS, ROUTINE W REFLEX MICROSCOPIC
Bilirubin Urine: NEGATIVE
Glucose, UA: NEGATIVE mg/dL
Ketones, ur: NEGATIVE mg/dL
Nitrite: NEGATIVE
Protein, ur: NEGATIVE mg/dL
RBC / HPF: >50 RBC/hpf — ABNORMAL HIGH (ref 0–5)
Specific Gravity, Urine: 1.018 (ref 1.005–1.030)
pH: 5 (ref 5.0–8.0)

## 2019-09-15 LAB — SARS CORONAVIRUS 2 BY RT PCR (HOSPITAL ORDER, PERFORMED IN ~~LOC~~ HOSPITAL LAB): SARS Coronavirus 2: NEGATIVE

## 2019-09-15 LAB — COMPREHENSIVE METABOLIC PANEL
ALT: 13 U/L (ref 0–44)
AST: 18 U/L (ref 15–41)
Albumin: 3.2 g/dL — ABNORMAL LOW (ref 3.5–5.0)
Alkaline Phosphatase: 86 U/L (ref 38–126)
Anion gap: 9 (ref 5–15)
BUN: 15 mg/dL (ref 8–23)
CO2: 26 mmol/L (ref 22–32)
Calcium: 9.3 mg/dL (ref 8.9–10.3)
Chloride: 103 mmol/L (ref 98–111)
Creatinine, Ser: 0.76 mg/dL (ref 0.61–1.24)
GFR calc Af Amer: >60 mL/min (ref >60)
GFR calc non Af Amer: >60 mL/min (ref >60)
Glucose, Bld: 116 mg/dL — ABNORMAL HIGH (ref 70–99)
Potassium: 3.7 mmol/L (ref 3.5–5.1)
Sodium: 138 mmol/L (ref 135–145)
Total Bilirubin: 0.5 mg/dL (ref 0.3–1.2)
Total Protein: 7.6 g/dL (ref 6.5–8.1)

## 2019-09-15 LAB — PREPARE RBC (CROSSMATCH)

## 2019-09-15 LAB — PROTIME-INR
INR: 1.2 (ref 0.8–1.2)
Prothrombin Time: 14.9 seconds (ref 11.4–15.2)

## 2019-09-15 LAB — APTT: aPTT: 29 seconds (ref 24–36)

## 2019-09-15 LAB — CREATININE, SERUM
Creatinine, Ser: 0.85 mg/dL (ref 0.61–1.24)
GFR calc Af Amer: >60 mL/min (ref >60)
GFR calc non Af Amer: >60 mL/min (ref >60)

## 2019-09-15 LAB — ABO/RH: ABO/RH(D): A POS

## 2019-09-15 LAB — TROPONIN I (HIGH SENSITIVITY): Troponin I (High Sensitivity): 3 ng/L (ref <18)

## 2019-09-15 SURGERY — BYPASS GRAFT FEMORAL-POPLITEAL ARTERY
Anesthesia: General | Laterality: Right

## 2019-09-15 MED ORDER — OXYCODONE HCL 5 MG PO TABS: 5.0000 mg | ORAL_TABLET | Freq: Once | ORAL | Status: DC | PRN

## 2019-09-15 MED ORDER — SODIUM CHLORIDE 0.9 % IV SOLN: INTRAVENOUS | Status: DC

## 2019-09-15 MED ORDER — CLOPIDOGREL BISULFATE 75 MG PO TABS
75.0000 mg | ORAL_TABLET | Freq: Every day | ORAL | Status: DC
  Administered 2019-09-16 – 2019-09-22 (×7): 75 mg via ORAL
  Filled 2019-09-15 (×7): qty 1

## 2019-09-15 MED ORDER — SODIUM CHLORIDE 0.9 % IV SOLN
INTRAVENOUS | Status: DC
  Administered 2019-09-15: 125 mL/h via INTRAVENOUS
  Administered 2019-09-15: 23:00:00 via INTRAVENOUS

## 2019-09-15 MED ORDER — JUVEN PO PACK
1.0000 | PACK | Freq: Two times a day (BID) | ORAL | Status: DC
  Administered 2019-09-16 – 2019-09-21 (×3): 1 via ORAL
  Filled 2019-09-15 (×14): qty 1

## 2019-09-15 MED ORDER — ROCURONIUM BROMIDE 10 MG/ML (PF) SYRINGE
PREFILLED_SYRINGE | INTRAVENOUS | Status: DC | PRN
  Administered 2019-09-15: 50 mg via INTRAVENOUS

## 2019-09-15 MED ORDER — NEOSTIGMINE METHYLSULFATE 10 MG/10ML IV SOLN
INTRAVENOUS | Status: DC | PRN
  Administered 2019-09-15: 3 mg via INTRAVENOUS

## 2019-09-15 MED ORDER — SODIUM CHLORIDE 0.9 % IV SOLN
INTRAVENOUS | Status: AC
  Filled 2019-09-15: qty 1.2

## 2019-09-15 MED ORDER — HYDRALAZINE HCL 20 MG/ML IJ SOLN: 5.0000 mg | INTRAMUSCULAR | Status: DC | PRN

## 2019-09-15 MED ORDER — CEFAZOLIN SODIUM-DEXTROSE 2-4 GM/100ML-% IV SOLN
2.0000 g | Freq: Three times a day (TID) | INTRAVENOUS | Status: AC
  Administered 2019-09-15 – 2019-09-16 (×2): 2 g via INTRAVENOUS
  Filled 2019-09-15 (×2): qty 100

## 2019-09-15 MED ORDER — DOCUSATE SODIUM 100 MG PO CAPS
100.0000 mg | ORAL_CAPSULE | Freq: Every day | ORAL | Status: DC
  Administered 2019-09-21 – 2019-09-22 (×2): 100 mg via ORAL
  Filled 2019-09-15 (×7): qty 1

## 2019-09-15 MED ORDER — SODIUM CHLORIDE 0.9 % IV SOLN
Freq: Once | INTRAVENOUS | Status: AC
  Administered 2019-09-15: 13:00:00 via INTRAVENOUS

## 2019-09-15 MED ORDER — MAGNESIUM SULFATE 2 GM/50ML IV SOLN: 2.0000 g | Freq: Every day | INTRAVENOUS | Status: DC | PRN

## 2019-09-15 MED ORDER — GLYCOPYRROLATE 0.2 MG/ML IJ SOLN
INTRAMUSCULAR | Status: DC | PRN
  Administered 2019-09-15: 0.4 mg via INTRAVENOUS

## 2019-09-15 MED ORDER — ACETAMINOPHEN 10 MG/ML IV SOLN: 1000.0000 mg | Freq: Once | INTRAVENOUS | Status: DC | PRN

## 2019-09-15 MED ORDER — SODIUM CHLORIDE 0.9% IV SOLUTION: Freq: Once | INTRAVENOUS | Status: DC

## 2019-09-15 MED ORDER — CEFAZOLIN SODIUM-DEXTROSE 2-4 GM/100ML-% IV SOLN
2.0000 g | INTRAVENOUS | Status: AC
  Administered 2019-09-15 (×2): 2 g via INTRAVENOUS
  Filled 2019-09-15: qty 100

## 2019-09-15 MED ORDER — BISACODYL 5 MG PO TBEC: 5.0000 mg | DELAYED_RELEASE_TABLET | Freq: Every day | ORAL | Status: DC | PRN

## 2019-09-15 MED ORDER — OXYCODONE HCL 5 MG/5ML PO SOLN: 5.0000 mg | Freq: Once | ORAL | Status: DC | PRN

## 2019-09-15 MED ORDER — OXYCODONE-ACETAMINOPHEN 5-325 MG PO TABS
1.0000 | ORAL_TABLET | ORAL | Status: DC | PRN
  Administered 2019-09-15 – 2019-09-22 (×9): 2 via ORAL
  Filled 2019-09-15 (×10): qty 2

## 2019-09-15 MED ORDER — ACETAMINOPHEN 160 MG/5ML PO SOLN: 1000.0000 mg | Freq: Once | ORAL | Status: DC | PRN

## 2019-09-15 MED ORDER — LACTATED RINGERS IV SOLN
INTRAVENOUS | Status: DC | PRN
  Administered 2019-09-15: 08:00:00 via INTRAVENOUS

## 2019-09-15 MED ORDER — CLOPIDOGREL BISULFATE 75 MG PO TABS: 75.0000 mg | ORAL_TABLET | Freq: Every day | ORAL | Status: DC

## 2019-09-15 MED ORDER — POTASSIUM CHLORIDE CRYS ER 20 MEQ PO TBCR: 20.0000 meq | EXTENDED_RELEASE_TABLET | Freq: Every day | ORAL | Status: DC | PRN

## 2019-09-15 MED ORDER — ONDANSETRON HCL 4 MG/2ML IJ SOLN
INTRAMUSCULAR | Status: DC | PRN
  Administered 2019-09-15: 4 mg via INTRAVENOUS

## 2019-09-15 MED ORDER — METOPROLOL TARTRATE 5 MG/5ML IV SOLN: 2.0000 mg | INTRAVENOUS | Status: DC | PRN

## 2019-09-15 MED ORDER — LIDOCAINE 2% (20 MG/ML) 5 ML SYRINGE
INTRAMUSCULAR | Status: DC | PRN
  Administered 2019-09-15: 60 mg via INTRAVENOUS

## 2019-09-15 MED ORDER — PHENOL 1.4 % MT LIQD: 1.0000 | OROMUCOSAL | Status: DC | PRN

## 2019-09-15 MED ORDER — CHLORHEXIDINE GLUCONATE CLOTH 2 % EX PADS: 6.0000 | MEDICATED_PAD | Freq: Once | CUTANEOUS | Status: DC

## 2019-09-15 MED ORDER — OXYCODONE HCL 5 MG PO TABS: 5.0000 mg | ORAL_TABLET | ORAL | Status: DC | PRN

## 2019-09-15 MED ORDER — PROTAMINE SULFATE 10 MG/ML IV SOLN
INTRAVENOUS | Status: DC | PRN
  Administered 2019-09-15: 50 mg via INTRAVENOUS

## 2019-09-15 MED ORDER — ROSUVASTATIN CALCIUM 20 MG PO TABS
20.0000 mg | ORAL_TABLET | Freq: Every day | ORAL | Status: DC
  Administered 2019-09-16 – 2019-09-22 (×7): 20 mg via ORAL
  Filled 2019-09-15 (×7): qty 1

## 2019-09-15 MED ORDER — FENTANYL CITRATE (PF) 100 MCG/2ML IJ SOLN
INTRAMUSCULAR | Status: DC | PRN
  Administered 2019-09-15 (×6): 50 ug via INTRAVENOUS
  Administered 2019-09-15 (×2): 25 ug via INTRAVENOUS

## 2019-09-15 MED ORDER — HEMOSTATIC AGENTS (NO CHARGE) OPTIME
TOPICAL | Status: DC | PRN
  Administered 2019-09-15: 1 via TOPICAL

## 2019-09-15 MED ORDER — ASPIRIN EC 81 MG PO TBEC: 81.0000 mg | DELAYED_RELEASE_TABLET | Freq: Every day | ORAL | Status: DC

## 2019-09-15 MED ORDER — SODIUM CHLORIDE 0.9 % IV SOLN
500.0000 mL | Freq: Once | INTRAVENOUS | Status: AC | PRN
  Administered 2019-09-15: 500 mL via INTRAVENOUS

## 2019-09-15 MED ORDER — ROSUVASTATIN CALCIUM 20 MG PO TABS: 20.0000 mg | ORAL_TABLET | Freq: Every day | ORAL | Status: DC

## 2019-09-15 MED ORDER — SENNOSIDES-DOCUSATE SODIUM 8.6-50 MG PO TABS: 1.0000 | ORAL_TABLET | Freq: Every evening | ORAL | Status: DC | PRN

## 2019-09-15 MED ORDER — SENNA 8.6 MG PO TABS
1.0000 | ORAL_TABLET | Freq: Every day | ORAL | Status: DC
  Filled 2019-09-15: qty 1

## 2019-09-15 MED ORDER — ENSURE ENLIVE PO LIQD
237.0000 mL | Freq: Two times a day (BID) | ORAL | Status: DC
  Administered 2019-09-16 – 2019-09-22 (×6): 237 mL via ORAL

## 2019-09-15 MED ORDER — DEXAMETHASONE SODIUM PHOSPHATE 10 MG/ML IJ SOLN
INTRAMUSCULAR | Status: DC | PRN
  Administered 2019-09-15: 8 mg via INTRAVENOUS

## 2019-09-15 MED ORDER — PROPOFOL 10 MG/ML IV BOLUS
INTRAVENOUS | Status: AC
  Filled 2019-09-15: qty 40

## 2019-09-15 MED ORDER — ACETAMINOPHEN 500 MG PO TABS: 1000.0000 mg | ORAL_TABLET | Freq: Once | ORAL | Status: DC | PRN

## 2019-09-15 MED ORDER — HYDROMORPHONE HCL 1 MG/ML IJ SOLN
0.5000 mg | INTRAMUSCULAR | Status: DC | PRN
  Administered 2019-09-16: 1 mg via INTRAVENOUS
  Filled 2019-09-15: qty 1

## 2019-09-15 MED ORDER — LACTATED RINGERS IV SOLN
INTRAVENOUS | Status: DC | PRN
  Administered 2019-09-15 (×2): via INTRAVENOUS

## 2019-09-15 MED ORDER — PHENYLEPHRINE 40 MCG/ML (10ML) SYRINGE FOR IV PUSH (FOR BLOOD PRESSURE SUPPORT)
PREFILLED_SYRINGE | INTRAVENOUS | Status: DC | PRN
  Administered 2019-09-15: 80 ug via INTRAVENOUS
  Administered 2019-09-15: 160 ug via INTRAVENOUS
  Administered 2019-09-15: 40 ug via INTRAVENOUS
  Administered 2019-09-15: 80 ug via INTRAVENOUS

## 2019-09-15 MED ORDER — HEPARIN SODIUM (PORCINE) 1000 UNIT/ML IJ SOLN
INTRAMUSCULAR | Status: DC | PRN
  Administered 2019-09-15: 7000 [IU] via INTRAVENOUS
  Administered 2019-09-15: 3000 [IU] via INTRAVENOUS

## 2019-09-15 MED ORDER — ADULT MULTIVITAMIN W/MINERALS CH
1.0000 | ORAL_TABLET | Freq: Every day | ORAL | Status: DC
  Administered 2019-09-16 – 2019-09-22 (×7): 1 via ORAL
  Filled 2019-09-15 (×7): qty 1

## 2019-09-15 MED ORDER — FENTANYL CITRATE (PF) 250 MCG/5ML IJ SOLN
INTRAMUSCULAR | Status: AC
  Filled 2019-09-15: qty 5

## 2019-09-15 MED ORDER — LABETALOL HCL 5 MG/ML IV SOLN: 10.0000 mg | INTRAVENOUS | Status: DC | PRN

## 2019-09-15 MED ORDER — 0.9 % SODIUM CHLORIDE (POUR BTL) OPTIME
TOPICAL | Status: DC | PRN
  Administered 2019-09-15: 2000 mL

## 2019-09-15 MED ORDER — ONDANSETRON HCL 4 MG/2ML IJ SOLN: 4.0000 mg | Freq: Four times a day (QID) | INTRAMUSCULAR | Status: DC | PRN

## 2019-09-15 MED ORDER — PANTOPRAZOLE SODIUM 40 MG PO TBEC
40.0000 mg | DELAYED_RELEASE_TABLET | Freq: Every day | ORAL | Status: DC
  Administered 2019-09-16 – 2019-09-22 (×7): 40 mg via ORAL
  Filled 2019-09-15 (×7): qty 1

## 2019-09-15 MED ORDER — FENTANYL CITRATE (PF) 100 MCG/2ML IJ SOLN: 25.0000 ug | INTRAMUSCULAR | Status: DC | PRN

## 2019-09-15 MED ORDER — CEFAZOLIN IV (FOR PTA / DISCHARGE USE ONLY): 2.0000 g | Freq: Three times a day (TID) | INTRAVENOUS | Status: DC

## 2019-09-15 MED ORDER — ALUM & MAG HYDROXIDE-SIMETH 200-200-20 MG/5ML PO SUSP: 15.0000 mL | ORAL | Status: DC | PRN

## 2019-09-15 MED ORDER — HEPARIN SODIUM (PORCINE) 5000 UNIT/ML IJ SOLN
5000.0000 [IU] | Freq: Three times a day (TID) | INTRAMUSCULAR | Status: DC
  Administered 2019-09-16 (×2): 5000 [IU] via SUBCUTANEOUS
  Filled 2019-09-15 (×6): qty 1

## 2019-09-15 MED ORDER — CHLORHEXIDINE GLUCONATE CLOTH 2 % EX PADS
6.0000 | MEDICATED_PAD | Freq: Every day | CUTANEOUS | Status: DC
  Administered 2019-09-15 – 2019-09-22 (×3): 6 via TOPICAL

## 2019-09-15 MED ORDER — ASPIRIN EC 81 MG PO TBEC
81.0000 mg | DELAYED_RELEASE_TABLET | Freq: Every day | ORAL | Status: DC
  Administered 2019-09-16 – 2019-09-22 (×7): 81 mg via ORAL
  Filled 2019-09-15 (×8): qty 1

## 2019-09-15 MED ORDER — MIDAZOLAM HCL 5 MG/5ML IJ SOLN
INTRAMUSCULAR | Status: DC | PRN
  Administered 2019-09-15: 1 mg via INTRAVENOUS

## 2019-09-15 MED ORDER — SODIUM CHLORIDE 0.9 % IV SOLN
INTRAVENOUS | Status: DC | PRN
  Administered 2019-09-15: 500 mL

## 2019-09-15 MED ORDER — POLYETHYLENE GLYCOL 3350 17 G PO PACK
17.0000 g | PACK | Freq: Every day | ORAL | Status: DC
  Filled 2019-09-15 (×4): qty 1

## 2019-09-15 MED ORDER — GUAIFENESIN-DM 100-10 MG/5ML PO SYRP: 15.0000 mL | ORAL_SOLUTION | ORAL | Status: DC | PRN

## 2019-09-15 MED ORDER — ACETAMINOPHEN 500 MG PO TABS
500.0000 mg | ORAL_TABLET | Freq: Three times a day (TID) | ORAL | Status: DC | PRN
  Administered 2019-09-17: 500 mg via ORAL
  Filled 2019-09-15: qty 1

## 2019-09-15 MED ORDER — SODIUM CHLORIDE 0.9 % IV SOLN
INTRAVENOUS | Status: DC | PRN
  Administered 2019-09-15: 08:00:00 15 ug/min via INTRAVENOUS

## 2019-09-15 MED ORDER — PROPOFOL 10 MG/ML IV BOLUS
INTRAVENOUS | Status: DC | PRN
  Administered 2019-09-15: 110 mg via INTRAVENOUS
  Administered 2019-09-15: 40 mg via INTRAVENOUS
  Administered 2019-09-15: 50 mg via INTRAVENOUS

## 2019-09-15 MED ORDER — ALBUMIN HUMAN 5 % IV SOLN
INTRAVENOUS | Status: DC | PRN
  Administered 2019-09-15 (×3): via INTRAVENOUS

## 2019-09-15 MED ORDER — SODIUM CHLORIDE 0.9 % IV SOLN
Freq: Once | INTRAVENOUS | Status: AC
  Administered 2019-09-15: 15:00:00 via INTRAVENOUS

## 2019-09-15 MED ORDER — NICOTINE 21 MG/24HR TD PT24
21.0000 mg | MEDICATED_PATCH | Freq: Every day | TRANSDERMAL | Status: DC | PRN
  Administered 2019-09-17 – 2019-09-18 (×2): 21 mg via TRANSDERMAL
  Filled 2019-09-15 (×2): qty 1

## 2019-09-15 MED ORDER — SUCCINYLCHOLINE CHLORIDE 200 MG/10ML IV SOSY
PREFILLED_SYRINGE | INTRAVENOUS | Status: DC | PRN
  Administered 2019-09-15: 80 mg via INTRAVENOUS

## 2019-09-15 MED ORDER — MIDAZOLAM HCL 2 MG/2ML IJ SOLN
INTRAMUSCULAR | Status: AC
  Filled 2019-09-15: qty 2

## 2019-09-15 SURGICAL SUPPLY — 74 items
BAG ISOLATION DRAPE 18X18 (DRAPES) ×1 IMPLANT
BANDAGE ESMARK 6X9 LF (GAUZE/BANDAGES/DRESSINGS) ×1 IMPLANT
BLADE AVERAGE 25X9 (BLADE) ×2 IMPLANT
BLADE SAW SGTL 81X20 HD (BLADE) IMPLANT
BNDG ELASTIC 4X5.8 VLCR STR LF (GAUZE/BANDAGES/DRESSINGS) ×2 IMPLANT
BNDG ESMARK 6X9 LF (GAUZE/BANDAGES/DRESSINGS) ×2
BNDG GAUZE ELAST 4 BULKY (GAUZE/BANDAGES/DRESSINGS) ×2 IMPLANT
CANISTER SUCT 3000ML PPV (MISCELLANEOUS) ×2 IMPLANT
CANNULA VESSEL 3MM 2 BLNT TIP (CANNULA) IMPLANT
CLIP VESOCCLUDE MED 24/CT (CLIP) ×2 IMPLANT
CLIP VESOCCLUDE SM WIDE 24/CT (CLIP) ×2 IMPLANT
COVER SURGICAL LIGHT HANDLE (MISCELLANEOUS) ×2 IMPLANT
COVER WAND RF STERILE (DRAPES) ×2 IMPLANT
CUFF TOURN SGL QUICK 24 (TOURNIQUET CUFF) ×1
CUFF TOURN SGL QUICK 34 (TOURNIQUET CUFF)
CUFF TOURN SGL QUICK 42 (TOURNIQUET CUFF) IMPLANT
CUFF TRNQT CYL 24X4X16.5-23 (TOURNIQUET CUFF) ×1 IMPLANT
CUFF TRNQT CYL 34X4.125X (TOURNIQUET CUFF) IMPLANT
DERMABOND ADVANCED (GAUZE/BANDAGES/DRESSINGS) ×1
DERMABOND ADVANCED .7 DNX12 (GAUZE/BANDAGES/DRESSINGS) ×1 IMPLANT
DRAIN CHANNEL 15F RND FF W/TCR (WOUND CARE) IMPLANT
DRAPE C-ARM 42X72 X-RAY (DRAPES) IMPLANT
DRAPE EXTREMITY T 121X128X90 (DISPOSABLE) ×2 IMPLANT
DRAPE HALF SHEET 40X57 (DRAPES) ×2 IMPLANT
DRAPE ISOLATION BAG 18X18 (DRAPES) ×1
DRAPE X-RAY CASS 24X20 (DRAPES) IMPLANT
DRSG ADAPTIC 3X8 NADH LF (GAUZE/BANDAGES/DRESSINGS) ×2 IMPLANT
ELECT REM PT RETURN 9FT ADLT (ELECTROSURGICAL) ×2
ELECTRODE REM PT RTRN 9FT ADLT (ELECTROSURGICAL) ×1 IMPLANT
EVACUATOR SILICONE 100CC (DRAIN) IMPLANT
GAUZE SPONGE 4X4 12PLY STRL (GAUZE/BANDAGES/DRESSINGS) ×2 IMPLANT
GAUZE SPONGE 4X4 12PLY STRL LF (GAUZE/BANDAGES/DRESSINGS) ×2 IMPLANT
GLOVE BIO SURGEON STRL SZ7.5 (GLOVE) ×2 IMPLANT
GLOVE BIOGEL PI IND STRL 6.5 (GLOVE) ×3 IMPLANT
GLOVE BIOGEL PI IND STRL 7.0 (GLOVE) ×1 IMPLANT
GLOVE BIOGEL PI INDICATOR 6.5 (GLOVE) ×3
GLOVE BIOGEL PI INDICATOR 7.0 (GLOVE) ×1
GLOVE SURG SS PI 6.5 STRL IVOR (GLOVE) ×6 IMPLANT
GOWN STRL REUS W/ TWL LRG LVL3 (GOWN DISPOSABLE) ×2 IMPLANT
GOWN STRL REUS W/ TWL XL LVL3 (GOWN DISPOSABLE) ×1 IMPLANT
GOWN STRL REUS W/TWL LRG LVL3 (GOWN DISPOSABLE) ×2
GOWN STRL REUS W/TWL XL LVL3 (GOWN DISPOSABLE) ×1
GRAFT PROPATEN W/RING 6X80X60 (Vascular Products) ×2 IMPLANT
HEMOSTAT SNOW SURGICEL 2X4 (HEMOSTASIS) ×4 IMPLANT
INSERT FOGARTY SM (MISCELLANEOUS) IMPLANT
KIT BASIN OR (CUSTOM PROCEDURE TRAY) ×2 IMPLANT
KIT TURNOVER KIT B (KITS) ×2 IMPLANT
MARKER GRAFT CORONARY BYPASS (MISCELLANEOUS) IMPLANT
NEEDLE HYPO 25GX1X1/2 BEV (NEEDLE) IMPLANT
NS IRRIG 1000ML POUR BTL (IV SOLUTION) ×4 IMPLANT
PACK GENERAL/GYN (CUSTOM PROCEDURE TRAY) ×2 IMPLANT
PACK PERIPHERAL VASCULAR (CUSTOM PROCEDURE TRAY) ×2 IMPLANT
PAD ARMBOARD 7.5X6 YLW CONV (MISCELLANEOUS) ×4 IMPLANT
SET COLLECT BLD 21X3/4 12 (NEEDLE) IMPLANT
SPONGE LAP 18X18 RF (DISPOSABLE) ×8 IMPLANT
STOPCOCK 4 WAY LG BORE MALE ST (IV SETS) IMPLANT
SUT ETHILON 3 0 PS 1 (SUTURE) ×2 IMPLANT
SUT MNCRL AB 4-0 PS2 18 (SUTURE) ×8 IMPLANT
SUT PROLENE 5 0 C 1 24 (SUTURE) ×6 IMPLANT
SUT PROLENE 6 0 BV (SUTURE) ×18 IMPLANT
SUT PROLENE 6 0 C 1 24 (SUTURE) ×2 IMPLANT
SUT PROLENE 7 0 BV 1 (SUTURE) IMPLANT
SUT SILK 2 0 SH (SUTURE) ×2 IMPLANT
SUT SILK 3 0 (SUTURE)
SUT SILK 3-0 18XBRD TIE 12 (SUTURE) IMPLANT
SUT VIC AB 2-0 CT1 27 (SUTURE) ×2
SUT VIC AB 2-0 CT1 TAPERPNT 27 (SUTURE) ×2 IMPLANT
SUT VIC AB 3-0 SH 27 (SUTURE) ×4
SUT VIC AB 3-0 SH 27X BRD (SUTURE) ×4 IMPLANT
SYR CONTROL 10ML LL (SYRINGE) IMPLANT
TOWEL GREEN STERILE (TOWEL DISPOSABLE) ×4 IMPLANT
TRAY FOLEY MTR SLVR 16FR STAT (SET/KITS/TRAYS/PACK) ×2 IMPLANT
UNDERPAD 30X30 (UNDERPADS AND DIAPERS) ×2 IMPLANT
WATER STERILE IRR 1000ML POUR (IV SOLUTION) ×2 IMPLANT

## 2019-09-15 NOTE — Op Note (Addendum)
Patient name: Raymond Walker. MRN: [id number] DOB: [birth year] Sex: male  09/15/2019 Pre-operative Diagnosis: Critical right lower extremity ischemia Post-operative diagnosis:  Same Surgeon:  Erlene Quan C. Donzetta Matters, MD Assistant: Laurence Slate Procedure Performed: 1.  Harvest right greater saphenous vein 2.  Right common femoral to above-knee popliteal artery bypass PTFE graft 3.  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  Patient has common femoral pulse on the right.  He has an occluded SFA.  Reconstitutes palpable popliteal artery.  Vein is diminutive.  He is undergone 2 weeks of antibiotics is now indicated for femoropopliteal bypass with vein versus graft.  Findings: Common femoral artery was soft and extensive inflow.  Profunda was large backbleeding.  The SFA was occluded just after the takeoff.  The above-knee popliteal artery was soft and had good flow signal prior to bypass although monophasic.  The vein was harvested was quite diminutive.  Was actually sewn in the non-reversed fashion but could not be used for bypass.  We then used bypass graft with 6 mm PTFE.  At completion and a palpable PT pulse at the ankle.  There was good bleeding in the wound bed of the toe amputation sites and the skin was easily approximated.   Procedure:  The patient was identified in the holding area and taken to the the operating room where he was placed supine operative general anesthesia induced.  Sterilely prepped and draped the right lower extremity usual fashion antibiotics were minister timeout was called.  Ultrasound was used to identify the saphenous vein this was really quite diminutive although I attempted to use it anyway.  Transverse incision was made in the groin.  We dissected out the SFA profunda and external leg arteries and placed  Vesseloops around this.  The same incision we identified the saphenous vein.  We moved to just above the knee and harvested the vein with branch between clips and ties.  We also made a small incision below the knee.  Above the knee resection of popliteal artery was noted to be soft had a monophasic signal in it there.  We then transected the vein distally back to the saphenofemoral junction where we clamped and divided.  We oversewed the saphenofemoral junction 5-0 Prolene suture in a running mattress fashion.  We then prepared our vein although it was quite diminutive thought we would need to use it anyway given his recent positive blood cultures.  We then tunneled a tunnel in anatomic plane patient was fully heparinized.  We clamped our SFA followed by our profunda and external leg arteries.  We divided our SFA distally and tied off with 2-0 silk suture.  At the takeoff of the SFA we spatulated.  We had very strong inflow good backbleeding from her profunda.  We then spatulated our vein and a non-reversed fashion sewed and and with 6-0 Prolene suture.  Upon completion we then flushed through the vein bypass.  We then lysed our valves.  Unfortunately the vein was not healthy for did not have good inflow.  At this time we then tunneled a 6 mm ringed PTFE graft.  We removed our vein graft after re-clamping our profunda and external iliac artery.  We then spatulated the graft and sewed end-to-end with 5-0 Prolene suture.  Upon completion we then flushed through the graft and tunneled this.  We had good bleeding  at the end point.  We then placed a tourniquet above the knee and exsanguinated the leg with Esmarch.  We straighten the leg and trimmed our graft to size.  We opened our above-knee popliteal artery just at the level of the joint.  It was noted to be healthy here.  We then sewed end to side with Prolene suture.  Upon completion we allowed flushing all directions.  We then had good signal distally.  50 mg of  protamine was administered.  I was attempted to repair stitch distally when the suture tore graft was then not fully attached to the artery distally.  3000 units of heparin was given graft was clamped as was the popliteal artery proximally and distally.  I then unclamped left with heparinized saline by directions.  I trimmed the previously spatulated area of the graft.  I then resected end-to-side with 6-0 Prolene suture.  Again allowed flushing all directions.  Upon completion we again had good signal distally.  We had a palpable posterior tibial pulse.  We did not give any heparin this time.  We irrigated all the wounds obtain hemostasis closed in layers with Vicryl Monocryl dermal was placed to level the skin.  We then turned our attention to the right foot.  Made a tennis racquet type incision around the first toe extended the second and third toes.  All toes were removed.  First and second metatarsal were transected with salt.  We took the joint capsule off with larger of the third metatarsal.  We irrigated obtain hemostasis.  We closed the skin with 3-0 nylon suture.  A sterile dressing was placed.  He was then awakened anesthesia having tolerated procedure without immediate complication.  Counts were correct at completion.  EBL: 450 cc   Mailen Newborn C. Donzetta Matters, MD [facility] Office: [phone] Pager: [phone]

## 2019-09-15 NOTE — Progress Notes (Signed)
Per Dr. Donzetta Matters, he does not need an ABG for this procedure, it is ok to cancel the order.

## 2019-09-15 NOTE — H&P (Signed)
H+P   History of Present Illness: This is a 68 y.o. male without significant past medical history other than as listed below.  Is current everyday smoker. Has been on antibiotics since previous admission. Now on asa, plavix, statin.      Past Medical History:  Diagnosis Date  . COPD (chronic obstructive pulmonary disease) ([place])   . Erectile dysfunction   . Impaired glucose tolerance   . Nephrolithiasis   . Pancreatitis, recurrent   . Thrombocytopenia ([place]) 08/10/2018   Staying in the low 100s will follow closely         Past Surgical History:  Procedure Laterality Date  . CATARACT EXTRACTION W/PHACO Right 05/02/2018   Procedure: CATARACT EXTRACTION PHACO AND INTRAOCULAR LENS PLACEMENT (IOC);  Surgeon: Baruch Goldmann, MD;  Location: [facility];  Service: Ophthalmology;  Laterality: Right;  CDE: 11.11  . CATARACT EXTRACTION W/PHACO Left 07/04/2018   Procedure: CATARACT EXTRACTION PHACO AND INTRAOCULAR LENS PLACEMENT (IOC);  Surgeon: Baruch Goldmann, MD;  Location: [facility];  Service: Ophthalmology;  Laterality: Left;  CDE: 7.15  . CHOLECYSTECTOMY    . ORCHIECTOMY    . VASECTOMY      No Known Allergies         Prior to Admission medications   Medication Sig Start Date End Date Taking? Authorizing Provider  acetaminophen (TYLENOL) 500 MG tablet Take 500 mg by mouth every 8 (eight) hours as needed for moderate pain.    Yes [provider]    Social History        Socioeconomic History  . Marital status: Legally Separated    Spouse name: Not on file  . Number of children: Not on file  . Years of education: Not on file  . Highest education level: Not on file  Occupational History  . Occupation: Art gallery manager: [employer]  . Financial resource strain: Not on file  . Food insecurity    Worry: Not on file    Inability: Not on file  . Transportation needs    Medical: Not on file    Non-medical:  Not on file  Tobacco Use  . Smoking status: Current Every Day Smoker    Packs/day: 1.50    Types: Cigarettes  . Smokeless tobacco: Never Used  Substance and Sexual Activity  . Alcohol use: No  . Drug use: No  . Sexual activity: Not on file  Lifestyle  . Physical activity    Days per week: Not on file    Minutes per session: Not on file  . Stress: Not on file  Relationships  . Social Herbalist on phone: Not on file    Gets together: Not on file    Attends religious service: Not on file    Active member of club or organization: Not on file    Attends meetings of clubs or organizations: Not on file    Relationship status: Not on file  . Intimate partner violence    Fear of current or ex partner: Not on file    Emotionally abused: Not on file    Physically abused: Not on file    Forced sexual activity: Not on file  Other Topics Concern  . Not on file  Social History Narrative  . Not on file          Family History  Problem Relation Age of Onset  . Hypertension Father   . Coronary artery disease Father   .  Coronary artery disease Mother   . Cancer Mother        Breast    ROS: Right foot wound pain   Physical Examination      Vitals:   08/20/19 0047 08/20/19 0339  BP: (!) 165/105 (!) 157/88  Pulse: 93 82  Resp: 20 20  Temp: (!) 100.4 F (38 C) (!) 100.6 F (38.1 C)  SpO2: 96% 99%   Body mass index is 19.02 kg/m.  General: NAD HENT: normocephalic Pulmonary: normal non-labored breathing Cardiac: palpable right femoral pulse Abdomen: soft, NT/ND, no masses Extremities: Right foot with multiple toes gangrenous changes Musculoskeletal: no muscle wasting or atrophy       Neurologic: A&O X 3; Appropriate Affect ; SENSATION: normal; MOTOR FUNCTION:  moving all extremities equally. Speech is fluent/normal   Non-Invasive Vascular Imaging:   ABI 0.52/0.56  ASSESSMENT/PLAN: This is a 68 y.o. male here  with gangrenous changes right toes 1-3. Plan for right fem-pop bypass with vein vs graft and amputation of effected toes.   Cleo Villamizar C. Donzetta Matters, MD [facility] [place] Office: [phone] Pager: [phone]

## 2019-09-15 NOTE — Anesthesia Procedure Notes (Signed)
Procedure Name: Intubation Date/Time: 09/15/2019 7:39 AM Performed by: Imagene Riches, CRNA Pre-anesthesia Checklist: Patient identified, Emergency Drugs available, Suction available and Patient being monitored Patient Re-evaluated:Patient Re-evaluated prior to induction Oxygen Delivery Method: Circle System Utilized Preoxygenation: Pre-oxygenation with 100% oxygen Induction Type: IV induction Ventilation: Mask ventilation without difficulty Laryngoscope Size: Miller and 2 Grade View: Grade I Tube type: Oral Tube size: 7.5 mm Number of attempts: 1 Airway Equipment and Method: Stylet and Oral airway Placement Confirmation: ETT inserted through vocal cords under direct vision,  positive ETCO2 and breath sounds checked- equal and bilateral Secured at: 22 cm Tube secured with: Tape Dental Injury: Teeth and Oropharynx as per pre-operative assessment

## 2019-09-15 NOTE — Anesthesia Preprocedure Evaluation (Signed)
Anesthesia Evaluation  Patient identified by MRN, date of birth, ID band Patient awake    Reviewed: Allergy & Precautions, NPO status , Patient's Chart, lab work & pertinent test results  History of Anesthesia Complications Negative for: history of anesthetic complications  Airway Mallampati: II  TM Distance: >3 FB Neck ROM: Full    Dental  (+) Edentulous Upper, Edentulous Lower   Pulmonary COPD, neg recent URI, former smoker,    breath sounds clear to auscultation       Cardiovascular hypertension, + CAD   Rhythm:Regular     Neuro/Psych negative neurological ROS  negative psych ROS   GI/Hepatic negative GI ROS, Neg liver ROS,   Endo/Other  diabetes  Renal/GU negative Renal ROS     Musculoskeletal negative musculoskeletal ROS (+)   Abdominal   Peds  Hematology negative hematology ROS (+)   Anesthesia Other Findings   Reproductive/Obstetrics                             Anesthesia Physical Anesthesia Plan  ASA: III  Anesthesia Plan: General   Post-op Pain Management:    Induction: Intravenous  PONV Risk Score and Plan: 2 and Ondansetron and Dexamethasone  Airway Management Planned: Oral ETT  Additional Equipment: None  Intra-op Plan:   Post-operative Plan: Extubation in OR  Informed Consent: I have reviewed the patients History and Physical, chart, labs and discussed the procedure including the risks, benefits and alternatives for the proposed anesthesia with the patient or authorized representative who has indicated his/her understanding and acceptance.     Dental advisory given  Plan Discussed with: CRNA and Surgeon  Anesthesia Plan Comments:         Anesthesia Quick Evaluation

## 2019-09-15 NOTE — Progress Notes (Signed)
   Called by RN to report Hgb 6.7 with pre op Hgb 12.5.  He had EBL of 450 ml during surgery.Raymond Walker He is hypotensive, but he has a history of hypotensive BP in the low 100's documented from his previous admission.  He has received 2 500 cc NS boluses.    O2 SAT 100 %, HR 80-85.  Urine OP is good foley to gravity 265 cc since transferring to the floor. He is A & O x 3 and moving all 4 ext. Doppler signal brisk right PT, foot dressing clean and dry.  Small hematoma with ecchymosis at the above popliteal incision marked for evidence of future expansion.  No frank expanding hematoma evidence currently.  Stable disposition pending CBC.  Roxy Horseman PA-C

## 2019-09-15 NOTE — Progress Notes (Signed)
CRITICAL VALUE ALERT  Critical Value: hgb 6.7  Date & Time Notied:  9/29 1628  Provider Notified: Bailey Mech, PA  Orders Received/Actions taken: recheck

## 2019-09-15 NOTE — Transfer of Care (Signed)
Immediate Anesthesia Transfer of Care Note  Patient: Raymond Walker.  Procedure(s) Performed: Right BYPASS GRAFT FEMORAL to Above Knee POPLITEAL ARTERY (Right ) AMPUTATION RIGHT TOES One, Two, And Three (Right )  Patient Location: PACU  Anesthesia Type:General  Level of Consciousness: awake, alert  and oriented  Airway & Oxygen Therapy: Patient Spontanous Breathing and Patient connected to face mask oxygen  Post-op Assessment: Report given to RN and Post -op Vital signs reviewed and stable  Post vital signs: Reviewed and stable  Last Vitals:  Vitals Value Taken Time  BP 100/63 09/15/19 1208  Temp    Pulse 96 09/15/19 1211  Resp 20 09/15/19 1211  SpO2 100 % 09/15/19 1211  Vitals shown include unvalidated device data.  Last Pain:  Vitals:   09/15/19 0706  TempSrc:   PainSc: 0-No pain      Patients Stated Pain Goal: 0 ([id number])  Complications: No apparent anesthesia complications

## 2019-09-15 NOTE — Progress Notes (Signed)
    Post right fem to above knee pop bypass Doppler signal PT Groin soft, incisions healing well without hematoma   Stable disposition  Roxy Horseman PA-C

## 2019-09-15 NOTE — Progress Notes (Signed)
Second procedure infection present

## 2019-09-16 ENCOUNTER — Encounter (HOSPITAL_COMMUNITY): Payer: Self-pay | Admitting: Vascular Surgery

## 2019-09-16 ENCOUNTER — Encounter (HOSPITAL_COMMUNITY): Payer: Medicare Other

## 2019-09-16 DIAGNOSIS — Z89429 Acquired absence of other toe(s), unspecified side: Secondary | ICD-10-CM

## 2019-09-16 DIAGNOSIS — R7881 Bacteremia: Secondary | ICD-10-CM

## 2019-09-16 DIAGNOSIS — I96 Gangrene, not elsewhere classified: Secondary | ICD-10-CM

## 2019-09-16 DIAGNOSIS — F17211 Nicotine dependence, cigarettes, in remission: Secondary | ICD-10-CM

## 2019-09-16 DIAGNOSIS — Z95820 Peripheral vascular angioplasty status with implants and grafts: Secondary | ICD-10-CM

## 2019-09-16 LAB — CBC
HCT: 23.1 % — ABNORMAL LOW (ref 39.0–52.0)
Hemoglobin: 7.8 g/dL — ABNORMAL LOW (ref 13.0–17.0)
MCH: 30.8 pg (ref 26.0–34.0)
MCHC: 33.8 g/dL (ref 30.0–36.0)
MCV: 91.3 fL (ref 80.0–100.0)
Platelets: 125 10*3/uL — ABNORMAL LOW (ref 150–400)
RBC: 2.53 MIL/uL — ABNORMAL LOW (ref 4.22–5.81)
RDW: 14.6 % (ref 11.5–15.5)
WBC: 8.4 10*3/uL (ref 4.0–10.5)
nRBC: 0 % (ref 0.0–0.2)

## 2019-09-16 LAB — BASIC METABOLIC PANEL
Anion gap: 6 (ref 5–15)
BUN: 18 mg/dL (ref 8–23)
CO2: 23 mmol/L (ref 22–32)
Calcium: 7.7 mg/dL — ABNORMAL LOW (ref 8.9–10.3)
Chloride: 110 mmol/L (ref 98–111)
Creatinine, Ser: 0.77 mg/dL (ref 0.61–1.24)
GFR calc Af Amer: >60 mL/min (ref >60)
GFR calc non Af Amer: >60 mL/min (ref >60)
Glucose, Bld: 125 mg/dL — ABNORMAL HIGH (ref 70–99)
Potassium: 4.1 mmol/L (ref 3.5–5.1)
Sodium: 139 mmol/L (ref 135–145)

## 2019-09-16 MED ORDER — CEFAZOLIN SODIUM-DEXTROSE 1-4 GM/50ML-% IV SOLN
1.0000 g | Freq: Three times a day (TID) | INTRAVENOUS | Status: AC
  Administered 2019-09-16 – 2019-09-18 (×6): 1 g via INTRAVENOUS
  Filled 2019-09-16 (×7): qty 50

## 2019-09-16 NOTE — Evaluation (Signed)
Physical Therapy Evaluation Patient Details Name: Raymond Walker. MRN: [id number] DOB: [birth year] Today's Date: 09/16/2019   History of Present Illness  Pt is a 68 y/o male s/p R 1-3 toe amputations. PMH including but not limited to COPD and current smoker.  Clinical Impression  Pt presented supine in bed with HOB elevated, awake and willing to participate in therapy session. Prior to admission, pt reported that he ambulated with use of a cane and was independent with ADLs. Pt lives in an apartment in his sister's house with four steps to enter. At the time of evaluation, pt required max A for bed mobility and mod A x2 for transfers. Pt very limited secondary to pain and fatigue. All VSS throughout. Pt would continue to benefit from skilled physical therapy services at this time while admitted and after d/c to address the below listed limitations in order to improve overall safety and independence with functional mobility.     Follow Up Recommendations SNF    Equipment Recommendations  None recommended by PT    Recommendations for Other Services       Precautions / Restrictions Precautions Precautions: Fall Required Braces or Orthoses: Other Brace Other Brace: darco shoe(not present upon eval) Restrictions Weight Bearing Restrictions: Yes Other Position/Activity Restrictions: through heel with DARCO shoe      Mobility  Bed Mobility Overal bed mobility: Needs Assistance Bed Mobility: Supine to Sit     Supine to sit: Max assist     General bed mobility comments: increased time and effort, assistance needed with R LE movement off of bed, use of bed pads to position pt's hips at EOB  Transfers Overall transfer level: Needs assistance Equipment used: Rolling walker (2 wheeled) Transfers: Sit to/from [facility] Sit to Stand: Mod assist;+2 physical assistance Stand pivot transfers: Mod assist;+2 physical assistance       General transfer comment: bed in  elevated position, cueing for technique, assistance to power into standing from EOB and for pivotal movement to chair towards pt's L side  Ambulation/Gait             General Gait Details: pt with difficulty hopping on L LE with RW and mod A x2; pt also very anxious and with increasing pain  Stairs            Wheelchair Mobility    Modified Rankin (Stroke Patients Only)       Balance Overall balance assessment: Needs assistance Sitting-balance support: Feet supported Sitting balance-Leahy Scale: Fair     Standing balance support: Bilateral upper extremity supported Standing balance-Leahy Scale: Poor Standing balance comment: heavy reliance on bilateral UEs                             Pertinent Vitals/Pain Pain Assessment: Faces Faces Pain Scale: Hurts whole lot Pain Location: R hip and foot Pain Descriptors / Indicators: Discomfort;Guarding;Grimacing Pain Intervention(s): Monitored during session;Repositioned    Home Living Family/patient expects to be discharged to:: Private residence Living Arrangements: Alone Available Help at Discharge: Family;Friend(s);Neighbor;Available 24 hours/day Type of Home: House Home Access: Stairs to enter Entrance Stairs-Rails: Psychiatric nurse of Steps: 4 Home Layout: One level Home Equipment: Cane - single point;Shower seat;Grab bars - tub/shower      Prior Function Level of Independence: Independent with assistive device(s)         Comments: ambulates with use of a cane     Hand Dominance  Dominant Hand: Right    Extremity/Trunk Assessment   Upper Extremity Assessment Upper Extremity Assessment: Defer to OT evaluation RUE Deficits / Details: notable tremor following transfer, pt reports this has been occurring for the past week, rn notified. Pt emotionally labile stating "i feel like I have no control over my arm" and crying.     Lower Extremity Assessment Lower Extremity  Assessment: RLE deficits/detail RLE Deficits / Details: pt with decreased strength and ROM limitations secondary to post-op pain and weakness    Cervical / Trunk Assessment Cervical / Trunk Assessment: Kyphotic  Communication   Communication: No difficulties  Cognition Arousal/Alertness: Awake/alert Behavior During Therapy: Anxious Overall Cognitive Status: Within Functional Limits for tasks assessed                                        General Comments General comments (skin integrity, edema, etc.): VSS throughout session    Exercises     Assessment/Plan    PT Assessment Patient needs continued PT services  PT Problem List Decreased strength;Decreased range of motion;Decreased activity tolerance;Decreased balance;Decreased mobility;Decreased coordination;Decreased knowledge of use of DME;Decreased safety awareness;Decreased knowledge of precautions;Pain       PT Treatment Interventions DME instruction;Gait training;Stair training;Functional mobility training;Neuromuscular re-education;Therapeutic activities;Therapeutic exercise;Balance training;Patient/family education    PT Goals (Current goals can be found in the Care Plan section)  Acute Rehab PT Goals Patient Stated Goal: decrease pain; figure out why his R arm has been intermittently shaking for the past week PT Goal Formulation: With patient Time For Goal Achievement: 09/30/19 Potential to Achieve Goals: Good    Frequency Min 3X/week   Barriers to discharge        Co-evaluation PT/OT/SLP Co-Evaluation/Treatment: Yes Reason for Co-Treatment: For patient/therapist safety;To address functional/ADL transfers;Other (comment)(pt tolerance) PT goals addressed during session: Mobility/safety with mobility;Proper use of DME;Balance;Strengthening/ROM OT goals addressed during session: ADL's and self-care       AM-PAC PT "6 Clicks" Mobility  Outcome Measure Help needed turning from your back to your  side while in a flat bed without using bedrails?: Total Help needed moving from lying on your back to sitting on the side of a flat bed without using bedrails?: Total Help needed moving to and from a bed to a chair (including a wheelchair)?: A Lot Help needed standing up from a chair using your arms (e.g., wheelchair or bedside chair)?: A Lot Help needed to walk in hospital room?: A Lot Help needed climbing 3-5 steps with a railing? : Total 6 Click Score: 9    End of Session Equipment Utilized During Treatment: Gait belt Activity Tolerance: Patient limited by fatigue;Patient limited by pain Patient left: in chair;with call bell/phone within reach;Other (comment)(chair alarm box not present in room) Nurse Communication: Mobility status PT Visit Diagnosis: Other abnormalities of gait and mobility (R26.89);Pain Pain - Right/Left: Right Pain - part of body: Ankle and joints of foot    Time: 1341-1400 PT Time Calculation (min) (ACUTE ONLY): 19 min   Charges:   PT Evaluation $PT Eval Moderate Complexity: 1 Mod          Sherie Don, PT, DPT  Acute Rehabilitation Services Pager [phone] Office [place] 09/16/2019, 3:23 PM

## 2019-09-16 NOTE — Progress Notes (Addendum)
[facility]  Subjective  - No new complaints.   Objective 117/63 79 99.6 F (37.6 C) (Oral) 13 98%  Intake/Output Summary (Last 24 hours) at 09/16/2019 0715 Last data filed at 09/16/2019 0358 Gross per 24 hour  Intake 4977.67 ml  Output 1190 ml  Net 3787.67 ml    Right groin soft, right popliteal incision with min fullness no frank hematoma. PT doppler signal right LE Lungs non labored breathing Heart RRR, hypotension baseline   Assessment/Planning: POD # 1  Procedure Performed: 1.  Harvest right greater saphenous vein 2.  Right common femoral to above-knee popliteal artery bypass PTFE graft 3.  Amputation right toes 1-3 Will order darco shoe for heel weight bearing right LE Plan to change the dressing right foot tomorrow Will contact ID for recommendation to d/c PICC line   Roxy Horseman 09/16/2019 7:15 AM --  Laboratory Lab Results: Recent Labs    09/15/19 1540 09/15/19 1706  WBC 11.1* 9.9  HGB 6.7* 6.2*  HCT 20.8* 19.0*  PLT 117* 124*   BMET Recent Labs    09/15/19 0652 09/15/19 1540 09/15/19 1706  NA 138 141  --   K 3.7 4.4  --   CL 103 112*  --   CO2 26 21*  --   GLUCOSE 116* 167*  --   BUN 15 18  --   CREATININE 0.76 0.96 0.85  CALCIUM 9.3 7.9*  --     COAG Lab Results  Component Value Date   INR 1.2 09/15/2019   INR 1.1 08/19/2019   INR 1.09 09/17/2015   No results found for: PTT   I have independently interviewed and examined patient and agree with PA assessment and plan above. Transfused last night and bp improved. Will f/u cbc.  Modena Bellemare C. Donzetta Matters, MD [facility] [place] Office: [phone] Pager: [phone]

## 2019-09-16 NOTE — Progress Notes (Signed)
Occupational Therapy Evaluation Patient Details Name: Raymond Walker. MRN: [id number] DOB: 1950/12/30 Today's Date: 09/16/2019    History of Present Illness 68 year old with past medical history significant for tobacco abuse, glucose intolerance, orchiectomy and vasectomy, rectal dysfunction, nephrolithiasis who was not on any medication prior to admission presented to the ED complaining of blackening and ulcer on the toes of his right foot.  Found to have MSSA bacteremia and needing revascularization of R LE.   Clinical Impression   PTA, pt was living at home alone with family/friends available PRN, pt reports he was independent with ADL/IADL and functional mobility at spc level. Pt currently requires modA+2 for stand-pivot transfer to recliner and setupA for UB ADL while seated. Pt limited by pain, able to maintain NWB through RLE. No DARCO shoe present in room at this time. Following transfer, notable tremor in RUE, pt report it has been occurring for the past week. RN notified.  Due to decline in current level of function, pt would benefit from acute OT to address established goals to facilitate safe D/C to venue listed below. At this time, recommend SNF follow-up. Will continue to follow acutely.     Follow Up Recommendations  SNF    Equipment Recommendations  3 in 1 bedside commode    Recommendations for Other Services       Precautions / Restrictions Precautions Precautions: Fall Required Braces or Orthoses: Other Brace Other Brace: darco shoe Restrictions Weight Bearing Restrictions: No      Mobility Bed Mobility Overal bed mobility: Needs Assistance Bed Mobility: Supine to Sit     Supine to sit: Max assist     General bed mobility comments: maxA to progress BLE toward EOB, progress trunk upright and progress hips toward EOB with use of bed pad  Transfers Overall transfer level: Needs assistance Equipment used: Rolling walker (2 wheeled) Transfers: Sit to/from  [facility] Sit to Stand: Mod assist;+2 physical assistance Stand pivot transfers: Mod assist;+2 physical assistance       General transfer comment: modA+2 for powerup, stability and proper use of DMe    Balance Overall balance assessment: Needs assistance Sitting-balance support: Feet supported;Single extremity supported Sitting balance-Leahy Scale: Fair     Standing balance support: Bilateral upper extremity supported Standing balance-Leahy Scale: Poor Standing balance comment: heavy reliance on BUE                           ADL either performed or assessed with clinical judgement   ADL Overall ADL's : Needs assistance/impaired Eating/Feeding: Set up;Sitting   Grooming: Set up;Sitting   Upper Body Bathing: Set up;Sitting   Lower Body Bathing: Moderate assistance;+2 for physical assistance;Sit to/from stand   Upper Body Dressing : Set up;Sitting   Lower Body Dressing: Moderate assistance;+2 for physical assistance;Sit to/from stand   Toilet Transfer: Moderate assistance;+2 for physical assistance;Stand-pivot Toilet Transfer Details (indicate cue type and reason): simulated transfer from EOB to recliner Toileting- Clothing Manipulation and Hygiene: Moderate assistance;+2 for physical assistance;Sit to/from stand       Functional mobility during ADLs: Moderate assistance;+2 for physical assistance;Rolling walker General ADL Comments: pt maintained NWB through RLE;limited functioanlly due to RUE tremor, pain and weakness     Vision         Perception     Praxis      Pertinent Vitals/Pain Pain Assessment: Faces Faces Pain Scale: Hurts whole lot Pain Location: R leg Pain Descriptors / Indicators: Discomfort;Guarding;Grimacing  Pain Intervention(s): Monitored during session;Limited activity within patient's tolerance     Hand Dominance Right   Extremity/Trunk Assessment Upper Extremity Assessment Upper Extremity Assessment: RUE  deficits/detail RUE Deficits / Details: notable tremor following transfer, pt reports this has been occurring for the past week, rn notified. Pt emotionally labile stating "i feel like I have no control over my arm" and crying.    Lower Extremity Assessment Lower Extremity Assessment: Defer to PT evaluation   Cervical / Trunk Assessment Cervical / Trunk Assessment: Kyphotic   Communication Communication Communication: No difficulties   Cognition Arousal/Alertness: Awake/alert Behavior During Therapy: WFL for tasks assessed/performed Overall Cognitive Status: Within Functional Limits for tasks assessed                                     General Comments  VSS throughout session    Exercises     Shoulder Instructions      Home Living Family/patient expects to be discharged to:: Private residence Living Arrangements: Alone Available Help at Discharge: Family;Friend(s);Neighbor;Available 24 hours/day Type of Home: House Home Access: Stairs to enter [employer] of Steps: 4 Entrance Stairs-Rails: Right;Left Home Layout: One level     Bathroom Shower/Tub: Occupational psychologist: Standard     Home Equipment: Cane - single point;Shower seat;Grab bars - tub/shower          Prior Functioning/Environment Level of Independence: Independent with assistive device(s)        Comments: ambulates with use of a cane        OT Problem List: Decreased activity tolerance;Impaired balance (sitting and/or standing);Decreased safety awareness;Decreased knowledge of use of DME or AE;Decreased knowledge of precautions;Pain      OT Treatment/Interventions: Self-care/ADL training;Therapeutic exercise;Energy conservation;DME and/or AE instruction;Therapeutic activities;Patient/family education;Balance training    OT Goals(Current goals can be found in the care plan section) Acute Rehab OT Goals Patient Stated Goal: to go home OT Goal Formulation:  With patient Time For Goal Achievement: 09/30/19 Potential to Achieve Goals: Good ADL Goals Pt Will Perform Grooming: with modified independence;standing Pt Will Perform Upper Body Dressing: with modified independence Pt Will Perform Lower Body Dressing: with modified independence;sit to/from stand Pt Will Transfer to Toilet: with modified independence;ambulating  OT Frequency: Min 2X/week   Barriers to D/C: Inaccessible home environment  pt has 4 steps to enter       Co-evaluation PT/OT/SLP Co-Evaluation/Treatment: Yes Reason for Co-Treatment: For patient/therapist safety;To address functional/ADL transfers   OT goals addressed during session: ADL's and self-care      AM-PAC OT "6 Clicks" Daily Activity     Outcome Measure Help from another person eating meals?: A Little Help from another person taking care of personal grooming?: A Little Help from another person toileting, which includes using toliet, bedpan, or urinal?: A Lot Help from another person bathing (including washing, rinsing, drying)?: A Lot Help from another person to put on and taking off regular upper body clothing?: A Little Help from another person to put on and taking off regular lower body clothing?: A Lot 6 Click Score: 15   End of Session Equipment Utilized During Treatment: Gait belt;Rolling walker Nurse Communication: Mobility status;Patient requests pain meds(tremor in RUE)  Activity Tolerance: Patient limited by pain Patient left: in chair;with call bell/phone within reach  OT Visit Diagnosis: Unsteadiness on feet (R26.81);Other abnormalities of gait and mobility (R26.89);Muscle weakness (generalized) (M62.81);Pain Pain - Right/Left: Right  Pain - part of body: Leg                Time: [id number] OT Time Calculation (min): 32 min Charges:  OT General Charges $OT Visit: 1 Visit OT Evaluation $OT Eval Moderate Complexity: [place] OTR/L Acute Rehabilitation Services Office:  [place] 09/16/2019, 2:50 PM

## 2019-09-16 NOTE — Plan of Care (Signed)
  Problem: Clinical Measurements: Goal: Will remain free from infection Outcome: Progressing   Problem: Nutrition: Goal: Adequate nutrition will be maintained Outcome: Progressing   

## 2019-09-16 NOTE — Anesthesia Postprocedure Evaluation (Signed)
Anesthesia Post Note  Patient: Raymond Walker.  Procedure(s) Performed: Right BYPASS GRAFT FEMORAL to Above Knee POPLITEAL ARTERY (Right ) AMPUTATION RIGHT TOES One, Two, And Three (Right )     Patient location during evaluation: PACU Anesthesia Type: General Level of consciousness: awake and alert Pain management: pain level controlled Vital Signs Assessment: post-procedure vital signs reviewed and stable Respiratory status: spontaneous breathing, nonlabored ventilation, respiratory function stable and patient connected to nasal cannula oxygen Cardiovascular status: blood pressure returned to baseline and stable Postop Assessment: no apparent nausea or vomiting Anesthetic complications: no    Last Vitals:  Vitals:   09/16/19 1400 09/16/19 1500  BP: (!) 159/75 93/69  Pulse:    Resp: 20 14  Temp:    SpO2:      Last Pain:  Vitals:   09/16/19 1100  TempSrc: Oral  PainSc:                  Shaneeka Scarboro

## 2019-09-16 NOTE — Consult Note (Signed)
[facility] for Infectious Disease       Reason for Consult:  osteomyelitis Referring Physician: Dr. Donzetta Matters  Active Problems:   PAD (peripheral artery disease) ([place])   . sodium chloride   Intravenous Once  . aspirin EC  81 mg Oral Daily  . Chlorhexidine Gluconate Cloth  6 each Topical Daily  . clopidogrel  75 mg Oral Q breakfast  . docusate sodium  100 mg Oral Daily  . feeding supplement (ENSURE ENLIVE)  237 mL Oral BID BM  . heparin  5,000 Units Subcutaneous Q8H  . multivitamin with minerals  1 tablet Oral Daily  . nutrition supplement (JUVEN)  1 packet Oral BID BM  . pantoprazole  40 mg Oral Daily  . polyethylene glycol  17 g Oral Daily  . rosuvastatin  20 mg Oral Daily  . senna  1 tablet Oral QHS    Recommendations: Continue with cefazolin 24-48 hours Pull picc line at discharge No further antibiotics indicated after that.   Assessment: He developed bacteremia due to his gangrenous toes and has been on IV cefazolin for 4 weeks. TTE did not suggest endocarditis.  He now has had amputation of the toes with good gross margins.    Antibiotics: cefazolin  HPI: Raymond Walker. is a 68 y.o. male previous smoker (recently quit) with critical limb ischemia and developed bacteremia as above.  Now s/p amputation.  He also underwent right common femoral to above-knee popliteal artery bypass PTFE graft with good flow.  He has had no fever or chills.  No new complaints.  Repeat blood cultures did remain negative.  No associated rash or diarrhea.    Review of Systems:  Constitutional: negative for fevers and chills Gastrointestinal: negative for nausea and diarrhea Integument/breast: negative for rash All other systems reviewed and are negative    Past Medical History:  Diagnosis Date  . COPD (chronic obstructive pulmonary disease) ([place])   . Erectile dysfunction   . History of kidney stones   . Hypertension   . Impaired glucose tolerance   . Pancreatitis, recurrent    . Thrombocytopenia ([place]) 08/10/2018   Staying in the low 100s will follow closely    Social History   Tobacco Use  . Smoking status: Former Smoker    Packs/day: 1.50    Types: Cigarettes  . Smokeless tobacco: Never Used  . Tobacco comment: stopped on admission to hospital on 08/19/19  Substance Use Topics  . Alcohol use: No  . Drug use: No    Family History  Problem Relation Age of Onset  . Hypertension Father   . Coronary artery disease Father   . Coronary artery disease Mother   . Cancer Mother        Breast    No Known Allergies  Physical Exam: Constitutional: in no apparent distress  Vitals:   09/16/19 0510 09/16/19 0600  BP: 113/86 117/63  Pulse: 74 79  Resp: 19 13  Temp: 99.6 F (37.6 C)   SpO2: 99% 98%   EYES: anicteric ENMT: no thrush Cardiovascular: Cor RRR Respiratory: CTA B; normal respiratory effort GI: Bowel sounds are normal, liver is not enlarged, spleen is not enlarged} Skin: negatives: no rash Neuro: non-focal  Lab Results  Component Value Date   WBC 8.4 09/16/2019   HGB 7.8 (L) 09/16/2019   HCT 23.1 (L) 09/16/2019   MCV 91.3 09/16/2019   PLT 125 (L) 09/16/2019    Lab Results  Component Value Date  CREATININE 0.77 09/16/2019   BUN 18 09/16/2019   NA 139 09/16/2019   K 4.1 09/16/2019   CL 110 09/16/2019   CO2 23 09/16/2019    Lab Results  Component Value Date   ALT 13 09/15/2019   AST 18 09/15/2019   ALKPHOS 86 09/15/2019     Microbiology: Recent Results (from the past 240 hour(s))  SARS Coronavirus 2 [facility] order, Performed in [facility] hospital lab) Nasopharyngeal Nasopharyngeal Swab     Status: None   Collection Time: 09/15/19  5:53 AM   Specimen: Nasopharyngeal Swab  Result Value Ref Range Status   SARS Coronavirus 2 NEGATIVE NEGATIVE Final    Comment: (NOTE) If result is NEGATIVE SARS-CoV-2 target nucleic acids are NOT DETECTED. The SARS-CoV-2 RNA is generally detectable in upper and lower  respiratory  specimens during the acute phase of infection. The lowest  concentration of SARS-CoV-2 viral copies this assay can detect is 250  copies / mL. A negative result does not preclude SARS-CoV-2 infection  and should not be used as the sole basis for treatment or other  patient management decisions.  A negative result may occur with  improper specimen collection / handling, submission of specimen other  than nasopharyngeal swab, presence of viral mutation(s) within the  areas targeted by this assay, and inadequate number of viral copies  (<250 copies / mL). A negative result must be combined with clinical  observations, patient history, and epidemiological information. If result is POSITIVE SARS-CoV-2 target nucleic acids are DETECTED. The SARS-CoV-2 RNA is generally detectable in upper and lower  respiratory specimens dur ing the acute phase of infection.  Positive  results are indicative of active infection with SARS-CoV-2.  Clinical  correlation with patient history and other diagnostic information is  necessary to determine patient infection status.  Positive results do  not rule out bacterial infection or co-infection with other viruses. If result is PRESUMPTIVE POSTIVE SARS-CoV-2 nucleic acids MAY BE PRESENT.   A presumptive positive result was obtained on the submitted specimen  and confirmed on repeat testing.  While 2019 novel coronavirus  (SARS-CoV-2) nucleic acids may be present in the submitted sample  additional confirmatory testing may be necessary for epidemiological  and / or clinical management purposes  to differentiate between  SARS-CoV-2 and other Sarbecovirus currently known to infect humans.  If clinically indicated additional testing with an alternate test  methodology [id number]) is advised. The SARS-CoV-2 RNA is generally  detectable in upper and lower respiratory sp ecimens during the acute  phase of infection. The expected result is Negative. Fact Sheet for  Patients:  StrictlyIdeas.no Fact Sheet for Healthcare Providers: BankingDealers.co.za This test is not yet approved or cleared by the [place] FDA and has been authorized for detection and/or diagnosis of SARS-CoV-2 by FDA under an Emergency Use Authorization (EUA).  This EUA will remain in effect (meaning this test can be used) for the duration of the COVID-19 declaration under Section 564(b)(1) of the Act, 21 U.S.C. section 360bbb-3(b)(1), unless the authorization is terminated or revoked sooner. Performed at [facility], [place] [address] [place], [place] [address]     Farmer W Maxtyn Nuzum, [place] [facility] Infectious Disease [facility] [email or web address] 09/16/2019, 10:10 AM

## 2019-09-17 LAB — TYPE AND SCREEN
ABO/RH(D): A POS
Antibody Screen: NEGATIVE
Unit division: 0
Unit division: 0

## 2019-09-17 LAB — BPAM RBC
Blood Product Expiration Date: 202010162359
Blood Product Expiration Date: 202010212359
ISSUE DATE / TIME: 202009300015
ISSUE DATE / TIME: 202009300351
Unit Type and Rh: 6200
Unit Type and Rh: 6200

## 2019-09-17 NOTE — Progress Notes (Signed)
Physical Therapy Treatment Patient Details Name: Raymond Walker. MRN: [id number] DOB: August 02, 1951 Today's Date: 09/17/2019    History of Present Illness Pt is a 68 y/o male s/p R 1-3 toe amputations. PMH including but not limited to COPD and current smoker.    PT Comments    Today's skilled session continued to focus on right LE strengthening/ROM and general mobility. Had pt maintain NWB with transfers on right LE due to Darco shoe not in room. Paged Ortho Tech to have shoe delivered with no return call. Notified nurse secretary who is to page again to relay message. The pt needed less overall assistance with bed mobility and transfer today. Also reporting no pain today. Acute PT to continue during pt's hospital stay.     Follow Up Recommendations  SNF     Equipment Recommendations  None recommended by PT    Precautions / Restrictions Precautions Precautions: Fall Required Braces or Orthoses: Other Brace Other Brace: darco shoe (not in room) Restrictions Other Position/Activity Restrictions: through heel with DARCO shoe    Mobility  Bed Mobility   Bed Mobility: Supine to Sit     Supine to sit: Min guard     General bed mobility comments: with HOB 20 degrees and rail used. no physical assistance needed, cues for technique for ease of movement. increased time and effort needed.  Transfers Overall transfer level: Needs assistance Equipment used: Rolling walker (2 wheeled) Transfers: Sit to/from [facility] Sit to Stand: Min assist Stand pivot transfers: Min guard       General transfer comment: bed slightly elevated to bring hips just above knees due to pt height. cues on safety with hand placement and for weight shifting. with RW pt performe 5-6 lateral "hops" from bed to chair with min guard assist. Pt able to self control/maneuver walker. once at chair pt able to sit with controlled decent with good/safe hand placement. Had pt maintain NWB on right  foot/LE throughout session due to no Darco shoe present in room.      Cognition   Behavior During Therapy: WFL for tasks assessed/performed Overall Cognitive Status: Within Functional Limits for tasks assessed                Exercises General Exercises - Lower Extremity Ankle Circles/Pumps: AROM;Strengthening;Both;10 reps;Supine Heel Slides: AAROM;Strengthening;Right;10 reps;Supine Hip ABduction/ADduction: Strengthening;AAROM;Right;10 reps;Supine Straight Leg Raises: AAROM;Strengthening;Right;10 reps;Supine     Pertinent Vitals/Pain Pain Assessment: 0-10 Faces Pain Scale: No hurt     PT Goals (current goals can now be found in the care plan section) Acute Rehab PT Goals Patient Stated Goal: decrease pain; figure out why his R arm has been intermittently shaking for the past week PT Goal Formulation: With patient Time For Goal Achievement: 09/30/19 Potential to Achieve Goals: Good Progress towards PT goals: Progressing toward goals    Frequency    Min 3X/week      PT Plan Current plan remains appropriate    AM-PAC PT "6 Clicks" Mobility   Outcome Measure  Help needed turning from your back to your side while in a flat bed without using bedrails?: A Little Help needed moving from lying on your back to sitting on the side of a flat bed without using bedrails?: A Little Help needed moving to and from a bed to a chair (including a wheelchair)?: A Little Help needed standing up from a chair using your arms (e.g., wheelchair or bedside chair)?: A Little Help needed to walk in hospital room?:  A Lot Help needed climbing 3-5 steps with a railing? : Total 6 Click Score: 15    End of Session Equipment Utilized During Treatment: Gait belt Activity Tolerance: Patient tolerated treatment well Patient left: in chair;with call bell/phone within reach;with chair alarm set Nurse Communication: Mobility status PT Visit Diagnosis: Other abnormalities of gait and mobility  (R26.89);Pain     Time: [id number] PT Time Calculation (min) (ACUTE ONLY): 26 min  Charges:  $Therapeutic Activity: 23-37 mins                     Willow Ora, [place], [facility] [address] [place], [place] [address] [phone] 09/17/19, 9:32 AM   Willow Ora 09/17/2019, 9:29 AM

## 2019-09-17 NOTE — Progress Notes (Signed)
Chaplain responded to nurse's request for assistance with an upset pt. Braxley was distressed over his children's desire to gain access to his finances and control his medical care. Jeriah has expressed that he would like to have his niece [name] be the next of kin listed on his medical chart and Damico wants to instate an advanced directive making his niece his healthcare POA. We made an effort to notarize Consuelo's Adv. Dir today Sep 17 2019, but there were not volunteers available to act as witnesses. We will make another attempt on Friday morning September 18, 2019. Chaplain remains available per request.   Chaplain Resident, Evelene Croon, M. [facility] Pager # [phone] personal Pager # [phone] on-call

## 2019-09-17 NOTE — Progress Notes (Signed)
Orthopedic Tech Progress Note Patient Details:  Bow Ader 04-23-1951 [id number]  Ortho Devices Type of Ortho Device: Darco shoe Ortho Device/Splint Location: LRE Ortho Device/Splint Interventions: Adjustment, Application, Ordered   Post Interventions Patient Tolerated: Well Instructions Provided: Care of device, Adjustment of device   [name] 09/17/2019, 3:54 PM

## 2019-09-17 NOTE — Progress Notes (Addendum)
[facility]  Subjective  - Feeling much better today.   Objective (!) 111/48 85 99.5 F (37.5 C) (Oral) 16 98%  Intake/Output Summary (Last 24 hours) at 09/17/2019 0713 Last data filed at 09/17/2019 0529 Gross per 24 hour  Intake 1969.49 ml  Output 900 ml  Net 1069.49 ml    Right amputation site with bloody drainage, clean dry dressing placed with ace compression. Leg incisions healing well, groin soft without hematoma. Palpable PT pulse right LE Lungs non labored breathing Heart RRR  Assessment/Planning: POD # 2 Procedure Performed: 1.Harvest right greater saphenous vein 2.Right common femoral to above-knee popliteal artery bypass PTFE graft 3.Amputation right toes 1-3  Plan to continue IV antibiotics until discharge 24-48 hours.  Prior to discharge will remove PICC line. Planning SNF placement near [place] weight bearing darco shoe right LE  Roxy Horseman 09/17/2019 7:13 AM --  Laboratory Lab Results: Recent Labs    09/15/19 1706 09/16/19 0846  WBC 9.9 8.4  HGB 6.2* 7.8*  HCT 19.0* 23.1*  PLT 124* 125*   BMET Recent Labs    09/15/19 1540 09/15/19 1706 09/16/19 0846  NA 141  --  139  K 4.4  --  4.1  CL 112*  --  110  CO2 21*  --  23  GLUCOSE 167*  --  125*  BUN 18  --  18  CREATININE 0.96 0.85 0.77  CALCIUM 7.9*  --  7.7*    COAG Lab Results  Component Value Date   INR 1.2 09/15/2019   INR 1.1 08/19/2019   INR 1.09 09/17/2015   No results found for: PTT   I have interviewed and examined patient with PA and agree with assessment and plan above.   Maanav Kassabian C. Donzetta Matters, MD [facility] Office: [phone] Pager: [phone]

## 2019-09-18 MED ORDER — OXYCODONE-ACETAMINOPHEN 5-325 MG PO TABS: 1.0000 | ORAL_TABLET | ORAL | 0 refills | Status: DC | PRN

## 2019-09-18 NOTE — Discharge Instructions (Signed)
 [facility]  Discharge instructions  Lower Extremity Bypass Surgery  Please refer to the following instruction for your post-procedure care. Your surgeon or physician assistant will discuss any changes with you.  Activity  You are encouraged to walk as much as you can. You can slowly return to normal activities during the month after your surgery. Avoid strenuous activity and heavy lifting until your doctor tells you it's OK. Avoid activities such as vacuuming or swinging a golf club. Do not drive until your doctor give the OK and you are no longer taking prescription pain medications. It is also normal to have difficulty with sleep habits, eating and bowel movement after surgery. These will go away with time.  Bathing/Showering  You may shower after you go home. Do not soak in a bathtub, hot tub, or swim until the incision heals completely.  Incision Care  Clean your incision with mild soap and water. Shower every day. Pat the area dry with a clean towel. You do not need a bandage unless otherwise instructed. Do not apply any ointments or creams to your incision. If you have open wounds you will be instructed how to care for them or a visiting nurse may be arranged for you. If you have staples or sutures along your incision they will be removed at your post-op appointment. You may have skin glue on your incision. Do not peel it off. It will come off on its own in about one week. If you have a great deal of moisture in your groin, use a gauze help keep this area dry.  Diet  Resume your normal diet. There are no special food restrictions following this procedure. A low fat/ low cholesterol diet is recommended for all patients with vascular disease. In order to heal from your surgery, it is CRITICAL to get adequate nutrition. Your body requires vitamins, minerals, and protein. Vegetables are the best source of vitamins and minerals. Vegetables also provide the  perfect balance of protein. Processed food has little nutritional value, so try to avoid this.  Medications  Resume taking all your medications unless your doctor or nurse practitioner tells you not to. If your incision is causing pain, you may take over-the-counter pain relievers such as acetaminophen (Tylenol). If you were prescribed a stronger pain medication, please aware these medication can cause nausea and constipation. Prevent nausea by taking the medication with a snack or meal. Avoid constipation by drinking plenty of fluids and eating foods with high amount of fiber, such as fruits, vegetables, and grains. Take Colase 100 mg (an over-the-counter stool softener) twice a day as needed for constipation. Do not take Tylenol if you are taking prescription pain medications.  Follow Up  Our office will schedule a follow up appointment 2-3 weeks following discharge.  Please call us immediately for any of the following conditions  Severe or worsening pain in your legs or feet while at rest or while walking Increase pain, redness, warmth, or drainage (pus) from your incision site(s) Fever of 101 degree or higher The swelling in your leg with the bypass suddenly worsens and becomes more painful than when you were in the hospital If you have been instructed to feel your graft pulse then you should do so every day. If you can no longer feel this pulse, call the office immediately. Not all patients are given this instruction.  Leg swelling is common after leg bypass surgery.  The swelling should improve over a few months   following surgery. To improve the swelling, you may elevate your legs above the level of your heart while you are sitting or resting. Your surgeon or physician assistant may ask you to apply an ACE wrap or wear compression (TED) stockings to help to reduce swelling.  Reduce your risk of vascular disease  Stop smoking. If you would like help call [facility] at 1-800-QUIT-NOW  (1-800-784-8669) or [place] at 336-586-4000.  Manage your cholesterol Maintain a desired weight Control your diabetes weight Control your diabetes Keep your blood pressure down  If you have any questions, please call the office at [phone]   

## 2019-09-18 NOTE — Discharge Summary (Addendum)
[facility] Discharge Summary   Patient ID:  Raymond Walker. MRN: [id number] DOB/AGE: 1951-07-10 68 y.o.  Admit date: 09/15/2019 Discharge date: 09/22/2019 Date of Surgery: 09/15/2019 Surgeon: Surgeon(s): Waynetta Sandy, MD  Admission Diagnosis: ischemic right toes  Discharge Diagnoses:  ischemic right toes  Secondary Diagnoses: Past Medical History:  Diagnosis Date  . COPD (chronic obstructive pulmonary disease) ([place])   . Erectile dysfunction   . History of kidney stones   . Hypertension   . Impaired glucose tolerance   . Pancreatitis, recurrent   . Thrombocytopenia ([place]) 08/10/2018   Staying in the low 100s will follow closely    Procedure(s): Right BYPASS GRAFT FEMORAL to Above Knee POPLITEAL ARTERY AMPUTATION RIGHT TOES One, Two, And Three  Discharged Condition: stable  HPI: This is a68 y.o.malehere with gangrenous changes right toes 1-3. Plan for right fem-pop bypass with vein vs graft and amputation of effected toes.  He has a PICC line and has been on antibiotics for bacteremia.    Hospital Course:  Vahan Shindler. is a 68 y.o. male is S/P  Procedure(s): Right BYPASS GRAFT FEMORAL to Above Knee POPLITEAL ARTERY AMPUTATION RIGHT TOES One, Two, And Three   Post operative course He had hypotension and received NS bolus x 2 with blood loss anemia of 450 cc he was transfused 2 units of PRBC's.  He is pending SNF placement recommended by PT/OT.  He lives by himself.  The right foot amputation site has nylon sutures which will need to be removed in 3-4 weeks.    Post op PT palpable pulse.   His WBC stable 8.4 and HGB 7.8 asymptomatic.  Ambulation with heel weightbearing in darco shoe.  PICC line discontinued prior to discharge, no antibiotics needed now that course has been completed.  He has reported burning and difficulty with urination.  + UTI will start a 10 day course of Cipro 500 mg BID.   PT recommended SNF placement secondary   Ambulation < 100 feet needing stand by assistance for rest breaks.  Unsteady gait, fatigues quickly and difficulty using darco shoe.     Significant Diagnostic Studies: CBC Lab Results  Component Value Date   WBC 8.4 09/16/2019   HGB 7.8 (L) 09/16/2019   HCT 23.1 (L) 09/16/2019   MCV 91.3 09/16/2019   PLT 125 (L) 09/16/2019    BMET    Component Value Date/Time   NA 139 09/16/2019 0846   NA 141 10/11/2015 1041   K 4.1 09/16/2019 0846   CL 110 09/16/2019 0846   CO2 23 09/16/2019 0846   GLUCOSE 125 (H) 09/16/2019 0846   BUN 18 09/16/2019 0846   BUN 11 10/11/2015 1041   CREATININE 0.77 09/16/2019 0846   CREATININE 0.70 08/01/2018 1126   CALCIUM 7.7 (L) 09/16/2019 0846   GFRNONAA >60 09/16/2019 0846   GFRNONAA 97 08/01/2018 1126   GFRAA >60 09/16/2019 0846   GFRAA 112 08/01/2018 1126   COAG Lab Results  Component Value Date   INR 1.2 09/15/2019   INR 1.1 08/19/2019   INR 1.09 09/17/2015     Disposition:  Discharge to :Skilled nursing facility Discharge Instructions    Call MD for:  redness, tenderness, or signs of infection (pain, swelling, bleeding, redness, odor or green/yellow discharge around incision site)   Complete by: As directed    Call MD for:  redness, tenderness, or signs of infection (pain, swelling, bleeding, redness, odor or green/yellow discharge around incision site)  Complete by: As directed    Call MD for:  severe or increased pain, loss or decreased feeling  in affected limb(s)   Complete by: As directed    Call MD for:  severe or increased pain, loss or decreased feeling  in affected limb(s)   Complete by: As directed    Call MD for:  temperature >100.5   Complete by: As directed    Call MD for:  temperature >100.5   Complete by: As directed    Resume previous diet   Complete by: As directed    Resume previous diet   Complete by: As directed      Allergies as of 09/22/2019   No Known Allergies     Medication List    STOP taking  these medications   ceFAZolin  IVPB Commonly known as: ANCEF     TAKE these medications   acetaminophen 500 MG tablet Commonly known as: TYLENOL Take 500 mg by mouth every 8 (eight) hours as needed for moderate pain.   aspirin 81 MG EC tablet Take 1 tablet (81 mg total) by mouth daily.   ciprofloxacin 500 MG tablet Commonly known as: Cipro Take 1 tablet (500 mg total) by mouth 2 (two) times daily.   clopidogrel 75 MG tablet Commonly known as: PLAVIX Take 1 tablet (75 mg total) by mouth daily with breakfast.   feeding supplement (ENSURE ENLIVE) Liqd Take 237 mLs by mouth 2 (two) times daily between meals.   nutrition supplement (JUVEN) Pack Take 1 packet by mouth 2 (two) times daily between meals.   multivitamin with minerals Tabs tablet Take 1 tablet by mouth daily.   nicotine 21 mg/24hr patch Commonly known as: NICODERM CQ - dosed in mg/24 hours Place 1 patch (21 mg total) onto the skin daily as needed (For nicotine withdrawal symptoms.).   oxyCODONE 5 MG immediate release tablet Commonly known as: Oxy IR/ROXICODONE Take 1 tablet (5 mg total) by mouth every 4 (four) hours as needed for moderate pain.   oxyCODONE-acetaminophen 5-325 MG tablet Commonly known as: PERCOCET/ROXICET Take 1 tablet by mouth every 4 (four) hours as needed for moderate pain.   polyethylene glycol 17 g packet Commonly known as: MIRALAX / GLYCOLAX Take 17 g by mouth daily.   rosuvastatin 20 MG tablet Commonly known as: CRESTOR Take 20 mg by mouth daily.   senna 8.6 MG Tabs tablet Commonly known as: SENOKOT Take 1 tablet (8.6 mg total) by mouth at bedtime.      Verbal and written Discharge instructions given to the patient. Wound care per Discharge AVS Follow-up Information    Waynetta Sandy, MD Follow up in 3 week(s).   Specialties: Vascular Surgery, Cardiology Why: office will call Contact information: [place] [address] [phone]            Signed: Roxy Horseman 09/22/2019, 1:50 PM - For VQI Registry use --- Instructions: Press F2 to tab through selections.  Delete question if not applicable.   Post-op:  Wound infection: No  Graft infection: No  Transfusion: Yes  If yes, 2 units given New Arrhythmia: No Ipsilateral amputation: [x ] no, [ ]  Minor, [ ]  BKA, [ ]  AKA Discharge patency: [x ] Primary, [ ]  Primary assisted, [ ]  Secondary, [ ]  Occluded Patency judged by: [ ]  Dopper only, [ ]  Palpable graft pulse, [x ] Palpable distal pulse, [ ]  ABI inc. > 0.15, [ ]  Duplex  D/C Ambulatory Status: Ambulatory with Assistance  Complications: MI: [x ] No, [ ]  Troponin only, [ ]  EKG or Clinical CHF: No Resp failure: [x ] none, [ ]  Pneumonia, [ ]  Ventilator Chg in renal function: [x ] none, [ ]  Inc. Cr > 0.5, [ ]  Temp. Dialysis, [ ]  Permanent dialysis Stroke: [x ] None, [ ]  Minor, [ ]  Major Return to OR: No  Reason for return to OR: [ ]  Bleeding, [ ]  Infection, [ ]  Thrombosis, [ ]  Revision  Discharge medications: Statin use:  Yes ASA use:  Yes Plavix use:  Yes Beta blocker use: Yes Coumadin use: No  for medical reason

## 2019-09-18 NOTE — Progress Notes (Addendum)
[facility]  Subjective  - Doing well without new surgical complaints.   Objective (!) 124/58 98 99.9 F (37.7 C) (Oral) 20 92%  Intake/Output Summary (Last 24 hours) at 09/18/2019 0732 Last data filed at 09/18/2019 0436 Gross per 24 hour  Intake 149.96 ml  Output 1050 ml  Net -900.04 ml    Right foot dressing clean and dry, did not change today to assist with compression after bleeding at incision site yesterday.   PT brisk signal right LE Incisions healing well, right groin soft Lungs non labored breathing  Assessment/Planning: POD # 3  Procedure Performed: 1.Harvest right greater saphenous vein 2.Right common femoral to above-knee popliteal artery bypass PTFE graft 3.Amputation right toes 1-3  Plan to continue IV antibiotics until discharge.  Prior to discharge will remove PICC line.  No need for additional antibiotics after discharge since the amputation.   Planning SNF placement near [place] weight bearing darco shoe right LE F/U in our office with ABI's in 3-4  weeks and removal of sutures.  Roxy Horseman 09/18/2019 7:32 AM --  Laboratory Lab Results: Recent Labs    09/15/19 1706 09/16/19 0846  WBC 9.9 8.4  HGB 6.2* 7.8*  HCT 19.0* 23.1*  PLT 124* 125*   BMET Recent Labs    09/15/19 1540 09/15/19 1706 09/16/19 0846  NA 141  --  139  K 4.4  --  4.1  CL 112*  --  110  CO2 21*  --  23  GLUCOSE 167*  --  125*  BUN 18  --  18  CREATININE 0.96 0.85 0.77  CALCIUM 7.9*  --  7.7*    COAG Lab Results  Component Value Date   INR 1.2 09/15/2019   INR 1.1 08/19/2019   INR 1.09 09/17/2015   No results found for: PTT   I have independently interviewed and examined patient and agree with PA assessment and plan above.   Soha Thorup C. Donzetta Matters, MD [facility] Office: [phone] Pager: [phone]

## 2019-09-18 NOTE — Care Management Important Message (Signed)
Important Message  Patient Details  Name: Raymond Walker. MRN: [id number] Date of Birth: [birth year]   Medicare Important Message Given:  Yes     Amma Crear 09/18/2019, 3:10 PM

## 2019-09-18 NOTE — Plan of Care (Signed)
  Problem: Health Behavior/Discharge Planning: Goal: Ability to manage health-related needs will improve Outcome: Progressing   Problem: Clinical Measurements: Goal: Will remain free from infection Outcome: Progressing   

## 2019-09-18 NOTE — NC FL2 (Signed)
[place] LEVEL OF CARE SCREENING TOOL     IDENTIFICATION  Patient Name: Raymond Walker. Birthdate: 1951/08/10 Sex: male Admission Date (Current Location): 09/15/2019  [facility] and [place] Number:  Herbalist and Address:  [facility], [place] [address] [place], [place] [address]      Provider Number: [id number]  Attending Physician Name and Address:  Cain, Hazel Name and Phone Number:       Current Level of Care: Hospital Recommended Level of Care: [place] Prior Approval Number:    Date Approved/Denied:   PASRR Number: [id number]  Discharge Plan: SNF    Current Diagnoses: Patient Active Problem List   Diagnosis Date Noted  . PAD (peripheral artery disease) ([place]) 09/15/2019  . Protein-calorie malnutrition, severe 08/21/2019  . MSSA bacteremia 08/21/2019  . Acute osteomyelitis of toe of right foot ([place])   . Cellulitis of right lower extremity   . 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[place]) 08/19/2019  . Impaired glucose tolerance   . COPD (chronic obstructive pulmonary disease) ([place])   . Thrombocytopenia ([employer]) 08/10/2018  . Aortic atherosclerosis ([place]) 10/14/2016  . Coronary artery calcification seen on CAT scan 10/14/2016  . Other emphysema ([place]) 10/14/2016  . Elevated blood pressure 12/03/2011  . Chest pain 12/03/2011  . Tobacco abuse 12/03/2011  . Right leg pain 12/03/2011    Orientation RESPIRATION BLADDER Height & Weight     Self, Time, Situation, Place  Normal Continent, External catheter Weight: 145 lb (65.8 kg) Height:  6' (182.9 cm)  BEHAVIORAL SYMPTOMS/MOOD NEUROLOGICAL BOWEL NUTRITION STATUS      Continent Diet(Please see DC Summary)  AMBULATORY STATUS COMMUNICATION OF NEEDS Skin   Extensive Assist Verbally Surgical wounds                       Personal Care Assistance Level of Assistance  Bathing, Feeding, Dressing Bathing Assistance: Limited  assistance Feeding assistance: Independent Dressing Assistance: Limited assistance     Functional Limitations Info  Sight, Speech, Hearing Sight Info: Impaired Hearing Info: Impaired Speech Info: Adequate    SPECIAL CARE FACTORS FREQUENCY  PT (By licensed PT), OT (By licensed OT)     PT Frequency: 5x OT Frequency: 5x            Contractures Contractures Info: Not present    Additional Factors Info  Code Status, Allergies Code Status Info: Full Allergies Info: NKA           Current Medications (09/18/2019):  This is the current hospital active medication list Current Facility-Administered Medications  Medication Dose Route Frequency Provider Last Rate Last Dose  . 0.9 %  sodium chloride infusion (Manually program via Guardrails IV Fluids)   Intravenous Once Waynetta Sandy, MD      . 0.9 %  sodium chloride infusion   Intravenous Continuous Ulyses Amor, PA-C   Stopped at 09/16/19 1537  . acetaminophen (TYLENOL) tablet 500 mg  500 mg Oral Q8H PRN Ulyses Amor, PA-C   500 mg at 09/17/19 0009  . alum & mag hydroxide-simeth (MAALOX/MYLANTA) 200-200-20 MG/5ML suspension 15-30 mL  15-30 mL Oral Q2H PRN Laurence Slate M, PA-C      . aspirin EC tablet 81 mg  81 mg Oral Daily Waynetta Sandy, MD   81 mg at 09/18/19 0950  . bisacodyl (DULCOLAX) EC tablet 5 mg  5 mg Oral Daily PRN Ulyses Amor, PA-C      .  Chlorhexidine Gluconate Cloth 2 % PADS 6 each  6 each Topical Daily Waynetta Sandy, MD   6 each at 09/15/19 1630  . clopidogrel (PLAVIX) tablet 75 mg  75 mg Oral Q breakfast Laurence Slate M, PA-C   75 mg at 09/18/19 0950  . docusate sodium (COLACE) capsule 100 mg  100 mg Oral Daily Laurence Slate M, PA-C      . feeding supplement (ENSURE ENLIVE) (ENSURE ENLIVE) liquid 237 mL  237 mL Oral BID BM Laurence Slate M, PA-C   237 mL at 09/16/19 1543  . guaiFENesin-dextromethorphan (ROBITUSSIN DM) 100-10 MG/5ML syrup 15 mL  15 mL Oral Q4H PRN Laurence Slate M, PA-C      . heparin injection 5,000 Units  5,000 Units Subcutaneous Q8H Laurence Slate M, PA-C   5,000 Units at 09/16/19 1535  . hydrALAZINE (APRESOLINE) injection 5 mg  5 mg Intravenous Q20 Min PRN Laurence Slate M, PA-C      . HYDROmorphone (DILAUDID) injection 0.5-1 mg  0.5-1 mg Intravenous Q2H PRN Laurence Slate M, PA-C   1 mg at 09/16/19 0101  . labetalol (NORMODYNE) injection 10 mg  10 mg Intravenous Q10 min PRN Laurence Slate M, PA-C      . magnesium sulfate IVPB 2 g 50 mL  2 g Intravenous Daily PRN Laurence Slate M, PA-C      . metoprolol tartrate (LOPRESSOR) injection 2-5 mg  2-5 mg Intravenous Q2H PRN Laurence Slate M, PA-C      . multivitamin with minerals tablet 1 tablet  1 tablet Oral Daily Laurence Slate M, PA-C   1 tablet at 09/18/19 0950  . nicotine (NICODERM CQ - dosed in mg/24 hours) patch 21 mg  21 mg Transdermal Daily PRN Laurence Slate M, PA-C   21 mg at 09/18/19 1233  . nutrition supplement (JUVEN) (JUVEN) powder packet 1 packet  1 packet Oral BID BM Ulyses Amor, PA-C   1 packet at 09/16/19 1544  . ondansetron (ZOFRAN) injection 4 mg  4 mg Intravenous Q6H PRN Laurence Slate M, PA-C      . oxyCODONE (Oxy IR/ROXICODONE) immediate release tablet 5 mg  5 mg Oral Q4H PRN Laurence Slate M, PA-C      . oxyCODONE-acetaminophen (PERCOCET/ROXICET) 5-325 MG per tablet 1-2 tablet  1-2 tablet Oral Q4H PRN Ulyses Amor, PA-C   2 tablet at 09/17/19 1534  . pantoprazole (PROTONIX) EC tablet 40 mg  40 mg Oral Daily Laurence Slate M, PA-C   40 mg at 09/18/19 0950  . phenol (CHLORASEPTIC) mouth spray 1 spray  1 spray Mouth/Throat PRN Laurence Slate M, PA-C      . polyethylene glycol (MIRALAX / GLYCOLAX) packet 17 g  17 g Oral Daily Laurence Slate M, PA-C      . potassium chloride SA (KLOR-CON) CR tablet 20-40 mEq  20-40 mEq Oral Daily PRN Laurence Slate M, PA-C      . rosuvastatin (CRESTOR) tablet 20 mg  20 mg Oral Daily Laurence Slate M, PA-C   20 mg at 09/18/19 0950  . senna (SENOKOT) tablet  8.6 mg  1 tablet Oral QHS Collins, Emma M, PA-C      . senna-docusate (Senokot-S) tablet 1 tablet  1 tablet Oral QHS PRN Ulyses Amor, PA-C         Discharge Medications: Please see discharge summary for a list of discharge medications.  Relevant Imaging Results:  Relevant Lab Results:   Additional Information SSN [id number] [phone]  COVID negative on 9/29  Benard Halsted, LCSW

## 2019-09-18 NOTE — TOC Initial Note (Signed)
Transition of Care (TOC) - Initial/Assessment Note    Patient Details  Name: Raymond Walker. MRN: [id number] Date of Birth: 1951/11/14  Transition of Care [facility]) CM/SW Contact:    Vinie Sill, [place] Phone Number: 09/18/2019, 6:23 PM  Clinical Narrative:                   Expected Discharge Plan: [place]   CSW visit with the patient at bedside. Patient was alert and oriented. Patient states he lives home alone but he was at [facility] rehab prior to admission. CSW called and confirmed patient is from [facility]. Patient states he likes [facility] but wanted to try and get a SNF in the [place] area. CSW called [facility] in [place] while in the room with the patient, they had no availability. Patient requested to contact [facility] in [place] left voice message with Mardene Celeste.   Patient decided he was willing to return to [facility]. Patient needs insurance authorization. Clinicals were faxed to [facility] today to start the authorization process. Patient will need a COVID test prior to d/c to SNF.   [facility] SNF was updated.  CSW will continue to follow.  Thurmond Butts, MSW, [facility] Clinical Social Worker [phone]    Patient Goals and CMS Choice     Choice offered to / list presented to : Patient  Expected Discharge Plan and Services Expected Discharge Plan: [place]       Living arrangements for the past 2 months: Single Family Home Expected Discharge Date: 09/18/19                                    Prior Living Arrangements/Services Living arrangements for the past 2 months: Single Family Home Lives with:: Self Patient language and need for interpreter reviewed:: Yes Do you feel safe going back to the place where you live?: No   agrees to SNF  Need for Family Participation in Patient Care: Yes (Comment) Care giver support system in place?: Yes (comment)   Criminal Activity/Legal Involvement  Pertinent to Current Situation/Hospitalization: No - Comment as needed  Activities of Daily Living Home Assistive Devices/Equipment: Dentures (specify type), Cane (specify quad or straight) ADL Screening (condition at time of admission) Patient's cognitive ability adequate to safely complete daily activities?: Yes Is the patient deaf or have difficulty hearing?: No Does the patient have difficulty seeing, even when wearing glasses/contacts?: No Does the patient have difficulty concentrating, remembering, or making decisions?: No Patient able to express need for assistance with ADLs?: Yes Does the patient have difficulty dressing or bathing?: No Independently performs ADLs?: Yes (appropriate for developmental age) Does the patient have difficulty walking or climbing stairs?: Yes Weakness of Legs: Both Weakness of Arms/Hands: None  Permission Sought/Granted Permission sought to share information with : Family Supports Permission granted to share information with : Yes, Verbal Permission Granted  Share Information with NAME: Nurse, children's  Permission granted to share info w AGENCY: SNFs  Permission granted to share info w Relationship: niece  Permission granted to share info w Contact Information: [phone]  Emotional Assessment Appearance:: Appears stated age Attitude/Demeanor/Rapport: Engaged Affect (typically observed): Accepting, Appropriate Orientation: : Oriented to Self, Oriented to Place, Oriented to  Time, Oriented to Situation Alcohol / Substance Use: Not Applicable Psych Involvement: No (comment)  Admission diagnosis:  ischemic right toes Patient Active  Problem List   Diagnosis Date Noted  . PAD (peripheral artery disease) ([place]) 09/15/2019  . Protein-calorie malnutrition, severe 08/21/2019  . MSSA bacteremia 08/21/2019  . Acute osteomyelitis of toe of right foot ([place])   . Cellulitis of right lower extremity   . 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[place])  08/19/2019  . Impaired glucose tolerance   . COPD (chronic obstructive pulmonary disease) ([place])   . Thrombocytopenia ([place]) 08/10/2018  . Aortic atherosclerosis ([place]) 10/14/2016  . Coronary artery calcification seen on CAT scan 10/14/2016  . Other emphysema ([place]) 10/14/2016  . Elevated blood pressure 12/03/2011  . Chest pain 12/03/2011  . Tobacco abuse 12/03/2011  . Right leg pain 12/03/2011   PCP:  [name], MD Pharmacy:  No Pharmacies Listed    Social Determinants of Health (SDOH) Interventions    Readmission Risk Interventions No flowsheet data found.

## 2019-09-19 LAB — GLUCOSE, CAPILLARY
Glucose-Capillary: 111 mg/dL — ABNORMAL HIGH (ref 70–99)
Glucose-Capillary: 159 mg/dL — ABNORMAL HIGH (ref 70–99)

## 2019-09-19 MED ORDER — HYDROMORPHONE HCL 1 MG/ML IJ SOLN: 0.5000 mg | INTRAMUSCULAR | Status: DC | PRN

## 2019-09-19 NOTE — Plan of Care (Signed)
  Problem: Coping: Goal: Level of anxiety will decrease Outcome: Progressing   Problem: Pain Managment: Goal: General experience of comfort will improve Outcome: Progressing   

## 2019-09-19 NOTE — Progress Notes (Addendum)
  Progress Note    09/19/2019 8:34 AM 4 Days Post-Op  Subjective:  No new complaints   Vitals:   09/19/19 0041 09/19/19 0404  BP: (!) 109/48 (!) 121/59  Pulse: 81 72  Resp: 18 17  Temp: 97.7 F (36.5 C) 98.3 F (36.8 C)  SpO2: 100% 99%   Physical Exam Lungs:  Non labored Incisions:  R groin c/d/i; incisions of RLE healing well; R foot incision c/d/i with viable skin edges; R PTA by doppler Abdomen:  soft Neurologic: A&O  CBC    Component Value Date/Time   WBC 8.4 09/16/2019 0846   RBC 2.53 (L) 09/16/2019 0846   HGB 7.8 (L) 09/16/2019 0846   HCT 23.1 (L) 09/16/2019 0846   PLT 125 (L) 09/16/2019 0846   MCV 91.3 09/16/2019 0846   MCH 30.8 09/16/2019 0846   MCHC 33.8 09/16/2019 0846   RDW 14.6 09/16/2019 0846   LYMPHSABS 2.0 08/29/2019 0405   MONOABS 1.0 08/29/2019 0405   EOSABS 0.2 08/29/2019 0405   BASOSABS 0.1 08/29/2019 0405    BMET    Component Value Date/Time   NA 139 09/16/2019 0846   NA 141 10/11/2015 1041   K 4.1 09/16/2019 0846   CL 110 09/16/2019 0846   CO2 23 09/16/2019 0846   GLUCOSE 125 (H) 09/16/2019 0846   BUN 18 09/16/2019 0846   BUN 11 10/11/2015 1041   CREATININE 0.77 09/16/2019 0846   CREATININE 0.70 08/01/2018 1126   CALCIUM 7.7 (L) 09/16/2019 0846   GFRNONAA >60 09/16/2019 0846   GFRNONAA 97 08/01/2018 1126   GFRAA >60 09/16/2019 0846   GFRAA 112 08/01/2018 1126    INR    Component Value Date/Time   INR 1.2 09/15/2019 0652     Intake/Output Summary (Last 24 hours) at 09/19/2019 0834 Last data filed at 09/18/2019 1757 Gross per 24 hour  Intake 480 ml  Output 600 ml  Net -120 ml     Assessment/Plan:  68 y.o. male is s/p R femoral to AK pop bypass with toe amps 4 Days Post-Op   Perfusing RLE well with peripheral doppler signals Dressing changed R foot; must ambulate with darco shoe SNF when approved by insurance   Dagoberto Ligas, PA-C [facility] [phone] 09/19/2019 8:34 AM  I have seen and  evaluated the patient. I agree with the PA note as documented above. S/P Right CFA to AK pop bypass. Foot looks good after dressing change.  Incisions including groin ok.  Awaiting SNF.  OOB to chair.  Brisk PT signal.  Marty Heck, MD [facility] [place] Office: [phone] Pager: [phone]

## 2019-09-20 LAB — GLUCOSE, CAPILLARY: Glucose-Capillary: 106 mg/dL — ABNORMAL HIGH (ref 70–99)

## 2019-09-20 NOTE — Plan of Care (Signed)
  Problem: Clinical Measurements: Goal: Will remain free from infection Outcome: Progressing Goal: Cardiovascular complication will be avoided Outcome: Progressing   Problem: Pain Managment: Goal: General experience of comfort will improve Outcome: Progressing   

## 2019-09-20 NOTE — Progress Notes (Addendum)
  Progress Note    09/20/2019 8:15 AM 5 Days Post-Op  Subjective:  No new complaints related to RLE.  Now interested in d/c home with [facility] instead of SNF   Vitals:   09/20/19 0306 09/20/19 0739  BP: 124/83 104/67  Pulse: 75 (!) 139  Resp: 17 16  Temp: (!) 97.5 F (36.4 C) 98.4 F (36.9 C)  SpO2: 100% 100%   Physical Exam: Lungs:  Non labored Incisions:  R groin and RLE incisions c/d/i; R foot incision with some erythema however no frank infection, skin edges viable Extremities:  AT and PT RLE by doppler Neurologic: A&O  CBC    Component Value Date/Time   WBC 8.4 09/16/2019 0846   RBC 2.53 (L) 09/16/2019 0846   HGB 7.8 (L) 09/16/2019 0846   HCT 23.1 (L) 09/16/2019 0846   PLT 125 (L) 09/16/2019 0846   MCV 91.3 09/16/2019 0846   MCH 30.8 09/16/2019 0846   MCHC 33.8 09/16/2019 0846   RDW 14.6 09/16/2019 0846   LYMPHSABS 2.0 08/29/2019 0405   MONOABS 1.0 08/29/2019 0405   EOSABS 0.2 08/29/2019 0405   BASOSABS 0.1 08/29/2019 0405    BMET    Component Value Date/Time   NA 139 09/16/2019 0846   NA 141 10/11/2015 1041   K 4.1 09/16/2019 0846   CL 110 09/16/2019 0846   CO2 23 09/16/2019 0846   GLUCOSE 125 (H) 09/16/2019 0846   BUN 18 09/16/2019 0846   BUN 11 10/11/2015 1041   CREATININE 0.77 09/16/2019 0846   CREATININE 0.70 08/01/2018 1126   CALCIUM 7.7 (L) 09/16/2019 0846   GFRNONAA >60 09/16/2019 0846   GFRNONAA 97 08/01/2018 1126   GFRAA >60 09/16/2019 0846   GFRAA 112 08/01/2018 1126    INR    Component Value Date/Time   INR 1.2 09/15/2019 0652     Intake/Output Summary (Last 24 hours) at 09/20/2019 0815 Last data filed at 09/19/2019 2108 Gross per 24 hour  Intake 600 ml  Output 760 ml  Net -160 ml     Assessment/Plan:  68 y.o. male is s/p R femoral to AK pop bypass with toe amps 5 Days Post-Op   Perfusing RLE well; PT and AT by doppler Patient now interested in d/c home with [facility]; I asked PT if they can re-evaluate today Dispo: pending  re-eval by PT   Dagoberto Ligas, PA-C [facility] [phone] 09/20/2019 8:15 AM  I have seen and evaluated the patient. I agree with the PA note as documented above. S/P R fem AK pop bypass.  Incisions look good.  Good AT/PT signals in RLE.  Patient states he wants to go home now instead of SNF.  Will have PT re-evaluate.    Marty Heck, MD [facility] [place] Office: [phone] Pager: [phone]

## 2019-09-21 LAB — SARS CORONAVIRUS 2 (TAT 6-24 HRS): SARS Coronavirus 2: NEGATIVE

## 2019-09-21 NOTE — TOC Progression Note (Signed)
Transition of Care (TOC) - Progression Note    Patient Details  Name: Raymond Walker. MRN: [id number] Date of Birth: 04-24-1951  Transition of Care [facility]) CM/SW [place], [place] Phone Number: 09/21/2019, 11:52 AM  Clinical Narrative:     CSW called [facility]- patient's insurance remains pending- RN updated and informed COVID test is needed before discharge to SNF.  Thurmond Butts, MSW, [facility] Clinical Social Worker [phone]   Expected Discharge Plan: Skilled Nursing Facility Barriers to Discharge: No Barriers Identified, Barriers Resolved  Expected Discharge Plan and Services Expected Discharge Plan: [place] arrangements for the past 2 months: Single Family Home Expected Discharge Date: 09/21/19                                     Social Determinants of Health (SDOH) Interventions    Readmission Risk Interventions No flowsheet data found.

## 2019-09-21 NOTE — Progress Notes (Addendum)
[facility] [place]  Subjective  - He wants to walk and wants to go home. Waiting on PT re-eval.     Objective 129/71 80 98.2 F (36.8 C) (Oral) 17 100%  Intake/Output Summary (Last 24 hours) at 09/21/2019 0719 Last data filed at 09/21/2019 0512 Gross per 24 hour  Intake 640 ml  Output 1100 ml  Net -460 ml    Palpable AT/PT pulse, forefoot amputation site healing well, all leg incisions healing well. Groin soft Lungs non labored breathing Gen NAD  Assessment/Planning: POD # 6 68 y.o. male is s/p R femoral to AK pop bypass with toe amps  Pending PT re-eval and plan for home with [facility] or SNF.  No equipment needs. F/U in office with DR. Symphanie Cederberg in 2 weeks with ABI's  Roxy Horseman 09/21/2019 7:19 AM --  Laboratory Lab Results: No results for input(s): WBC, HGB, HCT, PLT in the last 72 hours. BMET No results for input(s): NA, K, CL, CO2, GLUCOSE, BUN, CREATININE, CALCIUM in the last 72 hours.  COAG Lab Results  Component Value Date   INR 1.2 09/15/2019   INR 1.1 08/19/2019   INR 1.09 09/17/2015   No results found for: PTT   I have independently interviewed and examined patient and agree with PA assessment and plan above. Pending SNF  Reinhardt Licausi C. Donzetta Matters, MD [facility] [place] Office: [phone] Pager: [phone]

## 2019-09-21 NOTE — Progress Notes (Addendum)
Spoke with pt at bedside regarding transition plan Home with [facility] vs [facility] for rehab. Per pt he wants to do what is recommended as the safest for him. Explained to pt. That per PT eval and recommendations it is felt that the safest plan is for him to go to rehab prior to returning home. Pt is agreeable to this and is open to returning to [facility] or to any facility that has a bed available. CSW to follow up on [facility] authorization. Pt will need COVID test prior to transition to SNF.

## 2019-09-21 NOTE — Consult Note (Signed)
[facility] CM Inpatient Consult   09/21/2019  Raymond Walker 1951/06/04 [id number]  Patient screened for re-hospitalizations to check if potential [facility] services are needed.  Review of patient's medical record reveals patient is for a skilled nursing facility.   Plan:  No follow up needed at this time as his needs will be met at a skilled facility. If patient returns home and needs are not met:  Please place a [facility] Care Management consult as appropriate and for questions contact:   Natividad Brood, RN BSN [facility] Liaison  [phone] business mobile phone Toll free office [phone]  Fax number: [phone] [email or web address]

## 2019-09-21 NOTE — TOC Progression Note (Signed)
Transition of Care (TOC) - Progression Note    Patient Details  Name: Raymond Walker. MRN: [id number] Date of Birth: [birth year]  Transition of Care [facility]) CM/SW [place], [place] Phone Number: 09/21/2019, 1:51 PM  Clinical Narrative:     Received insurance authorization for SNF - [id number] reference number [id number].   Patient needs COVID before discharging to SNF.  Thurmond Butts, MSW, [facility] Clinical Social Worker [phone]   Expected Discharge Plan: Skilled Nursing Facility Barriers to Discharge: No Barriers Identified, Barriers Resolved  Expected Discharge Plan and Services Expected Discharge Plan: [place] arrangements for the past 2 months: Single Family Home Expected Discharge Date: 09/21/19                                     Social Determinants of Health (SDOH) Interventions    Readmission Risk Interventions No flowsheet data found.

## 2019-09-21 NOTE — Progress Notes (Signed)
Physical Therapy Treatment Patient Details Name: Raymond Walker. 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 PMH including but not limited to COPD and current smoker.    PT Comments    Patient progressing slowly towards PT goals. Tolerated gait training with Min guard assist for balance/safety with use of RW and darco shoe. Requires cues to adhere to WB through heel during mobility. Pt fatigues very quickly requiring standing and seated rest breaks. Slight drop in BP with mobility but no dizziness. Encouraged sitting in chair for meals during the day but pt denies. Continue to recommend SNF as pt lives alone and is a high fall risk. Will continue to follow and progress as tolerated.    Follow Up Recommendations  SNF     Equipment Recommendations  None recommended by PT    Recommendations for Other Services       Precautions / Restrictions Precautions Precautions: Fall Required Braces or Orthoses: Other Brace Other Brace: darco shoe Restrictions Weight Bearing Restrictions: Yes Other Position/Activity Restrictions: through heel with DARCO shoe    Mobility  Bed Mobility Overal bed mobility: Needs Assistance Bed Mobility: Supine to Sit     Supine to sit: Supervision;HOB elevated     General bed mobility comments: with HOB 20 degrees and rail used. no physical assistance needed. Impulsive to stand.  Transfers Overall transfer level: Needs assistance Equipment used: Rolling walker (2 wheeled);None Transfers: Sit to/from Stand Sit to Stand: Min guard         General transfer comment: Min guard for safety, Impulsive to stand, at first stood without darco shoe and without RW. Stood from [employer], from chair x1.  Ambulation/Gait Ambulation/Gait assistance: Min guard Gait Distance (Feet): 12 Feet(x2 bouts) Assistive device: Rolling walker (2 wheeled) Gait Pattern/deviations:  Step-to pattern;Step-through pattern;Trunk flexed;Decreased stance time - right Gait velocity: decreased   General Gait Details: Slow, mildly unsteady gait with cues to maintain WB through heel. Difficulty using darco shoe, "it is so slick" + fatigue quickly needing to sit. 1 standing rest break as well.   Stairs             Wheelchair Mobility    Modified Rankin (Stroke Patients Only)       Balance Overall balance assessment: Needs assistance Sitting-balance support: Feet supported;No upper extremity supported Sitting balance-Leahy Scale: Good Sitting balance - Comments: Able to reach outside BoS and doff darco shoe with increased time.   Standing balance support: During functional activity Standing balance-Leahy Scale: Poor Standing balance comment: heavy reliance on bilateral UEs                            Cognition Arousal/Alertness: Awake/alert Behavior During Therapy: WFL for tasks assessed/performed Overall Cognitive Status: Within Functional Limits for tasks assessed                                 General Comments: not really cognitive deficits, but low education, needs very simple terms/explanations      Exercises General Exercises - Lower Extremity Ankle Circles/Pumps: Both;10 reps;Supine    General Comments General comments (skin integrity, edema, etc.): Pre activity BP 117/62, post activity BP 107/63. HR 90-112 bpm.      Pertinent Vitals/Pain Pain Assessment: No/denies pain    Home Living  Prior Function            PT Goals (current goals can now be found in the care plan section) Progress towards PT goals: Progressing toward goals    Frequency    Min 3X/week      PT Plan Current plan remains appropriate    Co-evaluation              AM-PAC PT "6 Clicks" Mobility   Outcome Measure  Help needed turning from your back to your side while in a flat bed without using  bedrails?: A Little Help needed moving from lying on your back to sitting on the side of a flat bed without using bedrails?: A Little Help needed moving to and from a bed to a chair (including a wheelchair)?: A Little Help needed standing up from a chair using your arms (e.g., wheelchair or bedside chair)?: A Little Help needed to walk in hospital room?: A Little Help needed climbing 3-5 steps with a railing? : A Lot 6 Click Score: 17    End of Session Equipment Utilized During Treatment: Gait belt;Other (comment)(Darco shoe) Activity Tolerance: Patient tolerated treatment well;Patient limited by fatigue Patient left: with call bell/phone within reach;in bed Nurse Communication: Mobility status PT Visit Diagnosis: Other abnormalities of gait and mobility (R26.89)     Time: [id number] PT Time Calculation (min) (ACUTE ONLY): 22 min  Charges:  $Gait Training: 8-22 mins                     Wray Kearns, PT, DPT Acute Rehabilitation Services Pager [phone] Office [phone]       [place] 09/21/2019, 8:59 AM

## 2019-09-22 DIAGNOSIS — M6281 Muscle weakness (generalized): Secondary | ICD-10-CM | POA: Diagnosis not present

## 2019-09-22 DIAGNOSIS — S98211D Complete traumatic amputation of two or more right lesser toes, subsequent encounter: Secondary | ICD-10-CM | POA: Diagnosis not present

## 2019-09-22 DIAGNOSIS — K5900 Constipation, unspecified: Secondary | ICD-10-CM | POA: Diagnosis not present

## 2019-09-22 DIAGNOSIS — E11621 Type 2 diabetes mellitus with foot ulcer: Secondary | ICD-10-CM | POA: Diagnosis not present

## 2019-09-22 DIAGNOSIS — N200 Calculus of kidney: Secondary | ICD-10-CM | POA: Diagnosis not present

## 2019-09-22 DIAGNOSIS — I6529 Occlusion and stenosis of unspecified carotid artery: Secondary | ICD-10-CM | POA: Diagnosis not present

## 2019-09-22 DIAGNOSIS — Z720 Tobacco use: Secondary | ICD-10-CM | POA: Diagnosis not present

## 2019-09-22 DIAGNOSIS — J449 Chronic obstructive pulmonary disease, unspecified: Secondary | ICD-10-CM | POA: Diagnosis not present

## 2019-09-22 DIAGNOSIS — N390 Urinary tract infection, site not specified: Secondary | ICD-10-CM | POA: Diagnosis not present

## 2019-09-22 DIAGNOSIS — L89153 Pressure ulcer of sacral region, stage 3: Secondary | ICD-10-CM | POA: Diagnosis not present

## 2019-09-22 DIAGNOSIS — E785 Hyperlipidemia, unspecified: Secondary | ICD-10-CM | POA: Diagnosis not present

## 2019-09-22 DIAGNOSIS — L97509 Non-pressure chronic ulcer of other part of unspecified foot with unspecified severity: Secondary | ICD-10-CM | POA: Diagnosis not present

## 2019-09-22 DIAGNOSIS — N451 Epididymitis: Secondary | ICD-10-CM | POA: Diagnosis not present

## 2019-09-22 DIAGNOSIS — A4901 Methicillin susceptible Staphylococcus aureus infection, unspecified site: Secondary | ICD-10-CM | POA: Diagnosis not present

## 2019-09-22 DIAGNOSIS — R279 Unspecified lack of coordination: Secondary | ICD-10-CM | POA: Diagnosis not present

## 2019-09-22 DIAGNOSIS — L03115 Cellulitis of right lower limb: Secondary | ICD-10-CM | POA: Diagnosis not present

## 2019-09-22 DIAGNOSIS — I739 Peripheral vascular disease, unspecified: Secondary | ICD-10-CM | POA: Diagnosis not present

## 2019-09-22 DIAGNOSIS — R7302 Impaired glucose tolerance (oral): Secondary | ICD-10-CM | POA: Diagnosis not present

## 2019-09-22 DIAGNOSIS — Z743 Need for continuous supervision: Secondary | ICD-10-CM | POA: Diagnosis not present

## 2019-09-22 DIAGNOSIS — R5381 Other malaise: Secondary | ICD-10-CM | POA: Diagnosis not present

## 2019-09-22 DIAGNOSIS — I2510 Atherosclerotic heart disease of native coronary artery without angina pectoris: Secondary | ICD-10-CM | POA: Diagnosis not present

## 2019-09-22 DIAGNOSIS — M86171 Other acute osteomyelitis, right ankle and foot: Secondary | ICD-10-CM | POA: Diagnosis not present

## 2019-09-22 DIAGNOSIS — Z8631 Personal history of diabetic foot ulcer: Secondary | ICD-10-CM | POA: Diagnosis not present

## 2019-09-22 DIAGNOSIS — R52 Pain, unspecified: Secondary | ICD-10-CM | POA: Diagnosis not present

## 2019-09-22 DIAGNOSIS — B3749 Other urogenital candidiasis: Secondary | ICD-10-CM | POA: Diagnosis not present

## 2019-09-22 DIAGNOSIS — I10 Essential (primary) hypertension: Secondary | ICD-10-CM | POA: Diagnosis not present

## 2019-09-22 DIAGNOSIS — Z951 Presence of aortocoronary bypass graft: Secondary | ICD-10-CM | POA: Diagnosis not present

## 2019-09-22 DIAGNOSIS — S98131A Complete traumatic amputation of one right lesser toe, initial encounter: Secondary | ICD-10-CM | POA: Diagnosis not present

## 2019-09-22 DIAGNOSIS — S98111D Complete traumatic amputation of right great toe, subsequent encounter: Secondary | ICD-10-CM | POA: Diagnosis not present

## 2019-09-22 DIAGNOSIS — R300 Dysuria: Secondary | ICD-10-CM | POA: Diagnosis not present

## 2019-09-22 DIAGNOSIS — L89150 Pressure ulcer of sacral region, unstageable: Secondary | ICD-10-CM | POA: Diagnosis not present

## 2019-09-22 DIAGNOSIS — R2689 Other abnormalities of gait and mobility: Secondary | ICD-10-CM | POA: Diagnosis not present

## 2019-09-22 DIAGNOSIS — E43 Unspecified severe protein-calorie malnutrition: Secondary | ICD-10-CM | POA: Diagnosis not present

## 2019-09-22 DIAGNOSIS — G4700 Insomnia, unspecified: Secondary | ICD-10-CM | POA: Diagnosis not present

## 2019-09-22 DIAGNOSIS — L97429 Non-pressure chronic ulcer of left heel and midfoot with unspecified severity: Secondary | ICD-10-CM | POA: Diagnosis not present

## 2019-09-22 DIAGNOSIS — R319 Hematuria, unspecified: Secondary | ICD-10-CM | POA: Diagnosis not present

## 2019-09-22 MED ORDER — CIPROFLOXACIN HCL 500 MG PO TABS: 500.0000 mg | ORAL_TABLET | Freq: Two times a day (BID) | ORAL | 0 refills | Status: DC

## 2019-09-22 NOTE — Progress Notes (Signed)
Pt being discharged to facility. Midline and IV removed and intact. Vitals stable. Pt denies complaints. Telebox 03 removed. Report called to facility. Will await PTAR for transport.  Jerald Kief, RN

## 2019-09-22 NOTE — Progress Notes (Addendum)
     Subjective  - Doing OK except for voiding difficulties and burning pain with voiding.   Objective 137/66 74 99.6 F (37.6 C) (Oral) 15 95%  Intake/Output Summary (Last 24 hours) at 09/22/2019 0735 Last data filed at 09/21/2019 2026 Gross per 24 hour  Intake 240 ml  Output 500 ml  Net -260 ml   Right foot amputation site healing well without erythema or openings in the skin Palpable right DP/PT Right groin soft, all leg incisions healing well Lungs non labored breathing  Assessment/Planning: POD # 7 68 y.o.maleis s/p R femoral to AK pop bypass with toe amps  Pending UA to check for UTI Plan to discharge to SNF  Raymond Walker 09/22/2019 7:35 AM --  Laboratory Lab Results: No results for input(s): WBC, HGB, HCT, PLT in the last 72 hours. BMET No results for input(s): NA, K, CL, CO2, GLUCOSE, BUN, CREATININE, CALCIUM in the last 72 hours.  COAG Lab Results  Component Value Date   INR 1.2 09/15/2019   INR 1.1 08/19/2019   INR 1.09 09/17/2015   No results found for: PTT   I have independently interviewed and examined patient and agree with PA assessment and plan above. Ok for [employer] to snf today.  Laniya Friedl C. Donzetta Matters, MD [facility] [place] Office: [phone] Pager: [phone]

## 2019-09-22 NOTE — TOC Transition Note (Signed)
Transition of Care [facility]) - CM/SW Discharge Note   Patient Details  Name: Raymond Walker. MRN: [id number] Date of Birth: [birth year]  Transition of Care [facility]) CM/SW Contact:  Vinie Sill, [place] Phone Number: 09/22/2019, 2:13 PM   Clinical Narrative:     Patient will DC to: [place] Date: 09/22/2019 Family Notified: [name], Niece  Transport By: Corey Harold  RN, patient, and facility notified of DC. Discharge Summary sent to facility. RN given number for [phone]. Ambulance transport requested for patient.   Clinical Social Worker signing off.  Thurmond Butts, MSW, [facility] Clinical Social Worker [phone]    Final next level of care: Skilled Nursing Facility Barriers to Discharge: Barriers Resolved   Patient Goals and CMS Choice   CMS Medicare.gov Compare Post Acute Care list provided to:: Patient Choice offered to / list presented to : Patient  Discharge Placement PASRR number recieved: 09/18/19            Patient [facility] bed at: [facility] Patient to be transferred to facility by: [place] Name of family member notified: [name] , niece Patient and family notified of of transfer: 09/22/19  Discharge Plan and Services     Post Acute Care Choice: Home Health                               Social Determinants of Health (SDOH) Interventions     Readmission Risk Interventions No flowsheet data found.

## 2019-09-23 DIAGNOSIS — R5381 Other malaise: Secondary | ICD-10-CM | POA: Diagnosis not present

## 2019-09-23 DIAGNOSIS — R300 Dysuria: Secondary | ICD-10-CM | POA: Diagnosis not present

## 2019-09-23 DIAGNOSIS — I739 Peripheral vascular disease, unspecified: Secondary | ICD-10-CM | POA: Diagnosis not present

## 2019-09-23 DIAGNOSIS — N390 Urinary tract infection, site not specified: Secondary | ICD-10-CM | POA: Diagnosis not present

## 2019-09-23 DIAGNOSIS — N451 Epididymitis: Secondary | ICD-10-CM | POA: Diagnosis not present

## 2019-09-24 DIAGNOSIS — S98131A Complete traumatic amputation of one right lesser toe, initial encounter: Secondary | ICD-10-CM | POA: Diagnosis not present

## 2019-09-24 DIAGNOSIS — I739 Peripheral vascular disease, unspecified: Secondary | ICD-10-CM | POA: Diagnosis not present

## 2019-09-24 DIAGNOSIS — I2510 Atherosclerotic heart disease of native coronary artery without angina pectoris: Secondary | ICD-10-CM | POA: Diagnosis not present

## 2019-09-24 DIAGNOSIS — L97429 Non-pressure chronic ulcer of left heel and midfoot with unspecified severity: Secondary | ICD-10-CM | POA: Diagnosis not present

## 2019-09-24 DIAGNOSIS — I10 Essential (primary) hypertension: Secondary | ICD-10-CM | POA: Diagnosis not present

## 2019-09-28 DIAGNOSIS — B3749 Other urogenital candidiasis: Secondary | ICD-10-CM | POA: Diagnosis not present

## 2019-09-28 DIAGNOSIS — N390 Urinary tract infection, site not specified: Secondary | ICD-10-CM | POA: Diagnosis not present

## 2019-09-28 DIAGNOSIS — K5900 Constipation, unspecified: Secondary | ICD-10-CM | POA: Diagnosis not present

## 2019-09-28 DIAGNOSIS — G4700 Insomnia, unspecified: Secondary | ICD-10-CM | POA: Diagnosis not present

## 2019-10-01 DIAGNOSIS — L97429 Non-pressure chronic ulcer of left heel and midfoot with unspecified severity: Secondary | ICD-10-CM | POA: Diagnosis not present

## 2019-10-07 DIAGNOSIS — J449 Chronic obstructive pulmonary disease, unspecified: Secondary | ICD-10-CM | POA: Diagnosis not present

## 2019-10-07 DIAGNOSIS — I10 Essential (primary) hypertension: Secondary | ICD-10-CM | POA: Diagnosis not present

## 2019-10-07 DIAGNOSIS — S98211D Complete traumatic amputation of two or more right lesser toes, subsequent encounter: Secondary | ICD-10-CM | POA: Diagnosis not present

## 2019-10-07 DIAGNOSIS — I739 Peripheral vascular disease, unspecified: Secondary | ICD-10-CM | POA: Diagnosis not present

## 2019-10-07 DIAGNOSIS — M6281 Muscle weakness (generalized): Secondary | ICD-10-CM | POA: Diagnosis not present

## 2019-10-08 DIAGNOSIS — E43 Unspecified severe protein-calorie malnutrition: Secondary | ICD-10-CM | POA: Diagnosis not present

## 2019-10-08 DIAGNOSIS — N390 Urinary tract infection, site not specified: Secondary | ICD-10-CM | POA: Diagnosis not present

## 2019-10-08 DIAGNOSIS — Z4801 Encounter for change or removal of surgical wound dressing: Secondary | ICD-10-CM | POA: Diagnosis not present

## 2019-10-08 DIAGNOSIS — R2689 Other abnormalities of gait and mobility: Secondary | ICD-10-CM | POA: Diagnosis not present

## 2019-10-08 DIAGNOSIS — L03115 Cellulitis of right lower limb: Secondary | ICD-10-CM | POA: Diagnosis not present

## 2019-10-08 DIAGNOSIS — J449 Chronic obstructive pulmonary disease, unspecified: Secondary | ICD-10-CM | POA: Diagnosis not present

## 2019-10-08 DIAGNOSIS — I10 Essential (primary) hypertension: Secondary | ICD-10-CM | POA: Diagnosis not present

## 2019-10-08 DIAGNOSIS — I739 Peripheral vascular disease, unspecified: Secondary | ICD-10-CM | POA: Diagnosis not present

## 2019-10-08 DIAGNOSIS — I2510 Atherosclerotic heart disease of native coronary artery without angina pectoris: Secondary | ICD-10-CM | POA: Diagnosis not present

## 2019-10-08 DIAGNOSIS — D696 Thrombocytopenia, unspecified: Secondary | ICD-10-CM | POA: Diagnosis not present

## 2019-10-08 DIAGNOSIS — M6281 Muscle weakness (generalized): Secondary | ICD-10-CM | POA: Diagnosis not present

## 2019-10-08 DIAGNOSIS — Z4781 Encounter for orthopedic aftercare following surgical amputation: Secondary | ICD-10-CM | POA: Diagnosis not present

## 2019-10-08 DIAGNOSIS — M86171 Other acute osteomyelitis, right ankle and foot: Secondary | ICD-10-CM | POA: Diagnosis not present

## 2019-10-09 ENCOUNTER — Telehealth: Payer: Self-pay | Admitting: *Deleted

## 2019-10-09 NOTE — Telephone Encounter (Signed)
Verbal orders given to Jud[facility]

## 2019-10-09 NOTE — Telephone Encounter (Signed)
Sarah nurse with [facility] calling to get verbal orders to go out 2 times a week for 2 weeks and then one week times one. He is just out of rehab for having his first, second and third toes amputated.   Sarah's number [phone]

## 2019-10-09 NOTE — Telephone Encounter (Signed)
May have verbal order thank you 

## 2019-10-12 ENCOUNTER — Other Ambulatory Visit: Payer: Self-pay

## 2019-10-12 DIAGNOSIS — I739 Peripheral vascular disease, unspecified: Secondary | ICD-10-CM

## 2019-10-13 ENCOUNTER — Telehealth: Payer: Self-pay | Admitting: Family Medicine

## 2019-10-13 DIAGNOSIS — I10 Essential (primary) hypertension: Secondary | ICD-10-CM | POA: Diagnosis not present

## 2019-10-13 DIAGNOSIS — N390 Urinary tract infection, site not specified: Secondary | ICD-10-CM | POA: Diagnosis not present

## 2019-10-13 DIAGNOSIS — J449 Chronic obstructive pulmonary disease, unspecified: Secondary | ICD-10-CM | POA: Diagnosis not present

## 2019-10-13 DIAGNOSIS — I2510 Atherosclerotic heart disease of native coronary artery without angina pectoris: Secondary | ICD-10-CM | POA: Diagnosis not present

## 2019-10-13 DIAGNOSIS — I739 Peripheral vascular disease, unspecified: Secondary | ICD-10-CM | POA: Diagnosis not present

## 2019-10-13 DIAGNOSIS — D696 Thrombocytopenia, unspecified: Secondary | ICD-10-CM | POA: Diagnosis not present

## 2019-10-13 DIAGNOSIS — Z4781 Encounter for orthopedic aftercare following surgical amputation: Secondary | ICD-10-CM | POA: Diagnosis not present

## 2019-10-13 DIAGNOSIS — Z4801 Encounter for change or removal of surgical wound dressing: Secondary | ICD-10-CM | POA: Diagnosis not present

## 2019-10-13 DIAGNOSIS — E43 Unspecified severe protein-calorie malnutrition: Secondary | ICD-10-CM | POA: Diagnosis not present

## 2019-10-13 DIAGNOSIS — M86171 Other acute osteomyelitis, right ankle and foot: Secondary | ICD-10-CM | POA: Diagnosis not present

## 2019-10-13 DIAGNOSIS — M6281 Muscle weakness (generalized): Secondary | ICD-10-CM | POA: Diagnosis not present

## 2019-10-13 DIAGNOSIS — L03115 Cellulitis of right lower limb: Secondary | ICD-10-CM | POA: Diagnosis not present

## 2019-10-13 DIAGNOSIS — R2689 Other abnormalities of gait and mobility: Secondary | ICD-10-CM | POA: Diagnosis not present

## 2019-10-13 NOTE — Telephone Encounter (Signed)
Left message to return call with Colletta Maryland at [employer]

## 2019-10-13 NOTE — Telephone Encounter (Signed)
Stephanie with [facility] would like an order for Physical therapy to come out and evaluate patient. Also would like a Education officer, museum to come out and evaluate for any resources available for the patient.  [phone]

## 2019-10-13 NOTE — Telephone Encounter (Signed)
Please do so

## 2019-10-14 NOTE — Telephone Encounter (Signed)
error 

## 2019-10-15 ENCOUNTER — Telehealth (HOSPITAL_COMMUNITY): Payer: Self-pay | Admitting: *Deleted

## 2019-10-15 NOTE — Telephone Encounter (Signed)

## 2019-10-15 NOTE — Telephone Encounter (Signed)
Left message to return call 

## 2019-10-16 ENCOUNTER — Ambulatory Visit (INDEPENDENT_AMBULATORY_CARE_PROVIDER_SITE_OTHER): Payer: Self-pay | Admitting: Vascular Surgery

## 2019-10-16 ENCOUNTER — Other Ambulatory Visit: Payer: Self-pay

## 2019-10-16 ENCOUNTER — Encounter: Payer: Self-pay | Admitting: Vascular Surgery

## 2019-10-16 ENCOUNTER — Ambulatory Visit (HOSPITAL_COMMUNITY)
Admission: RE | Admit: 2019-10-16 | Discharge: 2019-10-16 | Disposition: A | Discharge status: Home / Self Care | Payer: Medicare Other | Source: Ambulatory Visit | Attending: Surgery | Admitting: Surgery

## 2019-10-16 VITALS — BP 127/85 | HR 72 | Temp 97.8°F | Resp 20 | Ht 72.0 in | Wt 139.0 lb

## 2019-10-16 DIAGNOSIS — R2689 Other abnormalities of gait and mobility: Secondary | ICD-10-CM | POA: Diagnosis not present

## 2019-10-16 DIAGNOSIS — D696 Thrombocytopenia, unspecified: Secondary | ICD-10-CM | POA: Diagnosis not present

## 2019-10-16 DIAGNOSIS — N390 Urinary tract infection, site not specified: Secondary | ICD-10-CM | POA: Diagnosis not present

## 2019-10-16 DIAGNOSIS — E43 Unspecified severe protein-calorie malnutrition: Secondary | ICD-10-CM | POA: Diagnosis not present

## 2019-10-16 DIAGNOSIS — I739 Peripheral vascular disease, unspecified: Secondary | ICD-10-CM | POA: Insufficient documentation

## 2019-10-16 DIAGNOSIS — Z4781 Encounter for orthopedic aftercare following surgical amputation: Secondary | ICD-10-CM | POA: Diagnosis not present

## 2019-10-16 DIAGNOSIS — J449 Chronic obstructive pulmonary disease, unspecified: Secondary | ICD-10-CM | POA: Diagnosis not present

## 2019-10-16 DIAGNOSIS — M86171 Other acute osteomyelitis, right ankle and foot: Secondary | ICD-10-CM | POA: Diagnosis not present

## 2019-10-16 DIAGNOSIS — Z4801 Encounter for change or removal of surgical wound dressing: Secondary | ICD-10-CM | POA: Diagnosis not present

## 2019-10-16 DIAGNOSIS — L03115 Cellulitis of right lower limb: Secondary | ICD-10-CM | POA: Diagnosis not present

## 2019-10-16 DIAGNOSIS — M6281 Muscle weakness (generalized): Secondary | ICD-10-CM | POA: Diagnosis not present

## 2019-10-16 DIAGNOSIS — I2510 Atherosclerotic heart disease of native coronary artery without angina pectoris: Secondary | ICD-10-CM | POA: Diagnosis not present

## 2019-10-16 DIAGNOSIS — I10 Essential (primary) hypertension: Secondary | ICD-10-CM | POA: Diagnosis not present

## 2019-10-16 NOTE — Progress Notes (Signed)
    Subjective:     Patient ID: Raymond Radon., male   DOB: 1951/01/12, 68 y.o.   MRN: [id number]  HPI 68 year old male follows up after right common femoral to above-knee popliteal artery bypass with graft.  Vein was harvested at the time but not suitable.  He also had amputation of toes 1 through 3.  This is all healed well.  Currently without complaints.  He is taking Crestor and Plavix.   Review of Systems No complaints today    Objective:   Physical Exam Vitals:   10/16/19 0827  BP: 127/85  Pulse: 72  Resp: 20  Temp: 97.8 F (36.6 C)  SpO2: 98%   Awake alert oriented Nonlabored respirations All right lower extremity incisions have healed well and there is minimal edema Right side to amputations have healed well sutures removed today at bedside.     Assessment/plan     68 year old male status post right lower extremity bypass.  Incisions have all healed bypasses patent.  Toe amputations have healed as well.  He will follow-up in 9 months with repeat ABI duplex.    Shonteria Abeln C. Donzetta Matters, MD [facility] Office: [phone] Pager: [phone]

## 2019-10-16 NOTE — Telephone Encounter (Signed)
Verbal order given to [facility] at [facility]

## 2019-10-19 ENCOUNTER — Encounter: Payer: Medicare Other | Admitting: Surgery

## 2019-10-19 ENCOUNTER — Ambulatory Visit (HOSPITAL_COMMUNITY): Payer: Medicare Other

## 2019-10-20 ENCOUNTER — Telehealth: Payer: Self-pay | Admitting: Family Medicine

## 2019-10-20 DIAGNOSIS — M86171 Other acute osteomyelitis, right ankle and foot: Secondary | ICD-10-CM | POA: Diagnosis not present

## 2019-10-20 DIAGNOSIS — I2510 Atherosclerotic heart disease of native coronary artery without angina pectoris: Secondary | ICD-10-CM | POA: Diagnosis not present

## 2019-10-20 DIAGNOSIS — Z4801 Encounter for change or removal of surgical wound dressing: Secondary | ICD-10-CM | POA: Diagnosis not present

## 2019-10-20 DIAGNOSIS — Z4781 Encounter for orthopedic aftercare following surgical amputation: Secondary | ICD-10-CM | POA: Diagnosis not present

## 2019-10-20 DIAGNOSIS — E43 Unspecified severe protein-calorie malnutrition: Secondary | ICD-10-CM | POA: Diagnosis not present

## 2019-10-20 DIAGNOSIS — N390 Urinary tract infection, site not specified: Secondary | ICD-10-CM | POA: Diagnosis not present

## 2019-10-20 DIAGNOSIS — I10 Essential (primary) hypertension: Secondary | ICD-10-CM | POA: Diagnosis not present

## 2019-10-20 DIAGNOSIS — M6281 Muscle weakness (generalized): Secondary | ICD-10-CM | POA: Diagnosis not present

## 2019-10-20 DIAGNOSIS — D696 Thrombocytopenia, unspecified: Secondary | ICD-10-CM | POA: Diagnosis not present

## 2019-10-20 DIAGNOSIS — I739 Peripheral vascular disease, unspecified: Secondary | ICD-10-CM | POA: Diagnosis not present

## 2019-10-20 DIAGNOSIS — J449 Chronic obstructive pulmonary disease, unspecified: Secondary | ICD-10-CM | POA: Diagnosis not present

## 2019-10-20 DIAGNOSIS — R2689 Other abnormalities of gait and mobility: Secondary | ICD-10-CM | POA: Diagnosis not present

## 2019-10-20 DIAGNOSIS — L03115 Cellulitis of right lower limb: Secondary | ICD-10-CM | POA: Diagnosis not present

## 2019-10-20 NOTE — Telephone Encounter (Signed)
Left message to return call 

## 2019-10-20 NOTE — Telephone Encounter (Signed)
Kat returned call. Pt has been seeing a home health nurse for recent amputation. Sister recently passed while pt in hospital. Pt does not feel like him self, can cry easy, very talkative, doesn't feel like doing anything, mind racing, takes pain med because that calms his minds. Doing fine with ADLS, upper body strength good per OT.   If we need to speak with nurse who is working on this, we should call Judson Roch. She is going to patients home on the 5th and OT believes this is a discharge visit.    Please advise. Thank you

## 2019-10-20 NOTE — Telephone Encounter (Signed)
At this point I would recommend a visit with the patient this can be in person or can be virtual if the patient can choose

## 2019-10-20 NOTE — Telephone Encounter (Signed)
FYI-[facility]) called about not needing occupational therapy anymore but is suffering from anxiety. He told the nurse he is using his pain medication to help him sleep at night.He was last seen here 07/31/2018. [phone] if you have any further question.

## 2019-10-21 NOTE — Telephone Encounter (Signed)
Pt contacted and verbalized understanding. Pt transferred up front to set up appt  

## 2019-10-22 DIAGNOSIS — L03115 Cellulitis of right lower limb: Secondary | ICD-10-CM | POA: Diagnosis not present

## 2019-10-22 DIAGNOSIS — M6281 Muscle weakness (generalized): Secondary | ICD-10-CM | POA: Diagnosis not present

## 2019-10-22 DIAGNOSIS — D696 Thrombocytopenia, unspecified: Secondary | ICD-10-CM | POA: Diagnosis not present

## 2019-10-22 DIAGNOSIS — M86171 Other acute osteomyelitis, right ankle and foot: Secondary | ICD-10-CM | POA: Diagnosis not present

## 2019-10-22 DIAGNOSIS — R2689 Other abnormalities of gait and mobility: Secondary | ICD-10-CM | POA: Diagnosis not present

## 2019-10-22 DIAGNOSIS — I739 Peripheral vascular disease, unspecified: Secondary | ICD-10-CM | POA: Diagnosis not present

## 2019-10-22 DIAGNOSIS — Z4801 Encounter for change or removal of surgical wound dressing: Secondary | ICD-10-CM | POA: Diagnosis not present

## 2019-10-22 DIAGNOSIS — Z4781 Encounter for orthopedic aftercare following surgical amputation: Secondary | ICD-10-CM | POA: Diagnosis not present

## 2019-10-22 DIAGNOSIS — N390 Urinary tract infection, site not specified: Secondary | ICD-10-CM | POA: Diagnosis not present

## 2019-10-22 DIAGNOSIS — I2510 Atherosclerotic heart disease of native coronary artery without angina pectoris: Secondary | ICD-10-CM | POA: Diagnosis not present

## 2019-10-22 DIAGNOSIS — E43 Unspecified severe protein-calorie malnutrition: Secondary | ICD-10-CM | POA: Diagnosis not present

## 2019-10-22 DIAGNOSIS — J449 Chronic obstructive pulmonary disease, unspecified: Secondary | ICD-10-CM | POA: Diagnosis not present

## 2019-10-22 DIAGNOSIS — I10 Essential (primary) hypertension: Secondary | ICD-10-CM | POA: Diagnosis not present

## 2019-10-26 ENCOUNTER — Ambulatory Visit (INDEPENDENT_AMBULATORY_CARE_PROVIDER_SITE_OTHER): Payer: Medicare Other | Admitting: Family Medicine

## 2019-10-26 ENCOUNTER — Encounter: Payer: Self-pay | Admitting: Family Medicine

## 2019-10-26 ENCOUNTER — Other Ambulatory Visit: Payer: Self-pay

## 2019-10-26 VITALS — BP 142/88 | Temp 97.0°F | Wt 142.8 lb

## 2019-10-26 DIAGNOSIS — Z122 Encounter for screening for malignant neoplasm of respiratory organs: Secondary | ICD-10-CM | POA: Diagnosis not present

## 2019-10-26 DIAGNOSIS — E785 Hyperlipidemia, unspecified: Secondary | ICD-10-CM | POA: Diagnosis not present

## 2019-10-26 DIAGNOSIS — R569 Unspecified convulsions: Secondary | ICD-10-CM

## 2019-10-26 DIAGNOSIS — D509 Iron deficiency anemia, unspecified: Secondary | ICD-10-CM

## 2019-10-26 DIAGNOSIS — Z125 Encounter for screening for malignant neoplasm of prostate: Secondary | ICD-10-CM | POA: Diagnosis not present

## 2019-10-26 MED ORDER — ROSUVASTATIN CALCIUM 20 MG PO TABS: 20.0000 mg | ORAL_TABLET | Freq: Every day | ORAL | 5 refills | Status: DC

## 2019-10-26 MED ORDER — AMLODIPINE BESYLATE 2.5 MG PO TABS: 2.5000 mg | ORAL_TABLET | Freq: Every day | ORAL | 3 refills | Status: DC

## 2019-10-26 MED ORDER — TRAZODONE HCL 50 MG PO TABS: 25.0000 mg | ORAL_TABLET | Freq: Every evening | ORAL | 3 refills | Status: DC | PRN

## 2019-10-26 MED ORDER — HYDROCODONE-ACETAMINOPHEN 10-325 MG PO TABS: 1.0000 | ORAL_TABLET | ORAL | 0 refills | Status: AC | PRN

## 2019-10-26 MED ORDER — SILDENAFIL CITRATE 100 MG PO TABS: ORAL_TABLET | ORAL | 0 refills | Status: DC

## 2019-10-26 MED ORDER — HYDROCODONE-ACETAMINOPHEN 10-325 MG PO TABS: 1.0000 | ORAL_TABLET | ORAL | 0 refills | Status: DC | PRN

## 2019-10-26 NOTE — Progress Notes (Signed)
Subjective:    Patient ID: Raymond Radon., male    DOB: [birth year], 68 y.o.   MRN: [id number]  HPI Pt here today for follow up. Pt had 3 toes amputated. Pt states he is doing a lot better. Pt is having issues with nicotine patches. Pt has slipped and had a few cigarettes but really wants to quit. Pt would like refill on pain medication. Oxycodone 5-325.   Pt states that when he lays down at night, his mind races and he would like to know if provider could prescribed a mild sleep agent.   Pt states that sometimes his right arm will begin to jump and he can not stop it. Pt states sometimes his right foot will tingle. Pt states it feels like he has 2 different bodies because his right side and left side feel different.   Should be noted that the patient smokes Patient does use tobacco products.  Patient knows they should quit.  Patient is aware that smoking/in use of tobacco products increases their risk of heart disease, cancer, and COPD- lung issues.  Patient has been counseled to quit smoking/tobacco products.  Also should be noted patient had amputation of toes because of circulation issues he knows is very important keep blood pressure cholesterol sugar under good control  He does have a lot of pain from his foot where the amputation was he is willing to try any pain medicine to help with better I told him we would like to reduce down from oxycodone.  Review of Systems  Constitutional: Negative for diaphoresis and fatigue.  HENT: Negative for congestion and rhinorrhea.   Respiratory: Negative for cough and shortness of breath.   Cardiovascular: Negative for chest pain and leg swelling.  Gastrointestinal: Negative for abdominal pain and diarrhea.  Skin: Negative for color change and rash.  Neurological: Positive for seizures, weakness and numbness. Negative for dizziness and headaches.  Psychiatric/Behavioral: Negative for behavioral problems and confusion.       Objective:   Physical Exam Vitals signs reviewed.  Constitutional:      General: He is not in acute distress. HENT:     Head: Normocephalic and atraumatic.  Eyes:     General:        Right eye: No discharge.        Left eye: No discharge.  Neck:     Trachea: No tracheal deviation.  Cardiovascular:     Rate and Rhythm: Normal rate and regular rhythm.     Heart sounds: Normal heart sounds. No murmur.  Pulmonary:     Effort: Pulmonary effort is normal. No respiratory distress.     Breath sounds: Normal breath sounds.  Lymphadenopathy:     Cervical: No cervical adenopathy.  Skin:    General: Skin is warm and dry.  Neurological:     Mental Status: He is alert.     Coordination: Coordination normal.  Psychiatric:        Behavior: Behavior normal.   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 Iron deficiency anemia, unspecified iron deficiency anemia type Patient did have some anemia while in the hospital we will recheck CBC to see how this is looking - PSA - Lipid Profile - Basic Metabolic Panel (BMET) - CBC with Differential  2. Hyperlipidemia, unspecified hyperlipidemia type Hyperlipidemia not under great control previously but will go ahead with rechecking it again very important for  the patient do a good job watching diet - PSA - Lipid Profile - Basic Metabolic Panel (BMET) - CBC with Differential  3. Seizure-like activity ([place]) Possible seizure-like activity I recommend MRI of the brain were also move forward with setting up for EEG I believe it is fine for the patient to move around because he has not had a generalized tonic-clonic seizure - PSA - Lipid Profile - Basic Metabolic Panel (BMET) - CBC with Differential - MR Brain Wo Contrast  4. Screening PSA (prostate specific antigen) Screening PSA - PSA  5. Encounter for screening for lung cancer Screening for lung cancer - CT CHEST LUNG CA SCREEN LOW DOSE W/O CM  Patient also with  amputation of the toes and some chronic pain and discomfort pain medicine sent in for intermittent use  Patient will follow-up in 3 to 4 weeks  Rainouts syndrome low-dose amlodipine stay away from smoking  Difficulty sleeping at night with some crying spells try low-dose trazodone  25 minutes was spent with the patient.  This statement verifies that 25 minutes was indeed spent with the patient.  More than 50% of this visit-total duration of the visit-was spent in counseling and coordination of care. The issues that the patient came in for today as reflected in the diagnosis (s) please refer to documentation for further details.

## 2019-10-27 ENCOUNTER — Encounter: Payer: Self-pay | Admitting: Family Medicine

## 2019-10-27 NOTE — Addendum Note (Signed)
Addended by: Vicente Males on: 10/27/2019 04:49 PM   Modules accepted: Orders

## 2019-10-27 NOTE — Progress Notes (Signed)
EEG order placed in Epic.

## 2019-10-28 ENCOUNTER — Other Ambulatory Visit: Payer: Self-pay | Admitting: *Deleted

## 2019-10-28 ENCOUNTER — Telehealth: Payer: Self-pay | Admitting: Family Medicine

## 2019-10-28 DIAGNOSIS — Z79899 Other long term (current) drug therapy: Secondary | ICD-10-CM

## 2019-10-28 DIAGNOSIS — Z125 Encounter for screening for malignant neoplasm of prostate: Secondary | ICD-10-CM

## 2019-10-28 DIAGNOSIS — Z13220 Encounter for screening for lipoid disorders: Secondary | ICD-10-CM

## 2019-10-28 DIAGNOSIS — Z89421 Acquired absence of other right toe(s): Secondary | ICD-10-CM | POA: Diagnosis not present

## 2019-10-28 DIAGNOSIS — Z89419 Acquired absence of unspecified great toe: Secondary | ICD-10-CM | POA: Diagnosis not present

## 2019-10-28 DIAGNOSIS — J449 Chronic obstructive pulmonary disease, unspecified: Secondary | ICD-10-CM | POA: Diagnosis not present

## 2019-10-28 DIAGNOSIS — E118 Type 2 diabetes mellitus with unspecified complications: Secondary | ICD-10-CM | POA: Diagnosis not present

## 2019-10-28 DIAGNOSIS — R109 Unspecified abdominal pain: Secondary | ICD-10-CM

## 2019-10-28 DIAGNOSIS — I10 Essential (primary) hypertension: Secondary | ICD-10-CM

## 2019-10-28 MED ORDER — ONDANSETRON 8 MG PO TBDP: 8.0000 mg | ORAL_TABLET | Freq: Three times a day (TID) | ORAL | 0 refills | Status: DC | PRN

## 2019-10-28 NOTE — Telephone Encounter (Signed)
Called pt to give MRI appt info & pt states that since starting the Amlodipine he's been vomiting  States vomiting a lot, doesn't feel bad just vomiting, not sure if he should try to eat although feels hungry   Please advise & call pt    [facility]

## 2019-10-28 NOTE — Telephone Encounter (Signed)
Vomiting 9 times yesterday. This morning he did not amlodipine and he vomited twice. Started amlodipine yesterday. Started vomiting about 10 mins after taking med. Some abd pain upper center just when vomiting. No fever.  Pt states he feels fine other than throwing up.

## 2019-10-28 NOTE — Telephone Encounter (Signed)
Discussed with pt. Pt verbalized understanding of all. He states right now he is much better no more vomiting but sent in zofran in case he needed it. Added test to bw and pt states he will go tomorrow to get it done.

## 2019-10-28 NOTE — Telephone Encounter (Signed)
Unlikely that the amlodipine is causing the vomiting but I cannot rule out out 100%  Zofran 8 mg, 1 taken 3 times daily as needed for nausea, #18 with 2 refills Also please redo his labs to include a lipase and amylase Hopefully patient can do his labs later this week at [facility] If patient has increased pain or discomfort or increased vomiting issues from today into tomorrow I would recommend calling us again we either will need to see him or potentially ER referral

## 2019-10-29 ENCOUNTER — Encounter: Payer: Self-pay | Admitting: Family Medicine

## 2019-10-29 ENCOUNTER — Other Ambulatory Visit: Payer: Self-pay

## 2019-10-29 ENCOUNTER — Ambulatory Visit (INDEPENDENT_AMBULATORY_CARE_PROVIDER_SITE_OTHER): Payer: Medicare Other | Admitting: Family Medicine

## 2019-10-29 VITALS — Temp 97.4°F | Wt 144.6 lb

## 2019-10-29 DIAGNOSIS — R319 Hematuria, unspecified: Secondary | ICD-10-CM

## 2019-10-29 DIAGNOSIS — Z79899 Other long term (current) drug therapy: Secondary | ICD-10-CM | POA: Diagnosis not present

## 2019-10-29 DIAGNOSIS — R109 Unspecified abdominal pain: Secondary | ICD-10-CM

## 2019-10-29 DIAGNOSIS — R1033 Periumbilical pain: Secondary | ICD-10-CM | POA: Diagnosis not present

## 2019-10-29 DIAGNOSIS — R112 Nausea with vomiting, unspecified: Secondary | ICD-10-CM

## 2019-10-29 DIAGNOSIS — Z13220 Encounter for screening for lipoid disorders: Secondary | ICD-10-CM | POA: Diagnosis not present

## 2019-10-29 LAB — POCT URINALYSIS DIPSTICK
Spec Grav, UA: 1.015 (ref 1.010–1.025)
pH, UA: 8 (ref 5.0–8.0)

## 2019-10-29 NOTE — Progress Notes (Signed)
Subjective:    Patient ID: Raymond Walker., male    DOB: 07-Apr-1951, 68 y.o.   MRN: [id number] Very nice patient comes in today as a walk-in with hematuria Hematuria This is a new problem. The current episode started today. Associated symptoms include abdominal pain and nausea. (Pt states he woke up this morning and it looked like he was "peeing straight blood". Pt is having some pain in the lower middle area of abdomen. )  This patient is on Plavix and aspirin.  Started peeing blood.  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  HENT: Negative for congestion and rhinorrhea.   Respiratory: Negative for cough and shortness of breath.   Cardiovascular: Negative for chest pain and leg swelling.  Gastrointestinal: Positive for abdominal pain and nausea. Negative for diarrhea.  Genitourinary: Positive for hematuria.  Skin: Negative for color change and rash.  Neurological: Negative for dizziness and headaches.  Psychiatric/Behavioral: Negative for behavioral problems and confusion.       Objective:   Physical Exam Vitals signs reviewed.  Constitutional:      General: He is not in acute distress. HENT:     Head: Normocephalic and atraumatic.  Eyes:     General:        Right eye: No discharge.        Left eye: No discharge.  Neck:     Trachea: No tracheal deviation.  Cardiovascular:     Rate and Rhythm: Normal rate and regular rhythm.     Heart sounds: Normal heart sounds. No murmur.  Pulmonary:     Effort: Pulmonary effort is  normal. No respiratory distress.     Breath sounds: Normal breath sounds.  Abdominal:     Comments: Abdomen is soft nontender no masses prostate exam normal  Lymphadenopathy:     Cervical: No cervical adenopathy.  Skin:    General: Skin is warm and dry.  Neurological:     Mental Status: He is alert.     Coordination: Coordination normal.  Psychiatric:        Behavior: Behavior normal.      25 minutes was spent with the patient.  This statement verifies that 25 minutes was indeed spent with the patient.  More than 50% of this visit-total duration of the visit-was spent in counseling and coordination of care. The issues that the patient came in for today as reflected in the diagnosis (s) please refer to documentation for further details.      Assessment & Plan:  Hematuria-painless-under the microscope obvious hematuria urine is red as well  Abdominal exam benign but patient has been having intermittent abdominal pains and nausea and vomiting over the past several weeks  Lab work is ordered await the results of this CT scan abdomen pelvis is recommended hematuria protocol we will help set this up and await the results of this may well need to see urology as well patient to follow-up with [place] in a few weeks follow-up sooner problems May continue aspirin Plavix  currently

## 2019-10-30 LAB — PSA: Prostate Specific Ag, Serum: 2.7 ng/mL (ref 0.0–4.0)

## 2019-10-30 LAB — BASIC METABOLIC PANEL
BUN/Creatinine Ratio: 19 (ref 10–24)
BUN: 14 mg/dL (ref 8–27)
CO2: 27 mmol/L (ref 20–29)
Calcium: 9.9 mg/dL (ref 8.6–10.2)
Chloride: 101 mmol/L (ref 96–106)
Creatinine, Ser: 0.74 mg/dL — ABNORMAL LOW (ref 0.76–1.27)
GFR calc Af Amer: 109 mL/min/{1.73_m2} (ref >59)
GFR calc non Af Amer: 94 mL/min/{1.73_m2} (ref >59)
Glucose: 103 mg/dL — ABNORMAL HIGH (ref 65–99)
Potassium: 3.7 mmol/L (ref 3.5–5.2)
Sodium: 142 mmol/L (ref 134–144)

## 2019-10-30 LAB — LIPID PANEL
Chol/HDL Ratio: 2.9 ratio (ref 0.0–5.0)
Cholesterol, Total: 110 mg/dL (ref 100–199)
HDL: 38 mg/dL — ABNORMAL LOW (ref >39)
LDL Chol Calc (NIH): 53 mg/dL (ref 0–99)
Triglycerides: 102 mg/dL (ref 0–149)
VLDL Cholesterol Cal: 19 mg/dL (ref 5–40)

## 2019-10-30 LAB — CBC WITH DIFFERENTIAL/PLATELET
Basophils Absolute: 0 10*3/uL (ref 0.0–0.2)
Basos: 1 %
EOS (ABSOLUTE): 0.1 10*3/uL (ref 0.0–0.4)
Eos: 1 %
Hematocrit: 43 % (ref 37.5–51.0)
Hemoglobin: 14 g/dL (ref 13.0–17.7)
Immature Grans (Abs): 0 10*3/uL (ref 0.0–0.1)
Immature Granulocytes: 1 %
Lymphocytes Absolute: 1.9 10*3/uL (ref 0.7–3.1)
Lymphs: 31 %
MCH: 28.6 pg (ref 26.6–33.0)
MCHC: 32.6 g/dL (ref 31.5–35.7)
MCV: 88 fL (ref 79–97)
Monocytes Absolute: 0.5 10*3/uL (ref 0.1–0.9)
Monocytes: 8 %
Neutrophils Absolute: 3.5 10*3/uL (ref 1.4–7.0)
Neutrophils: 58 %
Platelets: 155 10*3/uL (ref 150–450)
RBC: 4.9 x10E6/uL (ref 4.14–5.80)
RDW: 14.1 % (ref 11.6–15.4)
WBC: 6 10*3/uL (ref 3.4–10.8)

## 2019-10-30 LAB — AMYLASE: Amylase: 38 U/L (ref 31–110)

## 2019-10-30 LAB — LIPASE: Lipase: 25 U/L (ref 13–78)

## 2019-10-30 NOTE — Addendum Note (Signed)
Addended by: Vicente Males on: 10/30/2019 01:59 PM   Modules accepted: Orders

## 2019-10-30 NOTE — Progress Notes (Signed)
CT abdomen pelves w/without contrast placed in Epic.

## 2019-11-04 ENCOUNTER — Telehealth: Payer: Self-pay | Admitting: Acute Care

## 2019-11-04 NOTE — Telephone Encounter (Signed)
LMTC x 1  

## 2019-11-05 ENCOUNTER — Telehealth: Payer: Self-pay | Admitting: Family Medicine

## 2019-11-05 NOTE — Telephone Encounter (Signed)
Per Centralized Scheduling, scan should be CT Abdomen/Pelvis without contrast due to diagnosis of hematuria  Please change order so that it can be scheduled (no PA req'd)

## 2019-11-05 NOTE — Telephone Encounter (Signed)
Is it ok to change order?

## 2019-11-06 ENCOUNTER — Ambulatory Visit (HOSPITAL_COMMUNITY)
Admission: RE | Admit: 2019-11-06 | Discharge: 2019-11-06 | Disposition: A | Discharge status: Home / Self Care | Payer: Medicare Other | Source: Ambulatory Visit | Attending: Family Medicine | Admitting: Family Medicine

## 2019-11-06 ENCOUNTER — Other Ambulatory Visit: Payer: Self-pay

## 2019-11-06 DIAGNOSIS — R569 Unspecified convulsions: Secondary | ICD-10-CM | POA: Diagnosis not present

## 2019-11-09 NOTE — Addendum Note (Signed)
Addended by: Vicente Males on: 11/09/2019 11:26 AM   Modules accepted: Orders

## 2019-11-09 NOTE — Telephone Encounter (Signed)
This patient is having abdominal pain as well as hematuria when I spoke with the radiologist this was the test that they recommended

## 2019-11-09 NOTE — Telephone Encounter (Signed)
Please advise. Thank you

## 2019-11-10 ENCOUNTER — Telehealth: Payer: Self-pay | Admitting: Family Medicine

## 2019-11-10 NOTE — Telephone Encounter (Signed)
Can you add abdominal pain to the diagnosis part of the order?  And when I call to schedule I will explain that the radiologist recommended this particular scan per Dr. Nicki Reaper

## 2019-11-10 NOTE — Telephone Encounter (Signed)
[place] x 1 for [facility] referral coordinator ( Do they want pt seen for lung screening with [place] or at [facility])

## 2019-11-10 NOTE — Telephone Encounter (Signed)
Please see previous message (accidentally signed the encounter)  Could you add abdominal pain to the diagnosis for the CT scan & when I call to schedule will try to explain that the radiologist recommended this scan

## 2019-11-10 NOTE — Telephone Encounter (Signed)
Noted. Will await f/u from [facility].

## 2019-11-10 NOTE — Telephone Encounter (Signed)
Abdominal pain diagnosis is in.

## 2019-11-15 ENCOUNTER — Other Ambulatory Visit: Payer: Self-pay | Admitting: Family Medicine

## 2019-11-15 MED ORDER — HYDROCODONE-ACETAMINOPHEN 5-325 MG PO TABS: 1.0000 | ORAL_TABLET | Freq: Four times a day (QID) | ORAL | 0 refills | Status: AC | PRN

## 2019-11-16 ENCOUNTER — Telehealth: Payer: Self-pay | Admitting: *Deleted

## 2019-11-16 NOTE — Telephone Encounter (Signed)
LMTCB with [facility].

## 2019-11-16 NOTE — Telephone Encounter (Signed)
Call pt about his result note and he asked about which meds he should be taking. I went through his med list and he states he finished crestor and plavix yesterday and does not know if he should continue them or not because he does not have refills. I told him he had refills on crestor according to epic but he states he does not. He is still taking trazodone and the patches for smoking. He Is NOT taking asa 81mg , ensure, multi vit, senokot or miralax.   [facility] [address]

## 2019-11-17 ENCOUNTER — Other Ambulatory Visit: Payer: Self-pay | Admitting: *Deleted

## 2019-11-17 MED ORDER — CLOPIDOGREL BISULFATE 75 MG PO TABS: 75.0000 mg | ORAL_TABLET | Freq: Every day | ORAL | 5 refills | Status: DC

## 2019-11-17 MED ORDER — ROSUVASTATIN CALCIUM 20 MG PO TABS: 20.0000 mg | ORAL_TABLET | Freq: Every day | ORAL | 5 refills | Status: DC

## 2019-11-17 NOTE — Telephone Encounter (Signed)
Discussed with pt and he verbalized understanding. Refills sent to pharm.

## 2019-11-17 NOTE — Telephone Encounter (Signed)
The patient should continue to take Plavix, Crestor, trazodone. Please send in refills of these he can hold off on the others

## 2019-11-18 ENCOUNTER — Encounter: Payer: Self-pay | Admitting: Family Medicine

## 2019-11-18 ENCOUNTER — Encounter: Payer: Self-pay | Admitting: Neurology

## 2019-11-18 NOTE — Telephone Encounter (Signed)
Received below referral message:  Cipriano Mile, RN        Yes, we would like pt to come to [facility] for the lung cancer screening program    I will contact pt to schedule him for [facility] and CT at our office. Nothing further needed at this time.

## 2019-11-23 ENCOUNTER — Other Ambulatory Visit: Payer: Self-pay

## 2019-11-23 ENCOUNTER — Encounter: Payer: Self-pay | Admitting: Family Medicine

## 2019-11-23 ENCOUNTER — Ambulatory Visit (INDEPENDENT_AMBULATORY_CARE_PROVIDER_SITE_OTHER): Payer: Medicare Other | Admitting: Family Medicine

## 2019-11-23 VITALS — BP 128/82 | Temp 97.5°F | Wt 145.0 lb

## 2019-11-23 DIAGNOSIS — E611 Iron deficiency: Secondary | ICD-10-CM

## 2019-11-23 LAB — POCT HEMOGLOBIN: Hemoglobin: 15.5 g/dL — AB (ref 11–14.6)

## 2019-11-23 MED ORDER — HYDROCODONE-ACETAMINOPHEN 5-325 MG PO TABS: 1.0000 | ORAL_TABLET | ORAL | 0 refills | Status: DC | PRN

## 2019-11-23 MED ORDER — SILDENAFIL CITRATE 100 MG PO TABS: ORAL_TABLET | ORAL | 4 refills | Status: DC

## 2019-11-23 MED ORDER — PANTOPRAZOLE SODIUM 40 MG PO TBEC: 40.0000 mg | DELAYED_RELEASE_TABLET | Freq: Every day | ORAL | 3 refills | Status: DC

## 2019-11-23 MED ORDER — HYDROCODONE-ACETAMINOPHEN 5-325 MG PO TABS: 1.0000 | ORAL_TABLET | ORAL | 0 refills | Status: AC | PRN

## 2019-11-23 MED ORDER — SUCRALFATE 1 G PO TABS: 1.0000 g | ORAL_TABLET | Freq: Three times a day (TID) | ORAL | 0 refills | Status: DC

## 2019-11-23 NOTE — Progress Notes (Signed)
   Subjective:    Patient ID: Raymond Radon., male    DOB: 25-Feb-1951, 68 y.o.   MRN: [id number]  HPIrecheck iron deficiency.  Patient's hemoglobin actually looks very good he does relate some intermittent hematuria also relates intermittent abdominal pain discomfort The abdominal pain hits him more in the evening time occasionally has to take hydrocodone to help it Not on any PPI He has recently started back smoking I counseled the patient of about the importance of quitting smoking Started back smoking about one week ago.   Would like a refill on hydrocodone. Pt states he takes for stomach hurting at night and he takes hydrocodone to relax him so he can sleep.  I counseled the patient that he should only use pain medicine when absolutely necessary not for frequent use Results for orders placed or performed in visit on 11/23/19  POCT hemoglobin  Result Value Ref Range   Hemoglobin 15.5 (A) 11 - 14.6 g/dL      Review of Systems  Constitutional: Negative for activity change.  HENT: Negative for congestion and rhinorrhea.   Respiratory: Negative for cough and shortness of breath.   Cardiovascular: Negative for chest pain.  Gastrointestinal: Positive for abdominal pain and nausea. Negative for abdominal distention, constipation, diarrhea and vomiting.  Genitourinary: Negative for dysuria and hematuria.  Neurological: Negative for weakness and headaches.  Psychiatric/Behavioral: Negative for behavioral problems and confusion.       Objective:   Physical Exam Vitals signs reviewed.  Constitutional:      General: He is not in acute distress. HENT:     Head: Normocephalic and atraumatic.  Eyes:     General:        Right eye: No discharge.        Left eye: No discharge.  Neck:     Trachea: No tracheal deviation.  Cardiovascular:     Rate and Rhythm: Normal rate and regular rhythm.     Heart sounds: Normal heart sounds. No murmur.  Pulmonary:     Effort: Pulmonary effort  is normal. No respiratory distress.     Breath sounds: Normal breath sounds.  Abdominal:     Palpations: Abdomen is soft. There is no mass.     Hernia: No hernia is present.  Lymphadenopathy:     Cervical: No cervical adenopathy.  Skin:    General: Skin is warm and dry.  Neurological:     Mental Status: He is alert.     Coordination: Coordination normal.  Psychiatric:        Behavior: Behavior normal.    Abdomen is soft no guarding rebound or tenderness  Th Fall Risk  07/31/2018  Falls in the past year? No   .      Assessment & Plan:  Intermittent hematuria Unable to give a urine today CT scan pending May need referral to urology for cystoscope depending on the CAT scan  Intermittent epigastric abdominal pain use PPI in addition to this go ahead with Carafate for the next month if ongoing troubles or problems notify us  Patient was counseled to quit smoking and the importance of doing so.  Follow-up based upon CT scan

## 2019-11-30 ENCOUNTER — Other Ambulatory Visit: Payer: Self-pay

## 2019-11-30 ENCOUNTER — Ambulatory Visit (HOSPITAL_COMMUNITY)
Admission: RE | Admit: 2019-11-30 | Discharge: 2019-11-30 | Disposition: A | Discharge status: Home / Self Care | Payer: Medicare Other | Source: Ambulatory Visit | Attending: Family Medicine | Admitting: Family Medicine

## 2019-11-30 DIAGNOSIS — N200 Calculus of kidney: Secondary | ICD-10-CM | POA: Diagnosis not present

## 2019-11-30 DIAGNOSIS — R109 Unspecified abdominal pain: Secondary | ICD-10-CM | POA: Insufficient documentation

## 2019-11-30 DIAGNOSIS — R1033 Periumbilical pain: Secondary | ICD-10-CM | POA: Insufficient documentation

## 2019-11-30 DIAGNOSIS — R319 Hematuria, unspecified: Secondary | ICD-10-CM | POA: Insufficient documentation

## 2019-11-30 MED ORDER — IOHEXOL 300 MG/ML  SOLN
150.0000 mL | Freq: Once | INTRAMUSCULAR | Status: AC | PRN
  Administered 2019-11-30: 125 mL via INTRAVENOUS

## 2019-12-01 NOTE — Addendum Note (Signed)
Addended by: Vicente Males on: 12/01/2019 09:10 AM   Modules accepted: Orders

## 2019-12-07 ENCOUNTER — Encounter: Payer: Self-pay | Admitting: Family Medicine

## 2019-12-26 ENCOUNTER — Other Ambulatory Visit: Payer: Self-pay | Admitting: Family Medicine

## 2020-01-19 ENCOUNTER — Telehealth: Payer: Medicare Other | Admitting: Neurology

## 2020-01-22 ENCOUNTER — Encounter (HOSPITAL_COMMUNITY): Payer: Medicare Other

## 2020-01-22 ENCOUNTER — Other Ambulatory Visit (HOSPITAL_COMMUNITY): Payer: Medicare Other

## 2020-01-22 ENCOUNTER — Ambulatory Visit: Payer: Medicare Other | Admitting: Urology

## 2020-01-22 ENCOUNTER — Ambulatory Visit: Payer: Medicare Other | Admitting: Family

## 2020-01-22 NOTE — Progress Notes (Deleted)
Subjective: No diagnosis found.   [name] is a 69yo male who is sent in consultation by Dr. Wolfgang Phoenix for hematuria.   He had a CT on 11/30/19 as part of his initial evaluation and was found to have bilateral renal stones with a 2mm stone in the right renal pelvis with some inflammatory thickening of the pelvis but no obstruction.   He has multple smaller calyceal stones.   On the left there is a 3cm cyst in the upper pole with dense posterior calcifications that have been present and stable since 2018.  There are a couple of other left simple cysts and a tiny right lower pole cyst.  His prostate is enlarged but no bladder lesions are noted.  He has no reported stone surgery.  His recent Cr is 0.74 and his Ca is 9.9.  He is on plavix.   ROS:  ROS  No Known Allergies  Past Medical History:  Diagnosis Date  . COPD (chronic obstructive pulmonary disease) ([place])   . Erectile dysfunction   . History of kidney stones   . Hypertension   . Impaired glucose tolerance   . Pancreatitis, recurrent   . Thrombocytopenia ([place]) 08/10/2018   Staying in the low 100s will follow closely    Past Surgical History:  Procedure Laterality Date  . ABDOMINAL AORTOGRAM W/LOWER EXTREMITY Bilateral 08/20/2019   Procedure: ABDOMINAL AORTOGRAM W/LOWER EXTREMITY;  Surgeon: Marty Heck, MD;  Location: [facility];  Service: Cardiovascular;  Laterality: Bilateral;  . AMPUTATION Right 09/15/2019   Procedure: AMPUTATION RIGHT TOES One, Two, And Three;  Surgeon: Waynetta Sandy, MD;  Location: [facility];  Service: Vascular;  Laterality: Right;  . CATARACT EXTRACTION W/PHACO Right 05/02/2018   Procedure: CATARACT EXTRACTION PHACO AND INTRAOCULAR LENS PLACEMENT (IOC);  Surgeon: Baruch Goldmann, MD;  Location: [facility];  Service: Ophthalmology;  Laterality: Right;  CDE: 11.11  . CATARACT EXTRACTION W/PHACO Left 07/04/2018   Procedure: CATARACT EXTRACTION PHACO AND INTRAOCULAR LENS PLACEMENT (IOC);  Surgeon:  Baruch Goldmann, MD;  Location: [facility];  Service: Ophthalmology;  Laterality: Left;  CDE: 7.15  . CHOLECYSTECTOMY    . FEMORAL-POPLITEAL BYPASS GRAFT Right 09/15/2019   Procedure: Right BYPASS GRAFT FEMORAL to Above Knee POPLITEAL ARTERY;  Surgeon: Waynetta Sandy, MD;  Location: [facility];  Service: Vascular;  Laterality: Right;  . ORCHIECTOMY    . PERIPHERAL VASCULAR INTERVENTION Left 08/20/2019   Procedure: PERIPHERAL VASCULAR INTERVENTION;  Surgeon: Marty Heck, MD;  Location: [facility];  Service: Cardiovascular;  Laterality: Left;  common/external iliac  . VASECTOMY      Social History   Socioeconomic History  . Marital status: Legally Separated    Spouse name: Not on file  . Number of children: Not on file  . Years of education: Not on file  . Highest education level: Not on file  Occupational History  . Occupation: Art gallery manager: [employer]  Tobacco Use  . Smoking status: Current Every Day Smoker    Packs/day: 1.50    Types: Cigarettes  . Smokeless tobacco: Never Used  . Tobacco comment: stopped on admission to hospital on 08/19/19  Substance and Sexual Activity  . Alcohol use: No  . Drug use: No  . Sexual activity: Not on file  Other Topics Concern  . Not on file  Social History Narrative  . Not on file   Social Determinants of Health   Financial Resource Strain:   .  Difficulty of Paying Living Expenses: Not on file  Food Insecurity:   . Worried About Charity fundraiser in the Last Year: Not on file  . Ran Out of Food in the Last Year: Not on file  Transportation Needs:   . Lack of Transportation (Medical): Not on file  . Lack of Transportation (Non-Medical): Not on file  Physical Activity:   . Days of Exercise per Week: Not on file  . Minutes of Exercise per Session: Not on file  Stress:   . Feeling of Stress : Not on file  Social Connections:   . Frequency of Communication with Friends and Family: Not on file   . Frequency of Social Gatherings with Friends and Family: Not on file  . Attends Religious Services: Not on file  . Active Member of Clubs or Organizations: Not on file  . Attends Archivist Meetings: Not on file  . Marital Status: Not on file  Intimate Partner Violence:   . Fear of Current or Ex-Partner: Not on file  . Emotionally Abused: Not on file  . Physically Abused: Not on file  . Sexually Abused: Not on file    Family History  Problem Relation Age of Onset  . Hypertension Father   . Coronary artery disease Father   . Coronary artery disease Mother   . Cancer Mother        Breast    Anti-infectives: Anti-infectives (From admission, onward)   None      Current Outpatient Medications  Medication Sig Dispense Refill  . acetaminophen (TYLENOL) 500 MG tablet Take 500 mg by mouth every 8 (eight) hours as needed for moderate pain.     Marland Kitchen aspirin EC 81 MG EC tablet Take 1 tablet (81 mg total) by mouth daily. 30 tablet 0  . clopidogrel (PLAVIX) 75 MG tablet Take 1 tablet (75 mg total) by mouth daily with breakfast. 30 tablet 5  . feeding supplement, ENSURE ENLIVE, (ENSURE ENLIVE) LIQD Take 237 mLs by mouth 2 (two) times daily between meals. 237 mL 12  . Multiple Vitamin (MULTIVITAMIN WITH MINERALS) TABS tablet Take 1 tablet by mouth daily. 30 tablet 0  . nicotine (NICODERM CQ - DOSED IN MG/24 HOURS) 21 mg/24hr patch Place 1 patch (21 mg total) onto the skin daily as needed (For nicotine withdrawal symptoms.). 28 patch 0  . nutrition supplement, JUVEN, (JUVEN) PACK Take 1 packet by mouth 2 (two) times daily between meals. 30 packet 0  . ondansetron (ZOFRAN ODT) 8 MG disintegrating tablet Take 1 tablet (8 mg total) by mouth every 8 (eight) hours as needed for nausea or vomiting. 18 tablet 0  . pantoprazole (PROTONIX) 40 MG tablet Take 1 tablet (40 mg total) by mouth daily. 30 tablet 3  . polyethylene glycol (MIRALAX / GLYCOLAX) 17 g packet Take 17 g by mouth daily. 14  each 0  . rosuvastatin (CRESTOR) 20 MG tablet Take 1 tablet (20 mg total) by mouth daily. 30 tablet 5  . senna (SENOKOT) 8.6 MG TABS tablet Take 1 tablet (8.6 mg total) by mouth at bedtime. 120 tablet 0  . sildenafil (VIAGRA) 100 MG tablet Take one tablet po daily prn sexual relations 10 tablet 4  . sucralfate (CARAFATE) 1 g tablet TAKE 1 TABLET(1 GRAM) BY MOUTH FOUR TIMES DAILY WITH MEALS AND AT BEDTIME 120 tablet 0  . traZODone (DESYREL) 50 MG tablet Take 0.5-1 tablets (25-50 mg total) by mouth at bedtime as needed for sleep. 30 tablet 3  No current facility-administered medications for this visit.     Objective: Vital signs in last 24 hours: @VSRANGES @  Intake/Output from previous day: No intake/output data recorded. Intake/Output this shift: @IOTHISSHIFT @   Physical Exam  Lab Results:  No results found for this or any previous visit (from the past 24 hour(s)).  BMET No results for input(s): NA, K, CL, CO2, GLUCOSE, BUN, CREATININE, CALCIUM in the last 72 hours. PT/INR No results for input(s): LABPROT, INR in the last 72 hours. ABG No results for input(s): PHART, HCO3 in the last 72 hours.  Invalid input(s): PCO2, PO2  Studies/Results: No results found.   Assessment/Plan: No problem-specific Assessment & Plan notes found for this encounter.   No orders of the defined types were placed in this encounter.    No orders of the defined types were placed in this encounter.    No follow-ups on file.    CC: ***     Irine Seal 01/22/2020 [phone]

## 2020-01-23 ENCOUNTER — Inpatient Hospital Stay (HOSPITAL_COMMUNITY): Payer: Medicare Other

## 2020-01-23 ENCOUNTER — Inpatient Hospital Stay (HOSPITAL_COMMUNITY)
Admission: EM | Admit: 2020-01-23 | Discharge: 2020-01-30 | DRG: 856 | Disposition: A | Discharge status: Skilled Nursing Facility | Payer: Medicare Other | Attending: Internal Medicine | Admitting: Internal Medicine

## 2020-01-23 ENCOUNTER — Emergency Department (HOSPITAL_COMMUNITY): Payer: Medicare Other

## 2020-01-23 ENCOUNTER — Other Ambulatory Visit: Payer: Self-pay

## 2020-01-23 DIAGNOSIS — Z20822 Contact with and (suspected) exposure to covid-19: Secondary | ICD-10-CM | POA: Diagnosis not present

## 2020-01-23 DIAGNOSIS — E872 Acidosis: Secondary | ICD-10-CM | POA: Diagnosis not present

## 2020-01-23 DIAGNOSIS — I2510 Atherosclerotic heart disease of native coronary artery without angina pectoris: Secondary | ICD-10-CM | POA: Diagnosis not present

## 2020-01-23 DIAGNOSIS — L97119 Non-pressure chronic ulcer of right thigh with unspecified severity: Secondary | ICD-10-CM | POA: Diagnosis not present

## 2020-01-23 DIAGNOSIS — Z95820 Peripheral vascular angioplasty status with implants and grafts: Secondary | ICD-10-CM | POA: Diagnosis not present

## 2020-01-23 DIAGNOSIS — N390 Urinary tract infection, site not specified: Secondary | ICD-10-CM | POA: Diagnosis present

## 2020-01-23 DIAGNOSIS — L02415 Cutaneous abscess of right lower limb: Secondary | ICD-10-CM | POA: Diagnosis not present

## 2020-01-23 DIAGNOSIS — Z01810 Encounter for preprocedural cardiovascular examination: Secondary | ICD-10-CM | POA: Diagnosis not present

## 2020-01-23 DIAGNOSIS — E1152 Type 2 diabetes mellitus with diabetic peripheral angiopathy with gangrene: Secondary | ICD-10-CM | POA: Diagnosis not present

## 2020-01-23 DIAGNOSIS — R404 Transient alteration of awareness: Secondary | ICD-10-CM | POA: Diagnosis not present

## 2020-01-23 DIAGNOSIS — Z8249 Family history of ischemic heart disease and other diseases of the circulatory system: Secondary | ICD-10-CM

## 2020-01-23 DIAGNOSIS — D638 Anemia in other chronic diseases classified elsewhere: Secondary | ICD-10-CM | POA: Diagnosis present

## 2020-01-23 DIAGNOSIS — N138 Other obstructive and reflux uropathy: Secondary | ICD-10-CM | POA: Diagnosis not present

## 2020-01-23 DIAGNOSIS — Z8619 Personal history of other infectious and parasitic diseases: Secondary | ICD-10-CM | POA: Diagnosis not present

## 2020-01-23 DIAGNOSIS — E785 Hyperlipidemia, unspecified: Secondary | ICD-10-CM | POA: Diagnosis not present

## 2020-01-23 DIAGNOSIS — Z515 Encounter for palliative care: Secondary | ICD-10-CM | POA: Diagnosis not present

## 2020-01-23 DIAGNOSIS — M86171 Other acute osteomyelitis, right ankle and foot: Secondary | ICD-10-CM | POA: Diagnosis not present

## 2020-01-23 DIAGNOSIS — Z79899 Other long term (current) drug therapy: Secondary | ICD-10-CM

## 2020-01-23 DIAGNOSIS — M2550 Pain in unspecified joint: Secondary | ICD-10-CM | POA: Diagnosis not present

## 2020-01-23 DIAGNOSIS — M6281 Muscle weakness (generalized): Secondary | ICD-10-CM | POA: Diagnosis not present

## 2020-01-23 DIAGNOSIS — Z4781 Encounter for orthopedic aftercare following surgical amputation: Secondary | ICD-10-CM | POA: Diagnosis not present

## 2020-01-23 DIAGNOSIS — M869 Osteomyelitis, unspecified: Secondary | ICD-10-CM

## 2020-01-23 DIAGNOSIS — E43 Unspecified severe protein-calorie malnutrition: Secondary | ICD-10-CM | POA: Diagnosis present

## 2020-01-23 DIAGNOSIS — S98211D Complete traumatic amputation of two or more right lesser toes, subsequent encounter: Secondary | ICD-10-CM | POA: Diagnosis not present

## 2020-01-23 DIAGNOSIS — I10 Essential (primary) hypertension: Secondary | ICD-10-CM | POA: Diagnosis present

## 2020-01-23 DIAGNOSIS — Z89421 Acquired absence of other right toe(s): Secondary | ICD-10-CM | POA: Diagnosis not present

## 2020-01-23 DIAGNOSIS — L03115 Cellulitis of right lower limb: Secondary | ICD-10-CM | POA: Diagnosis present

## 2020-01-23 DIAGNOSIS — T8141XA Infection following a procedure, superficial incisional surgical site, initial encounter: Principal | ICD-10-CM | POA: Diagnosis present

## 2020-01-23 DIAGNOSIS — F329 Major depressive disorder, single episode, unspecified: Secondary | ICD-10-CM | POA: Diagnosis present

## 2020-01-23 DIAGNOSIS — R319 Hematuria, unspecified: Secondary | ICD-10-CM | POA: Diagnosis not present

## 2020-01-23 DIAGNOSIS — A4901 Methicillin susceptible Staphylococcus aureus infection, unspecified site: Secondary | ICD-10-CM | POA: Diagnosis not present

## 2020-01-23 DIAGNOSIS — G9341 Metabolic encephalopathy: Secondary | ICD-10-CM | POA: Diagnosis not present

## 2020-01-23 DIAGNOSIS — I6529 Occlusion and stenosis of unspecified carotid artery: Secondary | ICD-10-CM | POA: Diagnosis not present

## 2020-01-23 DIAGNOSIS — M86071 Acute hematogenous osteomyelitis, right ankle and foot: Secondary | ICD-10-CM | POA: Diagnosis not present

## 2020-01-23 DIAGNOSIS — Z951 Presence of aortocoronary bypass graft: Secondary | ICD-10-CM | POA: Diagnosis not present

## 2020-01-23 DIAGNOSIS — Z7982 Long term (current) use of aspirin: Secondary | ICD-10-CM

## 2020-01-23 DIAGNOSIS — I96 Gangrene, not elsewhere classified: Secondary | ICD-10-CM | POA: Diagnosis not present

## 2020-01-23 DIAGNOSIS — Z87442 Personal history of urinary calculi: Secondary | ICD-10-CM | POA: Diagnosis present

## 2020-01-23 DIAGNOSIS — Y832 Surgical operation with anastomosis, bypass or graft as the cause of abnormal reaction of the patient, or of later complication, without mention of misadventure at the time of the procedure: Secondary | ICD-10-CM | POA: Diagnosis present

## 2020-01-23 DIAGNOSIS — I4891 Unspecified atrial fibrillation: Secondary | ICD-10-CM | POA: Diagnosis present

## 2020-01-23 DIAGNOSIS — R9082 White matter disease, unspecified: Secondary | ICD-10-CM | POA: Diagnosis not present

## 2020-01-23 DIAGNOSIS — Z66 Do not resuscitate: Secondary | ICD-10-CM

## 2020-01-23 DIAGNOSIS — A411 Sepsis due to other specified staphylococcus: Secondary | ICD-10-CM | POA: Diagnosis not present

## 2020-01-23 DIAGNOSIS — D696 Thrombocytopenia, unspecified: Secondary | ICD-10-CM | POA: Diagnosis not present

## 2020-01-23 DIAGNOSIS — I6381 Other cerebral infarction due to occlusion or stenosis of small artery: Secondary | ICD-10-CM | POA: Diagnosis not present

## 2020-01-23 DIAGNOSIS — R2689 Other abnormalities of gait and mobility: Secondary | ICD-10-CM | POA: Diagnosis not present

## 2020-01-23 DIAGNOSIS — R6520 Severe sepsis without septic shock: Secondary | ICD-10-CM | POA: Diagnosis present

## 2020-01-23 DIAGNOSIS — L02611 Cutaneous abscess of right foot: Secondary | ICD-10-CM | POA: Diagnosis not present

## 2020-01-23 DIAGNOSIS — B954 Other streptococcus as the cause of diseases classified elsewhere: Secondary | ICD-10-CM | POA: Diagnosis not present

## 2020-01-23 DIAGNOSIS — E876 Hypokalemia: Secondary | ICD-10-CM | POA: Diagnosis not present

## 2020-01-23 DIAGNOSIS — N401 Enlarged prostate with lower urinary tract symptoms: Secondary | ICD-10-CM | POA: Diagnosis present

## 2020-01-23 DIAGNOSIS — L97509 Non-pressure chronic ulcer of other part of unspecified foot with unspecified severity: Secondary | ICD-10-CM | POA: Diagnosis not present

## 2020-01-23 DIAGNOSIS — N320 Bladder-neck obstruction: Secondary | ICD-10-CM | POA: Diagnosis present

## 2020-01-23 DIAGNOSIS — R7302 Impaired glucose tolerance (oral): Secondary | ICD-10-CM | POA: Diagnosis not present

## 2020-01-23 DIAGNOSIS — L97419 Non-pressure chronic ulcer of right heel and midfoot with unspecified severity: Secondary | ICD-10-CM | POA: Diagnosis not present

## 2020-01-23 DIAGNOSIS — N200 Calculus of kidney: Secondary | ICD-10-CM | POA: Diagnosis not present

## 2020-01-23 DIAGNOSIS — E559 Vitamin D deficiency, unspecified: Secondary | ICD-10-CM | POA: Diagnosis not present

## 2020-01-23 DIAGNOSIS — J449 Chronic obstructive pulmonary disease, unspecified: Secondary | ICD-10-CM | POA: Diagnosis not present

## 2020-01-23 DIAGNOSIS — T827XXA Infection and inflammatory reaction due to other cardiac and vascular devices, implants and grafts, initial encounter: Secondary | ICD-10-CM

## 2020-01-23 DIAGNOSIS — Z95828 Presence of other vascular implants and grafts: Secondary | ICD-10-CM | POA: Diagnosis not present

## 2020-01-23 DIAGNOSIS — R4182 Altered mental status, unspecified: Secondary | ICD-10-CM | POA: Diagnosis present

## 2020-01-23 DIAGNOSIS — I739 Peripheral vascular disease, unspecified: Secondary | ICD-10-CM | POA: Diagnosis not present

## 2020-01-23 DIAGNOSIS — R000 Tachycardia, unspecified: Secondary | ICD-10-CM | POA: Diagnosis not present

## 2020-01-23 DIAGNOSIS — Z7401 Bed confinement status: Secondary | ICD-10-CM | POA: Diagnosis not present

## 2020-01-23 DIAGNOSIS — E11621 Type 2 diabetes mellitus with foot ulcer: Secondary | ICD-10-CM | POA: Diagnosis not present

## 2020-01-23 DIAGNOSIS — Z7189 Other specified counseling: Secondary | ICD-10-CM

## 2020-01-23 DIAGNOSIS — I701 Atherosclerosis of renal artery: Secondary | ICD-10-CM | POA: Diagnosis not present

## 2020-01-23 DIAGNOSIS — A419 Sepsis, unspecified organism: Secondary | ICD-10-CM | POA: Diagnosis present

## 2020-01-23 DIAGNOSIS — F17211 Nicotine dependence, cigarettes, in remission: Secondary | ICD-10-CM | POA: Diagnosis not present

## 2020-01-23 DIAGNOSIS — R410 Disorientation, unspecified: Secondary | ICD-10-CM | POA: Diagnosis not present

## 2020-01-23 DIAGNOSIS — Z7902 Long term (current) use of antithrombotics/antiplatelets: Secondary | ICD-10-CM

## 2020-01-23 DIAGNOSIS — I959 Hypotension, unspecified: Secondary | ICD-10-CM | POA: Diagnosis not present

## 2020-01-23 DIAGNOSIS — F17210 Nicotine dependence, cigarettes, uncomplicated: Secondary | ICD-10-CM | POA: Diagnosis present

## 2020-01-23 DIAGNOSIS — N529 Male erectile dysfunction, unspecified: Secondary | ICD-10-CM | POA: Diagnosis present

## 2020-01-23 DIAGNOSIS — Z743 Need for continuous supervision: Secondary | ICD-10-CM | POA: Diagnosis not present

## 2020-01-23 DIAGNOSIS — R509 Fever, unspecified: Secondary | ICD-10-CM | POA: Diagnosis not present

## 2020-01-23 DIAGNOSIS — I723 Aneurysm of iliac artery: Secondary | ICD-10-CM | POA: Diagnosis not present

## 2020-01-23 DIAGNOSIS — A4101 Sepsis due to Methicillin susceptible Staphylococcus aureus: Secondary | ICD-10-CM | POA: Diagnosis not present

## 2020-01-23 DIAGNOSIS — Z9862 Peripheral vascular angioplasty status: Secondary | ICD-10-CM | POA: Diagnosis not present

## 2020-01-23 DIAGNOSIS — R918 Other nonspecific abnormal finding of lung field: Secondary | ICD-10-CM | POA: Diagnosis not present

## 2020-01-23 DIAGNOSIS — T8140XA Infection following a procedure, unspecified, initial encounter: Secondary | ICD-10-CM | POA: Diagnosis not present

## 2020-01-23 LAB — URINALYSIS, ROUTINE W REFLEX MICROSCOPIC
Bacteria, UA: NONE SEEN
Bilirubin Urine: NEGATIVE
Glucose, UA: NEGATIVE mg/dL
Ketones, ur: NEGATIVE mg/dL
Leukocytes,Ua: NEGATIVE
Nitrite: NEGATIVE
Protein, ur: 100 mg/dL — AB
RBC / HPF: >50 RBC/hpf — ABNORMAL HIGH (ref 0–5)
Specific Gravity, Urine: 1.018 (ref 1.005–1.030)
pH: 6 (ref 5.0–8.0)

## 2020-01-23 LAB — LACTIC ACID, PLASMA
Lactic Acid, Venous: 4.1 mmol/L (ref 0.5–1.9)
Lactic Acid, Venous: 3 mmol/L (ref 0.5–1.9)
Lactic Acid, Venous: 1.6 mmol/L (ref 0.5–1.9)

## 2020-01-23 LAB — RPR: RPR Ser Ql: NONREACTIVE

## 2020-01-23 LAB — COMPREHENSIVE METABOLIC PANEL
ALT: 11 U/L (ref 0–44)
AST: 24 U/L (ref 15–41)
Albumin: 3.6 g/dL (ref 3.5–5.0)
Alkaline Phosphatase: 78 U/L (ref 38–126)
Anion gap: 14 (ref 5–15)
BUN: 16 mg/dL (ref 8–23)
CO2: 26 mmol/L (ref 22–32)
Calcium: 9 mg/dL (ref 8.9–10.3)
Chloride: 95 mmol/L — ABNORMAL LOW (ref 98–111)
Creatinine, Ser: 1.03 mg/dL (ref 0.61–1.24)
GFR calc Af Amer: >60 mL/min (ref >60)
GFR calc non Af Amer: >60 mL/min (ref >60)
Glucose, Bld: 171 mg/dL — ABNORMAL HIGH (ref 70–99)
Potassium: 3.1 mmol/L — ABNORMAL LOW (ref 3.5–5.1)
Sodium: 135 mmol/L (ref 135–145)
Total Bilirubin: 0.9 mg/dL (ref 0.3–1.2)
Total Protein: 8 g/dL (ref 6.5–8.1)

## 2020-01-23 LAB — CBC WITH DIFFERENTIAL/PLATELET
Abs Immature Granulocytes: 0.06 10*3/uL (ref 0.00–0.07)
Basophils Absolute: 0 10*3/uL (ref 0.0–0.1)
Basophils Relative: 0 %
Eosinophils Absolute: 0 10*3/uL (ref 0.0–0.5)
Eosinophils Relative: 0 %
HCT: 41.5 % (ref 39.0–52.0)
Hemoglobin: 13.5 g/dL (ref 13.0–17.0)
Immature Granulocytes: 1 %
Lymphocytes Relative: 5 %
Lymphs Abs: 0.6 10*3/uL — ABNORMAL LOW (ref 0.7–4.0)
MCH: 28.7 pg (ref 26.0–34.0)
MCHC: 32.5 g/dL (ref 30.0–36.0)
MCV: 88.1 fL (ref 80.0–100.0)
Monocytes Absolute: 0.8 10*3/uL (ref 0.1–1.0)
Monocytes Relative: 6 %
Neutro Abs: 10.8 10*3/uL — ABNORMAL HIGH (ref 1.7–7.7)
Neutrophils Relative %: 88 %
Platelets: 125 10*3/uL — ABNORMAL LOW (ref 150–400)
RBC: 4.71 MIL/uL (ref 4.22–5.81)
RDW: 14.8 % (ref 11.5–15.5)
WBC: 12.2 10*3/uL — ABNORMAL HIGH (ref 4.0–10.5)
nRBC: 0 % (ref 0.0–0.2)

## 2020-01-23 LAB — PROTIME-INR
INR: 1.2 (ref 0.8–1.2)
Prothrombin Time: 15.5 seconds — ABNORMAL HIGH (ref 11.4–15.2)

## 2020-01-23 LAB — CBC
HCT: 34.4 % — ABNORMAL LOW (ref 39.0–52.0)
Hemoglobin: 11 g/dL — ABNORMAL LOW (ref 13.0–17.0)
MCH: 28.4 pg (ref 26.0–34.0)
MCHC: 32 g/dL (ref 30.0–36.0)
MCV: 88.9 fL (ref 80.0–100.0)
Platelets: 99 10*3/uL — ABNORMAL LOW (ref 150–400)
RBC: 3.87 MIL/uL — ABNORMAL LOW (ref 4.22–5.81)
RDW: 14.9 % (ref 11.5–15.5)
WBC: 9.8 10*3/uL (ref 4.0–10.5)
nRBC: 0 % (ref 0.0–0.2)

## 2020-01-23 LAB — CK: Total CK: 502 U/L — ABNORMAL HIGH (ref 49–397)

## 2020-01-23 LAB — RESPIRATORY PANEL BY RT PCR (FLU A&B, COVID)
Influenza A by PCR: NEGATIVE
Influenza B by PCR: NEGATIVE
SARS Coronavirus 2 by RT PCR: NEGATIVE

## 2020-01-23 LAB — HEMOGLOBIN A1C
Hgb A1c MFr Bld: 5.7 % — ABNORMAL HIGH (ref 4.8–5.6)
Mean Plasma Glucose: 116.89 mg/dL

## 2020-01-23 LAB — MRSA PCR SCREENING: MRSA by PCR: NEGATIVE

## 2020-01-23 LAB — VITAMIN B12: Vitamin B-12: 166 pg/mL — ABNORMAL LOW (ref 180–914)

## 2020-01-23 LAB — TSH: TSH: 1.558 u[IU]/mL (ref 0.350–4.500)

## 2020-01-23 LAB — APTT: aPTT: 39 seconds — ABNORMAL HIGH (ref 24–36)

## 2020-01-23 LAB — HIV ANTIBODY (ROUTINE TESTING W REFLEX): HIV Screen 4th Generation wRfx: NONREACTIVE

## 2020-01-23 LAB — SEDIMENTATION RATE: Sed Rate: 15 mm/hr (ref 0–16)

## 2020-01-23 LAB — FOLATE: Folate: 15 ng/mL (ref >5.9)

## 2020-01-23 MED ORDER — ONDANSETRON HCL 4 MG PO TABS: 4.0000 mg | ORAL_TABLET | Freq: Four times a day (QID) | ORAL | Status: DC | PRN

## 2020-01-23 MED ORDER — LACTATED RINGERS IV SOLN: INTRAVENOUS | Status: DC

## 2020-01-23 MED ORDER — POTASSIUM CHLORIDE 10 MEQ/100ML IV SOLN
10.0000 meq | Freq: Once | INTRAVENOUS | Status: DC
  Filled 2020-01-23: qty 100

## 2020-01-23 MED ORDER — ONDANSETRON HCL 4 MG/2ML IJ SOLN
4.0000 mg | Freq: Four times a day (QID) | INTRAMUSCULAR | Status: DC | PRN
  Administered 2020-01-27: 13:00:00 4 mg via INTRAVENOUS
  Filled 2020-01-23: qty 2

## 2020-01-23 MED ORDER — ROSUVASTATIN CALCIUM 20 MG PO TABS
20.0000 mg | ORAL_TABLET | Freq: Every day | ORAL | Status: DC
  Administered 2020-01-24 – 2020-01-30 (×7): 20 mg via ORAL
  Filled 2020-01-23 (×8): qty 1

## 2020-01-23 MED ORDER — METRONIDAZOLE IN NACL 5-0.79 MG/ML-% IV SOLN
500.0000 mg | Freq: Once | INTRAVENOUS | Status: AC
  Administered 2020-01-23: 02:00:00 500 mg via INTRAVENOUS
  Filled 2020-01-23: qty 100

## 2020-01-23 MED ORDER — ACETAMINOPHEN 500 MG PO TABS
1000.0000 mg | ORAL_TABLET | Freq: Once | ORAL | Status: AC
  Administered 2020-01-23: 02:00:00 1000 mg via ORAL
  Filled 2020-01-23: qty 2

## 2020-01-23 MED ORDER — CLOPIDOGREL BISULFATE 75 MG PO TABS
75.0000 mg | ORAL_TABLET | Freq: Every day | ORAL | Status: DC
  Administered 2020-01-24 – 2020-01-30 (×7): 75 mg via ORAL
  Filled 2020-01-23 (×8): qty 1

## 2020-01-23 MED ORDER — ACETAMINOPHEN 325 MG PO TABS
650.0000 mg | ORAL_TABLET | ORAL | Status: DC | PRN
  Administered 2020-01-24 – 2020-01-25 (×3): 650 mg via ORAL
  Filled 2020-01-23 (×3): qty 2

## 2020-01-23 MED ORDER — POTASSIUM CHLORIDE IN NACL 20-0.9 MEQ/L-% IV SOLN
INTRAVENOUS | Status: DC
  Filled 2020-01-23 (×3): qty 1000

## 2020-01-23 MED ORDER — AMLODIPINE BESYLATE 5 MG PO TABS
2.5000 mg | ORAL_TABLET | Freq: Every day | ORAL | Status: DC
  Administered 2020-01-24 – 2020-01-30 (×7): 2.5 mg via ORAL
  Filled 2020-01-23 (×7): qty 1

## 2020-01-23 MED ORDER — VANCOMYCIN HCL 750 MG/150ML IV SOLN
750.0000 mg | Freq: Two times a day (BID) | INTRAVENOUS | Status: DC
  Administered 2020-01-23 – 2020-01-24 (×2): 750 mg via INTRAVENOUS
  Filled 2020-01-23 (×6): qty 150

## 2020-01-23 MED ORDER — LACTATED RINGERS IV BOLUS
1000.0000 mL | Freq: Once | INTRAVENOUS | Status: AC
  Administered 2020-01-23: 02:00:00 1000 mL via INTRAVENOUS

## 2020-01-23 MED ORDER — LACTATED RINGERS IV BOLUS
500.0000 mL | Freq: Once | INTRAVENOUS | Status: AC
  Administered 2020-01-23: 500 mL via INTRAVENOUS

## 2020-01-23 MED ORDER — SUCRALFATE 1 G PO TABS
1.0000 g | ORAL_TABLET | Freq: Three times a day (TID) | ORAL | Status: DC
  Administered 2020-01-23 – 2020-01-30 (×23): 1 g via ORAL
  Filled 2020-01-23 (×25): qty 1

## 2020-01-23 MED ORDER — SODIUM CHLORIDE 0.9 % IV SOLN
2.0000 g | Freq: Once | INTRAVENOUS | Status: AC
  Administered 2020-01-23: 02:00:00 2 g via INTRAVENOUS
  Filled 2020-01-23: qty 2

## 2020-01-23 MED ORDER — LACTATED RINGERS IV BOLUS
1000.0000 mL | Freq: Once | INTRAVENOUS | Status: AC
  Administered 2020-01-23: 1000 mL via INTRAVENOUS

## 2020-01-23 MED ORDER — LORAZEPAM 2 MG/ML IJ SOLN
0.5000 mg | INTRAMUSCULAR | Status: DC | PRN
  Administered 2020-01-23 – 2020-01-25 (×2): 0.5 mg via INTRAVENOUS
  Filled 2020-01-23 (×2): qty 1

## 2020-01-23 MED ORDER — POTASSIUM CHLORIDE 10 MEQ/100ML IV SOLN
10.0000 meq | INTRAVENOUS | Status: AC
  Administered 2020-01-23 (×2): 10 meq via INTRAVENOUS
  Filled 2020-01-23 (×2): qty 100

## 2020-01-23 MED ORDER — KETOROLAC TROMETHAMINE 15 MG/ML IJ SOLN
15.0000 mg | Freq: Four times a day (QID) | INTRAMUSCULAR | Status: AC | PRN
  Administered 2020-01-23 – 2020-01-25 (×3): 15 mg via INTRAVENOUS
  Filled 2020-01-23 (×3): qty 1

## 2020-01-23 MED ORDER — SODIUM CHLORIDE 0.9 % IV SOLN
2.0000 g | Freq: Three times a day (TID) | INTRAVENOUS | Status: DC
  Administered 2020-01-23 – 2020-01-28 (×14): 2 g via INTRAVENOUS
  Filled 2020-01-23 (×18): qty 2

## 2020-01-23 MED ORDER — VANCOMYCIN HCL IN DEXTROSE 1-5 GM/200ML-% IV SOLN
1000.0000 mg | Freq: Once | INTRAVENOUS | Status: AC
  Administered 2020-01-23: 1000 mg via INTRAVENOUS
  Filled 2020-01-23: qty 200

## 2020-01-23 MED ORDER — ASPIRIN EC 81 MG PO TBEC
81.0000 mg | DELAYED_RELEASE_TABLET | Freq: Every day | ORAL | Status: DC
  Administered 2020-01-24 – 2020-01-30 (×7): 81 mg via ORAL
  Filled 2020-01-23 (×11): qty 1

## 2020-01-23 NOTE — H&P (Addendum)
TRH H&P    Patient Demographics:    Raymond Walker, is a 69 y.o. male  MRN: [id number]  DOB - 12/06/1951  Admit Date - 01/23/2020  Referring MD/NP/PA: Dorise Bullion  Outpatient Primary MD for the patient is [name], MD  Patient coming from: Home  Chief complaint-altered mental status   HPI:    Raymond Walker  is a 69 y.o. male, with history of COPD, hypertension, thrombocytopenia, s/p right femoral to above-knee pop bypass with toe amputation in October 2020 was brought to hospital with altered mental status.  Apparently neighbor found him on the couch, not moving, confused, incontinent of urine and feces.  Patient's daughter had not seen him in a week. In the ED he was agitated so he was given Ativan. Patient was found to have bleeding from the right toe amputation site, lactic acid was 4.1.  Patient was started on vancomycin and cefepime empirically for sepsis. No other history is obtainable.    Review of systems:    In addition to the HPI above,   All other systems reviewed and are negative.    Past History of the following :    Past Medical History:  Diagnosis Date  . COPD (chronic obstructive pulmonary disease) ([place])   . Erectile dysfunction   . History of kidney stones   . Hypertension   . Impaired glucose tolerance   . Pancreatitis, recurrent   . Thrombocytopenia ([place]) 08/10/2018   Staying in the low 100s will follow closely      Past Surgical History:  Procedure Laterality Date  . ABDOMINAL AORTOGRAM W/LOWER EXTREMITY Bilateral 08/20/2019   Procedure: ABDOMINAL AORTOGRAM W/LOWER EXTREMITY;  Surgeon: Marty Heck, MD;  Location: [facility];  Service: Cardiovascular;  Laterality: Bilateral;  . AMPUTATION Right 09/15/2019   Procedure: AMPUTATION RIGHT TOES One, Two, And Three;  Surgeon: Waynetta Sandy, MD;  Location: [facility];  Service: Vascular;  Laterality:  Right;  . CATARACT EXTRACTION W/PHACO Right 05/02/2018   Procedure: CATARACT EXTRACTION PHACO AND INTRAOCULAR LENS PLACEMENT (IOC);  Surgeon: Baruch Goldmann, MD;  Location: [facility];  Service: Ophthalmology;  Laterality: Right;  CDE: 11.11  . CATARACT EXTRACTION W/PHACO Left 07/04/2018   Procedure: CATARACT EXTRACTION PHACO AND INTRAOCULAR LENS PLACEMENT (IOC);  Surgeon: Baruch Goldmann, MD;  Location: [facility];  Service: Ophthalmology;  Laterality: Left;  CDE: 7.15  . CHOLECYSTECTOMY    . FEMORAL-POPLITEAL BYPASS GRAFT Right 09/15/2019   Procedure: Right BYPASS GRAFT FEMORAL to Above Knee POPLITEAL ARTERY;  Surgeon: Waynetta Sandy, MD;  Location: [facility];  Service: Vascular;  Laterality: Right;  . ORCHIECTOMY    . PERIPHERAL VASCULAR INTERVENTION Left 08/20/2019   Procedure: PERIPHERAL VASCULAR INTERVENTION;  Surgeon: Marty Heck, MD;  Location: [facility];  Service: Cardiovascular;  Laterality: Left;  common/external iliac  . VASECTOMY        Social History:      Social History   Tobacco Use  . Smoking status: Current Every Day Smoker    Packs/day: 1.50  Types: Cigarettes  . Smokeless tobacco: Never Used  . Tobacco comment: stopped on admission to hospital on 08/19/19  Substance Use Topics  . Alcohol use: No       Family History :     Family History  Problem Relation Age of Onset  . Hypertension Father   . Coronary artery disease Father   . Coronary artery disease Mother   . Cancer Mother        Breast      Home Medications:   Prior to Admission medications   Medication Sig Start Date End Date Taking? Authorizing Provider  acetaminophen (TYLENOL) 500 MG tablet Take 500 mg by mouth every 8 (eight) hours as needed for moderate pain.     [provider]  aspirin EC 81 MG EC tablet Take 1 tablet (81 mg total) by mouth daily. 08/29/19   Regalado, Jerald Kief A, MD  clopidogrel (PLAVIX) 75 MG tablet Take 1 tablet (75 mg total) by mouth daily with  breakfast. 11/17/19   [name], MD  feeding supplement, ENSURE ENLIVE, (ENSURE ENLIVE) LIQD Take 237 mLs by mouth 2 (two) times daily between meals. 08/28/19   Regalado, Belkys A, MD  Multiple Vitamin (MULTIVITAMIN WITH MINERALS) TABS tablet Take 1 tablet by mouth daily. 08/29/19   Regalado, Belkys A, MD  nicotine (NICODERM CQ - DOSED IN MG/24 HOURS) 21 mg/24hr patch Place 1 patch (21 mg total) onto the skin daily as needed (For nicotine withdrawal symptoms.). 08/28/19   Regalado, Belkys A, MD  nutrition supplement, JUVEN, (JUVEN) PACK Take 1 packet by mouth 2 (two) times daily between meals. 08/28/19   Regalado, Belkys A, MD  ondansetron (ZOFRAN ODT) 8 MG disintegrating tablet Take 1 tablet (8 mg total) by mouth every 8 (eight) hours as needed for nausea or vomiting. 10/28/19   [name], MD  pantoprazole (PROTONIX) 40 MG tablet Take 1 tablet (40 mg total) by mouth daily. 11/23/19   [name], MD  polyethylene glycol (MIRALAX / GLYCOLAX) 17 g packet Take 17 g by mouth daily. 08/29/19   Regalado, Belkys A, MD  rosuvastatin (CRESTOR) 20 MG tablet Take 1 tablet (20 mg total) by mouth daily. 11/17/19   [name], MD  senna (SENOKOT) 8.6 MG TABS tablet Take 1 tablet (8.6 mg total) by mouth at bedtime. 08/28/19   Regalado, Jerald Kief A, MD  sildenafil (VIAGRA) 100 MG tablet Take one tablet po daily prn sexual relations 11/23/19   Sallee Lange A, MD  sucralfate (CARAFATE) 1 g tablet TAKE 1 TABLET(1 GRAM) BY MOUTH FOUR TIMES DAILY WITH MEALS AND AT BEDTIME 12/28/19   [name], MD  traZODone (DESYREL) 50 MG tablet Take 0.5-1 tablets (25-50 mg total) by mouth at bedtime as needed for sleep. 10/26/19   [name], MD     Allergies:    No Known Allergies   Physical Exam:   Vitals  Blood pressure 112/72, pulse 90, temperature 99 F (37.2 C), temperature source Axillary, resp. rate (!) 34, height 5\' 10"  (1.778 m), weight 65.8 kg, SpO2 94 %.  1.  General: Sedated after Ativan  2.  Psychiatric: Not tested  3. Neurologic: Not tested, somnolent  4. HEENMT:  Atraumatic normocephalic  5. Respiratory : Clear to auscultation bilaterally, no wheezing or crackles auscultated  6. Cardiovascular : S1-S2, regular  7. Gastrointestinal:  Abdomen is soft, nontender  8. Skin:  Erythema and warmth noted on right thigh  9.Musculoskeletal:  S/p amputation of right  big toe, dried blood noted on the amputation site, peripheral pulses palpable on right by Doppler, see note by ED physician    Data Review:    CBC Recent Labs  Lab 01/23/20 0118  WBC 12.2*  HGB 13.5  HCT 41.5  PLT 125*  MCV 88.1  MCH 28.7  MCHC 32.5  RDW 14.8  LYMPHSABS 0.6*  MONOABS 0.8  EOSABS 0.0  BASOSABS 0.0   ------------------------------------------------------------------------------------------------------------------  Results for orders placed or performed during the hospital encounter of 01/23/20 (from the past 48 hour(s))  Lactic acid, plasma     Status: Abnormal   Collection Time: 01/23/20  1:18 AM  Result Value Ref Range   Lactic Acid, Venous 4.1 (HH) 0.5 - 1.9 mmol/L    Comment: CRITICAL RESULT CALLED TO, READ BACK BY AND VERIFIED WITH: DOSS,M @ 0149 ON 01/23/20 BY JUW Performed at [facility], [address], [place], [place] [address]   Comprehensive metabolic panel     Status: Abnormal   Collection Time: 01/23/20  1:18 AM  Result Value Ref Range   Sodium 135 135 - 145 mmol/L   Potassium 3.1 (L) 3.5 - 5.1 mmol/L   Chloride 95 (L) 98 - 111 mmol/L   CO2 26 22 - 32 mmol/L   Glucose, Bld 171 (H) 70 - 99 mg/dL   BUN 16 8 - 23 mg/dL   Creatinine, Ser 1.03 0.61 - 1.24 mg/dL   Calcium 9.0 8.9 - 10.3 mg/dL   Total Protein 8.0 6.5 - 8.1 g/dL   Albumin 3.6 3.5 - 5.0 g/dL   AST 24 15 - 41 U/L   ALT 11 0 - 44 U/L   Alkaline Phosphatase 78 38 - 126 U/L   Total Bilirubin 0.9 0.3 - 1.2 mg/dL   GFR calc non Af Amer >60 >60 mL/min   GFR calc Af Amer >60 >60 mL/min   Anion gap 14 5  - 15    Comment: Performed at [facility], [address], [place], [place] [address]  CBC WITH DIFFERENTIAL     Status: Abnormal   Collection Time: 01/23/20  1:18 AM  Result Value Ref Range   WBC 12.2 (H) 4.0 - 10.5 K/uL   RBC 4.71 4.22 - 5.81 MIL/uL   Hemoglobin 13.5 13.0 - 17.0 g/dL   HCT 41.5 39.0 - 52.0 %   MCV 88.1 80.0 - 100.0 fL   MCH 28.7 26.0 - 34.0 pg   MCHC 32.5 30.0 - 36.0 g/dL   RDW 14.8 11.5 - 15.5 %   Platelets 125 (L) 150 - 400 K/uL   nRBC 0.0 0.0 - 0.2 %   Neutrophils Relative % 88 %   Neutro Abs 10.8 (H) 1.7 - 7.7 K/uL   Lymphocytes Relative 5 %   Lymphs Abs 0.6 (L) 0.7 - 4.0 K/uL   Monocytes Relative 6 %   Monocytes Absolute 0.8 0.1 - 1.0 K/uL   Eosinophils Relative 0 %   Eosinophils Absolute 0.0 0.0 - 0.5 K/uL   Basophils Relative 0 %   Basophils Absolute 0.0 0.0 - 0.1 K/uL   Immature Granulocytes 1 %   Abs Immature Granulocytes 0.06 0.00 - 0.07 K/uL    Comment: Performed at [facility], [address], [place], [place] [address]  APTT     Status: Abnormal   Collection Time: 01/23/20  1:18 AM  Result Value Ref Range   aPTT 39 (H) 24 - 36 seconds    Comment:  IF BASELINE aPTT IS ELEVATED, SUGGEST PATIENT RISK ASSESSMENT BE USED TO DETERMINE APPROPRIATE ANTICOAGULANT THERAPY. Performed at [facility], [address], [place], [place] [address]   Protime-INR     Status: Abnormal   Collection Time: 01/23/20  1:18 AM  Result Value Ref Range   Prothrombin Time 15.5 (H) 11.4 - 15.2 seconds   INR 1.2 0.8 - 1.2    Comment: (NOTE) INR goal varies based on device and disease states. Performed at [facility], [address], [place], [place] [address]   CK     Status: Abnormal   Collection Time: 01/23/20  1:18 AM  Result Value Ref Range   Total CK 502 (H) 49 - 397 U/L    Comment: Performed at [facility], [address] [place], [place] [address]  Blood Culture (routine x 2)     Status: None (Preliminary result)   Collection Time: 01/23/20  1:19  AM   Specimen: Blood  Result Value Ref Range   Specimen Description RIGHT ANTECUBITAL    Special Requests      BOTTLES DRAWN AEROBIC AND ANAEROBIC Blood Culture adequate volume Performed at [facility], [address] [place], [place] [address]    Culture PENDING    Report Status PENDING   Blood Culture (routine x 2)     Status: None (Preliminary result)   Collection Time: 01/23/20  1:23 AM   Specimen: Blood  Result Value Ref Range   Specimen Description LEFT ANTECUBITAL    Special Requests      BOTTLES DRAWN AEROBIC AND ANAEROBIC Blood Culture adequate volume Performed at [facility], [address], [place], [place] [address]    Culture PENDING    Report Status PENDING   Respiratory Panel by RT PCR (Flu A&B, Covid) - Nasopharyngeal Swab     Status: None   Collection Time: 01/23/20  2:00 AM   Specimen: Nasopharyngeal Swab  Result Value Ref Range   SARS Coronavirus 2 by RT PCR NEGATIVE NEGATIVE    Comment: (NOTE) SARS-CoV-2 target nucleic acids are NOT DETECTED. The SARS-CoV-2 RNA is generally detectable in upper respiratoy specimens during the acute phase of infection. The lowest concentration of SARS-CoV-2 viral copies this assay can detect is 131 copies/mL. A negative result does not preclude SARS-Cov-2 infection and should not be used as the sole basis for treatment or other patient management decisions. A negative result may occur with  improper specimen collection/handling, submission of specimen other than nasopharyngeal swab, presence of viral mutation(s) within the areas targeted by this assay, and inadequate number of viral copies (<131 copies/mL). A negative result must be combined with clinical observations, patient history, and epidemiological information. The expected result is Negative. Fact Sheet for Patients:  PinkCheek.be Fact Sheet for Healthcare Providers:  GravelBags.it This test is not yet ap  proved or cleared by the [place] FDA and  has been authorized for detection and/or diagnosis of SARS-CoV-2 by FDA under an Emergency Use Authorization (EUA). This EUA will remain  in effect (meaning this test can be used) for the duration of the COVID-19 declaration under Section 564(b)(1) of the Act, 21 U.S.C. section 360bbb-3(b)(1), unless the authorization is terminated or revoked sooner.    Influenza A by PCR NEGATIVE NEGATIVE   Influenza B by PCR NEGATIVE NEGATIVE    Comment: (NOTE) The Xpert Xpress SARS-CoV-2/FLU/RSV assay is intended as an aid in  the diagnosis of influenza from Nasopharyngeal swab specimens and  should not be used as a sole  basis for treatment. Nasal washings and  aspirates are unacceptable for Xpert Xpress SARS-CoV-2/FLU/RSV  testing. Fact Sheet for Patients: PinkCheek.be Fact Sheet for Healthcare Providers: GravelBags.it This test is not yet approved or cleared by the [place] FDA and  has been authorized for detection and/or diagnosis of SARS-CoV-2 by  FDA under an Emergency Use Authorization (EUA). This EUA will remain  in effect (meaning this test can be used) for the duration of the  Covid-19 declaration under Section 564(b)(1) of the Act, 21  U.S.C. section 360bbb-3(b)(1), unless the authorization is  terminated or revoked. Performed at [facility], [address] [place], [place] [address]   Urinalysis, Routine w reflex microscopic     Status: Abnormal   Collection Time: 01/23/20  2:30 AM  Result Value Ref Range   Color, Urine YELLOW YELLOW   APPearance HAZY (A) CLEAR   Specific Gravity, Urine 1.018 1.005 - 1.030   pH 6.0 5.0 - 8.0   Glucose, UA NEGATIVE NEGATIVE mg/dL   Hgb urine dipstick LARGE (A) NEGATIVE   Bilirubin Urine NEGATIVE NEGATIVE   Ketones, ur NEGATIVE NEGATIVE mg/dL   Protein, ur 100 (A) NEGATIVE mg/dL   Nitrite NEGATIVE NEGATIVE   Leukocytes,Ua NEGATIVE  NEGATIVE   RBC / HPF >50 (H) 0 - 5 RBC/hpf   WBC, UA 21-50 0 - 5 WBC/hpf   Bacteria, UA NONE SEEN NONE SEEN   Squamous Epithelial / LPF 0-5 0 - 5   Mucus PRESENT    Budding Yeast PRESENT     Comment: Performed at [facility], [address], [place], [place] [address]    Chemistries  Recent Labs  Lab 01/23/20 0118  NA 135  K 3.1*  CL 95*  CO2 26  GLUCOSE 171*  BUN 16  CREATININE 1.03  CALCIUM 9.0  AST 24  ALT 11  ALKPHOS 78  BILITOT 0.9   ------------------------------------------------------------------------------------------------------------------  ------------------------------------------------------------------------------------------------------------------ GFR: Estimated Creatinine Clearance: 63 mL/min (by C-G formula based on SCr of 1.03 mg/dL). Liver Function Tests: Recent Labs  Lab 01/23/20 0118  AST 24  ALT 11  ALKPHOS 78  BILITOT 0.9  PROT 8.0  ALBUMIN 3.6   No results for input(s): LIPASE, AMYLASE in the last 168 hours. No results for input(s): AMMONIA in the last 168 hours. Coagulation Profile: Recent Labs  Lab 01/23/20 0118  INR 1.2   Cardiac Enzymes: Recent Labs  Lab 01/23/20 0118  CKTOTAL 502*   BNP (last 3 results) No results for input(s): PROBNP in the last 8760 hours. HbA1C: No results for input(s): HGBA1C in the last 72 hours. CBG: No results for input(s): GLUCAP in the last 168 hours. Lipid Profile: No results for input(s): CHOL, HDL, LDLCALC, TRIG, CHOLHDL, LDLDIRECT in the last 72 hours. Thyroid Function Tests: No results for input(s): TSH, T4TOTAL, FREET4, T3FREE, THYROIDAB in the last 72 hours. Anemia Panel: No results for input(s): VITAMINB12, FOLATE, FERRITIN, TIBC, IRON, RETICCTPCT in the last 72 hours.  --------------------------------------------------------------------------------------------------------------- Urine analysis:    Component Value Date/Time   COLORURINE YELLOW 01/23/2020 0230   APPEARANCEUR  HAZY (A) 01/23/2020 0230   LABSPEC 1.018 01/23/2020 0230   PHURINE 6.0 01/23/2020 0230   GLUCOSEU NEGATIVE 01/23/2020 0230   HGBUR LARGE (A) 01/23/2020 0230   BILIRUBINUR NEGATIVE 01/23/2020 0230   BILIRUBINUR + 10/29/2019 1338   KETONESUR NEGATIVE 01/23/2020 0230   PROTEINUR 100 (A) 01/23/2020 0230   NITRITE NEGATIVE 01/23/2020 0230   LEUKOCYTESUR NEGATIVE 01/23/2020 0230      Imaging Results:  DG Chest Port 1 View  Result Date: 01/23/2020 CLINICAL DATA:  Change in mental status EXAM: PORTABLE CHEST 1 VIEW COMPARISON:  August 19, 2019 FINDINGS: The heart size and mediastinal contours are within normal limits. Both lungs are clear. The visualized skeletal structures are unremarkable. IMPRESSION: No active disease. Electronically Signed   By: Prudencio Pair M.D.   On: 01/23/2020 01:49    My personal review of EKG: Rhythm -atrial fibrillation   Assessment & Plan:    Active Problems:   Sepsis ([place])   1. Sepsis-likely from underlying cellulitis of right thigh versus UTI.  Patient has abnormal UA.  Patient started on vancomycin and cefepime, will continue with antibiotics.  Follow blood culture, urine culture results.  Lactic acid 4.1, will repeat lactic acid every 2 hours. 2. Altered mental status-likely from above.  Patient is currently sedated as he got agitated and required Ativan. 3. Hypokalemia-potassium was 3.1, will replace potassium.  Follow BMP in a.m. 4. S/p right femoropopliteal bypass-patient was bleeding from the right toe.  At this time bleeding has stopped.  Will monitor. 5. New onset atrial fibrillation-patient EKG shows A. fib, heart rate is controlled. CHA2DS2VASc score is at least 2.  Will avoid anticoagulation at this time, considering he was bleeding from the right foot.  He also has history of thrombocytopenia, platelet count is 1 25,000 today    DVT Prophylaxis-   SCDs   AM Labs Ordered, also please review Full Orders  Family Communication: No family at  bedside  Code Status: Full code, presumed  Admission status: Inpatient: Based on patients clinical presentation and evaluation of above clinical data, I have made determination that patient meets Inpatient criteria at this time.  Time spent in minutes : 60 minutes   Brandom Kerwin S Zayaan Kozak M.D

## 2020-01-23 NOTE — ED Provider Notes (Signed)
Emergency Department Provider Note   I have reviewed the triage vital signs and the nursing notes.   HISTORY  Chief Complaint Altered Mental Status   HPI Raymond Walker. is a 69 y.o. male who presents here with altered mental status. Patient is a poor historian. Apparently neighbor found him on the cough, not moving, confused and incontinent of urine and feces.    No other associated or modifying symptoms.   Discussed with daughter, Raymond Walker, who states he was fine up until a couple days ago. Hasn't seen him in a week.   Level V Caveat 2/2 altered mental status.   Past Medical History:  Diagnosis Date  . COPD (chronic obstructive pulmonary disease) ([place])   . Erectile dysfunction   . History of kidney stones   . Hypertension   . Impaired glucose tolerance   . Pancreatitis, recurrent   . Thrombocytopenia ([place]) 08/10/2018   Staying in the low 100s will follow closely    Patient Active Problem List   Diagnosis Date Noted  . Sepsis ([place]) 01/23/2020  . PAD (peripheral artery disease) ([place]) 09/15/2019  . Protein-calorie malnutrition, severe 08/21/2019  . MSSA bacteremia 08/21/2019  . Acute osteomyelitis of toe of right foot ([place])   . Cellulitis of right lower extremity   . 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[place]) 08/19/2019  . Impaired glucose tolerance   . COPD (chronic obstructive pulmonary disease) ([place])   . Thrombocytopenia ([place]) 08/10/2018  . Aortic atherosclerosis ([place]) 10/14/2016  . Coronary artery calcification seen on CAT scan 10/14/2016  . Other emphysema ([place]) 10/14/2016  . Elevated blood pressure 12/03/2011  . Chest pain 12/03/2011  . Tobacco abuse 12/03/2011  . Right leg pain 12/03/2011    Past Surgical History:  Procedure Laterality Date  . ABDOMINAL AORTOGRAM W/LOWER EXTREMITY Bilateral 08/20/2019   Procedure: ABDOMINAL AORTOGRAM W/LOWER EXTREMITY;  Surgeon: Marty Heck, MD;  Location: [facility];  Service: Cardiovascular;   Laterality: Bilateral;  . AMPUTATION Right 09/15/2019   Procedure: AMPUTATION RIGHT TOES One, Two, And Three;  Surgeon: Waynetta Sandy, MD;  Location: [facility];  Service: Vascular;  Laterality: Right;  . CATARACT EXTRACTION W/PHACO Right 05/02/2018   Procedure: CATARACT EXTRACTION PHACO AND INTRAOCULAR LENS PLACEMENT (IOC);  Surgeon: Baruch Goldmann, MD;  Location: [facility];  Service: Ophthalmology;  Laterality: Right;  CDE: 11.11  . CATARACT EXTRACTION W/PHACO Left 07/04/2018   Procedure: CATARACT EXTRACTION PHACO AND INTRAOCULAR LENS PLACEMENT (IOC);  Surgeon: Baruch Goldmann, MD;  Location: [facility];  Service: Ophthalmology;  Laterality: Left;  CDE: 7.15  . CHOLECYSTECTOMY    . FEMORAL-POPLITEAL BYPASS GRAFT Right 09/15/2019   Procedure: Right BYPASS GRAFT FEMORAL to Above Knee POPLITEAL ARTERY;  Surgeon: Waynetta Sandy, MD;  Location: [facility];  Service: Vascular;  Laterality: Right;  . ORCHIECTOMY    . PERIPHERAL VASCULAR INTERVENTION Left 08/20/2019   Procedure: PERIPHERAL VASCULAR INTERVENTION;  Surgeon: Marty Heck, MD;  Location: [facility];  Service: Cardiovascular;  Laterality: Left;  common/external iliac  . VASECTOMY        Allergies Patient has no known allergies.  Family History  Problem Relation Age of Onset  . Hypertension Father   . Coronary artery disease Father   . Coronary artery disease Mother   . Cancer Mother        Breast    Social History Social History   Tobacco Use  . Smoking status: Current Every Day Smoker  Packs/day: 1.50    Types: Cigarettes  . Smokeless tobacco: Never Used  . Tobacco comment: stopped on admission to hospital on 08/19/19  Substance Use Topics  . Alcohol use: No  . Drug use: No    Review of Systems  Level V Caveat 2/2 altered mental status.  ____________________________________________   PHYSICAL EXAM:  VITAL SIGNS: ED Triage Vitals  Enc Vitals Group     BP 01/23/20 0106 (!) 149/115     Pulse  Rate 01/23/20 0106 (!) 112     Resp 01/23/20 0106 (!) 22     Temp 01/23/20 0100 (!) 102.6 F (39.2 C)     Temp Source 01/23/20 0100 Oral     SpO2 01/23/20 0106 (!) 79 %    Constitutional: Alert but agitated and not compliant with questioning to fully examine orientation. Well appearing and in no acute distress. Eyes: Conjunctivae are normal. PERRL. EOMI. Eyes are sunken. Head: Atraumatic. Sunken temples. Nose: No congestion/rhinnorhea. Mouth/Throat: Mucous membranes are dry and chapped lips.  Oropharynx non-erythematous. Neck: No stridor.  No meningeal signs.   Cardiovascular: tachycardic rate, irregular rhythm. Good peripheral circulation. Grossly normal heart sounds.  Intact right DP pulse with warm foot, normal cap refill. Left foot with dopplerable PT pulse, non dopplerable DP pulse, good cap refill, slightly cooler than right.  Respiratory: tachypneic and shallow breathing with prolonged expiratory phase.  No retractions. Lungs diminished, poor effort. Gastrointestinal: Soft and nontender. No distention.  Musculoskeletal: cachectic appearing. Only one toe left on right foot. Open wounds around it, hemostatic but bloody appearing. Neurologic:  Normal speech and language. No gross focal neurologic deficits are appreciated.  Skin: malodorous right foot, erythema to right proximal thigh, also edematous.   ____________________________________________   LABS (all labs ordered are listed, but only abnormal results are displayed)  Labs Reviewed  LACTIC ACID, PLASMA - Abnormal; Notable for the following components:      Result Value   Lactic Acid, Venous 4.1 (*)    All other components within normal limits  LACTIC ACID, PLASMA - Abnormal; Notable for the following components:   Lactic Acid, Venous 3.0 (*)    All other components within normal limits  COMPREHENSIVE METABOLIC PANEL - Abnormal; Notable for the following components:   Potassium 3.1 (*)    Chloride 95 (*)    Glucose, Bld  171 (*)    All other components within normal limits  CBC WITH DIFFERENTIAL/PLATELET - Abnormal; Notable for the following components:   WBC 12.2 (*)    Platelets 125 (*)    Neutro Abs 10.8 (*)    Lymphs Abs 0.6 (*)    All other components within normal limits  APTT - Abnormal; Notable for the following components:   aPTT 39 (*)    All other components within normal limits  PROTIME-INR - Abnormal; Notable for the following components:   Prothrombin Time 15.5 (*)    All other components within normal limits  URINALYSIS, ROUTINE W REFLEX MICROSCOPIC - Abnormal; Notable for the following components:   APPearance HAZY (*)    Hgb urine dipstick LARGE (*)    Protein, ur 100 (*)    RBC / HPF >50 (*)    All other components within normal limits  CK - Abnormal; Notable for the following components:   Total CK 502 (*)    All other components within normal limits  CULTURE, BLOOD (ROUTINE X 2)  CULTURE, BLOOD (ROUTINE X 2)  RESPIRATORY PANEL BY RT PCR (FLU A&B,  COVID)  URINE CULTURE  HIV ANTIBODY (ROUTINE TESTING W REFLEX)  CBC  COMPREHENSIVE METABOLIC PANEL   ____________________________________________  EKG   EKG Interpretation  Date/Time:  Saturday January 23 2020 00:59:57 EST Ventricular Rate:  118 PR Interval:    QRS Duration: 96 QT Interval:  333 QTC Calculation: 467 R Axis:   62 Text Interpretation: Atrial fibrillation vs sinus arrhythmia Probable left ventricular hypertrophy Baseline wander in lead(s) V1 faster rate than previous taller qrs/t waves than peviously no discernible p waves in all beats Confirmed by Merrily Pew [phone]) on 01/23/2020 1:34:58 AM       ____________________________________________  RADIOLOGY  DG Chest Port 1 View  Result Date: 01/23/2020 CLINICAL DATA:  Change in mental status EXAM: PORTABLE CHEST 1 VIEW COMPARISON:  August 19, 2019 FINDINGS: The heart size and mediastinal contours are within normal limits. Both lungs are clear. The  visualized skeletal structures are unremarkable. IMPRESSION: No active disease. Electronically Signed   By: Prudencio Pair M.D.   On: 01/23/2020 01:49    ____________________________________________   PROCEDURES  Procedure(s) performed:   .Critical Care Performed by: Merrily Pew, MD Authorized by: Merrily Pew, MD   Critical care provider statement:    Critical care time (minutes):  45   Critical care was necessary to treat or prevent imminent or life-threatening deterioration of the following conditions:  Sepsis, respiratory failure and dehydration   Critical care was time spent personally by me on the following activities:  Discussions with consultants, evaluation of patient's response to treatment, examination of patient, ordering and performing treatments and interventions, ordering and review of laboratory studies, ordering and review of radiographic studies, pulse oximetry, re-evaluation of patient's condition, obtaining history from patient or surrogate and review of old charts     ____________________________________________   INITIAL IMPRESSION / [place] / ED COURSE  Code sepsis called secondary to likely right leg cellulitis and possible right foot cellulitis as well.  He also is febrile, hypoxic, tachycardic and tachypneic.  All this could be from the fever however is not really participating too well and physical examination maneuvers so I cannot tell if he has a pneumonia as well.  Since his technically undetermined source I will go ahead and do broad-spectrum antibiotics this time.  We do not have an up-to-date weight but 2500 cc should cover and.  He is very cachectic appearing so obvious have some chronic comorbidities that make his prognosis guarded at best.  Pertinent labs & imaging results that were available during my care of the patient were reviewed by me and considered in my medical decision making (see chart for details).  A medical screening exam  was performed and I feel the patient has had an appropriate workup for their chief complaint at this time and likelihood of emergent condition existing is low. They have been counseled on decision, discharge, follow up and which symptoms necessitate immediate return to the emergency department. They or their family verbally stated understanding and agreement with plan and discharged in stable condition.   ____________________________________________  FINAL CLINICAL IMPRESSION(S) / ED DIAGNOSES  Final diagnoses:  Altered mental status, unspecified altered mental status type  Sepsis, due to unspecified organism, unspecified whether acute organ dysfunction present ([place])  Hypokalemia     MEDICATIONS GIVEN DURING THIS VISIT:  Medications  LORazepam (ATIVAN) injection 0.5 mg (0.5 mg Intravenous Given 01/23/20 0217)  potassium chloride 10 mEq in 100 mL IVPB (0 mEq Intravenous Stopped 01/23/20 0306)  ceFEPIme (MAXIPIME) 2 g  in sodium chloride 0.9 % 100 mL IVPB (has no administration in time range)  vancomycin (VANCOREADY) IVPB 750 mg/150 mL (has no administration in time range)  aspirin EC tablet 81 mg (has no administration in time range)  rosuvastatin (CRESTOR) tablet 20 mg (has no administration in time range)  clopidogrel (PLAVIX) tablet 75 mg (has no administration in time range)  sucralfate (CARAFATE) tablet 1 g (has no administration in time range)  lactated ringers infusion (has no administration in time range)  ondansetron (ZOFRAN) tablet 4 mg (has no administration in time range)    Or  ondansetron (ZOFRAN) injection 4 mg (has no administration in time range)  ceFEPIme (MAXIPIME) 2 g in sodium chloride 0.9 % 100 mL IVPB (0 g Intravenous Stopped 01/23/20 0240)  metroNIDAZOLE (FLAGYL) IVPB 500 mg (0 mg Intravenous Stopped 01/23/20 0301)  vancomycin (VANCOCIN) IVPB 1000 mg/200 mL premix (0 mg Intravenous Stopped 01/23/20 0342)  lactated ringers bolus 1,000 mL (0 mLs Intravenous Stopped 01/23/20  0338)  lactated ringers bolus 1,000 mL (0 mLs Intravenous Stopped 01/23/20 0258)  lactated ringers bolus 500 mL (0 mLs Intravenous Stopped 01/23/20 0419)  acetaminophen (TYLENOL) tablet 1,000 mg (1,000 mg Oral Given 01/23/20 0204)  potassium chloride 10 mEq in 100 mL IVPB (10 mEq Intravenous New Bag/Given 01/23/20 0509)     NEW OUTPATIENT MEDICATIONS STARTED DURING THIS VISIT:  Current Discharge Medication List      Note:  This note was prepared with assistance of Dragon voice recognition software. Occasional wrong-word or sound-a-like substitutions may have occurred due to the inherent limitations of voice recognition software.   Mckenize Mezera, Corene Cornea, MD 01/23/20 [phone]

## 2020-01-23 NOTE — ED Notes (Signed)
Date and time results received: 01/23/20 0151 (use smartphrase ".now" to insert current time)  Test: Lactic acid  Critical Value: 4.1  Name of Provider Notified: Dr. Dayna Barker  Orders Received? Or Actions Taken?: see chart

## 2020-01-23 NOTE — ED Notes (Signed)
Assisted tech to clean up patient on arrival. Bleeding noted to right foot. Attempted to assess. Removed sock and noticed foot placed in ziplock bag under the sock. Foot is extremely malodorus.

## 2020-01-23 NOTE — ED Notes (Signed)
Daughter(Rhonda) expressed concerns about pt ability to care for himself. She states that she has "been trying to get him some help but he is ornery and refuses to listen."  Daughter said she saw the patient on Monday and he was not in this condition. States "A neighbor checks on him and said he has not been himself for about 2 days. He has been seen lying on the couch with his pants around his ankles and urinating in the trash can."

## 2020-01-23 NOTE — ED Triage Notes (Signed)
Pt from home via EMS. Per ems, neighbor called out after checking on pt and finding him laying on the couch, urinated on himself and is confused. Pt appears agitated at being asked questions and is refusing to answer. Bleeding noted to right foot from surgery 2 months ago, per EMS.

## 2020-01-23 NOTE — Progress Notes (Signed)
[facility] CARE NOTE   01/23/2020 1:53 PM  Raymond Walker. was seen and examined.  The H&P by the admitting provider, orders, imaging was reviewed.  Please see new orders.  Ordered a CT as work-up for his mental status changes.  I also ordered additional lactic acid level to be sure that it continues to trend down.  I tried to call his daughter but after several attempts there was no answer.  We will try tomorrow.  Will continue to follow.   Vitals:   01/23/20 0659 01/23/20 0754  BP: (!) 107/58   Pulse: 64   Resp: 19   Temp: 97.7 F (36.5 C)   SpO2:  96%    Results for orders placed or performed during the hospital encounter of 01/23/20  Blood Culture (routine x 2)   Specimen: Right Antecubital; Blood  Result Value Ref Range   Specimen Description RIGHT ANTECUBITAL    Special Requests      BOTTLES DRAWN AEROBIC AND ANAEROBIC Blood Culture adequate volume   Culture      NO GROWTH < 12 HOURS Performed at [facility], [address], [place], [place] [address]    Report Status PENDING   Blood Culture (routine x 2)   Specimen: Left Antecubital; Blood  Result Value Ref Range   Specimen Description LEFT ANTECUBITAL    Special Requests      BOTTLES DRAWN AEROBIC AND ANAEROBIC Blood Culture adequate volume   Culture      NO GROWTH < 12 HOURS Performed at [facility], [address], [place], [place] [address]    Report Status PENDING   Respiratory Panel by RT PCR (Flu A&B, Covid) - Nasopharyngeal Swab   Specimen: Nasopharyngeal Swab  Result Value Ref Range   SARS Coronavirus 2 by RT PCR NEGATIVE NEGATIVE   Influenza A by PCR NEGATIVE NEGATIVE   Influenza B by PCR NEGATIVE NEGATIVE  Lactic acid, plasma  Result Value Ref Range   Lactic Acid, Venous 4.1 (HH) 0.5 - 1.9 mmol/L  Lactic acid, plasma  Result Value Ref Range   Lactic Acid, Venous 3.0 (HH) 0.5 - 1.9 mmol/L  Comprehensive metabolic panel  Result Value Ref Range   Sodium 135 135 - 145 mmol/L   Potassium  3.1 (L) 3.5 - 5.1 mmol/L   Chloride 95 (L) 98 - 111 mmol/L   CO2 26 22 - 32 mmol/L   Glucose, Bld 171 (H) 70 - 99 mg/dL   BUN 16 8 - 23 mg/dL   Creatinine, Ser 1.03 0.61 - 1.24 mg/dL   Calcium 9.0 8.9 - 10.3 mg/dL   Total Protein 8.0 6.5 - 8.1 g/dL   Albumin 3.6 3.5 - 5.0 g/dL   AST 24 15 - 41 U/L   ALT 11 0 - 44 U/L   Alkaline Phosphatase 78 38 - 126 U/L   Total Bilirubin 0.9 0.3 - 1.2 mg/dL   GFR calc non Af Amer >60 >60 mL/min   GFR calc Af Amer >60 >60 mL/min   Anion gap 14 5 - 15  CBC WITH DIFFERENTIAL  Result Value Ref Range   WBC 12.2 (H) 4.0 - 10.5 K/uL   RBC 4.71 4.22 - 5.81 MIL/uL   Hemoglobin 13.5 13.0 - 17.0 g/dL   HCT 41.5 39.0 - 52.0 %   MCV 88.1 80.0 - 100.0 fL   MCH 28.7 26.0 - 34.0 pg   MCHC 32.5 30.0 - 36.0 g/dL   RDW 14.8 11.5 - 15.5 %  Platelets 125 (L) 150 - 400 K/uL   nRBC 0.0 0.0 - 0.2 %   Neutrophils Relative % 88 %   Neutro Abs 10.8 (H) 1.7 - 7.7 K/uL   Lymphocytes Relative 5 %   Lymphs Abs 0.6 (L) 0.7 - 4.0 K/uL   Monocytes Relative 6 %   Monocytes Absolute 0.8 0.1 - 1.0 K/uL   Eosinophils Relative 0 %   Eosinophils Absolute 0.0 0.0 - 0.5 K/uL   Basophils Relative 0 %   Basophils Absolute 0.0 0.0 - 0.1 K/uL   Immature Granulocytes 1 %   Abs Immature Granulocytes 0.06 0.00 - 0.07 K/uL  APTT  Result Value Ref Range   aPTT 39 (H) 24 - 36 seconds  Protime-INR  Result Value Ref Range   Prothrombin Time 15.5 (H) 11.4 - 15.2 seconds   INR 1.2 0.8 - 1.2  Urinalysis, Routine w reflex microscopic  Result Value Ref Range   Color, Urine YELLOW YELLOW   APPearance HAZY (A) CLEAR   Specific Gravity, Urine 1.018 1.005 - 1.030   pH 6.0 5.0 - 8.0   Glucose, UA NEGATIVE NEGATIVE mg/dL   Hgb urine dipstick LARGE (A) NEGATIVE   Bilirubin Urine NEGATIVE NEGATIVE   Ketones, ur NEGATIVE NEGATIVE mg/dL   Protein, ur 100 (A) NEGATIVE mg/dL   Nitrite NEGATIVE NEGATIVE   Leukocytes,Ua NEGATIVE NEGATIVE   RBC / HPF >50 (H) 0 - 5 RBC/hpf   WBC, UA 21-50 0 -  5 WBC/hpf   Bacteria, UA NONE SEEN NONE SEEN   Squamous Epithelial / LPF 0-5 0 - 5   Mucus PRESENT    Budding Yeast PRESENT   CK  Result Value Ref Range   Total CK 502 (H) 49 - 397 U/L  HIV Antibody (routine testing w rflx)  Result Value Ref Range   HIV Screen 4th Generation wRfx NON REACTIVE NON REACTIVE  CBC  Result Value Ref Range   WBC 9.8 4.0 - 10.5 K/uL   RBC 3.87 (L) 4.22 - 5.81 MIL/uL   Hemoglobin 11.0 (L) 13.0 - 17.0 g/dL   HCT 34.4 (L) 39.0 - 52.0 %   MCV 88.9 80.0 - 100.0 fL   MCH 28.4 26.0 - 34.0 pg   MCHC 32.0 30.0 - 36.0 g/dL   RDW 14.9 11.5 - 15.5 %   Platelets 99 (L) 150 - 400 K/uL   nRBC 0.0 0.0 - 0.2 %  RPR  Result Value Ref Range   RPR Ser Ql NON REACTIVE NON REACTIVE  Vitamin B12  Result Value Ref Range   Vitamin B-12 166 (L) 180 - 914 pg/mL  Sedimentation rate  Result Value Ref Range   Sed Rate 15 0 - 16 mm/hr  TSH  Result Value Ref Range   TSH 1.558 0.350 - 4.500 uIU/mL  Folate  Result Value Ref Range   Folate 15.0 >5.9 ng/mL  Hemoglobin A1c  Result Value Ref Range   Hgb A1c MFr Bld 5.7 (H) 4.8 - 5.6 %   Mean Plasma Glucose 116.89 mg/dL  Lactic acid, plasma  Result Value Ref Range   Lactic Acid, Venous 1.6 0.5 - 1.9 mmol/L   Time spent: 34 minutes  Murvin Natal, MD [facility]   01/23/2020 12:43 AM How to contact the [facility] Attending or Consulting provider 7A - 7P or covering provider during after hours 7P -7A, for this patient?  1. Check the care team in [facility] and look for a) attending/consulting TRH provider listed and b) the [place]  team listed 2. Log into [email or web address] and use [place]'s universal password to access. If you do not have the password, please contact the hospital operator. 3. Locate the [facility] provider you are looking for under [facility] and page to a number that you can be directly reached. 4. If you still have difficulty reaching the provider, please page the [facility] (Director on Call) for the Hospitalists listed on  amion for assistance.

## 2020-01-23 NOTE — Progress Notes (Signed)
Pharmacy Antibiotic Note  Raymond Walker. is a 69 y.o. male admitted on 01/23/2020 with sepsis.  Pharmacy has been consulted for vancomycin and cefepime dosing. Cefepime 2gm and vancomycin 1gm ordered in the ED  Plan: Continue cefepime 2gm IV q8 hours Cont vancomycin 750 mg IV q12 hours F/u renal function, cultures and clinical course  Height: 5\' 10"  (177.8 cm) Weight: 145 lb 1 oz (65.8 kg) IBW/kg (Calculated) : 73  Temp (24hrs), Avg:102.6 F (39.2 C), Min:102.6 F (39.2 C), Max:102.6 F (39.2 C)  Recent Labs  Lab 01/23/20 0118  WBC 12.2*  CREATININE 1.03  LATICACIDVEN 4.1*    Estimated Creatinine Clearance: 63 mL/min (by C-G formula based on SCr of 1.03 mg/dL).    No Known Allergies  Thank you for allowing pharmacy to be a part of this patient's care.  Raymond Walker 01/23/2020 2:47 AM

## 2020-01-23 NOTE — ED Notes (Signed)
Pt clothing and medications sent home with daughter, Suanne Marker.

## 2020-01-23 NOTE — ED Notes (Signed)
Patient 02 sat was reading 79 percent room air, patient coughing.

## 2020-01-24 ENCOUNTER — Other Ambulatory Visit: Payer: Self-pay

## 2020-01-24 DIAGNOSIS — N320 Bladder-neck obstruction: Secondary | ICD-10-CM

## 2020-01-24 DIAGNOSIS — E559 Vitamin D deficiency, unspecified: Secondary | ICD-10-CM

## 2020-01-24 DIAGNOSIS — Z87442 Personal history of urinary calculi: Secondary | ICD-10-CM

## 2020-01-24 DIAGNOSIS — J449 Chronic obstructive pulmonary disease, unspecified: Secondary | ICD-10-CM

## 2020-01-24 DIAGNOSIS — L03115 Cellulitis of right lower limb: Secondary | ICD-10-CM

## 2020-01-24 LAB — CBC WITH DIFFERENTIAL/PLATELET
Abs Immature Granulocytes: 0.03 10*3/uL (ref 0.00–0.07)
Basophils Absolute: 0 10*3/uL (ref 0.0–0.1)
Basophils Relative: 1 %
Eosinophils Absolute: 0 10*3/uL (ref 0.0–0.5)
Eosinophils Relative: 1 %
HCT: 33.1 % — ABNORMAL LOW (ref 39.0–52.0)
Hemoglobin: 10.4 g/dL — ABNORMAL LOW (ref 13.0–17.0)
Immature Granulocytes: 1 %
Lymphocytes Relative: 18 %
Lymphs Abs: 1 10*3/uL (ref 0.7–4.0)
MCH: 28.3 pg (ref 26.0–34.0)
MCHC: 31.4 g/dL (ref 30.0–36.0)
MCV: 89.9 fL (ref 80.0–100.0)
Monocytes Absolute: 0.6 10*3/uL (ref 0.1–1.0)
Monocytes Relative: 11 %
Neutro Abs: 3.5 10*3/uL (ref 1.7–7.7)
Neutrophils Relative %: 68 %
Platelets: 93 10*3/uL — ABNORMAL LOW (ref 150–400)
RBC: 3.68 MIL/uL — ABNORMAL LOW (ref 4.22–5.81)
RDW: 14.9 % (ref 11.5–15.5)
WBC: 5.2 10*3/uL (ref 4.0–10.5)
nRBC: 0 % (ref 0.0–0.2)

## 2020-01-24 LAB — RAPID URINE DRUG SCREEN, HOSP PERFORMED
Amphetamines: NOT DETECTED
Barbiturates: NOT DETECTED
Benzodiazepines: NOT DETECTED
Cocaine: NOT DETECTED
Opiates: NOT DETECTED
Tetrahydrocannabinol: NOT DETECTED

## 2020-01-24 LAB — COMPREHENSIVE METABOLIC PANEL
ALT: 12 U/L (ref 0–44)
AST: 18 U/L (ref 15–41)
Albumin: 2.5 g/dL — ABNORMAL LOW (ref 3.5–5.0)
Alkaline Phosphatase: 50 U/L (ref 38–126)
Anion gap: 7 (ref 5–15)
BUN: 19 mg/dL (ref 8–23)
CO2: 24 mmol/L (ref 22–32)
Calcium: 8.1 mg/dL — ABNORMAL LOW (ref 8.9–10.3)
Chloride: 107 mmol/L (ref 98–111)
Creatinine, Ser: 0.78 mg/dL (ref 0.61–1.24)
GFR calc Af Amer: >60 mL/min (ref >60)
GFR calc non Af Amer: >60 mL/min (ref >60)
Glucose, Bld: 104 mg/dL — ABNORMAL HIGH (ref 70–99)
Potassium: 3.8 mmol/L (ref 3.5–5.1)
Sodium: 138 mmol/L (ref 135–145)
Total Bilirubin: 0.6 mg/dL (ref 0.3–1.2)
Total Protein: 5.7 g/dL — ABNORMAL LOW (ref 6.5–8.1)

## 2020-01-24 LAB — VITAMIN D 25 HYDROXY (VIT D DEFICIENCY, FRACTURES): Vit D, 25-Hydroxy: 17.06 ng/mL — ABNORMAL LOW (ref 30–100)

## 2020-01-24 LAB — URINE CULTURE: Culture: NO GROWTH

## 2020-01-24 LAB — MAGNESIUM: Magnesium: 1.4 mg/dL — ABNORMAL LOW (ref 1.7–2.4)

## 2020-01-24 MED ORDER — VITAMIN D (ERGOCALCIFEROL) 1.25 MG (50000 UNIT) PO CAPS
50000.0000 [IU] | ORAL_CAPSULE | ORAL | Status: AC
  Administered 2020-01-25 – 2020-01-28 (×2): 50000 [IU] via ORAL
  Filled 2020-01-24 (×2): qty 1

## 2020-01-24 MED ORDER — CYANOCOBALAMIN 1000 MCG/ML IJ SOLN
1000.0000 ug | Freq: Once | INTRAMUSCULAR | Status: AC
  Administered 2020-01-24: 10:00:00 1000 ug via INTRAMUSCULAR
  Filled 2020-01-24: qty 1

## 2020-01-24 MED ORDER — MAGNESIUM SULFATE 2 GM/50ML IV SOLN
2.0000 g | Freq: Once | INTRAVENOUS | Status: AC
  Administered 2020-01-24: 13:00:00 2 g via INTRAVENOUS
  Filled 2020-01-24: qty 50

## 2020-01-24 MED ORDER — VITAMIN B-12 1000 MCG PO TABS
1000.0000 ug | ORAL_TABLET | Freq: Every day | ORAL | Status: DC
  Administered 2020-01-25 – 2020-01-30 (×6): 1000 ug via ORAL
  Filled 2020-01-24 (×6): qty 1

## 2020-01-24 MED ORDER — VANCOMYCIN HCL 750 MG/150ML IV SOLN
750.0000 mg | Freq: Two times a day (BID) | INTRAVENOUS | Status: DC
  Administered 2020-01-25 – 2020-01-28 (×7): 750 mg via INTRAVENOUS
  Filled 2020-01-24 (×10): qty 150

## 2020-01-24 NOTE — Progress Notes (Signed)
Patient valuables (keys and wallet) placed in envelope and sent to security.

## 2020-01-24 NOTE — Progress Notes (Signed)
PROGRESS NOTE Raymond Norwood Yeh Jr.  EX:[id number]  DOB: 1951-12-10  DOA: 01/23/2020 PCP: [name], MD   Brief Admission Hx: 69 y.o. male, with history of COPD, hypertension, thrombocytopenia, s/p right femoral to above-knee pop bypass with toe amputation with Dr. Donzetta Matters late September 2020 was brought to hospital with altered mental status and severe sepsis.  Apparently neighbor found him on the couch, not moving, confused, incontinent of urine and feces.  Patient's daughter had not seen him in a week.  Patient was found to have bleeding from the right toe amputation site, lactic acid was 4.1.  Patient was started on vancomycin and cefepime empirically for sepsis.  MDM/Assessment & Plan:   1. Sepsis of unknown cause - He seems to have a mild cellulitis of RLE and possible UTI.  He has been empirically started on broad spectrum IV antibiotics and supportive care.  His lactate was initially markedly elevated at 4.1 now is trending down.  His sepsis physiology is resolving.  WBC trending down.  Blood cultures no growth to date.  Urine culture pending.  I conferenced with surgeon Dr. Arnoldo Morale and we examined the patient together and there was some concern about inflamed lymph node in right groin.  There is redness and erythema in the right leg.  Dr. Arnoldo Morale was concerned about possible graft infection given recent femoropopliteal bypass.  He recommended patient be seen by the vascular surgery team.  I called and made arrangements for patient to transfer to [facility].  Dr. Oneida Alar will see him in consultation. 2. Acute metabolic encephalopathy-his mentation is improving with treatment.  Continue to follow closely. 3. That is post right femoropopliteal bypass with graft-Dr. Arnoldo Morale was concerned that patient may have a graft infection given the erythema and redness and swollen lymph node present.  The patient is being transferred to [facility] for vascular consult.  Spoke with Dr.  Oneida Alar who will see the patient in consultation. 4. Question of atrial fibrillation-not convinced that this was true atrial fibrillation suspect this was a sinus arrhythmia.  He has had no recurrence of this.  He is being monitored on telemetry.  Continue to follow. 5. Thrombocytopenia-suspect secondary to recent sepsis.  Following.  Holding all heparin products.  Recheck in a.m. 6. Severe vitamin D deficiency-supplement ordered. 7. Hematuria - this is chronic. CT shows that he has renal stones. He was supposed to have followed up with urology but has not yet.  8. Chronic bladder outlet obstruction from enlarged prostate - I/O cath as needed if unable to void.  9. Hypomagnesemia-IV replacement ordered.  Recheck in a.m. 10. Lactic acidosis-resolved 11. Anemia of chronic disease-small drop in hemoglobin secondary to hemodilution.  Following.  DVT prophylaxis: SCDs Code Status: Full Family Communication: tried to contact daughter by phone, but not picking up, also tried on 2/6 Disposition Plan: [facility] to [facility] for subspecialty consultation  Consultants:  Surgery   Vascular surgery Oneida Alar)  Procedures:    Antimicrobials:  Cefepime  vancomycin   Subjective: Patient is alert and oriented and able to speak which is much improved from yesterday.  He has no specific complaints.  He does want to try ambulating today.  Objective: Vitals:   01/23/20 0754 01/23/20 1300 01/23/20 2105 01/24/20 0500  BP:  121/62 (!) 104/58 135/61  Pulse:  68 68 76  Resp:  18 16 17   Temp:  97.7 F (36.5 C) 98.2 F (36.8 C) 99.1 F (37.3 C)  TempSrc:  Oral  SpO2: 96% 96% 93% 97%  Weight:      Height:        Intake/Output Summary (Last 24 hours) at 01/24/2020 1214 Last data filed at 01/24/2020 1002 Gross per 24 hour  Intake 3584.78 ml  Output 1600 ml  Net 1984.78 ml   Filed Weights   01/23/20 0106 01/23/20 0600  Weight: 65.8 kg 65.8 kg   REVIEW OF SYSTEMS  As per history otherwise  all reviewed and reported negative  Exam:  General exam: Chronically ill-appearing male awake and alert no apparent distress, cooperative. Respiratory system: Clear. No increased work of breathing. Cardiovascular system: S1 & S2 heard. No JVD, murmurs, gallops, clicks or pedal edema. Gastrointestinal system: Abdomen is nondistended, soft and nontender. Normal bowel sounds heard. Central nervous system: Alert and oriented. No focal neurological deficits. Extremities: large black eschar right foot wound, no purulence or drainage seen, palpable right pedal pulse, erythema along right thigh and lower leg, incision sites healed and intact, swollen nontender lymph node right groin.  Data Reviewed: Basic Metabolic Panel: Recent Labs  Lab 01/23/20 0118 01/24/20 0706  NA 135 138  K 3.1* 3.8  CL 95* 107  CO2 26 24  GLUCOSE 171* 104*  BUN 16 19  CREATININE 1.03 0.78  CALCIUM 9.0 8.1*  MG  --  1.4*   Liver Function Tests: Recent Labs  Lab 01/23/20 0118 01/24/20 0706  AST 24 18  ALT 11 12  ALKPHOS 78 50  BILITOT 0.9 0.6  PROT 8.0 5.7*  ALBUMIN 3.6 2.5*   No results for input(s): LIPASE, AMYLASE in the last 168 hours. No results for input(s): AMMONIA in the last 168 hours. CBC: Recent Labs  Lab 01/23/20 0118 01/23/20 0710 01/24/20 0706  WBC 12.2* 9.8 5.2  NEUTROABS 10.8*  --  3.5  HGB 13.5 11.0* 10.4*  HCT 41.5 34.4* 33.1*  MCV 88.1 88.9 89.9  PLT 125* 99* 93*   Cardiac Enzymes: Recent Labs  Lab 01/23/20 0118  CKTOTAL 502*   CBG (last 3)  No results for input(s): GLUCAP in the last 72 hours. Recent Results (from the past 240 hour(s))  Blood Culture (routine x 2)     Status: None (Preliminary result)   Collection Time: 01/23/20  1:19 AM   Specimen: Right Antecubital; Blood  Result Value Ref Range Status   Specimen Description RIGHT ANTECUBITAL  Final   Special Requests   Final    BOTTLES DRAWN AEROBIC AND ANAEROBIC Blood Culture adequate volume   Culture   Final     NO GROWTH 1 DAY Performed at [facility], [address] [place], [place] [address]    Report Status PENDING  Incomplete  Blood Culture (routine x 2)     Status: None (Preliminary result)   Collection Time: 01/23/20  1:23 AM   Specimen: Left Antecubital; Blood  Result Value Ref Range Status   Specimen Description LEFT ANTECUBITAL  Final   Special Requests   Final    BOTTLES DRAWN AEROBIC AND ANAEROBIC Blood Culture adequate volume   Culture   Final    NO GROWTH 1 DAY Performed at [facility], [address], [place], [place] [address]    Report Status PENDING  Incomplete  Respiratory Panel by RT PCR (Flu A&B, Covid) - Nasopharyngeal Swab     Status: None   Collection Time: 01/23/20  2:00 AM   Specimen: Nasopharyngeal Swab  Result Value Ref Range Status   SARS Coronavirus 2 by RT PCR  NEGATIVE NEGATIVE Final    Comment: (NOTE) SARS-CoV-2 target nucleic acids are NOT DETECTED. The SARS-CoV-2 RNA is generally detectable in upper respiratoy specimens during the acute phase of infection. The lowest concentration of SARS-CoV-2 viral copies this assay can detect is 131 copies/mL. A negative result does not preclude SARS-Cov-2 infection and should not be used as the sole basis for treatment or other patient management decisions. A negative result may occur with  improper specimen collection/handling, submission of specimen other than nasopharyngeal swab, presence of viral mutation(s) within the areas targeted by this assay, and inadequate number of viral copies (<131 copies/mL). A negative result must be combined with clinical observations, patient history, and epidemiological information. The expected result is Negative. Fact Sheet for Patients:  PinkCheek.be Fact Sheet for Healthcare Providers:  GravelBags.it This test is not yet ap proved or cleared by the [place] FDA and  has been authorized for detection and/or  diagnosis of SARS-CoV-2 by FDA under an Emergency Use Authorization (EUA). This EUA will remain  in effect (meaning this test can be used) for the duration of the COVID-19 declaration under Section 564(b)(1) of the Act, 21 U.S.C. section 360bbb-3(b)(1), unless the authorization is terminated or revoked sooner.    Influenza A by PCR NEGATIVE NEGATIVE Final   Influenza B by PCR NEGATIVE NEGATIVE Final    Comment: (NOTE) The Xpert Xpress SARS-CoV-2/FLU/RSV assay is intended as an aid in  the diagnosis of influenza from Nasopharyngeal swab specimens and  should not be used as a sole basis for treatment. Nasal washings and  aspirates are unacceptable for Xpert Xpress SARS-CoV-2/FLU/RSV  testing. Fact Sheet for Patients: PinkCheek.be Fact Sheet for Healthcare Providers: GravelBags.it This test is not yet approved or cleared by the [place] FDA and  has been authorized for detection and/or diagnosis of SARS-CoV-2 by  FDA under an Emergency Use Authorization (EUA). This EUA will remain  in effect (meaning this test can be used) for the duration of the  Covid-19 declaration under Section 564(b)(1) of the Act, 21  U.S.C. section 360bbb-3(b)(1), unless the authorization is  terminated or revoked. Performed at [facility], [address], [place], [place] [address]   Urine culture     Status: None   Collection Time: 01/23/20  2:30 AM   Specimen: In/Out Cath Urine  Result Value Ref Range Status   Specimen Description   Final    IN/OUT CATH URINE Performed at [facility], [address] [place], [place] [address]    Special Requests   Final    NONE Performed at [facility], [address] [place], [place] [address]    Culture   Final    NO GROWTH Performed at [facility], [place] [address] [place], [place] [address]    Report Status 01/24/2020 FINAL  Final  MRSA PCR Screening     Status: None   Collection Time:  01/23/20  7:44 AM   Specimen: Nasal Mucosa; Nasopharyngeal  Result Value Ref Range Status   MRSA by PCR NEGATIVE NEGATIVE Final    Comment:        The GeneXpert MRSA Assay (FDA approved for NASAL specimens only), is one component of a comprehensive MRSA colonization surveillance program. It is not intended to diagnose MRSA infection nor to guide or monitor treatment for MRSA infections. Performed at [facility], [address], [place], [place] [address]      Studies: CT HEAD WO CONTRAST  Result Date: 01/23/2020 CLINICAL DATA:  Acute  neuro deficit, stroke suspected EXAM: CT HEAD WITHOUT CONTRAST TECHNIQUE: Contiguous axial images were obtained from the base of the skull through the vertex without intravenous contrast. COMPARISON:  MR brain, 11/06/2019 FINDINGS: Brain: No evidence of acute infarction, hemorrhage, hydrocephalus, extra-axial collection or mass lesion/mass effect.Periventricularand deep white matter hypodensity with focal hypodensity of the left caudate. Vascular: No hyperdense vessel or unexpected calcification. Skull: Normal. Negative for fracture or focal lesion. Sinuses/Orbits: No acute finding. Other: None. IMPRESSION: 1. No acute intracranial pathology. 2. 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 Electronically Signed   By: Eddie Candle M.D.   On: 01/23/2020 11:37   DG Chest Port 1 View  Result Date: 01/23/2020 CLINICAL DATA:  Change in mental status EXAM: PORTABLE CHEST 1 VIEW COMPARISON:  August 19, 2019 FINDINGS: The heart size and mediastinal contours are within normal limits. Both lungs are clear. The visualized skeletal structures are unremarkable. IMPRESSION: No active disease. Electronically Signed   By: Prudencio Pair M.D.   On: 01/23/2020 01:49   Scheduled Meds: . amLODipine  2.5 mg Oral Daily  . aspirin EC  81 mg Oral Daily  . clopidogrel  75 mg Oral Q breakfast  . rosuvastatin  20 mg Oral Daily  . sucralfate  1  g Oral TID WC & HS  . [START ON 01/25/2020] vitamin B-12  1,000 mcg Oral Daily  . [START ON 01/25/2020] Vitamin D (Ergocalciferol)  50,000 Units Oral Once per day on Mon Thu   Continuous Infusions: . 0.9 % NaCl with KCl 20 mEq / L 75 mL/hr at 01/24/20 0931  . ceFEPime (MAXIPIME) IV 2 g (01/24/20 0941)  . magnesium sulfate bolus IVPB    . vancomycin      Active Problems:   COPD (chronic obstructive pulmonary disease) (HCC)   Protein-calorie malnutrition, severe   Cellulitis of right lower extremity   PAD (peripheral artery disease) (HCC)   Sepsis (HCC)   Altered mental status   Hypokalemia   History of kidney stones   Hematuria   UTI (urinary tract infection)   CHRONIC Bladder outlet obstruction   Vitamin D deficiency  Time spent:   Irwin Brakeman, MD [facility] 01/24/2020, 12:14 PM    LOS: 1 day  How to contact the [facility] Attending or Consulting provider [place] or covering provider during after hours [place], for this patient?  1. Check the care team in [facility] and look for a) attending/consulting [facility] provider listed and b) the [facility] team listed 2. Log into [email or web address] and use [place]'s universal password to access. If you do not have the password, please contact the hospital operator. 3. Locate the [facility] provider you are looking for under [facility] and page to a number that you can be directly reached. 4. If you still have difficulty reaching the provider, please page the [facility] (Director on Call) for the Hospitalists listed on amion for assistance.

## 2020-01-24 NOTE — Progress Notes (Signed)
Pharmacy Antibiotic Note  Raymond Walker. is a 69 y.o. male admitted on 01/23/2020 with wound infection.  Pharmacy has been consulted for vancomycin and cefepime dosing. Cefepime 2gm and vancomycin 1gm ordered in the ED  Plan: Continue cefepime 2gm IV q8 hours Cont vancomycin 750 mg IV q12 hours F/u renal function, cultures and clinical course  Height: 5\' 10"  (177.8 cm) Weight: 145 lb 1 oz (65.8 kg) IBW/kg (Calculated) : 73  Temp (24hrs), Avg:98.3 F (36.8 C), Min:97.7 F (36.5 C), Max:99.1 F (37.3 C)  Recent Labs  Lab 01/23/20 0118 01/23/20 0314 01/23/20 0710 01/23/20 0916 01/24/20 0706  WBC 12.2*  --  9.8  --  5.2  CREATININE 1.03  --   --   --  0.78  LATICACIDVEN 4.1* 3.0*  --  1.6  --     Estimated Creatinine Clearance: 81.1 mL/min (by C-G formula based on SCr of 0.78 mg/dL).    No Known Allergies  Thank you for allowing pharmacy to be a part of this patient's care.  Cristy Friedlander 01/24/2020 10:57 AM

## 2020-01-24 NOTE — Progress Notes (Signed)
Received Raymond. Raymond Walker from [facility] via [facility].  Patient is awake, alert and oriented x 4.  Denies pain or discomfort at this time.  Oriented to room, placed on cardiac monitor and contacted [place] with POA, HCPOA and Legal Guardian, [employer], and updated on patient's recent history and location.  [name] requests that the staff be aware that Raymond Walker has a 3rd grade education and disposition of a young child or young teenager when talking to him.  [name] will be his contact for all health care decisions.  Mrs [name] will provide all documents on Monday 01/25/20.  [name] also states the last time she saw Raymond Walker was Tues 01/18/20 and he was not feeling sick.  [name] also would like to use [facility] on [address], [place], [place] for any rehab and discharge planning needs.

## 2020-01-25 ENCOUNTER — Inpatient Hospital Stay (HOSPITAL_COMMUNITY): Payer: Medicare Other

## 2020-01-25 ENCOUNTER — Encounter (HOSPITAL_COMMUNITY): Payer: Self-pay | Admitting: Family Medicine

## 2020-01-25 DIAGNOSIS — Z01810 Encounter for preprocedural cardiovascular examination: Secondary | ICD-10-CM

## 2020-01-25 DIAGNOSIS — R404 Transient alteration of awareness: Secondary | ICD-10-CM

## 2020-01-25 LAB — COMPREHENSIVE METABOLIC PANEL
ALT: 13 U/L (ref 0–44)
AST: 18 U/L (ref 15–41)
Albumin: 2.3 g/dL — ABNORMAL LOW (ref 3.5–5.0)
Alkaline Phosphatase: 50 U/L (ref 38–126)
Anion gap: 12 (ref 5–15)
BUN: 12 mg/dL (ref 8–23)
CO2: 25 mmol/L (ref 22–32)
Calcium: 8.3 mg/dL — ABNORMAL LOW (ref 8.9–10.3)
Chloride: 106 mmol/L (ref 98–111)
Creatinine, Ser: 0.72 mg/dL (ref 0.61–1.24)
GFR calc Af Amer: >60 mL/min (ref >60)
GFR calc non Af Amer: >60 mL/min (ref >60)
Glucose, Bld: 100 mg/dL — ABNORMAL HIGH (ref 70–99)
Potassium: 3.5 mmol/L (ref 3.5–5.1)
Sodium: 143 mmol/L (ref 135–145)
Total Bilirubin: 0.6 mg/dL (ref 0.3–1.2)
Total Protein: 5.5 g/dL — ABNORMAL LOW (ref 6.5–8.1)

## 2020-01-25 LAB — SURGICAL PCR SCREEN
MRSA, PCR: NEGATIVE
Staphylococcus aureus: NEGATIVE

## 2020-01-25 MED ORDER — IOHEXOL 350 MG/ML SOLN
100.0000 mL | Freq: Once | INTRAVENOUS | Status: AC | PRN
  Administered 2020-01-25: 09:00:00 100 mL via INTRAVENOUS

## 2020-01-25 MED ORDER — GUAIFENESIN 100 MG/5ML PO SOLN
5.0000 mL | ORAL | Status: DC | PRN
  Administered 2020-01-25: 21:00:00 100 mg via ORAL
  Filled 2020-01-25: qty 5

## 2020-01-25 NOTE — Progress Notes (Addendum)
Met in length with the pt's niece - Civil engineer, contracting - she provided documents to reflect she is the pt's [place]; discussed recent phone calls from a person named Cornelious Bryant.  The niece states that she wants to continue the patient confidentiality that is currently in place. --JM Met w/ pt. He request to remain on 4E, remain in a XXX capacity.  Instructed pt not to give information out to others. He agreed.  HCPOA remains at bedside.  She will be reaching out to "Suanne Marker" to update her as needed.---Velora Mediate

## 2020-01-25 NOTE — Progress Notes (Signed)
Pt belongings retrieved from security and given to [place], [place] to take home.  Amanda Cockayne, RN

## 2020-01-25 NOTE — Progress Notes (Signed)
PROGRESS NOTE    Raymond Walker.  EQA:[id number] DOB: 1951-10-01 DOA: 01/23/2020 PCP: [name], MD  Brief Narrative:  69 y.o.male,with history of COPD, hypertension, thrombocytopenia,s/p right femoral to above-knee pop bypass with toe amputation with Dr. Donzetta Matters late September 2020was brought to hospital with altered mental status and severe sepsis. Apparently neighbor found him on the couch, not moving, confused, incontinent of urine and feces. Patient's daughter had not seen him in a week.  Patient was found to have bleeding from the right toe amputation site, lactic acid was 4.1.Patient was started on vancomycin and cefepime empirically for sepsis.  Assessment & Plan:   Active Problems:   COPD (chronic obstructive pulmonary disease) (HCC)   Protein-calorie malnutrition, severe   Cellulitis of right lower extremity   PAD (peripheral artery disease) (HCC)   Sepsis (HCC)   Altered mental status   Hypokalemia   History of kidney stones   Hematuria   UTI (urinary tract infection)   CHRONIC Bladder outlet obstruction   Vitamin D deficiency  Sepsis of unknown cause: -Vascular surgery seen today and ordered imaging which confirms likely infected fem/pop bypass right leg. Awaiting further input for treatment moving forward.  -Noted possible pneumonia on CT as well which is covered by vancomycin and cefepime.   -Blood cultures no growth to date.  Urine culture pending.   Acute metabolic encephalopathy -resolved, monitor closely  Question of atrial fibrillation: -not convinced that this was true atrial fibrillation suspect this was a sinus arrhythmia. -He has had no recurrence of this -stop telemetry as this is limiting sleep and mental status  Thrombocytopenia -suspect secondary to recent sepsis -Following   -Holding all heparin products  Severe vitamin D deficiency -supplement ordered  Hematuria - this is chronic, CT shows that he has renal stones -f/u urology as  outpatient  Chronic bladder outlet obstruction from enlarged prostate  - I/O cath as needed if unable to void.   Hypomagnesemia-IV replacement ordered.  Recheck in a.m.  Lactic acidosis -resolved  Anemia of chronic disease-small drop in hemoglobin secondary to hemodilution.  Following.  DVT prophylaxis: SCDs, aspirin and plavix Code Status: full Family Communication: tried to call niece (HCPOA) and updated on situation Disposition Plan:  . Patient came from: home            . Anticipated d/c place: SNF likely . Barriers to d/c OR conditions which need to be met to effect a safe d/c: currently needs more investigation per vascular surgery for infection fem/pop graft, needs continued IV antibiotics   Consultants:   Vascular surgery  Surgery  Procedures:  none  Antimicrobials:   Cefepime  Vancomycin    Subjective: Feeling well overall. Right leg is hurting. Denies chest pains. Denies SOB. Denies significant cough. Denies abdominal pain, diarrhea, constipation.   Objective: Vitals:   01/24/20 1642 01/24/20 2039 01/25/20 0639 01/25/20 0912  BP: 105/88 (!) 148/66 (!) 191/175 (!) 158/80  Pulse: 75 (!) 59 60   Resp: (!) 26   16  Temp: 98 F (36.7 C) 98.3 F (36.8 C) 98.1 F (36.7 C)   TempSrc: Oral Oral Oral   SpO2: 94% 100% 100%   Weight:      Height:        Intake/Output Summary (Last 24 hours) at 01/25/2020 0959 Last data filed at 01/25/2020 0600 Gross per 24 hour  Intake 1462.78 ml  Output 1250 ml  Net 212.78 ml   Filed Weights   01/23/20 0106 01/23/20 0600  Weight: 65.8 kg 65.8 kg   Examination: General exam: Appears calm and comfortable  Respiratory system: Clear to auscultation. Respiratory effort normal. Cardiovascular system: S1 & S2 heard, RRR. No JVD, murmurs, rubs, gallops or clicks. No pedal edema. Gastrointestinal system: Abdomen is nondistended, soft and nontender. No organomegaly or masses felt. Normal bowel sounds heard. Central nervous  system: Alert and oriented. No focal neurological deficits. Extremities:right foot with eschar over prior amputation site, pain right leg, swelling right femoral region Skin: No rashes, lesions or ulcers Psychiatry: Judgement and insight appear normal. Mood & affect appropriate.   Data Reviewed: I have personally reviewed following labs and imaging studies  CBC: Recent Labs  Lab 01/23/20 0118 01/23/20 0710 01/24/20 0706  WBC 12.2* 9.8 5.2  NEUTROABS 10.8*  --  3.5  HGB 13.5 11.0* 10.4*  HCT 41.5 34.4* 33.1*  MCV 88.1 88.9 89.9  PLT 125* 99* 93*   Basic Metabolic Panel: Recent Labs  Lab 01/23/20 0118 01/24/20 0706 01/25/20 0320  NA 135 138 143  K 3.1* 3.8 3.5  CL 95* 107 106  CO2 26 24 25  GLUCOSE 171* 104* 100*  BUN 16 19 12  CREATININE 1.03 0.78 0.72  CALCIUM 9.0 8.1* 8.3*  MG  --  1.4*  --    GFR: Estimated Creatinine Clearance: 81.1 mL/min (by C-G formula based on SCr of 0.72 mg/dL). Liver Function Tests: Recent Labs  Lab 01/23/20 0118 01/24/20 0706 01/25/20 0320  AST 24 18 18  ALT 11 12 13  ALKPHOS 78 50 50  BILITOT 0.9 0.6 0.6  PROT 8.0 5.7* 5.5*  ALBUMIN 3.6 2.5* 2.3*   No results for input(s): LIPASE, AMYLASE in the last 168 hours. No results for input(s): AMMONIA in the last 168 hours. Coagulation Profile: Recent Labs  Lab 01/23/20 0118  INR 1.2   Cardiac Enzymes: Recent Labs  Lab 01/23/20 0118  CKTOTAL 502*   BNP (last 3 results) No results for input(s): PROBNP in the last 8760 hours. HbA1C: Recent Labs    01/23/20 0730  HGBA1C 5.7*   CBG: No results for input(s): GLUCAP in the last 168 hours. Lipid Profile: No results for input(s): CHOL, HDL, LDLCALC, TRIG, CHOLHDL, LDLDIRECT in the last 72 hours. Thyroid Function Tests: Recent Labs    01/23/20 0914  TSH 1.558   Anemia Panel: Recent Labs    01/23/20 0914  VITAMINB12 166*  FOLATE 15.0   Sepsis Labs: Recent Labs  Lab 01/23/20 0118 01/23/20 0314 01/23/20 0916    LATICACIDVEN 4.1* 3.0* 1.6    Recent Results (from the past 240 hour(s))  Blood Culture (routine x 2)     Status: None (Preliminary result)   Collection Time: 01/23/20  1:19 AM   Specimen: Right Antecubital; Blood  Result Value Ref Range Status   Specimen Description RIGHT ANTECUBITAL  Final   Special Requests   Final    BOTTLES DRAWN AEROBIC AND ANAEROBIC Blood Culture adequate volume   Culture   Final    NO GROWTH 2 DAYS Performed at [facility], [address] [place], [place] [address]    Report Status PENDING  Incomplete  Blood Culture (routine x 2)     Status: None (Preliminary result)   Collection Time: 01/23/20  1:23 AM   Specimen: Left Antecubital; Blood  Result Value Ref Range Status   Specimen Description LEFT ANTECUBITAL  Final   Special Requests   Final    BOTTLES DRAWN AEROBIC AND ANAEROBIC Blood Culture adequate volume     Culture   Final    NO GROWTH 2 DAYS Performed at [facility], [address] [place], [place] [address]    Report Status PENDING  Incomplete  Respiratory Panel by RT PCR (Flu A&B, Covid) - Nasopharyngeal Swab     Status: None   Collection Time: 01/23/20  2:00 AM   Specimen: Nasopharyngeal Swab  Result Value Ref Range Status   SARS Coronavirus 2 by RT PCR NEGATIVE NEGATIVE Final    Comment: (NOTE) SARS-CoV-2 target nucleic acids are NOT DETECTED. The SARS-CoV-2 RNA is generally detectable in upper respiratoy specimens during the acute phase of infection. The lowest concentration of SARS-CoV-2 viral copies this assay can detect is 131 copies/mL. A negative result does not preclude SARS-Cov-2 infection and should not be used as the sole basis for treatment or other patient management decisions. A negative result may occur with  improper specimen collection/handling, submission of specimen other than nasopharyngeal swab, presence of viral mutation(s) within the areas targeted by this assay, and inadequate number of viral copies (<131  copies/mL). A negative result must be combined with clinical observations, patient history, and epidemiological information. The expected result is Negative. Fact Sheet for Patients:  https://www.fda.gov/media/142436/download Fact Sheet for Healthcare Providers:  https://www.fda.gov/media/142435/download This test is not yet ap proved or cleared by the [place] FDA and  has been authorized for detection and/or diagnosis of SARS-CoV-2 by FDA under an Emergency Use Authorization (EUA). This EUA will remain  in effect (meaning this test can be used) for the duration of the COVID-19 declaration under Section 564(b)(1) of the Act, 21 U.S.C. section 360bbb-3(b)(1), unless the authorization is terminated or revoked sooner.    Influenza A by PCR NEGATIVE NEGATIVE Final   Influenza B by PCR NEGATIVE NEGATIVE Final    Comment: (NOTE) The Xpert Xpress SARS-CoV-2/FLU/RSV assay is intended as an aid in  the diagnosis of influenza from Nasopharyngeal swab specimens and  should not be used as a sole basis for treatment. Nasal washings and  aspirates are unacceptable for Xpert Xpress SARS-CoV-2/FLU/RSV  testing. Fact Sheet for Patients: https://www.fda.gov/media/142436/download Fact Sheet for Healthcare Providers: https://www.fda.gov/media/142435/download This test is not yet approved or cleared by the [place] FDA and  has been authorized for detection and/or diagnosis of SARS-CoV-2 by  FDA under an Emergency Use Authorization (EUA). This EUA will remain  in effect (meaning this test can be used) for the duration of the  Covid-19 declaration under Section 564(b)(1) of the Act, 21  U.S.C. section 360bbb-3(b)(1), unless the authorization is  terminated or revoked. Performed at [facility], [address] [place], [place] [address]   Urine culture     Status: None   Collection Time: 01/23/20  2:30 AM   Specimen: In/Out Cath Urine  Result Value Ref Range Status   Specimen Description    Final    IN/OUT CATH URINE Performed at [facility], [address] [place], [place] [address]    Special Requests   Final    NONE Performed at [facility], [address] [place], [place] [address]    Culture   Final    NO GROWTH Performed at [facility], [address] [place], [place] [address]    Report Status 01/24/2020 FINAL  Final  MRSA PCR Screening     Status: None   Collection Time: 01/23/20  7:44 AM   Specimen: Nasal Mucosa; Nasopharyngeal  Result Value Ref Range Status   MRSA by PCR NEGATIVE NEGATIVE Final      Comment:        The GeneXpert MRSA Assay (FDA approved for NASAL specimens only), is one component of a comprehensive MRSA colonization surveillance program. It is not intended to diagnose MRSA infection nor to guide or monitor treatment for MRSA infections. Performed at [facility], [address], [place], [place] [address]      Radiology Studies: CT HEAD WO CONTRAST  Result Date: 01/23/2020 CLINICAL DATA:  Acute neuro deficit, stroke suspected EXAM: CT HEAD WITHOUT CONTRAST TECHNIQUE: Contiguous axial images were obtained from the base of the skull through the vertex without intravenous contrast. COMPARISON:  MR brain, 11/06/2019 FINDINGS: Brain: No evidence of acute infarction, hemorrhage, hydrocephalus, extra-axial collection or mass lesion/mass effect.Periventricularand deep white matter hypodensity with focal hypodensity of the left caudate. Vascular: No hyperdense vessel or unexpected calcification. Skull: Normal. Negative for fracture or focal lesion. Sinuses/Orbits: No acute finding. Other: None. IMPRESSION: 1. No acute intracranial pathology. 2. 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 Electronically Signed   By: Eddie Candle M.D.   On: 01/23/2020 11:37   CT ANGIO AO+BIFEM W & OR WO CONTRAST  Result Date: 01/25/2020 CLINICAL DATA:  69 year old male with acute right lower extremity pain. Suspect  infection. History of prior revascularization. EXAM: CT ANGIOGRAPHY OF ABDOMINAL AORTA WITH ILIOFEMORAL RUNOFF TECHNIQUE: Multidetector CT imaging of the abdomen, pelvis and lower extremities was performed using the standard protocol during bolus administration of intravenous contrast. Multiplanar CT image reconstructions and MIPs were obtained to evaluate the vascular anatomy. CONTRAST:  170m OMNIPAQUE IOHEXOL 350 MG/ML SOLN COMPARISON:  CT scan of the abdomen and pelvis 11/30/2019; CT scan of the chest 10/12/2016. FINDINGS: VASCULAR Aorta: Extensive heterogeneous calcified and noncalcified atherosclerotic plaque throughout the abdominal aorta. There is a small penetrating atherosclerotic ulcer arising from the posterior aspect of the visceral aorta just after the origin the celiac artery. The aorta measures a maximum of 3.1 cm at this location. Mild aneurysmal dilation of the infrarenal segment with a maximal diameter of 3.1 cm. Celiac: Patent without evidence of aneurysm, dissection, vasculitis or significant stenosis. SMA: Fibrofatty atherosclerotic plaque in the proximal SMA results in mild stenosis. No evidence of dissection or aneurysm. Renals: Solitary renal arteries bilaterally. On the right, focal fibrofatty atherosclerotic plaque results in an approximately 50% stenosis of the proximal renal artery. The remainder of the artery is patent. No dissection. On the left, more extensive fibrofatty atherosclerotic plaque results in approximately 60% stenosis of the proximal renal artery. No evidence of dissection or aneurysm. IMA: Patent without evidence of aneurysm, dissection, vasculitis or significant stenosis. RIGHT Lower Extremity Inflow: Patent stent beginning at the aortic bifurcation extending throughout the mildly aneurysmal right common iliac artery. The stent crosses the origin of the internal iliac artery and terminates in the distal external iliac artery. The stent is widely patent without evidence  of in stent stenosis. The mildly aneurysmal common iliac artery measures up to 2.2 cm in diameter. The external iliac artery remains widely patent. Outflow: The common femoral artery is widely patent. The profunda femoral artery is widely patent. The native superficial femoral artery is chronically occluded. A femoral to above the knee bypass graft is present. The graft is widely patent without evidence of thrombus. There is some inflammatory stranding surrounding the proximal aspect of the graph. As the graft dives into the abductor musculature, it appears normal. No significant edematous changes within the muscular compartment. However, as the graft exits the muscular compartment, inflammatory changes  are again noted surrounding the graft. The inflammatory changes are most significant in the most distal aspect of the graft just proximal to the popliteal anastomosis. A region of probable phlegmon measures up to 2.4 x 1.3 cm and extends medially toward the superficial subcutaneous fat. Runoff: Three-vessel runoff to the ankle. LEFT Lower Extremity Inflow: Mildly aneurysmal common iliac artery measuring up to 1.8 cm. High-grade stenosis at the origin of the internal iliac artery. The rest of the artery remains patent. Fibrofatty atherosclerotic plaque results in a moderate focal stenosis of the proximal external iliac artery. The remainder the external iliac artery is patent. 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 No significant stenosis. The profunda femoral branches are widely patent. The native superficial femoral artery occludes just beyond the origin and remains occluded throughout the thigh before reconstituting as it exits the abductor canal. The popliteal artery is mildly diseased but remains patent. Runoff: Patent 3 vessel runoff to the ankle. Veins: No obvious venous abnormality within the limitations of this arterial phase study. Review of the MIP images confirms the  above findings. NON-VASCULAR Lower chest: Compared to the prior CT scan of the chest from October 12, 2016, and CT of the abdomen and pelvis 11/30/2019, there are is a new rounded nodule in the inferior aspect of the right middle lobe which measures 7 mm (image 5 series 5). There is subtle ground-glass attenuation airspace opacity around the periphery of the nodule. Similarly, there is an irregularly shaped nodule within the anterior aspect of the right lower lobe which measures 2.1 x 1.1 cm (image 10 series 5). This region was present on the more recent prior CT scan of the abdomen and pelvis dated 11/30/2019. A third slightly irregular nodular opacity is also visualized affiliated with the most inferior aspect of the major fissure just above the diaphragm. This is also new and measures 1.7 x 1.1 cm on image 15 of series 5. Centrilobular pulmonary emphysema. There is also likely a component of pan acinar emphysematous changes. Dependent atelectasis also present in the right lower lobe. Hepatobiliary: Heterogeneous appearance of the liver with slight atrophy of the right hepatic lobe and relative hypertrophy of the caudate and left hepatic lobe. There is subtle lobularity of the liver contour and a suggestion of recanalization of a paraumbilical vein. Overall, these findings suggest underlying hepatic cirrhosis. Additionally, there is trace perihepatic ascites. The gallbladder is surgically absent. No intra or extrahepatic biliary ductal dilatation. No discrete hepatic mass. Pancreas: Unremarkable. No pancreatic ductal dilatation or surrounding inflammatory changes. Spleen: Punctate calcifications in the spleen consistent with old granulomatous disease. The spleen remains normal in size. Adrenals/Urinary Tract: Adreniform thickening of the adrenal glands bilaterally. Bilateral circumscribed water attenuation cystic lesions in the kidneys consistent with simple renal cysts. A lesion arising from the upper pole of the  left kidney demonstrates layering milk of calcium within the cyst. No evidence of enhancement or nodularity. No hydronephrosis. However, there are punctate calcifications throughout the renal pyramids bilaterally suggestive of medullary nephrocalcinosis. Additionally, there are several renal stones. On the right, the largest measures 9 mm in the lower pole collecting system. On the left, the largest measures 7 mm in the interpolar collecting system. The ureters are unremarkable. The bladder wall is mildly thickened with a trabeculated appearance. Stomach/Bowel: No focal bowel wall thickening or evidence of obstruction. Normal appendix. Lymphatic: No suspicious intra-abdominal or retroperitoneal lymphadenopathy. Hyperenhancing and asymmetrically enlarged lymph nodes are present in the right groin in the   superficial inguinal nodal station. These are almost certainly reactive. Reproductive: Prostatomegaly. Other: Circumscribed peripherally enhancing fluid collection in the subcutaneous fat of the medial mid thigh measures 2.3 x 1.7 by 5.5 cm. Skin thickening and fairly extensive reticulation of the superficial subcutaneous fat along the right thigh involving the anterior, medial and posterior aspects of the thigh extending to the level of the knee. Musculoskeletal: No acute fracture or aggressive appearing lytic or blastic osseous lesion. IMPRESSION: VASCULAR 1. Imaging findings are concerning for potential superinfection of the right femoral to above the knee popliteal bypass graft. Phlegmonous changes are present about the proximal aspect of the graft just beyond the anastomosis, and again when the graft exits the adductor musculature in the popliteal fossa. Additionally, there is evidence of cellulitis involving entire right thigh (primarily anterior, medial and posterior) with a focal 2.3 x 1.7 x 5.5 cm fluid collection concerning for abscess in the medial mid thigh. Of note, this abscess is in the superficial  adipose tissue and not adjacent to the graft. 2. The bypass graft itself remains widely patent without evidence of deterioration at the anastomoses or evidence of mural thrombus formation. 3. Widely patent right common and external iliac artery stent. 4. Abdominal aortic aneurysm with a maximal transverse diameter of 3.1 cm. Recommend followup by ultrasound in 3 years. This recommendation follows ACR consensus guidelines: White Paper of the ACR Incidental Findings Committee II on Vascular Findings. J Am Coll Radiol 2013; 10:789-794. 5. Irregular fibrofatty atherosclerotic plaque throughout the abdominal aorta with a small penetrating atherosclerotic ulcer along the posterior wall of the visceral aorta between the origins of the celiac and superior mesenteric arteries. 6. Mildly aneurysmal right common iliac artery with a maximal diameter of 2.2 cm. 7. Ectatic left common iliac artery with a maximal diameter of 1.7 cm. 8. High-grade stenosis of the origin of the left internal iliac artery. 9. Moderate stenoses of the bilateral renal arteries. 10. 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 NON-VASCULAR 1. New areas of irregular nodularity in the inferior aspect of the right middle and lower lobes compared to prior CT imaging from 11/30/2019. Given the relatively rapid development, an infectious/inflammatory process is favored. Pneumonia, or septic emboli (particularly given the areas of infection suspected in the right thigh) are considerations. Malignancy is possible, but considered less likely. Of note, 1 of the 3 nodular areas was present on the prior CT scan and would be more concerning for an underlying neoplastic process. Recommend follow-up CT scan of the chest in 4-6 weeks following an appropriate course of therapy to confirm resolution/stability. 2. Reactive lymphadenopathy in the right superficial inguinal nodal station. 3. Prostatomegaly with probable mild bladder wall trabeculation  indicating an element of bladder outlet obstruction. 4. Hepatic cirrhosis with trace ascites. 5. 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 6. Bilateral simple renal cysts at least 1 of which contains layering milk of calcium. 7. Additional ancillary findings as above. These results were called by telephone at the time of interpretation on 01/25/2020 at 9:49 am to provider Servando Snare, who verbally acknowledged these results. Signed, Criselda Peaches, MD, [facility] Electronically Signed   By: Jacqulynn Cadet M.D.   On: 01/25/2020 09:49   Scheduled Meds: . amLODipine  2.5 mg Oral Daily  . aspirin EC  81 mg Oral Daily  . clopidogrel  75 mg Oral Q breakfast  . rosuvastatin  20 mg Oral Daily  . sucralfate  1 g Oral TID WC & HS  . vitamin B-12  1,000 mcg Oral Daily  . Vitamin D (Ergocalciferol)  50,000 Units Oral Once per day on Mon Thu   Continuous Infusions: . 0.9 % NaCl with KCl 20 mEq / L 75 mL/hr at 01/25/20 0914  . ceFEPime (MAXIPIME) IV 2 g (01/25/20 0916)  . vancomycin 750 mg (01/25/20 0316)     LOS: 2 days   Time spent: 25   A , MD [facility]  To contact the attending provider between 7A-7P or the covering provider during after hours 7P-7A, please log into the web site [email or web address] and access using universal [place] password for that web site. If you do not have the password, please call the hospital operator.  01/25/2020, 9:59 AM   

## 2020-01-25 NOTE — Progress Notes (Signed)
   I evaluated patient at bedside.  Unfortunately has necrotic changes of his fourth toe and previous second and third toe amputation site.  Will need this revised to likely transmetatarsal amputation.  He is also tender on the medial aspect of his thigh and I reviewed his CT scan demonstrates fluid collection there.  Cannot definitively tell if his bypass is infected although would assume so given inflammatory changes near both anastomoses.  We will plan to remove graft placed CryoVein and drain his fluid collection in his thigh under ultrasound guidance tomorrow in the OR and also convert his foot to transmetatarsal amputation.  Mayrani Khamis C. Donzetta Matters, MD [facility] Office: [phone] Pager: [phone]

## 2020-01-25 NOTE — Progress Notes (Signed)
VASCULAR LAB PRELIMINARY  PRELIMINARY  PRELIMINARY  PRELIMINARY  Upper extremity vein mapping completed.    Preliminary report:  See CV proc for preliminary results.   Daishawn Lauf, RVT 01/25/2020, 10:18 AM

## 2020-01-25 NOTE — Anesthesia Preprocedure Evaluation (Addendum)
Anesthesia Evaluation  Patient identified by MRN, date of birth, ID band Patient awake    Reviewed: Allergy & Precautions, NPO status   Airway Mallampati: II  TM Distance: >3 FB     Dental   Pulmonary Current Smoker,    breath sounds clear to auscultation       Cardiovascular hypertension, + CAD and + Peripheral Vascular Disease   Rhythm:Regular Rate:Normal     Neuro/Psych    GI/Hepatic negative GI ROS, Neg liver ROS,   Endo/Other  diabetes  Renal/GU      Musculoskeletal   Abdominal   Peds  Hematology   Anesthesia Other Findings   Reproductive/Obstetrics                            Anesthesia Physical Anesthesia Plan  ASA: III  Anesthesia Plan: General   Post-op Pain Management:    Induction: Intravenous  PONV Risk Score and Plan: 2 and Ondansetron, Dexamethasone and Midazolam  Airway Management Planned: Oral ETT  Additional Equipment:   Intra-op Plan:   Post-operative Plan: Extubation in OR  Informed Consent: I have reviewed the patients History and Physical, chart, labs and discussed the procedure including the risks, benefits and alternatives for the proposed anesthesia with the patient or authorized representative who has indicated his/her understanding and acceptance.     Dental advisory given  Plan Discussed with: Anesthesiologist and CRNA  Anesthesia Plan Comments:        Anesthesia Quick Evaluation

## 2020-01-25 NOTE — Plan of Care (Signed)
  Problem: Activity: Goal: Risk for activity intolerance will decrease Outcome: Progressing   Problem: Nutrition: Goal: Adequate nutrition will be maintained Outcome: Progressing   Problem: Coping: Goal: Level of anxiety will decrease Outcome: Progressing   Problem: Pain Managment: Goal: General experience of comfort will improve Outcome: Progressing   Problem: Pain Managment: Goal: General experience of comfort will improve Outcome: Progressing   Problem: Safety: Goal: Ability to remain free from injury will improve Outcome: Progressing   Problem: Safety: Goal: Ability to remain free from injury will improve Outcome: Progressing   Problem: Skin Integrity: Goal: Risk for impaired skin integrity will decrease Outcome: Progressing

## 2020-01-25 NOTE — Evaluation (Signed)
Physical Therapy Evaluation Patient Details Name: Raymond Walker. MRN: [id number] DOB: 02/14/1951 Today's Date: 01/25/2020   History of Present Illness  69 yo male admitted from home on 2/6 for AMS and R foot injury, pt found laying on couch soiled with urine by friend. Pt with sespsis secondary to suspected graft infection, along with necrotic changes to R distal foot (transmet amputation, drain placement for infected RLE planned 2/9). PMH includes fem-pop graft 08/2019, R great and second toe amputations, COPD, HTN, pancreatitis, thrombocytopenia.  Clinical Impression   Pt presents with [facility] LE strength, impulsivity, lack of safety awareness especially with RLE care, and decreased activity tolerance. Pt to benefit from acute PT to address deficits. Pt ambulated room distance this session, refused gait belt and AD pt stating "I don't need anything". PT expressed safety concerns given pt's current set up (intermittent assist, not tending to RLE), and pt's niece said they are open to ST-SNF, vs home if pt progresses well and has more assist. PT to progress mobility as tolerated, and will continue to follow acutely.      Follow Up Recommendations SNF;Supervision/Assistance - 24 hour(Vs home with HHPT/HHRN/HHOT)    Equipment Recommendations  None recommended by PT    Recommendations for Other Services       Precautions / Restrictions Precautions Precautions: Fall Precaution Comments: R foot ulceration - secondary to burning foot on space heater while he was sleeping per pt. Restrictions Weight Bearing Restrictions: No      Mobility  Bed Mobility Overal bed mobility: Needs Assistance Bed Mobility: Supine to Sit     Supine to sit: Supervision;HOB elevated     General bed mobility comments: supervision for safety, pt moves swiftly and does not require physical assist.  Transfers Overall transfer level: Needs assistance Equipment used: None Transfers: Sit to/from Stand Sit to  Stand: Supervision         General transfer comment: supervision for safety, verbal cuing for upright standing.  Ambulation/Gait Ambulation/Gait assistance: Min guard Gait Distance (Feet): 25 Feet Assistive device: None Gait Pattern/deviations: Step-through pattern;Decreased stride length;Decreased weight shift to right Gait velocity: WFL   General Gait Details: Min guard for safety, pt moves quickly and impulsively.  Stairs            Wheelchair Mobility    Modified Rankin (Stroke Patients Only)       Balance Overall balance assessment: Needs assistance   Sitting balance-Leahy Scale: Good     Standing balance support: During functional activity;No upper extremity supported Standing balance-Leahy Scale: Fair Standing balance comment: able to ambulate without UE support, requires close guard for safety                             Pertinent Vitals/Pain Pain Assessment: No/denies pain Faces Pain Scale: No hurt Pain Location: R foot Pain Intervention(s): Limited activity within patient's tolerance;Monitored during session;Repositioned    Home Living Family/patient expects to be discharged to:: Private residence Living Arrangements: Alone Available Help at Discharge: Family;Friend(s);Neighbor;Available PRN/intermittently Type of Home: House Home Access: Stairs to enter Entrance Stairs-Rails: Right Entrance Stairs-Number of Steps: 4 Home Layout: Two level;Able to live on main level with bedroom/bathroom Home Equipment: [name] - single point;Shower seat;Grab bars - tub/shower;Walker - 4 wheels      Prior Function Level of Independence: Independent         Comments: pt reports not using AD to ambulate, states he is independent in dressing, bathing,  cooking, cleaning.     Hand Dominance   Dominant Hand: Right    Extremity/Trunk Assessment   Upper Extremity Assessment Upper Extremity Assessment: Overall WFL for tasks assessed    Lower  Extremity Assessment Lower Extremity Assessment: Overall WFL for tasks assessed;RLE deficits/detail RLE Sensation: decreased light touch    Cervical / Trunk Assessment Cervical / Trunk Assessment: Normal  Communication   Communication: No difficulties  Cognition Arousal/Alertness: Awake/alert Behavior During Therapy: WFL for tasks assessed/performed;Impulsive Overall Cognitive Status: Impaired/Different from baseline Area of Impairment: Safety/judgement                         Safety/Judgement: Decreased awareness of safety;Decreased awareness of deficits     General Comments: A&O x4, appropriate throughout session but lacks safety awareness. Pt has not been dressing R foot ulceration, states it has been bleeding daily, and that he injured it by burning it on a space heater while he was sleeping.      General Comments General comments (skin integrity, edema, etc.): necrotic ulceration noted on R foot, not dressed in bandage and bleeding around edge of ulcer area.    Exercises     Assessment/Plan    PT Assessment Patient needs continued PT services  PT Problem List Decreased mobility;Decreased safety awareness;Decreased balance;Impaired sensation;Decreased activity tolerance       PT Treatment Interventions DME instruction;Gait training;Therapeutic exercise;Patient/family education;Therapeutic activities;Balance training;Stair training;Functional mobility training    PT Goals (Current goals can be found in the Care Plan section)  Acute Rehab PT Goals Patient Stated Goal: go home PT Goal Formulation: With patient/family Time For Goal Achievement: 02/08/20 Potential to Achieve Goals: Good    Frequency Min 3X/week   Barriers to discharge Decreased caregiver support      Co-evaluation               AM-PAC PT "6 Clicks" Mobility  Outcome Measure Help needed turning from your back to your side while in a flat bed without using bedrails?: A Little Help  needed moving from lying on your back to sitting on the side of a flat bed without using bedrails?: A Little Help needed moving to and from a bed to a chair (including a wheelchair)?: A Little Help needed standing up from a chair using your arms (e.g., wheelchair or bedside chair)?: A Little Help needed to walk in hospital room?: A Little Help needed climbing 3-5 steps with a railing? : A Lot 6 Click Score: 17    End of Session   Activity Tolerance: Patient tolerated treatment well;Patient limited by fatigue Patient left: in chair;with chair alarm set;with call bell/phone within reach;with family/visitor present Nurse Communication: Mobility status PT Visit Diagnosis: Unsteadiness on feet (R26.81);Difficulty in walking, not elsewhere classified (R26.2)    Time: 1202-1230 PT Time Calculation (min) (ACUTE ONLY): 28 min   Charges:   PT Evaluation $PT Eval Low Complexity: 1 Low PT Treatments $Gait Training: 8-22 mins        Estle Sabella E, PT Acute Rehabilitation Services Pager [phone]  Office [phone]   Topanga Alvelo D Elonda Husky 01/25/2020, 2:31 PM

## 2020-01-25 NOTE — Consult Note (Signed)
Referring Physician: [facility]  Patient name: Raymond Walker. MRN: [id number] DOB: [birth year] Sex: male  REASON FOR CONSULT: possible right leg fem pop infection  HPI: Raymond Walker. is a 69 y.o. male, s/p right fem AK pop with PTFE by Dr Donzetta Matters 09/15/19.  Pt was found unresponsive in his home about 48 hr ago and initially admitted to [facility] with diagnosis of sepsis.  Covid test was negative.  Pt began to complain of pain in right thigh as he became more alert.  All of his post op pain had resolved until about 2 weeks ago when thigh and right hip pain began. He does not recall fever or chills.  He has had no drainage.  Worst pain is right inner groin.  Other medical problems include COPD, borderline diabetes hypertension all of which have been stable.  He is on Cefipime Vanc.  He is also on Plavix ASA.  Past Medical History:  Diagnosis Date  . COPD (chronic obstructive pulmonary disease) ([place])   . Erectile dysfunction   . History of kidney stones   . Hypertension   . Impaired glucose tolerance   . Pancreatitis, recurrent   . Thrombocytopenia ([place]) 08/10/2018   Staying in the low 100s will follow closely   Past Surgical History:  Procedure Laterality Date  . ABDOMINAL AORTOGRAM W/LOWER EXTREMITY Bilateral 08/20/2019   Procedure: ABDOMINAL AORTOGRAM W/LOWER EXTREMITY;  Surgeon: Marty Heck, MD;  Location: [facility];  Service: Cardiovascular;  Laterality: Bilateral;  . AMPUTATION Right 09/15/2019   Procedure: AMPUTATION RIGHT TOES One, Two, And Three;  Surgeon: Waynetta Sandy, MD;  Location: [facility];  Service: Vascular;  Laterality: Right;  . CATARACT EXTRACTION W/PHACO Right 05/02/2018   Procedure: CATARACT EXTRACTION PHACO AND INTRAOCULAR LENS PLACEMENT (IOC);  Surgeon: Baruch Goldmann, MD;  Location: [facility];  Service: Ophthalmology;  Laterality: Right;  CDE: 11.11  . CATARACT EXTRACTION W/PHACO Left 07/04/2018   Procedure: CATARACT  EXTRACTION PHACO AND INTRAOCULAR LENS PLACEMENT (IOC);  Surgeon: Baruch Goldmann, MD;  Location: [facility];  Service: Ophthalmology;  Laterality: Left;  CDE: 7.15  . CHOLECYSTECTOMY    . FEMORAL-POPLITEAL BYPASS GRAFT Right 09/15/2019   Procedure: Right BYPASS GRAFT FEMORAL to Above Knee POPLITEAL ARTERY;  Surgeon: Waynetta Sandy, MD;  Location: [facility];  Service: Vascular;  Laterality: Right;  . ORCHIECTOMY    . PERIPHERAL VASCULAR INTERVENTION Left 08/20/2019   Procedure: PERIPHERAL VASCULAR INTERVENTION;  Surgeon: Marty Heck, MD;  Location: [facility];  Service: Cardiovascular;  Laterality: Left;  common/external iliac  . VASECTOMY      Family History  Problem Relation Age of Onset  . Hypertension Father   . Coronary artery disease Father   . Coronary artery disease Mother   . Cancer Mother        Breast    SOCIAL HISTORY: Social History   Socioeconomic History  . Marital status: Legally Separated    Spouse name: Not on file  . Number of children: Not on file  . Years of education: Not on file  . Highest education level: Not on file  Occupational History  . Occupation: Art gallery manager: [employer]  Tobacco Use  . Smoking status: Current Every Day Smoker    Packs/day: 1.50    Types: Cigarettes  . Smokeless tobacco: Never Used  . Tobacco comment: stopped on admission to hospital on 08/19/19  Substance and Sexual Activity  .  Alcohol use: No  . Drug use: No  . Sexual activity: Not on file  Other Topics Concern  . Not on file  Social History Narrative  . Not on file   Social Determinants of Health   Financial Resource Strain:   . Difficulty of Paying Living Expenses: Not on file  Food Insecurity:   . Worried About Charity fundraiser in the Last Year: Not on file  . Ran Out of Food in the Last Year: Not on file  Transportation Needs:   . Lack of Transportation (Medical): Not on file  . Lack of Transportation (Non-Medical):  Not on file  Physical Activity:   . Days of Exercise per Week: Not on file  . Minutes of Exercise per Session: Not on file  Stress:   . Feeling of Stress : Not on file  Social Connections:   . Frequency of Communication with Friends and Family: Not on file  . Frequency of Social Gatherings with Friends and Family: Not on file  . Attends Religious Services: Not on file  . Active Member of Clubs or Organizations: Not on file  . Attends Archivist Meetings: Not on file  . Marital Status: Not on file  Intimate Partner Violence:   . Fear of Current or Ex-Partner: Not on file  . Emotionally Abused: Not on file  . Physically Abused: Not on file  . Sexually Abused: Not on file    No Known Allergies  Current Facility-Administered Medications  Medication Dose Route Frequency Provider Last Rate Last Admin  . 0.9 % NaCl with KCl 20 mEq/ L  infusion   Intravenous Continuous Wynetta Emery, Clanford L, MD 75 mL/hr at 01/24/20 2114 New Bag at 01/24/20 2114  . acetaminophen (TYLENOL) tablet 650 mg  650 mg Oral Q4H PRN Wynetta Emery, Clanford L, MD   650 mg at 01/24/20 0617  . amLODipine (NORVASC) tablet 2.5 mg  2.5 mg Oral Daily Johnson, Clanford L, MD   2.5 mg at 01/24/20 0932  . aspirin EC tablet 81 mg  81 mg Oral Daily Oswald Hillock, MD   81 mg at 01/24/20 0934  . ceFEPIme (MAXIPIME) 2 g in sodium chloride 0.9 % 100 mL IVPB  2 g Intravenous Q8H Darrick Meigs, Marge Duncans, MD 200 mL/hr at 01/25/20 0120 2 g at 01/25/20 0120  . clopidogrel (PLAVIX) tablet 75 mg  75 mg Oral Q breakfast Oswald Hillock, MD   75 mg at 01/24/20 0932  . ketorolac (TORADOL) 15 MG/ML injection 15 mg  15 mg Intravenous Q6H PRN Wynetta Emery, Clanford L, MD   15 mg at 01/24/20 2009  . LORazepam (ATIVAN) injection 0.5 mg  0.5 mg Intravenous Q4H PRN Oswald Hillock, MD   0.5 mg at 01/23/20 0217  . ondansetron (ZOFRAN) tablet 4 mg  4 mg Oral Q6H PRN Oswald Hillock, MD       Or  . ondansetron (ZOFRAN) injection 4 mg  4 mg Intravenous Q6H PRN Oswald Hillock, MD      . rosuvastatin (CRESTOR) tablet 20 mg  20 mg Oral Daily Oswald Hillock, MD   20 mg at 01/24/20 0932  . sucralfate (CARAFATE) tablet 1 g  1 g Oral TID WC & HS Oswald Hillock, MD   1 g at 01/24/20 2126  . vancomycin (VANCOREADY) IVPB 750 mg/150 mL  750 mg Intravenous Q12H Johnson, Clanford L, MD 150 mL/hr at 01/25/20 0316 750 mg at 01/25/20 0316  . vitamin B-12 (  CYANOCOBALAMIN) tablet 1,000 mcg  1,000 mcg Oral Daily Johnson, Clanford L, MD      . Vitamin D (Ergocalciferol) (DRISDOL) capsule 50,000 Units  50,000 Units Oral Once per day on Mon Thu Johnson, Clanford L, MD        ROS:   General:  No weight loss, Fever, chills  HEENT: No recent headaches, no nasal bleeding, no visual changes, no sore throat  Neurologic: No dizziness, blackouts, seizures. No recent symptoms of stroke or mini- stroke. No recent episodes of slurred speech, or temporary blindness.  Cardiac: No recent episodes of chest pain/pressure, no shortness of breath at rest.  + shortness of breath with exertion.  Denies history of atrial fibrillation or irregular heartbeat  Vascular: No history of rest pain in feet.  No history of claudication.  + history of non-healing ulcer, No history of DVT   Pulmonary: No home oxygen, no productive cough, no hemoptysis,  No asthma or wheezing  Musculoskeletal:  [ ]  Arthritis, [ ]  Low back pain,  [ ]  Joint pain  Hematologic:No history of hypercoagulable state.  No history of easy bleeding.  No history of anemia  Gastrointestinal: No hematochezia or melena,  No gastroesophageal reflux, no trouble swallowing  Urinary: [ ]  chronic Kidney disease, [ ]  on HD - [ ]  MWF or [ ]  TTHS, [ ]  Burning with urination, [ ]  Frequent urination, [ ]  Difficulty urinating;   Skin: No rashes  Psychological: No history of anxiety,  No history of depression   Physical Examination  Vitals:   01/24/20 1414 01/24/20 1642 01/24/20 2039 01/25/20 0639  BP: 134/72 105/88 (!) 148/66 (!) 191/175    Pulse: 67 75 (!) 59 60  Resp: 16 (!) 26    Temp: 99 F (37.2 C) 98 F (36.7 C) 98.3 F (36.8 C) 98.1 F (36.7 C)  TempSrc: Other (Comment) Oral Oral Oral  SpO2: 97% 94% 100% 100%  Weight:      Height:        Body mass index is 20.81 kg/m.  General:  Alert and oriented, no acute distress HEENT: Normal Neck: No  JVD Pulmonary: Clear to auscultation bilaterally Cardiac: Regular Rate and Rhythm  Abdomen: Soft, non-tender, non-distended, no mass Skin: Macular patches of erythema scatter over right medial and lateral thigh, area over right lateral thigh is most tender Extremity Pulses:  2+ radial, brachial, femoral, absent dorsalis pedis, 1+ right posterior tibial pulse absent left PTpulse Musculoskeletal: dry eschar over right first toe amp site Neurologic: Upper and lower extremity motor 5/5 and symmetric  DATA:  CBC    Component Value Date/Time   WBC 5.2 01/24/2020 0706   RBC 3.68 (L) 01/24/2020 0706   HGB 10.4 (L) 01/24/2020 0706   HGB 14.0 10/29/2019 1421   HCT 33.1 (L) 01/24/2020 0706   HCT 43.0 10/29/2019 1421   PLT 93 (L) 01/24/2020 0706   PLT 155 10/29/2019 1421   MCV 89.9 01/24/2020 0706   MCV 88 10/29/2019 1421   MCH 28.3 01/24/2020 0706   MCHC 31.4 01/24/2020 0706   RDW 14.9 01/24/2020 0706   RDW 14.1 10/29/2019 1421   LYMPHSABS 1.0 01/24/2020 0706   LYMPHSABS 1.9 10/29/2019 1421   MONOABS 0.6 01/24/2020 0706   EOSABS 0.0 01/24/2020 0706   EOSABS 0.1 10/29/2019 1421   BASOSABS 0.0 01/24/2020 0706   BASOSABS 0.0 10/29/2019 1421    BMET    Component Value Date/Time   NA 143 01/25/2020 0320   NA 142 10/29/2019 1421  K 3.5 01/25/2020 0320   CL 106 01/25/2020 0320   CO2 25 01/25/2020 0320   GLUCOSE 100 (H) 01/25/2020 0320   BUN 12 01/25/2020 0320   BUN 14 10/29/2019 1421   CREATININE 0.72 01/25/2020 0320   CREATININE 0.70 08/01/2018 1126   CALCIUM 8.3 (L) 01/25/2020 0320   GFRNONAA >60 01/25/2020 0320   GFRNONAA 97 08/01/2018 1126   GFRAA >60  01/25/2020 0320   GFRAA 112 08/01/2018 1126    ASSESSMENT:  Possible infected right fem pop   PLAN:  1 CTA with runoff today  2. Vein map arms  Will d/w Dr Butch Penny, MD [facility] Office: [phone] Pager: [phone]

## 2020-01-26 ENCOUNTER — Encounter (HOSPITAL_COMMUNITY): Payer: Self-pay | Admitting: Family Medicine

## 2020-01-26 ENCOUNTER — Encounter (HOSPITAL_COMMUNITY)
Admission: EM | Disposition: A | Discharge status: Skilled Nursing Facility | Payer: Self-pay | Source: Home / Self Care | Attending: Internal Medicine

## 2020-01-26 ENCOUNTER — Inpatient Hospital Stay (HOSPITAL_COMMUNITY): Payer: Medicare Other | Admitting: Anesthesiology

## 2020-01-26 DIAGNOSIS — Z95820 Peripheral vascular angioplasty status with implants and grafts: Secondary | ICD-10-CM

## 2020-01-26 DIAGNOSIS — I96 Gangrene, not elsewhere classified: Secondary | ICD-10-CM

## 2020-01-26 DIAGNOSIS — I739 Peripheral vascular disease, unspecified: Secondary | ICD-10-CM

## 2020-01-26 DIAGNOSIS — Z8619 Personal history of other infectious and parasitic diseases: Secondary | ICD-10-CM

## 2020-01-26 DIAGNOSIS — L02415 Cutaneous abscess of right lower limb: Secondary | ICD-10-CM

## 2020-01-26 DIAGNOSIS — T827XXA Infection and inflammatory reaction due to other cardiac and vascular devices, implants and grafts, initial encounter: Secondary | ICD-10-CM

## 2020-01-26 DIAGNOSIS — M869 Osteomyelitis, unspecified: Secondary | ICD-10-CM

## 2020-01-26 DIAGNOSIS — R918 Other nonspecific abnormal finding of lung field: Secondary | ICD-10-CM

## 2020-01-26 DIAGNOSIS — F17211 Nicotine dependence, cigarettes, in remission: Secondary | ICD-10-CM

## 2020-01-26 DIAGNOSIS — Z89421 Acquired absence of other right toe(s): Secondary | ICD-10-CM

## 2020-01-26 HISTORY — PX: TRANSMETATARSAL AMPUTATION: SHX6197

## 2020-01-26 HISTORY — PX: FEMORAL-POPLITEAL BYPASS GRAFT: SHX937

## 2020-01-26 LAB — CBC
HCT: 33.5 % — ABNORMAL LOW (ref 39.0–52.0)
Hemoglobin: 10.9 g/dL — ABNORMAL LOW (ref 13.0–17.0)
MCH: 28.1 pg (ref 26.0–34.0)
MCHC: 32.5 g/dL (ref 30.0–36.0)
MCV: 86.3 fL (ref 80.0–100.0)
Platelets: 102 10*3/uL — ABNORMAL LOW (ref 150–400)
RBC: 3.88 MIL/uL — ABNORMAL LOW (ref 4.22–5.81)
RDW: 14.6 % (ref 11.5–15.5)
WBC: 4.5 10*3/uL (ref 4.0–10.5)
nRBC: 0 % (ref 0.0–0.2)

## 2020-01-26 LAB — COMPREHENSIVE METABOLIC PANEL
ALT: 14 U/L (ref 0–44)
AST: 16 U/L (ref 15–41)
Albumin: 2.6 g/dL — ABNORMAL LOW (ref 3.5–5.0)
Alkaline Phosphatase: 60 U/L (ref 38–126)
Anion gap: 8 (ref 5–15)
BUN: 9 mg/dL (ref 8–23)
CO2: 25 mmol/L (ref 22–32)
Calcium: 8.4 mg/dL — ABNORMAL LOW (ref 8.9–10.3)
Chloride: 107 mmol/L (ref 98–111)
Creatinine, Ser: 0.71 mg/dL (ref 0.61–1.24)
GFR calc Af Amer: >60 mL/min (ref >60)
GFR calc non Af Amer: >60 mL/min (ref >60)
Glucose, Bld: 101 mg/dL — ABNORMAL HIGH (ref 70–99)
Potassium: 3.5 mmol/L (ref 3.5–5.1)
Sodium: 140 mmol/L (ref 135–145)
Total Bilirubin: 0.6 mg/dL (ref 0.3–1.2)
Total Protein: 6.1 g/dL — ABNORMAL LOW (ref 6.5–8.1)

## 2020-01-26 LAB — URINALYSIS, ROUTINE W REFLEX MICROSCOPIC
Bilirubin Urine: NEGATIVE
Glucose, UA: 50 mg/dL — AB
Ketones, ur: NEGATIVE mg/dL
Leukocytes,Ua: NEGATIVE
Nitrite: NEGATIVE
Protein, ur: 30 mg/dL — AB
RBC / HPF: >50 RBC/hpf — ABNORMAL HIGH (ref 0–5)
Specific Gravity, Urine: 1.021 (ref 1.005–1.030)
pH: 6 (ref 5.0–8.0)

## 2020-01-26 SURGERY — BYPASS GRAFT FEMORAL-POPLITEAL ARTERY
Anesthesia: General | Site: Toe | Laterality: Right

## 2020-01-26 MED ORDER — LIDOCAINE 2% (20 MG/ML) 5 ML SYRINGE
INTRAMUSCULAR | Status: DC | PRN
  Administered 2020-01-26: 100 mg via INTRAVENOUS

## 2020-01-26 MED ORDER — DEXAMETHASONE SODIUM PHOSPHATE 10 MG/ML IJ SOLN
INTRAMUSCULAR | Status: DC | PRN
  Administered 2020-01-26: 10 mg via INTRAVENOUS

## 2020-01-26 MED ORDER — SODIUM CHLORIDE 0.9 % IV SOLN
2.0000 g | INTRAVENOUS | Status: DC
  Filled 2020-01-26: qty 2

## 2020-01-26 MED ORDER — MORPHINE SULFATE (PF) 2 MG/ML IV SOLN
2.0000 mg | INTRAVENOUS | Status: DC | PRN
  Administered 2020-01-29: 19:00:00 2 mg via INTRAVENOUS
  Filled 2020-01-26: qty 1

## 2020-01-26 MED ORDER — POTASSIUM CHLORIDE IN NACL 20-0.9 MEQ/L-% IV SOLN
INTRAVENOUS | Status: DC
  Filled 2020-01-26 (×2): qty 1000

## 2020-01-26 MED ORDER — PHENYLEPHRINE HCL-NACL 10-0.9 MG/250ML-% IV SOLN
INTRAVENOUS | Status: DC | PRN
  Administered 2020-01-26: 30 ug/min via INTRAVENOUS

## 2020-01-26 MED ORDER — STERILE WATER FOR IRRIGATION IR SOLN
Status: DC | PRN
  Administered 2020-01-26: 1000 mL

## 2020-01-26 MED ORDER — OXYCODONE-ACETAMINOPHEN 5-325 MG PO TABS
1.0000 | ORAL_TABLET | ORAL | Status: DC | PRN
  Administered 2020-01-26 – 2020-01-30 (×11): 2 via ORAL
  Filled 2020-01-26 (×12): qty 2

## 2020-01-26 MED ORDER — SUGAMMADEX SODIUM 200 MG/2ML IV SOLN
INTRAVENOUS | Status: DC | PRN
  Administered 2020-01-26: 150 mg via INTRAVENOUS

## 2020-01-26 MED ORDER — LIDOCAINE 2% (20 MG/ML) 5 ML SYRINGE
INTRAMUSCULAR | Status: AC
  Filled 2020-01-26: qty 5

## 2020-01-26 MED ORDER — ALBUMIN HUMAN 5 % IV SOLN: INTRAVENOUS | Status: DC | PRN

## 2020-01-26 MED ORDER — PROPOFOL 10 MG/ML IV BOLUS
INTRAVENOUS | Status: DC | PRN
  Administered 2020-01-26: 120 mg via INTRAVENOUS

## 2020-01-26 MED ORDER — ONDANSETRON HCL 4 MG/2ML IJ SOLN
INTRAMUSCULAR | Status: DC | PRN
  Administered 2020-01-26: 4 mg via INTRAVENOUS

## 2020-01-26 MED ORDER — DEXAMETHASONE SODIUM PHOSPHATE 10 MG/ML IJ SOLN
INTRAMUSCULAR | Status: AC
  Filled 2020-01-26: qty 1

## 2020-01-26 MED ORDER — ROCURONIUM BROMIDE 50 MG/5ML IV SOSY
PREFILLED_SYRINGE | INTRAVENOUS | Status: DC | PRN
  Administered 2020-01-26: 50 mg via INTRAVENOUS
  Administered 2020-01-26: 30 mg via INTRAVENOUS

## 2020-01-26 MED ORDER — CHLORHEXIDINE GLUCONATE CLOTH 2 % EX PADS
6.0000 | MEDICATED_PAD | Freq: Once | CUTANEOUS | Status: AC
  Administered 2020-01-26: 08:00:00 6 via TOPICAL

## 2020-01-26 MED ORDER — ONDANSETRON HCL 4 MG/2ML IJ SOLN
INTRAMUSCULAR | Status: AC
  Filled 2020-01-26: qty 2

## 2020-01-26 MED ORDER — MIDAZOLAM HCL 2 MG/2ML IJ SOLN
INTRAMUSCULAR | Status: AC
  Filled 2020-01-26: qty 2

## 2020-01-26 MED ORDER — FENTANYL CITRATE (PF) 100 MCG/2ML IJ SOLN: 25.0000 ug | INTRAMUSCULAR | Status: DC | PRN

## 2020-01-26 MED ORDER — FENTANYL CITRATE (PF) 250 MCG/5ML IJ SOLN
INTRAMUSCULAR | Status: DC | PRN
  Administered 2020-01-26: 150 ug via INTRAVENOUS
  Administered 2020-01-26: 50 ug via INTRAVENOUS

## 2020-01-26 MED ORDER — SODIUM CHLORIDE 0.9 % IV SOLN
INTRAVENOUS | Status: AC
  Filled 2020-01-26: qty 1.2

## 2020-01-26 MED ORDER — SODIUM CHLORIDE 0.9 % IV SOLN
INTRAVENOUS | Status: DC | PRN
  Administered 2020-01-26: 500 mL

## 2020-01-26 MED ORDER — MIDAZOLAM HCL 5 MG/5ML IJ SOLN
INTRAMUSCULAR | Status: DC | PRN
  Administered 2020-01-26: 1 mg via INTRAVENOUS

## 2020-01-26 MED ORDER — LIDOCAINE HCL (PF) 1 % IJ SOLN
INTRAMUSCULAR | Status: AC
  Filled 2020-01-26: qty 30

## 2020-01-26 MED ORDER — 0.9 % SODIUM CHLORIDE (POUR BTL) OPTIME
TOPICAL | Status: DC | PRN
  Administered 2020-01-26: 1000 mL
  Administered 2020-01-26: 2000 mL

## 2020-01-26 MED ORDER — LACTATED RINGERS IV SOLN: INTRAVENOUS | Status: DC | PRN

## 2020-01-26 MED ORDER — FENTANYL CITRATE (PF) 250 MCG/5ML IJ SOLN
INTRAMUSCULAR | Status: AC
  Filled 2020-01-26: qty 5

## 2020-01-26 MED ORDER — ROCURONIUM BROMIDE 10 MG/ML (PF) SYRINGE
PREFILLED_SYRINGE | INTRAVENOUS | Status: AC
  Filled 2020-01-26: qty 10

## 2020-01-26 MED ORDER — PROPOFOL 10 MG/ML IV BOLUS
INTRAVENOUS | Status: AC
  Filled 2020-01-26: qty 40

## 2020-01-26 SURGICAL SUPPLY — 78 items
BANDAGE ESMARK 6X9 LF (GAUZE/BANDAGES/DRESSINGS) IMPLANT
BLADE AVERAGE 25X9 (BLADE) IMPLANT
BLADE SAW SGTL 81X20 HD (BLADE) ×3 IMPLANT
BNDG CONFORM 3 STRL LF (GAUZE/BANDAGES/DRESSINGS) IMPLANT
BNDG ELASTIC 4X5.8 VLCR STR LF (GAUZE/BANDAGES/DRESSINGS) ×3 IMPLANT
BNDG ESMARK 6X9 LF (GAUZE/BANDAGES/DRESSINGS)
BNDG GAUZE ELAST 4 BULKY (GAUZE/BANDAGES/DRESSINGS) ×3 IMPLANT
CANISTER SUCT 3000ML PPV (MISCELLANEOUS) ×3 IMPLANT
CANNULA VESSEL 3MM 2 BLNT TIP (CANNULA) ×3 IMPLANT
CLIP VESOCCLUDE MED 24/CT (CLIP) ×3 IMPLANT
CLIP VESOCCLUDE SM WIDE 24/CT (CLIP) ×3 IMPLANT
COVER PROBE W GEL 5X96 (DRAPES) ×3 IMPLANT
COVER SURGICAL LIGHT HANDLE (MISCELLANEOUS) ×3 IMPLANT
COVER WAND RF STERILE (DRAPES) IMPLANT
CUFF TOURN SGL QUICK 24 (TOURNIQUET CUFF)
CUFF TOURN SGL QUICK 34 (TOURNIQUET CUFF)
CUFF TOURN SGL QUICK 42 (TOURNIQUET CUFF) IMPLANT
CUFF TRNQT CYL 24X4X16.5-23 (TOURNIQUET CUFF) IMPLANT
CUFF TRNQT CYL 34X4.125X (TOURNIQUET CUFF) IMPLANT
DERMABOND ADVANCED (GAUZE/BANDAGES/DRESSINGS) ×1
DERMABOND ADVANCED .7 DNX12 (GAUZE/BANDAGES/DRESSINGS) ×2 IMPLANT
DRAIN CHANNEL 15F RND FF W/TCR (WOUND CARE) IMPLANT
DRAPE C-ARM 42X72 X-RAY (DRAPES) IMPLANT
DRAPE HALF SHEET 40X57 (DRAPES) IMPLANT
DRAPE X-RAY CASS 24X20 (DRAPES) IMPLANT
DRSG ADAPTIC 3X8 NADH LF (GAUZE/BANDAGES/DRESSINGS) ×3 IMPLANT
DRSG PAD ABDOMINAL 8X10 ST (GAUZE/BANDAGES/DRESSINGS) ×3 IMPLANT
ELECT REM PT RETURN 9FT ADLT (ELECTROSURGICAL) ×3
ELECTRODE REM PT RTRN 9FT ADLT (ELECTROSURGICAL) ×2 IMPLANT
EVACUATOR SILICONE 100CC (DRAIN) IMPLANT
GAUZE SPONGE 4X4 12PLY STRL (GAUZE/BANDAGES/DRESSINGS) ×6 IMPLANT
GLOVE BIO SURGEON STRL SZ 6 (GLOVE) ×6 IMPLANT
GLOVE BIO SURGEON STRL SZ 6.5 (GLOVE) ×3 IMPLANT
GLOVE BIO SURGEON STRL SZ7.5 (GLOVE) ×6 IMPLANT
GLOVE BIOGEL PI IND STRL 6.5 (GLOVE) ×8 IMPLANT
GLOVE BIOGEL PI INDICATOR 6.5 (GLOVE) ×4
GOWN STRL REUS W/ TWL LRG LVL3 (GOWN DISPOSABLE) ×4 IMPLANT
GOWN STRL REUS W/ TWL XL LVL3 (GOWN DISPOSABLE) ×2 IMPLANT
GOWN STRL REUS W/TWL LRG LVL3 (GOWN DISPOSABLE) ×2
GOWN STRL REUS W/TWL XL LVL3 (GOWN DISPOSABLE) ×1
HEMOSTAT SNOW SURGICEL 2X4 (HEMOSTASIS) IMPLANT
INSERT FOGARTY SM (MISCELLANEOUS) ×3 IMPLANT
KIT BASIN OR (CUSTOM PROCEDURE TRAY) ×3 IMPLANT
KIT TURNOVER KIT B (KITS) ×3 IMPLANT
LOOP VESSEL MINI RED (MISCELLANEOUS) ×3 IMPLANT
MARKER GRAFT CORONARY BYPASS (MISCELLANEOUS) IMPLANT
NEEDLE HYPO 25GX1X1/2 BEV (NEEDLE) IMPLANT
NS IRRIG 1000ML POUR BTL (IV SOLUTION) ×9 IMPLANT
PACK GENERAL/GYN (CUSTOM PROCEDURE TRAY) IMPLANT
PACK PERIPHERAL VASCULAR (CUSTOM PROCEDURE TRAY) ×3 IMPLANT
PAD ARMBOARD 7.5X6 YLW CONV (MISCELLANEOUS) ×6 IMPLANT
SET COLLECT BLD 21X3/4 12 (NEEDLE) IMPLANT
SPECIMEN JAR SMALL (MISCELLANEOUS) IMPLANT
SPONGE LAP 18X18 RF (DISPOSABLE) ×6 IMPLANT
STAPLER VISISTAT 35W (STAPLE) ×3 IMPLANT
STOPCOCK 4 WAY LG BORE MALE ST (IV SETS) IMPLANT
SUT ETHILON 3 0 PS 1 (SUTURE) ×9 IMPLANT
SUT MNCRL AB 4-0 PS2 18 (SUTURE) ×3 IMPLANT
SUT PROLENE 5 0 C 1 24 (SUTURE) IMPLANT
SUT PROLENE 6 0 BV (SUTURE) ×3 IMPLANT
SUT PROLENE 7 0 BV 1 (SUTURE) IMPLANT
SUT SILK 2 0 SH (SUTURE) IMPLANT
SUT SILK 3 0 (SUTURE)
SUT SILK 3-0 18XBRD TIE 12 (SUTURE) IMPLANT
SUT VIC AB 2-0 CT1 27 (SUTURE)
SUT VIC AB 2-0 CT1 TAPERPNT 27 (SUTURE) IMPLANT
SUT VIC AB 3-0 SH 27 (SUTURE) ×3
SUT VIC AB 3-0 SH 27X BRD (SUTURE) ×6 IMPLANT
SWAB COLLECTION DEVICE MRSA (MISCELLANEOUS) ×3 IMPLANT
SWAB CULTURE ESWAB REG 1ML (MISCELLANEOUS) ×3 IMPLANT
SYR BULB IRRIGATION 50ML (SYRINGE) ×3 IMPLANT
SYR CONTROL 10ML LL (SYRINGE) IMPLANT
TAPE CLOTH SURG 4X10 WHT LF (GAUZE/BANDAGES/DRESSINGS) ×3 IMPLANT
TOWEL GREEN STERILE (TOWEL DISPOSABLE) ×6 IMPLANT
TOWEL GREEN STERILE FF (TOWEL DISPOSABLE) ×3 IMPLANT
TRAY FOLEY MTR SLVR 16FR STAT (SET/KITS/TRAYS/PACK) ×3 IMPLANT
UNDERPAD 30X30 (UNDERPADS AND DIAPERS) ×3 IMPLANT
WATER STERILE IRR 1000ML POUR (IV SOLUTION) ×3 IMPLANT

## 2020-01-26 NOTE — Transfer of Care (Signed)
Immediate Anesthesia Transfer of Care Note  Patient: Raymond Walker  Procedure(s) Performed: IRRIGATION AND DEBRIDEMENT RIGHT FEMORAL POPLITEAL BYPASS SITE (Right ) TRANSMETATARSAL AMPUTATION (Right Toe)  Patient Location: PACU  Anesthesia Type:General  Level of Consciousness: awake, alert  and oriented  Airway & Oxygen Therapy: Patient Spontanous Breathing  Post-op Assessment: Report given to RN and Post -op Vital signs reviewed and stable  Post vital signs: Reviewed and stable  Last Vitals:  Vitals Value Taken Time  BP 105/64 01/26/20 1110  Temp    Pulse 74 01/26/20 1113  Resp 14 01/26/20 1113  SpO2 100 % 01/26/20 1113  Vitals shown include unvalidated device data.  Last Pain:  Vitals:   01/26/20 0755  TempSrc: Oral  PainSc:          Complications: No apparent anesthesia complications

## 2020-01-26 NOTE — Progress Notes (Signed)
PT Cancellation Note  Patient Details Name: Raymond Walker. MRN: [id number] DOB: [birth year]   Cancelled Treatment:     Pt supine in bed upon therapist arrival, pt report he is too tired this afternoon and is declining bed level therapy and declining all OOB activity. Pt with recent transmet amputation earlier today. Pt reports "I will walk tomorrow, not today." Will check back later as time allows.    Netta Corrigan, PT, DPT, CSRS 01/26/2020, 4:26 PM

## 2020-01-26 NOTE — Progress Notes (Signed)
PROGRESS NOTE    Raymond Walker.  WER:[id number] DOB: 1951-01-28 DOA: 01/23/2020 PCP: [name], MD  Brief Narrative:  69 y.o.male,with history of COPD, hypertension, thrombocytopenia,s/p right femoral to above-knee pop bypass with toe amputationwith Dr. Donzetta Matters late September 2020was brought to hospital with altered mental statusand severe sepsis. Apparently neighbor found him on the couch, not moving, confused, incontinent of urine and feces. Patient's daughter had not seen him in a week. Patient was found to have bleeding from the right toe amputation site, lactic acid was 4.1.Patient was started on vancomycin and cefepime empirically for sepsis.  Interim history: Transtibial amputation right with right exploration fem/pop (no infection found) with right thigh I and D operatively 01/26/20 with Dr. Donzetta Matters. Continues on vancomycin and cefepime and will narrow as culture results return to determine optimal length and antibiotics to heal abscess.   Assessment & Plan:   Active Problems:   COPD (chronic obstructive pulmonary disease) (HCC)   Protein-calorie malnutrition, severe   Cellulitis of right lower extremity   PAD (peripheral artery disease) (HCC)   Sepsis (HCC)   Altered mental status   Hypokalemia   History of kidney stones   Hematuria   UTI (urinary tract infection)   CHRONIC Bladder outlet obstruction   Vitamin D deficiency  Sepsis : -Vascular surgery with transtibial amputation and right fem/pop exploration and right thigh abscess drainage today, cultures sent from thigh abscess  -continue vancomycin and cefepime for now, will narrow as appropriate guided by cultures -Noted possible pneumonia on CT as well which is covered by vancomycin and cefepime although no breathing complaints throughout stay.  -Blood cultures no growth to date 3 days. Urine culture no growth   Right thigh abscess: -I and D 01/26/20, currently on vancomycin and cefepime, follow cultures and  narrow as able -thankfully fem/pop bypass was not infection on surgical exploration  Acute metabolic encephalopathy -resolved, monitor closely  Question of atrial fibrillation: -not convinced that this was true atrial fibrillation suspect this was a sinus arrhythmia. -He has had no recurrence of this -stop telemetry as this is limiting sleep and mental status  Thrombocytopenia -suspect secondary to recent sepsis -Following  -Holding all heparin products  Severe vitamin D deficiency -supplement ordered  Hematuria - this is chronic, CT shows that he has renal stones -f/u urology as outpatient  Chronic bladder outlet obstruction from enlarged prostate  - I/O cath as needed if unable to void.  Hypomagnesemia -resolved  Lactic acidosis -resolved  Anemia of chronic disease-small drop in hemoglobin secondary to hemodilution. Following.  DVT prophylaxis: SCDs, aspirin and plavix Code Status: full Family Communication: niece HCPOA updated in room  Disposition Plan:  . Patient came from:home            . Anticipated d/c place:SNF likely maybe home health . Barriers to d/c OR conditions which need to be met to effect a safe d/c: transtibial amputation and right thigh abscess I and D done 01/26/20 needs appropriate post-op recovery. Also awaiting cultures from thigh abscess to help narrow antibiotics and determine appropriate length   Consultants:   Vascular surgery  Procedures:   01/26/20 Transtibial amputation right with right thigh abscess I and D and right fem/pop exploration Dr. Donzetta Matters  Antimicrobials:   Vancomycin start date 01/23/20  Cefepime start date 01/23/20   Subjective: Feeling okay after surgery. Spoke with niece in room and explained situation to her. Denies new complaints. Mild pain in the right leg. Denies chest pains, SOB, abdominal  pain. Denies nausea or vomiting.   Objective: Vitals:   01/26/20 1201 01/26/20 1215 01/26/20 1327 01/26/20 1330   BP: 91/66 90/72 (!) 111/58   Pulse: 73 71  71  Resp: (!) _0 Temp: 97.6 F (36.4 C)     TempSrc: Oral     SpO2: 98% 99%  97%  Weight:      Height:        Intake/Output Summary (Last 24 hours) at 01/26/2020 1401 Last data filed at 01/26/2020 1120 Gross per 24 hour  Intake 2327.07 ml  Output 2975 ml  Net -647.93 ml   Filed Weights   01/23/20 0106 01/23/20 0600  Weight: 65.8 kg 65.8 kg    Examination:  General exam: Appears calm and comfortable  Respiratory system: Clear to auscultation. Respiratory effort normal. Cardiovascular system: S1 & S2 heard, RRR. No JVD, murmurs, rubs, gallops or clicks. No pedal edema. Gastrointestinal system: Abdomen is nondistended, soft and nontender. No organomegaly or masses felt. Normal bowel sounds heard. Central nervous system: Alert and oriented. No focal neurological deficits. Extremities: right foot wrapped s/p transtibial amputation Skin: stable exam Psychiatry: Judgement and insight appear normal. Mood & affect appropriate.   Data Reviewed: I have personally reviewed following labs and imaging studies  CBC: Recent Labs  Lab 01/23/20 0118 01/23/20 0710 01/24/20 0706 01/26/20 0250  WBC 12.2* 9.8 5.2 4.5  NEUTROABS 10.8*  --  3.5  --   HGB 13.5 11.0* 10.4* 10.9*  HCT 41.5 34.4* 33.1* 33.5*  MCV 88.1 88.9 89.9 86.3  PLT 125* 99* 93* 606*   Basic Metabolic Panel: Recent Labs  Lab 01/23/20 0118 01/24/20 0706 01/25/20 0320 01/26/20 0250  NA 135 138 143 140  K 3.1* 3.8 3.5 3.5  CL 95* 107 106 107  CO2 _1 GLUCOSE 171* 104* 100* 101*  BUN _2 CREATININE 1.03 0.78 0.72 0.71  CALCIUM 9.0 8.1* 8.3* 8.4*  MG  --  1.4*  --   --    GFR: Estimated Creatinine Clearance: 81.1 mL/min (by C-G formula based on SCr of 0.71 mg/dL). Liver Function Tests: Recent Labs  Lab 01/23/20 0118 01/24/20 0706 01/25/20 0320 01/26/20 0250  AST _3 ALT _4 ALKPHOS 78 50 50 60  BILITOT 0.9 0.6 0.6  0.6  PROT 8.0 5.7* 5.5* 6.1*  ALBUMIN 3.6 2.5* 2.3* 2.6*   No results for input(s): LIPASE, AMYLASE in the last 168 hours. No results for input(s): AMMONIA in the last 168 hours. Coagulation Profile: Recent Labs  Lab 01/23/20 0118  INR 1.2   Cardiac Enzymes: Recent Labs  Lab 01/23/20 0118  CKTOTAL 502*   BNP (last 3 results) No results for input(s): PROBNP in the last 8760 hours. HbA1C: No results for input(s): HGBA1C in the last 72 hours. CBG: No results for input(s): GLUCAP in the last 168 hours. Lipid Profile: No results for input(s): CHOL, HDL, LDLCALC, TRIG, CHOLHDL, LDLDIRECT in the last 72 hours. Thyroid Function Tests: No results for input(s): TSH, T4TOTAL, FREET4, T3FREE, THYROIDAB in the last 72 hours. Anemia Panel: No results for input(s): VITAMINB12, FOLATE, FERRITIN, TIBC, IRON, RETICCTPCT in the last 72 hours. Sepsis Labs: Recent Labs  Lab 01/23/20 0118 01/23/20 0314 01/23/20 0916  LATICACIDVEN 4.1* 3.0* 1.6    Recent Results (from the past 240 hour(s))  Blood Culture (routine x 2)     Status: None (Preliminary result)   Collection Time:  01/23/20  1:19 AM   Specimen: Right Antecubital; Blood  Result Value Ref Range Status   Specimen Description RIGHT ANTECUBITAL  Final   Special Requests   Final    BOTTLES DRAWN AEROBIC AND ANAEROBIC Blood Culture adequate volume   Culture   Final    NO GROWTH 3 DAYS Performed at [facility], [address] [place], [place] [address]    Report Status PENDING  Incomplete  Blood Culture (routine x 2)     Status: None (Preliminary result)   Collection Time: 01/23/20  1:23 AM   Specimen: Left Antecubital; Blood  Result Value Ref Range Status   Specimen Description LEFT ANTECUBITAL  Final   Special Requests   Final    BOTTLES DRAWN AEROBIC AND ANAEROBIC Blood Culture adequate volume   Culture   Final    NO GROWTH 3 DAYS Performed at [facility], [address], [place], [place] [address]    Report Status  PENDING  Incomplete  Respiratory Panel by RT PCR (Flu A&B, Covid) - Nasopharyngeal Swab     Status: None   Collection Time: 01/23/20  2:00 AM   Specimen: Nasopharyngeal Swab  Result Value Ref Range Status   SARS Coronavirus 2 by RT PCR NEGATIVE NEGATIVE Final    Comment: (NOTE) SARS-CoV-2 target nucleic acids are NOT DETECTED. The SARS-CoV-2 RNA is generally detectable in upper respiratoy specimens during the acute phase of infection. The lowest concentration of SARS-CoV-2 viral copies this assay can detect is 131 copies/mL. A negative result does not preclude SARS-Cov-2 infection and should not be used as the sole basis for treatment or other patient management decisions. A negative result may occur with  improper specimen collection/handling, submission of specimen other than nasopharyngeal swab, presence of viral mutation(s) within the areas targeted by this assay, and inadequate number of viral copies (<131 copies/mL). A negative result must be combined with clinical observations, patient history, and epidemiological information. The expected result is Negative. Fact Sheet for Patients:  PinkCheek.be Fact Sheet for Healthcare Providers:  GravelBags.it This test is not yet ap proved or cleared by the [place] FDA and  has been authorized for detection and/or diagnosis of SARS-CoV-2 by FDA under an Emergency Use Authorization (EUA). This EUA will remain  in effect (meaning this test can be used) for the duration of the COVID-19 declaration under Section 564(b)(1) of the Act, 21 U.S.C. section 360bbb-3(b)(1), unless the authorization is terminated or revoked sooner.    Influenza A by PCR NEGATIVE NEGATIVE Final   Influenza B by PCR NEGATIVE NEGATIVE Final    Comment: (NOTE) The Xpert Xpress SARS-CoV-2/FLU/RSV assay is intended as an aid in  the diagnosis of influenza from Nasopharyngeal swab specimens and  should not  be used as a sole basis for treatment. Nasal washings and  aspirates are unacceptable for Xpert Xpress SARS-CoV-2/FLU/RSV  testing. Fact Sheet for Patients: PinkCheek.be Fact Sheet for Healthcare Providers: GravelBags.it This test is not yet approved or cleared by the [place] FDA and  has been authorized for detection and/or diagnosis of SARS-CoV-2 by  FDA under an Emergency Use Authorization (EUA). This EUA will remain  in effect (meaning this test can be used) for the duration of the  Covid-19 declaration under Section 564(b)(1) of the Act, 21  U.S.C. section 360bbb-3(b)(1), unless the authorization is  terminated or revoked. Performed at [facility], [address], [place], [place] [address]   Urine culture     Status: None   Collection  Time: 01/23/20  2:30 AM   Specimen: In/Out Cath Urine  Result Value Ref Range Status   Specimen Description   Final    IN/OUT CATH URINE Performed at [facility], [address] [place], [place] [address]    Special Requests   Final    NONE Performed at [facility], [address] [place], [place] [address]    Culture   Final    NO GROWTH Performed at [facility], [place] [address] [place], [place] [address]    Report Status 01/24/2020 FINAL  Final  MRSA PCR Screening     Status: None   Collection Time: 01/23/20  7:44 AM   Specimen: Nasal Mucosa; Nasopharyngeal  Result Value Ref Range Status   MRSA by PCR NEGATIVE NEGATIVE Final    Comment:        The GeneXpert MRSA Assay (FDA approved for NASAL specimens only), is one component of a comprehensive MRSA colonization surveillance program. It is not intended to diagnose MRSA infection nor to guide or monitor treatment for MRSA infections. Performed at [facility], [address] [place], [place] [address]   Surgical pcr screen     Status: None   Collection Time: 01/25/20  8:59 PM   Specimen: Nasal Mucosa; Nasal  Swab  Result Value Ref Range Status   MRSA, PCR NEGATIVE NEGATIVE Final   Staphylococcus aureus NEGATIVE NEGATIVE Final    Comment: (NOTE) The Xpert SA Assay (FDA approved for NASAL specimens in patients 47 years of age and older), is one component of a comprehensive surveillance program. It is not intended to diagnose infection nor to guide or monitor treatment. Performed at [facility], [place] [address], [place], [place] [address]     Radiology Studies: CT ANGIO AO+BIFEM W & OR WO CONTRAST  Result Date: 01/25/2020 CLINICAL DATA:  69 year old male with acute right lower extremity pain. Suspect infection. History of prior revascularization. EXAM: CT ANGIOGRAPHY OF ABDOMINAL AORTA WITH ILIOFEMORAL RUNOFF TECHNIQUE: Multidetector CT imaging of the abdomen, pelvis and lower extremities was performed using the standard protocol during bolus administration of intravenous contrast. Multiplanar CT image reconstructions and MIPs were obtained to evaluate the vascular anatomy. CONTRAST:  186m OMNIPAQUE IOHEXOL 350 MG/ML SOLN COMPARISON:  CT scan of the abdomen and pelvis 11/30/2019; CT scan of the chest 10/12/2016. FINDINGS: VASCULAR Aorta: Extensive heterogeneous calcified and noncalcified atherosclerotic plaque throughout the abdominal aorta. There is a small penetrating atherosclerotic ulcer arising from the posterior aspect of the visceral aorta just after the origin the celiac artery. The aorta measures a maximum of 3.1 cm at this location. Mild aneurysmal dilation of the infrarenal segment with a maximal diameter of 3.1 cm. Celiac: Patent without evidence of aneurysm, dissection, vasculitis or significant stenosis. SMA: Fibrofatty atherosclerotic plaque in the proximal SMA results in mild stenosis. No evidence of dissection or aneurysm. Renals: Solitary renal arteries bilaterally. On the right, focal fibrofatty atherosclerotic plaque results in an approximately 50% stenosis of the proximal renal  artery. The remainder of the artery is patent. No dissection. On the left, more extensive fibrofatty atherosclerotic plaque results in approximately 60% stenosis of the proximal renal artery. No evidence of dissection or aneurysm. IMA: Patent without evidence of aneurysm, dissection, vasculitis or significant stenosis. RIGHT Lower Extremity Inflow: Patent stent beginning at the aortic bifurcation extending throughout the mildly aneurysmal right common iliac artery. The stent crosses the origin of the internal iliac artery and terminates in the distal external iliac artery. The  stent is widely patent without evidence of in stent stenosis. The mildly aneurysmal common iliac artery measures up to 2.2 cm in diameter. The external iliac artery remains widely patent. Outflow: The common femoral artery is widely patent. The profunda femoral artery is widely patent. The native superficial femoral artery is chronically occluded. A femoral to above the knee bypass graft is present. The graft is widely patent without evidence of thrombus. There is some inflammatory stranding surrounding the proximal aspect of the graph. As the graft dives into the abductor musculature, it appears normal. No significant edematous changes within the muscular compartment. However, as the graft exits the muscular compartment, inflammatory changes are again noted surrounding the graft. The inflammatory changes are most significant in the most distal aspect of the graft just proximal to the popliteal anastomosis. A region of probable phlegmon measures up to 2.4 x 1.3 cm and extends medially toward the superficial subcutaneous fat. Runoff: Three-vessel runoff to the ankle. LEFT Lower Extremity Inflow: Mildly aneurysmal common iliac artery measuring up to 1.8 cm. High-grade stenosis at the origin of the internal iliac artery. The rest of the artery remains patent. Fibrofatty atherosclerotic plaque results in a moderate focal stenosis of the proximal  external iliac artery. The remainder the external iliac artery is patent. 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 No significant stenosis. The profunda femoral branches are widely patent. The native superficial femoral artery occludes just beyond the origin and remains occluded throughout the thigh before reconstituting as it exits the abductor canal. The popliteal artery is mildly diseased but remains patent. Runoff: Patent 3 vessel runoff to the ankle. Veins: No obvious venous abnormality within the limitations of this arterial phase study. Review of the MIP images confirms the above findings. NON-VASCULAR Lower chest: Compared to the prior CT scan of the chest from October 12, 2016, and CT of the abdomen and pelvis 11/30/2019, there are is a new rounded nodule in the inferior aspect of the right middle lobe which measures 7 mm (image 5 series 5). There is subtle ground-glass attenuation airspace opacity around the periphery of the nodule. Similarly, there is an irregularly shaped nodule within the anterior aspect of the right lower lobe which measures 2.1 x 1.1 cm (image 10 series 5). This region was present on the more recent prior CT scan of the abdomen and pelvis dated 11/30/2019. A third slightly irregular nodular opacity is also visualized affiliated with the most inferior aspect of the major fissure just above the diaphragm. This is also new and measures 1.7 x 1.1 cm on image 15 of series 5. Centrilobular pulmonary emphysema. There is also likely a component of pan acinar emphysematous changes. Dependent atelectasis also present in the right lower lobe. Hepatobiliary: Heterogeneous appearance of the liver with slight atrophy of the right hepatic lobe and relative hypertrophy of the caudate and left hepatic lobe. There is subtle lobularity of the liver contour and a suggestion of recanalization of a paraumbilical vein. Overall, these findings suggest underlying hepatic  cirrhosis. Additionally, there is trace perihepatic ascites. The gallbladder is surgically absent. No intra or extrahepatic biliary ductal dilatation. No discrete hepatic mass. Pancreas: Unremarkable. No pancreatic ductal dilatation or surrounding inflammatory changes. Spleen: Punctate calcifications in the spleen consistent with old granulomatous disease. The spleen remains normal in size. Adrenals/Urinary Tract: Adreniform thickening of the adrenal glands bilaterally. Bilateral circumscribed water attenuation cystic lesions in the kidneys consistent with simple renal cysts. A lesion arising from the upper pole of  the left kidney demonstrates layering milk of calcium within the cyst. No evidence of enhancement or nodularity. No hydronephrosis. However, there are punctate calcifications throughout the renal pyramids bilaterally suggestive of medullary nephrocalcinosis. Additionally, there are several renal stones. On the right, the largest measures 9 mm in the lower pole collecting system. On the left, the largest measures 7 mm in the interpolar collecting system. The ureters are unremarkable. The bladder wall is mildly thickened with a trabeculated appearance. Stomach/Bowel: No focal bowel wall thickening or evidence of obstruction. Normal appendix. Lymphatic: No suspicious intra-abdominal or retroperitoneal lymphadenopathy. Hyperenhancing and asymmetrically enlarged lymph nodes are present in the right groin in the superficial inguinal nodal station. These are almost certainly reactive. Reproductive: Prostatomegaly. Other: Circumscribed peripherally enhancing fluid collection in the subcutaneous fat of the medial mid thigh measures 2.3 x 1.7 by 5.5 cm. Skin thickening and fairly extensive reticulation of the superficial subcutaneous fat along the right thigh involving the anterior, medial and posterior aspects of the thigh extending to the level of the knee. Musculoskeletal: No acute fracture or aggressive  appearing lytic or blastic osseous lesion. IMPRESSION: VASCULAR 1. Imaging findings are concerning for potential superinfection of the right femoral to above the knee popliteal bypass graft. Phlegmonous changes are present about the proximal aspect of the graft just beyond the anastomosis, and again when the graft exits the adductor musculature in the popliteal fossa. Additionally, there is evidence of cellulitis involving entire right thigh (primarily anterior, medial and posterior) with a focal 2.3 x 1.7 x 5.5 cm fluid collection concerning for abscess in the medial mid thigh. Of note, this abscess is in the superficial adipose tissue and not adjacent to the graft. 2. The bypass graft itself remains widely patent without evidence of deterioration at the anastomoses or evidence of mural thrombus formation. 3. Widely patent right common and external iliac artery stent. 4. Abdominal aortic aneurysm with a maximal transverse diameter of 3.1 cm. Recommend followup by ultrasound in 3 years. This recommendation follows ACR consensus guidelines: White Paper of the ACR Incidental Findings Committee II on Vascular Findings. J Am Coll Radiol 2013; 10:789-794. 5. Irregular fibrofatty atherosclerotic plaque throughout the abdominal aorta with a small penetrating atherosclerotic ulcer along the posterior wall of the visceral aorta between the origins of the celiac and superior mesenteric arteries. 6. Mildly aneurysmal right common iliac artery with a maximal diameter of 2.2 cm. 7. Ectatic left common iliac artery with a maximal diameter of 1.7 cm. 8. High-grade stenosis of the origin of the left internal iliac artery. 9. Moderate stenoses of the bilateral renal arteries. 10. 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 NON-VASCULAR 1. New areas of irregular nodularity in the inferior aspect of the right middle and lower lobes compared to prior CT imaging from 11/30/2019. Given the relatively rapid development, an  infectious/inflammatory process is favored. Pneumonia, or septic emboli (particularly given the areas of infection suspected in the right thigh) are considerations. Malignancy is possible, but considered less likely. Of note, 1 of the 3 nodular areas was present on the prior CT scan and would be more concerning for an underlying neoplastic process. Recommend follow-up CT scan of the chest in 4-6 weeks following an appropriate course of therapy to confirm resolution/stability. 2. Reactive lymphadenopathy in the right superficial inguinal nodal station. 3. Prostatomegaly with probable mild bladder wall trabeculation indicating an element of bladder outlet obstruction. 4. Hepatic cirrhosis with trace ascites. 5. 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 6.  Bilateral simple renal cysts at least 1 of which contains layering milk of calcium. 7. Additional ancillary findings as above. These results were called by telephone at the time of interpretation on 01/25/2020 at 9:49 am to provider Servando Snare, who verbally acknowledged these results. Signed, Criselda Peaches, MD, [facility] Radiology Electronically Signed   By: Jacqulynn Cadet M.D.   On: 01/25/2020 09:49   VAS [place] UPPER EXT VEIN MAPPING (PRE-OP AVF)  Result Date: 01/25/2020 UPPER EXTREMITY VEIN MAPPING  Indications: History of PAD; patient is pre-operative for bypass. Comparison Study: no prior study on file Performing Technologist: Sharion Dove RVS  Examination Guidelines: A complete evaluation includes B-mode imaging, spectral Doppler, color Doppler, and power Doppler as needed of all accessible portions of each vessel. Bilateral testing is considered an integral part of a complete examination. Limited examinations for reoccurring indications may be performed as noted. +-----------------+-------------+----------+---------+ Right Cephalic    Diameter (cm)Depth (cm)Findings  +-----------------+-------------+----------+---------+ Prox upper arm       0.30        0.18             +-----------------+-------------+----------+---------+ Mid upper arm        0.26        0.24             +-----------------+-------------+----------+---------+ Dist upper arm       0.29        0.26             +-----------------+-------------+----------+---------+ Antecubital fossa    0.33        0.23             +-----------------+-------------+----------+---------+ Prox forearm         0.34        0.31             +-----------------+-------------+----------+---------+ Mid forearm          0.29        0.33   branching +-----------------+-------------+----------+---------+ Wrist                0.32        0.25             +-----------------+-------------+----------+---------+ +-----------------+-------------+----------+---------+ Right Basilic    Diameter (cm)Depth (cm)Findings  +-----------------+-------------+----------+---------+ Prox upper arm       0.52        0.29   branching +-----------------+-------------+----------+---------+ Mid upper arm        0.51        0.47   branching +-----------------+-------------+----------+---------+ Dist upper arm       0.22        0.29             +-----------------+-------------+----------+---------+ Antecubital fossa    0.26        0.23   Thrombus  +-----------------+-------------+----------+---------+ Prox forearm         0.21        0.23   branching +-----------------+-------------+----------+---------+ Mid forearm          0.19        0.19             +-----------------+-------------+----------+---------+ Wrist                0.16        0.23             +-----------------+-------------+----------+---------+ +-----------------+-------------+----------+--------+ Left Cephalic    Diameter (cm)Depth (cm)Findings  +-----------------+-------------+----------+--------+ Prox upper arm  0.18        0.30   trhombus +-----------------+-------------+----------+--------+ Mid upper arm        0.31        0.28   thrombus +-----------------+-------------+----------+--------+ Dist upper arm       0.17        0.32            +-----------------+-------------+----------+--------+ Antecubital fossa    0.48        0.67   thrombus +-----------------+-------------+----------+--------+ Prox forearm         0.35        0.39            +-----------------+-------------+----------+--------+ Mid forearm          0.34        0.32            +-----------------+-------------+----------+--------+ Wrist                0.29        0.24            +-----------------+-------------+----------+--------+ +-----------------+-------------+----------+---------+ Left Basilic     Diameter (cm)Depth (cm)Findings  +-----------------+-------------+----------+---------+ Prox upper arm       0.46        0.37             +-----------------+-------------+----------+---------+ Mid upper arm        0.59        0.43             +-----------------+-------------+----------+---------+ Dist upper arm       0.51        0.68   branching +-----------------+-------------+----------+---------+ Antecubital fossa    0.24        0.32             +-----------------+-------------+----------+---------+ Prox forearm         0.24        0.32             +-----------------+-------------+----------+---------+ Mid forearm          0.25        0.21             +-----------------+-------------+----------+---------+ Wrist                0.23        0.30   branching +-----------------+-------------+----------+---------+ *See table(s) above for measurements and observations.  Diagnosing physician: Monica Martinez MD Electronically signed by Monica Martinez MD on 01/25/2020 at 4:15:54 PM.    Final    Scheduled Meds: .  amLODipine  2.5 mg Oral Daily  . aspirin EC  81 mg Oral Daily  . clopidogrel  75 mg Oral Q breakfast  . rosuvastatin  20 mg Oral Daily  . sucralfate  1 g Oral TID WC & HS  . vitamin B-12  1,000 mcg Oral Daily  . Vitamin D (Ergocalciferol)  50,000 Units Oral Once per day on Mon Thu   Continuous Infusions: . 0.9 % NaCl with KCl 20 mEq / L 100 mL/hr at 01/26/20 1317  . ceFEPime (MAXIPIME) IV 2 g (01/26/20 0107)  . vancomycin 750 mg (01/26/20 0357)    LOS: 3 days   Time spent: [place]or, MD [facility]  To contact the attending provider between 7A-7P or the covering provider during after hours 7P-7A, please log into the web site [email or web address] and access using universal [place] password for that web site. If you do not have the password, please call  the hospital operator.  01/26/2020, 2:01 PM

## 2020-01-26 NOTE — Progress Notes (Signed)
Report given to short stay. Krisann Mckenna, Bettina Gavia RN

## 2020-01-26 NOTE — Progress Notes (Signed)
Orthopedic Tech Progress Note Patient Details:  Raymond Walker [birth year] [id number]  Ortho Devices Type of Ortho Device: Darco shoe Ortho Device/Splint Location: RLE Ortho Device/Splint Interventions: Ordered, Application   Post Interventions Patient Tolerated: Fair Instructions Provided: Care of device, Adjustment of device   [name] 01/26/2020, 1:13 PM

## 2020-01-26 NOTE — Anesthesia Procedure Notes (Signed)
Procedure Name: Intubation Date/Time: 01/26/2020 9:31 AM Performed by: Griffin Dakin, CRNA Pre-anesthesia Checklist: Patient identified, Emergency Drugs available, Suction available and Patient being monitored Patient Re-evaluated:Patient Re-evaluated prior to induction Oxygen Delivery Method: Circle system utilized Preoxygenation: Pre-oxygenation with 100% oxygen Induction Type: IV induction Ventilation: Mask ventilation without difficulty Laryngoscope Size: Mac and 4 Grade View: Grade I Tube type: Oral Tube size: 7.5 mm Number of attempts: 1 Airway Equipment and Method: Stylet Placement Confirmation: ETT inserted through vocal cords under direct vision,  positive ETCO2 and breath sounds checked- equal and bilateral Secured at: 23 cm Tube secured with: Tape Dental Injury: Teeth and Oropharynx as per pre-operative assessment

## 2020-01-26 NOTE — Consult Note (Signed)
Date of Admission:  01/23/2020          Reason for Consult: Vein harvest site infection and concern for graft infection    Referring Provider: Dr. Donzetta Matters   Assessment:  1. Vein harvest site infection with concern for 2. Graft infection 3. Osteomyelitis status post transmetatarsal amputation 4. Prior history of methicillin sensitive staph aureus bacteremia 5. Lung nodules that have increased in size concerning for either malignancy or potential septic emboli  Plan:  1. Continue vancomycin and cefepime for now 2. Follow-up intraoperative cultures 3  Check repeat 2D echocardiogram 4. Consider a course of 4 to 6 weeks of parenteral antibiotics given osteomyelitis and concern for endovascular infection,  followed by oral suppressive antibiotics with for example Doxycycline twice a daywith adjunctive rifampin  5. Consider social work and palliative care consult given complexity of patient's comorbidities and caregiver being overwhelmed  6. He will need followup CT scan in next few months to re-evaluate lung nodules  Active Problems:   COPD (chronic obstructive pulmonary disease) (HCC)   Protein-calorie malnutrition, severe   Cellulitis of right lower extremity   PAD (peripheral artery disease) (HCC)   Sepsis (HCC)   Altered mental status   Hypokalemia   History of kidney stones   Hematuria   UTI (urinary tract infection)   CHRONIC Bladder outlet obstruction   Vitamin D deficiency   Scheduled Meds: . amLODipine  2.5 mg Oral Daily  . aspirin EC  81 mg Oral Daily  . clopidogrel  75 mg Oral Q breakfast  . rosuvastatin  20 mg Oral Daily  . sucralfate  1 g Oral TID WC & HS  . vitamin B-12  1,000 mcg Oral Daily  . Vitamin D (Ergocalciferol)  50,000 Units Oral Once per day on Mon Thu   Continuous Infusions: . 0.9 % NaCl with KCl 20 mEq / L 100 mL/hr at 01/26/20 1317  . ceFEPime (MAXIPIME) IV 2 g (01/26/20 0107)  . vancomycin 750 mg (01/26/20 0357)   PRN  Meds:.acetaminophen, guaiFENesin, LORazepam, morphine injection, ondansetron **OR** ondansetron (ZOFRAN) IV, oxyCODONE-acetaminophen  HPI: Raymond Walker. is a 69 y.o. male former smoker with peripheral vascular disease who developed gangrene of his toes and was admitted to the hospital in September with methicillin sensitive Staph aureus bacteremia.  He underwent amputation and then also a right common femoral to above-knee popliteal artery bypass PTFE graft.  He had a 2D echocardiogram that was negative for endocarditis but did not undergo transesophageal echocardiogram.  He was prescribed a 4-week course of cefazolin but does not appear to been seen in follow-up by [place] in infectious disease since then.  Apparently in the past he had been cared for in part by his sister who passed away this fall.  His niece who moved here to take care of her sister before she passed away has been largely taking care of him trying to make sure she talks to them by phone on a daily basis and visiting him several times a week.  She had last seen him on Monday but then apparently he was found unresponsive on his couch by a neighbor and brought in to the hospital.  He was bleeding from his right toe amputation site and had elevated lactic acid.  He had blood cultures taken and was started on broad-spectrum antibiotics.  In the interim he was complaining of right thigh pain.  He underwent a CT angiogram which showed phlegmonous changes around the proximal aspect  of the graft beyond the anastomosis.  There is also 2.3 x 1.7 x 5.5 cm abscess in the medial thigh.  Imaging of the chest had shown also multiple nodules concerning for possible malignancy versus septic embolism.  He was taken to the operating room today and underwent portion of his right above-the-knee popliteal graft anastomosis.  Graft itself did not appear overtly infected.  There was an area at the saphenous vein graft site that was opened and found to be  grossly purulent.  This was sent for culture.  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  We were consulted by vascular surgery due to concerns that his graft itself might be infected and that he might need long-term suppressive antibiotics.  His niece who is at the bedside is clearly overwhelmed by everything is going on including losing her mother and now trying to care for her uncle who has multiple comorbidities and likely has some form of dementia.    Review of Systems: Review of Systems  Unable to perform ROS: Mental acuity    Past Medical History:  Diagnosis Date  . COPD (chronic obstructive pulmonary disease) ([place])   . Erectile dysfunction   . History of kidney stones   . Hypertension   . Impaired glucose tolerance   . Pancreatitis, recurrent   . Thrombocytopenia ([place]) 08/10/2018   Staying in the low 100s will follow closely    Social History   Tobacco Use  . Smoking status: Current Every Day Smoker    Packs/day: 1.50    Types: Cigarettes  . Smokeless tobacco: Never Used  . Tobacco comment: stopped on admission to hospital on 08/19/19  Substance Use Topics  . Alcohol use: No  . Drug use: No    Family History  Problem Relation Age of Onset  . Hypertension Father   . Coronary artery disease Father   . Coronary artery disease Mother   . Cancer Mother        Breast   No Known Allergies  OBJECTIVE: Blood pressure (!) 111/58, pulse 71, temperature 97.6 F (36.4 C), temperature source Oral, resp. rate 18, height 5\' 10"  (1.778 m), weight 65.8 kg, SpO2 97 %.  Physical Exam  Lab Results Lab Results  Component Value Date   WBC 4.5 01/26/2020   HGB 10.9 (L) 01/26/2020   HCT 33.5 (L) 01/26/2020   MCV 86.3 01/26/2020   PLT 102 (L) 01/26/2020    Lab Results  Component Value Date   CREATININE 0.71 01/26/2020   BUN 9 01/26/2020   NA 140 01/26/2020   K 3.5 01/26/2020   CL 107 01/26/2020   CO2 25 01/26/2020    Lab Results  Component  Value Date   ALT 14 01/26/2020   AST 16 01/26/2020   ALKPHOS 60 01/26/2020   BILITOT 0.6 01/26/2020     Microbiology: Recent Results (from the past 240 hour(s))  Blood Culture (routine x 2)     Status: None (Preliminary result)   Collection Time: 01/23/20  1:19 AM   Specimen: Right Antecubital; Blood  Result Value Ref Range Status   Specimen Description RIGHT ANTECUBITAL  Final   Special Requests   Final    BOTTLES DRAWN AEROBIC AND ANAEROBIC Blood Culture adequate volume   Culture   Final    NO GROWTH 3 DAYS Performed at [facility], [address] [place], [place] [address]    Report Status PENDING  Incomplete  Blood Culture (routine x 2)  Status: None (Preliminary result)   Collection Time: 01/23/20  1:23 AM   Specimen: Left Antecubital; Blood  Result Value Ref Range Status   Specimen Description LEFT ANTECUBITAL  Final   Special Requests   Final    BOTTLES DRAWN AEROBIC AND ANAEROBIC Blood Culture adequate volume   Culture   Final    NO GROWTH 3 DAYS Performed at [facility], [address], [place], [place] [address]    Report Status PENDING  Incomplete  Respiratory Panel by RT PCR (Flu A&B, Covid) - Nasopharyngeal Swab     Status: None   Collection Time: 01/23/20  2:00 AM   Specimen: Nasopharyngeal Swab  Result Value Ref Range Status   SARS Coronavirus 2 by RT PCR NEGATIVE NEGATIVE Final    Comment: (NOTE) SARS-CoV-2 target nucleic acids are NOT DETECTED. The SARS-CoV-2 RNA is generally detectable in upper respiratoy specimens during the acute phase of infection. The lowest concentration of SARS-CoV-2 viral copies this assay can detect is 131 copies/mL. A negative result does not preclude SARS-Cov-2 infection and should not be used as the sole basis for treatment or other patient management decisions. A negative result may occur with  improper specimen collection/handling, submission of specimen other than nasopharyngeal swab, presence of viral mutation(s)  within the areas targeted by this assay, and inadequate number of viral copies (<131 copies/mL). A negative result must be combined with clinical observations, patient history, and epidemiological information. The expected result is Negative. Fact Sheet for Patients:  PinkCheek.be Fact Sheet for Healthcare Providers:  GravelBags.it This test is not yet ap proved or cleared by the [place] FDA and  has been authorized for detection and/or diagnosis of SARS-CoV-2 by FDA under an Emergency Use Authorization (EUA). This EUA will remain  in effect (meaning this test can be used) for the duration of the COVID-19 declaration under Section 564(b)(1) of the Act, 21 U.S.C. section 360bbb-3(b)(1), unless the authorization is terminated or revoked sooner.    Influenza A by PCR NEGATIVE NEGATIVE Final   Influenza B by PCR NEGATIVE NEGATIVE Final    Comment: (NOTE) The Xpert Xpress SARS-CoV-2/FLU/RSV assay is intended as an aid in  the diagnosis of influenza from Nasopharyngeal swab specimens and  should not be used as a sole basis for treatment. Nasal washings and  aspirates are unacceptable for Xpert Xpress SARS-CoV-2/FLU/RSV  testing. Fact Sheet for Patients: PinkCheek.be Fact Sheet for Healthcare Providers: GravelBags.it This test is not yet approved or cleared by the [place] FDA and  has been authorized for detection and/or diagnosis of SARS-CoV-2 by  FDA under an Emergency Use Authorization (EUA). This EUA will remain  in effect (meaning this test can be used) for the duration of the  Covid-19 declaration under Section 564(b)(1) of the Act, 21  U.S.C. section 360bbb-3(b)(1), unless the authorization is  terminated or revoked. Performed at [facility], [address] [place], [place] [address]   Urine culture     Status: None   Collection Time: 01/23/20  2:30 AM     Specimen: In/Out Cath Urine  Result Value Ref Range Status   Specimen Description   Final    IN/OUT CATH URINE Performed at [facility], [address], [place], [place] [address]    Special Requests   Final    NONE Performed at [facility], [address], [place], [place] [address]    Culture   Final    NO GROWTH Performed at [facility], [place]  [address], [place], [place] [address]    Report Status 01/24/2020 FINAL  Final  MRSA PCR Screening     Status: None   Collection Time: 01/23/20  7:44 AM   Specimen: Nasal Mucosa; Nasopharyngeal  Result Value Ref Range Status   MRSA by PCR NEGATIVE NEGATIVE Final    Comment:        The GeneXpert MRSA Assay (FDA approved for NASAL specimens only), is one component of a comprehensive MRSA colonization surveillance program. It is not intended to diagnose MRSA infection nor to guide or monitor treatment for MRSA infections. Performed at [facility], [address], [place], [place] [address]   Surgical pcr screen     Status: None   Collection Time: 01/25/20  8:59 PM   Specimen: Nasal Mucosa; Nasal Swab  Result Value Ref Range Status   MRSA, PCR NEGATIVE NEGATIVE Final   Staphylococcus aureus NEGATIVE NEGATIVE Final    Comment: (NOTE) The Xpert SA Assay (FDA approved for NASAL specimens in patients 58 years of age and older), is one component of a comprehensive surveillance program. It is not intended to diagnose infection nor to guide or monitor treatment. Performed at [facility], [place] [address], [place],  [address]     Alcide Evener, [place] [facility] [phone] pager  01/26/2020, 2:05 PM

## 2020-01-26 NOTE — Progress Notes (Signed)
Patient Niece, who is patient [facility] POA and legal guardian updated that patient has gone for surgery this AM. Malone Vanblarcom, Bettina Gavia RN

## 2020-01-26 NOTE — Progress Notes (Signed)
Patient arrived from PACU to [place]. Vital signs obtained and patient placed on monitor. Patient with dressing clean dry intact to right thigh and ace to right foot clean dry and intact. Will continue to monitor patient. Archer Moist, Bettina Gavia RN

## 2020-01-26 NOTE — Anesthesia Postprocedure Evaluation (Signed)
Anesthesia Post Note  Patient: Raymond Walker.  Procedure(s) Performed: IRRIGATION AND DEBRIDEMENT RIGHT FEMORAL POPLITEAL BYPASS SITE (Right ) TRANSMETATARSAL AMPUTATION (Right Toe)     Patient location during evaluation: PACU Anesthesia Type: General Level of consciousness: awake Pain management: pain level controlled Vital Signs Assessment: post-procedure vital signs reviewed and stable Cardiovascular status: stable Postop Assessment: no apparent nausea or vomiting Anesthetic complications: no    Last Vitals:  Vitals:   01/26/20 1327 01/26/20 1330  BP: (!) 111/58   Pulse:  71  Resp:  18  Temp:    SpO2:  97%    Last Pain:  Vitals:   01/26/20 1309  TempSrc:   PainSc: 8                  Jya Hughston

## 2020-01-26 NOTE — Progress Notes (Signed)
Patient with complaints of pain with urination, patient states has been since surgery today.  Dr. Sharlet Salina made aware and orders received. Will monitor patient. Tinzley Dalia, Bettina Gavia RN

## 2020-01-26 NOTE — Progress Notes (Signed)
  Progress Note    01/26/2020 8:45 AM Day of Surgery  Subjective: no overnight issues  Vitals:   01/26/20 0455 01/26/20 0755  BP: (!) 172/80 (!) 151/90  Pulse: 94 82  Resp:  18  Temp: 98.5 F (36.9 C) 98.2 F (36.8 C)  SpO2: 100% 98%    Physical Exam: Awake and alert Non labored respirations Right femoral pulse is palpable Right medial thigh erythema Stable changes right 4th/5th toes  CBC    Component Value Date/Time   WBC 4.5 01/26/2020 0250   RBC 3.88 (L) 01/26/2020 0250   HGB 10.9 (L) 01/26/2020 0250   HGB 14.0 10/29/2019 1421   HCT 33.5 (L) 01/26/2020 0250   HCT 43.0 10/29/2019 1421   PLT 102 (L) 01/26/2020 0250   PLT 155 10/29/2019 1421   MCV 86.3 01/26/2020 0250   MCV 88 10/29/2019 1421   MCH 28.1 01/26/2020 0250   MCHC 32.5 01/26/2020 0250   RDW 14.6 01/26/2020 0250   RDW 14.1 10/29/2019 1421   LYMPHSABS 1.0 01/24/2020 0706   LYMPHSABS 1.9 10/29/2019 1421   MONOABS 0.6 01/24/2020 0706   EOSABS 0.0 01/24/2020 0706   EOSABS 0.1 10/29/2019 1421   BASOSABS 0.0 01/24/2020 0706   BASOSABS 0.0 10/29/2019 1421    BMET    Component Value Date/Time   NA 140 01/26/2020 0250   NA 142 10/29/2019 1421   K 3.5 01/26/2020 0250   CL 107 01/26/2020 0250   CO2 25 01/26/2020 0250   GLUCOSE 101 (H) 01/26/2020 0250   BUN 9 01/26/2020 0250   BUN 14 10/29/2019 1421   CREATININE 0.71 01/26/2020 0250   CREATININE 0.70 08/01/2018 1126   CALCIUM 8.4 (L) 01/26/2020 0250   GFRNONAA >60 01/26/2020 0250   GFRNONAA 97 08/01/2018 1126   GFRAA >60 01/26/2020 0250   GFRAA 112 08/01/2018 1126    INR    Component Value Date/Time   INR 1.2 01/23/2020 0118     Intake/Output Summary (Last 24 hours) at 01/26/2020 0845 Last data filed at 01/26/2020 0752 Gross per 24 hour  Intake 1077.07 ml  Output 3300 ml  Net -2222.93 ml     Assessment/plan:  69 y.o. male is here with concern for right fem-pop bypass graft. Plan to explore graft in OR today and possible replace with  cryovein. Will also drain and culture fluid in thigh and complete tma. Will discuss with niece post procedure.      Narek Kniss C. Donzetta Matters, MD [facility] Office: [phone] Pager: [phone]  01/26/2020 8:45 AM

## 2020-01-26 NOTE — Op Note (Signed)
Patient name: Catherine Sama. MRN: [id number] DOB: 1951-05-05 Sex: male  01/26/2020 Pre-operative Diagnosis: possible right femoral-popliteal graft infection, necrosis of right 4th and 5th toes, fluid collection of right thigh saphenectomy site Post-operative diagnosis:  Same Surgeon:  Eda Paschal. Donzetta Matters, MD Assistant: Arlee Muslim, PA Procedure Performed: 1.  Exploration of right above-knee popliteal graft anastomosis 2.  Incision and drainage right medial thigh abscess 3.  Transmetatarsal amputation right foot  Indications: 69 year old male has previously undergone amputation of his first through third toes on the right as well as right femoral to popliteal artery bypass with graft.  At the time of vein was used but was not suitable and PTFE was then used to connect the suitable graft to the distal anastomosis.  He also had positive blood cultures at the time underwent antibiotic therapy for 1 month prior to surgery.  He now has returned with right leg pain was thought to be septic has been started on antibiotics and CT scan demonstrated fluid collection on the medial thigh with concern for stranding around both anastomoses.  He is indicated for the above operation.  Findings: I first explored the popliteal incision above the knee this had graft that was well incorporated there is no evidence of any infection or fluid collection.  This was closed and excluded.  I then identified a fluid collection on the medial thigh at the saphenectomy site with ultrasound.  This was opened there was significant purulence there and cultures were sent.  I packed this wound open.  I proceeded with transmetatarsal amputation of the right foot and there was adequate bleeding there.   Procedure:  The patient was identified in the holding area and taken to the operating room where is placed supine operative when general anesthesia was induced.  He was sterilely prepped and draped.  Antibiotics were updated.  Timeout  was called.  I began by opening the previous above-knee popliteal incision.  I dissected down through significant scar tissue.  Identified the graft by palpation and Doppler.  This was well incorporated.  I elected no intervention there.  I thoroughly irrigated the wound and obtain hemostasis.  He was closed in layers with Vicryl and Monocryl.  Dermabond was placed.  When the Dermabond had dried I use ultrasound to identify the fluid collection on the medial thigh.  This was opened previous saphenectomy site with 10 blade.  I used cautery to resect the counter significant purulence.  Cultures were sent.  I thoroughly irrigated this.  I excised much of the capsule.  This was then packed with wet-to-dry Kerlix.  I turned my attention towards the foot.  10 blade was used to trace the previous incision down to the level of the bone and around the remaining 2 toes taking out the eschar where the third toe previously was.  I dissected down to the level of metatarsal bones with cautery.  The metatarsal bones were transected with bone cutter.  I also dissected back on the first and second metatarsal bones and these were transected.  Entire specimen was removed en bloc using heavy scissors.  Hemostasis was obtained the wound was thoroughly irrigated.  I reapproximated some subcutaneous tissue with 3-0 Vicryl.  The skin was then closed with staples.  A sterile dressing was placed.  He was awakened anesthesia having tolerated procedure without any complication.  Counts were correct at completion.  EBL: 100 cc    Errick Salts C. Donzetta Matters, MD [facility]  Office: [phone] Pager: [phone]

## 2020-01-27 DIAGNOSIS — Z7189 Other specified counseling: Secondary | ICD-10-CM

## 2020-01-27 DIAGNOSIS — Z515 Encounter for palliative care: Secondary | ICD-10-CM

## 2020-01-27 DIAGNOSIS — Z66 Do not resuscitate: Secondary | ICD-10-CM

## 2020-01-27 DIAGNOSIS — T827XXA Infection and inflammatory reaction due to other cardiac and vascular devices, implants and grafts, initial encounter: Secondary | ICD-10-CM

## 2020-01-27 DIAGNOSIS — A4101 Sepsis due to Methicillin susceptible Staphylococcus aureus: Secondary | ICD-10-CM

## 2020-01-27 DIAGNOSIS — G9341 Metabolic encephalopathy: Secondary | ICD-10-CM

## 2020-01-27 DIAGNOSIS — R319 Hematuria, unspecified: Secondary | ICD-10-CM

## 2020-01-27 DIAGNOSIS — Z9862 Peripheral vascular angioplasty status: Secondary | ICD-10-CM

## 2020-01-27 LAB — CBC
HCT: 31.6 % — ABNORMAL LOW (ref 39.0–52.0)
Hemoglobin: 10.2 g/dL — ABNORMAL LOW (ref 13.0–17.0)
MCH: 28.3 pg (ref 26.0–34.0)
MCHC: 32.3 g/dL (ref 30.0–36.0)
MCV: 87.8 fL (ref 80.0–100.0)
Platelets: 132 10*3/uL — ABNORMAL LOW (ref 150–400)
RBC: 3.6 MIL/uL — ABNORMAL LOW (ref 4.22–5.81)
RDW: 14.1 % (ref 11.5–15.5)
WBC: 8 10*3/uL (ref 4.0–10.5)
nRBC: 0 % (ref 0.0–0.2)

## 2020-01-27 LAB — COMPREHENSIVE METABOLIC PANEL
ALT: 12 U/L (ref 0–44)
AST: 13 U/L — ABNORMAL LOW (ref 15–41)
Albumin: 2.5 g/dL — ABNORMAL LOW (ref 3.5–5.0)
Alkaline Phosphatase: 57 U/L (ref 38–126)
Anion gap: 5 (ref 5–15)
BUN: 14 mg/dL (ref 8–23)
CO2: 26 mmol/L (ref 22–32)
Calcium: 8.3 mg/dL — ABNORMAL LOW (ref 8.9–10.3)
Chloride: 105 mmol/L (ref 98–111)
Creatinine, Ser: 0.86 mg/dL (ref 0.61–1.24)
GFR calc Af Amer: >60 mL/min (ref >60)
GFR calc non Af Amer: >60 mL/min (ref >60)
Glucose, Bld: 166 mg/dL — ABNORMAL HIGH (ref 70–99)
Potassium: 4.5 mmol/L (ref 3.5–5.1)
Sodium: 136 mmol/L (ref 135–145)
Total Bilirubin: 0.4 mg/dL (ref 0.3–1.2)
Total Protein: 5.6 g/dL — ABNORMAL LOW (ref 6.5–8.1)

## 2020-01-27 LAB — VANCOMYCIN, PEAK: Vancomycin Pk: 31 ug/mL (ref 30–40)

## 2020-01-27 NOTE — Progress Notes (Signed)
[facility] for Infectious Disease  Date of Admission:  01/23/2020     Total days of antibiotics 2         ASSESSMENT:  Raymond Walker is postop day #1 status post incision and drainage of right medial thigh abscess, transmetatarsal amputation of right foot and exploration of the right above-knee popliteal graft anastomosis.  He has remained afebrile and hemodynamically stable.  Abscess of right thigh surgical specimen with Gram stain and cultures pending.  Continue broad-spectrum coverage with vancomycin and cefepime.  Will likely require prolonged antibiotic therapy.  Narrow therapy and duration of treatment pending culture results.  Continue wound care per vascular surgery.  PLAN:  1. Continue current dose of vancomycin and cefepime. 2. Wound care per vascular surgery. 3. Monitor renal function for nephrotoxicity while on vancomycin. 4. Monitor cultures for organism identification and narrowing of antibiotics as able.  Active Problems:   COPD (chronic obstructive pulmonary disease) (HCC)   Protein-calorie malnutrition, severe   Cellulitis of right lower extremity   PAD (peripheral artery disease) (HCC)   Sepsis (HCC)   Altered mental status   Hypokalemia   History of kidney stones   Hematuria   UTI (urinary tract infection)   CHRONIC Bladder outlet obstruction   Vitamin D deficiency   . amLODipine  2.5 mg Oral Daily  . aspirin EC  81 mg Oral Daily  . clopidogrel  75 mg Oral Q breakfast  . rosuvastatin  20 mg Oral Daily  . sucralfate  1 g Oral TID WC & HS  . vitamin B-12  1,000 mcg Oral Daily  . Vitamin D (Ergocalciferol)  50,000 Units Oral Once per day on Mon Thu    SUBJECTIVE:  Afebrile overnight with no acute events or concerns.  Feeling okay today wanting to know when he was able to go home.  Adamant that he is able to live independently.  No Known Allergies   Review of Systems: Review of Systems  Constitutional: Negative for chills, fever and weight loss.    Respiratory: Negative for cough, shortness of breath and wheezing.   Cardiovascular: Negative for chest pain and leg swelling.  Gastrointestinal: Negative for abdominal pain, constipation, diarrhea, nausea and vomiting.  Skin: Negative for rash.    OBJECTIVE: Vitals:   01/26/20 2340 01/27/20 0435 01/27/20 0719 01/27/20 1038  BP: (!) 152/73 125/88 132/75 (!) 109/95  Pulse: 68 73 68   Resp: 17 17 19 18   Temp: 97.8 F (36.6 C) 98.2 F (36.8 C) 98 F (36.7 C) 98.5 F (36.9 C)  TempSrc: Oral Oral Oral Oral  SpO2: 99% 98% 99%   Weight:      Height:       Body mass index is 20.81 kg/m.  Physical Exam Constitutional:      General: He is not in acute distress.    Appearance: He is well-developed.  Cardiovascular:     Rate and Rhythm: Normal rate and regular rhythm.     Heart sounds: Normal heart sounds.  Pulmonary:     Effort: Pulmonary effort is normal.     Breath sounds: Normal breath sounds.  Musculoskeletal:     Comments: Surgical dressing of right lower extremity is clean and dry.  Skin:    General: Skin is warm and dry.  Neurological:     Mental Status: He is alert and oriented to person, place, and time.  Psychiatric:        Behavior: Behavior normal.  Thought Content: Thought content normal.        Judgment: Judgment normal.     Lab Results Lab Results  Component Value Date   WBC 8.0 01/27/2020   HGB 10.2 (L) 01/27/2020   HCT 31.6 (L) 01/27/2020   MCV 87.8 01/27/2020   PLT 132 (L) 01/27/2020    Lab Results  Component Value Date   CREATININE 0.86 01/27/2020   BUN 14 01/27/2020   NA 136 01/27/2020   K 4.5 01/27/2020   CL 105 01/27/2020   CO2 26 01/27/2020    Lab Results  Component Value Date   ALT 12 01/27/2020   AST 13 (L) 01/27/2020   ALKPHOS 57 01/27/2020   BILITOT 0.4 01/27/2020     Microbiology: Recent Results (from the past 240 hour(s))  Blood Culture (routine x 2)     Status: None (Preliminary result)   Collection Time: 01/23/20   1:19 AM   Specimen: Right Antecubital; Blood  Result Value Ref Range Status   Specimen Description RIGHT ANTECUBITAL  Final   Special Requests   Final    BOTTLES DRAWN AEROBIC AND ANAEROBIC Blood Culture adequate volume   Culture   Final    NO GROWTH 4 DAYS Performed at [facility], [address], [place], [place] [address]    Report Status PENDING  Incomplete  Blood Culture (routine x 2)     Status: None (Preliminary result)   Collection Time: 01/23/20  1:23 AM   Specimen: Left Antecubital; Blood  Result Value Ref Range Status   Specimen Description LEFT ANTECUBITAL  Final   Special Requests   Final    BOTTLES DRAWN AEROBIC AND ANAEROBIC Blood Culture adequate volume   Culture   Final    NO GROWTH 4 DAYS Performed at [facility], [address], [place], [place] [address]    Report Status PENDING  Incomplete  Respiratory Panel by RT PCR (Flu A&B, Covid) - Nasopharyngeal Swab     Status: None   Collection Time: 01/23/20  2:00 AM   Specimen: Nasopharyngeal Swab  Result Value Ref Range Status   SARS Coronavirus 2 by RT PCR NEGATIVE NEGATIVE Final    Comment: (NOTE) SARS-CoV-2 target nucleic acids are NOT DETECTED. The SARS-CoV-2 RNA is generally detectable in upper respiratoy specimens during the acute phase of infection. The lowest concentration of SARS-CoV-2 viral copies this assay can detect is 131 copies/mL. A negative result does not preclude SARS-Cov-2 infection and should not be used as the sole basis for treatment or other patient management decisions. A negative result may occur with  improper specimen collection/handling, submission of specimen other than nasopharyngeal swab, presence of viral mutation(s) within the areas targeted by this assay, and inadequate number of viral copies (<131 copies/mL). A negative result must be combined with clinical observations, patient history, and epidemiological information. The expected result is Negative. Fact Sheet for  Patients:  PinkCheek.be Fact Sheet for Healthcare Providers:  GravelBags.it This test is not yet ap proved or cleared by the [place] FDA and  has been authorized for detection and/or diagnosis of SARS-CoV-2 by FDA under an Emergency Use Authorization (EUA). This EUA will remain  in effect (meaning this test can be used) for the duration of the COVID-19 declaration under Section 564(b)(1) of the Act, 21 U.S.C. section 360bbb-3(b)(1), unless the authorization is terminated or revoked sooner.    Influenza A by PCR NEGATIVE NEGATIVE Final   Influenza B by PCR NEGATIVE NEGATIVE Final    Comment: (  NOTE) The Xpert Xpress SARS-CoV-2/FLU/RSV assay is intended as an aid in  the diagnosis of influenza from Nasopharyngeal swab specimens and  should not be used as a sole basis for treatment. Nasal washings and  aspirates are unacceptable for Xpert Xpress SARS-CoV-2/FLU/RSV  testing. Fact Sheet for Patients: PinkCheek.be Fact Sheet for Healthcare Providers: GravelBags.it This test is not yet approved or cleared by the [place] FDA and  has been authorized for detection and/or diagnosis of SARS-CoV-2 by  FDA under an Emergency Use Authorization (EUA). This EUA will remain  in effect (meaning this test can be used) for the duration of the  Covid-19 declaration under Section 564(b)(1) of the Act, 21  U.S.C. section 360bbb-3(b)(1), unless the authorization is  terminated or revoked. Performed at [facility], [address] [place], [place] [address]   Urine culture     Status: None   Collection Time: 01/23/20  2:30 AM   Specimen: In/Out Cath Urine  Result Value Ref Range Status   Specimen Description   Final    IN/OUT CATH URINE Performed at [facility], [address] [place], [place] [address]    Special Requests   Final    NONE Performed at [facility],  [address], [place], [place] [address]    Culture   Final    NO GROWTH Performed at [facility], [place] [address] [place], [place] [address]    Report Status 01/24/2020 FINAL  Final  MRSA PCR Screening     Status: None   Collection Time: 01/23/20  7:44 AM   Specimen: Nasal Mucosa; Nasopharyngeal  Result Value Ref Range Status   MRSA by PCR NEGATIVE NEGATIVE Final    Comment:        The GeneXpert MRSA Assay (FDA approved for NASAL specimens only), is one component of a comprehensive MRSA colonization surveillance program. It is not intended to diagnose MRSA infection nor to guide or monitor treatment for MRSA infections. Performed at [facility], [address], [place], [place] [address]   Surgical pcr screen     Status: None   Collection Time: 01/25/20  8:59 PM   Specimen: Nasal Mucosa; Nasal Swab  Result Value Ref Range Status   MRSA, PCR NEGATIVE NEGATIVE Final   Staphylococcus aureus NEGATIVE NEGATIVE Final    Comment: (NOTE) The Xpert SA Assay (FDA approved for NASAL specimens in patients 63 years of age and older), is one component of a comprehensive surveillance program. It is not intended to diagnose infection nor to guide or monitor treatment. Performed at [facility], [place] [address] [place], [place] [address]   Aerobic/Anaerobic Culture (surgical/deep wound)     Status: None (Preliminary result)   Collection Time: 01/26/20 10:12 AM   Specimen: PATH Other; Body Fluid  Result Value Ref Range Status   Specimen Description ABSCESS RIGHT THIGH  Final   Special Requests PATIENT ON FOLLOWING VANCOMYCIN  Final   Gram Stain PENDING  Incomplete   Culture   Final    CULTURE REINCUBATED FOR BETTER GROWTH Performed at [facility], [address], [place], [place] [address]    Report Status PENDING  Incomplete     Terri Piedra, [place] [facility] [phone] Pager  01/27/2020  11:02 AM

## 2020-01-27 NOTE — TOC Initial Note (Signed)
Transition of Care (TOC) - Initial/Assessment Note    Patient Details  Name: Raymond Walker. MRN: [id number] Date of Birth: June 23, 1951  Transition of Care [facility]) CM/SW Contact:    Vinie Sill, [place] Phone Number: 01/27/2020, 1:01 PM  Clinical Narrative:                  CSW visit with the patient and his niece at bedside. CSW introduced self and explained role. CSW discussed PT recommendation of ST [facility]hab at [facility] Patient's niece,[name] states the patient lives home alone. She reports he has been to SNF before. She states she would like for the patient to get stronger before going home. She expressed concerns about the patient going home alone and is agreeable to ST rehab at SNF prior to discharging home. She states her preferred SNF for patient is [facility].  Patient's niece inquired about ALFs and their level of care. CSW answered all questions.  Patient's niece expressed appreciation of CSW assistance and states no further questions at his time.    Thurmond Butts, MSW, [facility] Clinical Social Worker   Expected Discharge Plan: Skilled Nursing Facility Barriers to Discharge: Continued Medical Work up   Patient Goals and CMS Choice     Choice offered to / list presented to : [facility] POA / Guardian  Expected Discharge Plan and Services Expected Discharge Plan: [place]       Living arrangements for the past 2 months: Single Family Home                                      Prior Living Arrangements/Services Living arrangements for the past 2 months: Single Family Home Lives with:: Self Patient language and need for interpreter reviewed:: No        Need for Family Participation in Patient Care: Yes (Comment) Care giver support system in place?: Yes (comment)   Criminal Activity/Legal Involvement Pertinent to Current Situation/Hospitalization: No - Comment as needed  Activities of Daily Living Home Assistive Devices/Equipment: CBG  Meter ADL Screening (condition at time of admission) Patient's cognitive ability adequate to safely complete daily activities?: No Is the patient deaf or have difficulty hearing?: No Does the patient have difficulty seeing, even when wearing glasses/contacts?: No Does the patient have difficulty concentrating, remembering, or making decisions?: Yes Patient able to express need for assistance with ADLs?: Yes Does the patient have difficulty dressing or bathing?: Yes Independently performs ADLs?: No Communication: Independent Dressing (OT): Needs assistance Is this a change from baseline?: Change from baseline, expected to last <3days Grooming: Needs assistance Is this a change from baseline?: Change from baseline, expected to last <3 days Feeding: Independent Bathing: Needs assistance Is this a change from baseline?: Change from baseline, expected to last <3 days Toileting: Needs assistance Is this a change from baseline?: Change from baseline, expected to last <3 days In/Out Bed: Needs assistance Is this a change from baseline?: Change from baseline, expected to last <3 days Walks in Home: Needs assistance Is this a change from baseline?: Change from baseline, expected to last <3 days Does the patient have difficulty walking or climbing stairs?: Yes Weakness of Legs: None Weakness of Arms/Hands: None  Permission Sought/Granted Permission sought to share information with : Family Supports, Customer service manager, Case Optician, dispensing granted to share information with : Yes, Verbal Permission Granted  Share Information with NAME: [employer]  Permission granted to share info w AGENCY: SNFs  Permission granted to share info w Relationship: niece  Permission granted to share info w Contact Information: [phone]  Emotional Assessment Appearance:: Appears stated age Attitude/Demeanor/Rapport: Unable to Assess(patient was sleep) Affect (typically observed): Unable to  Assess Orientation: : Oriented to  Time, Oriented to Place, Oriented to Self, Oriented to Situation Alcohol / Substance Use: Not Applicable Psych Involvement: No (comment)  Admission diagnosis:  Hypokalemia [E87.6] Sepsis ([place]) [A41.9] Altered mental status, unspecified altered mental status type [R41.82] Sepsis, due to unspecified organism, unspecified whether acute organ dysfunction present [facility]) [A41.9] Patient Active Problem List   Diagnosis Date Noted  . Vitamin D deficiency 01/24/2020  . Sepsis ([place]) 01/23/2020  . Altered mental status   . Hypokalemia   . History of kidney stones   . Hematuria   . UTI (urinary tract infection)   . CHRONIC Bladder outlet obstruction   . PAD (peripheral artery disease) ([place]) 09/15/2019  . Protein-calorie malnutrition, severe 08/21/2019  . Cellulitis of right lower extremity   . 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[place]) 08/19/2019  . Impaired glucose tolerance   . COPD (chronic obstructive pulmonary disease) ([place])   . Thrombocytopenia ([place]) 08/10/2018  . Aortic atherosclerosis ([place]) 10/14/2016  . Coronary artery calcification seen on CAT scan 10/14/2016  . Other emphysema ([place]) 10/14/2016  . Elevated blood pressure 12/03/2011  . Chest pain 12/03/2011  . Tobacco abuse 12/03/2011  . Right leg pain 12/03/2011   PCP:  [name], MD Pharmacy:   [facility], [place] [address] [place] [place] [address] Phone: [phone] Fax: [phone]     Social Determinants of Health (SDOH) Interventions    Readmission Risk Interventions No flowsheet data found.
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
## 2020-01-27 NOTE — Progress Notes (Addendum)
o Progress Note    01/27/2020 8:31 AM 1 Day Post-Op  Subjective: The patient denies any significant pain this morning.  He denies chest pain or shortness of breath.  He is voiding spontaneously.  Tolerating diet without nausea or vomiting He has been out of bed and ambulated in his room; heel touch only on right.   Vitals:   01/27/20 0435 01/27/20 0719  BP: 125/88 132/75  Pulse: 73 68  Resp: 17 19  Temp: 98.2 F (36.8 C) 98 F (36.7 C)  SpO2: 98% 99%    Physical Exam: Cardiac:  RRR with occasion al PVC Lungs:  Non-labored; loose cough Incisions:  Incisions well approximated. Extremities:  Packing removed from right thigh gsv harvest incision.  The wound is clean with minimal oozing.  Surrounding soft tissues are indurated without significant surrounding erythema.  Dressing from right foot is removed.  His staple line is intact.  There is mild to moderate edema.  Flaps are warm and well perfused.  Monophasic posterior tibial and dorsalis pedis Doppler signal.  Triphasic peroneal signal.   CBC    Component Value Date/Time   WBC 8.0 01/27/2020 0301   RBC 3.60 (L) 01/27/2020 0301   HGB 10.2 (L) 01/27/2020 0301   HGB 14.0 10/29/2019 1421   HCT 31.6 (L) 01/27/2020 0301   HCT 43.0 10/29/2019 1421   PLT 132 (L) 01/27/2020 0301   PLT 155 10/29/2019 1421   MCV 87.8 01/27/2020 0301   MCV 88 10/29/2019 1421   MCH 28.3 01/27/2020 0301   MCHC 32.3 01/27/2020 0301   RDW 14.1 01/27/2020 0301   RDW 14.1 10/29/2019 1421   LYMPHSABS 1.0 01/24/2020 0706   LYMPHSABS 1.9 10/29/2019 1421   MONOABS 0.6 01/24/2020 0706   EOSABS 0.0 01/24/2020 0706   EOSABS 0.1 10/29/2019 1421   BASOSABS 0.0 01/24/2020 0706   BASOSABS 0.0 10/29/2019 1421    BMET    Component Value Date/Time   NA 136 01/27/2020 0301   NA 142 10/29/2019 1421   K 4.5 01/27/2020 0301   CL 105 01/27/2020 0301   CO2 26 01/27/2020 0301   GLUCOSE 166 (H) 01/27/2020 0301   BUN 14 01/27/2020 0301   BUN 14 10/29/2019  1421   CREATININE 0.86 01/27/2020 0301   CREATININE 0.70 08/01/2018 1126   CALCIUM 8.3 (L) 01/27/2020 0301   GFRNONAA >60 01/27/2020 0301   GFRNONAA 97 08/01/2018 1126   GFRAA >60 01/27/2020 0301   GFRAA 112 08/01/2018 1126     Intake/Output Summary (Last 24 hours) at 01/27/2020 0831 Last data filed at 01/27/2020 0600 Gross per 24 hour  Intake 4036.21 ml  Output 1600 ml  Net 2436.21 ml     HOSPITAL MEDICATIONS Scheduled Meds: . amLODipine  2.5 mg Oral Daily  . aspirin EC  81 mg Oral Daily  . clopidogrel  75 mg Oral Q breakfast  . rosuvastatin  20 mg Oral Daily  . sucralfate  1 g Oral TID WC & HS  . vitamin B-12  1,000 mcg Oral Daily  . Vitamin D (Ergocalciferol)  50,000 Units Oral Once per day on Mon Thu   Continuous Infusions: . 0.9 % NaCl with KCl 20 mEq / L Stopped (01/27/20 0519)  . ceFEPime (MAXIPIME) IV Stopped (01/27/20 0315)  . vancomycin 150 mL/hr at 01/27/20 0600   PRN Meds:.acetaminophen, guaiFENesin, LORazepam, morphine injection, ondansetron **OR** ondansetron (ZOFRAN) IV, oxyCODONE-acetaminophen  Assessment:  69 y.o. male is s/p:  1. Exploration of right above-knee popliteal graft anastomosis  2.  Incision and drainage right medial thigh abscess 3.Transmetatarsal amputation right foot  Defervesced and remains afebrile currently.  Hemoglobin is stable with normal white blood cell count. Surgical specimen culture pending.  There was no evidence of graft infection at time of surgery.   Appreciate infectious disease service consultation and recommendations.  1 Day Post-Op  Plan:-Continue antibiotics, wet-to-dry dressings to right thigh wound, supportive care.  Risa Grill, PA-C [facility] [phone] 01/27/2020  8:31 AM    I have interviewed and examined patient with PA and agree with assessment and plan above.  ID and medicine care of this patient much appreciated.  Also needs to protect his left foot at this time will likely need  angiography for possible endovascular intervention of occluded left SFA given distal tip of toe ulceration but would allow healing of the right lower extremity prior to any further intervention.  Cashton Hosley C. Donzetta Matters, MD [facility] [place] Office: [phone] Pager: [phone]

## 2020-01-27 NOTE — NC FL2 (Signed)
[place] LEVEL OF CARE SCREENING TOOL     IDENTIFICATION  Patient Name: Raymond Walker. Birthdate: 1951-09-27 Sex: male Admission Date (Current Location): 01/23/2020  [facility] and [place] Number:  Herbalist and Address:  The . [facility], [place] [address] [place], [place] [address]      Provider Number: [id number]  Attending Physician Name and Address:  Aline August, MD  Relative Name and Phone Number:  [name] [phone]    Current Level of Care: Hospital Recommended Level of Care: [place] Prior Approval Number:    Date Approved/Denied:   PASRR Number: [id number]  Discharge Plan: SNF    Current Diagnoses: Patient Active Problem List   Diagnosis Date Noted  . 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 Vitamin D deficiency 01/24/2020  . Sepsis ([place]) 01/23/2020  . Altered mental status   . Hypokalemia   . History of kidney stones   . Hematuria   . UTI (urinary tract infection)   . CHRONIC Bladder outlet obstruction   . PAD (peripheral artery disease) ([place]) 09/15/2019  . Protein-calorie malnutrition, severe 08/21/2019  . 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[place])   . Cellulitis of right lower extremity   . 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[place]) 08/19/2019  . Impaired glucose tolerance   . COPD (chronic obstructive pulmonary disease) ([place])   . Thrombocytopenia ([place]) 08/10/2018  . Aortic atherosclerosis ([place]) 10/14/2016  . Coronary artery calcification seen on CAT scan 10/14/2016  . Other emphysema ([place]) 10/14/2016  . Elevated blood pressure 12/03/2011  . Chest pain 12/03/2011  . Tobacco abuse 12/03/2011  . Right leg pain 12/03/2011    Orientation RESPIRATION BLADDER Height & Weight     Self, Time, Situation, Place  Normal Continent Weight: 145 lb 1 oz (65.8 kg) Height:  5\' 10"  (177.8 cm)  BEHAVIORAL SYMPTOMS/MOOD NEUROLOGICAL BOWEL NUTRITION STATUS      Continent Diet(please see discharge  summary)  AMBULATORY STATUS COMMUNICATION OF NEEDS Skin   Limited Assist Verbally Surgical wounds(closed incision thigh, right, closed incision rt foot, would/incision non pressure wound foot right, dark black discoloration noted to amputation area & small black discoloration noted to top of last 2 toes, redness noted around whole area)                       Personal Care Assistance Level of Assistance  Bathing, Dressing Bathing Assistance: Limited assistance   Dressing Assistance: Independent     Functional Limitations Info  Sight, Hearing, Speech Sight Info: Adequate Hearing Info: Adequate Speech Info: Adequate    SPECIAL CARE FACTORS FREQUENCY  PT (By licensed PT), OT (By licensed OT)     PT Frequency: 3x per week OT Frequency: 3x per week            Contractures Contractures Info: Not present    Additional Factors Info  Code Status, Allergies Code Status Info: FULL Allergies Info: NKA           Current Medications (01/27/2020):  This is the current hospital active medication list Current Facility-Administered Medications  Medication Dose Route Frequency Provider Last Rate Last Admin  . acetaminophen (TYLENOL) tablet 650 mg  650 mg Oral Q4H PRN Dagoberto Ligas, PA-C   650 mg at 01/25/20 2042  . amLODipine (NORVASC) tablet 2.5 mg  2.5 mg Oral Daily Dagoberto Ligas, PA-C   2.5 mg at 01/27/20 1007  . aspirin EC tablet 81 mg  81  mg Oral Daily Dagoberto Ligas, PA-C   81 mg at 01/27/20 1007  . ceFEPIme (MAXIPIME) 2 g in sodium chloride 0.9 % 100 mL IVPB  2 g Intravenous Q8H Eveland, Matthew, PA-C 200 mL/hr at 01/27/20 1024 2 g at 01/27/20 1024  . clopidogrel (PLAVIX) tablet 75 mg  75 mg Oral Q breakfast Dagoberto Ligas, PA-C   75 mg at 01/27/20 1006  . guaiFENesin (ROBITUSSIN) 100 MG/5ML solution 100 mg  5 mL Oral Q4H PRN Dagoberto Ligas, PA-C   100 mg at 01/25/20 2110  . LORazepam (ATIVAN) injection 0.5 mg  0.5 mg Intravenous Q4H PRN Dagoberto Ligas, PA-C    0.5 mg at 01/25/20 2110  . morphine 2 MG/ML injection 2-4 mg  2-4 mg Intravenous Q2H PRN Dagoberto Ligas, PA-C      . ondansetron (ZOFRAN) tablet 4 mg  4 mg Oral Q6H PRN Dagoberto Ligas, PA-C       Or  . ondansetron (ZOFRAN) injection 4 mg  4 mg Intravenous Q6H PRN Dagoberto Ligas, PA-C   4 mg at 01/27/20 1318  . oxyCODONE-acetaminophen (PERCOCET/ROXICET) 5-325 MG per tablet 1-2 tablet  1-2 tablet Oral Q4H PRN Dagoberto Ligas, PA-C   2 tablet at 01/27/20 0650  . rosuvastatin (CRESTOR) tablet 20 mg  20 mg Oral Daily Dagoberto Ligas, PA-C   20 mg at 01/27/20 1010  . sucralfate (CARAFATE) tablet 1 g  1 g Oral TID WC & HS Dagoberto Ligas, PA-C   1 g at 01/27/20 1318  . vancomycin (VANCOREADY) IVPB 750 mg/150 mL  750 mg Intravenous Q12H Dagoberto Ligas, PA-C 150 mL/hr at 01/27/20 0600 Rate Verify at 01/27/20 0600  . vitamin B-12 (CYANOCOBALAMIN) tablet 1,000 mcg  1,000 mcg Oral Daily Dagoberto Ligas, PA-C   1,000 mcg at 01/27/20 1009  . Vitamin D (Ergocalciferol) (DRISDOL) capsule 50,000 Units  50,000 Units Oral Once per day on Mon Thu Eveland, Matthew, [place]   50,000 Units at 01/25/20 1025     Discharge Medications: Please see discharge summary for a list of discharge medications.  Relevant Imaging Results:  Relevant Lab Results:   Additional Information SSN [id number]  Vinie Sill, LCSWA

## 2020-01-27 NOTE — TOC Progression Note (Signed)
Transition of Care (TOC) - Progression Note    Patient Details  Name: Raymond Walker. MRN: [id number] Date of Birth: May 17, 1951  Transition of Care [facility]) CM/SW [place], [place] Phone Number: 01/27/2020, 1:12 PM  Clinical Narrative:     Patient's niece has accepted bed offer with [facility].   Thurmond Butts, MSW, [place] Clinical Social Worker   Expected Discharge Plan: Skilled Nursing Facility Barriers to Discharge: Continued Medical Work up  Expected Discharge Plan and Services Expected Discharge Plan: [place] arrangements for the past 2 months: Single Family Home                                       Social Determinants of Health (SDOH) Interventions    Readmission Risk Interventions No flowsheet data found.

## 2020-01-27 NOTE — Progress Notes (Signed)
PT Cancellation Note  Patient Details Name: Raymond Walker. MRN: [id number] DOB: 02-15-1951   Cancelled Treatment:    Reason Eval/Treat Not Completed: Other (comment). Pt reports "I feel weak and the room is spinning. I am not getting up today". Bp taken, 130/96. Pt educated on the importance of OOB mobility however pt continued to refused. RN made aware of patients c/o of "room spinning". Acute PT to return as able to progress mobility.  Kittie Plater, PT, DPT Acute Rehabilitation Services Pager #: [phone] Office #: [phone]    [place] Lopes 01/27/2020, 11:24 AM

## 2020-01-27 NOTE — Progress Notes (Signed)
Patient ID: Raymond Radon., male   DOB: 1951/03/09, 69 y.o.   MRN: [id number]  PROGRESS NOTE    Raymond Radon.  [id number] DOB: 04-Mar-1951 DOA: 01/23/2020 PCP: [name], MD   Brief Narrative:  69 year old male with history of COPD, hypertension, thrombocytopenia, status post right femoral to above-knee popliteal bypass graft and right toes amputation on 09/15/2019 by vascular surgery presented with altered mental status and severe sepsis.  Apparently, neighbor had found him on the couch, not moving, confused, incontinent of urine and feces.  Patient was found to have bleeding from the right toe amputation site, lactic acid was 4.1.  He was empirically started on vancomycin and cefepime for sepsis.  Vascular surgery was consulted.  ID was also subsequently consulted.  He underwent I&D of right medial thigh abscess along with exploration of right above knee popliteal graft anastomosis and transmetatarsal amputation of right foot on 01/26/2020 by vascular surgery  Assessment & Plan:   Sepsis: Present on admission -Probably from below.  Currently on broad-spectrum antibiotics.  Follow OR cultures.  Follow ID recommendations.  Currently hemodynamically stable.  Blood and urine cultures have been negative so far.  Right thigh abscess Right foot osteomyelitis -Status post I&D of right thigh abscess along with transmetatarsal amputation of right foot on 01/26/2020 by vascular surgery.  Wound care as per vascular surgery recommendations.  Pain management as per vascular surgery recommendations.  ID following.  Follow OR  cultures.  Currently on vancomycin and cefepime. -We will need PT/OT eval.  Peripheral vascular disease with history of femoropopliteal bypass -Vascular Surgery following.  No evidence of infection on surgical exploration on 01/26/2020.  Acute metabolic encephalopathy -Most likely from above.  Resolved.  Monitor mental status.  Fall  precautions.   Thrombocytopenia -Improving.  Probably secondary to sepsis.  Heparin on hold.  Severe vitamin D deficiency -Continue supplementation  Hematuria Chronic bladder outlet obstruction from enlarged prostate -Hematuria is chronic.  CT shows that he has renal stones. -In and out cath as needed if unable to void.  Follow-up with urology as an outpatient.  Anemia of chronic disease -Hemoglobin stable.   DVT prophylaxis: SCDs Code Status: Full Family Communication: None at bedside Disposition Plan: Patient currently remains inpatient on IV antibiotics pending OR cultures.  We will also need PT evaluation to determine if needs SNF placement.  Consultants: Vascular surgery/ID  Procedures:  I&D of right medial thigh abscess along with exploration of right above knee popliteal graft anastomosis and transmetatarsal amputation of right foot on 01/26/2020 by vascular surgery  Antimicrobials:  I&D of right medial thigh abscess along with exploration of right above knee popliteal graft anastomosis and transmetatarsal amputation of right foot on 01/26/2020 by vascular surgery  Subjective: Patient seen and examined at bedside.  He is awake but is a poor historian.  No overnight fever, vomiting reported.  Objective: Vitals:   01/26/20 1930 01/26/20 2340 01/27/20 0435 01/27/20 0719  BP: (!) 104/58 (!) 152/73 125/88 132/75  Pulse: 73 68 73 68  Resp: 17 17 17 19   Temp: 98 F (36.7 C) 97.8 F (36.6 C) 98.2 F (36.8 C) 98 F (36.7 C)  TempSrc: Oral Oral Oral Oral  SpO2: 97% 99% 98% 99%  Weight:      Height:        Intake/Output Summary (Last 24 hours) at 01/27/2020 0940 Last data filed at 01/27/2020 0600 Gross per 24 hour  Intake 4036.21 ml  Output 1600 ml  Net 2436.21  ml   Filed Weights   01/23/20 0106 01/23/20 0600  Weight: 65.8 kg 65.8 kg    Examination:  General exam: Appears calm and comfortable.  Looks older than stated age.  Poor historian.  Mildly  confused. Respiratory system: Bilateral decreased breath sounds at bases with scattered crackles Cardiovascular system: S1 & S2 heard, Rate controlled Gastrointestinal system: Abdomen is nondistended, soft and nontender. Normal bowel sounds heard. Extremities: No cyanosis, clubbing, edema.  Right foot dressing in right thigh dressing present.   Data Reviewed: I have personally reviewed following labs and imaging studies  CBC: Recent Labs  Lab 01/23/20 0118 01/23/20 0710 01/24/20 0706 01/26/20 0250 01/27/20 0301  WBC 12.2* 9.8 5.2 4.5 8.0  NEUTROABS 10.8*  --  3.5  --   --   HGB 13.5 11.0* 10.4* 10.9* 10.2*  HCT 41.5 34.4* 33.1* 33.5* 31.6*  MCV 88.1 88.9 89.9 86.3 87.8  PLT 125* 99* 93* 102* [id number]*   Basic Metabolic Panel: Recent Labs  Lab 01/23/20 0118 01/24/20 0706 01/25/20 0320 01/26/20 0250 01/27/20 0301  NA 135 138 143 140 136  K 3.1* 3.8 3.5 3.5 4.5  CL 95* 107 106 107 105  CO2 26 24 25 25 26   GLUCOSE 171* 104* 100* 101* 166*  BUN 16 19 12 9 14   CREATININE 1.03 0.78 0.72 0.71 0.86  CALCIUM 9.0 8.1* 8.3* 8.4* 8.3*  MG  --  1.4*  --   --   --    GFR: Estimated Creatinine Clearance: 75.4 mL/min (by C-G formula based on SCr of 0.86 mg/dL). Liver Function Tests: Recent Labs  Lab 01/23/20 0118 01/24/20 0706 01/25/20 0320 01/26/20 0250 01/27/20 0301  AST 24 18 18 16  13*  ALT 11 12 13 14 12   ALKPHOS 78 50 50 60 57  BILITOT 0.9 0.6 0.6 0.6 0.4  PROT 8.0 5.7* 5.5* 6.1* 5.6*  ALBUMIN 3.6 2.5* 2.3* 2.6* 2.5*   No results for input(s): LIPASE, AMYLASE in the last 168 hours. No results for input(s): AMMONIA in the last 168 hours. Coagulation Profile: Recent Labs  Lab 01/23/20 0118  INR 1.2   Cardiac Enzymes: Recent Labs  Lab 01/23/20 0118  CKTOTAL 502*   BNP (last 3 results) No results for input(s): PROBNP in the last 8760 hours. HbA1C: No results for input(s): HGBA1C in the last 72 hours. CBG: No results for input(s): GLUCAP in the last 168  hours. Lipid Profile: No results for input(s): CHOL, HDL, LDLCALC, TRIG, CHOLHDL, LDLDIRECT in the last 72 hours. Thyroid Function Tests: No results for input(s): TSH, T4TOTAL, FREET4, T3FREE, THYROIDAB in the last 72 hours. Anemia Panel: No results for input(s): VITAMINB12, FOLATE, FERRITIN, TIBC, IRON, RETICCTPCT in the last 72 hours. Sepsis Labs: Recent Labs  Lab 01/23/20 0118 01/23/20 0314 01/23/20 0916  LATICACIDVEN 4.1* 3.0* 1.6    Recent Results (from the past 240 hour(s))  Blood Culture (routine x 2)     Status: None (Preliminary result)   Collection Time: 01/23/20  1:19 AM   Specimen: Right Antecubital; Blood  Result Value Ref Range Status   Specimen Description RIGHT ANTECUBITAL  Final   Special Requests   Final    BOTTLES DRAWN AEROBIC AND ANAEROBIC Blood Culture adequate volume   Culture   Final    NO GROWTH 4 DAYS Performed at [facility], [address] [place], [place] [address]    Report Status PENDING  Incomplete  Blood Culture (routine x 2)     Status: None (Preliminary result)  Collection Time: 01/23/20  1:23 AM   Specimen: Left Antecubital; Blood  Result Value Ref Range Status   Specimen Description LEFT ANTECUBITAL  Final   Special Requests   Final    BOTTLES DRAWN AEROBIC AND ANAEROBIC Blood Culture adequate volume   Culture   Final    NO GROWTH 4 DAYS Performed at [facility], [address] [place], [place] [address]    Report Status PENDING  Incomplete  Respiratory Panel by RT PCR (Flu A&B, Covid) - Nasopharyngeal Swab     Status: None   Collection Time: 01/23/20  2:00 AM   Specimen: Nasopharyngeal Swab  Result Value Ref Range Status   SARS Coronavirus 2 by RT PCR NEGATIVE NEGATIVE Final    Comment: (NOTE) SARS-CoV-2 target nucleic acids are NOT DETECTED. The SARS-CoV-2 RNA is generally detectable in upper respiratoy specimens during the acute phase of infection. The lowest concentration of SARS-CoV-2 viral copies this assay can detect  is 131 copies/mL. A negative result does not preclude SARS-Cov-2 infection and should not be used as the sole basis for treatment or other patient management decisions. A negative result may occur with  improper specimen collection/handling, submission of specimen other than nasopharyngeal swab, presence of viral mutation(s) within the areas targeted by this assay, and inadequate number of viral copies (<131 copies/mL). A negative result must be combined with clinical observations, patient history, and epidemiological information. The expected result is Negative. Fact Sheet for Patients:  PinkCheek.be Fact Sheet for Healthcare Providers:  GravelBags.it This test is not yet ap proved or cleared by the [place] FDA and  has been authorized for detection and/or diagnosis of SARS-CoV-2 by FDA under an Emergency Use Authorization (EUA). This EUA will remain  in effect (meaning this test can be used) for the duration of the COVID-19 declaration under Section 564(b)(1) of the Act, 21 U.S.C. section 360bbb-3(b)(1), unless the authorization is terminated or revoked sooner.    Influenza A by PCR NEGATIVE NEGATIVE Final   Influenza B by PCR NEGATIVE NEGATIVE Final    Comment: (NOTE) The Xpert Xpress SARS-CoV-2/FLU/RSV assay is intended as an aid in  the diagnosis of influenza from Nasopharyngeal swab specimens and  should not be used as a sole basis for treatment. Nasal washings and  aspirates are unacceptable for Xpert Xpress SARS-CoV-2/FLU/RSV  testing. Fact Sheet for Patients: PinkCheek.be Fact Sheet for Healthcare Providers: GravelBags.it This test is not yet approved or cleared by the [place] FDA and  has been authorized for detection and/or diagnosis of SARS-CoV-2 by  FDA under an Emergency Use Authorization (EUA). This EUA will remain  in effect (meaning this  test can be used) for the duration of the  Covid-19 declaration under Section 564(b)(1) of the Act, 21  U.S.C. section 360bbb-3(b)(1), unless the authorization is  terminated or revoked. Performed at [facility], [address], [place], [place] [address]   Urine culture     Status: None   Collection Time: 01/23/20  2:30 AM   Specimen: In/Out Cath Urine  Result Value Ref Range Status   Specimen Description   Final    IN/OUT CATH URINE Performed at [facility], [address], [place], [place] [address]    Special Requests   Final    NONE Performed at [facility], [address], [place], [place] [address]    Culture   Final    NO GROWTH Performed at [facility], [place] [address], [place], [place] [address]  Report Status 01/24/2020 FINAL  Final  MRSA PCR Screening     Status: None   Collection Time: 01/23/20  7:44 AM   Specimen: Nasal Mucosa; Nasopharyngeal  Result Value Ref Range Status   MRSA by PCR NEGATIVE NEGATIVE Final    Comment:        The GeneXpert MRSA Assay (FDA approved for NASAL specimens only), is one component of a comprehensive MRSA colonization surveillance program. It is not intended to diagnose MRSA infection nor to guide or monitor treatment for MRSA infections. Performed at [facility], [address], [place], [place] [address]   Surgical pcr screen     Status: None   Collection Time: 01/25/20  8:59 PM   Specimen: Nasal Mucosa; Nasal Swab  Result Value Ref Range Status   MRSA, PCR NEGATIVE NEGATIVE Final   Staphylococcus aureus NEGATIVE NEGATIVE Final    Comment: (NOTE) The Xpert SA Assay (FDA approved for NASAL specimens in patients 75 years of age and older), is one component of a comprehensive surveillance program. It is not intended to diagnose infection nor to guide or monitor treatment. Performed at [facility], [place] [address], [place], [place] [address]          Radiology Studies: VAS [place] UPPER EXT VEIN MAPPING  (PRE-OP AVF)  Result Date: 01/25/2020 UPPER EXTREMITY VEIN MAPPING  Indications: History of PAD; patient is pre-operative for bypass. Comparison Study: no prior study on file Performing Technologist: Sharion Dove RVS  Examination Guidelines: A complete evaluation includes B-mode imaging, spectral Doppler, color Doppler, and power Doppler as needed of all accessible portions of each vessel. Bilateral testing is considered an integral part of a complete examination. Limited examinations for reoccurring indications may be performed as noted. +-----------------+-------------+----------+---------+ Right Cephalic   Diameter (cm)Depth (cm)Findings  +-----------------+-------------+----------+---------+ Prox upper arm       0.30        0.18             +-----------------+-------------+----------+---------+ Mid upper arm        0.26        0.24             +-----------------+-------------+----------+---------+ Dist upper arm       0.29        0.26             +-----------------+-------------+----------+---------+ Antecubital fossa    0.33        0.23             +-----------------+-------------+----------+---------+ Prox forearm         0.34        0.31             +-----------------+-------------+----------+---------+ Mid forearm          0.29        0.33   branching +-----------------+-------------+----------+---------+ Wrist                0.32        0.25             +-----------------+-------------+----------+---------+ +-----------------+-------------+----------+---------+ Right Basilic    Diameter (cm)Depth (cm)Findings  +-----------------+-------------+----------+---------+ Prox upper arm       0.52        0.29   branching +-----------------+-------------+----------+---------+ Mid upper arm        0.51        0.47   branching +-----------------+-------------+----------+---------+ Dist upper arm       0.22        0.29              +-----------------+-------------+----------+---------+  Antecubital fossa    0.26        0.23   Thrombus  +-----------------+-------------+----------+---------+ Prox forearm         0.21        0.23   branching +-----------------+-------------+----------+---------+ Mid forearm          0.19        0.19             +-----------------+-------------+----------+---------+ Wrist                0.16        0.23             +-----------------+-------------+----------+---------+ +-----------------+-------------+----------+--------+ Left Cephalic    Diameter (cm)Depth (cm)Findings +-----------------+-------------+----------+--------+ Prox upper arm       0.18        0.30   trhombus +-----------------+-------------+----------+--------+ Mid upper arm        0.31        0.28   thrombus +-----------------+-------------+----------+--------+ Dist upper arm       0.17        0.32            +-----------------+-------------+----------+--------+ Antecubital fossa    0.48        0.67   thrombus +-----------------+-------------+----------+--------+ Prox forearm         0.35        0.39            +-----------------+-------------+----------+--------+ Mid forearm          0.34        0.32            +-----------------+-------------+----------+--------+ Wrist                0.29        0.24            +-----------------+-------------+----------+--------+ +-----------------+-------------+----------+---------+ Left Basilic     Diameter (cm)Depth (cm)Findings  +-----------------+-------------+----------+---------+ Prox upper arm       0.46        0.37             +-----------------+-------------+----------+---------+ Mid upper arm        0.59        0.43             +-----------------+-------------+----------+---------+ Dist upper arm       0.51        0.68   branching +-----------------+-------------+----------+---------+ Antecubital fossa    0.24        0.32              +-----------------+-------------+----------+---------+ Prox forearm         0.24        0.32             +-----------------+-------------+----------+---------+ Mid forearm          0.25        0.21             +-----------------+-------------+----------+---------+ Wrist                0.23        0.30   branching +-----------------+-------------+----------+---------+ *See table(s) above for measurements and observations.  Diagnosing physician: Monica Martinez MD Electronically signed by Monica Martinez MD on 01/25/2020 at 4:15:54 PM.    Final         Scheduled Meds: . amLODipine  2.5 mg Oral Daily  . aspirin EC  81 mg Oral Daily  . clopidogrel  75 mg Oral Q breakfast  .  rosuvastatin  20 mg Oral Daily  . sucralfate  1 g Oral TID WC & HS  . vitamin B-12  1,000 mcg Oral Daily  . Vitamin D (Ergocalciferol)  50,000 Units Oral Once per day on Mon Thu   Continuous Infusions: . 0.9 % NaCl with KCl 20 mEq / L Stopped (01/27/20 0519)  . ceFEPime (MAXIPIME) IV Stopped (01/27/20 0315)  . vancomycin 150 mL/hr at 01/27/20 0600          Aline August, MD [facility] 01/27/2020, 9:40 AM

## 2020-01-28 ENCOUNTER — Inpatient Hospital Stay: Payer: Self-pay

## 2020-01-28 LAB — CULTURE, BLOOD (ROUTINE X 2)
Culture: NO GROWTH
Culture: NO GROWTH
Special Requests: ADEQUATE
Special Requests: ADEQUATE

## 2020-01-28 LAB — CBC WITH DIFFERENTIAL/PLATELET
Abs Immature Granulocytes: 0.05 10*3/uL (ref 0.00–0.07)
Basophils Absolute: 0 10*3/uL (ref 0.0–0.1)
Basophils Relative: 0 %
Eosinophils Absolute: 0.1 10*3/uL (ref 0.0–0.5)
Eosinophils Relative: 2 %
HCT: 27.7 % — ABNORMAL LOW (ref 39.0–52.0)
Hemoglobin: 9 g/dL — ABNORMAL LOW (ref 13.0–17.0)
Immature Granulocytes: 1 %
Lymphocytes Relative: 29 %
Lymphs Abs: 1.9 10*3/uL (ref 0.7–4.0)
MCH: 28.3 pg (ref 26.0–34.0)
MCHC: 32.5 g/dL (ref 30.0–36.0)
MCV: 87.1 fL (ref 80.0–100.0)
Monocytes Absolute: 0.7 10*3/uL (ref 0.1–1.0)
Monocytes Relative: 10 %
Neutro Abs: 4 10*3/uL (ref 1.7–7.7)
Neutrophils Relative %: 58 %
Platelets: 125 10*3/uL — ABNORMAL LOW (ref 150–400)
RBC: 3.18 MIL/uL — ABNORMAL LOW (ref 4.22–5.81)
RDW: 14.5 % (ref 11.5–15.5)
WBC: 6.8 10*3/uL (ref 4.0–10.5)
nRBC: 0 % (ref 0.0–0.2)

## 2020-01-28 LAB — COMPREHENSIVE METABOLIC PANEL
ALT: 11 U/L (ref 0–44)
AST: 10 U/L — ABNORMAL LOW (ref 15–41)
Albumin: 2.3 g/dL — ABNORMAL LOW (ref 3.5–5.0)
Alkaline Phosphatase: 46 U/L (ref 38–126)
Anion gap: 8 (ref 5–15)
BUN: 13 mg/dL (ref 8–23)
CO2: 29 mmol/L (ref 22–32)
Calcium: 8.6 mg/dL — ABNORMAL LOW (ref 8.9–10.3)
Chloride: 103 mmol/L (ref 98–111)
Creatinine, Ser: 0.63 mg/dL (ref 0.61–1.24)
GFR calc Af Amer: >60 mL/min (ref >60)
GFR calc non Af Amer: >60 mL/min (ref >60)
Glucose, Bld: 116 mg/dL — ABNORMAL HIGH (ref 70–99)
Potassium: 3.8 mmol/L (ref 3.5–5.1)
Sodium: 140 mmol/L (ref 135–145)
Total Bilirubin: 0.3 mg/dL (ref 0.3–1.2)
Total Protein: 5.3 g/dL — ABNORMAL LOW (ref 6.5–8.1)

## 2020-01-28 LAB — VANCOMYCIN, TROUGH: Vancomycin Tr: 11 ug/mL — ABNORMAL LOW (ref 15–20)

## 2020-01-28 LAB — MAGNESIUM: Magnesium: 1.5 mg/dL — ABNORMAL LOW (ref 1.7–2.4)

## 2020-01-28 MED ORDER — BISACODYL 10 MG RE SUPP: 10.0000 mg | Freq: Every day | RECTAL | Status: DC | PRN

## 2020-01-28 MED ORDER — SENNOSIDES-DOCUSATE SODIUM 8.6-50 MG PO TABS
1.0000 | ORAL_TABLET | Freq: Two times a day (BID) | ORAL | Status: DC
  Administered 2020-01-29 (×2): 1 via ORAL
  Filled 2020-01-28 (×3): qty 1

## 2020-01-28 MED ORDER — POLYETHYLENE GLYCOL 3350 17 G PO PACK: 17.0000 g | PACK | Freq: Every day | ORAL | Status: DC | PRN

## 2020-01-28 MED ORDER — CEFAZOLIN SODIUM-DEXTROSE 2-4 GM/100ML-% IV SOLN
2.0000 g | Freq: Three times a day (TID) | INTRAVENOUS | Status: DC
  Administered 2020-01-28 – 2020-01-30 (×6): 2 g via INTRAVENOUS
  Filled 2020-01-28 (×8): qty 100

## 2020-01-28 MED ORDER — CEFAZOLIN SODIUM-DEXTROSE 1-4 GM/50ML-% IV SOLN
1.0000 g | Freq: Three times a day (TID) | INTRAVENOUS | Status: DC
  Filled 2020-01-28: qty 50

## 2020-01-28 NOTE — Progress Notes (Signed)
Patient ID: Raymond Radon., male   DOB: June 18, 1951, 69 y.o.   MRN: [id number]  PROGRESS NOTE    Raymond Radon.  [id number] DOB: Oct 09, 1951 DOA: 01/23/2020 PCP: [name], MD   Brief Narrative:  69 year old male with history of COPD, hypertension, thrombocytopenia, status post right femoral to above-knee popliteal bypass graft and right toes amputation on 09/15/2019 by vascular surgery presented with altered mental status and severe sepsis.  Apparently, neighbor had found him on the couch, not moving, confused, incontinent of urine and feces.  Patient was found to have bleeding from the right toe amputation site, lactic acid was 4.1.  He was empirically started on vancomycin and cefepime for sepsis.  Vascular surgery was consulted.  ID was also subsequently consulted.  He underwent I&D of right medial thigh abscess along with exploration of right above knee popliteal graft anastomosis and transmetatarsal amputation of right foot on 01/26/2020 by vascular surgery  Assessment & Plan:   Sepsis: Present on admission -Probably from below.  Currently on broad-spectrum antibiotics.  Follow OR cultures.  Follow ID recommendations.  Currently hemodynamically stable.  Blood and urine cultures have been negative so far.  Right thigh abscess Right foot osteomyelitis -Status post I&D of right thigh abscess along with transmetatarsal amputation of right foot on 01/26/2020 by vascular surgery.  Wound care as per vascular surgery recommendations.  Pain management as per vascular surgery recommendations.  ID following.  OR  Culture is growing rare Streptococcus group G.  Currently on vancomycin and cefepime.  Peripheral vascular disease with history of femoropopliteal bypass -Vascular Surgery following.  No evidence of infection on surgical exploration on 01/26/2020.  Acute metabolic encephalopathy -Most likely from above.  Resolved.  Monitor mental status.  Fall  precautions.  Hypomagnesemia -Replace.  Thrombocytopenia -Improving.  Probably secondary to sepsis.  Heparin on hold.  Severe vitamin D deficiency -Continue supplementation  Hematuria Chronic bladder outlet obstruction from enlarged prostate -Hematuria is chronic.  CT shows that he has renal stones. -In and out cath as needed if unable to void.  Follow-up with urology as an outpatient.  Anemia of chronic disease -Hemoglobin stable.   DVT prophylaxis: SCDs Code Status: DNR Family Communication: None at bedside Disposition Plan: Patient currently remains inpatient on IV antibiotics . Will possibly need SNF placement once cleared by vascular surgery and ID.  Consultants: Vascular surgery/ID  Procedures:  I&D of right medial thigh abscess along with exploration of right above knee popliteal graft anastomosis and transmetatarsal amputation of right foot on 01/26/2020 by vascular surgery  Antimicrobials:  Anti-infectives (From admission, onward)   Start     Dose/Rate Route Frequency Ordered Stop   01/26/20 1000  ceFEPIme (MAXIPIME) 2 g in sodium chloride 0.9 % 100 mL IVPB  Status:  Discontinued     2 g 200 mL/hr over 30 Minutes Intravenous To Surgery 01/26/20 0958 01/26/20 1159   01/24/20 1600  vancomycin (VANCOREADY) IVPB 750 mg/150 mL     750 mg 150 mL/hr over 60 Minutes Intravenous Every 12 hours 01/24/20 1056     01/23/20 1600  vancomycin (VANCOREADY) IVPB 750 mg/150 mL  Status:  Discontinued     750 mg 150 mL/hr over 60 Minutes Intravenous Every 12 hours 01/23/20 0256 01/24/20 0800   01/23/20 1000  ceFEPIme (MAXIPIME) 2 g in sodium chloride 0.9 % 100 mL IVPB     2 g 200 mL/hr over 30 Minutes Intravenous Every 8 hours 01/23/20 0256     01/23/20  0115  ceFEPIme (MAXIPIME) 2 g in sodium chloride 0.9 % 100 mL IVPB     2 g 200 mL/hr over 30 Minutes Intravenous  Once 01/23/20 0111 01/23/20 0240   01/23/20 0115  metroNIDAZOLE (FLAGYL) IVPB 500 mg     500 mg 100 mL/hr over 60  Minutes Intravenous  Once 01/23/20 0111 01/23/20 0301   01/23/20 0115  vancomycin (VANCOCIN) IVPB 1000 mg/200 mL premix     1,000 mg 200 mL/hr over 60 Minutes Intravenous  Once 01/23/20 0111 01/23/20 0342      Subjective: Patient seen and examined at bedside.  Poor historian.  Slightly confused.  No overnight fever, worsening pain or vomiting reported. Objective: Vitals:   01/27/20 0719 01/27/20 1038 01/27/20 2110 01/28/20 0410  BP: 132/75 (!) 109/95 (!) 96/53 (!) 152/72  Pulse: 68  67 60  Resp: 19 18 16 18   Temp: 98 F (36.7 C) 98.5 F (36.9 C) 98.3 F (36.8 C) 97.6 F (36.4 C)  TempSrc: Oral Oral Oral Oral  SpO2: 99%  96% 97%  Weight:      Height:        Intake/Output Summary (Last 24 hours) at 01/28/2020 0755 Last data filed at 01/28/2020 0752 Gross per 24 hour  Intake --  Output 1480 ml  Net -1480 ml   Filed Weights   01/23/20 0106 01/23/20 0600  Weight: 65.8 kg 65.8 kg    Examination:  General exam: No acute distress.  Looks chronically ill.  Looks older than stated age.  Poor historian.  Still mildly confused. Respiratory system: Bilateral decreased breath sounds at bases with some crackles.  No wheezing  cardiovascular system: Rate controlled, S1-S2 heard Gastrointestinal system: Abdomen is nondistended, soft and nontender.  Bowel sounds heard.   Extremities: No clubbing or cyanosis.  Right foot dressing and right thigh dressing present.   Data Reviewed: I have personally reviewed following labs and imaging studies  CBC: Recent Labs  Lab 01/23/20 0118 01/23/20 0118 01/23/20 0710 01/24/20 0706 01/26/20 0250 01/27/20 0301 01/28/20 0342  WBC 12.2*   < > 9.8 5.2 4.5 8.0 6.8  NEUTROABS 10.8*  --   --  3.5  --   --  4.0  HGB 13.5   < > 11.0* 10.4* 10.9* 10.2* 9.0*  HCT 41.5   < > 34.4* 33.1* 33.5* 31.6* 27.7*  MCV 88.1   < > 88.9 89.9 86.3 87.8 87.1  PLT 125*   < > 99* 93* 102* 132* 125*   < > = values in this interval not displayed.   Basic Metabolic  Panel: Recent Labs  Lab 01/24/20 0706 01/25/20 0320 01/26/20 0250 01/27/20 0301 01/28/20 0342  NA 138 143 140 136 140  K 3.8 3.5 3.5 4.5 3.8  CL 107 106 107 105 103  CO2 24 25 25 26 29   GLUCOSE 104* 100* 101* 166* 116*  BUN 19 12 9 14 13   CREATININE 0.78 0.72 0.71 0.86 0.63  CALCIUM 8.1* 8.3* 8.4* 8.3* 8.6*  MG 1.4*  --   --   --  1.5*   GFR: Estimated Creatinine Clearance: 81.1 mL/min (by C-G formula based on SCr of 0.63 mg/dL). Liver Function Tests: Recent Labs  Lab 01/24/20 0706 01/25/20 0320 01/26/20 0250 01/27/20 0301 01/28/20 0342  AST 18 18 16  13* 10*  ALT 12 13 14 12 11   ALKPHOS 50 50 60 57 46  BILITOT 0.6 0.6 0.6 0.4 0.3  PROT 5.7* 5.5* 6.1* 5.6* 5.3*  ALBUMIN 2.5* 2.3*  2.6* 2.5* 2.3*   No results for input(s): LIPASE, AMYLASE in the last 168 hours. No results for input(s): AMMONIA in the last 168 hours. Coagulation Profile: Recent Labs  Lab 01/23/20 0118  INR 1.2   Cardiac Enzymes: Recent Labs  Lab 01/23/20 0118  CKTOTAL 502*   BNP (last 3 results) No results for input(s): PROBNP in the last 8760 hours. HbA1C: No results for input(s): HGBA1C in the last 72 hours. CBG: No results for input(s): GLUCAP in the last 168 hours. Lipid Profile: No results for input(s): CHOL, HDL, LDLCALC, TRIG, CHOLHDL, LDLDIRECT in the last 72 hours. Thyroid Function Tests: No results for input(s): TSH, T4TOTAL, FREET4, T3FREE, THYROIDAB in the last 72 hours. Anemia Panel: No results for input(s): VITAMINB12, FOLATE, FERRITIN, TIBC, IRON, RETICCTPCT in the last 72 hours. Sepsis Labs: Recent Labs  Lab 01/23/20 0118 01/23/20 0314 01/23/20 0916  LATICACIDVEN 4.1* 3.0* 1.6    Recent Results (from the past 240 hour(s))  Blood Culture (routine x 2)     Status: None (Preliminary result)   Collection Time: 01/23/20  1:19 AM   Specimen: Right Antecubital; Blood  Result Value Ref Range Status   Specimen Description RIGHT ANTECUBITAL  Final   Special Requests    Final    BOTTLES DRAWN AEROBIC AND ANAEROBIC Blood Culture adequate volume   Culture   Final    NO GROWTH 4 DAYS Performed at [facility], [address], [place], [place] [address]    Report Status PENDING  Incomplete  Blood Culture (routine x 2)     Status: None (Preliminary result)   Collection Time: 01/23/20  1:23 AM   Specimen: Left Antecubital; Blood  Result Value Ref Range Status   Specimen Description LEFT ANTECUBITAL  Final   Special Requests   Final    BOTTLES DRAWN AEROBIC AND ANAEROBIC Blood Culture adequate volume   Culture   Final    NO GROWTH 4 DAYS Performed at [facility], [address], [place], [place] [address]    Report Status PENDING  Incomplete  Respiratory Panel by RT PCR (Flu A&B, Covid) - Nasopharyngeal Swab     Status: None   Collection Time: 01/23/20  2:00 AM   Specimen: Nasopharyngeal Swab  Result Value Ref Range Status   SARS Coronavirus 2 by RT PCR NEGATIVE NEGATIVE Final    Comment: (NOTE) SARS-CoV-2 target nucleic acids are NOT DETECTED. The SARS-CoV-2 RNA is generally detectable in upper respiratoy specimens during the acute phase of infection. The lowest concentration of SARS-CoV-2 viral copies this assay can detect is 131 copies/mL. A negative result does not preclude SARS-Cov-2 infection and should not be used as the sole basis for treatment or other patient management decisions. A negative result may occur with  improper specimen collection/handling, submission of specimen other than nasopharyngeal swab, presence of viral mutation(s) within the areas targeted by this assay, and inadequate number of viral copies (<131 copies/mL). A negative result must be combined with clinical observations, patient history, and epidemiological information. The expected result is Negative. Fact Sheet for Patients:  PinkCheek.be Fact Sheet for Healthcare Providers:  GravelBags.it This test is not  yet ap proved or cleared by the [place] FDA and  has been authorized for detection and/or diagnosis of SARS-CoV-2 by FDA under an Emergency Use Authorization (EUA). This EUA will remain  in effect (meaning this test can be used) for the duration of the COVID-19 declaration under Section 564(b)(1) of the Act, 21 U.S.C. section 360bbb-3(b)(1), unless  the authorization is terminated or revoked sooner.    Influenza A by PCR NEGATIVE NEGATIVE Final   Influenza B by PCR NEGATIVE NEGATIVE Final    Comment: (NOTE) The Xpert Xpress SARS-CoV-2/FLU/RSV assay is intended as an aid in  the diagnosis of influenza from Nasopharyngeal swab specimens and  should not be used as a sole basis for treatment. Nasal washings and  aspirates are unacceptable for Xpert Xpress SARS-CoV-2/FLU/RSV  testing. Fact Sheet for Patients: PinkCheek.be Fact Sheet for Healthcare Providers: GravelBags.it This test is not yet approved or cleared by the [place] FDA and  has been authorized for detection and/or diagnosis of SARS-CoV-2 by  FDA under an Emergency Use Authorization (EUA). This EUA will remain  in effect (meaning this test can be used) for the duration of the  Covid-19 declaration under Section 564(b)(1) of the Act, 21  U.S.C. section 360bbb-3(b)(1), unless the authorization is  terminated or revoked. Performed at [facility], [address] [place], [place] [address]   Urine culture     Status: None   Collection Time: 01/23/20  2:30 AM   Specimen: In/Out Cath Urine  Result Value Ref Range Status   Specimen Description   Final    IN/OUT CATH URINE Performed at [facility], [address] [place], [place] [address]    Special Requests   Final    NONE Performed at [facility], [address], [place], [place] [address]    Culture   Final    NO GROWTH Performed at [facility], [place] [address] [place], [place] [address]     Report Status 01/24/2020 FINAL  Final  MRSA PCR Screening     Status: None   Collection Time: 01/23/20  7:44 AM   Specimen: Nasal Mucosa; Nasopharyngeal  Result Value Ref Range Status   MRSA by PCR NEGATIVE NEGATIVE Final    Comment:        The GeneXpert MRSA Assay (FDA approved for NASAL specimens only), is one component of a comprehensive MRSA colonization surveillance program. It is not intended to diagnose MRSA infection nor to guide or monitor treatment for MRSA infections. Performed at [facility], [address] [place], [place] [address]   Surgical pcr screen     Status: None   Collection Time: 01/25/20  8:59 PM   Specimen: Nasal Mucosa; Nasal Swab  Result Value Ref Range Status   MRSA, PCR NEGATIVE NEGATIVE Final   Staphylococcus aureus NEGATIVE NEGATIVE Final    Comment: (NOTE) The Xpert SA Assay (FDA approved for NASAL specimens in patients 56 years of age and older), is one component of a comprehensive surveillance program. It is not intended to diagnose infection nor to guide or monitor treatment. Performed at [facility], [place] [address] [place], [place] [address]   Aerobic/Anaerobic Culture (surgical/deep wound)     Status: None (Preliminary result)   Collection Time: 01/26/20 10:12 AM   Specimen: PATH Other; Body Fluid  Result Value Ref Range Status   Specimen Description ABSCESS RIGHT THIGH  Final   Special Requests PATIENT ON FOLLOWING VANCOMYCIN  Final   Gram Stain   Final    FEW WBC PRESENT, PREDOMINANTLY PMN MODERATE GRAM POSITIVE COCCI IN PAIRS Performed at [facility], [address] [place], [place] [address]    Culture RARE STREPTOCOCCUS GROUP G  Final   Report Status PENDING  Incomplete         Radiology Studies: No results found.  Scheduled Meds: . amLODipine  2.5 mg Oral Daily  . aspirin EC  81 mg Oral Daily  . clopidogrel  75 mg Oral Q breakfast  . rosuvastatin  20 mg Oral Daily  . sucralfate  1 g Oral TID  WC & HS  . vitamin B-12  1,000 mcg Oral Daily  . Vitamin D (Ergocalciferol)  50,000 Units Oral Once per day on Mon Thu   Continuous Infusions: . ceFEPime (MAXIPIME) IV 2 g (01/28/20 0235)  . vancomycin 750 mg (01/28/20 0557)          Aline August, MD [facility] 01/28/2020, 7:55 AM

## 2020-01-28 NOTE — Progress Notes (Signed)
[facility] for Infectious Disease  Date of Admission:  01/23/2020     Total days of antibiotics 3         ASSESSMENT:  Raymond Walker surgical specimens are growing Streptococcus Group G. He has remained afebrile and hemodynamically stable. Will change antibiotic therapy to Cefazolin. Raymond Walker was evaluated by psychiatry with recommended psychiatric inpatient admission when medically cleared. Continue wound care per Vascular Surgery. Disposition appears to be [facility]. Palliative Care following with initiation of DNR.   PLAN:  1. Change antibiotic to Cefazolin 2. Continue wound care per Vascular Surgery 3. Psychiatric recommendations for inpatient care when medically cleared. 4. Palliative care following.   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 amLODipine  2.5 mg Oral Daily  . aspirin EC  81 mg Oral Daily  . clopidogrel  75 mg Oral Q breakfast  . rosuvastatin  20 mg Oral Daily  . senna-docusate  1 tablet Oral BID  . sucralfate  1 g Oral TID WC & HS  . vitamin B-12  1,000 mcg Oral Daily    SUBJECTIVE:  Afebrile overnight with no acute events.  Psychiatry recommending inpatient psychiatric care when able.  Surgical specimens from right thigh growing group G Streptococcus.  Has question about antibiotics.  No Known Allergies   Review of Systems: Review of Systems  Constitutional: Negative for chills, fever and weight loss.  Respiratory:  Negative for cough, shortness of breath and wheezing.   Cardiovascular: Negative for chest pain and leg swelling.  Gastrointestinal: Negative for abdominal pain, constipation, diarrhea, nausea and vomiting.  Skin: Negative for rash.      OBJECTIVE: Vitals:   01/27/20 1038 01/27/20 2110 01/28/20 0410 01/28/20 0753  BP: (!) 109/95 (!) 96/53 (!) 152/72 132/69  Pulse:  67 60 61  Resp: 18 16 18 20   Temp: 98.5 F (36.9 C) 98.3 F (36.8 C) 97.6 F (36.4 C) 98.1 F (36.7 C)  TempSrc: Oral Oral Oral Oral  SpO2:  96% 97% 95%  Weight:      Height:       Body mass index is 20.81 kg/m.  Physical Exam Constitutional:      General: He is not in acute distress.    Appearance: He is well-developed.  Cardiovascular:     Rate and Rhythm: Normal rate and regular rhythm.     Heart sounds: Normal heart sounds.  Pulmonary:     Effort: Pulmonary effort is normal.     Breath sounds: Normal breath sounds.  Musculoskeletal:     Comments: Surgical site is clean and dry.   Skin:    General: Skin is warm and dry.  Neurological:     Mental Status: He is alert and oriented to person, place, and time.  Psychiatric:        Mood and Affect: Mood normal.     Lab Results Lab Results  Component Value Date   WBC 6.8  01/28/2020   HGB 9.0 (L) 01/28/2020   HCT 27.7 (L) 01/28/2020   MCV 87.1 01/28/2020   PLT 125 (L) 01/28/2020    Lab Results  Component Value Date   CREATININE 0.63 01/28/2020   BUN 13 01/28/2020   NA 140 01/28/2020   K 3.8 01/28/2020   CL 103 01/28/2020   CO2 29 01/28/2020    Lab Results  Component Value Date   ALT 11 01/28/2020   AST 10 (L) 01/28/2020   ALKPHOS 46 01/28/2020   BILITOT 0.3 01/28/2020     Microbiology: Recent Results (from the past 240 hour(s))  Blood Culture (routine x 2)     Status: None   Collection Time: 01/23/20  1:19 AM   Specimen: Right Antecubital; Blood  Result Value Ref Range Status   Specimen Description RIGHT ANTECUBITAL  Final    Special Requests   Final    BOTTLES DRAWN AEROBIC AND ANAEROBIC Blood Culture adequate volume   Culture   Final    NO GROWTH 5 DAYS Performed at [facility], [address], [place], [place] [address]    Report Status 01/28/2020 FINAL  Final  Blood Culture (routine x 2)     Status: None   Collection Time: 01/23/20  1:23 AM   Specimen: Left Antecubital; Blood  Result Value Ref Range Status   Specimen Description LEFT ANTECUBITAL  Final   Special Requests   Final    BOTTLES DRAWN AEROBIC AND ANAEROBIC Blood Culture adequate volume   Culture   Final    NO GROWTH 5 DAYS Performed at [facility], [address] [place], [place] [address]    Report Status 01/28/2020 FINAL  Final  Respiratory Panel by RT PCR (Flu A&B, Covid) - Nasopharyngeal Swab     Status: None   Collection Time: 01/23/20  2:00 AM   Specimen: Nasopharyngeal Swab  Result Value Ref Range Status   SARS Coronavirus 2 by RT PCR NEGATIVE NEGATIVE Final    Comment: (NOTE) SARS-CoV-2 target nucleic acids are NOT DETECTED. The SARS-CoV-2 RNA is generally detectable in upper respiratoy specimens during the acute phase of infection. The lowest concentration of SARS-CoV-2 viral copies this assay can detect is 131 copies/mL. A negative result does not preclude SARS-Cov-2 infection and should not be used as the sole basis for treatment or other patient management decisions. A negative result may occur with  improper specimen collection/handling, submission of specimen other than nasopharyngeal swab, presence of viral mutation(s) within the areas targeted by this assay, and inadequate number of viral copies (<131 copies/mL). A negative result must be combined with clinical observations, patient history, and epidemiological information. The expected result is Negative. Fact Sheet for Patients:  PinkCheek.be Fact Sheet for Healthcare Providers:  GravelBags.it This test is  not yet ap proved or cleared by the [place] FDA and  has been authorized for detection and/or diagnosis of SARS-CoV-2 by FDA under an Emergency Use Authorization (EUA). This EUA will remain  in effect (meaning this test can be used) for the duration of the COVID-19 declaration under Section 564(b)(1) of the Act, 21 U.S.C. section 360bbb-3(b)(1), unless the authorization is terminated or revoked sooner.    Influenza A by PCR NEGATIVE NEGATIVE Final   Influenza B by PCR NEGATIVE NEGATIVE Final    Comment: (NOTE) The Xpert Xpress SARS-CoV-2/FLU/RSV assay is intended as an aid in  the diagnosis of influenza from Nasopharyngeal swab specimens and  should not be used as a sole basis for treatment.  Nasal washings and  aspirates are unacceptable for Xpert Xpress SARS-CoV-2/FLU/RSV  testing. Fact Sheet for Patients: PinkCheek.be Fact Sheet for Healthcare Providers: GravelBags.it This test is not yet approved or cleared by the [place] FDA and  has been authorized for detection and/or diagnosis of SARS-CoV-2 by  FDA under an Emergency Use Authorization (EUA). This EUA will remain  in effect (meaning this test can be used) for the duration of the  Covid-19 declaration under Section 564(b)(1) of the Act, 21  U.S.C. section 360bbb-3(b)(1), unless the authorization is  terminated or revoked. Performed at [facility], [address] [place], [place] [address]   Urine culture     Status: None   Collection Time: 01/23/20  2:30 AM   Specimen: In/Out Cath Urine  Result Value Ref Range Status   Specimen Description   Final    IN/OUT CATH URINE Performed at [facility], [address], [place], [place] [address]    Special Requests   Final    NONE Performed at [facility], [address], [place], [place] [address]    Culture   Final    NO GROWTH Performed at [facility], [place] [address] [place], [place] [address]     Report Status 01/24/2020 FINAL  Final  MRSA PCR Screening     Status: None   Collection Time: 01/23/20  7:44 AM   Specimen: Nasal Mucosa; Nasopharyngeal  Result Value Ref Range Status   MRSA by PCR NEGATIVE NEGATIVE Final    Comment:        The GeneXpert MRSA Assay (FDA approved for NASAL specimens only), is one component of a comprehensive MRSA colonization surveillance program. It is not intended to diagnose MRSA infection nor to guide or monitor treatment for MRSA infections. Performed at [facility], [address] [place], [place] [address]   Surgical pcr screen     Status: None   Collection Time: 01/25/20  8:59 PM   Specimen: Nasal Mucosa; Nasal Swab  Result Value Ref Range Status   MRSA, PCR NEGATIVE NEGATIVE Final   Staphylococcus aureus NEGATIVE NEGATIVE Final    Comment: (NOTE) The Xpert SA Assay (FDA approved for NASAL specimens in patients 36 years of age and older), is one component of a comprehensive surveillance program. It is not intended to diagnose infection nor to guide or monitor treatment. Performed at [facility], [place] [address] [place], [place] [address]   Aerobic/Anaerobic Culture (surgical/deep wound)     Status: None (Preliminary result)   Collection Time: 01/26/20 10:12 AM   Specimen: PATH Other; Body Fluid  Result Value Ref Range Status   Specimen Description ABSCESS RIGHT THIGH  Final   Special Requests PATIENT ON FOLLOWING VANCOMYCIN  Final   Gram Stain   Final    FEW WBC PRESENT, PREDOMINANTLY PMN MODERATE GRAM POSITIVE COCCI IN PAIRS Performed at [facility], [address], [place], [place] [address]    Culture RARE STREPTOCOCCUS GROUP G  Final   Report Status PENDING  Incomplete     Terri Piedra, NP [facility] [facility] [phone] Pager  01/28/2020  10:32 AM

## 2020-01-28 NOTE — Progress Notes (Signed)
Pharmacy Antibiotic Note  Raymond Walker. is a 69 y.o. male admitted on 01/23/2020 with altered mental status.  Pharmacy has been consulted for vancomycin and cefepime dosing now consulted to change to cefazolin for right thigh abscess and possible graft infection and transmetatarsal amputation of R foot. S/p I&D and amputation on 2/9. Right thigh abscess culture growing rare strep group G. CT chest found nodules that could possibly represent septic emboli or malignancy. Patient has been receiving vancomycin 750mg  IV q12h and cefepime 2g IV q8h and now will start cefazolin.   Plan: Start cefazolin 2g IV q8h Monitor renal function, cultures/sensitivies, and clinical progression   Height: 5\' 10"  (177.8 cm) Weight: 145 lb 1 oz (65.8 kg) IBW/kg (Calculated) : 73  Temp (24hrs), Avg:98.1 F (36.7 C), Min:97.6 F (36.4 C), Max:98.5 F (36.9 C)  Recent Labs  Lab 01/23/20 0118 01/23/20 0118 01/23/20 0314 01/23/20 0710 01/23/20 0916 01/24/20 0706 01/25/20 0320 01/26/20 0250 01/27/20 0301 01/27/20 1808 01/28/20 0342  WBC 12.2*   < >  --  9.8  --  5.2  --  4.5 8.0  --  6.8  CREATININE 1.03   < >  --   --   --  0.78 0.72 0.71 0.86  --  0.63  LATICACIDVEN 4.1*  --  3.0*  --  1.6  --   --   --   --   --   --   VANCOTROUGH  --   --   --   --   --   --   --   --   --   --  11*  VANCOPEAK  --   --   --   --   --   --   --   --   --  31  --    < > = values in this interval not displayed.    Estimated Creatinine Clearance: 81.1 mL/min (by C-G formula based on SCr of 0.63 mg/dL).    No Known Allergies  Antimicrobials this admission: Vancomycin 2/6 >>2/11 Cefepime 2/6 >> 2/11 Cefazolin 2/11>>  Dose adjustments this admission: N/A  Microbiology results: 2/6 Bcx: ngtd  2/6 Ucx: negative  2/9 Graft cx: rare streptococcus group G   Thank you for allowing pharmacy to be a part of this patient's care.  Cristela Felt, PharmD PGY1 Pharmacy Resident [employer]: [phone]   01/28/2020 7:10  AM
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
## 2020-01-28 NOTE — TOC Progression Note (Addendum)
Transition of Care (TOC) - Progression Note    Patient Details  Name: Raymond Walker. MRN: [id number] Date of Birth: August 24, 1951  Transition of Care [facility]) CM/SW [place], [place] Phone Number: 01/28/2020, 2:30 PM  Clinical Narrative:     CSW visited with patient at bedside. CSW discussed with patient SNF placement at [facility]. Patient states he is agreeable to ST rehab at [facility]. CSW discussed with the patient recommendation from psychiatry for  inpatient and outpatient menental health resources in the community.Patient states he wants it to be clear that he is not a danger to himself or others.   CSW provided patient with [place] resources. CSW will continue to follow and assist with discharge planning.   Thurmond Butts, MSW, [place] Clinical Social Worker   Expected Discharge Plan: Skilled Nursing Facility Barriers to Discharge: Continued Medical Work up  Expected Discharge Plan and Services Expected Discharge Plan: [place] arrangements for the past 2 months: Single Family Home                                       Social Determinants of Health (SDOH) Interventions    Readmission Risk Interventions No flowsheet data found.

## 2020-01-28 NOTE — Progress Notes (Signed)
Physical Therapy Treatment Patient Details Name: Raymond Walker. MRN: [id number] DOB: Aug 20, 1951 Today's Date: 01/28/2020    History of Present Illness Pt is a 69 y.o. male admitted from home on 01/23/20 for AMS and R foot injury, pt apparently found laying on couch soiled with urine by friend. Worked up for sespsis secondary to suspected graft infection, along with necrotic changes to R distal foot. S/p R foot transmet amputation 2/9. PMH includes fem-pop graft 08/2019, R great and second toe amputations, COPD, HTN, pancreatitis, thrombocytopenia.   PT Comments    Pt progressing with mobility, now s/p R transmetatarsal amputation. Pt declining use of darco shoe to allow for WB thru heel while ambulating , despite education regarding importance of maintaining precautions especially since pt with poor R foot sensation. Pt requires min guard for mobility due to fall risk. Reviewed educ re: therex, edema control, activity recommendations and importance of mobility. Continue to recommend SNF-level therapies to maximize functional mobility and independence.   Follow Up Recommendations  SNF;Supervision for mobility/OOB     Equipment Recommendations  None recommended by PT    Recommendations for Other Services       Precautions / Restrictions Precautions Precautions: Fall Restrictions Weight Bearing Restrictions: Yes RLE Weight Bearing: Partial weight bearing Other Position/Activity Restrictions: Assume WB thru heel with darco shoe - pt refuses to wear darco shoe, does attempt to walk on heel    Mobility  Bed Mobility Overal bed mobility: Independent                Transfers Overall transfer level: Needs assistance Equipment used: None Transfers: Sit to/from Stand Sit to Stand: Supervision         General transfer comment: Declined use of darco shoe. Supervision for safety, cues to WB thru heel  Ambulation/Gait Ambulation/Gait assistance: Min guard Gait Distance (Feet):  20 Feet Assistive device: None Gait Pattern/deviations: Step-through pattern;Decreased stride length;Decreased weight shift to right Gait velocity: Decreased   General Gait Details: Slow, mildly unsteady gait in room with min guard for balance; declined use of DME, reaching to furniture/sink for UE support; declined use of darco shoe, attempting to WB thru heel. Tested poor sensation in R foot and pt aware this means he could actually be WB thru distal foot and not aware, but still declines use of darco   Stairs             Wheelchair Mobility    Modified Rankin (Stroke Patients Only)       Balance Overall balance assessment: Needs assistance   Sitting balance-Leahy Scale: Good       Standing balance-Leahy Scale: Fair Standing balance comment: able to ambulate without UE support, requires close guard for safety                            Cognition Arousal/Alertness: Awake/alert Behavior During Therapy: [facility] for tasks assessed/performed;Impulsive Overall Cognitive Status: No family/caregiver present to determine baseline cognitive functioning Area of Impairment: Safety/judgement;Attention                   Current Attention Level: Selective           General Comments: A&Ox4, agreeable and appropriate throughout session. Intermittent redirection to task and current conversation      Exercises Other Exercises Other Exercises: Educ on LAQ, seated hip flex, hamstring stretch and gastroc stretch (pt states, "I'm not ready to straighten my knee yet" with regards  to stretching, but able to perform LAQ through full ROM)    General Comments        Pertinent Vitals/Pain Pain Assessment: No/denies pain Pain Intervention(s): Monitored during session    Home Living                      Prior Function            PT Goals (current goals can now be found in the care plan section) Progress towards PT goals: Progressing toward goals     Frequency    Min 2X/week      PT Plan Frequency needs to be updated    Co-evaluation              AM-PAC PT "6 Clicks" Mobility   Outcome Measure  Help needed turning from your back to your side while in a flat bed without using bedrails?: None Help needed moving from lying on your back to sitting on the side of a flat bed without using bedrails?: None Help needed moving to and from a bed to a chair (including a wheelchair)?: A Little Help needed standing up from a chair using your arms (e.g., wheelchair or bedside chair)?: A Little Help needed to walk in hospital room?: A Little Help needed climbing 3-5 steps with a railing? : A Lot 6 Click Score: 19    End of Session   Activity Tolerance: Patient tolerated treatment well Patient left: in bed;with call bell/phone within reach Nurse Communication: Mobility status PT Visit Diagnosis: Unsteadiness on feet (R26.81);Difficulty in walking, not elsewhere classified (R26.2)     Time: [id number] PT Time Calculation (min) (ACUTE ONLY): 25 min  Charges:  $Gait Training: 8-22 mins $Therapeutic Exercise: 8-22 mins                    Mabeline Caras, PT, DPT Acute Rehabilitation Services  Pager [phone] Office [place] 01/28/2020, 11:51 AM

## 2020-01-28 NOTE — Progress Notes (Signed)
Katharine Look RN , patient and niece is aware that  PICC placement will be done tomorrow.

## 2020-01-28 NOTE — Progress Notes (Addendum)
[facility] [place]  Subjective  - Doing well over all.   Objective 132/69 61 98.1 F (36.7 C) (Oral) 20 95%  Intake/Output Summary (Last 24 hours) at 01/28/2020 1051 Last data filed at 01/28/2020 0752 Gross per 24 hour  Intake --  Output 1155 ml  Net -1155 ml    Left medial thigh vein harvest site clean with no purulent drainage.  Wet to dry dressing changed. Right TMA incision intact, warm to touch.  Doppler signals intact DP/PT with brisk peroneal.   \Lungs non labored breathing   Assessment/Planning: POD # 2 68 y.o. male is s/p: 1. Exploration of right above-knee popliteal graft anastomosis 2.Incision and drainage right medial thigh abscess 3.Transmetatarsal amputation right foot Patent right LE bypass with healing TMA.  Continue wet to dry right thigh vein harvest site daily.    ID has not seen patient today yet, but recommended continued IV antibiotics to include vancomycin and cefepime with close renal monitoring.  Cr 0.63.  WBC 6.8 and he is afebrile.   Collected: 01/26/20 1012  Result status: Final  Resulting lab: [place] [facility]  Value: FEW WBC PRESENT, PREDOMINANTLY PMN  MODERATE GRAM POSITIVE COCCI IN PAIRS     Component 2 d ago  Specimen Description ABSCESS RIGHT THIGH   Special Requests PATIENT ON FOLLOWING VANCOMYCIN   Gram Stain FEW WBC PRESENT, PREDOMINANTLY PMN  MODERATE GRAM POSITIVE COCCI IN PAIRS  Performed at [facility], [place] [address], [place], [place] [address]   Culture RARE STREPTOCOCCUS GROUP Marylu Lund 01/28/2020 10:51 AM --  Laboratory Lab Results: Recent Labs    01/27/20 0301 01/28/20 0342  WBC 8.0 6.8  HGB 10.2* 9.0*  HCT 31.6* 27.7*  PLT 132* 125*   BMET Recent Labs    01/27/20 0301 01/28/20 0342  NA 136 140  K 4.5 3.8  CL 105 103  CO2 26 29  GLUCOSE 166* 116*  BUN 14 13  CREATININE 0.86 0.63  CALCIUM 8.3* 8.6*    COAG Lab Results   Component Value Date   INR 1.2 01/23/2020   INR 1.2 09/15/2019   INR 1.1 08/19/2019   No results found for: PTT    I have interviewed and examined patient with PA and agree with assessment and plan above.   Allissa Albright C. Donzetta Matters, MD [facility] Office: [phone] Pager: [phone]

## 2020-01-29 DIAGNOSIS — Z95828 Presence of other vascular implants and grafts: Secondary | ICD-10-CM

## 2020-01-29 DIAGNOSIS — E43 Unspecified severe protein-calorie malnutrition: Secondary | ICD-10-CM

## 2020-01-29 DIAGNOSIS — T8140XA Infection following a procedure, unspecified, initial encounter: Secondary | ICD-10-CM

## 2020-01-29 DIAGNOSIS — B954 Other streptococcus as the cause of diseases classified elsewhere: Secondary | ICD-10-CM

## 2020-01-29 LAB — BASIC METABOLIC PANEL
Anion gap: 8 (ref 5–15)
BUN: 9 mg/dL (ref 8–23)
CO2: 30 mmol/L (ref 22–32)
Calcium: 8.8 mg/dL — ABNORMAL LOW (ref 8.9–10.3)
Chloride: 100 mmol/L (ref 98–111)
Creatinine, Ser: 0.6 mg/dL — ABNORMAL LOW (ref 0.61–1.24)
GFR calc Af Amer: >60 mL/min (ref >60)
GFR calc non Af Amer: >60 mL/min (ref >60)
Glucose, Bld: 106 mg/dL — ABNORMAL HIGH (ref 70–99)
Potassium: 3.5 mmol/L (ref 3.5–5.1)
Sodium: 138 mmol/L (ref 135–145)

## 2020-01-29 LAB — CBC WITH DIFFERENTIAL/PLATELET
Abs Immature Granulocytes: 0.07 10*3/uL (ref 0.00–0.07)
Basophils Absolute: 0 10*3/uL (ref 0.0–0.1)
Basophils Relative: 0 %
Eosinophils Absolute: 0.1 10*3/uL (ref 0.0–0.5)
Eosinophils Relative: 2 %
HCT: 29.8 % — ABNORMAL LOW (ref 39.0–52.0)
Hemoglobin: 9.7 g/dL — ABNORMAL LOW (ref 13.0–17.0)
Immature Granulocytes: 1 %
Lymphocytes Relative: 25 %
Lymphs Abs: 1.7 10*3/uL (ref 0.7–4.0)
MCH: 28.2 pg (ref 26.0–34.0)
MCHC: 32.6 g/dL (ref 30.0–36.0)
MCV: 86.6 fL (ref 80.0–100.0)
Monocytes Absolute: 0.7 10*3/uL (ref 0.1–1.0)
Monocytes Relative: 10 %
Neutro Abs: 4.1 10*3/uL (ref 1.7–7.7)
Neutrophils Relative %: 62 %
Platelets: 153 10*3/uL (ref 150–400)
RBC: 3.44 MIL/uL — ABNORMAL LOW (ref 4.22–5.81)
RDW: 14.5 % (ref 11.5–15.5)
WBC: 6.7 10*3/uL (ref 4.0–10.5)
nRBC: 0 % (ref 0.0–0.2)

## 2020-01-29 LAB — C-REACTIVE PROTEIN: CRP: 2.6 mg/dL — ABNORMAL HIGH (ref <1.0)

## 2020-01-29 LAB — MAGNESIUM: Magnesium: 1.5 mg/dL — ABNORMAL LOW (ref 1.7–2.4)

## 2020-01-29 LAB — SARS CORONAVIRUS 2 (TAT 6-24 HRS): SARS Coronavirus 2: NEGATIVE

## 2020-01-29 MED ORDER — SODIUM CHLORIDE 0.9% FLUSH: 10.0000 mL | INTRAVENOUS | Status: DC | PRN

## 2020-01-29 MED ORDER — CHLORHEXIDINE GLUCONATE CLOTH 2 % EX PADS
6.0000 | MEDICATED_PAD | Freq: Every day | CUTANEOUS | Status: DC
  Administered 2020-01-29 – 2020-01-30 (×2): 6 via TOPICAL

## 2020-01-29 MED ORDER — SODIUM CHLORIDE 0.9% FLUSH
10.0000 mL | Freq: Two times a day (BID) | INTRAVENOUS | Status: DC
  Administered 2020-01-29 (×2): 10 mL

## 2020-01-29 NOTE — Care Management Important Message (Signed)
Important Message  Patient Details  Name: Raymond Walker. MRN: [id number] Date of Birth: [birth year]   Medicare Important Message Given:  Yes     [place], [place] 01/29/2020, 11:07 AM

## 2020-01-29 NOTE — Progress Notes (Signed)
PHARMACY CONSULT NOTE FOR:  OUTPATIENT  PARENTERAL ANTIBIOTIC THERAPY (OPAT)  Indication: group G strep postsurgical wound infection  Regimen: cefazolin 2g IV q8h  End date: 03/08/2020   IV antibiotic discharge orders are pended. To discharging provider:  please sign these orders via discharge navigator,  Select New Orders & click on the button choice - Manage This Unsigned Work.     Thank you for allowing pharmacy to be a part of this patient's care.  Cristela Felt, PharmD PGY1 Pharmacy Resident [employer]: [phone]  01/29/2020, 12:02 PM

## 2020-01-29 NOTE — Plan of Care (Signed)
°  Problem: Clinical Measurements: °Goal: Will remain free from infection °Outcome: Progressing °Goal: Respiratory complications will improve °Outcome: Progressing °  °

## 2020-01-29 NOTE — Progress Notes (Signed)
[facility] for Infectious Disease  Date of Admission:  01/23/2020     Total days of antibiotics 4         ASSESSMENT:  Raymond Walker has remained afebrile and hemodynamically stable with no acute events. Plan for 6 weeks of Cefazolin for Group G streptococcus infection followed by chronic suppression with Keflex given concern for graft infection. PICC line in place. OPAT orders will be placed and will arrange for follow up in ID clinic.   PLAN:  1. Continue cefazolin. 2. OPAT orders below. 3. Continue wound care per Vascular Surgery 4. Follow up with ID clinic  Diagnosis: Group G Streptococcus post surgical infection   Culture Result: Group G Streptococcus   No Known Allergies  OPAT Orders Discharge antibiotics: Cefazolin  Per pharmacy protocol Aim for Vancomycin trough 15-20 or AUC 400-550 (unless otherwise indicated) Duration: 6 weeks  End Date: 03/08/20  [facility] Care Per Protocol:  Home health RN for IV administration and teaching; PICC line care and labs.    Labs weekly while on IV antibiotics: _X_ CBC with differential _X_ BMP __ CMP _X_ CRP _X_ ESR __ Vancomycin trough __ CK  __ Please pull PIC at completion of IV antibiotics _X_ Please leave PIC in place until doctor has seen patient or been notified  Fax weekly labs to [phone]  Clinic Follow Up Appt:  11:15 on 3/15 with Dr. 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  amLODipine  2.5 mg Oral Daily  . aspirin EC  81 mg Oral Daily  . clopidogrel  75 mg Oral Q breakfast  . rosuvastatin  20 mg Oral Daily  . senna-docusate  1 tablet Oral BID  . sucralfate  1 g Oral TID WC & HS  . vitamin B-12  1,000 mcg Oral Daily    SUBJECTIVE:  Afebrile overnight with no acute events.   No Known Allergies   Review of Systems: Review of Systems  Constitutional: Negative for chills, fever and weight loss.  Respiratory: Negative for cough, shortness of breath and wheezing.   Cardiovascular: Negative for chest pain and leg swelling.  Gastrointestinal: Negative for abdominal pain, constipation, diarrhea, nausea and vomiting.  Skin: Negative for rash.      OBJECTIVE: Vitals:   01/28/20 1146 01/28/20 2014 01/29/20 0541 01/29/20 1007  BP: (!) 139/50 (!) 156/78 (!) 163/90 137/71  Pulse: 60 70    Resp: 18 20    Temp: 97.8 F (36.6 C) 98.9 F (37.2 C) 99.1 F (37.3 C)   TempSrc: Oral Oral Oral   SpO2: 98% 99% 99%   Weight:      Height:       Body mass index is 20.81 kg/m.  Physical Exam Constitutional:      General: He is not in acute distress.    Appearance: He is well-developed.  Cardiovascular:     Rate and Rhythm: Normal rate and regular  rhythm.     Heart sounds: Normal heart sounds.  Pulmonary:     Effort: Pulmonary effort is normal.     Breath sounds: Normal breath sounds.  Musculoskeletal:     Comments: Surgical site around foot appears clean and dry  Skin:    General: Skin is warm and dry.  Neurological:     Mental Status: He is alert.     Lab Results Lab Results  Component Value Date   WBC 6.7 01/29/2020   HGB 9.7 (L) 01/29/2020   HCT 29.8 (L) 01/29/2020   MCV 86.6 01/29/2020   PLT 153 01/29/2020    Lab Results  Component Value Date   CREATININE 0.60 (L) 01/29/2020   BUN 9 01/29/2020   NA 138 01/29/2020   K 3.5 01/29/2020   CL 100 01/29/2020   CO2 30 01/29/2020    Lab Results  Component Value Date   ALT 11 01/28/2020     AST 10 (L) 01/28/2020   ALKPHOS 46 01/28/2020   BILITOT 0.3 01/28/2020     Microbiology: Recent Results (from the past 240 hour(s))  Blood Culture (routine x 2)     Status: None   Collection Time: 01/23/20  1:19 AM   Specimen: Right Antecubital; Blood  Result Value Ref Range Status   Specimen Description RIGHT ANTECUBITAL  Final   Special Requests   Final    BOTTLES DRAWN AEROBIC AND ANAEROBIC Blood Culture adequate volume   Culture   Final    NO GROWTH 5 DAYS Performed at [facility], [address], [place], [place] [address]    Report Status 01/28/2020 FINAL  Final  Blood Culture (routine x 2)     Status: None   Collection Time: 01/23/20  1:23 AM   Specimen: Left Antecubital; Blood  Result Value Ref Range Status   Specimen Description LEFT ANTECUBITAL  Final   Special Requests   Final    BOTTLES DRAWN AEROBIC AND ANAEROBIC Blood Culture adequate volume   Culture   Final    NO GROWTH 5 DAYS Performed at [facility], [address] [place], [place] [address]    Report Status 01/28/2020 FINAL  Final  Respiratory Panel by RT PCR (Flu A&B, Covid) - Nasopharyngeal Swab     Status: None   Collection Time: 01/23/20  2:00 AM   Specimen: Nasopharyngeal Swab  Result Value Ref Range Status   SARS Coronavirus 2 by RT PCR NEGATIVE NEGATIVE Final    Comment: (NOTE) SARS-CoV-2 target nucleic acids are NOT DETECTED. The SARS-CoV-2 RNA is generally detectable in upper respiratoy specimens during the acute phase of infection. The lowest concentration of SARS-CoV-2 viral copies this assay can detect is 131 copies/mL. A negative result does not preclude SARS-Cov-2 infection and should not be used as the sole basis for treatment or other patient management decisions. A negative result may occur with  improper specimen collection/handling, submission of specimen other than nasopharyngeal swab, presence of viral mutation(s) within the areas targeted by this assay, and inadequate number  of viral copies (<131 copies/mL). A negative result must be combined with clinical observations, patient history, and epidemiological information. The expected result is Negative. Fact Sheet for Patients:  PinkCheek.be Fact Sheet for Healthcare Providers:  GravelBags.it This test is not yet ap proved or cleared by the [place] FDA and  has been authorized for detection and/or diagnosis of SARS-CoV-2 by FDA under an Emergency Use Authorization (EUA). This EUA will remain  in effect (meaning this test  can be used) for the duration of the COVID-19 declaration under Section 564(b)(1) of the Act, 21 U.S.C. section 360bbb-3(b)(1), unless the authorization is terminated or revoked sooner.    Influenza A by PCR NEGATIVE NEGATIVE Final   Influenza B by PCR NEGATIVE NEGATIVE Final    Comment: (NOTE) The Xpert Xpress SARS-CoV-2/FLU/RSV assay is intended as an aid in  the diagnosis of influenza from Nasopharyngeal swab specimens and  should not be used as a sole basis for treatment. Nasal washings and  aspirates are unacceptable for Xpert Xpress SARS-CoV-2/FLU/RSV  testing. Fact Sheet for Patients: PinkCheek.be Fact Sheet for Healthcare Providers: GravelBags.it This test is not yet approved or cleared by the [place] FDA and  has been authorized for detection and/or diagnosis of SARS-CoV-2 by  FDA under an Emergency Use Authorization (EUA). This EUA will remain  in effect (meaning this test can be used) for the duration of the  Covid-19 declaration under Section 564(b)(1) of the Act, 21  U.S.C. section 360bbb-3(b)(1), unless the authorization is  terminated or revoked. Performed at [facility], [address], [place], [place] [address]   Urine culture     Status: None   Collection Time: 01/23/20  2:30 AM   Specimen: In/Out Cath Urine  Result Value Ref Range Status    Specimen Description   Final    IN/OUT CATH URINE Performed at [facility], [address] [place], [place] [address]    Special Requests   Final    NONE Performed at [facility], [address], [place], [place] [address]    Culture   Final    NO GROWTH Performed at [facility], [place] [address] [place], [place] [address]    Report Status 01/24/2020 FINAL  Final  MRSA PCR Screening     Status: None   Collection Time: 01/23/20  7:44 AM   Specimen: Nasal Mucosa; Nasopharyngeal  Result Value Ref Range Status   MRSA by PCR NEGATIVE NEGATIVE Final    Comment:        The GeneXpert MRSA Assay (FDA approved for NASAL specimens only), is one component of a comprehensive MRSA colonization surveillance program. It is not intended to diagnose MRSA infection nor to guide or monitor treatment for MRSA infections. Performed at [facility], [address], [place], [place] [address]   Surgical pcr screen     Status: None   Collection Time: 01/25/20  8:59 PM   Specimen: Nasal Mucosa; Nasal Swab  Result Value Ref Range Status   MRSA, PCR NEGATIVE NEGATIVE Final   Staphylococcus aureus NEGATIVE NEGATIVE Final    Comment: (NOTE) The Xpert SA Assay (FDA approved for NASAL specimens in patients 30 years of age and older), is one component of a comprehensive surveillance program. It is not intended to diagnose infection nor to guide or monitor treatment. Performed at [facility], [place] [address] [place], [place] [address]   Aerobic/Anaerobic Culture (surgical/deep wound)     Status: None (Preliminary result)   Collection Time: 01/26/20 10:12 AM   Specimen: PATH Other; Body Fluid  Result Value Ref Range Status   Specimen Description ABSCESS RIGHT THIGH  Final   Special Requests PATIENT ON FOLLOWING VANCOMYCIN  Final   Gram Stain   Final    FEW WBC PRESENT, PREDOMINANTLY PMN MODERATE GRAM POSITIVE COCCI IN PAIRS Performed at [facility], [address],  [place], [place] [address]    Culture   Final  RARE STREPTOCOCCUS GROUP G NO ANAEROBES ISOLATED; CULTURE IN PROGRESS FOR 5 DAYS    Report Status PENDING  Incomplete     Terri Piedra, NP [place] [facility] [phone] Pager  01/29/2020  11:30 AM

## 2020-01-29 NOTE — Progress Notes (Signed)
Patient ID: Raymond Radon., male   DOB: Jun 07, 1951, 69 y.o.   MRN: [id number]  PROGRESS NOTE    Raymond Radon.  [id number] DOB: May 04, 1951 DOA: 01/23/2020 PCP: [name], MD   Brief Narrative:  69 year old male with history of COPD, hypertension, thrombocytopenia, status post right femoral to above-knee popliteal bypass graft and right toes amputation on 09/15/2019 by vascular surgery presented with altered mental status and severe sepsis.  Apparently, neighbor had found him on the couch, not moving, confused, incontinent of urine and feces.  Patient was found to have bleeding from the right toe amputation site, lactic acid was 4.1.  He was empirically started on vancomycin and cefepime for sepsis.  Vascular surgery was consulted.  ID was also subsequently consulted.  He underwent I&D of right medial thigh abscess along with exploration of right above knee popliteal graft anastomosis and transmetatarsal amputation of right foot on 01/26/2020 by vascular surgery  Assessment & Plan:   Sepsis: Present on admission -Probably from below.  Currently on Ancef.   Currently hemodynamically stable.  Blood and urine cultures have been negative so far. -Sepsis is resolved  Right thigh abscess Right foot osteomyelitis -Status post I&D of right thigh abscess along with transmetatarsal amputation of right foot on 01/26/2020 by vascular surgery.  Wound care as per vascular surgery recommendations.  Pain management as per vascular surgery recommendations.  ID following.  OR  Culture is growing rare Streptococcus group G.  ID recommended 6 weeks of Ancef.  Patient will need PICC line.  Peripheral vascular disease with history of femoropopliteal bypass -Vascular Surgery following.  No evidence of infection on surgical exploration on 01/26/2020.  Acute metabolic encephalopathy -Most likely from above.  Resolved.  Monitor mental status.  Fall  precautions.  Hypomagnesemia -Replace.  Thrombocytopenia -Improved.  Severe vitamin D deficiency -Continue supplementation  Hematuria Chronic bladder outlet obstruction from enlarged prostate -Hematuria is chronic.  CT shows that he has renal stones. -In and out cath as needed if unable to void.  Follow-up with urology as an outpatient.  Anemia of chronic disease -Hemoglobin stable.   DVT prophylaxis: SCDs.  Lovenox/heparin on hold because of hematuria and thrombocytopenia Code Status: DNR Family Communication: None at bedside Disposition Plan: SNF once bed is available.  Patient is currently medically stable for discharge as soon as patient has PICC line placement, earlier today  Consultants: Vascular surgery/ID  Procedures:  I&D of right medial thigh abscess along with exploration of right above knee popliteal graft anastomosis and transmetatarsal amputation of right foot on 01/26/2020 by vascular surgery  Antimicrobials:  Anti-infectives (From admission, onward)   Start     Dose/Rate Route Frequency Ordered Stop   01/28/20 1800  ceFAZolin (ANCEF) IVPB 1 g/50 mL premix  Status:  Discontinued     1 g 100 mL/hr over 30 Minutes Intravenous Every 8 hours 01/28/20 0908 01/28/20 1459   01/28/20 1800  ceFAZolin (ANCEF) IVPB 2g/100 mL premix     2 g 200 mL/hr over 30 Minutes Intravenous Every 8 hours 01/28/20 1459     01/26/20 1000  ceFEPIme (MAXIPIME) 2 g in sodium chloride 0.9 % 100 mL IVPB  Status:  Discontinued     2 g 200 mL/hr over 30 Minutes Intravenous To Surgery 01/26/20 0958 01/26/20 1159   01/24/20 1600  vancomycin (VANCOREADY) IVPB 750 mg/150 mL  Status:  Discontinued     750 mg 150 mL/hr over 60 Minutes Intravenous Every 12 hours 01/24/20 1056 01/28/20  [id number]   01/23/20 1600  vancomycin (VANCOREADY) IVPB 750 mg/150 mL  Status:  Discontinued     750 mg 150 mL/hr over 60 Minutes Intravenous Every 12 hours 01/23/20 0256 01/24/20 0800   01/23/20 1000  ceFEPIme  (MAXIPIME) 2 g in sodium chloride 0.9 % 100 mL IVPB  Status:  Discontinued     2 g 200 mL/hr over 30 Minutes Intravenous Every 8 hours 01/23/20 0256 01/28/20 0903   01/23/20 0115  ceFEPIme (MAXIPIME) 2 g in sodium chloride 0.9 % 100 mL IVPB     2 g 200 mL/hr over 30 Minutes Intravenous  Once 01/23/20 0111 01/23/20 0240   01/23/20 0115  metroNIDAZOLE (FLAGYL) IVPB 500 mg     500 mg 100 mL/hr over 60 Minutes Intravenous  Once 01/23/20 0111 01/23/20 0301   01/23/20 0115  vancomycin (VANCOCIN) IVPB 1000 mg/200 mL premix     1,000 mg 200 mL/hr over 60 Minutes Intravenous  Once 01/23/20 0111 01/23/20 0342      Subjective: Patient seen and examined at bedside.  Very poor historian.  Sleepy, wakes up slightly, still slightly confused.  No agitation, fever, vomiting reported.   Objective: Vitals:   01/28/20 0753 01/28/20 1146 01/28/20 2014 01/29/20 0541  BP: 132/69 (!) 139/50 (!) 156/78 (!) 163/90  Pulse: 61 60 70   Resp: 20 18 20    Temp: 98.1 F (36.7 C) 97.8 F (36.6 C) 98.9 F (37.2 C) 99.1 F (37.3 C)  TempSrc: Oral Oral Oral Oral  SpO2: 95% 98% 99% 99%  Weight:      Height:        Intake/Output Summary (Last 24 hours) at 01/29/2020 0756 Last data filed at 01/29/2020 [id number] Gross per 24 hour  Intake 200 ml  Output 3225 ml  Net -3025 ml   Filed Weights   01/23/20 0106 01/23/20 0600  Weight: 65.8 kg 65.8 kg    Examination:  General exam: No distress.  Looks chronically ill.  Looks older than stated age.  Poor historian.   Sleepy, wakes up slightly, still slightly confused Respiratory system: Bilateral decreased breath sounds at bases with scattered crackles.   Cardiovascular system: S1-S2 heard, rate controlled Gastrointestinal system: Abdomen is nondistended, soft and nontender.  Normal bowel sounds heard.   Extremities: No clubbing.  Right foot dressing and right thigh dressing present.   Data Reviewed: I have personally reviewed following labs and imaging  studies  CBC: Recent Labs  Lab 01/23/20 0118 01/23/20 0710 01/24/20 0706 01/26/20 0250 01/27/20 0301 01/28/20 0342 01/29/20 0234  WBC 12.2*   < > 5.2 4.5 8.0 6.8 6.7  NEUTROABS 10.8*  --  3.5  --   --  4.0 4.1  HGB 13.5   < > 10.4* 10.9* 10.2* 9.0* 9.7*  HCT 41.5   < > 33.1* 33.5* 31.6* 27.7* 29.8*  MCV 88.1   < > 89.9 86.3 87.8 87.1 86.6  PLT 125*   < > 93* 102* 132* 125* 153   < > = values in this interval not displayed.   Basic Metabolic Panel: Recent Labs  Lab 01/24/20 0706 01/24/20 0706 01/25/20 0320 01/26/20 0250 01/27/20 0301 01/28/20 0342 01/29/20 0234  NA 138   < > 143 140 136 140 138  K 3.8   < > 3.5 3.5 4.5 3.8 3.5  CL 107   < > 106 107 105 103 100  CO2 24   < > 25 25 26 29 30   GLUCOSE 104*   < >  100* 101* 166* 116* 106*  BUN 19   < > 12 9 14 13 9   CREATININE 0.78   < > 0.72 0.71 0.86 0.63 0.60*  CALCIUM 8.1*   < > 8.3* 8.4* 8.3* 8.6* 8.8*  MG 1.4*  --   --   --   --  1.5* 1.5*   < > = values in this interval not displayed.   GFR: Estimated Creatinine Clearance: 81.1 mL/min (A) (by C-G formula based on SCr of 0.6 mg/dL (L)). Liver Function Tests: Recent Labs  Lab 01/24/20 0706 01/25/20 0320 01/26/20 0250 01/27/20 0301 01/28/20 0342  AST 18 18 16  13* 10*  ALT 12 13 14 12 11   ALKPHOS 50 50 60 57 46  BILITOT 0.6 0.6 0.6 0.4 0.3  PROT 5.7* 5.5* 6.1* 5.6* 5.3*  ALBUMIN 2.5* 2.3* 2.6* 2.5* 2.3*   No results for input(s): LIPASE, AMYLASE in the last 168 hours. No results for input(s): AMMONIA in the last 168 hours. Coagulation Profile: Recent Labs  Lab 01/23/20 0118  INR 1.2   Cardiac Enzymes: Recent Labs  Lab 01/23/20 0118  CKTOTAL 502*   BNP (last 3 results) No results for input(s): PROBNP in the last 8760 hours. HbA1C: No results for input(s): HGBA1C in the last 72 hours. CBG: No results for input(s): GLUCAP in the last 168 hours. Lipid Profile: No results for input(s): CHOL, HDL, LDLCALC, TRIG, CHOLHDL, LDLDIRECT in the last 72  hours. Thyroid Function Tests: No results for input(s): TSH, T4TOTAL, FREET4, T3FREE, THYROIDAB in the last 72 hours. Anemia Panel: No results for input(s): VITAMINB12, FOLATE, FERRITIN, TIBC, IRON, RETICCTPCT in the last 72 hours. Sepsis Labs: Recent Labs  Lab 01/23/20 0118 01/23/20 0314 01/23/20 0916  LATICACIDVEN 4.1* 3.0* 1.6    Recent Results (from the past 240 hour(s))  Blood Culture (routine x 2)     Status: None   Collection Time: 01/23/20  1:19 AM   Specimen: Right Antecubital; Blood  Result Value Ref Range Status   Specimen Description RIGHT ANTECUBITAL  Final   Special Requests   Final    BOTTLES DRAWN AEROBIC AND ANAEROBIC Blood Culture adequate volume   Culture   Final    NO GROWTH 5 DAYS Performed at [facility], [address], [place], [place] [address]    Report Status 01/28/2020 FINAL  Final  Blood Culture (routine x 2)     Status: None   Collection Time: 01/23/20  1:23 AM   Specimen: Left Antecubital; Blood  Result Value Ref Range Status   Specimen Description LEFT ANTECUBITAL  Final   Special Requests   Final    BOTTLES DRAWN AEROBIC AND ANAEROBIC Blood Culture adequate volume   Culture   Final    NO GROWTH 5 DAYS Performed at [facility], [address], [place], [place] [address]    Report Status 01/28/2020 FINAL  Final  Respiratory Panel by RT PCR (Flu A&B, Covid) - Nasopharyngeal Swab     Status: None   Collection Time: 01/23/20  2:00 AM   Specimen: Nasopharyngeal Swab  Result Value Ref Range Status   SARS Coronavirus 2 by RT PCR NEGATIVE NEGATIVE Final    Comment: (NOTE) SARS-CoV-2 target nucleic acids are NOT DETECTED. The SARS-CoV-2 RNA is generally detectable in upper respiratoy specimens during the acute phase of infection. The lowest concentration of SARS-CoV-2 viral copies this assay can detect is 131 copies/mL. A negative result does not preclude SARS-Cov-2 infection and should not be used as the  sole basis for treatment or other  patient management decisions. A negative result may occur with  improper specimen collection/handling, submission of specimen other than nasopharyngeal swab, presence of viral mutation(s) within the areas targeted by this assay, and inadequate number of viral copies (<131 copies/mL). A negative result must be combined with clinical observations, patient history, and epidemiological information. The expected result is Negative. Fact Sheet for Patients:  PinkCheek.be Fact Sheet for Healthcare Providers:  GravelBags.it This test is not yet ap proved or cleared by the [place] FDA and  has been authorized for detection and/or diagnosis of SARS-CoV-2 by FDA under an Emergency Use Authorization (EUA). This EUA will remain  in effect (meaning this test can be used) for the duration of the COVID-19 declaration under Section 564(b)(1) of the Act, 21 U.S.C. section 360bbb-3(b)(1), unless the authorization is terminated or revoked sooner.    Influenza A by PCR NEGATIVE NEGATIVE Final   Influenza B by PCR NEGATIVE NEGATIVE Final    Comment: (NOTE) The Xpert Xpress SARS-CoV-2/FLU/RSV assay is intended as an aid in  the diagnosis of influenza from Nasopharyngeal swab specimens and  should not be used as a sole basis for treatment. Nasal washings and  aspirates are unacceptable for Xpert Xpress SARS-CoV-2/FLU/RSV  testing. Fact Sheet for Patients: PinkCheek.be Fact Sheet for Healthcare Providers: GravelBags.it This test is not yet approved or cleared by the [place] FDA and  has been authorized for detection and/or diagnosis of SARS-CoV-2 by  FDA under an Emergency Use Authorization (EUA). This EUA will remain  in effect (meaning this test can be used) for the duration of the  Covid-19 declaration under Section 564(b)(1) of the Act, 21  U.S.C. section 360bbb-3(b)(1), unless  the authorization is  terminated or revoked. Performed at [facility], [address], [place], [place] [address]   Urine culture     Status: None   Collection Time: 01/23/20  2:30 AM   Specimen: In/Out Cath Urine  Result Value Ref Range Status   Specimen Description   Final    IN/OUT CATH URINE Performed at [facility], [address], [place], [place] [address]    Special Requests   Final    NONE Performed at [facility], [address], [place], [place] [address]    Culture   Final    NO GROWTH Performed at [facility], [place] [address] [place], [place] [address]    Report Status 01/24/2020 FINAL  Final  MRSA PCR Screening     Status: None   Collection Time: 01/23/20  7:44 AM   Specimen: Nasal Mucosa; Nasopharyngeal  Result Value Ref Range Status   MRSA by PCR NEGATIVE NEGATIVE Final    Comment:        The GeneXpert MRSA Assay (FDA approved for NASAL specimens only), is one component of a comprehensive MRSA colonization surveillance program. It is not intended to diagnose MRSA infection nor to guide or monitor treatment for MRSA infections. Performed at [facility], [address] [place], [place] [address]   Surgical pcr screen     Status: None   Collection Time: 01/25/20  8:59 PM   Specimen: Nasal Mucosa; Nasal Swab  Result Value Ref Range Status   MRSA, PCR NEGATIVE NEGATIVE Final   Staphylococcus aureus NEGATIVE NEGATIVE Final    Comment: (NOTE) The Xpert SA Assay (FDA approved for NASAL specimens in patients 56 years of age and older), is one component of a comprehensive surveillance program. It is  not intended to diagnose infection nor to guide or monitor treatment. Performed at [facility], [place] [address], [place], [place] [address]   Aerobic/Anaerobic Culture (surgical/deep wound)     Status: None (Preliminary result)   Collection Time: 01/26/20 10:12 AM   Specimen: PATH Other; Body Fluid  Result Value Ref Range Status   Specimen  Description ABSCESS RIGHT THIGH  Final   Special Requests PATIENT ON FOLLOWING VANCOMYCIN  Final   Gram Stain   Final    FEW WBC PRESENT, PREDOMINANTLY PMN MODERATE GRAM POSITIVE COCCI IN PAIRS Performed at [facility], [address] [place], [place] [address]    Culture   Final    RARE STREPTOCOCCUS GROUP G NO ANAEROBES ISOLATED; CULTURE IN PROGRESS FOR 5 DAYS    Report Status PENDING  Incomplete         Radiology Studies: [place] EKG SITE RITE  Result Date: 01/28/2020 If Site Rite image not attached, placement could not be confirmed due to current cardiac rhythm.       Scheduled Meds: . amLODipine  2.5 mg Oral Daily  . aspirin EC  81 mg Oral Daily  . clopidogrel  75 mg Oral Q breakfast  . rosuvastatin  20 mg Oral Daily  . senna-docusate  1 tablet Oral BID  . sucralfate  1 g Oral TID WC & HS  . vitamin B-12  1,000 mcg Oral Daily   Continuous Infusions: .  ceFAZolin (ANCEF) IV 2 g (01/29/20 0539)          Aline August, MD [facility] 01/29/2020, 7:56 AM

## 2020-01-29 NOTE — Progress Notes (Signed)
  Progress Note    01/29/2020 8:29 AM 3 Days Post-Op  Subjective:  No overnight issues  Vitals:   01/28/20 2014 01/29/20 0541  BP: (!) 156/78 (!) 163/90  Pulse: 70   Resp: 20   Temp: 98.9 F (37.2 C) 99.1 F (37.3 C)  SpO2: 99% 99%    Physical Exam: Awake and alert Right thigh incision cleaning up nicely Palpable right pt tma incision cdi with staples  CBC    Component Value Date/Time   WBC 6.7 01/29/2020 0234   RBC 3.44 (L) 01/29/2020 0234   HGB 9.7 (L) 01/29/2020 0234   HGB 14.0 10/29/2019 1421   HCT 29.8 (L) 01/29/2020 0234   HCT 43.0 10/29/2019 1421   PLT 153 01/29/2020 0234   PLT 155 10/29/2019 1421   MCV 86.6 01/29/2020 0234   MCV 88 10/29/2019 1421   MCH 28.2 01/29/2020 0234   MCHC 32.6 01/29/2020 0234   RDW 14.5 01/29/2020 0234   RDW 14.1 10/29/2019 1421   LYMPHSABS 1.7 01/29/2020 0234   LYMPHSABS 1.9 10/29/2019 1421   MONOABS 0.7 01/29/2020 0234   EOSABS 0.1 01/29/2020 0234   EOSABS 0.1 10/29/2019 1421   BASOSABS 0.0 01/29/2020 0234   BASOSABS 0.0 10/29/2019 1421    BMET    Component Value Date/Time   NA 138 01/29/2020 0234   NA 142 10/29/2019 1421   K 3.5 01/29/2020 0234   CL 100 01/29/2020 0234   CO2 30 01/29/2020 0234   GLUCOSE 106 (H) 01/29/2020 0234   BUN 9 01/29/2020 0234   BUN 14 10/29/2019 1421   CREATININE 0.60 (L) 01/29/2020 0234   CREATININE 0.70 08/01/2018 1126   CALCIUM 8.8 (L) 01/29/2020 0234   GFRNONAA >60 01/29/2020 0234   GFRNONAA 97 08/01/2018 1126   GFRAA >60 01/29/2020 0234   GFRAA 112 08/01/2018 1126    INR    Component Value Date/Time   INR 1.2 01/23/2020 0118     Intake/Output Summary (Last 24 hours) at 01/29/2020 0829 Last data filed at 01/29/2020 [id number] Gross per 24 hour  Intake 200 ml  Output 3225 ml  Net -3025 ml     Assessment:  69 y.o. male is s/p:  1.  Exploration of right above-knee popliteal graft anastomosis 2.  Incision and drainage right medial thigh abscess 3.  Transmetatarsal  amputation right foot 3 Days Post-Op  Plan: picc line for abx dispo to [facility]  Kaleiyah Polsky C. Donzetta Matters, MD [facility] [place] Office: [phone] Pager: [phone]  01/29/2020 8:29 AM

## 2020-01-30 ENCOUNTER — Encounter: Payer: Self-pay | Admitting: Infectious Disease

## 2020-01-30 DIAGNOSIS — M86071 Acute hematogenous osteomyelitis, right ankle and foot: Secondary | ICD-10-CM | POA: Diagnosis not present

## 2020-01-30 DIAGNOSIS — L97509 Non-pressure chronic ulcer of other part of unspecified foot with unspecified severity: Secondary | ICD-10-CM | POA: Diagnosis not present

## 2020-01-30 DIAGNOSIS — Z720 Tobacco use: Secondary | ICD-10-CM | POA: Diagnosis not present

## 2020-01-30 DIAGNOSIS — J438 Other emphysema: Secondary | ICD-10-CM | POA: Diagnosis not present

## 2020-01-30 DIAGNOSIS — M6281 Muscle weakness (generalized): Secondary | ICD-10-CM | POA: Diagnosis not present

## 2020-01-30 DIAGNOSIS — R197 Diarrhea, unspecified: Secondary | ICD-10-CM | POA: Diagnosis not present

## 2020-01-30 DIAGNOSIS — E43 Unspecified severe protein-calorie malnutrition: Secondary | ICD-10-CM | POA: Diagnosis not present

## 2020-01-30 DIAGNOSIS — M2550 Pain in unspecified joint: Secondary | ICD-10-CM | POA: Diagnosis not present

## 2020-01-30 DIAGNOSIS — E785 Hyperlipidemia, unspecified: Secondary | ICD-10-CM | POA: Diagnosis not present

## 2020-01-30 DIAGNOSIS — J449 Chronic obstructive pulmonary disease, unspecified: Secondary | ICD-10-CM | POA: Diagnosis not present

## 2020-01-30 DIAGNOSIS — E11621 Type 2 diabetes mellitus with foot ulcer: Secondary | ICD-10-CM | POA: Diagnosis not present

## 2020-01-30 DIAGNOSIS — M79661 Pain in right lower leg: Secondary | ICD-10-CM | POA: Diagnosis not present

## 2020-01-30 DIAGNOSIS — E559 Vitamin D deficiency, unspecified: Secondary | ICD-10-CM | POA: Diagnosis not present

## 2020-01-30 DIAGNOSIS — L97119 Non-pressure chronic ulcer of right thigh with unspecified severity: Secondary | ICD-10-CM | POA: Diagnosis not present

## 2020-01-30 DIAGNOSIS — L03115 Cellulitis of right lower limb: Secondary | ICD-10-CM | POA: Diagnosis not present

## 2020-01-30 DIAGNOSIS — M86171 Other acute osteomyelitis, right ankle and foot: Secondary | ICD-10-CM | POA: Diagnosis not present

## 2020-01-30 DIAGNOSIS — L02415 Cutaneous abscess of right lower limb: Secondary | ICD-10-CM | POA: Diagnosis not present

## 2020-01-30 DIAGNOSIS — I739 Peripheral vascular disease, unspecified: Secondary | ICD-10-CM | POA: Diagnosis not present

## 2020-01-30 DIAGNOSIS — I2510 Atherosclerotic heart disease of native coronary artery without angina pectoris: Secondary | ICD-10-CM | POA: Diagnosis not present

## 2020-01-30 DIAGNOSIS — R2689 Other abnormalities of gait and mobility: Secondary | ICD-10-CM | POA: Diagnosis not present

## 2020-01-30 DIAGNOSIS — L97419 Non-pressure chronic ulcer of right heel and midfoot with unspecified severity: Secondary | ICD-10-CM | POA: Diagnosis not present

## 2020-01-30 DIAGNOSIS — Z4781 Encounter for orthopedic aftercare following surgical amputation: Secondary | ICD-10-CM | POA: Diagnosis not present

## 2020-01-30 DIAGNOSIS — Z7401 Bed confinement status: Secondary | ICD-10-CM | POA: Diagnosis not present

## 2020-01-30 DIAGNOSIS — T827XXD Infection and inflammatory reaction due to other cardiac and vascular devices, implants and grafts, subsequent encounter: Secondary | ICD-10-CM | POA: Diagnosis not present

## 2020-01-30 DIAGNOSIS — S98911A Complete traumatic amputation of right foot, level unspecified, initial encounter: Secondary | ICD-10-CM | POA: Diagnosis not present

## 2020-01-30 DIAGNOSIS — I10 Essential (primary) hypertension: Secondary | ICD-10-CM | POA: Diagnosis not present

## 2020-01-30 DIAGNOSIS — R319 Hematuria, unspecified: Secondary | ICD-10-CM | POA: Diagnosis not present

## 2020-01-30 DIAGNOSIS — A4901 Methicillin susceptible Staphylococcus aureus infection, unspecified site: Secondary | ICD-10-CM | POA: Diagnosis not present

## 2020-01-30 DIAGNOSIS — N320 Bladder-neck obstruction: Secondary | ICD-10-CM | POA: Diagnosis not present

## 2020-01-30 DIAGNOSIS — I6529 Occlusion and stenosis of unspecified carotid artery: Secondary | ICD-10-CM | POA: Diagnosis not present

## 2020-01-30 DIAGNOSIS — Z743 Need for continuous supervision: Secondary | ICD-10-CM | POA: Diagnosis not present

## 2020-01-30 DIAGNOSIS — M869 Osteomyelitis, unspecified: Secondary | ICD-10-CM | POA: Diagnosis not present

## 2020-01-30 DIAGNOSIS — M79604 Pain in right leg: Secondary | ICD-10-CM | POA: Diagnosis not present

## 2020-01-30 DIAGNOSIS — R6520 Severe sepsis without septic shock: Secondary | ICD-10-CM | POA: Diagnosis not present

## 2020-01-30 DIAGNOSIS — L853 Xerosis cutis: Secondary | ICD-10-CM | POA: Diagnosis not present

## 2020-01-30 DIAGNOSIS — S98211D Complete traumatic amputation of two or more right lesser toes, subsequent encounter: Secondary | ICD-10-CM | POA: Diagnosis not present

## 2020-01-30 DIAGNOSIS — Z66 Do not resuscitate: Secondary | ICD-10-CM | POA: Diagnosis not present

## 2020-01-30 DIAGNOSIS — R7302 Impaired glucose tolerance (oral): Secondary | ICD-10-CM | POA: Diagnosis not present

## 2020-01-30 DIAGNOSIS — Z951 Presence of aortocoronary bypass graft: Secondary | ICD-10-CM | POA: Diagnosis not present

## 2020-01-30 DIAGNOSIS — S71101D Unspecified open wound, right thigh, subsequent encounter: Secondary | ICD-10-CM | POA: Diagnosis not present

## 2020-01-30 DIAGNOSIS — I959 Hypotension, unspecified: Secondary | ICD-10-CM | POA: Diagnosis not present

## 2020-01-30 MED ORDER — SUCRALFATE 1 G PO TABS: 1.0000 g | ORAL_TABLET | Freq: Three times a day (TID) | ORAL | Status: DC

## 2020-01-30 MED ORDER — CYANOCOBALAMIN 1000 MCG PO TABS: 1000.0000 ug | ORAL_TABLET | Freq: Every day | ORAL | 0 refills | Status: DC

## 2020-01-30 MED ORDER — OXYCODONE-ACETAMINOPHEN 5-325 MG PO TABS: 1.0000 | ORAL_TABLET | Freq: Four times a day (QID) | ORAL | 0 refills | Status: DC | PRN

## 2020-01-30 MED ORDER — CEFAZOLIN IV (FOR PTA / DISCHARGE USE ONLY): 2.0000 g | Freq: Three times a day (TID) | INTRAVENOUS | 0 refills | Status: AC

## 2020-01-30 MED ORDER — BISACODYL 10 MG RE SUPP: 10.0000 mg | Freq: Every day | RECTAL | 0 refills | Status: DC | PRN

## 2020-01-30 MED ORDER — POLYETHYLENE GLYCOL 3350 17 G PO PACK: 17.0000 g | PACK | Freq: Every day | ORAL | 0 refills | Status: DC | PRN

## 2020-01-30 MED ORDER — SENNOSIDES-DOCUSATE SODIUM 8.6-50 MG PO TABS: 1.0000 | ORAL_TABLET | Freq: Two times a day (BID) | ORAL | 0 refills | Status: DC

## 2020-01-30 NOTE — Discharge Instructions (Signed)
Wet to dry saline dressing changes to right thigh wound twice daily.    Heel weight bearing only right foot with Darco shoe.

## 2020-01-30 NOTE — Progress Notes (Addendum)
  Progress Note    01/30/2020 10:28 AM 4 Days Post-Op  Subjective:  Says his dressing hasn't been changed in 2 days.  Afebrile HR 50's-70's  [id number] systolic 0000000 RA  Vitals:   01/30/20 0958 01/30/20 1000  BP: 135/81 135/81  Pulse:  77  Resp:  16  Temp:    SpO2:  95%    Physical Exam: Cardiac:  regular Lungs:  Non labored Incisions:  Right thigh wound dressing removed and is clean.  BK incision healed nicely.  TMA clean with staples in tact.  Extremities:  +palpable right PT pulse   CBC    Component Value Date/Time   WBC 6.7 01/29/2020 0234   RBC 3.44 (L) 01/29/2020 0234   HGB 9.7 (L) 01/29/2020 0234   HGB 14.0 10/29/2019 1421   HCT 29.8 (L) 01/29/2020 0234   HCT 43.0 10/29/2019 1421   PLT 153 01/29/2020 0234   PLT 155 10/29/2019 1421   MCV 86.6 01/29/2020 0234   MCV 88 10/29/2019 1421   MCH 28.2 01/29/2020 0234   MCHC 32.6 01/29/2020 0234   RDW 14.5 01/29/2020 0234   RDW 14.1 10/29/2019 1421   LYMPHSABS 1.7 01/29/2020 0234   LYMPHSABS 1.9 10/29/2019 1421   MONOABS 0.7 01/29/2020 0234   EOSABS 0.1 01/29/2020 0234   EOSABS 0.1 10/29/2019 1421   BASOSABS 0.0 01/29/2020 0234   BASOSABS 0.0 10/29/2019 1421    BMET    Component Value Date/Time   NA 138 01/29/2020 0234   NA 142 10/29/2019 1421   K 3.5 01/29/2020 0234   CL 100 01/29/2020 0234   CO2 30 01/29/2020 0234   GLUCOSE 106 (H) 01/29/2020 0234   BUN 9 01/29/2020 0234   BUN 14 10/29/2019 1421   CREATININE 0.60 (L) 01/29/2020 0234   CREATININE 0.70 08/01/2018 1126   CALCIUM 8.8 (L) 01/29/2020 0234   GFRNONAA >60 01/29/2020 0234   GFRNONAA 97 08/01/2018 1126   GFRAA >60 01/29/2020 0234   GFRAA 112 08/01/2018 1126    INR    Component Value Date/Time   INR 1.2 01/23/2020 0118     Intake/Output Summary (Last 24 hours) at 01/30/2020 1028 Last data filed at 01/29/2020 2004 Gross per 24 hour  Intake 240 ml  Output 500 ml  Net -260 ml     Assessment:  69 y.o. male is s/p:    1.Exploration of right above-knee popliteal graft anastomosis 2.Incision and drainage right medial thigh abscess 3.Transmetatarsal amputation right foot  4 Days Post-Op  Plan: -dressing changed right thigh wound-looks clean.  Continue bid wet to dry dressing changes.  TMA healing nicely with staples in tact.  Continue heel weight bearing only.   -he does have a palpable right PT pulse -will have him f/u with Dr. Donzetta Matters in 2-3 weeks. We will arrange appt.   -pt got PICC and is ok for discharge from vascular standpoint.    Leontine Locket, PA-C [facility] [phone] 01/30/2020 10:28 AM  I have seen and evaluated the patient. I agree with the PA note as documented above.  Right thigh wound changed with wet-to-dry dressing and looks healthy.  Continue wet-to-dry dressing twice daily.  TMA is healing with staples.  Has palpable right PT pulse.  Got a PICC line yesterday and antibiotics per ID.  Okay for discharge from vascular standpoint.  F/U arranged 2-3 weeks.  Marty Heck, MD [facility] [place] Office: [phone]

## 2020-01-30 NOTE — Progress Notes (Signed)
Patient ID: [name], male   DOB: 16-Jan-1951, 69 y.o.   MRN: [id number]  PROGRESS NOTE    [name]  [id number] DOB: [birth year] DOA: 01/23/2020 PCP: [name], MD   Brief Narrative:  69 year old male with history of COPD, hypertension, thrombocytopenia, status post right femoral to above-knee popliteal bypass graft and right toes amputation on 09/15/2019 by vascular surgery presented with altered mental status and severe sepsis.  Apparently, neighbor had found him on the couch, not moving, confused, incontinent of urine and feces.  Patient was found to have bleeding from the right toe amputation site, lactic acid was 4.1.  He was empirically started on vancomycin and cefepime for sepsis.  Vascular surgery was consulted.  ID was also subsequently consulted.  He underwent I&D of right medial thigh abscess along with exploration of right above knee popliteal graft anastomosis and transmetatarsal amputation of right foot on 01/26/2020 by vascular surgery  Assessment & Plan:   Sepsis: Present on admission -Probably from below.  Currently on Ancef.   Currently hemodynamically stable.  Blood and urine cultures have been negative so far. -Sepsis is resolved  Right thigh abscess Right foot osteomyelitis -Status post I&D of right thigh abscess along with transmetatarsal amputation of right foot on 01/26/2020 by vascular surgery.  Wound care as per vascular surgery recommendations.  Pain management as per vascular surgery recommendations.  ID following.  OR  Culture is growing rare Streptococcus group G.  ID recommended 6 weeks of Ancef.  Status post PICC line placement on 01/29/2020.  Peripheral vascular disease with history of femoropopliteal bypass -Outpatient follow-up with vascular surgery.  No evidence of infection on surgical exploration on 01/26/2020.  Acute metabolic encephalopathy -Most likely from above.  Resolved.  Monitor mental status.  Fall  precautions.  Hypomagnesemia -Replace.  Thrombocytopenia -Improved.  Severe vitamin D deficiency -Continue supplementation  Hematuria Chronic bladder outlet obstruction from enlarged prostate -Hematuria is chronic.  CT shows that he has renal stones. -In and out cath as needed if unable to void.  Follow-up with urology as an outpatient.  Anemia of chronic disease -Hemoglobin stable.   DVT prophylaxis: SCDs.  Lovenox/heparin on hold because of hematuria and thrombocytopenia Code Status: DNR Family Communication: None at bedside Disposition Plan: SNF once bed is available.  Patient is currently medically stable for discharge.  Consultants: Vascular surgery/ID  Procedures:  I&D of right medial thigh abscess along with exploration of right above knee popliteal graft anastomosis and transmetatarsal amputation of right foot on 01/26/2020 by vascular surgery  Antimicrobials:  Anti-infectives (From admission, onward)   Start     Dose/Rate Route Frequency Ordered Stop   01/28/20 1800  ceFAZolin (ANCEF) IVPB 1 g/50 mL premix  Status:  Discontinued     1 g 100 mL/hr over 30 Minutes Intravenous Every 8 hours 01/28/20 0908 01/28/20 1459   01/28/20 1800  ceFAZolin (ANCEF) IVPB 2g/100 mL premix     2 g 200 mL/hr over 30 Minutes Intravenous Every 8 hours 01/28/20 1459     01/26/20 1000  ceFEPIme (MAXIPIME) 2 g in sodium chloride 0.9 % 100 mL IVPB  Status:  Discontinued     2 g 200 mL/hr over 30 Minutes Intravenous To Surgery 01/26/20 0958 01/26/20 1159   01/24/20 1600  vancomycin (VANCOREADY) IVPB 750 mg/150 mL  Status:  Discontinued     750 mg 150 mL/hr over 60 Minutes Intravenous Every 12 hours 01/24/20 1056 01/28/20 0903   01/23/20 1600  vancomycin (VANCOREADY) IVPB 750 mg/150 mL  Status:  Discontinued     750 mg 150 mL/hr over 60 Minutes Intravenous Every 12 hours 01/23/20 0256 01/24/20 0800   01/23/20 1000  ceFEPIme (MAXIPIME) 2 g in sodium chloride 0.9 % 100 mL IVPB  Status:   Discontinued     2 g 200 mL/hr over 30 Minutes Intravenous Every 8 hours 01/23/20 0256 01/28/20 0903   01/23/20 0115  ceFEPIme (MAXIPIME) 2 g in sodium chloride 0.9 % 100 mL IVPB     2 g 200 mL/hr over 30 Minutes Intravenous  Once 01/23/20 0111 01/23/20 0240   01/23/20 0115  metroNIDAZOLE (FLAGYL) IVPB 500 mg     500 mg 100 mL/hr over 60 Minutes Intravenous  Once 01/23/20 0111 01/23/20 0301   01/23/20 0115  vancomycin (VANCOCIN) IVPB 1000 mg/200 mL premix     1,000 mg 200 mL/hr over 60 Minutes Intravenous  Once 01/23/20 0111 01/23/20 0342      Subjective: Patient seen and examined at bedside.  Poor historian.  Awake but still slightly confused.  No fever, vomiting or agitation reported. Objective: Vitals:   01/29/20 1007 01/29/20 1846 01/29/20 2003 01/30/20 0553  BP: 137/71 135/65 (!) 122/49 (!) 141/76  Pulse:  65 64 76  Resp:  18 18   Temp:   98.4 F (36.9 C) 98.8 F (37.1 C)  TempSrc:   Oral Oral  SpO2:  93% 95% 93%  Weight:      Height:        Intake/Output Summary (Last 24 hours) at 01/30/2020 0916 Last data filed at 01/29/2020 2004 Gross per 24 hour  Intake 240 ml  Output 960 ml  Net -720 ml   Filed Weights   01/23/20 0106 01/23/20 0600  Weight: 65.8 kg 65.8 kg    Examination:  General exam: No acute distress.  Looks chronically ill.  Looks older than stated age.  Poor historian.   Awake, slightly confused. Respiratory system: Bilateral decreased breath sounds at bases with some crackles.  No wheezing  cardiovascular system: Rate controlled, S1-S2 heard Gastrointestinal system: Abdomen is nondistended, soft and nontender.  Normal bowel sounds heard.   Extremities: No cyanosis or clubbing.  Right foot dressing and right thigh dressing present.   Data Reviewed: I have personally reviewed following labs and imaging studies  CBC: Recent Labs  Lab 01/24/20 0706 01/26/20 0250 01/27/20 0301 01/28/20 0342 01/29/20 0234  WBC 5.2 4.5 8.0 6.8 6.7  NEUTROABS 3.5   --   --  4.0 4.1  HGB 10.4* 10.9* 10.2* 9.0* 9.7*  HCT 33.1* 33.5* 31.6* 27.7* 29.8*  MCV 89.9 86.3 87.8 87.1 86.6  PLT 93* 102* 132* 125* 0000000   Basic Metabolic Panel: Recent Labs  Lab 01/24/20 0706 01/24/20 0706 01/25/20 0320 01/26/20 0250 01/27/20 0301 01/28/20 0342 01/29/20 0234  NA 138   < > 143 140 136 140 138  K 3.8   < > 3.5 3.5 4.5 3.8 3.5  CL 107   < > 106 107 105 103 100  CO2 24   < > 25 25 26 29 30   GLUCOSE 104*   < > 100* 101* 166* 116* 106*  BUN 19   < > 12 9 14 13 9   CREATININE 0.78   < > 0.72 0.71 0.86 0.63 0.60*  CALCIUM 8.1*   < > 8.3* 8.4* 8.3* 8.6* 8.8*  MG 1.4*  --   --   --   --  1.5* 1.5*   < > =  values in this interval not displayed.   GFR: Estimated Creatinine Clearance: 81.1 mL/min (A) (by C-G formula based on SCr of 0.6 mg/dL (L)). Liver Function Tests: Recent Labs  Lab 01/24/20 0706 01/25/20 0320 01/26/20 0250 01/27/20 0301 01/28/20 0342  AST 18 18 16  13* 10*  ALT 12 13 14 12 11   ALKPHOS 50 50 60 57 46  BILITOT 0.6 0.6 0.6 0.4 0.3  PROT 5.7* 5.5* 6.1* 5.6* 5.3*  ALBUMIN 2.5* 2.3* 2.6* 2.5* 2.3*   No results for input(s): LIPASE, AMYLASE in the last 168 hours. No results for input(s): AMMONIA in the last 168 hours. Coagulation Profile: No results for input(s): INR, PROTIME in the last 168 hours. Cardiac Enzymes: No results for input(s): CKTOTAL, CKMB, CKMBINDEX, TROPONINI in the last 168 hours. BNP (last 3 results) No results for input(s): PROBNP in the last 8760 hours. HbA1C: No results for input(s): HGBA1C in the last 72 hours. CBG: No results for input(s): GLUCAP in the last 168 hours. Lipid Profile: No results for input(s): CHOL, HDL, LDLCALC, TRIG, CHOLHDL, LDLDIRECT in the last 72 hours. Thyroid Function Tests: No results for input(s): TSH, T4TOTAL, FREET4, T3FREE, THYROIDAB in the last 72 hours. Anemia Panel: No results for input(s): VITAMINB12, FOLATE, FERRITIN, TIBC, IRON, RETICCTPCT in the last 72 hours. Sepsis  Labs: No results for input(s): PROCALCITON, LATICACIDVEN in the last 168 hours.  Recent Results (from the past 240 hour(s))  Blood Culture (routine x 2)     Status: None   Collection Time: 01/23/20  1:19 AM   Specimen: Right Antecubital; Blood  Result Value Ref Range Status   Specimen Description RIGHT ANTECUBITAL  Final   Special Requests   Final    BOTTLES DRAWN AEROBIC AND ANAEROBIC Blood Culture adequate volume   Culture   Final    NO GROWTH 5 DAYS Performed at [facility], [address], [place], [place] [address]    Report Status 01/28/2020 FINAL  Final  Blood Culture (routine x 2)     Status: None   Collection Time: 01/23/20  1:23 AM   Specimen: Left Antecubital; Blood  Result Value Ref Range Status   Specimen Description LEFT ANTECUBITAL  Final   Special Requests   Final    BOTTLES DRAWN AEROBIC AND ANAEROBIC Blood Culture adequate volume   Culture   Final    NO GROWTH 5 DAYS Performed at [facility], [address], [place], [place] [address]    Report Status 01/28/2020 FINAL  Final  Respiratory Panel by RT PCR (Flu A&B, Covid) - Nasopharyngeal Swab     Status: None   Collection Time: 01/23/20  2:00 AM   Specimen: Nasopharyngeal Swab  Result Value Ref Range Status   SARS Coronavirus 2 by RT PCR NEGATIVE NEGATIVE Final    Comment: (NOTE) SARS-CoV-2 target nucleic acids are NOT DETECTED. The SARS-CoV-2 RNA is generally detectable in upper respiratoy specimens during the acute phase of infection. The lowest concentration of SARS-CoV-2 viral copies this assay can detect is 131 copies/mL. A negative result does not preclude SARS-Cov-2 infection and should not be used as the sole basis for treatment or other patient management decisions. A negative result may occur with  improper specimen collection/handling, submission of specimen other than nasopharyngeal swab, presence of viral mutation(s) within the areas targeted by this assay, and inadequate number of viral  copies (<131 copies/mL). A negative result must be combined with clinical observations, patient history, and epidemiological information. The expected result is Negative. Fact Sheet  for Patients:  PinkCheek.be Fact Sheet for Healthcare Providers:  GravelBags.it This test is not yet ap proved or cleared by the [place] FDA and  has been authorized for detection and/or diagnosis of SARS-CoV-2 by FDA under an Emergency Use Authorization (EUA). This EUA will remain  in effect (meaning this test can be used) for the duration of the COVID-19 declaration under Section 564(b)(1) of the Act, 21 U.S.C. section 360bbb-3(b)(1), unless the authorization is terminated or revoked sooner.    Influenza A by PCR NEGATIVE NEGATIVE Final   Influenza B by PCR NEGATIVE NEGATIVE Final    Comment: (NOTE) The Xpert Xpress SARS-CoV-2/FLU/RSV assay is intended as an aid in  the diagnosis of influenza from Nasopharyngeal swab specimens and  should not be used as a sole basis for treatment. Nasal washings and  aspirates are unacceptable for Xpert Xpress SARS-CoV-2/FLU/RSV  testing. Fact Sheet for Patients: PinkCheek.be Fact Sheet for Healthcare Providers: GravelBags.it This test is not yet approved or cleared by the [place] FDA and  has been authorized for detection and/or diagnosis of SARS-CoV-2 by  FDA under an Emergency Use Authorization (EUA). This EUA will remain  in effect (meaning this test can be used) for the duration of the  Covid-19 declaration under Section 564(b)(1) of the Act, 21  U.S.C. section 360bbb-3(b)(1), unless the authorization is  terminated or revoked. Performed at [facility], [address], [place], [place] [address]   Urine culture     Status: None   Collection Time: 01/23/20  2:30 AM   Specimen: In/Out Cath Urine  Result Value Ref Range Status    Specimen Description   Final    IN/OUT CATH URINE Performed at [facility], [address] [place], [place] [address]    Special Requests   Final    NONE Performed at [facility], [address], [place], [place] [address]    Culture   Final    NO GROWTH Performed at [facility], [place] [address] [place], [place] [address]    Report Status 01/24/2020 FINAL  Final  MRSA PCR Screening     Status: None   Collection Time: 01/23/20  7:44 AM   Specimen: Nasal Mucosa; Nasopharyngeal  Result Value Ref Range Status   MRSA by PCR NEGATIVE NEGATIVE Final    Comment:        The GeneXpert MRSA Assay (FDA approved for NASAL specimens only), is one component of a comprehensive MRSA colonization surveillance program. It is not intended to diagnose MRSA infection nor to guide or monitor treatment for MRSA infections. Performed at [facility], [address], [place], [place] [address]   Surgical pcr screen     Status: None   Collection Time: 01/25/20  8:59 PM   Specimen: Nasal Mucosa; Nasal Swab  Result Value Ref Range Status   MRSA, PCR NEGATIVE NEGATIVE Final   Staphylococcus aureus NEGATIVE NEGATIVE Final    Comment: (NOTE) The Xpert SA Assay (FDA approved for NASAL specimens in patients 25 years of age and older), is one component of a comprehensive surveillance program. It is not intended to diagnose infection nor to guide or monitor treatment. Performed at [facility], [place] [address] [place], [place] [address]   Aerobic/Anaerobic Culture (surgical/deep wound)     Status: None (Preliminary result)   Collection Time: 01/26/20 10:12 AM   Specimen: PATH Other; Body Fluid  Result Value Ref Range Status   Specimen Description ABSCESS RIGHT THIGH  Final  Special Requests PATIENT ON FOLLOWING VANCOMYCIN  Final   Gram Stain   Final    FEW WBC PRESENT, PREDOMINANTLY PMN MODERATE GRAM POSITIVE COCCI IN PAIRS Performed at [facility], [place] [address]  [place], [place] [address]    Culture   Final    RARE STREPTOCOCCUS GROUP G NO ANAEROBES ISOLATED; CULTURE IN PROGRESS FOR 5 DAYS    Report Status PENDING  Incomplete  SARS CORONAVIRUS 2 (TAT 6-24 HRS) Nasopharyngeal Nasopharyngeal Swab     Status: None   Collection Time: 01/29/20  5:13 PM   Specimen: Nasopharyngeal Swab  Result Value Ref Range Status   SARS Coronavirus 2 NEGATIVE NEGATIVE Final    Comment: (NOTE) SARS-CoV-2 target nucleic acids are NOT DETECTED. The SARS-CoV-2 RNA is generally detectable in upper and lower respiratory specimens during the acute phase of infection. Negative results do not preclude SARS-CoV-2 infection, do not rule out co-infections with other pathogens, and should not be used as the sole basis for treatment or other patient management decisions. Negative results must be combined with clinical observations, patient history, and epidemiological information. The expected result is Negative. Fact Sheet for Patients: SugarRoll.be Fact Sheet for Healthcare Providers: https://www.woods-mathews.com/ This test is not yet approved or cleared by the [place] FDA and  has been authorized for detection and/or diagnosis of SARS-CoV-2 by FDA under an Emergency Use Authorization (EUA). This EUA will remain  in effect (meaning this test can be used) for the duration of the COVID-19 declaration under Section 56 4(b)(1) of the Act, 21 U.S.C. section 360bbb-3(b)(1), unless the authorization is terminated or revoked sooner. Performed at [facility], [place] [address] [place], [place] [address]          Radiology Studies: [place] EKG SITE RITE  Result Date: 01/28/2020 If [facility] image not attached, placement could not be confirmed due to current cardiac rhythm.       Scheduled Meds: . amLODipine  2.5 mg Oral Daily  . aspirin EC  81 mg Oral Daily  . Chlorhexidine Gluconate Cloth  6 each Topical Daily  .  clopidogrel  75 mg Oral Q breakfast  . rosuvastatin  20 mg Oral Daily  . senna-docusate  1 tablet Oral BID  . sodium chloride flush  10-40 mL Intracatheter Q12H  . sucralfate  1 g Oral TID WC & HS  . vitamin B-12  1,000 mcg Oral Daily   Continuous Infusions: .  ceFAZolin (ANCEF) IV 2 g (01/30/20 [id number])          Aline August, MD [facility] 01/30/2020, 9:16 AM

## 2020-01-30 NOTE — TOC Progression Note (Signed)
Transition of Care (TOC) - Progression Note    Patient Details  Name: Raymond Walker. MRN: [id number] Date of Birth: 04-Jul-1951  Transition of Care [facility]) CM/SW [place], [place] Phone Number: [phone] 01/30/2020, 9:11 AM  Clinical Narrative:     CSW called [facility] to verify bed readiness and had to leave a message. CSW is awaiting a call back.  CSW will continue to follow for discharge planning needs.   Expected Discharge Plan: [place] Barriers to Discharge: Continued Medical Work up  Expected Discharge Plan and Services Expected Discharge Plan: [place] arrangements for the past 2 months: Single Family Home                                       Social Determinants of Health (SDOH) Interventions    Readmission Risk Interventions No flowsheet data found.

## 2020-01-30 NOTE — Progress Notes (Signed)
Pt discharged today to [facility] (Room 127-B) via PT[facility].  Pt left with all of their personal belongings.  AVS documentation sent with [facility]AR.  Report given to Geralyn Flash RN.

## 2020-01-30 NOTE — TOC Transition Note (Signed)
Transition of Care [facility]) - CM/SW Discharge Note   Patient Details  Name: Raymond Walker. MRN: [id number] Date of Birth: 08-Dec-1951  Transition of [facility]) CM/SW Contact:  Bary Castilla, LCSW Phone Number: [phone] 01/30/2020, 11:34 AM   Clinical Narrative:    Patient will DC to: [facility]? Anticipated DC date:?01/30/20 Family notified:?[name] [id number]   Per MD patient ready for DC to [facility]. RN, patient, patient's family, and facility notified of DC. Discharge Summary sent to facility. RN given number for report [id number] room 127b . DC packet on chart. Ambulance transport requested for patient.   CSW signing off.   [place], [place] [phone]   Final next level of care: Skilled Nursing Facility Barriers to Discharge: No Barriers Identified   Patient Goals and CMS Choice     Choice offered to / list presented to : [facility] POA / [place]  Discharge Placement              Patient chooses bed at: [facility] Patient to be transferred to facility by: [place] Name of family member notified: [name] Patient and family notified of of transfer: 01/30/20  Discharge Plan and Services                                     Social Determinants of Health (SDOH) Interventions     Readmission Risk Interventions No flowsheet data found.

## 2020-01-30 NOTE — Discharge Summary (Signed)
Physician Discharge Summary  Raymond Walker. ZOX:[id number] DOB: [birth year] DOA: 01/23/2020  PCP: [name], MD  Admit date: 01/23/2020 Discharge date: 01/30/2020  Admitted From: Home Disposition: SNF  Recommendations for Outpatient Follow-up:  1. Follow up with SNF provider at earliest convenience 2. Outpatient follow-up with vascular surgery.  Wound care as per vascular surgery recommendations 3. Follow up in ED if symptoms worsen or new appear   Home Health: No Equipment/Devices: None  Discharge Condition: Stable CODE STATUS: Full Diet recommendation: Heart healthy  Brief/Interim Summary: 69 year old male with history of COPD, hypertension, thrombocytopenia, status post right femoral to above-knee popliteal bypass graft and right toes amputation on 09/15/2019 by vascular surgery presented with altered mental status and severe sepsis.  Apparently, neighbor had found him on the couch, not moving, confused, incontinent of urine and feces.  Patient was found to have bleeding from the right toe amputation site, lactic acid was 4.1.  He was empirically started on vancomycin and cefepime for sepsis.  Vascular surgery was consulted.  ID was also subsequently consulted.  He underwent I&D of right medial thigh abscess along with exploration of right above knee popliteal graft anastomosis and transmetatarsal amputation of right foot on 01/26/2020 by vascular surgery.  ID recommended 6 weeks of IV Ancef.  PICC line was placed on 01/29/2020.  PT recommended SNF placement.  He will be discharged to SNF once bed is available.  Discharge Diagnoses:   Sepsis: Present on admission -Probably from below.  Currently on Ancef.   Currently hemodynamically stable.  Blood and urine cultures have been negative so far. -Sepsis is resolved  Right thigh abscess Right foot osteomyelitis -Status post I&D of right thigh abscess along with transmetatarsal amputation of right foot on 01/26/2020 by vascular surgery.   Wound care as per vascular surgery recommendations.  Pain management as per vascular surgery recommendations.  ID following.  OR  Culture is growing rare Streptococcus group G.  ID recommended 6 weeks of iv Ancef.  Status post PICC line placement on 01/29/2020.  PT recommends SNF placement.  Outpatient follow-up with vascular surgery and ID. -Discharge to SNF once bed is available.  Peripheral vascular disease with history of femoropopliteal bypass -Outpatient follow-up with vascular surgery.  No evidence of infection on surgical exploration on 01/26/2020.  Acute metabolic encephalopathy -Most likely from above.  Resolved.  Monitor mental status.  Fall precautions.  Hypomagnesemia -Replace.  Thrombocytopenia -Improved.  Severe vitamin D deficiency -Continue supplementation  Hematuria Chronic bladder outlet obstruction from enlarged prostate -Hematuria is chronic.  CT shows that he has renal stones. -In and out cath as needed if unable to void.  Follow-up with urology as an outpatient.  Anemia of chronic disease -Hemoglobin stable.  Discharge Instructions  Discharge Instructions    Diet - low sodium heart healthy   Complete by: As directed    Home infusion instructions   Complete by: As directed    Instructions: Flushing of vascular access device: 0.9% NaCl pre/post medication administration and prn patency; Heparin 100 u/ml, 17m for implanted ports and Heparin 10u/ml, 549mfor all other central venous catheters.   Increase activity slowly   Complete by: As directed      Allergies as of 01/30/2020   No Known Allergies     Medication List    STOP taking these medications   nicotine 21 mg/24hr patch Commonly known as: NICODERM CQ - dosed in mg/24 hours   senna 8.6 MG Tabs tablet Commonly known as: [place] [employer]  TAKE these medications   acetaminophen 500 MG tablet Commonly known as: TYLENOL Take 500 mg by mouth every 8 (eight) hours as needed for moderate pain.    amLODipine 2.5 MG tablet Commonly known as: NORVASC Take 2.5 mg by mouth daily.   aspirin 81 MG EC tablet Take 1 tablet (81 mg total) by mouth daily.   bisacodyl 10 MG suppository Commonly known as: DULCOLAX Place 1 suppository (10 mg total) rectally daily as needed for severe constipation.   ceFAZolin  IVPB Commonly known as: ANCEF Inject 2 g into the vein every 8 (eight) hours. Indication:  Group G strep postsurgical wound infection Last Day of Therapy:  03/08/2020 Labs - Once weekly:  CBC/D and BMP, Labs - Every other week:  ESR and CRP   clopidogrel 75 MG tablet Commonly known as: PLAVIX Take 1 tablet (75 mg total) by mouth daily with breakfast.   cyanocobalamin 1000 MCG tablet Take 1 tablet (1,000 mcg total) by mouth daily.   multivitamin with minerals Tabs tablet Take 1 tablet by mouth daily.   oxyCODONE-acetaminophen 5-325 MG tablet Commonly known as: PERCOCET/ROXICET Take 1 tablet by mouth every 6 (six) hours as needed for moderate pain.   pantoprazole 40 MG tablet Commonly known as: PROTONIX Take 1 tablet (40 mg total) by mouth daily.   polyethylene glycol 17 g packet Commonly known as: MIRALAX / GLYCOLAX Take 17 g by mouth daily as needed. What changed:   when to take this  reasons to take this   rosuvastatin 20 MG tablet Commonly known as: CRESTOR Take 1 tablet (20 mg total) by mouth daily.   senna-docusate 8.6-50 MG tablet Commonly known as: Senokot-S Take 1 tablet by mouth 2 (two) times daily.   sildenafil 100 MG tablet Commonly known as: Viagra Take one tablet po daily prn sexual relations What changed:   how much to take  how to take this  when to take this  reasons to take this   sucralfate 1 g tablet Commonly known as: CARAFATE Take 1 tablet (1 g total) by mouth 4 (four) times daily -  with meals and at bedtime. What changed: See the new instructions.   traZODone 50 MG tablet Commonly known as: DESYREL Take 0.5-1 tablets  (25-50 mg total) by mouth at bedtime as needed for sleep.            Home Infusion Instuctions  (From admission, onward)         Start     Ordered   01/30/20 0000  Home infusion instructions    Question:  Instructions  Answer:  Flushing of vascular access device: 0.9% NaCl pre/post medication administration and prn patency; Heparin 100 u/ml, 34m for implanted ports and Heparin 10u/ml, 533mfor all other central venous catheters.   01/30/20 [id number]        Contact information for follow-up providers    VaTommy MedalCoLavell IslamMD Follow up.   Specialty: Infectious Diseases Why: 11:15 on 3/15 Contact information: [phone]        CaWaynetta SandyMD. Schedule an appointment as soon as possible for a visit in 3 weeks.   Specialties: Vascular Surgery, Cardiology Why: Office will call you to arrange your appt (sent) Contact information: [phone]      LuKathyrn DrownMD. Schedule an appointment as soon as possible for a visit in 1 week(s).   Specialty:  Family Medicine Contact information: [place] [address] [phone]            Contact information for after-discharge care    Destination    [facility] Preferred SNF .   Service: Skilled Nursing Contact information: [address] [place] [phone]                 No Known Allergies  Consultations: Vascular surgery/ID   Procedures/Studies: CT HEAD WO CONTRAST  Result Date: 01/23/2020 CLINICAL DATA:  Acute neuro deficit, stroke suspected EXAM: CT HEAD WITHOUT CONTRAST TECHNIQUE: Contiguous axial images were obtained from the base of the skull through the vertex without intravenous contrast. COMPARISON:  MR brain, 11/06/2019 FINDINGS: Brain: No evidence of acute infarction, hemorrhage, hydrocephalus, extra-axial collection or mass  lesion/mass effect.Periventricularand deep white matter hypodensity with focal hypodensity of the left caudate. Vascular: No hyperdense vessel or unexpected calcification. Skull: Normal. Negative for fracture or focal lesion. Sinuses/Orbits: No acute finding. Other: None. IMPRESSION: 1. No acute intracranial pathology. 2. 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 Electronically Signed   By: Eddie Candle M.D.   On: 01/23/2020 11:37   CT ANGIO AO+BIFEM W & OR WO CONTRAST  Result Date: 01/25/2020 CLINICAL DATA:  69 year old male with acute right lower extremity pain. Suspect infection. History of prior revascularization. EXAM: CT ANGIOGRAPHY OF ABDOMINAL AORTA WITH ILIOFEMORAL RUNOFF TECHNIQUE: Multidetector CT imaging of the abdomen, pelvis and lower extremities was performed using the standard protocol during bolus administration of intravenous contrast. Multiplanar CT image reconstructions and MIPs were obtained to evaluate the vascular anatomy. CONTRAST:  161m OMNIPAQUE IOHEXOL 350 MG/ML SOLN COMPARISON:  CT scan of the abdomen and pelvis 11/30/2019; CT scan of the chest 10/12/2016. FINDINGS: VASCULAR Aorta: Extensive heterogeneous calcified and noncalcified atherosclerotic plaque throughout the abdominal aorta. There is a small penetrating atherosclerotic ulcer arising from the posterior aspect of the visceral aorta just after the origin the celiac artery. The aorta measures a maximum of 3.1 cm at this location. Mild aneurysmal dilation of the infrarenal segment with a maximal diameter of 3.1 cm. Celiac: Patent without evidence of aneurysm, dissection, vasculitis or significant stenosis. SMA: Fibrofatty atherosclerotic plaque in the proximal SMA results in mild stenosis. No evidence of dissection or aneurysm. Renals: Solitary renal arteries bilaterally. On the right, focal fibrofatty atherosclerotic plaque results in an approximately 50% stenosis of the proximal  renal artery. The remainder of the artery is patent. No dissection. On the left, more extensive fibrofatty atherosclerotic plaque results in approximately 60% stenosis of the proximal renal artery. No evidence of dissection or aneurysm. IMA: Patent without evidence of aneurysm, dissection, vasculitis or significant stenosis. RIGHT Lower Extremity Inflow: Patent stent beginning at the aortic bifurcation extending throughout the mildly aneurysmal right common iliac artery. The stent crosses the origin of the internal iliac artery and terminates in the distal external iliac artery. The stent is widely patent without evidence of in stent stenosis. The mildly aneurysmal common iliac artery measures up to 2.2 cm in diameter. The external iliac artery remains widely patent. Outflow: The common femoral artery is widely patent. The profunda femoral artery is widely patent. The native superficial femoral artery is chronically occluded. A femoral to above the knee bypass graft is present. The graft is widely patent without evidence of thrombus. There is some inflammatory stranding surrounding the proximal aspect of the graph. As the graft dives into the abductor musculature, it  appears normal. No significant edematous changes within the muscular compartment. However, as the graft exits the muscular compartment, inflammatory changes are again noted surrounding the graft. The inflammatory changes are most significant in the most distal aspect of the graft just proximal to the popliteal anastomosis. A region of probable phlegmon measures up to 2.4 x 1.3 cm and extends medially toward the superficial subcutaneous fat. Runoff: Three-vessel runoff to the ankle. LEFT Lower Extremity Inflow: Mildly aneurysmal common iliac artery measuring up to 1.8 cm. High-grade stenosis at the origin of the internal iliac artery. The rest of the artery remains patent. Fibrofatty atherosclerotic plaque results in a moderate focal stenosis of the  proximal external iliac artery. The remainder the external iliac artery is patent. 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 No significant stenosis. The profunda femoral branches are widely patent. The native superficial femoral artery occludes just beyond the origin and remains occluded throughout the thigh before reconstituting as it exits the abductor canal. The popliteal artery is mildly diseased but remains patent. Runoff: Patent 3 vessel runoff to the ankle. Veins: No obvious venous abnormality within the limitations of this arterial phase study. Review of the MIP images confirms the above findings. NON-VASCULAR Lower chest: Compared to the prior CT scan of the chest from October 12, 2016, and CT of the abdomen and pelvis 11/30/2019, there are is a new rounded nodule in the inferior aspect of the right middle lobe which measures 7 mm (image 5 series 5). There is subtle ground-glass attenuation airspace opacity around the periphery of the nodule. Similarly, there is an irregularly shaped nodule within the anterior aspect of the right lower lobe which measures 2.1 x 1.1 cm (image 10 series 5). This region was present on the more recent prior CT scan of the abdomen and pelvis dated 11/30/2019. A third slightly irregular nodular opacity is also visualized affiliated with the most inferior aspect of the major fissure just above the diaphragm. This is also new and measures 1.7 x 1.1 cm on image 15 of series 5. Centrilobular pulmonary emphysema. There is also likely a component of pan acinar emphysematous changes. Dependent atelectasis also present in the right lower lobe. Hepatobiliary: Heterogeneous appearance of the liver with slight atrophy of the right hepatic lobe and relative hypertrophy of the caudate and left hepatic lobe. There is subtle lobularity of the liver contour and a suggestion of recanalization of a paraumbilical vein. Overall, these findings suggest underlying  hepatic cirrhosis. Additionally, there is trace perihepatic ascites. The gallbladder is surgically absent. No intra or extrahepatic biliary ductal dilatation. No discrete hepatic mass. Pancreas: Unremarkable. No pancreatic ductal dilatation or surrounding inflammatory changes. Spleen: Punctate calcifications in the spleen consistent with old granulomatous disease. The spleen remains normal in size. Adrenals/Urinary Tract: Adreniform thickening of the adrenal glands bilaterally. Bilateral circumscribed water attenuation cystic lesions in the kidneys consistent with simple renal cysts. A lesion arising from the upper pole of the left kidney demonstrates layering milk of calcium within the cyst. No evidence of enhancement or nodularity. No hydronephrosis. However, there are punctate calcifications throughout the renal pyramids bilaterally suggestive of medullary nephrocalcinosis. Additionally, there are several renal stones. On the right, the largest measures 9 mm in the lower pole collecting system. On the left, the largest measures 7 mm in the interpolar collecting system. The ureters are unremarkable. The bladder wall is mildly thickened with a trabeculated appearance. Stomach/Bowel: No focal bowel wall thickening or evidence of obstruction. Normal appendix. Lymphatic:  No suspicious intra-abdominal or retroperitoneal lymphadenopathy. Hyperenhancing and asymmetrically enlarged lymph nodes are present in the right groin in the superficial inguinal nodal station. These are almost certainly reactive. Reproductive: Prostatomegaly. Other: Circumscribed peripherally enhancing fluid collection in the subcutaneous fat of the medial mid thigh measures 2.3 x 1.7 by 5.5 cm. Skin thickening and fairly extensive reticulation of the superficial subcutaneous fat along the right thigh involving the anterior, medial and posterior aspects of the thigh extending to the level of the knee. Musculoskeletal: No acute fracture or aggressive  appearing lytic or blastic osseous lesion. IMPRESSION: VASCULAR 1. Imaging findings are concerning for potential superinfection of the right femoral to above the knee popliteal bypass graft. Phlegmonous changes are present about the proximal aspect of the graft just beyond the anastomosis, and again when the graft exits the adductor musculature in the popliteal fossa. Additionally, there is evidence of cellulitis involving entire right thigh (primarily anterior, medial and posterior) with a focal 2.3 x 1.7 x 5.5 cm fluid collection concerning for abscess in the medial mid thigh. Of note, this abscess is in the superficial adipose tissue and not adjacent to the graft. 2. The bypass graft itself remains widely patent without evidence of deterioration at the anastomoses or evidence of mural thrombus formation. 3. Widely patent right common and external iliac artery stent. 4. Abdominal aortic aneurysm with a maximal transverse diameter of 3.1 cm. Recommend followup by ultrasound in 3 years. This recommendation follows ACR consensus guidelines: White Paper of the ACR Incidental Findings Committee II on Vascular Findings. J Am Coll Radiol 2013; 10:789-794. 5. Irregular fibrofatty atherosclerotic plaque throughout the abdominal aorta with a small penetrating atherosclerotic ulcer along the posterior wall of the visceral aorta between the origins of the celiac and superior mesenteric arteries. 6. Mildly aneurysmal right common iliac artery with a maximal diameter of 2.2 cm. 7. Ectatic left common iliac artery with a maximal diameter of 1.7 cm. 8. High-grade stenosis of the origin of the left internal iliac artery. 9. Moderate stenoses of the bilateral renal arteries. 10. 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 NON-VASCULAR 1. New areas of irregular nodularity in the inferior aspect of the right middle and lower lobes compared to prior CT imaging from 11/30/2019. Given the relatively rapid development, an  infectious/inflammatory process is favored. Pneumonia, or septic emboli (particularly given the areas of infection suspected in the right thigh) are considerations. Malignancy is possible, but considered less likely. Of note, 1 of the 3 nodular areas was present on the prior CT scan and would be more concerning for an underlying neoplastic process. Recommend follow-up CT scan of the chest in 4-6 weeks following an appropriate course of therapy to confirm resolution/stability. 2. Reactive lymphadenopathy in the right superficial inguinal nodal station. 3. Prostatomegaly with probable mild bladder wall trabeculation indicating an element of bladder outlet obstruction. 4. Hepatic cirrhosis with trace ascites. 5. 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 6. Bilateral simple renal cysts at least 1 of which contains layering milk of calcium. 7. Additional ancillary findings as above. These results were called by telephone at the time of interpretation on 01/25/2020 at 9:49 am to provider Servando Snare, who verbally acknowledged these results. Signed, Criselda Peaches, MD, [facility] Electronically Signed   By: Jacqulynn Cadet M.D.   On: 01/25/2020 09:49   DG Chest Port 1 View  Result Date: 01/23/2020 CLINICAL DATA:  Change in mental status EXAM: PORTABLE CHEST  1 VIEW COMPARISON:  August 19, 2019 FINDINGS: The heart size and mediastinal contours are within normal limits. Both lungs are clear. The visualized skeletal structures are unremarkable. IMPRESSION: No active disease. Electronically Signed   By: Prudencio Pair M.D.   On: 01/23/2020 01:49   VAS [place] UPPER EXT VEIN MAPPING (PRE-OP AVF)  Result Date: 01/25/2020 UPPER EXTREMITY VEIN MAPPING  Indications: History of PAD; patient is pre-operative for bypass. Comparison Study: no prior study on file Performing Technologist: Sharion Dove RVS   Examination Guidelines: A complete evaluation includes B-mode imaging, spectral Doppler, color Doppler, and power Doppler as needed of all accessible portions of each vessel. Bilateral testing is considered an integral part of a complete examination. Limited examinations for reoccurring indications may be performed as noted. +-----------------+-------------+----------+---------+ Right Cephalic   Diameter (cm)Depth (cm)Findings  +-----------------+-------------+----------+---------+ Prox upper arm       0.30        0.18             +-----------------+-------------+----------+---------+ Mid upper arm        0.26        0.24             +-----------------+-------------+----------+---------+ Dist upper arm       0.29        0.26             +-----------------+-------------+----------+---------+ Antecubital fossa    0.33        0.23             +-----------------+-------------+----------+---------+ Prox forearm         0.34        0.31             +-----------------+-------------+----------+---------+ Mid forearm          0.29        0.33   branching +-----------------+-------------+----------+---------+ Wrist                0.32        0.25             +-----------------+-------------+----------+---------+ +-----------------+-------------+----------+---------+ Right Basilic    Diameter (cm)Depth (cm)Findings  +-----------------+-------------+----------+---------+ Prox upper arm       0.52        0.29   branching +-----------------+-------------+----------+---------+ Mid upper arm        0.51        0.47   branching +-----------------+-------------+----------+---------+ Dist upper arm       0.22        0.29             +-----------------+-------------+----------+---------+ Antecubital fossa    0.26        0.23   Thrombus  +-----------------+-------------+----------+---------+ Prox forearm         0.21        0.23   branching  +-----------------+-------------+----------+---------+ Mid forearm          0.19        0.19             +-----------------+-------------+----------+---------+ Wrist                0.16        0.23             +-----------------+-------------+----------+---------+ +-----------------+-------------+----------+--------+ Left Cephalic    Diameter (cm)Depth (cm)Findings +-----------------+-------------+----------+--------+ Prox upper arm       0.18        0.30   trhombus +-----------------+-------------+----------+--------+ Mid upper arm  0.31        0.28   thrombus +-----------------+-------------+----------+--------+ Dist upper arm       0.17        0.32            +-----------------+-------------+----------+--------+ Antecubital fossa    0.48        0.67   thrombus +-----------------+-------------+----------+--------+ Prox forearm         0.35        0.39            +-----------------+-------------+----------+--------+ Mid forearm          0.34        0.32            +-----------------+-------------+----------+--------+ Wrist                0.29        0.24            +-----------------+-------------+----------+--------+ +-----------------+-------------+----------+---------+ Left Basilic     Diameter (cm)Depth (cm)Findings  +-----------------+-------------+----------+---------+ Prox upper arm       0.46        0.37             +-----------------+-------------+----------+---------+ Mid upper arm        0.59        0.43             +-----------------+-------------+----------+---------+ Dist upper arm       0.51        0.68   branching +-----------------+-------------+----------+---------+ Antecubital fossa    0.24        0.32             +-----------------+-------------+----------+---------+ Prox forearm         0.24        0.32             +-----------------+-------------+----------+---------+ Mid forearm          0.25        0.21              +-----------------+-------------+----------+---------+ Wrist                0.23        0.30   branching +-----------------+-------------+----------+---------+ *See table(s) above for measurements and observations.  Diagnosing physician: Monica Martinez MD Electronically signed by Monica Martinez MD on 01/25/2020 at 4:15:54 PM.    Final    [place] EKG SITE RITE  Result Date: 01/28/2020 If Site Rite image not attached, placement could not be confirmed due to current cardiac rhythm.      Subjective: Patient seen and examined at bedside.  Poor historian.  Awake but still slightly confused.  No fever, vomiting or agitation reported.  Discharge Exam: Vitals:   01/30/20 0958 01/30/20 1000  BP: 135/81 135/81  Pulse:  77  Resp:  16  Temp:    SpO2:  95%    General exam: No acute distress.  Looks chronically ill.  Looks older than stated age.  Poor historian.   Awake, slightly confused. Respiratory system: Bilateral decreased breath sounds at bases with some crackles.  No wheezing  cardiovascular system: Rate controlled, S1-S2 heard Gastrointestinal system: Abdomen is nondistended, soft and nontender.  Normal bowel sounds heard.   Extremities: No cyanosis or clubbing.  Right foot dressing and right thigh dressing present.    The results of significant diagnostics from this hospitalization (including imaging, microbiology, ancillary and laboratory) are listed below for reference.     Microbiology: Recent Results (from the  past 240 hour(s))  Blood Culture (routine x 2)     Status: None   Collection Time: 01/23/20  1:19 AM   Specimen: Right Antecubital; Blood  Result Value Ref Range Status   Specimen Description RIGHT ANTECUBITAL  Final   Special Requests   Final    BOTTLES DRAWN AEROBIC AND ANAEROBIC Blood Culture adequate volume   Culture   Final    NO GROWTH 5 DAYS Performed at [facility], [address] [place], [place] [address]    Report Status 01/28/2020  FINAL  Final  Blood Culture (routine x 2)     Status: None   Collection Time: 01/23/20  1:23 AM   Specimen: Left Antecubital; Blood  Result Value Ref Range Status   Specimen Description LEFT ANTECUBITAL  Final   Special Requests   Final    BOTTLES DRAWN AEROBIC AND ANAEROBIC Blood Culture adequate volume   Culture   Final    NO GROWTH 5 DAYS Performed at [facility], [address], [place], [place] [address]    Report Status 01/28/2020 FINAL  Final  Respiratory Panel by RT PCR (Flu A&B, Covid) - Nasopharyngeal Swab     Status: None   Collection Time: 01/23/20  2:00 AM   Specimen: Nasopharyngeal Swab  Result Value Ref Range Status   SARS Coronavirus 2 by RT PCR NEGATIVE NEGATIVE Final    Comment: (NOTE) SARS-CoV-2 target nucleic acids are NOT DETECTED. The SARS-CoV-2 RNA is generally detectable in upper respiratoy specimens during the acute phase of infection. The lowest concentration of SARS-CoV-2 viral copies this assay can detect is 131 copies/mL. A negative result does not preclude SARS-Cov-2 infection and should not be used as the sole basis for treatment or other patient management decisions. A negative result may occur with  improper specimen collection/handling, submission of specimen other than nasopharyngeal swab, presence of viral mutation(s) within the areas targeted by this assay, and inadequate number of viral copies (<131 copies/mL). A negative result must be combined with clinical observations, patient history, and epidemiological information. The expected result is Negative. Fact Sheet for Patients:  PinkCheek.be Fact Sheet for Healthcare Providers:  GravelBags.it This test is not yet ap proved or cleared by the [place] FDA and  has been authorized for detection and/or diagnosis of SARS-CoV-2 by FDA under an Emergency Use Authorization (EUA). This EUA will remain  in effect (meaning this test can be  used) for the duration of the COVID-19 declaration under Section 564(b)(1) of the Act, 21 U.S.C. section 360bbb-3(b)(1), unless the authorization is terminated or revoked sooner.    Influenza A by PCR NEGATIVE NEGATIVE Final   Influenza B by PCR NEGATIVE NEGATIVE Final    Comment: (NOTE) The Xpert Xpress SARS-CoV-2/FLU/RSV assay is intended as an aid in  the diagnosis of influenza from Nasopharyngeal swab specimens and  should not be used as a sole basis for treatment. Nasal washings and  aspirates are unacceptable for Xpert Xpress SARS-CoV-2/FLU/RSV  testing. Fact Sheet for Patients: PinkCheek.be Fact Sheet for Healthcare Providers: GravelBags.it This test is not yet approved or cleared by the [place] FDA and  has been authorized for detection and/or diagnosis of SARS-CoV-2 by  FDA under an Emergency Use Authorization (EUA). This EUA will remain  in effect (meaning this test can be used) for the duration of the  Covid-19 declaration under Section 564(b)(1) of the Act, 21  U.S.C. section 360bbb-3(b)(1), unless the authorization is  terminated or revoked. Performed at [facility],  [address] [place], [place] [address]   Urine culture     Status: None   Collection Time: 01/23/20  2:30 AM   Specimen: In/Out Cath Urine  Result Value Ref Range Status   Specimen Description   Final    IN/OUT CATH URINE Performed at [facility], [address] [place], [place] [address]    Special Requests   Final    NONE Performed at [facility], [address], [place], [place] [address]    Culture   Final    NO GROWTH Performed at [facility], [place] [address], [place], [place] [address]    Report Status 01/24/2020 FINAL  Final  MRSA PCR Screening     Status: None   Collection Time: 01/23/20  7:44 AM   Specimen: Nasal Mucosa; Nasopharyngeal  Result Value Ref Range Status   MRSA by PCR NEGATIVE NEGATIVE Final     Comment:        The GeneXpert MRSA Assay (FDA approved for NASAL specimens only), is one component of a comprehensive MRSA colonization surveillance program. It is not intended to diagnose MRSA infection nor to guide or monitor treatment for MRSA infections. Performed at [facility], [address], [place], [place] [address]   Surgical pcr screen     Status: None   Collection Time: 01/25/20  8:59 PM   Specimen: Nasal Mucosa; Nasal Swab  Result Value Ref Range Status   MRSA, PCR NEGATIVE NEGATIVE Final   Staphylococcus aureus NEGATIVE NEGATIVE Final    Comment: (NOTE) The Xpert SA Assay (FDA approved for NASAL specimens in patients 62 years of age and older), is one component of a comprehensive surveillance program. It is not intended to diagnose infection nor to guide or monitor treatment. Performed at [facility], [place] [address], [place], [place] [address]   Aerobic/Anaerobic Culture (surgical/deep wound)     Status: None (Preliminary result)   Collection Time: 01/26/20 10:12 AM   Specimen: PATH Other; Body Fluid  Result Value Ref Range Status   Specimen Description ABSCESS RIGHT THIGH  Final   Special Requests PATIENT ON FOLLOWING VANCOMYCIN  Final   Gram Stain   Final    FEW WBC PRESENT, PREDOMINANTLY PMN MODERATE GRAM POSITIVE COCCI IN PAIRS Performed at [facility], [address], [place], [place] [address]    Culture   Final    RARE STREPTOCOCCUS GROUP G NO ANAEROBES ISOLATED; CULTURE IN PROGRESS FOR 5 DAYS    Report Status PENDING  Incomplete  SARS CORONAVIRUS 2 (TAT 6-24 HRS) Nasopharyngeal Nasopharyngeal Swab     Status: None   Collection Time: 01/29/20  5:13 PM   Specimen: Nasopharyngeal Swab  Result Value Ref Range Status   SARS Coronavirus 2 NEGATIVE NEGATIVE Final    Comment: (NOTE) SARS-CoV-2 target nucleic acids are NOT DETECTED. The SARS-CoV-2 RNA is generally detectable in upper and lower respiratory specimens during the acute phase  of infection. Negative results do not preclude SARS-CoV-2 infection, do not rule out co-infections with other pathogens, and should not be used as the sole basis for treatment or other patient management decisions. Negative results must be combined with clinical observations, patient history, and epidemiological information. The expected result is Negative. Fact Sheet for Patients: SugarRoll.be Fact Sheet for Healthcare Providers: https://www.woods-mathews.com/ This test is not yet approved or cleared by the [place] FDA and  has been authorized for detection and/or diagnosis of SARS-CoV-2 by FDA under an Emergency Use Authorization (  EUA). This EUA will remain  in effect (meaning this test can be used) for the duration of the COVID-19 declaration under Section 56 4(b)(1) of the Act, 21 U.S.C. section 360bbb-3(b)(1), unless the authorization is terminated or revoked sooner. Performed at [facility], [place] [address] [place], [place] [address]      Labs: BNP (last 3 results) No results for input(s): BNP in the last 8760 hours. Basic Metabolic Panel: Recent Labs  Lab 01/24/20 0706 01/24/20 0706 01/25/20 0320 01/26/20 0250 01/27/20 0301 01/28/20 0342 01/29/20 0234  NA 138   < > 143 140 136 140 138  K 3.8   < > 3.5 3.5 4.5 3.8 3.5  CL 107   < > 106 107 105 103 100  CO2 24   < > _0 GLUCOSE 104*   < > 100* 101* 166* 116* 106*  BUN 19   < > _1 CREATININE 0.78   < > 0.72 0.71 0.86 0.63 0.60*  CALCIUM 8.1*   < > 8.3* 8.4* 8.3* 8.6* 8.8*  MG 1.4*  --   --   --   --  1.5* 1.5*   < > = values in this interval not displayed.   Liver Function Tests: Recent Labs  Lab 01/24/20 0706 01/25/20 0320 01/26/20 0250 01/27/20 0301 01/28/20 0342  AST _2 13* 10*  ALT _3 ALKPHOS 50 50 60 57 46  BILITOT 0.6 0.6 0.6 0.4 0.3  PROT 5.7* 5.5* 6.1* 5.6* 5.3*  ALBUMIN 2.5* 2.3* 2.6* 2.5* 2.3*   No  results for input(s): LIPASE, AMYLASE in the last 168 hours. No results for input(s): AMMONIA in the last 168 hours. CBC: Recent Labs  Lab 01/24/20 0706 01/26/20 0250 01/27/20 0301 01/28/20 0342 01/29/20 0234  WBC 5.2 4.5 8.0 6.8 6.7  NEUTROABS 3.5  --   --  4.0 4.1  HGB 10.4* 10.9* 10.2* 9.0* 9.7*  HCT 33.1* 33.5* 31.6* 27.7* 29.8*  MCV 89.9 86.3 87.8 87.1 86.6  PLT 93* 102* 132* 125* 153   Cardiac Enzymes: No results for input(s): CKTOTAL, CKMB, CKMBINDEX, TROPONINI in the last 168 hours. BNP: Invalid input(s): POCBNP CBG: No results for input(s): GLUCAP in the last 168 hours. D-Dimer No results for input(s): DDIMER in the last 72 hours. Hgb A1c No results for input(s): HGBA1C in the last 72 hours. Lipid Profile No results for input(s): CHOL, HDL, LDLCALC, TRIG, CHOLHDL, LDLDIRECT in the last 72 hours. Thyroid function studies No results for input(s): TSH, T4TOTAL, T3FREE, THYROIDAB in the last 72 hours.  Invalid input(s): FREET3 Anemia work up No results for input(s): VITAMINB12, FOLATE, FERRITIN, TIBC, IRON, RETICCTPCT in the last 72 hours. Urinalysis    Component Value Date/Time   COLORURINE YELLOW 01/26/2020 1853   APPEARANCEUR HAZY (A) 01/26/2020 1853   LABSPEC 1.021 01/26/2020 1853   PHURINE 6.0 01/26/2020 1853   GLUCOSEU 50 (A) 01/26/2020 1853   HGBUR LARGE (A) 01/26/2020 1853   BILIRUBINUR NEGATIVE 01/26/2020 1853   BILIRUBINUR + 10/29/2019 1338   [place] 01/26/2020 1853   PROTEINUR 30 (A) 01/26/2020 1853   NITRITE NEGATIVE 01/26/2020 1853   LEUKOCYTESUR NEGATIVE 01/26/2020 1853   Sepsis Labs Invalid input(s): PROCALCITONIN,  WBC,  LACTICIDVEN Microbiology Recent Results (from the past 240 hour(s))  Blood Culture (routine x 2)     Status: None   Collection Time: 01/23/20  1:19 AM   Specimen: Right  Antecubital; Blood  Result Value Ref Range Status   Specimen Description RIGHT ANTECUBITAL  Final   Special Requests   Final    BOTTLES  DRAWN AEROBIC AND ANAEROBIC Blood Culture adequate volume   Culture   Final    NO GROWTH 5 DAYS Performed at [facility], [address] [place], [place] [address]    Report Status 01/28/2020 FINAL  Final  Blood Culture (routine x 2)     Status: None   Collection Time: 01/23/20  1:23 AM   Specimen: Left Antecubital; Blood  Result Value Ref Range Status   Specimen Description LEFT ANTECUBITAL  Final   Special Requests   Final    BOTTLES DRAWN AEROBIC AND ANAEROBIC Blood Culture adequate volume   Culture   Final    NO GROWTH 5 DAYS Performed at [facility], [address], [place], [place] [address]    Report Status 01/28/2020 FINAL  Final  Respiratory Panel by RT PCR (Flu A&B, Covid) - Nasopharyngeal Swab     Status: None   Collection Time: 01/23/20  2:00 AM   Specimen: Nasopharyngeal Swab  Result Value Ref Range Status   SARS Coronavirus 2 by RT PCR NEGATIVE NEGATIVE Final    Comment: (NOTE) SARS-CoV-2 target nucleic acids are NOT DETECTED. The SARS-CoV-2 RNA is generally detectable in upper respiratoy specimens during the acute phase of infection. The lowest concentration of SARS-CoV-2 viral copies this assay can detect is 131 copies/mL. A negative result does not preclude SARS-Cov-2 infection and should not be used as the sole basis for treatment or other patient management decisions. A negative result may occur with  improper specimen collection/handling, submission of specimen other than nasopharyngeal swab, presence of viral mutation(s) within the areas targeted by this assay, and inadequate number of viral copies (<131 copies/mL). A negative result must be combined with clinical observations, patient history, and epidemiological information. The expected result is Negative. Fact Sheet for Patients:  PinkCheek.be Fact Sheet for Healthcare Providers:  GravelBags.it This test is not yet ap proved or cleared by the  [place] FDA and  has been authorized for detection and/or diagnosis of SARS-CoV-2 by FDA under an Emergency Use Authorization (EUA). This EUA will remain  in effect (meaning this test can be used) for the duration of the COVID-19 declaration under Section 564(b)(1) of the Act, 21 U.S.C. section 360bbb-3(b)(1), unless the authorization is terminated or revoked sooner.    Influenza A by PCR NEGATIVE NEGATIVE Final   Influenza B by PCR NEGATIVE NEGATIVE Final    Comment: (NOTE) The Xpert Xpress SARS-CoV-2/FLU/RSV assay is intended as an aid in  the diagnosis of influenza from Nasopharyngeal swab specimens and  should not be used as a sole basis for treatment. Nasal washings and  aspirates are unacceptable for Xpert Xpress SARS-CoV-2/FLU/RSV  testing. Fact Sheet for Patients: PinkCheek.be Fact Sheet for Healthcare Providers: GravelBags.it This test is not yet approved or cleared by the [place] FDA and  has been authorized for detection and/or diagnosis of SARS-CoV-2 by  FDA under an Emergency Use Authorization (EUA). This EUA will remain  in effect (meaning this test can be used) for the duration of the  Covid-19 declaration under Section 564(b)(1) of the Act, 21  U.S.C. section 360bbb-3(b)(1), unless the authorization is  terminated or revoked. Performed at [facility], [address], [place], [place] [address]   Urine culture     Status: None   Collection Time: 01/23/20  2:30 AM   Specimen:  In/Out Cath Urine  Result Value Ref Range Status   Specimen Description   Final    IN/OUT CATH URINE Performed at [facility], [address] [place], [place] [address]    Special Requests   Final    NONE Performed at [facility], [address] [place], [place] [address]    Culture   Final    NO GROWTH Performed at [facility], [place] [address], [place], [place] [address]    Report Status 01/24/2020 FINAL   Final  MRSA PCR Screening     Status: None   Collection Time: 01/23/20  7:44 AM   Specimen: Nasal Mucosa; Nasopharyngeal  Result Value Ref Range Status   MRSA by PCR NEGATIVE NEGATIVE Final    Comment:        The GeneXpert MRSA Assay (FDA approved for NASAL specimens only), is one component of a comprehensive MRSA colonization surveillance program. It is not intended to diagnose MRSA infection nor to guide or monitor treatment for MRSA infections. Performed at [facility], [address] [place], [place] [address]   Surgical pcr screen     Status: None   Collection Time: 01/25/20  8:59 PM   Specimen: Nasal Mucosa; Nasal Swab  Result Value Ref Range Status   MRSA, PCR NEGATIVE NEGATIVE Final   Staphylococcus aureus NEGATIVE NEGATIVE Final    Comment: (NOTE) The Xpert SA Assay (FDA approved for NASAL specimens in patients 34 years of age and older), is one component of a comprehensive surveillance program. It is not intended to diagnose infection nor to guide or monitor treatment. Performed at [facility], [place] [address], [place], [place] [address]   Aerobic/Anaerobic Culture (surgical/deep wound)     Status: None (Preliminary result)   Collection Time: 01/26/20 10:12 AM   Specimen: PATH Other; Body Fluid  Result Value Ref Range Status   Specimen Description ABSCESS RIGHT THIGH  Final   Special Requests PATIENT ON FOLLOWING VANCOMYCIN  Final   Gram Stain   Final    FEW WBC PRESENT, PREDOMINANTLY PMN MODERATE GRAM POSITIVE COCCI IN PAIRS Performed at [facility], [address], [place], [place] [address]    Culture   Final    RARE STREPTOCOCCUS GROUP G NO ANAEROBES ISOLATED; CULTURE IN PROGRESS FOR 5 DAYS    Report Status PENDING  Incomplete  SARS CORONAVIRUS 2 (TAT 6-24 HRS) Nasopharyngeal Nasopharyngeal Swab     Status: None   Collection Time: 01/29/20  5:13 PM   Specimen: Nasopharyngeal Swab  Result Value Ref Range Status   SARS Coronavirus 2  NEGATIVE NEGATIVE Final    Comment: (NOTE) SARS-CoV-2 target nucleic acids are NOT DETECTED. The SARS-CoV-2 RNA is generally detectable in upper and lower respiratory specimens during the acute phase of infection. Negative results do not preclude SARS-CoV-2 infection, do not rule out co-infections with other pathogens, and should not be used as the sole basis for treatment or other patient management decisions. Negative results must be combined with clinical observations, patient history, and epidemiological information. The expected result is Negative. Fact Sheet for Patients: SugarRoll.be Fact Sheet for Healthcare Providers: https://www.woods-mathews.com/ This test is not yet approved or cleared by the [place] FDA and  has been authorized for detection and/or diagnosis of SARS-CoV-2 by FDA under an Emergency Use Authorization (EUA). This EUA will remain  in effect (meaning this test can be used) for the duration of the COVID-19 declaration under Section 56 4(b)(1) of the  Act, 21 U.S.C. section 360bbb-3(b)(1), unless the authorization is terminated or revoked sooner. Performed at [facility], [place] [address] [place], [place] [address]      Time coordinating discharge: 35 minutes  SIGNED:   Aline August, MD  [facility] 01/30/2020, 12:03 PM

## 2020-01-31 LAB — AEROBIC/ANAEROBIC CULTURE W GRAM STAIN (SURGICAL/DEEP WOUND)

## 2020-02-01 DIAGNOSIS — E11621 Type 2 diabetes mellitus with foot ulcer: Secondary | ICD-10-CM | POA: Diagnosis not present

## 2020-02-01 DIAGNOSIS — M86171 Other acute osteomyelitis, right ankle and foot: Secondary | ICD-10-CM | POA: Diagnosis not present

## 2020-02-01 DIAGNOSIS — S71101D Unspecified open wound, right thigh, subsequent encounter: Secondary | ICD-10-CM | POA: Diagnosis not present

## 2020-02-01 DIAGNOSIS — I739 Peripheral vascular disease, unspecified: Secondary | ICD-10-CM | POA: Diagnosis not present

## 2020-02-01 DIAGNOSIS — A4901 Methicillin susceptible Staphylococcus aureus infection, unspecified site: Secondary | ICD-10-CM | POA: Diagnosis not present

## 2020-02-02 DIAGNOSIS — M869 Osteomyelitis, unspecified: Secondary | ICD-10-CM | POA: Diagnosis not present

## 2020-02-02 DIAGNOSIS — I10 Essential (primary) hypertension: Secondary | ICD-10-CM | POA: Diagnosis not present

## 2020-02-02 DIAGNOSIS — I739 Peripheral vascular disease, unspecified: Secondary | ICD-10-CM | POA: Diagnosis not present

## 2020-02-02 DIAGNOSIS — Z720 Tobacco use: Secondary | ICD-10-CM | POA: Diagnosis not present

## 2020-02-02 DIAGNOSIS — S98911A Complete traumatic amputation of right foot, level unspecified, initial encounter: Secondary | ICD-10-CM | POA: Diagnosis not present

## 2020-02-04 DIAGNOSIS — L97119 Non-pressure chronic ulcer of right thigh with unspecified severity: Secondary | ICD-10-CM | POA: Diagnosis not present

## 2020-02-11 DIAGNOSIS — L97119 Non-pressure chronic ulcer of right thigh with unspecified severity: Secondary | ICD-10-CM | POA: Diagnosis not present

## 2020-02-16 DIAGNOSIS — M86171 Other acute osteomyelitis, right ankle and foot: Secondary | ICD-10-CM | POA: Diagnosis not present

## 2020-02-16 DIAGNOSIS — L853 Xerosis cutis: Secondary | ICD-10-CM | POA: Diagnosis not present

## 2020-02-16 DIAGNOSIS — S71101D Unspecified open wound, right thigh, subsequent encounter: Secondary | ICD-10-CM | POA: Diagnosis not present

## 2020-02-16 DIAGNOSIS — M79604 Pain in right leg: Secondary | ICD-10-CM | POA: Diagnosis not present

## 2020-02-18 DIAGNOSIS — L97419 Non-pressure chronic ulcer of right heel and midfoot with unspecified severity: Secondary | ICD-10-CM | POA: Diagnosis not present

## 2020-02-19 ENCOUNTER — Ambulatory Visit (INDEPENDENT_AMBULATORY_CARE_PROVIDER_SITE_OTHER): Payer: Self-pay | Admitting: Vascular Surgery

## 2020-02-19 ENCOUNTER — Encounter: Payer: Self-pay | Admitting: Vascular Surgery

## 2020-02-19 VITALS — BP 148/75 | HR 82 | Temp 98.1°F | Resp 20 | Ht 70.0 in | Wt 141.0 lb

## 2020-02-19 DIAGNOSIS — I739 Peripheral vascular disease, unspecified: Secondary | ICD-10-CM

## 2020-02-19 DIAGNOSIS — E11621 Type 2 diabetes mellitus with foot ulcer: Secondary | ICD-10-CM | POA: Diagnosis not present

## 2020-02-19 DIAGNOSIS — M6281 Muscle weakness (generalized): Secondary | ICD-10-CM | POA: Diagnosis not present

## 2020-02-19 DIAGNOSIS — T8149XA Infection following a procedure, other surgical site, initial encounter: Secondary | ICD-10-CM

## 2020-02-19 DIAGNOSIS — L97119 Non-pressure chronic ulcer of right thigh with unspecified severity: Secondary | ICD-10-CM | POA: Diagnosis not present

## 2020-02-19 DIAGNOSIS — R197 Diarrhea, unspecified: Secondary | ICD-10-CM | POA: Diagnosis not present

## 2020-02-19 DIAGNOSIS — M79661 Pain in right lower leg: Secondary | ICD-10-CM | POA: Diagnosis not present

## 2020-02-19 NOTE — Progress Notes (Signed)
    Subjective:     Patient ID: Raymond Radon., male   DOB: 06/19/1951, 69 y.o.   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  He continues on antibiotics.  He is doing well with wet-to-dry's in the right thigh.  Right above-knee incision is healed.   Review of Systems No complaints today    Objective:   Physical Exam Vitals:   02/19/20 1518  BP: (!) 148/75  Pulse: 82  Resp: 20  Temp: 98.1 F (36.7 C)  SpO2: 97%   Awake alert oriented Respirations are nonlabored Right groin and above-knee incisions have healed Right mid thigh incision clean dry intact healing.    Assessment/plan     69 year old male status post bypass right lower extremity subsequent infection in his saphenectomy site did not communicate to his graft.  Underwent completion TMA.  Staples out today.  Continue wet-to-dry dressings and antibiotics per PICC line.  We will follow-up in 3 months with right lower extremity duplex and ABIs.     Azazel Franze C. Donzetta Matters, MD [facility] Office: [phone] Pager: [phone]

## 2020-02-22 ENCOUNTER — Other Ambulatory Visit: Payer: Self-pay | Admitting: *Deleted

## 2020-02-22 DIAGNOSIS — I739 Peripheral vascular disease, unspecified: Secondary | ICD-10-CM

## 2020-02-26 DIAGNOSIS — L97119 Non-pressure chronic ulcer of right thigh with unspecified severity: Secondary | ICD-10-CM | POA: Diagnosis not present

## 2020-02-29 ENCOUNTER — Telehealth: Payer: Self-pay

## 2020-02-29 ENCOUNTER — Encounter: Payer: Self-pay | Admitting: Infectious Disease

## 2020-02-29 ENCOUNTER — Ambulatory Visit (INDEPENDENT_AMBULATORY_CARE_PROVIDER_SITE_OTHER): Payer: Medicare Other | Admitting: Infectious Disease

## 2020-02-29 ENCOUNTER — Other Ambulatory Visit: Payer: Self-pay

## 2020-02-29 VITALS — BP 146/80 | HR 87 | Temp 97.7°F | Wt 144.2 lb

## 2020-02-29 DIAGNOSIS — Z66 Do not resuscitate: Secondary | ICD-10-CM

## 2020-02-29 DIAGNOSIS — T827XXD Infection and inflammatory reaction due to other cardiac and vascular devices, implants and grafts, subsequent encounter: Secondary | ICD-10-CM

## 2020-02-29 DIAGNOSIS — M869 Osteomyelitis, unspecified: Secondary | ICD-10-CM | POA: Diagnosis not present

## 2020-02-29 DIAGNOSIS — E1169 Type 2 diabetes mellitus with other specified complication: Secondary | ICD-10-CM

## 2020-02-29 DIAGNOSIS — A4901 Methicillin susceptible Staphylococcus aureus infection, unspecified site: Secondary | ICD-10-CM | POA: Diagnosis not present

## 2020-02-29 DIAGNOSIS — F0280 Dementia in other diseases classified elsewhere without behavioral disturbance: Secondary | ICD-10-CM

## 2020-02-29 DIAGNOSIS — M86171 Other acute osteomyelitis, right ankle and foot: Secondary | ICD-10-CM | POA: Diagnosis not present

## 2020-02-29 DIAGNOSIS — J438 Other emphysema: Secondary | ICD-10-CM

## 2020-02-29 DIAGNOSIS — E11621 Type 2 diabetes mellitus with foot ulcer: Secondary | ICD-10-CM

## 2020-02-29 DIAGNOSIS — L853 Xerosis cutis: Secondary | ICD-10-CM | POA: Diagnosis not present

## 2020-02-29 DIAGNOSIS — L97419 Non-pressure chronic ulcer of right heel and midfoot with unspecified severity: Secondary | ICD-10-CM | POA: Diagnosis not present

## 2020-02-29 DIAGNOSIS — L97119 Non-pressure chronic ulcer of right thigh with unspecified severity: Secondary | ICD-10-CM | POA: Diagnosis not present

## 2020-02-29 DIAGNOSIS — L97509 Non-pressure chronic ulcer of other part of unspecified foot with unspecified severity: Secondary | ICD-10-CM

## 2020-02-29 DIAGNOSIS — G309 Alzheimer's disease, unspecified: Secondary | ICD-10-CM

## 2020-02-29 MED ORDER — CEPHALEXIN 500 MG PO CAPS: 500.0000 mg | ORAL_CAPSULE | Freq: Four times a day (QID) | ORAL | 11 refills | Status: DC

## 2020-02-29 NOTE — Patient Instructions (Addendum)
I want you to start oral cephalexin 500mg  four times daily AFTER you finish your IV antibiotics on the 25th of March

## 2020-02-29 NOTE — Progress Notes (Signed)
Subjective:   Follow-up for possible graft infection   Patient ID: Raymond Walker., male    DOB: [birth year], 69 y.o.   MRN: [id number]  HPI   Izaha Shughart. is a 69 y.o. male former smoker with peripheral vascular disease who developed gangrene of his toes and was admitted to the hospital in September with methicillin sensitive Staph aureus bacteremia.  He underwent amputation and then also a right common femoral to above-knee popliteal artery bypass PTFE graft.  He had a 2D echocardiogram that was negative for endocarditis but did not undergo transesophageal echocardiogram.  He was then admitted in February after being found down.  He underwent a CT angiogram which showed phlegmonous changes around the proximal aspect of the graft beyond the anastomosis.  There is also 2.3 x 1.7 x 5.5 cm abscess in the medial thigh.  Imaging of the chest had shown also multiple nodules concerning for possible malignancy versus septic embolism.  He was taken to the operating room today and underwent portion of his right above-the-knee popliteal graft anastomosis.  Graft itself did not appear overtly infected.  There was an area at the saphenous vein graft site that was opened and found to be grossly purulent.  This was sent for culture.  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  We were consulted by vascular surgery due to concerns that his graft itself might be infected and that he might need long-term suppressive antibiotics  He is approaching the 6-week of his IV cefazolin and this will be followed by oral Keflex.  He still appears to lack a great deal of insight.  He is residing at skilled nursing facility.  Past Medical History:  Diagnosis Date  . COPD (chronic obstructive pulmonary disease) ([place])   . Erectile dysfunction   . History of kidney stones   . Hypertension   . Impaired glucose tolerance   . Pancreatitis, recurrent   . Thrombocytopenia ([place]) 08/10/2018    Staying in the low 100s will follow closely    Past Surgical History:  Procedure Laterality Date  . ABDOMINAL AORTOGRAM W/LOWER EXTREMITY Bilateral 08/20/2019   Procedure: ABDOMINAL AORTOGRAM W/LOWER EXTREMITY;  Surgeon: Marty Heck, MD;  Location: [place] [facility];  Service: Cardiovascular;  Laterality: Bilateral;  . AMPUTATION Right 09/15/2019   Procedure: AMPUTATION RIGHT TOES One, Two, And Three;  Surgeon: Waynetta Sandy, MD;  Location: [place];  Service: Vascular;  Laterality: Right;  . CATARACT EXTRACTION W/PHACO Right 05/02/2018   Procedure: CATARACT EXTRACTION PHACO AND INTRAOCULAR LENS PLACEMENT (IOC);  Surgeon: Baruch Goldmann, MD;  Location: [facility];  Service: Ophthalmology;  Laterality: Right;  CDE: 11.11  . CATARACT EXTRACTION W/PHACO Left 07/04/2018   Procedure: CATARACT EXTRACTION PHACO AND INTRAOCULAR LENS PLACEMENT (IOC);  Surgeon: Baruch Goldmann, MD;  Location: [facility];  Service: Ophthalmology;  Laterality: Left;  CDE: 7.15  . CHOLECYSTECTOMY    . FEMORAL-POPLITEAL BYPASS GRAFT Right 09/15/2019   Procedure: Right BYPASS GRAFT FEMORAL to Above Knee POPLITEAL ARTERY;  Surgeon: Waynetta Sandy, MD;  Location: [facility];  Service: Vascular;  Laterality: Right;  . FEMORAL-POPLITEAL BYPASS GRAFT Right 01/26/2020   Procedure: IRRIGATION AND DEBRIDEMENT RIGHT FEMORAL POPLITEAL BYPASS SITE;  Surgeon: Waynetta Sandy, MD;  Location: [facility];  Service: Vascular;  Laterality: Right;  . ORCHIECTOMY    . PERIPHERAL VASCULAR INTERVENTION Left 08/20/2019   Procedure: PERIPHERAL VASCULAR INTERVENTION;  Surgeon: Marty Heck, MD;  Location: [facility] [facility];  Service: Cardiovascular;  Laterality: Left;  common/external iliac  . 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   Procedure: TRANSMETATARSAL AMPUTATION;  Surgeon: Waynetta Sandy, MD;  Location: [facility];  Service: Vascular;  Laterality: Right;  Marland Kitchen VASECTOMY      Family History  Problem Relation  Age of Onset  . Hypertension Father   . Coronary artery disease Father   . Coronary artery disease Mother   . Cancer Mother        Breast      Social History   Socioeconomic History  . Marital status: Legally Separated    Spouse name: Not on file  . Number of children: Not on file  . Years of education: Not on file  . Highest education level: Not on file  Occupational History  . Occupation: Art gallery manager: [employer]  Tobacco Use  . Smoking status: Current Every Day Smoker    Packs/day: 1.50    Types: Cigarettes  . Smokeless tobacco: Never Used  . Tobacco comment: stopped on admission to hospital on 08/19/19  Substance and Sexual Activity  . Alcohol use: No  . Drug use: No  . Sexual activity: Not on file  Other Topics Concern  . Not on file  Social History Narrative  . Not on file   Social Determinants of Health   Financial Resource Strain:   . Difficulty of Paying Living Expenses:   Food Insecurity:   . Worried About Charity fundraiser in the Last Year:   . Arboriculturist in the Last Year:   Transportation Needs:   . Film/video editor (Medical):   Marland Kitchen Lack of Transportation (Non-Medical):   Physical Activity:   . Days of Exercise per Week:   . Minutes of Exercise per Session:   Stress:   . Feeling of Stress :   Social Connections:   . Frequency of Communication with Friends and Family:   . Frequency of Social Gatherings with Friends and Family:   . Attends Religious Services:   . Active Member of Clubs or Organizations:   . Attends Archivist Meetings:   Marland Kitchen Marital Status:     No Known Allergies   Current Outpatient Medications:  .  acetaminophen (TYLENOL) 500 MG tablet, Take 500 mg by mouth every 8 (eight) hours as needed for moderate pain. , Disp: , Rfl:  .  amLODipine (NORVASC) 2.5 MG tablet, Take 2.5 mg by mouth daily., Disp: , Rfl:  .  aspirin EC 81 MG EC tablet, Take 1 tablet (81 mg total) by mouth daily.,  Disp: 30 tablet, Rfl: 0 .  bisacodyl (DULCOLAX) 10 MG suppository, Place 1 suppository (10 mg total) rectally daily as needed for severe constipation., Disp: 12 suppository, Rfl: 0 .  ceFAZolin (ANCEF) IVPB, Inject 2 g into the vein every 8 (eight) hours. Indication:  Group G strep postsurgical wound infection Last Day of Therapy:  03/08/2020 Labs - Once weekly:  CBC/D and BMP, Labs - Every other week:  ESR and CRP, Disp: 117 Units, Rfl: 0 .  clopidogrel (PLAVIX) 75 MG tablet, Take 1 tablet (75 mg total) by mouth daily with breakfast., Disp: 30 tablet, Rfl: 5 .  Multiple Vitamin (MULTIVITAMIN WITH MINERALS) TABS tablet, Take 1 tablet by mouth daily., Disp: 30 tablet, Rfl: 0 .  oxyCODONE-acetaminophen (PERCOCET/ROXICET) 5-325 MG tablet, Take 1 tablet by mouth every 6 (six) hours as needed for moderate pain., Disp: 14 tablet, Rfl: 0 .  pantoprazole (  PROTONIX) 40 MG tablet, Take 1 tablet (40 mg total) by mouth daily., Disp: 30 tablet, Rfl: 3 .  polyethylene glycol (MIRALAX / GLYCOLAX) 17 g packet, Take 17 g by mouth daily as needed., Disp: 14 each, Rfl: 0 .  rosuvastatin (CRESTOR) 20 MG tablet, Take 1 tablet (20 mg total) by mouth daily., Disp: 30 tablet, Rfl: 5 .  senna-docusate (SENOKOT-S) 8.6-50 MG tablet, Take 1 tablet by mouth 2 (two) times daily., Disp: 30 tablet, Rfl: 0 .  sildenafil (VIAGRA) 100 MG tablet, Take one tablet po daily prn sexual relations (Patient taking differently: Take 100 mg by mouth daily as needed. Take one tablet po daily prn sexual relations), Disp: 10 tablet, Rfl: 4 .  sucralfate (CARAFATE) 1 g tablet, Take 1 tablet (1 g total) by mouth 4 (four) times daily -  with meals and at bedtime., Disp: , Rfl:  .  traZODone (DESYREL) 50 MG tablet, Take 0.5-1 tablets (25-50 mg total) by mouth at bedtime as needed for sleep., Disp: 30 tablet, Rfl: 3 .  vitamin B-12 1000 MCG tablet, Take 1 tablet (1,000 mcg total) by mouth daily., Disp: 30 tablet, Rfl: 0   Review of Systems  Unable to  perform ROS: Dementia       Objective:   Physical Exam Constitutional:      General: He is not in acute distress.    Appearance: Normal appearance. He is well-developed. He is not ill-appearing or diaphoretic.  HENT:     Head: Normocephalic and atraumatic.     Right Ear: Hearing and external ear normal.     Left Ear: Hearing and external ear normal.     Nose: No nasal deformity or rhinorrhea.  Eyes:     General: No scleral icterus.    Conjunctiva/sclera: Conjunctivae normal.     Right eye: Right conjunctiva is not injected.     Left eye: Left conjunctiva is not injected.     Pupils: Pupils are equal, round, and reactive to light.  Neck:     Vascular: No JVD.  Cardiovascular:     Rate and Rhythm: Normal rate and regular rhythm.     Heart sounds: Normal heart sounds, S1 normal and S2 normal. No murmur. No friction rub.  Abdominal:     General: Bowel sounds are normal. There is no distension.     Palpations: Abdomen is soft.     Tenderness: There is no abdominal tenderness.  Musculoskeletal:        General: Normal range of motion.     Right shoulder: Normal.     Left shoulder: Normal.     Cervical back: Normal range of motion and neck supple.     Right hip: Normal.     Left hip: Normal.     Right knee: Normal.     Left knee: Normal.  Lymphadenopathy:     Head:     Right side of head: No submandibular, preauricular or posterior auricular adenopathy.     Left side of head: No submandibular, preauricular or posterior auricular adenopathy.     Cervical: No cervical adenopathy.     Right cervical: No superficial or deep cervical adenopathy.    Left cervical: No superficial or deep cervical adenopathy.  Skin:    General: Skin is warm and dry.     Coloration: Skin is not pale.     Findings: No abrasion, bruising, ecchymosis, erythema, lesion or rash.     Nails: There is no clubbing.  Neurological:  General: No focal deficit present.     Mental Status: He is alert and  oriented to person, place, and time.     Sensory: No sensory deficit.     Coordination: Coordination normal.     Gait: Gait normal.  Psychiatric:        Attention and Perception: He is attentive.        Speech: Speech normal.        Behavior: Behavior normal. Behavior is cooperative.        Cognition and Memory: Memory is impaired.        Judgment: Judgment normal.           Assessment & Plan:   Concern for infected graft site: Complete cefazolin and then placed him on Keflex 500 mg 4 times daily which we will likely continue indefinitely.  See me back in about 3 months time.  Transtibial amputation site: Followed closely by vascular surgery  Dementia: It seems to me that his multiple hospitalizations may have resulted in significant cognitive decline.

## 2020-02-29 NOTE — Telephone Encounter (Signed)
[place] and requested to speak with a nurse regarding labs. Facility sent wrong patient labs to office with patient. Nurse will fax correct labs to office. [place]

## 2020-03-03 DIAGNOSIS — E11621 Type 2 diabetes mellitus with foot ulcer: Secondary | ICD-10-CM | POA: Diagnosis not present

## 2020-03-03 DIAGNOSIS — M79661 Pain in right lower leg: Secondary | ICD-10-CM | POA: Diagnosis not present

## 2020-03-03 DIAGNOSIS — M86171 Other acute osteomyelitis, right ankle and foot: Secondary | ICD-10-CM | POA: Diagnosis not present

## 2020-03-03 DIAGNOSIS — L97119 Non-pressure chronic ulcer of right thigh with unspecified severity: Secondary | ICD-10-CM | POA: Diagnosis not present

## 2020-03-09 DIAGNOSIS — L97419 Non-pressure chronic ulcer of right heel and midfoot with unspecified severity: Secondary | ICD-10-CM | POA: Diagnosis not present

## 2020-03-09 DIAGNOSIS — I10 Essential (primary) hypertension: Secondary | ICD-10-CM | POA: Diagnosis not present

## 2020-03-09 DIAGNOSIS — E43 Unspecified severe protein-calorie malnutrition: Secondary | ICD-10-CM | POA: Diagnosis not present

## 2020-03-09 DIAGNOSIS — L97119 Non-pressure chronic ulcer of right thigh with unspecified severity: Secondary | ICD-10-CM | POA: Diagnosis not present

## 2020-03-09 DIAGNOSIS — M86171 Other acute osteomyelitis, right ankle and foot: Secondary | ICD-10-CM | POA: Diagnosis not present

## 2020-03-10 DIAGNOSIS — L97119 Non-pressure chronic ulcer of right thigh with unspecified severity: Secondary | ICD-10-CM | POA: Diagnosis not present

## 2020-03-17 ENCOUNTER — Other Ambulatory Visit: Payer: Self-pay | Admitting: *Deleted

## 2020-03-17 NOTE — Patient Outreach (Signed)
[facility]) Care Management  03/17/2020  Raymond Walker. Jun 26, 1951 [id number]   RED ON EMMI ALERT - General Discharge Day # 1 Date: 3/31 Red Alert Reason: Unknown new prescriptions   Outreach attempt #1, successful to listed guardian, [name].  Member's identity verified.  This care manager introduced self and stated purpose of call.  [facility] care management services explained.    Social: Member was living alone until he was recently admitted to hospital and required SNF for rehab.  He is now living with his guardian but they are hoping to have him move into independent living of assisted living, depending on their options.  She report member is able to care for himself independently, including performing wound care and medication management.  She will continue to be available for support.    Inquired about member having home health post discharge, she denies being contacted by agency but state she feel member would benefit from services.  Conditions: Per chart, has history of PAD resulting in femoral popliteal bypass graft and amputation of the toes on the right foot.  He later developed an infection at the graft site requiring I&D.  Also has COPD, Osteomyelitis, and thrombocytopenia.    Medications: Reviewed with guardian. She report that he is able to manage his own medications without problems.  Denies any financial difficulties paying for them.  Confirms that he has now switched to newly prescribed PO antibiotics instead of IV.  Appointments: Follow up with PCP on 4/6 and with ID on 5/17.  Caregiver will provide transportation however she state that member is able to drive himself.  His care is in need of some work but once this is done he will be providing his own transportation.  Advance Directives: Member's guardian is also his POA.   Plan: RN CM will place referral to social worker for community resources. RNCM will contact MD office to place referral for home  health. RNCM will follow up with member/caregiver within the next week.  Fall Risk  03/17/2020 11/23/2019 07/31/2018  Falls in the past year? 0 0 No  Number falls in past yr: 0 - -  Injury with Fall? 0 - -  Follow up Falls prevention discussed Falls evaluation completed -   Depression screen [facility] 2/9 07/31/2018  Decreased Interest 0  Down, Depressed, Hopeless 0  PHQ - 2 Score 0  Altered sleeping 2  Tired, decreased energy 2  Change in appetite 0  Feeling bad or failure about yourself  3  Trouble concentrating 2  Moving slowly or fidgety/restless 1  Suicidal thoughts 0  PHQ-9 Score 10  Difficult doing work/chores Not difficult at all   [facility] CM Care Plan Problem One     Most Recent Value  Care Plan Problem One  Risk for readmission related to infection as evidenced by recent hospitalization requiring SNF/rehab stay  Role Documenting the Problem One  Care Management [place] for Problem One  Active  [facility] Long Term Goal   Member will not be readmitted to acute care facility within the nexft 31 days  [facility] Long Term Goal Start Date  03/17/20  Interventions for Problem One Long Term Goal  Discharge instructions reviewed with caregiver.  Referral placed to LSW to follow up on placement concerns for additional support.  [facility] CM Short Term Goal #1   Member will have plan for home health therapy within the next 2 weeks  [facility] CM Short Term Goal #1 Start Date  03/17/20  Interventions for Short Term Goal #1  Contact made with PCP to request order for PT/OT  [facility] CM Short Term Goal #2   Caregiver and member will report compliance with medications over the next 3 weeks  [facility] CM Short Term Goal #2 Start Date  03/17/20  Interventions for Short Term Goal #2  Medications reviewed with caregiver.  Educated on importance of taking them, particulary antibiotic therapy     [name], Therapist, sports, MSN [place] [phone]

## 2020-03-18 ENCOUNTER — Encounter: Payer: Self-pay | Admitting: *Deleted

## 2020-03-22 ENCOUNTER — Telehealth: Payer: Self-pay | Admitting: Family Medicine

## 2020-03-22 ENCOUNTER — Encounter: Payer: Self-pay | Admitting: Family Medicine

## 2020-03-22 ENCOUNTER — Other Ambulatory Visit: Payer: Self-pay

## 2020-03-22 ENCOUNTER — Ambulatory Visit (INDEPENDENT_AMBULATORY_CARE_PROVIDER_SITE_OTHER): Payer: Medicare Other | Admitting: Family Medicine

## 2020-03-22 VITALS — BP 124/82 | Temp 97.9°F | Wt 148.2 lb

## 2020-03-22 DIAGNOSIS — J449 Chronic obstructive pulmonary disease, unspecified: Secondary | ICD-10-CM | POA: Diagnosis not present

## 2020-03-22 DIAGNOSIS — D509 Iron deficiency anemia, unspecified: Secondary | ICD-10-CM | POA: Diagnosis not present

## 2020-03-22 DIAGNOSIS — D519 Vitamin B12 deficiency anemia, unspecified: Secondary | ICD-10-CM

## 2020-03-22 DIAGNOSIS — Z122 Encounter for screening for malignant neoplasm of respiratory organs: Secondary | ICD-10-CM

## 2020-03-22 DIAGNOSIS — M79604 Pain in right leg: Secondary | ICD-10-CM | POA: Diagnosis not present

## 2020-03-22 DIAGNOSIS — R319 Hematuria, unspecified: Secondary | ICD-10-CM

## 2020-03-22 NOTE — Patient Instructions (Signed)
Steps to Quit Smoking Smoking tobacco is the leading cause of preventable death. It can affect almost every organ in the body. Smoking puts you and people around you at risk for many serious, long-lasting (chronic) diseases. Quitting smoking can be hard, but it is one of the best things that you can do for your health. It is never too late to quit. How do I get ready to quit? When you decide to quit smoking, make a plan to help you succeed. Before you quit:  Pick a date to quit. Set a date within the next 2 weeks to give you time to prepare.  Write down the reasons why you are quitting. Keep this list in places where you will see it often.  Tell your family, friends, and co-workers that you are quitting. Their support is important.  Talk with your doctor about the choices that may help you quit.  Find out if your health insurance will pay for these treatments.  Know the people, places, things, and activities that make you want to smoke (triggers). Avoid them. What first steps can I take to quit smoking?  Throw away all cigarettes at home, at work, and in your car.  Throw away the things that you use when you smoke, such as ashtrays and lighters.  Clean your car. Make sure to empty the ashtray.  Clean your home, including curtains and carpets. What can I do to help me quit smoking? Talk with your doctor about taking medicines and seeing a counselor at the same time. You are more likely to succeed when you do both.  If you are pregnant or breastfeeding, talk with your doctor about counseling or other ways to quit smoking. Do not take medicine to help you quit smoking unless your doctor tells you to do so. To quit smoking: Quit right away  Quit smoking totally, instead of slowly cutting back on how much you smoke over a period of time.  Go to counseling. You are more likely to quit if you go to counseling sessions regularly. Take medicine You may take medicines to help you quit. Some  medicines need a prescription, and some you can buy over-the-counter. Some medicines may contain a drug called nicotine to replace the nicotine in cigarettes. Medicines may:  Help you to stop having the desire to smoke (cravings).  Help to stop the problems that come when you stop smoking (withdrawal symptoms). Your doctor may ask you to use:  Nicotine patches, gum, or lozenges.  Nicotine inhalers or sprays.  Non-nicotine medicine that is taken by mouth. Find resources Find resources and other ways to help you quit smoking and remain smoke-free after you quit. These resources are most helpful when you use them often. They include:  Online chats with a counselor.  Phone quitlines.  Printed self-help materials.  Support groups or group counseling.  Text messaging programs.  Mobile phone apps. Use apps on your mobile phone or tablet that can help you stick to your quit plan. There are many free apps for mobile phones and tablets as well as websites. Examples include Quit Guide from the CDC and smokefree.gov  What things can I do to make it easier to quit?   Talk to your family and friends. Ask them to support and encourage you.  Call a phone quitline (1-800-QUIT-NOW), reach out to support groups, or work with a counselor.  Ask people who smoke to not smoke around you.  Avoid places that make you want to smoke,   such as: ? Bars. ? Parties. ? Smoke-break areas at work.  Spend time with people who do not smoke.  Lower the stress in your life. Stress can make you want to smoke. Try these things to help your stress: ? Getting regular exercise. ? Doing deep-breathing exercises. ? Doing yoga. ? Meditating. ? Doing a body scan. To do this, close your eyes, focus on one area of your body at a time from head to toe. Notice which parts of your body are tense. Try to relax the muscles in those areas. How will I feel when I quit smoking? Day 1 to 3 weeks Within the first 24 hours,  you may start to have some problems that come from quitting tobacco. These problems are very bad 2-3 days after you quit, but they do not often last for more than 2-3 weeks. You may get these symptoms:  Mood swings.  Feeling restless, nervous, angry, or annoyed.  Trouble concentrating.  Dizziness.  Strong desire for high-sugar foods and nicotine.  Weight gain.  Trouble pooping (constipation).  Feeling like you may vomit (nausea).  Coughing or a sore throat.  Changes in how the medicines that you take for other issues work in your body.  Depression.  Trouble sleeping (insomnia). Week 3 and afterward After the first 2-3 weeks of quitting, you may start to notice more positive results, such as:  Better sense of smell and taste.  Less coughing and sore throat.  Slower heart rate.  Lower blood pressure.  Clearer skin.  Better breathing.  Fewer sick days. Quitting smoking can be hard. Do not give up if you fail the first time. Some people need to try a few times before they succeed. Do your best to stick to your quit plan, and talk with your doctor if you have any questions or concerns. Summary  Smoking tobacco is the leading cause of preventable death. Quitting smoking can be hard, but it is one of the best things that you can do for your health.  When you decide to quit smoking, make a plan to help you succeed.  Quit smoking right away, not slowly over a period of time.  When you start quitting, seek help from your doctor, family, or friends. This information is not intended to replace advice given to you by your health care provider. Make sure you discuss any questions you have with your health care provider. Document Revised: 08/28/2019 Document Reviewed: 02/21/2019 Elsevier Patient Education  2020 [employer]  

## 2020-03-22 NOTE — Progress Notes (Addendum)
Subjective:    Patient ID: Raymond Walker., male    DOB: January 19, 1951, 69 y.o.   MRN: [id number]  HPI Pt here today for hospital follow up. Pt was in ER from 01/23/20-01/30/20 for sepsis. Pt was found by a friend and was unresponsive. Pt also had abscess on right thigh that has become infected; was taken care of in hospital. Pt was on Keflex from hospital but patient states that he felt dizziness and had a headache from antibiotic so he only took one day worth.    Pt niece [name] is with patient today. Pt is staying with niece at this time. Pt is technically homeless at this time and niece is working with [facility] to get patient help with finding a low income apartment. Niece also has concerns with patient bathing. Pt is only using sanitary wipes to clean himself.   We had a long discussion today with patient.  He feels that he is able to manage his repairs but patient does not have much in the way of finances.  He would benefit from a low cost housing situation.  His niece will help look in on him to make sure he is taking his medicines and to help him with his finances  He is a long-term smoker who does not want to quit but is willing to cut back he is trying to keep no smoking to less than a pack every 2 days He denies any type of chest tightness pressure pain shortness of breath Review of Systems     Objective:   Physical Exam Lungs are clear respiratory rate normal heart regular no murmurs abdomen soft no guarding or rebound patient is thin scarring from his wound on his leg is reviewed and do not find any evidence of any abscess going on currently or redness 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 Iron deficiency anemia, unspecified iron deficiency anemia type We will check some lab work with him.  Patient will continue current diet - CBC with Differential - Basic Metabolic Panel (BMET) - Ferritin - Iron Binding Cap (TIBC)(Labcorp/Sunquest) -  Hepatic function panel - B12  2. Hematuria, unspecified type Patient unable to give urine initially when in the hospital he states he had blood in the urine when I reviewed over the record there was some RBCs.  We will repeat urinalysis on future visit CT scan from December showed kidney stones but no masses - CBC with Differential - Basic Metabolic Panel (BMET) - Ferritin - Iron Binding Cap (TIBC)(Labcorp/Sunquest) - Hepatic function panel - B12  3. Chronic obstructive pulmonary disease, unspecified COPD type ([place]) Patient does smoke but has cut it down to half a pack a day which I think is very good for him I am encouraged him to try to quit but it is unlikely he will - CBC with Differential - Basic Metabolic Panel (BMET) - Ferritin - Iron Binding Cap (TIBC)(Labcorp/Sunquest) - Hepatic function panel - B12  4. Right leg pain He does have the ongoing right leg pain but it is improving with the treatment he follows with infectious disease on a regular basis he states he was unable to tolerate the Keflex he states it made him feel dizzy and nauseous he is requesting a different antibiotic - CBC with Differential - Basic Metabolic Panel (BMET) - Ferritin - Iron Binding Cap (TIBC)(Labcorp/Sunquest) - Hepatic function panel - B12  5. Anemia due to  vitamin B12 deficiency, unspecified B12 deficiency type We will check lab work await the results - CBC with Differential - Basic Metabolic Panel (BMET) - Ferritin - Iron Binding Cap (TIBC)(Labcorp/Sunquest) - Hepatic function panel - B12  6. Encounter for screening for lung cancer Unlikely patient will ever quit smoking but he is interested in doing low-dose screening for lung cancer - CT CHEST LUNG CA SCREEN LOW DOSE W/O CM  30 minutes spent with patient discussing multiple aspects of reviewing his records and documenting  I communicated with infectious disease Dr.  Dr. Rhina Brackett Dam it was recommended to try Augmentin 875 mg  twice daily #60 with 12 refills patient is to keep follow-up visit with infectious disease.  If patient cannot tolerate antibiotic he is to let us know in the next choice would be plain amoxicillin we will have the nurses communicate this to the patient

## 2020-03-22 NOTE — Telephone Encounter (Signed)
We sent referral order to [facility] 10/27/2019 for low dose CT lung cancer screening, they have been in touch with the patient and will be contacting him again post hospital stay  Do we need to cancel that & send new order to [facility]?   Please advise

## 2020-03-22 NOTE — Telephone Encounter (Signed)
Nurses  Please communicate to the patient that I communicated with his infectious disease specialist Dr. Rhina Brackett Dam  Because the patient could not tolerate Keflex they recommend stopping Keflex.  Start Augmentin 875 mg 1 twice daily, #60, 12 refills it is important that the patient stay on this medicine through his visit with the infectious disease doctor as planned previously  Take the medication with a snack to lessen the chance of upset stomach May cause some mild diarrhea but if having severe diarrhea or cannot tolerate medicine very important for the patient to let us know. (Infectious disease doctor stated plain amoxicillin would be the next choice)  Please communicate to the patient plus send in the prescription with instructions to stop the Keflex

## 2020-03-22 NOTE — Telephone Encounter (Signed)
I am fine with Raymond Walker doing this please notify his niece [name] that someone from their group will reach out to him regarding this issue thank you

## 2020-03-23 ENCOUNTER — Other Ambulatory Visit: Payer: Self-pay | Admitting: *Deleted

## 2020-03-23 DIAGNOSIS — D519 Vitamin B12 deficiency anemia, unspecified: Secondary | ICD-10-CM | POA: Diagnosis not present

## 2020-03-23 DIAGNOSIS — R319 Hematuria, unspecified: Secondary | ICD-10-CM | POA: Diagnosis not present

## 2020-03-23 DIAGNOSIS — J449 Chronic obstructive pulmonary disease, unspecified: Secondary | ICD-10-CM | POA: Diagnosis not present

## 2020-03-23 DIAGNOSIS — M79604 Pain in right leg: Secondary | ICD-10-CM | POA: Diagnosis not present

## 2020-03-23 DIAGNOSIS — D509 Iron deficiency anemia, unspecified: Secondary | ICD-10-CM | POA: Diagnosis not present

## 2020-03-23 MED ORDER — AMOXICILLIN-POT CLAVULANATE 875-125 MG PO TABS: 1.0000 | ORAL_TABLET | Freq: Two times a day (BID) | ORAL | 12 refills | Status: DC

## 2020-03-23 NOTE — Telephone Encounter (Signed)
Called and discussed with pt to stop keflex and start augmentin and stay on med til he sees infectious disease. Pt verbalized understanding and he states if he is having trouble with med he will let us know. Med sent to pharm with a note to pharm to cancel keflex.

## 2020-03-24 ENCOUNTER — Encounter: Payer: Self-pay | Admitting: Family Medicine

## 2020-03-24 LAB — IRON AND TIBC
Iron Saturation: 18 % (ref 15–55)
Iron: 53 ug/dL (ref 38–169)
Total Iron Binding Capacity: 291 ug/dL (ref 250–450)
UIBC: 238 ug/dL (ref 111–343)

## 2020-03-24 LAB — HEPATIC FUNCTION PANEL
ALT: 7 IU/L (ref 0–44)
AST: 15 IU/L (ref 0–40)
Albumin: 4.3 g/dL (ref 3.8–4.8)
Alkaline Phosphatase: 107 IU/L (ref 39–117)
Bilirubin Total: 0.2 mg/dL (ref 0.0–1.2)
Bilirubin, Direct: 0.11 mg/dL (ref 0.00–0.40)
Total Protein: 7.6 g/dL (ref 6.0–8.5)

## 2020-03-24 LAB — CBC WITH DIFFERENTIAL/PLATELET
Basophils Absolute: 0 10*3/uL (ref 0.0–0.2)
Basos: 1 %
EOS (ABSOLUTE): 0.1 10*3/uL (ref 0.0–0.4)
Eos: 2 %
Hematocrit: 40.3 % (ref 37.5–51.0)
Hemoglobin: 13.2 g/dL (ref 13.0–17.7)
Immature Grans (Abs): 0 10*3/uL (ref 0.0–0.1)
Immature Granulocytes: 0 %
Lymphocytes Absolute: 1.7 10*3/uL (ref 0.7–3.1)
Lymphs: 37 %
MCH: 28.6 pg (ref 26.6–33.0)
MCHC: 32.8 g/dL (ref 31.5–35.7)
MCV: 87 fL (ref 79–97)
Monocytes Absolute: 0.6 10*3/uL (ref 0.1–0.9)
Monocytes: 12 %
Neutrophils Absolute: 2.2 10*3/uL (ref 1.4–7.0)
Neutrophils: 48 %
Platelets: 151 10*3/uL (ref 150–450)
RBC: 4.62 x10E6/uL (ref 4.14–5.80)
RDW: 14.1 % (ref 11.6–15.4)
WBC: 4.7 10*3/uL (ref 3.4–10.8)

## 2020-03-24 LAB — BASIC METABOLIC PANEL
BUN/Creatinine Ratio: 19 (ref 10–24)
BUN: 15 mg/dL (ref 8–27)
CO2: 24 mmol/L (ref 20–29)
Calcium: 9.5 mg/dL (ref 8.6–10.2)
Chloride: 105 mmol/L (ref 96–106)
Creatinine, Ser: 0.81 mg/dL (ref 0.76–1.27)
GFR calc Af Amer: 105 mL/min/{1.73_m2} (ref >59)
GFR calc non Af Amer: 91 mL/min/{1.73_m2} (ref >59)
Glucose: 77 mg/dL (ref 65–99)
Potassium: 3.9 mmol/L (ref 3.5–5.2)
Sodium: 143 mmol/L (ref 134–144)

## 2020-03-24 LAB — FERRITIN: Ferritin: 149 ng/mL (ref 30–400)

## 2020-03-24 LAB — VITAMIN B12: Vitamin B-12: 688 pg/mL (ref 232–1245)

## 2020-03-25 ENCOUNTER — Other Ambulatory Visit: Payer: Self-pay | Admitting: *Deleted

## 2020-03-25 NOTE — Patient Outreach (Signed)
[facility]) Care Management  03/25/2020  Leonel Mccollum. 05-20-1951 [id number]   Weekly transition of care call placed to [facility] caregiver [place]. She report member is continuing to progress well.  He has follow up appointment with PCP on 4/6, was able to drive himself, she met him there for support.  He has another appointment on 4/21.  He os still doing his own wound care, denies any signs/symptoms of ongoing infection.  Confirmed that they have picked up prescription for new antibiotic, has stopped taking Keflex.  She state that she still have not received a call from social worker to discuss community resources and housing opportunities.  Provided with contact information of assigned CSW, encouraged to call.  Denies any urgent concerns, will follow up within the next week.  THN CM Care Plan Problem One     Most Recent Value  Care Plan Problem One  Risk for readmission related to infection as evidenced by recent hospitalization requiring SNF/rehab stay  Role Documenting the Problem One  Care Management [place] for Problem One  Active  [facility] Long Term Goal   Member will not be readmitted to acute care facility within the nexft 31 days  THN Long Term Goal Start Date  03/17/20  Interventions for Problem One Long Term Goal  Reviewed upcoming appointments with caregiver, provided contact information for social worker  [facility] CM Short Term Goal #1   Member will have plan for home health therapy within the next 2 weeks  THN CM Short Term Goal #1 Start Date  03/17/20  [facility] CM Short Term Goal #1 Met Date  03/25/20  THN CM Short Term Goal #2   Caregiver and member will report compliance with medications over the next 3 weeks  THN CM Short Term Goal #2 Start Date  03/17/20  Interventions for Short Term Goal #2  Discussed medication change with caregiver and importance of taking in its entirety     [name], Therapist, sports, MSN [place]  Manager [phone]

## 2020-03-27 ENCOUNTER — Telehealth: Payer: Self-pay | Admitting: Family Medicine

## 2020-03-27 ENCOUNTER — Encounter: Payer: Self-pay | Admitting: Family Medicine

## 2020-03-27 NOTE — Telephone Encounter (Signed)
Front Form was filled out regarding the [facility] There are several areas that need additional information put in Plus also I dictated a letter that needs to go with this Ideally I will need to sign that letter as well Once all of this is completed please connect with his niece-[name] who is his guardian Then forward all of that to her thank you

## 2020-04-01 ENCOUNTER — Other Ambulatory Visit: Payer: Self-pay | Admitting: *Deleted

## 2020-04-01 NOTE — Patient Outreach (Signed)
[facility] [facility]) Care Management  04/01/2020  Tee Richeson. 02/28/1951 [id number]   Weekly transition of care call placed to caregiver/niece [facility].  She report member is continuing to improve, becoming more independent. He has been able to drive more and run errands as well as go to MD appointments (she still meet him at the office for support).  They have appointment coming up with the [place] representative to review long term care planning options.  The have reviewed lists of affordable income housing and option for aide and attendance so when he is on his own he can still have support.  She denies that he is showing any signs of recurrent infection, will have another follow up with PCP on 4/21.  Encouraged to contact this care manager with questions, will follow up within the next week.  THN CM Care Plan Problem One     Most Recent Value  Care Plan Problem One  Risk for readmission related to infection as evidenced by recent hospitalization requiring SNF/rehab stay  Role Documenting the Problem One  Care Management [place] for Problem One  Active  [facility] Long Term Goal   Member will not be readmitted to acute care facility within the nexft 31 days  THN Long Term Goal Start Date  03/17/20  Interventions for Problem One Long Term Goal  Caregiver educated on importance of self monitoring of vitals in effort to manage chronic medical conditions  THN CM Short Term Goal #1   Caregiver will report plan to have member in his own residence within the next 3 weeks  THN CM Short Term Goal #1 Start Date  04/01/20  Interventions for Short Term Goal #1  Confirmed member has appointment with [place] representative for long term care planning.  Encouraged to contact assigned CSW for additional support.  THN CM Short Term Goal #2   Caregiver and member will report compliance with medications over the next 3 weeks  THN CM Short Term Goal #2 Start Date  03/17/20  [facility] CM Short Term Goal #2  Met Date  04/01/20     [name], RN, MSN [place] [employer] [phone]

## 2020-04-05 ENCOUNTER — Telehealth: Payer: Self-pay | Admitting: Family Medicine

## 2020-04-05 NOTE — Telephone Encounter (Signed)
May keep office visit to wear a mask

## 2020-04-05 NOTE — Telephone Encounter (Signed)
Please advise. Thank you

## 2020-04-05 NOTE — Telephone Encounter (Signed)
While doing Covid prescreening questions pt states he's had a runny nose since the pollen started (2 weeks), NO other symptoms, NO known exposure  ? Change to phone visit or keep as OV?  Please advise & call pt   (notify front to make any necessary appt type changes)

## 2020-04-05 NOTE — Telephone Encounter (Signed)
Called & notified pt, pt verbalized understanding

## 2020-04-05 NOTE — Telephone Encounter (Signed)
Front staff notified and will call patient back to inform him.

## 2020-04-06 ENCOUNTER — Other Ambulatory Visit: Payer: Self-pay

## 2020-04-06 ENCOUNTER — Ambulatory Visit (INDEPENDENT_AMBULATORY_CARE_PROVIDER_SITE_OTHER): Payer: Medicare Other | Admitting: Family Medicine

## 2020-04-06 ENCOUNTER — Encounter: Payer: Self-pay | Admitting: Family Medicine

## 2020-04-06 VITALS — BP 122/82 | Temp 98.3°F | Wt 148.6 lb

## 2020-04-06 DIAGNOSIS — J438 Other emphysema: Secondary | ICD-10-CM | POA: Diagnosis not present

## 2020-04-06 DIAGNOSIS — I700 Atherosclerosis of aorta: Secondary | ICD-10-CM

## 2020-04-06 NOTE — Progress Notes (Signed)
Subjective:    Patient ID: Raymond Radon., male    DOB: [birth year], 69 y.o.   MRN: [id number]  HPI   Patient arrives for a follow up on recent labs. Patient states he is feeling good with no concerns. This patient does have some underlying issues of COPD states his breathing is doing all right Also has history of infection although that is doing better now Results for orders placed or performed in visit on 03/22/20  CBC with Differential  Result Value Ref Range   WBC 4.7 3.4 - 10.8 x10E3/uL   RBC 4.62 4.14 - 5.80 x10E6/uL   Hemoglobin 13.2 13.0 - 17.7 g/dL   Hematocrit 40.3 37.5 - 51.0 %   MCV 87 79 - 97 fL   MCH 28.6 26.6 - 33.0 pg   MCHC 32.8 31.5 - 35.7 g/dL   RDW 14.1 11.6 - 15.4 %   Platelets 151 150 - 450 x10E3/uL   Neutrophils 48 Not Estab. %   Lymphs 37 Not Estab. %   Monocytes 12 Not Estab. %   Eos 2 Not Estab. %   Basos 1 Not Estab. %   Neutrophils Absolute 2.2 1.4 - 7.0 x10E3/uL   Lymphocytes Absolute 1.7 0.7 - 3.1 x10E3/uL   Monocytes Absolute 0.6 0.1 - 0.9 x10E3/uL   EOS (ABSOLUTE) 0.1 0.0 - 0.4 x10E3/uL   Basophils Absolute 0.0 0.0 - 0.2 x10E3/uL   Immature Granulocytes 0 Not Estab. %   Immature Grans (Abs) 0.0 0.0 - 0.1 [id number]  Basic Metabolic Panel (BMET)  Result Value Ref Range   Glucose 77 65 - 99 mg/dL   BUN 15 8 - 27 mg/dL   Creatinine, Ser 0.81 0.76 - 1.27 mg/dL   GFR calc non Af Amer 91 >59 mL/min/1.73   GFR calc Af Amer 105 >59 mL/min/1.73   BUN/Creatinine Ratio 19 10 - 24   Sodium 143 134 - 144 mmol/L   Potassium 3.9 3.5 - 5.2 mmol/L   Chloride 105 96 - 106 mmol/L   CO2 24 20 - 29 mmol/L   Calcium 9.5 8.6 - 10.2 mg/dL  Ferritin  Result Value Ref Range   Ferritin 149 30 - 400 ng/mL  Iron Binding Cap (TIBC)(Labcorp/Sunquest)  Result Value Ref Range   Total Iron Binding Capacity 291 250 - 450 ug/dL   UIBC 238 111 - 343 ug/dL   Iron 53 38 - 169 ug/dL   Iron Saturation 18 15 - 55 %  Hepatic function panel  Result Value Ref Range   Total Protein 7.6 6.0 - 8.5 g/dL   Albumin 4.3 3.8 - 4.8 g/dL   Bilirubin Total 0.2 0.0 - 1.2 mg/dL   Bilirubin, Direct 0.11 0.00 - 0.40 mg/dL   Alkaline Phosphatase 107 39 - 117 IU/L   AST 15 0 - 40 IU/L   ALT 7 0 - 44 IU/L  B12  Result Value Ref Range   Vitamin B-12 688 232 - 1,245 pg/mL    Review of Systems  Constitutional: Negative for activity change, fatigue and fever.  HENT: Negative for congestion and rhinorrhea.   Respiratory: Negative for cough and shortness of breath.   Cardiovascular: Negative for chest pain and leg swelling.  Gastrointestinal: Negative for abdominal pain, diarrhea and nausea.  Genitourinary: Negative for dysuria and hematuria.  Neurological: Negative for weakness and headaches.  Psychiatric/Behavioral: Negative for agitation and behavioral problems.       Objective:   Physical Exam Vitals reviewed.  Constitutional:  General: He is not in acute distress. HENT:     Head: Normocephalic and atraumatic.  Eyes:     General:        Right eye: No discharge.        Left eye: No discharge.  Neck:     Trachea: No tracheal deviation.  Cardiovascular:     Rate and Rhythm: Normal rate and regular rhythm.     Heart sounds: Normal heart sounds. No murmur.  Pulmonary:     Effort: Pulmonary effort is normal. No respiratory distress.     Breath sounds: Normal breath sounds.  Lymphadenopathy:     Cervical: No cervical adenopathy.  Skin:    General: Skin is warm and dry.  Neurological:     Mental Status: He is alert.     Coordination: Coordination normal.  Psychiatric:        Behavior: Behavior normal.           Assessment & Plan:  Labs reviewed with patient Medications reviewed with patient Patient encouraged to quit smoking Dietary measures encouraged with patient No sign of any type of new ulcers on the legs Follow-up 4 to 6 months  Aortic atherosclerosis on CAT scan continue rosuvastatin COPD use albuterol as needed encourage  patient to quit smoking

## 2020-04-06 NOTE — Patient Instructions (Signed)
Results for orders placed or performed in visit on 03/22/20  CBC with Differential  Result Value Ref Range   WBC 4.7 3.4 - 10.8 x10E3/uL   RBC 4.62 4.14 - 5.80 x10E6/uL   Hemoglobin 13.2 13.0 - 17.7 g/dL   Hematocrit 40.3 37.5 - 51.0 %   MCV 87 79 - 97 fL   MCH 28.6 26.6 - 33.0 pg   MCHC 32.8 31.5 - 35.7 g/dL   RDW 14.1 11.6 - 15.4 %   Platelets 151 150 - 450 x10E3/uL   Neutrophils 48 Not Estab. %   Lymphs 37 Not Estab. %   Monocytes 12 Not Estab. %   Eos 2 Not Estab. %   Basos 1 Not Estab. %   Neutrophils Absolute 2.2 1.4 - 7.0 x10E3/uL   Lymphocytes Absolute 1.7 0.7 - 3.1 x10E3/uL   Monocytes Absolute 0.6 0.1 - 0.9 x10E3/uL   EOS (ABSOLUTE) 0.1 0.0 - 0.4 x10E3/uL   Basophils Absolute 0.0 0.0 - 0.2 x10E3/uL   Immature Granulocytes 0 Not Estab. %   Immature Grans (Abs) 0.0 0.0 - 0.1 [id number]  Basic Metabolic Panel (BMET)  Result Value Ref Range   Glucose 77 65 - 99 mg/dL   BUN 15 8 - 27 mg/dL   Creatinine, Ser 0.81 0.76 - 1.27 mg/dL   GFR calc non Af Amer 91 >59 mL/min/1.73   GFR calc Af Amer 105 >59 mL/min/1.73   BUN/Creatinine Ratio 19 10 - 24   Sodium 143 134 - 144 mmol/L   Potassium 3.9 3.5 - 5.2 mmol/L   Chloride 105 96 - 106 mmol/L   CO2 24 20 - 29 mmol/L   Calcium 9.5 8.6 - 10.2 mg/dL  Ferritin  Result Value Ref Range   Ferritin 149 30 - 400 ng/mL  Iron Binding Cap (TIBC)(Labcorp/Sunquest)  Result Value Ref Range   Total Iron Binding Capacity 291 250 - 450 ug/dL   UIBC 238 111 - 343 ug/dL   Iron 53 38 - 169 ug/dL   Iron Saturation 18 15 - 55 %  Hepatic function panel  Result Value Ref Range   Total Protein 7.6 6.0 - 8.5 g/dL   Albumin 4.3 3.8 - 4.8 g/dL   Bilirubin Total 0.2 0.0 - 1.2 mg/dL   Bilirubin, Direct 0.11 0.00 - 0.40 mg/dL   Alkaline Phosphatase 107 39 - 117 IU/L   AST 15 0 - 40 IU/L   ALT 7 0 - 44 IU/L  B12  Result Value Ref Range   Vitamin B-12 688 232 - 1,245 pg/mL

## 2020-04-08 ENCOUNTER — Other Ambulatory Visit: Payer: Self-pay | Admitting: *Deleted

## 2020-04-08 NOTE — Patient Outreach (Signed)
[facility] [facility]) Care Management  04/08/2020  Chanler Artis. 1951-02-14 [id number]   Weekly transition of care call placed to member's niece, [name], no answer.  HIPAA compliant voice message left, will follow up within the next 3-4 business days.  [name], [place], MSN [place] [phone]

## 2020-04-14 ENCOUNTER — Other Ambulatory Visit: Payer: Self-pay | Admitting: *Deleted

## 2020-04-14 NOTE — Patient Outreach (Signed)
[facility]) Care Management  04/14/2020  Jai Steil. 04-10-1951 [id number]   Weekly transition of care call placed to member's caregiver/niece.  She report member has continued to progress, was last seen by PCP on 4/21, next visit recommended for 4 months.  He does have appointment with ID on 5/17.  State they have decided not to use Medicaid as a source of placement as member does not want his entire check taken.  Niece report they were under the impression that he would be able to receive aide and attendance through the [place] system but has since found out the he is not eligible for services.  This care manger discussed having member assessed for PCS, she agrees, feel this will benefit the member tremendously once he is on his own.  She also is concerned about his literacy, would like him to have some classes if possible.    Niece denies any urgent concerns, advised to contact this care manager with questions.  This care manager will contact assigned CSW Gildardo Griffes) to follow up with outreach to [employer] regarding community resources.  Will follow up with member/family within the next month.  THN CM Care Plan Problem One     Most Recent Value  Care Plan Problem One  Risk for readmission related to infection as evidenced by recent hospitalization requiring SNF/rehab stay  Role Documenting the Problem One  Care Management [place] for Problem One  Active  [facility] Long Term Goal   Member will not be readmitted to acute care facility within the nexft 31 days  THN Long Term Goal Start Date  03/17/20  [facility] Long Term Goal Met Date  04/14/20  [facility] CM Short Term Goal #1   Caregiver will report plan to have member in his own residence within the next 3 weeks  THN CM Short Term Goal #1 Start Date  04/01/20  Interventions for Short Term Goal #1  Collaborated with CSW, update provided, reqeust made to evaluate for PCS     [name], Therapist, sports, MSN [place] Manager [phone]

## 2020-04-15 ENCOUNTER — Telehealth: Payer: Self-pay | Admitting: *Deleted

## 2020-04-15 ENCOUNTER — Other Ambulatory Visit: Payer: Self-pay | Admitting: *Deleted

## 2020-04-15 MED ORDER — SUCRALFATE 1 G PO TABS: 1.0000 g | ORAL_TABLET | Freq: Three times a day (TID) | ORAL | 3 refills | Status: DC

## 2020-04-15 MED ORDER — ROSUVASTATIN CALCIUM 20 MG PO TABS: 20.0000 mg | ORAL_TABLET | Freq: Every day | ORAL | 3 refills | Status: DC

## 2020-04-15 MED ORDER — CLOPIDOGREL BISULFATE 75 MG PO TABS: 75.0000 mg | ORAL_TABLET | Freq: Every day | ORAL | 3 refills | Status: DC

## 2020-04-15 NOTE — Progress Notes (Deleted)
Pt's niece [name] calling to get refills on crestor, plavix and carafate. Pt had check up on 04/06/20. carafate is not in your name. She states it was given while he was in hospital but he is still taking 4 times a day.  [facility] on [address]  [name] - [phone]

## 2020-04-15 NOTE — Telephone Encounter (Signed)
Pt's niece [name] calling to get refills on crestor, plavix and carafate. Pt had check up on 04/06/20. carafate is not in your name. She states it was given while he was in hospital but he is still taking 4 times a day.  [facility] on [address]  [name] - [phone]

## 2020-04-15 NOTE — Telephone Encounter (Signed)
4 months on all of these As per the Carafate 1 g tablet, 120, before every meal and nightly with 3 refills

## 2020-04-15 NOTE — Telephone Encounter (Signed)
Refills sent and pt's niece notified

## 2020-04-19 ENCOUNTER — Other Ambulatory Visit: Payer: Self-pay | Admitting: *Deleted

## 2020-04-19 DIAGNOSIS — F17210 Nicotine dependence, cigarettes, uncomplicated: Secondary | ICD-10-CM

## 2020-04-19 DIAGNOSIS — Z87891 Personal history of nicotine dependence: Secondary | ICD-10-CM

## 2020-04-20 ENCOUNTER — Telehealth: Payer: Self-pay | Admitting: *Deleted

## 2020-04-20 NOTE — Patient Outreach (Signed)
[facility] Care Management  04/20/2020  [place] [birth year] [id number]   CSW received referral from [place] that patient is currently living with his niece [name] who is his POA (she's the one you should contact) but they are looking for independent senior living for him. 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 He does have [employer] so I was thinking he may be eligible for PCS. They will need assistance with this process. Also, the niece feel he would be benefit from some type of education courses. He does not read or write well and getting help with this could help him be more successful in his independence.    CSW called & spoke with patient's niece/POA, [name] to discuss options. Niece states that they would prefer that he go to an apartment, rather than ALF/Family Care Home because he wouldn't qualify as he is independent with his ADLs, though he doesn't really take showers as he is afraid of slipping and falling in the shower, he does use wipes and washcloth though. They would also like to stay with an apartment rather than ALF/FCH as he would prefer to not have to turn over his monthly check so that he can pay for his car, insurance, cellphone, etc. Per [name], he is on the waitlist for [facility] and has been told by their manager that he will likely have an opening for him in the next month. CSW spoke with [place] about programs provided by the [facility] in [place] ([place]) and will have brochure, calendar & google maps from [facility] to [place] mailed to patient. [name] is hopeful that once he moves out, that he will become more independent and interested in social events.   CSW will follow-up with [name] in 1 month.    Raynaldo Opitz, LCSW [facility]  Clinical Social  Worker cell #: [phone]

## 2020-04-26 ENCOUNTER — Other Ambulatory Visit: Payer: Self-pay | Admitting: *Deleted

## 2020-04-26 NOTE — Patient Outreach (Signed)
[facility]) Care Management  04/26/2020  Raymond Walker. 11-May-1951 [id number]   Call received from member's niece, [name], voicing concern regarding member's hygiene.  State he has not showered since being discharged home.  He has taken sponge baths but state he has now showered or washed his hair.  She is concerned, stating she has discussed his risk for infection with poor hygiene.  He report being afraid that he will fall while there.  She has told him that she will be available for support but he continues to refuse.  She has placed non-slip mat in the tub as well to decrease risk for fall.  Discussed interventions advised to get shower chair for him to sit in.  She is unsure if the tub is big enough for this but will try it.  If not, she will try to use a stool for him to sit.  She is also requesting someone to come out to the home to assess him and assist with getting him to shower.  She is aware that [facility] is not making home visits at this time.    She report member is becoming moody and irritable, not talking much.  Unsure if this is due to change in medical condition or if it is frustration with having to remain in her home.  She did speak with [facility] in [place], he is next on the wait list for apartment.  She is hoping to have him in the apartment within the next 30-60 days, but this is not a definite.    Denies any urgent concerns at this time, will follow up with niece within the next 2 weeks.  Will contact [facility] to inquire about home visit.  [name], [place], MSN [place] [phone]

## 2020-05-02 ENCOUNTER — Ambulatory Visit (INDEPENDENT_AMBULATORY_CARE_PROVIDER_SITE_OTHER): Payer: Medicare Other | Admitting: Infectious Disease

## 2020-05-02 ENCOUNTER — Other Ambulatory Visit: Payer: Self-pay

## 2020-05-02 ENCOUNTER — Encounter: Payer: Self-pay | Admitting: Infectious Disease

## 2020-05-02 DIAGNOSIS — N529 Male erectile dysfunction, unspecified: Secondary | ICD-10-CM | POA: Insufficient documentation

## 2020-05-02 DIAGNOSIS — M869 Osteomyelitis, unspecified: Secondary | ICD-10-CM

## 2020-05-02 DIAGNOSIS — N5201 Erectile dysfunction due to arterial insufficiency: Secondary | ICD-10-CM

## 2020-05-02 DIAGNOSIS — E11621 Type 2 diabetes mellitus with foot ulcer: Secondary | ICD-10-CM

## 2020-05-02 DIAGNOSIS — T827XXD Infection and inflammatory reaction due to other cardiac and vascular devices, implants and grafts, subsequent encounter: Secondary | ICD-10-CM

## 2020-05-02 DIAGNOSIS — R5383 Other fatigue: Secondary | ICD-10-CM

## 2020-05-02 DIAGNOSIS — L97509 Non-pressure chronic ulcer of other part of unspecified foot with unspecified severity: Secondary | ICD-10-CM

## 2020-05-02 DIAGNOSIS — E1169 Type 2 diabetes mellitus with other specified complication: Secondary | ICD-10-CM

## 2020-05-02 HISTORY — DX: Other fatigue: R53.83

## 2020-05-02 HISTORY — DX: Male erectile dysfunction, unspecified: N52.9

## 2020-05-02 NOTE — Progress Notes (Signed)
Virtual Visit via Telephone Note  I connected with Raymond Walker. on 05/02/20 at 11:15 AM EDT by telephone and verified that I am speaking with the correct person using two identifiers.  Location: Patient: At his guardians home Provider: [facility]   I discussed the limitations, risks, security and privacy concerns of performing an evaluation and management service by telephone and the availability of in person appointments. I also discussed with the patient that there may be a patient responsible charge related to this service. The patient's guardian expressed understanding and agreed to proceed.   History of Present Illness:  69 y.o.maleformer smoker with peripheral vascular disease who developed gangrene of his toes and was admitted to the hospital in September with methicillin sensitive Staph aureus bacteremia. He underwent amputation and then also a right common femoral to above-knee popliteal artery bypass PTFE graft.He had a 2D echocardiogram that was negative for endocarditis but did not undergo transesophageal echocardiogram.  He was then admitted in February after being found down.  He underwent a CT angiogram which showed phlegmonous changes around the proximal aspect of the graft beyond the anastomosis. There is also 2.3 x 1.7 x 5.5 cm abscess in the medial thigh. Imaging of the chest had shown also multiple nodules concerning for possible malignancy versus septic embolism.  He was taken to the operating room today and underwent portion of his right above-the-knee popliteal graft anastomosis.Graft itself did not appear overtly infected. There was an area at the saphenous vein graft site that was opened and found to be grossly purulent. This was sent for culture.  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  We were consulted by vascular surgery due to concerns that his graft itself might be infected and that he might need long-term suppressive  antibiotics  I initially had him on Keflex 4 times daily but he did not like how it made him feel and he was eventually changed over to Augmentin by his primary care physician.  He seems to be tolerating the Augmentin twice daily without difficulty.  He is not having evidence of recurrence at his vascular site nor is he had pain or evidence of recurrence at his transmetatarsal amputation site according to his guardian.  There is however a great deal of caregiver fatigue in the part of his guardian.  The patient is adamant about continuing to smoke and does not adhere to many of his therapies.  He refused to come to clinic for example today as well.  He did have problems being incarcerated due to legal problems in the past which his guardian shared with me.      Observations/Objective:  He seems to be doing relatively well from an infectious the standpoint based on what his guardian tells me the other larger issues surrounding his overall health and caregiver fatigue and burnout on her part due to his not being agreeable to many of the therapies he needs to take part into improve his health  Assessment and Plan: Possible graft infection: Continue Augmentin and could contemplate amoxicillin 3 times daily instead  Osteomyelitis status post transmetatarsal amputation: Site needs to be vigilantly monitored.  Smoking we helpful if he could stop but does not cellulitis going to happen anytime soon  Social issues: Difficulty for his caregiver his guardian who is experiencing burnout taking care of him at present.  She also though does not want to lose his life savings in a skilled nursing facility or other home.  She  also had lost her mother this past fall.  Erectile dysfunction: The guardian was asked about the safety of him taking Viagra.  I counseled her the large issue would be the vasodilatory effects and risk for low blood pressure with standing.  Follow Up Instructions:    I  discussed the assessment and treatment plan with the patient. The patient was provided an opportunity to ask questions and all were answered. The patient agreed with the plan and demonstrated an understanding of the instructions.   The patient was advised to call back or seek an in-person evaluation if the symptoms worsen or if the condition fails to improve as anticipated.  I provided 15 minutes of non-face-to-face time during this encounter.   Alcide Evener, MD

## 2020-05-06 ENCOUNTER — Other Ambulatory Visit: Payer: Self-pay | Admitting: *Deleted

## 2020-05-06 NOTE — Patient Outreach (Signed)
[facility]) Care Management  05/06/2020  [place] November 22, 1951 [id number]   Call received from [facility] with [facility] to provide update from home visit today.  Per her assessment, member would benefit from personal care services as well as behavioral services as member has an extensive personal and family history from which he is still having problems with coping.  His sister, whom he was living with prior to his hospitalizations, recently passed away.  CSW referral has already been filed but is currently being reassigned.  Will collaborate with new CSW and provide update of needs.  Nira Conn also report that they will continue to provide home visits for member, will have aide to go out to the home to help with shower next week.  She is concerned about member's circulation and habit of tying his shoes too tight.  He does have some areas of concern on his foot, she will discuss directly with vascular surgeon.  He has not been taking all of his antihypertensives, niece report MD is aware.    This care manager will follow up with niece next week as scheduled.  [name], [place], MSN [place] [phone]

## 2020-05-09 ENCOUNTER — Ambulatory Visit (HOSPITAL_COMMUNITY): Payer: Medicare Other

## 2020-05-09 ENCOUNTER — Encounter: Payer: Self-pay | Admitting: Acute Care

## 2020-05-09 ENCOUNTER — Other Ambulatory Visit: Payer: Self-pay

## 2020-05-09 ENCOUNTER — Ambulatory Visit: Payer: Medicare Other | Admitting: Acute Care

## 2020-05-09 DIAGNOSIS — Z122 Encounter for screening for malignant neoplasm of respiratory organs: Secondary | ICD-10-CM

## 2020-05-09 DIAGNOSIS — F17210 Nicotine dependence, cigarettes, uncomplicated: Secondary | ICD-10-CM

## 2020-05-09 NOTE — Progress Notes (Signed)
Shared Decision Making Visit Lung Cancer Screening Program [phone])   Eligibility:  Age 69 y.o.  Pack Years Smoking History Calculation : 45  pack year smoking history (# packs/per year x # years smoked)  Recent History of coughing up blood  no  Unexplained weight loss? no ( >Than 15 pounds within the last 6 months )  Prior History Lung / other cancer no (Diagnosis within the last 5 years already requiring surveillance chest CT Scans).  Smoking Status Current Smoker  Former Smokers: Years since quit: NA  Quit Date: NA  Visit Components:  Discussion included one or more decision making aids. yes  Discussion included risk/benefits of screening. yes  Discussion included potential follow up diagnostic testing for abnormal scans. yes  Discussion included meaning and risk of over diagnosis. yes  Discussion included meaning and risk of False Positives. yes  Discussion included meaning of total radiation exposure. yes  Counseling Included:  Importance of adherence to annual lung cancer LDCT screening. yes  Impact of comorbidities on ability to participate in the program. yes  Ability and willingness to under diagnostic treatment. yes  Smoking Cessation Counseling:  Current Smokers:   Discussed importance of smoking cessation. yes  Information about tobacco cessation classes and interventions provided to patient. yes  Patient provided with "ticket" for LDCT Scan. yes  Symptomatic Patient. no  CounselingNA  Diagnosis Code: Tobacco Use Z72.0  Asymptomatic Patient yes  Counseling (Intermediate counseling: > three minutes counseling) [id number]  Former Smokers:   Discussed the importance of maintaining cigarette abstinence. yes  Diagnosis Code: Personal History of Nicotine Dependence. [id number]  Information about tobacco cessation classes and interventions provided to patient. Yes  Patient provided with "ticket" for LDCT Scan. yes  Written Order for Lung Cancer  Screening with LDCT placed in Epic. Yes (CT Chest Lung Cancer Screening Low Dose W/O CM) [id number] Z12.2-Screening of respiratory organs Z87.891-Personal history of nicotine dependence  I have spent 25 minutes of face to face time with Mr. Cruzhernandez discussing the risks and benefits of lung cancer screening. We viewed a power point together that explained in detail the above noted topics. We paused at intervals to allow for questions to be asked and answered to ensure understanding.We discussed that the single most powerful action that he can take to decrease his risk of developing lung cancer is to quit smoking. We discussed whether or not he is ready to commit to setting a quit date. We discussed options for tools to aid in quitting smoking including nicotine replacement therapy, non-nicotine medications, support groups, Quit Smart classes, and behavior modification. We discussed that often times setting smaller, more achievable goals, such as eliminating 1 cigarette a day for a week and then 2 cigarettes a day for a week can be helpful in slowly decreasing the number of cigarettes smoked. This allows for a sense of accomplishment as well as providing a clinical benefit. I gave him the " Be Stronger Than Your Excuses" card with contact information for community resources, classes, free nicotine replacement therapy, and access to mobile apps, text messaging, and on-line smoking cessation help. I have also given him my card and contact information in the event he needs to contact me. We discussed the time and location of the scan, and that either Doroteo Glassman RN or I will call with the results within 24-48 hours of receiving them. I have offered him  a copy of the power point we viewed  as a resource in the event they need  reinforcement of the concepts we discussed today in the office. The patient verbalized understanding of all of  the above and had no further questions upon leaving the office. They have my contact  information in the event they have any further questions.  I spent 3 minutes counseling on smoking cessation and the health risks of continued tobacco abuse.  I explained to the patient that there has been a high incidence of coronary artery disease noted on these exams. I explained that this is a non-gated exam therefore degree or severity cannot be determined. This patient is  Currently on statin therapy. I have asked the patient to follow-up with their PCP regarding any incidental finding of coronary artery disease and management with diet or medication as their PCP  feels is clinically indicated. The patient verbalized understanding of the above and had no further questions upon completion of the visit.      Magdalen Spatz, NP 05/09/2020

## 2020-05-09 NOTE — Patient Instructions (Signed)
Thank you for participating in the [facility]. It was our pleasure to meet you today. We will call you with the results of your scan within the next few days. Your scan will be assigned a Lung RADS category score by the physicians reading the scans.  This Lung RADS score determines follow up scanning.  See below for description of categories, and follow up screening recommendations. We will be in touch to schedule your follow up screening annually or based on recommendations of our providers. We will fax a copy of your scan results to your Primary Care Physician, or the physician who referred you to the program, to ensure they have the results. Please call the office if you have any questions or concerns regarding your scanning experience or results.  Our office number is [phone]. Please speak with Denise Phelps, RN. She is our Lung Cancer Screening RN. If she is unavailable when you call, please have the office staff send her a message. She will return your call at her earliest convenience. Remember, if your scan is normal, we will scan you annually as long as you continue to meet the criteria for the program. (Age 55-77, Current smoker or smoker who has quit within the last 15 years). If you are a smoker, remember, quitting is the single most powerful action that you can take to decrease your risk of lung cancer and other pulmonary, breathing related problems. We know quitting is hard, and we are here to help.  Please let us know if there is anything we can do to help you meet your goal of quitting. If you are a former smoker, congratulations. We are proud of you! Remain smoke free! Remember you can refer friends or family members through the number above.  We will screen them to make sure they meet criteria for the program. Thank you for helping us take better care of you by participating in Lung Screening.  Lung RADS Categories:  Lung RADS 1: no nodules  or definitely non-concerning nodules.  Recommendation is for a repeat annual scan in 12 months.  Lung RADS 2:  nodules that are non-concerning in appearance and behavior with a very low likelihood of becoming an active cancer. Recommendation is for a repeat annual scan in 12 months.  Lung RADS 3: nodules that are probably non-concerning , includes nodules with a low likelihood of becoming an active cancer.  Recommendation is for a 6-month repeat screening scan. Often noted after an upper respiratory illness. We will be in touch to make sure you have no questions, and to schedule your 6-month scan.  Lung RADS 4 A: nodules with concerning findings, recommendation is most often for a follow up scan in 3 months or additional testing based on our provider's assessment of the scan. We will be in touch to make sure you have no questions and to schedule the recommended 3 month follow up scan.  Lung RADS 4 B:  indicates findings that are concerning. We will be in touch with you to schedule additional diagnostic testing based on our provider's  assessment of the scan.   

## 2020-05-13 ENCOUNTER — Other Ambulatory Visit: Payer: Self-pay | Admitting: *Deleted

## 2020-05-13 MED ORDER — PANTOPRAZOLE SODIUM 40 MG PO TBEC: 40.0000 mg | DELAYED_RELEASE_TABLET | Freq: Every day | ORAL | 4 refills | Status: DC

## 2020-05-13 NOTE — Patient Outreach (Signed)
[facility]) Care Management  05/13/2020  Raymond Walker. 12/13/1951 [id number]   Call placed to member's niece to follow up on involvement with [facility], no answer.  HIPAA compliant voice message left, will follow up within the next 3-4 business days.  [name], [place], MSN [place] [phone]

## 2020-05-19 ENCOUNTER — Other Ambulatory Visit: Payer: Self-pay | Admitting: *Deleted
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
## 2020-05-20 ENCOUNTER — Ambulatory Visit (HOSPITAL_COMMUNITY): Payer: Medicare Other

## 2020-05-20 ENCOUNTER — Ambulatory Visit: Payer: Medicare Other

## 2020-05-20 ENCOUNTER — Ambulatory Visit (HOSPITAL_COMMUNITY): Payer: Medicare Other | Attending: Vascular Surgery

## 2020-05-21 ENCOUNTER — Encounter (HOSPITAL_COMMUNITY): Payer: Self-pay | Admitting: *Deleted

## 2020-05-21 ENCOUNTER — Emergency Department (HOSPITAL_COMMUNITY)
Admission: EM | Admit: 2020-05-21 | Discharge: 2020-05-21 | Disposition: A | Discharge status: Home / Self Care | Payer: Medicare Other | Attending: Emergency Medicine | Admitting: Emergency Medicine

## 2020-05-21 ENCOUNTER — Emergency Department (HOSPITAL_COMMUNITY): Payer: Medicare Other

## 2020-05-21 ENCOUNTER — Other Ambulatory Visit: Payer: Self-pay

## 2020-05-21 DIAGNOSIS — N3001 Acute cystitis with hematuria: Secondary | ICD-10-CM | POA: Diagnosis not present

## 2020-05-21 DIAGNOSIS — R509 Fever, unspecified: Secondary | ICD-10-CM | POA: Diagnosis not present

## 2020-05-21 DIAGNOSIS — J439 Emphysema, unspecified: Secondary | ICD-10-CM | POA: Diagnosis not present

## 2020-05-21 DIAGNOSIS — F17210 Nicotine dependence, cigarettes, uncomplicated: Secondary | ICD-10-CM | POA: Diagnosis not present

## 2020-05-21 DIAGNOSIS — R5381 Other malaise: Secondary | ICD-10-CM | POA: Diagnosis not present

## 2020-05-21 DIAGNOSIS — Z7982 Long term (current) use of aspirin: Secondary | ICD-10-CM | POA: Diagnosis not present

## 2020-05-21 DIAGNOSIS — Z79899 Other long term (current) drug therapy: Secondary | ICD-10-CM | POA: Diagnosis not present

## 2020-05-21 DIAGNOSIS — J939 Pneumothorax, unspecified: Secondary | ICD-10-CM | POA: Diagnosis not present

## 2020-05-21 DIAGNOSIS — I10 Essential (primary) hypertension: Secondary | ICD-10-CM | POA: Diagnosis not present

## 2020-05-21 DIAGNOSIS — E876 Hypokalemia: Secondary | ICD-10-CM | POA: Insufficient documentation

## 2020-05-21 DIAGNOSIS — Z20822 Contact with and (suspected) exposure to covid-19: Secondary | ICD-10-CM | POA: Diagnosis not present

## 2020-05-21 DIAGNOSIS — R0602 Shortness of breath: Secondary | ICD-10-CM | POA: Diagnosis not present

## 2020-05-21 DIAGNOSIS — Z743 Need for continuous supervision: Secondary | ICD-10-CM | POA: Diagnosis not present

## 2020-05-21 DIAGNOSIS — R062 Wheezing: Secondary | ICD-10-CM | POA: Insufficient documentation

## 2020-05-21 DIAGNOSIS — J441 Chronic obstructive pulmonary disease with (acute) exacerbation: Secondary | ICD-10-CM

## 2020-05-21 LAB — COMPREHENSIVE METABOLIC PANEL
ALT: 10 U/L (ref 0–44)
AST: 10 U/L — ABNORMAL LOW (ref 15–41)
Albumin: 3.3 g/dL — ABNORMAL LOW (ref 3.5–5.0)
Alkaline Phosphatase: 63 U/L (ref 38–126)
Anion gap: 10 (ref 5–15)
BUN: 16 mg/dL (ref 8–23)
CO2: 28 mmol/L (ref 22–32)
Calcium: 8.6 mg/dL — ABNORMAL LOW (ref 8.9–10.3)
Chloride: 96 mmol/L — ABNORMAL LOW (ref 98–111)
Creatinine, Ser: 0.87 mg/dL (ref 0.61–1.24)
GFR calc Af Amer: >60 mL/min (ref >60)
GFR calc non Af Amer: >60 mL/min (ref >60)
Glucose, Bld: 115 mg/dL — ABNORMAL HIGH (ref 70–99)
Potassium: 2.8 mmol/L — ABNORMAL LOW (ref 3.5–5.1)
Sodium: 134 mmol/L — ABNORMAL LOW (ref 135–145)
Total Bilirubin: 0.8 mg/dL (ref 0.3–1.2)
Total Protein: 7.1 g/dL (ref 6.5–8.1)

## 2020-05-21 LAB — LACTIC ACID, PLASMA
Lactic Acid, Venous: 1.3 mmol/L (ref 0.5–1.9)
Lactic Acid, Venous: 0.8 mmol/L (ref 0.5–1.9)

## 2020-05-21 LAB — URINALYSIS, ROUTINE W REFLEX MICROSCOPIC
Bacteria, UA: NONE SEEN
Bilirubin Urine: NEGATIVE
Glucose, UA: NEGATIVE mg/dL
Ketones, ur: NEGATIVE mg/dL
Nitrite: NEGATIVE
Protein, ur: NEGATIVE mg/dL
Specific Gravity, Urine: 1.009 (ref 1.005–1.030)
pH: 7 (ref 5.0–8.0)

## 2020-05-21 LAB — SARS CORONAVIRUS 2 BY RT PCR (HOSPITAL ORDER, PERFORMED IN ~~LOC~~ HOSPITAL LAB): SARS Coronavirus 2: NEGATIVE

## 2020-05-21 LAB — CBC WITH DIFFERENTIAL/PLATELET
Abs Immature Granulocytes: 0.03 10*3/uL (ref 0.00–0.07)
Basophils Absolute: 0 10*3/uL (ref 0.0–0.1)
Basophils Relative: 0 %
Eosinophils Absolute: 0 10*3/uL (ref 0.0–0.5)
Eosinophils Relative: 0 %
HCT: 35.5 % — ABNORMAL LOW (ref 39.0–52.0)
Hemoglobin: 11.5 g/dL — ABNORMAL LOW (ref 13.0–17.0)
Immature Granulocytes: 0 %
Lymphocytes Relative: 18 %
Lymphs Abs: 1.3 10*3/uL (ref 0.7–4.0)
MCH: 26.9 pg (ref 26.0–34.0)
MCHC: 32.4 g/dL (ref 30.0–36.0)
MCV: 83.1 fL (ref 80.0–100.0)
Monocytes Absolute: 0.9 10*3/uL (ref 0.1–1.0)
Monocytes Relative: 12 %
Neutro Abs: 5.2 10*3/uL (ref 1.7–7.7)
Neutrophils Relative %: 70 %
Platelets: 130 10*3/uL — ABNORMAL LOW (ref 150–400)
RBC: 4.27 MIL/uL (ref 4.22–5.81)
RDW: 15.1 % (ref 11.5–15.5)
WBC: 7.4 10*3/uL (ref 4.0–10.5)
nRBC: 0 % (ref 0.0–0.2)

## 2020-05-21 LAB — PROTIME-INR
INR: 1.3 — ABNORMAL HIGH (ref 0.8–1.2)
Prothrombin Time: 15.9 seconds — ABNORMAL HIGH (ref 11.4–15.2)

## 2020-05-21 LAB — APTT: aPTT: 39 seconds — ABNORMAL HIGH (ref 24–36)

## 2020-05-21 MED ORDER — POTASSIUM CHLORIDE ER 10 MEQ PO TBCR
10.0000 meq | EXTENDED_RELEASE_TABLET | Freq: Every day | ORAL | 0 refills | Status: DC
Start: 2020-05-21 — End: 2020-08-16

## 2020-05-21 MED ORDER — ALBUTEROL SULFATE HFA 108 (90 BASE) MCG/ACT IN AERS
2.0000 | INHALATION_SPRAY | Freq: Once | RESPIRATORY_TRACT | Status: AC
  Administered 2020-05-21: 2 via RESPIRATORY_TRACT
  Filled 2020-05-21: qty 6.7

## 2020-05-21 MED ORDER — CEPHALEXIN 500 MG PO CAPS: 500.0000 mg | ORAL_CAPSULE | Freq: Four times a day (QID) | ORAL | 0 refills | Status: DC

## 2020-05-21 MED ORDER — LEVOFLOXACIN IN D5W 500 MG/100ML IV SOLN
500.0000 mg | Freq: Once | INTRAVENOUS | Status: AC
  Administered 2020-05-21: 500 mg via INTRAVENOUS
  Filled 2020-05-21: qty 100

## 2020-05-21 MED ORDER — POTASSIUM CHLORIDE CRYS ER 20 MEQ PO TBCR
40.0000 meq | EXTENDED_RELEASE_TABLET | Freq: Once | ORAL | Status: AC
  Administered 2020-05-21: 40 meq via ORAL
  Filled 2020-05-21: qty 2

## 2020-05-21 MED ORDER — POTASSIUM CHLORIDE 10 MEQ/100ML IV SOLN
10.0000 meq | Freq: Once | INTRAVENOUS | Status: AC
  Administered 2020-05-21: 10 meq via INTRAVENOUS
  Filled 2020-05-21: qty 100

## 2020-05-21 MED ORDER — SODIUM CHLORIDE 0.9 % IV BOLUS
2000.0000 mL | Freq: Once | INTRAVENOUS | Status: AC
  Administered 2020-05-21: 2000 mL via INTRAVENOUS

## 2020-05-21 NOTE — ED Provider Notes (Signed)
[facility] EMERGENCY DEPARTMENT Provider Note   CSN: [id number] Arrival date & time: 05/21/20  1749     History Chief Complaint  Patient presents with  . Shortness of Breath    Raymond Walker. is a 69 y.o. male.  Patient complains of some shortness of breath he has COPD and has run out of his medicines.  He also has been sitting outside and is tired  The history is provided by the patient and medical records. No language interpreter was used.  Shortness of Breath Severity:  Mild Onset quality:  Gradual Timing:  Constant Progression:  Waxing and waning Chronicity:  Recurrent Context: activity   Relieved by:  Nothing Worsened by:  Nothing Ineffective treatments:  None tried Associated symptoms: no abdominal pain, no chest pain, no cough, no headaches and no rash   Risk factors: no recent alcohol use        Past Medical History:  Diagnosis Date  . Caregiver with fatigue 05/02/2020  . COPD (chronic obstructive pulmonary disease) ([place])   . Erectile dysfunction   . Erectile dysfunction 05/02/2020  . History of kidney stones   . Hypertension   . Impaired glucose tolerance   . Pancreatitis, recurrent   . Thrombocytopenia ([place]) 08/10/2018   Staying in the low 100s will follow closely    Patient Active Problem List   Diagnosis Date Noted  . Erectile dysfunction 05/02/2020  . Caregiver with fatigue 05/02/2020  . 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 Goals of care, counseling/discussion   . Palliative care by specialist   . DNR (do not resuscitate)   . Vitamin D deficiency 01/24/2020  . Sepsis ([place]) 01/23/2020  . Altered mental status   . Hypokalemia   . History of kidney stones   . Hematuria   . UTI (urinary tract infection)   . CHRONIC Bladder outlet obstruction   . PAD (peripheral artery disease) ([place]) 09/15/2019  . Protein-calorie malnutrition, severe 08/21/2019  . 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[place])   . Cellulitis of right lower extremity   . Diabetic  foot ulcer with osteomyelitis ([place]) 08/19/2019  . Impaired glucose tolerance   . COPD (chronic obstructive pulmonary disease) ([place])   . Thrombocytopenia ([place]) 08/10/2018  . Aortic atherosclerosis ([place]) 10/14/2016  . Coronary artery calcification seen on CAT scan 10/14/2016  . Other emphysema ([place]) 10/14/2016  . Elevated blood pressure 12/03/2011  . Chest pain 12/03/2011  . Tobacco abuse 12/03/2011  . Right leg pain 12/03/2011    Past Surgical History:  Procedure Laterality Date  . ABDOMINAL AORTOGRAM W/LOWER EXTREMITY Bilateral 08/20/2019   Procedure: ABDOMINAL AORTOGRAM W/LOWER EXTREMITY;  Surgeon: Marty Heck, MD;  Location: [facility] [facility];  Service: Cardiovascular;  Laterality: Bilateral;  . AMPUTATION Right 09/15/2019   Procedure: AMPUTATION RIGHT TOES One, Two, And Three;  Surgeon: Waynetta Sandy, MD;  Location: [facility];  Service: Vascular;  Laterality: Right;  . CATARACT EXTRACTION W/PHACO Right 05/02/2018   Procedure: CATARACT EXTRACTION PHACO AND INTRAOCULAR LENS PLACEMENT (IOC);  Surgeon: Baruch Goldmann, MD;  Location: [facility];  Service: Ophthalmology;  Laterality: Right;  CDE: 11.11  . CATARACT EXTRACTION W/PHACO Left 07/04/2018   Procedure: CATARACT EXTRACTION PHACO AND INTRAOCULAR LENS PLACEMENT (IOC);  Surgeon: Baruch Goldmann, MD;  Location: [facility];  Service: Ophthalmology;  Laterality: Left;  CDE: 7.15  . CHOLECYSTECTOMY    . FEMORAL-POPLITEAL BYPASS GRAFT Right 09/15/2019   Procedure: Right BYPASS GRAFT FEMORAL to Above Knee POPLITEAL ARTERY;  Surgeon:  Waynetta Sandy, MD;  Location: [facility];  Service: Vascular;  Laterality: Right;  . FEMORAL-POPLITEAL BYPASS GRAFT Right 01/26/2020   Procedure: IRRIGATION AND DEBRIDEMENT RIGHT FEMORAL POPLITEAL BYPASS SITE;  Surgeon: Waynetta Sandy, MD;  Location: [facility];  Service: Vascular;  Laterality: Right;  . ORCHIECTOMY    . PERIPHERAL VASCULAR INTERVENTION Left 08/20/2019   Procedure: PERIPHERAL  VASCULAR INTERVENTION;  Surgeon: Marty Heck, MD;  Location: [facility];  Service: Cardiovascular;  Laterality: Left;  common/external iliac  . 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   Procedure: TRANSMETATARSAL AMPUTATION;  Surgeon: Waynetta Sandy, MD;  Location: [facility];  Service: Vascular;  Laterality: Right;  Marland Kitchen VASECTOMY         Family History  Problem Relation Age of Onset  . Hypertension Father   . Coronary artery disease Father   . Coronary artery disease Mother   . Cancer Mother        Breast    Social History   Tobacco Use  . Smoking status: Current Every Day Smoker    Packs/day: 1.50    Years: 30.00    Pack years: 45.00    Types: Cigarettes  . Smokeless tobacco: Never Used  . Tobacco comment: stopped on admission to hospital on 08/19/19  Substance Use Topics  . Alcohol use: No  . Drug use: No    Home Medications Prior to Admission medications   Medication Sig Start Date End Date Taking? Authorizing Provider  acetaminophen (TYLENOL) 500 MG tablet Take 500 mg by mouth every 8 (eight) hours as needed for moderate pain.     [provider]  amLODipine (NORVASC) 2.5 MG tablet Take 2.5 mg by mouth daily.    [provider]  amoxicillin-clavulanate (AUGMENTIN) 875-125 MG tablet Take 1 tablet by mouth 2 (two) times daily. TAKE WITH A SNACK 03/23/20   [name], MD  aspirin EC 81 MG EC tablet Take 1 tablet (81 mg total) by mouth daily. Patient not taking: Reported on 03/22/2020 08/29/19   Regalado, Jerald Kief A, MD  bisacodyl (DULCOLAX) 10 MG suppository Place 1 suppository (10 mg total) rectally daily as needed for severe constipation. Patient not taking: Reported on 03/22/2020 01/30/20   Aline August, MD  cephALEXin (KEFLEX) 500 MG capsule Take 1 capsule (500 mg total) by mouth 4 (four) times daily. 05/21/20   Milton Ferguson, MD  clopidogrel (PLAVIX) 75 MG tablet Take 1 tablet (75 mg total) by mouth daily with breakfast. 04/15/20    [name], MD  Multiple Vitamin (MULTIVITAMIN WITH MINERALS) TABS tablet Take 1 tablet by mouth daily. Patient not taking: Reported on 03/22/2020 08/29/19   Regalado, Jerald Kief A, MD  oxyCODONE-acetaminophen (PERCOCET/ROXICET) 5-325 MG tablet Take 1 tablet by mouth every 6 (six) hours as needed for moderate pain. Patient not taking: Reported on 03/22/2020 01/30/20   Aline August, MD  pantoprazole (PROTONIX) 40 MG tablet Take 1 tablet (40 mg total) by mouth daily. 05/13/20   [name], MD  polyethylene glycol (MIRALAX / GLYCOLAX) 17 g packet Take 17 g by mouth daily as needed. Patient not taking: Reported on 03/22/2020 01/30/20   Aline August, MD  potassium chloride (KLOR-CON) 10 MEQ tablet Take 1 tablet (10 mEq total) by mouth daily. 05/21/20   Milton Ferguson, MD  rosuvastatin (CRESTOR) 20 MG tablet Take 1 tablet (20 mg total) by mouth daily. 04/15/20   [name], MD  senna-docusate (SENOKOT-S) 8.6-50 MG tablet Take 1 tablet by mouth 2 (  two) times daily. Patient not taking: Reported on 03/22/2020 01/30/20   Aline August, MD  sildenafil (VIAGRA) 100 MG tablet Take one tablet po daily prn sexual relations Patient not taking: Reported on 03/22/2020 11/23/19   [name], MD  sucralfate (CARAFATE) 1 g tablet Take 1 tablet (1 g total) by mouth 4 (four) times daily -  with meals and at bedtime. 04/15/20   [name], MD  traZODone (DESYREL) 50 MG tablet Take 0.5-1 tablets (25-50 mg total) by mouth at bedtime as needed for sleep. Patient not taking: Reported on 03/22/2020 10/26/19   [name], MD  vitamin B-12 1000 MCG tablet Take 1 tablet (1,000 mcg total) by mouth daily. Patient not taking: Reported on 03/22/2020 01/30/20   Aline August, MD    Allergies    Patient has no known allergies.  Review of Systems   Review of Systems  Constitutional: Negative for appetite change and fatigue.  HENT: Negative for congestion, ear discharge and sinus pressure.   Eyes: Negative for discharge.   Respiratory: Positive for shortness of breath. Negative for cough.   Cardiovascular: Negative for chest pain.  Gastrointestinal: Negative for abdominal pain and diarrhea.  Genitourinary: Negative for frequency and hematuria.  Musculoskeletal: Negative for back pain.  Skin: Negative for rash.  Neurological: Negative for seizures and headaches.  Psychiatric/Behavioral: Negative for hallucinations.    Physical Exam Updated Vital Signs BP 105/80   Pulse 73   Temp 100.1 F (37.8 C) (Oral)   Resp 19   Ht 6' (1.829 m)   Wt 64.4 kg   SpO2 96%   BMI 19.26 kg/m   Physical Exam Vitals and nursing note reviewed.  Constitutional:      Appearance: He is well-developed.  HENT:     Head: Normocephalic.     Right Ear: Tympanic membrane normal.     Nose: Nose normal.  Eyes:     General: No scleral icterus.    Conjunctiva/sclera: Conjunctivae normal.  Neck:     Thyroid: No thyromegaly.  Cardiovascular:     Rate and Rhythm: Normal rate and regular rhythm.     Heart sounds: No murmur. No friction rub. No gallop.   Pulmonary:     Breath sounds: No stridor. Wheezing present. No rales.  Chest:     Chest wall: No tenderness.  Abdominal:     General: There is no distension.     Tenderness: There is no abdominal tenderness. There is no rebound.  Musculoskeletal:        General: Normal range of motion.     Cervical back: Neck supple.  Lymphadenopathy:     Cervical: No cervical adenopathy.  Skin:    Findings: No erythema or rash.  Neurological:     Mental Status: He is alert and oriented to person, place, and time.     Motor: No abnormal muscle tone.     Coordination: Coordination normal.  Psychiatric:        Behavior: Behavior normal.     ED Results / Procedures / Treatments   Labs (all labs ordered are listed, but only abnormal results are displayed) Labs Reviewed  COMPREHENSIVE METABOLIC PANEL - Abnormal; Notable for the following components:      Result Value   Sodium 134  (*)    Potassium 2.8 (*)    Chloride 96 (*)    Glucose, Bld 115 (*)    Calcium 8.6 (*)    Albumin 3.3 (*)    AST  10 (*)    All other components within normal limits  CBC WITH DIFFERENTIAL/PLATELET - Abnormal; Notable for the following components:   Hemoglobin 11.5 (*)    HCT 35.5 (*)    Platelets 130 (*)    All other components within normal limits  APTT - Abnormal; Notable for the following components:   aPTT 39 (*)    All other components within normal limits  PROTIME-INR - Abnormal; Notable for the following components:   Prothrombin Time 15.9 (*)    INR 1.3 (*)    All other components within normal limits  URINALYSIS, ROUTINE W REFLEX MICROSCOPIC - Abnormal; Notable for the following components:   Hgb urine dipstick LARGE (*)    Leukocytes,Ua TRACE (*)    All other components within normal limits  CULTURE, BLOOD (ROUTINE X 2)  CULTURE, BLOOD (ROUTINE X 2)  SARS CORONAVIRUS 2 BY RT PCR (HOSPITAL ORDER, [facility])  URINE CULTURE  LACTIC ACID, PLASMA  LACTIC ACID, PLASMA    EKG None  Radiology DG Chest Port 1 View  Result Date: 05/21/2020 CLINICAL DATA:  Chronic shortness of breath, worse today, febrile EXAM: PORTABLE CHEST 1 VIEW COMPARISON:  01/23/2020, 01/25/2020 FINDINGS: 2 frontal views of the chest demonstrate an unremarkable cardiac silhouette. There is background emphysema. There is a 1.1 cm spiculated nodule the right lung base, corresponding to the nodularity seen on prior CT. No acute airspace disease, effusion, or pneumothorax. No acute bony abnormality. IMPRESSION: 1. Emphysema, no acute airspace disease. 2. 1.1 cm spiculated right basilar nodule, corresponding to abnormality seen on prior CT scan. Nonemergent follow-up CT chest may be useful for further evaluation. Electronically Signed   By: Randa Ngo M.D.   On: 05/21/2020 18:45    Procedures Procedures (including critical care time)  Medications Ordered in ED Medications   sodium chloride 0.9 % bolus 2,000 mL (0 mLs Intravenous Stopped 05/21/20 2125)  levofloxacin (LEVAQUIN) IVPB 500 mg (0 mg Intravenous Stopped 05/21/20 1958)  potassium chloride SA (KLOR-CON) CR tablet 40 mEq (40 mEq Oral Given 05/21/20 1957)  potassium chloride 10 mEq in 100 mL IVPB (0 mEq Intravenous Stopped 05/21/20 2125)  albuterol (VENTOLIN HFA) 108 (90 Base) MCG/ACT inhaler 2 puff (2 puffs Inhalation Given 05/21/20 1957)    ED Course  I have reviewed the triage vital signs and the nursing notes.  Pertinent labs & imaging results that were available during my care of the patient were reviewed by me and considered in my medical decision making (see chart for details).    MDM Rules/Calculators/A&P                      Patient with mild COPD exacerbation possible urinary tract infection.  He is placed on Keflex.  He also has hypokalemia and is started on potassium and is given albuterol inhaler       This patient presents to the ED for concern of sob this involves an extensive number of treatment options, and is a complaint that carries with it a high risk of complications and morbidity.  The differential diagnosis includes COPD exacerbation pneumonia   Lab Tests:   I Ordered, reviewed, and interpreted labs, which included CBC and chemistries that showed hypokalemia.  And anemia, urinalysis showed UTI  Medicines ordered:   I ordered medication: Antibiotics for uti and albuterol for sob  Imaging Studies ordered:   I ordered imaging studies which included chest x-ray and  I independently visualized  and interpreted imaging which showed copd  Additional history obtained:   Additional history obtained from records  Previous records obtained and reviewed  Consultations Obtained:   Reevaluation:  After the interventions stated above, I reevaluated the patient and found improved see  Critical Interventions:  .   Final Clinical Impression(s) / ED Diagnoses Final  diagnoses:  Acute cystitis with hematuria  COPD exacerbation ([place])  Hypokalemia    Rx / DC Orders ED Discharge Orders         Ordered    cephALEXin (KEFLEX) 500 MG capsule  4 times daily     05/21/20 2227    potassium chloride (KLOR-CON) 10 MEQ tablet  Daily     05/21/20 2228           Milton Ferguson, MD 05/23/20 1154

## 2020-05-21 NOTE — Discharge Instructions (Signed)
Drink plenty of fluids.  Use that albuterol inhaler every 4-6 hours for shortness of breath.  Follow-up with the family doctor next week

## 2020-05-21 NOTE — ED Triage Notes (Signed)
Patient presents to the ED with complaints of having shortness of breath earlier today. Patient is reported to have been found by EMS sitting outside in the hot sun and stated he " felt cold".  EMS reports all vital signs WDL.  Patient denies pain or shortness of breath at this time.

## 2020-05-23 ENCOUNTER — Telehealth: Payer: Self-pay | Admitting: Family Medicine

## 2020-05-23 ENCOUNTER — Ambulatory Visit: Payer: Medicare Other | Admitting: *Deleted

## 2020-05-23 LAB — URINE CULTURE: Culture: <10000 — AB

## 2020-05-23 NOTE — Telephone Encounter (Signed)
Pt is needing a letter of recommendation to be able to move in to independent living at [facility] [address]. Letting them know if he is capable to be on his own.

## 2020-05-23 NOTE — Telephone Encounter (Signed)
Please advise. Thank you

## 2020-05-24 ENCOUNTER — Encounter: Payer: Self-pay | Admitting: *Deleted

## 2020-05-24 ENCOUNTER — Other Ambulatory Visit: Payer: Self-pay | Admitting: *Deleted

## 2020-05-24 NOTE — Patient Outreach (Signed)
[facility] Care Management  05/24/2020  [place] Churchill. 25-Aug-1951 [id number]  CSW was able to make initial contact with patient's niece, [name] today to perform the phone assessment on patient, as well as assess and assist with social work needs and services.  CSW introduced self, explained role and types of services provided through [facility] ([facility]).  CSW further explained to [name] that CSW works with patient's RNCM, also with [facility], [name].  CSW then explained the reason for the call, indicating that Mrs. Orene Desanctis thought that patient would benefit from social work services and resources to assist with residential placement, as well as counseling and supportive services for symptoms of depression.  CSW obtained two HIPAA compliant identifiers from [name], which included patient's name and date of birth.  In talking with [name], she reported that she has actually found an independent living low-income apartment for patient at [facility], and that she will be meeting with the admissions director tomorrow (Wednesday, May 25, 2020) to complete paperwork and sign patient into their independent living community.  [name] went on to say that [employer] has a Therapist, nutritional; therefore, Mrs. [name] has already arranged for patient to receive home health nursing, physical therapy, occupational therapy, aide services and a licensed clinical social worker to provide counseling services, all through this one agency.  Last, Mrs. [name] has placed patient on the waiting list for [employer], through [employer] [place].  [name] admitted that patient is able to perform all activities of daily living independently, and that he is still able to drive himself to and from all of his physician appointments, but that she always meets him at his  appointments to observe what is being said.  [name] reported that she will continue to set up patient's monthly pill box, as well as confirm that patient is taking his prescription medications exactly as prescribed.  CSW will perform a case closure on patient, as all goals of treatment have been met from social work standpoint and no additional social work needs have been identified at this time.  CSW will notify Mrs. Lane of CSW's plans to close patient's case.  CSW will fax an update to patient's Primary Care Physician, Dr. Sallee Lange to ensure that he is aware of CSW's involvement with patient's plan of care.    Nat Christen, BSW, MSW, LCSW  Licensed Education officer, [facility]  Mailing [place] [address], [place], [place] [address] Physical Address-[address], [place],  [address] Toll Free Main # [phone] Fax # [phone] Cell # [phone]  Office # [phone] [email or web address]

## 2020-05-25 ENCOUNTER — Encounter: Payer: Self-pay | Admitting: Family Medicine

## 2020-05-25 NOTE — Telephone Encounter (Signed)
A letter was dictated as requested please connect with patient and provide letter

## 2020-05-26 LAB — CULTURE, BLOOD (ROUTINE X 2)
Culture: NO GROWTH
Culture: NO GROWTH
Special Requests: ADEQUATE
Special Requests: ADEQUATE

## 2020-05-28 ENCOUNTER — Other Ambulatory Visit
Admission: RE | Admit: 2020-05-28 | Discharge: 2020-05-28 | Disposition: A | Discharge status: Home / Self Care | Payer: Self-pay | Source: Ambulatory Visit | Attending: Nurse Practitioner | Admitting: Nurse Practitioner

## 2020-05-28 DIAGNOSIS — Z7689 Persons encountering health services in other specified circumstances: Secondary | ICD-10-CM | POA: Diagnosis not present

## 2020-05-28 DIAGNOSIS — R319 Hematuria, unspecified: Secondary | ICD-10-CM | POA: Diagnosis not present

## 2020-05-28 DIAGNOSIS — I739 Peripheral vascular disease, unspecified: Secondary | ICD-10-CM | POA: Diagnosis not present

## 2020-05-28 DIAGNOSIS — R3911 Hesitancy of micturition: Secondary | ICD-10-CM | POA: Diagnosis not present

## 2020-05-28 LAB — CBC WITH DIFFERENTIAL/PLATELET
Abs Immature Granulocytes: 0.01 10*3/uL (ref 0.00–0.07)
Basophils Absolute: 0 10*3/uL (ref 0.0–0.1)
Basophils Relative: 1 %
Eosinophils Absolute: 0.1 10*3/uL (ref 0.0–0.5)
Eosinophils Relative: 2 %
HCT: 39.8 % (ref 39.0–52.0)
Hemoglobin: 13.3 g/dL (ref 13.0–17.0)
Immature Granulocytes: 0 %
Lymphocytes Relative: 33 %
Lymphs Abs: 2.1 10*3/uL (ref 0.7–4.0)
MCH: 26.8 pg (ref 26.0–34.0)
MCHC: 33.4 g/dL (ref 30.0–36.0)
MCV: 80.1 fL (ref 80.0–100.0)
Monocytes Absolute: 0.6 10*3/uL (ref 0.1–1.0)
Monocytes Relative: 10 %
Neutro Abs: 3.5 10*3/uL (ref 1.7–7.7)
Neutrophils Relative %: 54 %
Platelets: 245 10*3/uL (ref 150–400)
RBC: 4.97 MIL/uL (ref 4.22–5.81)
RDW: 15.1 % (ref 11.5–15.5)
WBC: 6.3 10*3/uL (ref 4.0–10.5)
nRBC: 0 % (ref 0.0–0.2)

## 2020-05-28 LAB — URINALYSIS, ROUTINE W REFLEX MICROSCOPIC
Bilirubin Urine: NEGATIVE
Glucose, UA: NEGATIVE mg/dL
Ketones, ur: NEGATIVE mg/dL
Nitrite: NEGATIVE
Protein, ur: 30 mg/dL — AB
RBC / HPF: >50 RBC/hpf — ABNORMAL HIGH (ref 0–5)
Specific Gravity, Urine: 1.01 (ref 1.005–1.030)
Squamous Epithelial / HPF: NONE SEEN (ref 0–5)
WBC, UA: >50 WBC/hpf — ABNORMAL HIGH (ref 0–5)
pH: 6 (ref 5.0–8.0)

## 2020-05-31 ENCOUNTER — Encounter: Payer: Self-pay | Admitting: Family Medicine

## 2020-05-31 ENCOUNTER — Ambulatory Visit (INDEPENDENT_AMBULATORY_CARE_PROVIDER_SITE_OTHER): Payer: Medicare Other | Admitting: Family Medicine

## 2020-05-31 ENCOUNTER — Other Ambulatory Visit: Payer: Self-pay

## 2020-05-31 VITALS — BP 132/84 | Temp 97.9°F | Wt 139.0 lb

## 2020-05-31 DIAGNOSIS — R7303 Prediabetes: Secondary | ICD-10-CM | POA: Diagnosis not present

## 2020-05-31 DIAGNOSIS — E876 Hypokalemia: Secondary | ICD-10-CM

## 2020-05-31 DIAGNOSIS — R911 Solitary pulmonary nodule: Secondary | ICD-10-CM | POA: Diagnosis not present

## 2020-05-31 DIAGNOSIS — L97511 Non-pressure chronic ulcer of other part of right foot limited to breakdown of skin: Secondary | ICD-10-CM | POA: Diagnosis not present

## 2020-05-31 DIAGNOSIS — Z87898 Personal history of other specified conditions: Secondary | ICD-10-CM

## 2020-05-31 DIAGNOSIS — R300 Dysuria: Secondary | ICD-10-CM

## 2020-05-31 DIAGNOSIS — I739 Peripheral vascular disease, unspecified: Secondary | ICD-10-CM

## 2020-05-31 DIAGNOSIS — J438 Other emphysema: Secondary | ICD-10-CM | POA: Diagnosis not present

## 2020-05-31 DIAGNOSIS — D179 Benign lipomatous neoplasm, unspecified: Secondary | ICD-10-CM

## 2020-05-31 HISTORY — DX: Personal history of other specified conditions: Z87.898

## 2020-05-31 LAB — POCT URINALYSIS DIPSTICK
Spec Grav, UA: 1.01 (ref 1.010–1.025)
pH, UA: 7 (ref 5.0–8.0)

## 2020-05-31 MED ORDER — MUPIROCIN 2 % EX OINT: TOPICAL_OINTMENT | CUTANEOUS | 0 refills | Status: DC

## 2020-05-31 NOTE — Progress Notes (Signed)
Subjective:    Patient ID: Raymond Radon., male    DOB: 1951-12-05, 69 y.o.   MRN: [id number]  HPI  Patient arrives for a follow up on recent hospitalization for low potassium and UTI.  Patient has a large amount of issues going on Very nice gentleman who is trying hard His niece helps him out They are in the process of getting him set up into his own apartment Recently he was in the hospital had some blood in the urine as well as low potassium They placed him on potassium for 10 days He also relates he has a sore on his right foot that causes him some soreness and tenderness has a history of partial amputation Has multiple upcoming appointments with vascular  Review of Systems Occasionally gets short of breath and uses an inhaler for this patient is a longtime smoker and has been counseled to quit he states he might try nicotine gum Chantix is not an option for this patient and Wellbutrin is not because of history of seizures    Objective:   Physical Exam Lungs are clear respiratory rate normal heart regular no murmurs extremities no edema foot ulcer noted on the right foot partial amputation skin warm dry  Results for orders placed or performed in visit on 05/31/20  POCT urinalysis dipstick  Result Value Ref Range   Color, UA     Clarity, UA     Glucose, UA     Bilirubin, UA     Ketones, UA     Spec Grav, UA 1.010 1.010 - 1.025   Blood, UA moderate    pH, UA 7.0 5.0 - 8.0   Protein, UA     Urobilinogen, UA     Nitrite, UA     Leukocytes, UA Trace (A) Negative   Appearance     Odor          Assessment & Plan:  1. Other emphysema ([place]) Emphysema seen on CAT scan back in February very important for patient to quit smoking he does use inhalers to help him with shortness of breath but does not need to have a steroid inhaler on a daily basis at this point  2. Pulmonary nodule Pulmonary nodule seen on CT scan back in February while he was in the hospital recommended  to do a follow-up CT scan nodule seen on chest x-ray 1.1 cm concerning for the possibility of neoplasm very important to do CT scan - CT Chest Wo Contrast  3. Ulcer of right foot, limited to breakdown of skin ([place]) Ulcer of the right foot breakdown in the skin referral to podiatry as well as wound care center he has appointment with vascular coming up later in July - Ambulatory referral to Podiatry - AMB referral to wound care center  4. PAD (peripheral artery disease) (HCC) History of peripheral artery disease denies claudication currently I do not see any signs of necrosis or ischemia currently  5. Prediabetes Check A1c await the results - Hemoglobin A1c - Magnesium  6. Hypokalemia Check potassium await the results - Basic metabolic panel - Magnesium  7. Solitary pulmonary nodule CT scan as described above very important to get done - CT Chest Wo Contrast  8. Dysuria Urine culture pending does have some blood in the urine if culture negative needs to see urology - POCT urinalysis dipstick - Urine Culture  9. History of seizures Not a candidate for Wellbutrin for smoking he will try nicotine gum  10. Lipoma, unspecified site Lipoma on his upper back no surgical intervention necessary  He also has a sebaceous cyst on the upper part part of the back near the neck not infected leave alone for now  Recheck 1 month

## 2020-06-01 ENCOUNTER — Telehealth: Payer: Self-pay | Admitting: Family Medicine

## 2020-06-01 LAB — BASIC METABOLIC PANEL
BUN/Creatinine Ratio: 19 (ref 10–24)
BUN: 15 mg/dL (ref 8–27)
CO2: 22 mmol/L (ref 20–29)
Calcium: 9.9 mg/dL (ref 8.6–10.2)
Chloride: 99 mmol/L (ref 96–106)
Creatinine, Ser: 0.81 mg/dL (ref 0.76–1.27)
GFR calc Af Amer: 104 mL/min/{1.73_m2} (ref >59)
GFR calc non Af Amer: 90 mL/min/{1.73_m2} (ref >59)
Glucose: 97 mg/dL (ref 65–99)
Potassium: 4.5 mmol/L (ref 3.5–5.2)
Sodium: 139 mmol/L (ref 134–144)

## 2020-06-01 LAB — HEMOGLOBIN A1C
Est. average glucose Bld gHb Est-mCnc: 128 mg/dL
Hgb A1c MFr Bld: 6.1 % — ABNORMAL HIGH (ref 4.8–5.6)

## 2020-06-01 LAB — MAGNESIUM: Magnesium: 1.8 mg/dL (ref 1.6–2.3)

## 2020-06-01 NOTE — Telephone Encounter (Signed)
If the nodule just requires a 12 month follow up based on the scan you ordered we will do the [facility] and then do the annual scan 12 months after the scan you are doing now. Will that work? Just let us know.

## 2020-06-01 NOTE — Telephone Encounter (Signed)
HI Raymond Walker-patient had recent chest x-ray in the ER which shows a nodule on the right lower lung. (There was a much smaller nodule present on a CAT scan at the end of February in the ER) patient will need a formal CT scan of the lung.  Therefore we are ordering a CT scan of the lung.  I assume that at the present moment until this issue gets figured out that a shared decision making visit with you and low-dose CT scan no longer makes sense.  Please let me know if you agree.  Therefore our office will pursue setting up standard CT scan to look at this nodule Thanks for your Ardelle Lesches MD family medicine

## 2020-06-02 LAB — URINE CULTURE: Organism ID, Bacteria: NO GROWTH

## 2020-06-02 NOTE — Telephone Encounter (Signed)
The CT we ordered is for 6/24 The one y'all ordered is still on schedule for 6/23 Can y'all cancel that one ( I can't cancel bcz I did not order that one I will include you in the follow up of the results in the one I ordered Thanks Again

## 2020-06-03 NOTE — Telephone Encounter (Signed)
We will cancel the scan dated 6/23.   Triage, please cancel CT chest scheduled for 6/23. The patient is getting a scan 6/24 per Dr. Wolfgang Phoenix. Thanks so much all!

## 2020-06-03 NOTE — Telephone Encounter (Signed)
Can you please cancel this?

## 2020-06-08 ENCOUNTER — Ambulatory Visit (HOSPITAL_COMMUNITY): Payer: Medicare Other

## 2020-06-08 ENCOUNTER — Encounter: Payer: Medicare Other | Admitting: Acute Care

## 2020-06-09 ENCOUNTER — Ambulatory Visit (HOSPITAL_COMMUNITY)
Admission: RE | Admit: 2020-06-09 | Discharge: 2020-06-09 | Disposition: A | Discharge status: Home / Self Care | Payer: Medicare Other | Source: Ambulatory Visit | Attending: Family Medicine | Admitting: Family Medicine

## 2020-06-09 ENCOUNTER — Other Ambulatory Visit: Payer: Self-pay

## 2020-06-09 DIAGNOSIS — R911 Solitary pulmonary nodule: Secondary | ICD-10-CM | POA: Diagnosis not present

## 2020-06-14 ENCOUNTER — Encounter: Payer: Self-pay | Admitting: Family Medicine

## 2020-06-17 ENCOUNTER — Other Ambulatory Visit: Payer: Self-pay

## 2020-06-17 ENCOUNTER — Encounter: Payer: Self-pay | Admitting: Podiatry

## 2020-06-17 ENCOUNTER — Ambulatory Visit (INDEPENDENT_AMBULATORY_CARE_PROVIDER_SITE_OTHER): Payer: Medicare Other

## 2020-06-17 ENCOUNTER — Telehealth: Payer: Self-pay | Admitting: Family Medicine

## 2020-06-17 ENCOUNTER — Other Ambulatory Visit: Payer: Self-pay | Admitting: *Deleted

## 2020-06-17 ENCOUNTER — Ambulatory Visit (INDEPENDENT_AMBULATORY_CARE_PROVIDER_SITE_OTHER): Payer: Medicare Other | Admitting: Podiatry

## 2020-06-17 DIAGNOSIS — I739 Peripheral vascular disease, unspecified: Secondary | ICD-10-CM

## 2020-06-17 DIAGNOSIS — L97512 Non-pressure chronic ulcer of other part of right foot with fat layer exposed: Secondary | ICD-10-CM

## 2020-06-17 DIAGNOSIS — R911 Solitary pulmonary nodule: Secondary | ICD-10-CM

## 2020-06-17 DIAGNOSIS — F17200 Nicotine dependence, unspecified, uncomplicated: Secondary | ICD-10-CM

## 2020-06-17 DIAGNOSIS — L97519 Non-pressure chronic ulcer of other part of right foot with unspecified severity: Secondary | ICD-10-CM

## 2020-06-17 DIAGNOSIS — M216X1 Other acquired deformities of right foot: Secondary | ICD-10-CM

## 2020-06-17 DIAGNOSIS — M21861 Other specified acquired deformities of right lower leg: Secondary | ICD-10-CM

## 2020-06-17 DIAGNOSIS — Z89431 Acquired absence of right foot: Secondary | ICD-10-CM | POA: Diagnosis not present

## 2020-06-17 MED ORDER — SANTYL 250 UNIT/GM EX OINT
1.0000 | TOPICAL_OINTMENT | Freq: Every day | CUTANEOUS | 0 refills | Status: DC
Start: 2020-06-17 — End: 2020-10-04

## 2020-06-17 NOTE — Patient Outreach (Signed)
[facility]) Care Management  06/17/2020  Raymond Walker. June 13, 1951 [id number]   Call placed to member's niece to follow up on management of his conditions.  She report he is doing well, will move into his own apartment within the next week.  [facility] has been working with them to determine the needs once he is independent, will plan to have PCS through [name].  Noted per chart, order from PCP has already been requested for assessment.  He is being seen at the foot center for the wound, next appointment this afternoon.  He also has appointments scheduled with vascular on 7/14 and with PCP on 7/15.  She will continue to accompany him to MD appointments for support.  Denies any urgent concerns, advised to contact this care manager with question.  Will follow up within the next month.  Goals Addressed              This Visit's Progress   .  Move into my own place (pt-stated)        [place] (see longitudinal plan of care for additional care plan information)  Current Barriers:  . Literacy barriers . Cognitive Deficits  Nurse Case Manager Clinical Goal(s):  Marland Kitchen Over the next 14 days, patient will attend all scheduled medical appointments: PCP, Podiatrist, Vascular . Over the next 31 days, patient will demonstrate improved health management independence as evidenced bymoving into his own apartment . Over the next 14 days, patient will work with Chief of Staff (community agency) to increase in home support  Interventions:  . Inter-disciplinary care team collaboration (see longitudinal plan of care) . Collaborated with [facility]  regarding additional community needs . Discussed plans with patient for ongoing care management follow up and provided patient with direct contact information for care management team . Provided patient and/or caregiver with aide information about [facility] (community resource). . Reviewed scheduled/upcoming  provider appointments including: Vascular on 7/14, podiatry on 7/2, and PCP on 7/15  Patient Self Care Activities:  . Attends all scheduled provider appointments . Performs ADL's independently . Performs IADL's independently . Lacks social connections  Initial goal documentation       Raymond Walker, Therapist, sports, MSN [place] [phone]

## 2020-06-17 NOTE — Telephone Encounter (Signed)
Raymond Walker, medical social worker with remote healthcare, calling to say that Raymond Walker is moving into his own apartment and she thinks he would benefit from personal care services.  She said that [name] will provide these services and Medicaid will pay if we fill out a form [id number] and fax it to [facility].  She can be reached at [phone]. *Blank DMA [id number] form at the nurses station if needed.*

## 2020-06-17 NOTE — Progress Notes (Signed)
Subjective:  Patient ID: Raymond Walker., male    DOB: 06-12-1951,  MRN: [id number]  Chief Complaint  Patient presents with  . Foot Ulcer    foot ulcer right foot      69 y.o. male presents with the above complaint. History confirmed with patient.  He presents today here with his daughter.  He has a history of severe peripheral arterial disease, as well as a significant smoking history for 1 pack/day smoker for over 30 years.  He is well-known to Dr. Servando Snare with vascular surgery.  He initially underwent he initially underwent angiography with intervention in early September 2020 when he was developing gangrenous changes of his first second and third toes.  He later underwent open vascular lower extremity bypass surgery later that month.  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 Donzetta Matters.  The amputation site healed well and since that time he has noticed some soreness and pain in the outside of the front of the foot.  He developed an ulceration around April of this year and has been draining since that time.  Objective:  Physical Exam: warm, good capillary refill, PT reduced right and palpable DP pulse right.  Right Foot: Healed transmetatarsal amputation.  Fifth ray is more prominent.  Plantar to the fifth ray distally there is a full-thickness ulceration with a fibrogranular base.  Mild serous drainage.  Mild hyperkeratosis around the wound.  No cellulitis or purulence.  No malodor.  Strength 5 out of 5 in all planes.  Prominent TA tendon noted.  Varus forefoot noted.  Equinus noted with no improvement with knee flexion.          No images are attached to the encounter.  Radiographs: Weightbearing radiographs of the right foot were taken with the right fifth  metatarsal being the longest of the remaining partial rays.  No evidence of osteomyelitis on radiographs. Assessment:   1. Ulcer of right foot with fat layer exposed ([place])   2. 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 Acquired forefoot varus of right foot   4. Status post transmetatarsal amputation of foot, right ([place])   5. Peripheral arterial disease ([place])   6. Tobacco use disorder      Plan:  Patient was evaluated and treated and all questions answered.  I  had a long discussion with the patient and his daughter today about his recent medical history, his right foot ulceration, and the position of the foot on the right side.  He appears to have had an impressive revascularization given his palpable dorsalis pedis pulse today warm skin temperature and brisk capillary refill.  Healed his transmetatarsal amputation quite well.  I do have concerns that the length of the remaining portions of the rays, especially the fifth ray, the varus positioning of the forefoot, as well as his posterior equinus and prominent TA tendon pull have contributed to significant pressure under the residual fifth metatarsal head.  Clinically his ulcer does not appear infected.  I think he would benefit from revisional transmetatarsal amputation with remodeling of the remaining metatarsals, open tendo Achilles lengthening, and tibialis anterior tendon transfer to the intermediate or lateral cuneiform.  We discussed the risk, benefits, and complications of the above procedure as well as the expected postoperative  course and outcomes.  I would expect to excise the ulcerated skin at the time of the surgery so he would no longer have an ulcer here.  I expect prolonged period of nonweightbearing for 4-6 weeks while the tendon lengthening and transfers heal with immobilization in a posterior splint and a fracture boot, followed by physical therapy and rehab.  While they were in the office today, his daughter received  a phone call from his primary care doctor's office that his recent CT scan of his lungs showed a concerning nodule in the would like to repeat a CT scan.  He does have a complex medical history and all he would benefit from the above procedure that we discussed, we can delay this so that he is more medically stabilized.  I also talked him about his smoking use and have asked him to reduce his smoking as much as possible preoperatively to reduce his risk of wound complications.  I will discuss the above with Dr. Donzetta Matters and his PCP Dr. Wolfgang Phoenix, he is due to see them both in a few weeks and he will see me again after to plan surgery.  In the interim, we will continue with the below:  -Continue local wound care.  Iodosorb and a foam border dressing were applied today -Santyl ordered for home care -Home wound care supplies ordered -We did not require debridement today we will reevaluate this in 3 weeks at his next appointment -Discussed signs symptoms of infection, he will notify the office immediately or present to the emergency room if any of these develop.  Lanae Crumbly, DPM 06/17/2020    Return in about 3 weeks (around 07/08/2020).

## 2020-06-17 NOTE — Progress Notes (Signed)
Error

## 2020-06-17 NOTE — Telephone Encounter (Signed)
Form in provider office. Please advise. Thank you. 

## 2020-06-21 NOTE — Telephone Encounter (Signed)
These fill out this form as best as you can then I will add to it thank you

## 2020-06-22 NOTE — Telephone Encounter (Signed)
Additional information added to forms. Form in provider office for review. Please advise. Thank you

## 2020-06-23 ENCOUNTER — Encounter: Payer: Self-pay | Admitting: Family Medicine

## 2020-06-23 ENCOUNTER — Other Ambulatory Visit: Payer: Self-pay | Admitting: *Deleted

## 2020-06-23 DIAGNOSIS — F09 Unspecified mental disorder due to known physiological condition: Secondary | ICD-10-CM | POA: Insufficient documentation

## 2020-06-23 DIAGNOSIS — I739 Peripheral vascular disease, unspecified: Secondary | ICD-10-CM

## 2020-06-23 NOTE — Telephone Encounter (Signed)
The form was filled out thank you

## 2020-06-29 ENCOUNTER — Ambulatory Visit (HOSPITAL_COMMUNITY)
Admission: RE | Admit: 2020-06-29 | Discharge: 2020-06-29 | Disposition: A | Discharge status: Home / Self Care | Payer: Medicare Other | Source: Ambulatory Visit | Attending: Surgery | Admitting: Surgery

## 2020-06-29 ENCOUNTER — Other Ambulatory Visit: Payer: Self-pay

## 2020-06-29 ENCOUNTER — Ambulatory Visit (INDEPENDENT_AMBULATORY_CARE_PROVIDER_SITE_OTHER)
Admission: RE | Admit: 2020-06-29 | Discharge: 2020-06-29 | Disposition: A | Discharge status: Home / Self Care | Payer: Medicare Other | Source: Ambulatory Visit | Attending: Surgery | Admitting: Surgery

## 2020-06-29 ENCOUNTER — Ambulatory Visit (INDEPENDENT_AMBULATORY_CARE_PROVIDER_SITE_OTHER): Payer: Medicare Other | Admitting: Physician Assistant

## 2020-06-29 VITALS — BP 142/89 | HR 81 | Temp 98.1°F | Resp 20 | Ht 72.0 in | Wt 140.9 lb

## 2020-06-29 DIAGNOSIS — I739 Peripheral vascular disease, unspecified: Secondary | ICD-10-CM

## 2020-06-29 NOTE — Progress Notes (Signed)
HISTORY AND PHYSICAL     CC:  follow up. Requesting Provider:  [name], MD  HPI: This is a 69 y.o. male who is here today for follow up.  He underwent right CFA to AK popliteal bypass with PTFE and amputation of right toes 1-3 by Dr. Donzetta Matters on 09/15/2019.    He subsequently developed a necrosis of the right 4th and 5th toes and fluid collection of the right thigh saphenectomy site.  He underwent exploration of the right AK popliteal anastomosis, I&D right medial thigh abscess and transmetatarsal amputation on 01/26/2020 by Dr. Donzetta Matters.  His RLE infection did not communicate to his graft.     He was last seen on 02/19/2020 by Dr. Donzetta Matters and at that time, he was still on abx and was doing well with wet to dry dressing changes in the right thigh.  The right AK incision had healed.  He was scheduled to come back in 3 months for follow up with vascular studies.   The pt returns today for follow up and is accompanied by his niece who is his POA.  He does have a wound on the right lateral aspect of the foot laterally.  He states that he has been seen by Dr. Sherryle Lis and he wants to revise the TMA and at the same time excise the ulcer.  He states that he wears a big bulky bandage and it does not hurt when he walks.  He states he does have pain across the area that was amputated.  He states he does not have any issues with the left foot.    The pt is on a statin for cholesterol management.    The pt is on an aspirin.    Other AC:  Plavix The pt is not on meds for hypertension.  The pt does not have diabetes. Tobacco hx:  current  Pt does not have family hx of AAA.  Past Medical History:  Diagnosis Date  . Caregiver with fatigue 05/02/2020  . COPD (chronic obstructive pulmonary disease) ([place])   . Erectile dysfunction   . Erectile dysfunction 05/02/2020  . History of kidney stones   . History of seizures 05/31/2020   Between ages 67 and 14  . Hypertension   . Impaired glucose tolerance   .  Pancreatitis, recurrent   . Thrombocytopenia ([place]) 08/10/2018   Staying in the low 100s will follow closely    Past Surgical History:  Procedure Laterality Date  . ABDOMINAL AORTOGRAM W/LOWER EXTREMITY Bilateral 08/20/2019   Procedure: ABDOMINAL AORTOGRAM W/LOWER EXTREMITY;  Surgeon: Marty Heck, MD;  Location: [facility];  Service: Cardiovascular;  Laterality: Bilateral;  . AMPUTATION Right 09/15/2019   Procedure: AMPUTATION RIGHT TOES One, Two, And Three;  Surgeon: Waynetta Sandy, MD;  Location: [facility];  Service: Vascular;  Laterality: Right;  . CATARACT EXTRACTION W/PHACO Right 05/02/2018   Procedure: CATARACT EXTRACTION PHACO AND INTRAOCULAR LENS PLACEMENT (IOC);  Surgeon: Baruch Goldmann, MD;  Location: [facility];  Service: Ophthalmology;  Laterality: Right;  CDE: 11.11  . CATARACT EXTRACTION W/PHACO Left 07/04/2018   Procedure: CATARACT EXTRACTION PHACO AND INTRAOCULAR LENS PLACEMENT (IOC);  Surgeon: Baruch Goldmann, MD;  Location: [facility];  Service: Ophthalmology;  Laterality: Left;  CDE: 7.15  . CHOLECYSTECTOMY    . FEMORAL-POPLITEAL BYPASS GRAFT Right 09/15/2019   Procedure: Right BYPASS GRAFT FEMORAL to Above Knee POPLITEAL ARTERY;  Surgeon: Waynetta Sandy, MD;  Location: [facility];  Service: Vascular;  Laterality: Right;  .  FEMORAL-POPLITEAL BYPASS GRAFT Right 01/26/2020   Procedure: IRRIGATION AND DEBRIDEMENT RIGHT FEMORAL POPLITEAL BYPASS SITE;  Surgeon: Waynetta Sandy, MD;  Location: [facility];  Service: Vascular;  Laterality: Right;  . ORCHIECTOMY    . PERIPHERAL VASCULAR INTERVENTION Left 08/20/2019   Procedure: PERIPHERAL VASCULAR INTERVENTION;  Surgeon: Marty Heck, MD;  Location: [facility];  Service: Cardiovascular;  Laterality: Left;  common/external iliac  . 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   Procedure: TRANSMETATARSAL AMPUTATION;  Surgeon: Waynetta Sandy, MD;  Location: [facility];  Service: Vascular;  Laterality:  Right;  Marland Kitchen VASECTOMY      Not on File  Current Outpatient Medications  Medication Sig Dispense Refill  . acetaminophen (TYLENOL) 500 MG tablet Take 500 mg by mouth every 8 (eight) hours as needed for moderate pain.     Marland Kitchen amoxicillin-clavulanate (AUGMENTIN) 875-125 MG tablet Take 1 tablet by mouth 2 (two) times daily. TAKE WITH A SNACK 60 tablet 12  . aspirin EC 81 MG EC tablet Take 1 tablet (81 mg total) by mouth daily. (Patient not taking: Reported on 03/22/2020) 30 tablet 0  . clopidogrel (PLAVIX) 75 MG tablet Take 1 tablet (75 mg total) by mouth daily with breakfast. 30 tablet 3  . collagenase (SANTYL) ointment Apply 1 application topically daily. 15 g 0  . mupirocin ointment (BACTROBAN) 2 % Apply to foot ulcer BID 22 g 0  . pantoprazole (PROTONIX) 40 MG tablet Take 1 tablet (40 mg total) by mouth daily. 30 tablet 4  . potassium chloride (KLOR-CON) 10 MEQ tablet Take 1 tablet (10 mEq total) by mouth daily. 10 tablet 0  . rosuvastatin (CRESTOR) 20 MG tablet Take 1 tablet (20 mg total) by mouth daily. 30 tablet 3  . senna-docusate (SENOKOT-S) 8.6-50 MG tablet Take 1 tablet by mouth 2 (two) times daily. (Patient not taking: Reported on 03/22/2020) 30 tablet 0  . sucralfate (CARAFATE) 1 g tablet Take 1 tablet (1 g total) by mouth 4 (four) times daily -  with meals and at bedtime. 120 tablet 3  . vitamin B-12 1000 MCG tablet Take 1 tablet (1,000 mcg total) by mouth daily. (Patient not taking: Reported on 03/22/2020) 30 tablet 0   No current facility-administered medications for this visit.    Family History  Problem Relation Age of Onset  . Hypertension Father   . Coronary artery disease Father   . Coronary artery disease Mother   . Cancer Mother        Breast    Social History   Socioeconomic History  . Marital status: Legally Separated    Spouse name: Not on file  . Number of children: Not on file  . Years of education: 75  . Highest education level: 12th grade  Occupational History   . Occupation: Art gallery manager: [employer]  Tobacco Use  . Smoking status: Current Every Day Smoker    Packs/day: 1.50    Years: 30.00    Pack years: 45.00    Types: Cigarettes  . Smokeless tobacco: Never Used  . Tobacco comment: stopped on admission to hospital on 08/19/19  Vaping Use  . Vaping Use: Never used  Substance and Sexual Activity  . Alcohol use: No  . Drug use: No  . Sexual activity: Not Currently  Other Topics Concern  . Not on file  Social History Narrative  . Not on file   Social Determinants of Health   Financial Resource Strain: Low Risk   .  Difficulty of Paying Living Expenses: Not hard at all  Food Insecurity: No Food Insecurity  . Worried About Charity fundraiser in the Last Year: Never true  . Ran Out of Food in the Last Year: Never true  Transportation Needs: No Transportation Needs  . Lack of Transportation (Medical): No  . Lack of Transportation (Non-Medical): No  Physical Activity: Inactive  . Days of Exercise per Week: 0 days  . Minutes of Exercise per Session: 0 min  Stress: No Stress Concern Present  . Feeling of Stress : Only a little  Social Connections: Socially Isolated  . Frequency of Communication with Friends and Family: More than three times a week  . Frequency of Social Gatherings with Friends and Family: Twice a week  . Attends Religious Services: Never  . Active Member of Clubs or Organizations: No  . Attends Archivist Meetings: Never  . Marital Status: Separated  Intimate Partner Violence: Not At Risk  . Fear of Current or Ex-Partner: No  . Emotionally Abused: No  . Physically Abused: No  . Sexually Abused: No     REVIEW OF SYSTEMS:   _0  denotes positive finding, _1  denotes negative finding Cardiac  Comments:  Chest pain or chest pressure:    Shortness of breath upon exertion:    Short of breath when lying flat:    Irregular heart rhythm:        Vascular    Pain in calf, thigh, or  hip brought on by ambulation:    Pain in feet at night that wakes you up from your sleep:     Blood clot in your veins:    Leg swelling:         Pulmonary    Oxygen at home:    Productive cough:     Wheezing:         Neurologic    Sudden weakness in arms or legs:     Sudden numbness in arms or legs:     Sudden onset of difficulty speaking or slurred speech:    Temporary loss of vision in one eye:     Problems with dizziness:         Gastrointestinal    Blood in stool:     Vomited blood:         Genitourinary    Burning when urinating:     Blood in urine:        Psychiatric    Major depression:         Hematologic    Bleeding problems:    Problems with blood clotting too easily:        Skin    Rashes or ulcers: x       Constitutional    Fever or chills:      PHYSICAL EXAMINATION:  Today's Vitals   06/29/20 1501  BP: (!) 142/89  Pulse: 81  Resp: 20  Temp: 98.1 F (36.7 C)  TempSrc: Temporal  SpO2: 95%  Weight: 140 lb 14.4 oz (63.9 kg)  Height: 6' (1.829 m)  PainSc: 4   PainLoc: Foot   Body mass index is 19.11 kg/m.   General:  WDWN in NAD; vital signs documented above Gait: Not observed HENT: WNL, normocephalic Pulmonary: normal non-labored breathing , without wheezing Cardiac: regular HR, without  Murmur; without carotid bruits Abdomen: soft, NT, no masses Skin: without rashes Vascular Exam/Pulses:  Right Left  Radial 2+ (normal) 2+ (normal)  Femoral 2+ (normal) 2+ (  normal)  DP 2+ (normal) monophasic  PT Unable to palpate  monphasic   Extremities: well healed trans met incision.       Musculoskeletal: no muscle wasting or atrophy  Neurologic: A&O X 3;  No focal weakness or paresthesias are detected Psychiatric:  The pt has Normal affect.   Non-Invasive Vascular Imaging:   ABI's/TBI's on 06/29/2020: Right:  1.27 - Great toe pressure: amputation Left:  0.82 - Great toe pressure: 0 very dampened wave form  RLE Arterial duplex on  06/29/2020: Right Graft #1: Femoral to Above knee popliteal  +------------------+--------+--------+---------+--------+           PSV cm/sStenosisWaveform Comments  +------------------+--------+--------+---------+--------+  Inflow      101       biphasic       +------------------+--------+--------+---------+--------+  Prox Anastomosis 78       triphasic      +------------------+--------+--------+---------+--------+  Mid Graft     62       biphasic       +------------------+--------+--------+---------+--------+  Distal Anastomosis73       triphasic      +------------------+--------+--------+---------+--------+  Outflow      116       biphasic       +------------------+--------+--------+---------+--------+   Summary:  Right: Widely patent bypass graft.   Previous ABI's/TBI's on 10/16/2019: Right:  1.11 - Great toe pressure: amputation Left:  0.61 - Great toe pressure:  absent   ASSESSMENT/PLAN:: 69 y.o. male here for follow up for right CFA to AK popliteal bypass with PTFE and amputation of right toes 1-3 by Dr. Donzetta Matters on 09/15/2019.    He subsequently developed a necrosis of the right 4th and 5th toes and fluid collection of the right thigh saphenectomy site.  He underwent exploration of the right AK popliteal anastomosis, I&D right medial thigh abscess and transmetatarsal amputation on 01/26/2020 by Dr. Donzetta Matters.  His RLE infection did not communicate to his graft.     -pt has a palpable right DP pulse and patent bypass graft.  He has a new wound on the right foot.  He was seen by Dr. Sherryle Lis on 06/17/2020 and he had concerns that the length of the remaining portions of the rays especially the 5th ray, the varus positioning of the forefoot as well as his posterior equinus and prominent TA tendon pull have contributed to significant pressure under the residual fifth metatarsal head. He felt  the pt wound benefit from revisional transmetatarsal amputation with remodeling of the remaining metatarsals, open tendo Achilles lengthening, and tibialis anterior tendon transfer to the intermediate or lateral cuneiform.  would expect to excise the ulcerated skin at the time of the surgery so he would no longer have an ulcer here.  He expects prolonged period of nonweightbearing for 4-6 weeks while the tendon lengthening and transfers heal with immobilization in a posterior splint and a fracture boot, followed by physical therapy and rehab. He has f/u appt with podiatry on 7/22.  I will talk with Dr. Donzetta Matters and let him know about pt's visit today.  -discussed with pt to continue taking baby aspirin and Crestor -pt has a nodule on the right lung and niece states they are goning to do a repeat CT scan to follow up.  -pt continues to smoke-he states a pack is lasting him 2-3 days.  Had a discussion about the importance of smoking cessation. -pt will f/u in 3 months with Dr. Donzetta Matters with ABI and RLE arterial duplex. Niece in agreement.  They will call sooner if he has any issues.    Leontine Locket, [facility] [phone]  Clinic MD:   Trula Slade

## 2020-06-30 ENCOUNTER — Other Ambulatory Visit: Payer: Self-pay | Admitting: *Deleted

## 2020-06-30 ENCOUNTER — Ambulatory Visit (INDEPENDENT_AMBULATORY_CARE_PROVIDER_SITE_OTHER): Payer: Medicare Other | Admitting: Family Medicine

## 2020-06-30 ENCOUNTER — Encounter: Payer: Self-pay | Admitting: Family Medicine

## 2020-06-30 VITALS — BP 138/86 | Temp 97.9°F | Ht 70.0 in | Wt 140.4 lb

## 2020-06-30 DIAGNOSIS — I700 Atherosclerosis of aorta: Secondary | ICD-10-CM | POA: Diagnosis not present

## 2020-06-30 DIAGNOSIS — I739 Peripheral vascular disease, unspecified: Secondary | ICD-10-CM

## 2020-06-30 DIAGNOSIS — J438 Other emphysema: Secondary | ICD-10-CM | POA: Diagnosis not present

## 2020-06-30 MED ORDER — TRAMADOL HCL 50 MG PO TABS: ORAL_TABLET | ORAL | 1 refills | Status: DC

## 2020-06-30 MED ORDER — SILDENAFIL CITRATE 100 MG PO TABS: 50.0000 mg | ORAL_TABLET | Freq: Every day | ORAL | 11 refills | Status: DC | PRN

## 2020-06-30 NOTE — Progress Notes (Signed)
   Subjective:    Patient ID: Raymond Walker., male    DOB: 04-18-1951, 69 y.o.   MRN: [id number]  HPI  Patient arrives for a follow up on depression. Patient states that he also saw the foot doctor yesterday and they found out why his foot hurts so bad and he is having surgery in September but they told his to discuss pain management with PCP and patient states he needs something to help with the foot pain  Review of Systems Patient denies any type of chest tightness pressure pain denies shortness of breath.    Objective:   Physical Exam Lungs are clear respiratory rate normal heart regular pulses are normal BP good patient has partial amputation of the right foot left leg normal patient with peripheral arterial disease  Fall Risk  05/24/2020 05/02/2020 03/17/2020 11/23/2019 07/31/2018  Falls in the past year? 0 0 0 0 No  Number falls in past yr: 0 - 0 - -  Injury with Fall? 0 - 0 - -  Risk for fall due to : No Fall Risks Impaired balance/gait;Orthopedic patient - - -  Follow up Education provided;Falls prevention discussed Falls evaluation completed Falls prevention discussed Falls evaluation completed -        Assessment & Plan:  1. PAD (peripheral artery disease) ([place]) Being followed by specialist.  At risk of active amputation they plan on doing additional surgery on his foot he relates a lot of pain and discomfort when he walks on his foot He is requesting medication use and evening time we will try tramadol in the evening patient to follow-up if any ongoing troubles.  Try to avoid strong narcotics  2. Aortic atherosclerosis ([place]) Keep cholesterol under good good control continue medication watch diet  3. Other emphysema ([place]) Encourage patient strongly to quit smoking patient will do a CT scan later in September with follow-up office visit in October  4. Diabetic foot ulcer with osteomyelitis [facility]) Being followed by specialist actually this is more of a prediabetes we will check  A1c periodically  Patient would benefit from a helper at his house to help her with food preparation patient is living on his own.  Patient has impaired skills when it comes to ADLs including food preparation.

## 2020-07-07 ENCOUNTER — Encounter: Payer: Self-pay | Admitting: Podiatry

## 2020-07-07 ENCOUNTER — Other Ambulatory Visit: Payer: Self-pay

## 2020-07-07 ENCOUNTER — Ambulatory Visit (INDEPENDENT_AMBULATORY_CARE_PROVIDER_SITE_OTHER): Payer: Medicare Other | Admitting: Podiatry

## 2020-07-07 VITALS — BP 77/53 | HR 88 | Temp 98.1°F | Resp 16

## 2020-07-07 DIAGNOSIS — I739 Peripheral vascular disease, unspecified: Secondary | ICD-10-CM | POA: Diagnosis not present

## 2020-07-07 DIAGNOSIS — L97512 Non-pressure chronic ulcer of other part of right foot with fat layer exposed: Secondary | ICD-10-CM | POA: Diagnosis not present

## 2020-07-07 DIAGNOSIS — M216X1 Other acquired deformities of right foot: Secondary | ICD-10-CM

## 2020-07-07 DIAGNOSIS — M21861 Other specified acquired deformities of right lower leg: Secondary | ICD-10-CM

## 2020-07-07 DIAGNOSIS — Z89431 Acquired absence of right foot: Secondary | ICD-10-CM | POA: Diagnosis not present

## 2020-07-07 DIAGNOSIS — F17200 Nicotine dependence, unspecified, uncomplicated: Secondary | ICD-10-CM

## 2020-07-07 NOTE — Progress Notes (Signed)
Subjective:  Patient ID: Raymond Radon., male    DOB: 10/03/1951,  MRN: [id number]  Chief Complaint  Patient presents with  . Wound Check    Follow-up; Right foot; plantar forefoot; pt stated, "I moved to an apartment last week; been on my feet a lot; got the Santyl and I do need more wound care supplies"    69 y.o. male presents with the above complaint. History confirmed with patient.  He returns again today with his niece.  He has been changing his dressing daily and applying Santyl ointment.  He recently went to a ground-level single floor apartment and this has been a positive change for him.  He still has mild pain in the ulcer site.  He recently saw the PA at Dr. Claretha Cooper office, and he is due to see Dr. Donzetta Matters again in October.  They told him his bypass graft is open with no blockages and they see no contraindication to proceeding with surgery.  He also saw his PCP Dr. Sallee Lange, who also states he would be able to proceed with surgery.  Office is also arranged for home health nurse and case worker through [employer], and his niece has the contact info today that, they are visiting about once a week, and can arrange more often visits if needed as well as PT and OT.  He is also due soon to see Dr. Tommy Medal, his infectious disease doctor who manages the antibiotics he has been on since the infection of his right thigh.  Otherwise he feels well and denies nausea, fever, chills, vomiting.  They are here today to discuss the surgical procedure that we discussed at last visit and decide on a date.   Objective:  Physical Exam: warm, good capillary refill, PT reduced right and palpable DP pulse right.  Right Foot: Healed transmetatarsal amputation.  Fifth ray is more prominent.  Plantar to the fifth ray distally there is a full-thickness ulceration with a fibrogranular base, improved since ankle administration, it measures 2 cm x 2 cm x 0.3 cm.  Mild serous drainage.  Mild hyperkeratosis around  the wound. Central fibrotic plug  No cellulitis or purulence.  No malodor.  Strength 5 out of 5 in all planes.  Prominent TA tendon noted.  Varus forefoot noted.  Equinus noted with no improvement with knee flexion.       Assessment:   1. Ulcer of right foot with fat layer exposed ([place])   2. 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 Acquired forefoot varus of right foot   4. Status post transmetatarsal amputation of foot, right ([place])   5. Peripheral arterial disease ([place])   6. Tobacco use disorder      Plan:  Patient was evaluated and treated and all questions answered.  - Today, we again had a long discussion about the proposed surgical intervention for the right foot amputation site and discussed the risk, benefits, and potential complications and expected postoperative course of the following procedures: Revision transmetatarsal amputation, with transfer of the tibialis anterior tendon, open tendo Achilles lengthening and immobilization in a short leg splint or cast following surgery for approximately 4 to 6 weeks.  I discussed with him and his niece that he will need to be nonweightbearing for the entire course of the postoperative period while the tendon transfers are healing.  Informed consent was discussed, reviewed, and signed by the patient, and he met with our surgery scheduler with a tentative date  of September 17 at [facility] - He states that he is able to use crutches and has used before and he has plenty of strength.  He has moved into a first floor single level apartment which will be easier for him to get around.  I discussed with him that I think would be a good idea that we order preoperative physical therapy and Occupational Therapy, his niece indicated that the [facility] caseworker be able to help [place] arrange this and have a physical therapist and occupational therapist sent to the house for crutch training and evaluate his ability to be  nonweightbearing prior to surgery.  I will discuss this with our office to help arrange.  -We discussed his case with the remainder of his care team including his PCP Dr. Wolfgang Phoenix, his vascular surgeon Dr. Donzetta Matters, and his ID doctor Dr. Tommy Medal -Continue current home wound care regimen for now with Santyl.  Debridement was performed today as below   Procedure: Excisional Debridement of Wound Rationale: Removal of non-viable soft tissue from the wound to promote healing.  Anesthesia: none Pre-Debridement Wound Measurements: 2.0 cm x 2.0 cm x 0.3 cm  Post-Debridement Wound Measurements: 2.0 cm x 2.0 cm x 0.5 cm  Type of Debridement: Sharp Excisional Tissue Removed: Non-viable soft tissue, removed fibrous central plug Depth of Debridement: subcutaneous tissue. Technique: Sharp excisional debridement to bleeding, viable wound base.  Dressing: Dry, sterile, compression dressing. Disposition: Patient tolerated procedure well. Patient to return in 1 week for follow-up.  Return in about 4 weeks (around 08/04/2020) for wound re-check.       Lanae Crumbly, DPM 07/07/2020      Return in about 4 weeks (around 08/04/2020) for wound re-check.

## 2020-07-08 ENCOUNTER — Other Ambulatory Visit: Payer: Self-pay | Admitting: Podiatry

## 2020-07-08 DIAGNOSIS — L97512 Non-pressure chronic ulcer of other part of right foot with fat layer exposed: Secondary | ICD-10-CM

## 2020-07-08 DIAGNOSIS — Z89431 Acquired absence of right foot: Secondary | ICD-10-CM

## 2020-07-08 DIAGNOSIS — M216X1 Other acquired deformities of right foot: Secondary | ICD-10-CM

## 2020-07-11 ENCOUNTER — Telehealth: Payer: Self-pay | Admitting: *Deleted

## 2020-07-11 DIAGNOSIS — M216X1 Other acquired deformities of right foot: Secondary | ICD-10-CM

## 2020-07-11 DIAGNOSIS — L97512 Non-pressure chronic ulcer of other part of right foot with fat layer exposed: Secondary | ICD-10-CM

## 2020-07-11 DIAGNOSIS — Z89431 Acquired absence of right foot: Secondary | ICD-10-CM

## 2020-07-11 NOTE — Telephone Encounter (Signed)
Left message informing Raymond Walker that [facility] does not have PT, I would be send orders to a HHC, that if there is no skilled nursing task, PT may not be covered by insurance.

## 2020-07-11 NOTE — Telephone Encounter (Signed)
I spoke with [place] and she states fax orders to [phone] and the office will make sure pt's orders are taken care of. Faxed referral form, SnapShot with clinical and demographics to [facility].

## 2020-07-11 NOTE — Telephone Encounter (Signed)
-----   Message from Criselda Peaches, DPM sent at 07/11/2020  8:56 AM EDT ----- Hi [name],  Would you be able to help with a referral for pre-op PT/OT at home for Raymond Walker? We have surgery planned for 09/02/20 and he'll need to be NWB for 6-8 weeks after surgery. Darrick Penna is helping me with a knee scooter, I was hoping they could do crutch/stair training with him as well as the scooter.  His PCP with [facility] has set up home care with [facility] according to his niece and he has a case Freight forwarder (said her name was Reeves Forth, and his home nurse is Prince [facility]). Their number is [phone]. If you need any orders from me let me know. His niece is his primary caretaker and can assist as well [facility], [phone]).  Thanks! Quita Skye

## 2020-07-11 NOTE — Telephone Encounter (Signed)
[facility] - [facility] states they are not a home health agency, and do not have PT.

## 2020-07-11 NOTE — Telephone Encounter (Signed)
Faxed required form, orders, SnapShot and demographics to [place].

## 2020-07-12 NOTE — Telephone Encounter (Signed)
Pt's niece, [name] states she is discussing with [facility] PT before and after surgery and will contact me with more information.

## 2020-07-12 NOTE — Telephone Encounter (Signed)
Left message requesting a call from pt's caregiver to discuss possibility of pt being seen in outpt PT, due to [facility] not accepting pt.

## 2020-07-21 ENCOUNTER — Other Ambulatory Visit: Payer: Self-pay | Admitting: *Deleted

## 2020-07-21 NOTE — Patient Outreach (Signed)
[facility]) Care Management  07/21/2020  Raymond Walker. 06/19/1951 [id number]   Call placed to member's niece to follow up on member being independent now.  She report he has been managing "alright."  She is working with him on properly managing his funds but report he is managing his medical conditions well.  He is scheduled for transmetatarsal amputation on 9/10 due to vascular disease.  She and providers were wondering if member could have PT before surgery to help with learning to be mobile without being weight bearing as well as using the stairs/crutches.  Advised niece of uncertainty of insurance approving PT for before and after surgery through home health.  She verbalizes understanding and inquires if [facility] would be able to work with member prior to surgery.  This care manager will follow up with [facility] and advise niece of capabilities.  She denies any urgent concerns at this time, agrees to follow up within the next month.  Goals Addressed              This Visit's Progress   .  Move into my own place (pt-stated)   On track     [place] (see longitudinal plan of care for additional care plan information)  Current Barriers:  . Literacy barriers . Cognitive Deficits  Nurse Case Manager Clinical Goal(s):  Marland Kitchen Over the next 14 days, patient will attend all scheduled medical appointments: PCP, Podiatrist, Vascular . Over the next 31 days, patient will demonstrate improved health management independence as evidenced bymoving into his own apartment . Over the next 14 days, patient will work with Chief of Staff (community agency) to increase in home support  Interventions:  . Inter-disciplinary care team collaboration (see longitudinal plan of care) . Collaborated with [facility]  regarding additional community needs . Discussed plans with patient for ongoing care management follow up and provided patient with direct contact information for  care management team . Provided patient and/or caregiver with aide information about [facility] (community resource). . Reviewed scheduled/upcoming provider appointments including: Vascular on 7/14, podiatry on 7/2, and PCP on 7/15  Patient Self Care Activities:  . Attends all scheduled provider appointments . Performs ADL's independently . Performs IADL's independently . Lacks social connections        [name], [place], MSN [place] [phone]

## 2020-07-25 ENCOUNTER — Other Ambulatory Visit: Payer: Self-pay | Admitting: *Deleted

## 2020-07-25 NOTE — Patient Outreach (Signed)
[facility]) Care Management  07/25/2020  Raymond Walker. July 25, 1951 [id number]   Call placed to Nira Conn, [place] with [facility] to discuss request to have member trained on DME and non-weightbearing prior to scheduled surgery.  Informed that they will be able to make a visit to member to provide some training, will discuss specifics of surgery and patient restrictions with surgeon prior to this visit.  Call placed to member's niece to provide update and advised to await call from [facility].  Will follow up with niece as planned post surgery, advised to contact this care manager with questions prior to procedure.  [name], [place], MSN [place] [phone]

## 2020-08-04 ENCOUNTER — Ambulatory Visit: Payer: Medicare Other | Admitting: Infectious Disease

## 2020-08-05 ENCOUNTER — Ambulatory Visit (INDEPENDENT_AMBULATORY_CARE_PROVIDER_SITE_OTHER): Payer: Medicare Other | Admitting: Podiatry

## 2020-08-05 ENCOUNTER — Other Ambulatory Visit: Payer: Self-pay

## 2020-08-05 DIAGNOSIS — L97512 Non-pressure chronic ulcer of other part of right foot with fat layer exposed: Secondary | ICD-10-CM | POA: Diagnosis not present

## 2020-08-05 DIAGNOSIS — M21861 Other specified acquired deformities of right lower leg: Secondary | ICD-10-CM

## 2020-08-05 DIAGNOSIS — F17200 Nicotine dependence, unspecified, uncomplicated: Secondary | ICD-10-CM

## 2020-08-05 DIAGNOSIS — I739 Peripheral vascular disease, unspecified: Secondary | ICD-10-CM | POA: Diagnosis not present

## 2020-08-05 DIAGNOSIS — Z89431 Acquired absence of right foot: Secondary | ICD-10-CM

## 2020-08-05 DIAGNOSIS — M216X1 Other acquired deformities of right foot: Secondary | ICD-10-CM

## 2020-08-05 NOTE — Patient Instructions (Signed)
Your surgery is scheduled for Friday August 26, 2020 at [facility]

## 2020-08-08 ENCOUNTER — Encounter: Payer: Self-pay | Admitting: *Deleted

## 2020-08-08 ENCOUNTER — Encounter: Payer: Self-pay | Admitting: Podiatry

## 2020-08-08 NOTE — Progress Notes (Signed)
  Subjective:  Patient ID: Raymond Walker., male    DOB: Mar 29, 1951,  MRN: [id number]  Chief Complaint  Patient presents with  . Wound Check    Pt states no concerns and has had some slight improvement. Denies fever/chills/nausea/vomiting.    69 y.o. male returns with the above complaint. History confirmed with patient.  He is here today by himself, his niece is out of town.  He thinks the wound is been about the same.  He understands that we have surgery planned tentatively for September 10.  He is contact about delivery of the rolling knee scooter but has not been delivered.  He says that he has moved to a new apartment in [place] and that may be it sent was sent to the wrong address.  Denies fevers chills nausea or vomiting.  Objective:  Physical Exam: warm, good capillary refill, PT reduced right and palpable DP pulse right.  Right Foot: Healed transmetatarsal amputation.  Fifth ray is more prominent.  Plantar to the fifth ray distally there is a full-thickness ulceration with a fibrogranular base, appears about the same since last visit, now measures 1.8 cm x 1.9 cm x 0.2 cm no cellulitis or purulence.  No malodor.  Strength 5 out of 5 in all planes.  Prominent TA tendon noted.  Varus forefoot noted.  Equinus noted with no improvement with knee flexion.       Assessment:   1. Ulcer of right foot with fat layer exposed ([place])   2. Acquired forefoot varus of right foot   3. Status post transmetatarsal amputation of foot, right ([place])   4. 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 Peripheral arterial disease ([place])   6. Tobacco use disorder      Plan:  Patient was evaluated and treated and all questions answered.  - Today, we again discussed the proposed surgical procedure.  He is in agreement and understands the procedure we have planned for September 10.  The remainder of his care team is also aware of this. -I will discuss with our office regarding his  therapy plan as well as the rolling knee scooter, and we will check to see if this was sent to the wrong address and send it to his new address in [place]. -Continue current home wound care regimen for now with Santyl.  Debridement was performed today as below   Procedure: Excisional Debridement of Wound Rationale: Removal of non-viable soft tissue from the wound to promote healing.  Anesthesia: none Pre-Debridement Wound Measurements:  1.8 cm x 1.9 cm x 0.2 cm Post-Debridement Wound Measurements: Same as predebridement  type of Debridement: Sharp selective Tissue Removed: Non-viable soft tissue, removed fibrous central plug Depth of Debridement: subcutaneous tissue. Technique: Sharp selective debridement to bleeding, viable wound base.  Dressing: Dry, sterile, compression dressing. Disposition: Patient tolerated procedure well.  Follow-up after surgery

## 2020-08-16 ENCOUNTER — Other Ambulatory Visit: Payer: Self-pay

## 2020-08-16 ENCOUNTER — Ambulatory Visit (INDEPENDENT_AMBULATORY_CARE_PROVIDER_SITE_OTHER): Payer: Medicare Other | Admitting: Family Medicine

## 2020-08-16 VITALS — BP 120/72 | HR 86 | Temp 97.3°F | Ht 72.0 in | Wt 141.4 lb

## 2020-08-16 DIAGNOSIS — J449 Chronic obstructive pulmonary disease, unspecified: Secondary | ICD-10-CM

## 2020-08-16 DIAGNOSIS — I700 Atherosclerosis of aorta: Secondary | ICD-10-CM | POA: Diagnosis not present

## 2020-08-16 DIAGNOSIS — Z720 Tobacco use: Secondary | ICD-10-CM | POA: Diagnosis not present

## 2020-08-16 DIAGNOSIS — I739 Peripheral vascular disease, unspecified: Secondary | ICD-10-CM

## 2020-08-16 MED ORDER — SUCRALFATE 1 G PO TABS: 1.0000 g | ORAL_TABLET | Freq: Three times a day (TID) | ORAL | 3 refills | Status: DC

## 2020-08-16 MED ORDER — ROSUVASTATIN CALCIUM 20 MG PO TABS: 20.0000 mg | ORAL_TABLET | Freq: Every day | ORAL | 3 refills | Status: DC

## 2020-08-16 MED ORDER — CLOPIDOGREL BISULFATE 75 MG PO TABS: 75.0000 mg | ORAL_TABLET | Freq: Every day | ORAL | 3 refills | Status: DC

## 2020-08-16 MED ORDER — PANTOPRAZOLE SODIUM 40 MG PO TBEC: 40.0000 mg | DELAYED_RELEASE_TABLET | Freq: Every day | ORAL | 4 refills | Status: DC

## 2020-08-16 NOTE — H&P (View-Only) (Signed)
   Subjective:    Patient ID: Raymond Walker., male    DOB: [birth year], 69 y.o.   MRN: [id number]  HPI  Patient arrives for surgical clearance for foot surgery. Very nice gentleman Has underlying history of COPD Up until recently smoked very heavily Now trying to keep less than 1/2 pack a day Uses albuterol inhaler as needed Patient has history of peripheral vascular disease along with history of leg infection from her previous surgery Patient also has history of aortic atherosclerosis takes cholesterol medicine regular basis At times has had elevated blood pressure and not currently taking any medication for this Although he is independent and can make decisions for himself there have been times where he did not properly take care of himself and he has the help of his niece currently He is currently living in his own apartment and states he is getting along fairly well.  Does have an underlying history of reflux related illness Patient was discharged in February due to sepsis, right thigh abscess, peripheral vascular disease with a history of a femoropopliteal bypass, and metabolic encephalopathy. He is followed by vascular surgery. Patient denies alcohol and drug use Review of Systems Denies any current chest tightness pressure pain shortness of breath denies nausea vomiting diarrhea rectal bleeding    Objective:   Physical Exam Has the appearance of emphysema but lungs sound clear no wheezing heart regular pulse normal BP good extremities trace edema in the lower legs        Assessment & Plan:  Medically he has clearance for his surgery It would be prudent for podiatry to also get the input of vascular surgery because of his severe peripheral artery disease COPD stable for surgery Patient has cut back on smoking Patient doing a better job of taking care of himself Continue cholesterol medication Watch diet closely Potentially hold off Plavix and aspirin before surgery but  will get vascular surgery's input regarding this.  Message has been sent to vascular surgery still awaiting their input. Patient may benefit from home health after surgery because he has some limited social skills in regards to taking care of himself. Does live independently his niece helps him out some.  Patient has been counseled to quit smoking but it is unlikely that he will do so.  He does pledged to cut back.

## 2020-08-16 NOTE — Progress Notes (Addendum)
   Subjective:    Patient ID: Raymond Walker., male    DOB: [birth year], 69 y.o.   MRN: [id number]  HPI  Patient arrives for surgical clearance for foot surgery. Very nice gentleman Has underlying history of COPD Up until recently smoked very heavily Now trying to keep less than 1/2 pack a day Uses albuterol inhaler as needed Patient has history of peripheral vascular disease along with history of leg infection from her previous surgery Patient also has history of aortic atherosclerosis takes cholesterol medicine regular basis At times has had elevated blood pressure and not currently taking any medication for this Although he is independent and can make decisions for himself there have been times where he did not properly take care of himself and he has the help of his niece currently He is currently living in his own apartment and states he is getting along fairly well.  Does have an underlying history of reflux related illness Patient was discharged in February due to sepsis, right thigh abscess, peripheral vascular disease with a history of a femoropopliteal bypass, and metabolic encephalopathy. He is followed by vascular surgery. Patient denies alcohol and drug use Review of Systems Denies any current chest tightness pressure pain shortness of breath denies nausea vomiting diarrhea rectal bleeding    Objective:   Physical Exam Has the appearance of emphysema but lungs sound clear no wheezing heart regular pulse normal BP good extremities trace edema in the lower legs        Assessment & Plan:  Medically he has clearance for his surgery It would be prudent for podiatry to also get the input of vascular surgery because of his severe peripheral artery disease COPD stable for surgery Patient has cut back on smoking Patient doing a better job of taking care of himself Continue cholesterol medication Watch diet closely Potentially hold off Plavix and aspirin before surgery but  will get vascular surgery's input regarding this.  Message has been sent to vascular surgery still awaiting their input. Patient may benefit from home health after surgery because he has some limited social skills in regards to taking care of himself. Does live independently his niece helps him out some.  Patient has been counseled to quit smoking but it is unlikely that he will do so.  He does pledged to cut back.

## 2020-08-22 ENCOUNTER — Encounter: Payer: Self-pay | Admitting: Family Medicine

## 2020-08-24 ENCOUNTER — Encounter (HOSPITAL_COMMUNITY)
Admission: RE | Admit: 2020-08-24 | Discharge: 2020-08-24 | Disposition: A | Discharge status: Home / Self Care | Payer: Medicare Other | Source: Ambulatory Visit | Attending: Podiatry | Admitting: Podiatry

## 2020-08-24 ENCOUNTER — Other Ambulatory Visit (HOSPITAL_COMMUNITY)
Admission: RE | Admit: 2020-08-24 | Discharge: 2020-08-24 | Disposition: A | Discharge status: Home / Self Care | Payer: Medicare Other | Source: Ambulatory Visit | Attending: Podiatry | Admitting: Podiatry

## 2020-08-24 ENCOUNTER — Encounter (HOSPITAL_COMMUNITY): Payer: Self-pay

## 2020-08-24 ENCOUNTER — Other Ambulatory Visit: Payer: Self-pay

## 2020-08-24 DIAGNOSIS — Z7982 Long term (current) use of aspirin: Secondary | ICD-10-CM | POA: Diagnosis not present

## 2020-08-24 DIAGNOSIS — Z01812 Encounter for preprocedural laboratory examination: Secondary | ICD-10-CM | POA: Insufficient documentation

## 2020-08-24 DIAGNOSIS — J449 Chronic obstructive pulmonary disease, unspecified: Secondary | ICD-10-CM | POA: Diagnosis not present

## 2020-08-24 DIAGNOSIS — Z89411 Acquired absence of right great toe: Secondary | ICD-10-CM | POA: Insufficient documentation

## 2020-08-24 DIAGNOSIS — F17210 Nicotine dependence, cigarettes, uncomplicated: Secondary | ICD-10-CM | POA: Diagnosis not present

## 2020-08-24 DIAGNOSIS — Z89421 Acquired absence of other right toe(s): Secondary | ICD-10-CM | POA: Diagnosis not present

## 2020-08-24 DIAGNOSIS — Z20822 Contact with and (suspected) exposure to covid-19: Secondary | ICD-10-CM | POA: Insufficient documentation

## 2020-08-24 DIAGNOSIS — Z7902 Long term (current) use of antithrombotics/antiplatelets: Secondary | ICD-10-CM | POA: Insufficient documentation

## 2020-08-24 DIAGNOSIS — Z8679 Personal history of other diseases of the circulatory system: Secondary | ICD-10-CM | POA: Diagnosis not present

## 2020-08-24 HISTORY — DX: Pneumonia, unspecified organism: J18.9

## 2020-08-24 HISTORY — DX: Congenital malformation, unspecified: Q89.9

## 2020-08-24 HISTORY — DX: Complete loss of teeth, unspecified cause, unspecified class: K08.109

## 2020-08-24 HISTORY — DX: Complete loss of teeth, unspecified cause, unspecified class: Z97.2

## 2020-08-24 LAB — BASIC METABOLIC PANEL
Anion gap: 11 (ref 5–15)
BUN: 13 mg/dL (ref 8–23)
CO2: 27 mmol/L (ref 22–32)
Calcium: 9.9 mg/dL (ref 8.9–10.3)
Chloride: 102 mmol/L (ref 98–111)
Creatinine, Ser: 0.82 mg/dL (ref 0.61–1.24)
GFR calc Af Amer: >60 mL/min (ref >60)
GFR calc non Af Amer: >60 mL/min (ref >60)
Glucose, Bld: 107 mg/dL — ABNORMAL HIGH (ref 70–99)
Potassium: 4.1 mmol/L (ref 3.5–5.1)
Sodium: 140 mmol/L (ref 135–145)

## 2020-08-24 LAB — CBC
HCT: 49.5 % (ref 39.0–52.0)
Hemoglobin: 15.2 g/dL (ref 13.0–17.0)
MCH: 26.6 pg (ref 26.0–34.0)
MCHC: 30.7 g/dL (ref 30.0–36.0)
MCV: 86.5 fL (ref 80.0–100.0)
Platelets: 157 10*3/uL (ref 150–400)
RBC: 5.72 MIL/uL (ref 4.22–5.81)
RDW: 15.3 % (ref 11.5–15.5)
WBC: 8 10*3/uL (ref 4.0–10.5)
nRBC: 0 % (ref 0.0–0.2)

## 2020-08-24 LAB — SARS CORONAVIRUS 2 (TAT 6-24 HRS): SARS Coronavirus 2: NEGATIVE

## 2020-08-24 LAB — SURGICAL PCR SCREEN
MRSA, PCR: NEGATIVE
Staphylococcus aureus: NEGATIVE

## 2020-08-24 NOTE — Pre-Procedure Instructions (Signed)
    Raymond Walker.  08/24/2020     Your procedure is scheduled on  Friday, August 26, 2020  Report to [facility] Admitting at 5:30 A.M.  Call this number if you have problems the morning of surgery:  [phone]   Remember: Brush your teeth the morning of surgery with your regular toothpaste.  Do not eat or drink after midnight the night before surgery.    Take these medicines the morning of surgery with A SIP OF WATER :   pantoprazole (PROTONIX) If needed: traMADol (ULTRAM) for moderate pain.  If needed: albuterol (VENTOLIN HFA) 108 (90 Base) MCG/ACT inhaler for wheezing or shortness of breath ( Bring inhaler in with you on day of surgery).  Stop taking all vitamins, fish oil and herbal medications. Do not take any NSAIDs ie: Ibuprofen, Advil, Naproxen (Aleve), Motrin, BC and Goody Powder; stop now.   Follow your surgeon's instructions regarding Aspirin and Clopidogrel (PLAVIX), if no instructions were provided, you will need to contact the surgeon for those instructions.  Do not smoke any cigarettes or tobacco products 24 hours prior to surgery.   Do not wear jewelry.  Do not wear lotions, powders, or perfumes, or deodorant.  Do not shave 48 hours prior to surgery.  Men may shave face and neck.  Do not bring valuables to the hospital.  [facility] is not responsible for any belongings or valuables.  Contacts, dentures or bridgework may not be worn into surgery.  Leave your suitcase in the car.  After surgery it may be brought to your room.  For patients admitted to the hospital, discharge time will be determined by your treatment team.  Patients discharged the day of surgery will not be allowed to drive home.  Special instructions: See " [facility] Preparing For Surgery" sheet.   Please read over the following fact sheets that you were given. Pain Booklet, Coughing and Deep Breathing and Surgical Site Infection Prevention

## 2020-08-24 NOTE — Progress Notes (Signed)
Pt denies any acute pulmonary issues. Pt denies chest pain and being under the care of a cardiologist. Pt stated that PCP is Dr. Sallee Lange. Pt reminded to quarantine. Pt verbalized understanding of all pre-op instructions. Pt chart forwarded to PA, Anesthesiology,  for review.

## 2020-08-24 NOTE — Anesthesia Preprocedure Evaluation (Addendum)
Anesthesia Evaluation  Patient identified by MRN, date of birth, ID band Patient awake    Reviewed: Patient's Chart, lab work & pertinent test results  Airway Mallampati: II  TM Distance: >3 FB Neck ROM: Full    Dental  (+) Edentulous Upper   Pulmonary COPD, Current Smoker and Patient abstained from smoking.,    Pulmonary exam normal        Cardiovascular hypertension, Pt. on medications + CAD and + Peripheral Vascular Disease   Rhythm:Regular Rate:Normal     Neuro/Psych Seizures -, Well Controlled,  PSYCHIATRIC DISORDERS Anxiety    GI/Hepatic Neg liver ROS, GERD  Medicated,  Endo/Other  diabetes, Well Controlled  Renal/GU negative Renal ROS     Musculoskeletal   Abdominal Normal abdominal exam  (+)   Peds negative pediatric ROS (+)  Hematology   Anesthesia Other Findings   Reproductive/Obstetrics                           Anesthesia Physical Anesthesia Plan  ASA: III  Anesthesia Plan: Regional and MAC   Post-op Pain Management:    Induction: Intravenous  PONV Risk Score and Plan:   Airway Management Planned: Oral ETT, Mask, Simple Face Mask and Nasal Cannula  Additional Equipment:   Intra-op Plan:   Post-operative Plan: Extubation in OR  Informed Consent:   Plan Discussed with:   Anesthesia Plan Comments: (PAT note by Karoline Caldwell, PA-C: History of COPD followed by PCP Dr. Sallee Lange.  Per recent notes until recently he smoked very heavily, now trying to smoke less than half pack per day.  He uses an albuterol inhaler as needed.  He was seen 08/16/2020 and medically cleared for surgery.  He has a history of PVD status post right femoral to above-knee popliteal bypass graft and right toe amputation 09/15/2019. He was admitted in February 2021 for sepsis secondary to right thigh abscess and right foot osteomyelitis.  During admission he underwent I&D of right medial thigh  abscess along with exploration of right above-knee popliteal graft anastomosis and transmetatarsal amputation of right foot on 01/26/2020 by vascular surgery.  Per notes, his RLE infection did not communicate to his graft.  Patient stated he did not have any preop instructions regarding his aspirin and Plavix.  He was instructed to call surgeon's office.  Preop labs reviewed, unremarkable.  EKG 05/21/2020: Sinus rhythm.  Rate 88.  Atrial premature complex  TTE 08/20/2019: 1. The left ventricle has normal systolic function with an ejection  fraction of 60-65%. The cavity size was normal. Left ventricular diastolic  Doppler parameters are consistent with pseudonormalization. Elevated mean  left atrial pressure.  2. The right ventricle has normal systolic function. The cavity was  normal.  3. Left atrial size was mildly dilated.  4. The mitral valve is grossly normal.  5. The tricuspid valve is grossly normal.  6. The aortic valve is tricuspid. Mild thickening of the aortic valve. No  stenosis of the aortic valve.  7. The aorta is normal unless otherwise noted.  8. Normal LV systolic function; grade 2 diastolic dysfunction; mild  proximal septal thickening; mild LAE.   Stress echo 12/19/2011: - Stress ECG conclusions: There were no stress arrhythmias  or conduction abnormalities. Exercise capacity was mildly  to moderately depressed. The stress ECG was negative for  ischemia at a somewhat submaximal heart rate.  - Staged echo: There was no echocardiographic evidence for  stress-induced ischemia.   )  Anesthesia Quick Evaluation  

## 2020-08-24 NOTE — Progress Notes (Signed)
Pt stated that he had labs drawn during his visit with Dr. Wolfgang Phoenix. Surgical Scheduler at Dr. Lance Sell office stated that the patient did not have labs.

## 2020-08-24 NOTE — Progress Notes (Signed)
Anesthesia Chart Review:  History of COPD followed by PCP Dr. Sallee Lange.  Per recent notes until recently he smoked very heavily, now trying to smoke less than half pack per day.  He uses an albuterol inhaler as needed.  He was seen 08/16/2020 and medically cleared for surgery.  He has a history of PVD status post right femoral to above-knee popliteal bypass graft and right toe amputation 09/15/2019. He was admitted in February 2021 for sepsis secondary to right thigh abscess and right foot osteomyelitis.  During admission he underwent I&D of right medial thigh abscess along with exploration of right above-knee popliteal graft anastomosis and transmetatarsal amputation of right foot on 01/26/2020 by vascular surgery.  Per notes, his RLE infection did not communicate to his graft.  Patient stated he did not have any preop instructions regarding his aspirin and Plavix.  He was instructed to call surgeon's office.  Preop labs reviewed, unremarkable.  EKG 05/21/2020: Sinus rhythm.  Rate 88.  Atrial premature complex  TTE 08/20/2019: 1. The left ventricle has normal systolic function with an ejection  fraction of 60-65%. The cavity size was normal. Left ventricular diastolic  Doppler parameters are consistent with pseudonormalization. Elevated mean  left atrial pressure.  2. The right ventricle has normal systolic function. The cavity was  normal.  3. Left atrial size was mildly dilated.  4. The mitral valve is grossly normal.  5. The tricuspid valve is grossly normal.  6. The aortic valve is tricuspid. Mild thickening of the aortic valve. No  stenosis of the aortic valve.  7. The aorta is normal unless otherwise noted.  8. Normal LV systolic function; grade 2 diastolic dysfunction; mild  proximal septal thickening; mild LAE.   Stress echo 12/19/2011: - Stress ECG conclusions: There were no stress arrhythmias  or conduction abnormalities. Exercise capacity was mildly  to moderately  depressed. The stress ECG was negative for  ischemia at a somewhat submaximal heart rate.  - Staged echo: There was no echocardiographic evidence for  stress-induced ischemia.   Wynonia Musty [facility] Short Stay Center/Anesthesiology Phone [phone] 08/24/2020 4:25 PM

## 2020-08-24 NOTE — Progress Notes (Signed)
Nurse spoke with Darrick Penna, Surgical Coordinator, regarding pre-op Plavix instructions. Darrick Penna stated that she will speak with the surgeon and follow up with the patient.

## 2020-08-25 ENCOUNTER — Other Ambulatory Visit: Payer: Self-pay | Admitting: *Deleted

## 2020-08-25 NOTE — Patient Outreach (Signed)
[facility]) Care Management  08/25/2020  Raymond Walker. Nov 21, 1951 [id number]   Call placed to member's niece to answer any questions regarding surgery tomorrow.  No answer, HIPAA compliant voice message left.  Will follow up with member/niece post discharge.  [name], [place], MSN [place] [phone]

## 2020-08-26 ENCOUNTER — Ambulatory Visit (HOSPITAL_COMMUNITY): Payer: Medicare Other

## 2020-08-26 ENCOUNTER — Other Ambulatory Visit: Payer: Self-pay

## 2020-08-26 ENCOUNTER — Ambulatory Visit (HOSPITAL_COMMUNITY): Payer: Medicare Other | Admitting: Certified Registered"

## 2020-08-26 ENCOUNTER — Encounter (HOSPITAL_COMMUNITY)
Admission: RE | Disposition: A | Discharge status: Home / Self Care | Payer: Self-pay | Source: Home / Self Care | Attending: Podiatry

## 2020-08-26 ENCOUNTER — Ambulatory Visit (HOSPITAL_COMMUNITY): Payer: Medicare Other | Admitting: Physician Assistant

## 2020-08-26 ENCOUNTER — Ambulatory Visit (HOSPITAL_COMMUNITY)
Admission: RE | Admit: 2020-08-26 | Discharge: 2020-08-26 | Disposition: A | Discharge status: Home / Self Care | Payer: Medicare Other | Attending: Podiatry | Admitting: Podiatry

## 2020-08-26 DIAGNOSIS — S98911A Complete traumatic amputation of right foot, level unspecified, initial encounter: Secondary | ICD-10-CM | POA: Diagnosis not present

## 2020-08-26 DIAGNOSIS — L97512 Non-pressure chronic ulcer of other part of right foot with fat layer exposed: Secondary | ICD-10-CM | POA: Diagnosis not present

## 2020-08-26 DIAGNOSIS — M21541 Acquired clubfoot, right foot: Secondary | ICD-10-CM | POA: Insufficient documentation

## 2020-08-26 DIAGNOSIS — F17210 Nicotine dependence, cigarettes, uncomplicated: Secondary | ICD-10-CM | POA: Diagnosis not present

## 2020-08-26 DIAGNOSIS — L97519 Non-pressure chronic ulcer of other part of right foot with unspecified severity: Secondary | ICD-10-CM | POA: Insufficient documentation

## 2020-08-26 DIAGNOSIS — E11621 Type 2 diabetes mellitus with foot ulcer: Secondary | ICD-10-CM | POA: Diagnosis not present

## 2020-08-26 DIAGNOSIS — I2510 Atherosclerotic heart disease of native coronary artery without angina pectoris: Secondary | ICD-10-CM | POA: Insufficient documentation

## 2020-08-26 DIAGNOSIS — M21961 Unspecified acquired deformity of right lower leg: Secondary | ICD-10-CM | POA: Diagnosis not present

## 2020-08-26 DIAGNOSIS — E1169 Type 2 diabetes mellitus with other specified complication: Secondary | ICD-10-CM | POA: Diagnosis not present

## 2020-08-26 DIAGNOSIS — M19079 Primary osteoarthritis, unspecified ankle and foot: Secondary | ICD-10-CM | POA: Diagnosis not present

## 2020-08-26 DIAGNOSIS — J449 Chronic obstructive pulmonary disease, unspecified: Secondary | ICD-10-CM | POA: Diagnosis not present

## 2020-08-26 DIAGNOSIS — M216X1 Other acquired deformities of right foot: Secondary | ICD-10-CM | POA: Insufficient documentation

## 2020-08-26 DIAGNOSIS — E1151 Type 2 diabetes mellitus with diabetic peripheral angiopathy without gangrene: Secondary | ICD-10-CM | POA: Diagnosis not present

## 2020-08-26 DIAGNOSIS — I700 Atherosclerosis of aorta: Secondary | ICD-10-CM | POA: Diagnosis not present

## 2020-08-26 DIAGNOSIS — Z9862 Peripheral vascular angioplasty status: Secondary | ICD-10-CM | POA: Insufficient documentation

## 2020-08-26 DIAGNOSIS — I10 Essential (primary) hypertension: Secondary | ICD-10-CM | POA: Insufficient documentation

## 2020-08-26 DIAGNOSIS — Z9889 Other specified postprocedural states: Secondary | ICD-10-CM

## 2020-08-26 DIAGNOSIS — M6701 Short Achilles tendon (acquired), right ankle: Secondary | ICD-10-CM | POA: Diagnosis not present

## 2020-08-26 DIAGNOSIS — Z89421 Acquired absence of other right toe(s): Secondary | ICD-10-CM | POA: Diagnosis not present

## 2020-08-26 DIAGNOSIS — M869 Osteomyelitis, unspecified: Secondary | ICD-10-CM | POA: Diagnosis not present

## 2020-08-26 HISTORY — PX: ACHILLES TENDON SURGERY: SHX542

## 2020-08-26 HISTORY — PX: TRANSMETATARSAL AMPUTATION: SHX6197

## 2020-08-26 HISTORY — PX: WOUND DEBRIDEMENT: SHX247

## 2020-08-26 SURGERY — AMPUTATION, FOOT, TRANSMETATARSAL
Anesthesia: Monitor Anesthesia Care | Site: Toe | Laterality: Right

## 2020-08-26 MED ORDER — CHLORHEXIDINE GLUCONATE 0.12 % MT SOLN
OROMUCOSAL | Status: AC
  Administered 2020-08-26: 15 mL via OROMUCOSAL
  Filled 2020-08-26: qty 15

## 2020-08-26 MED ORDER — LIDOCAINE 2% (20 MG/ML) 5 ML SYRINGE
INTRAMUSCULAR | Status: AC
  Filled 2020-08-26: qty 15

## 2020-08-26 MED ORDER — PHENYLEPHRINE 40 MCG/ML (10ML) SYRINGE FOR IV PUSH (FOR BLOOD PRESSURE SUPPORT)
PREFILLED_SYRINGE | INTRAVENOUS | Status: AC
  Filled 2020-08-26: qty 10

## 2020-08-26 MED ORDER — CHLORHEXIDINE GLUCONATE CLOTH 2 % EX PADS: 6.0000 | MEDICATED_PAD | Freq: Once | CUTANEOUS | Status: DC

## 2020-08-26 MED ORDER — MIDAZOLAM HCL 5 MG/5ML IJ SOLN
INTRAMUSCULAR | Status: DC | PRN
  Administered 2020-08-26 (×2): 1 mg via INTRAVENOUS

## 2020-08-26 MED ORDER — PROPOFOL 1000 MG/100ML IV EMUL
INTRAVENOUS | Status: AC
  Filled 2020-08-26: qty 100

## 2020-08-26 MED ORDER — HYDROMORPHONE HCL 1 MG/ML IJ SOLN: 0.2500 mg | INTRAMUSCULAR | Status: DC | PRN

## 2020-08-26 MED ORDER — BUPIVACAINE HCL (PF) 0.5 % IJ SOLN
INTRAMUSCULAR | Status: DC | PRN
  Administered 2020-08-26: 15 mL via PERINEURAL

## 2020-08-26 MED ORDER — MIDAZOLAM HCL 2 MG/2ML IJ SOLN
INTRAMUSCULAR | Status: AC
  Filled 2020-08-26: qty 2

## 2020-08-26 MED ORDER — GABAPENTIN 300 MG PO CAPS: 300.0000 mg | ORAL_CAPSULE | Freq: Three times a day (TID) | ORAL | 0 refills | Status: DC

## 2020-08-26 MED ORDER — BUPIVACAINE HCL (PF) 0.5 % IJ SOLN
INTRAMUSCULAR | Status: AC
  Filled 2020-08-26: qty 30

## 2020-08-26 MED ORDER — HYDROCODONE-ACETAMINOPHEN 7.5-325 MG PO TABS: 1.0000 | ORAL_TABLET | Freq: Once | ORAL | Status: DC | PRN

## 2020-08-26 MED ORDER — GLYCOPYRROLATE PF 0.2 MG/ML IJ SOSY
PREFILLED_SYRINGE | INTRAMUSCULAR | Status: DC | PRN
  Administered 2020-08-26: .1 mg via INTRAVENOUS

## 2020-08-26 MED ORDER — PROPOFOL 1000 MG/100ML IV EMUL
INTRAVENOUS | Status: AC
  Filled 2020-08-26: qty 300

## 2020-08-26 MED ORDER — FENTANYL CITRATE (PF) 250 MCG/5ML IJ SOLN
INTRAMUSCULAR | Status: AC
  Filled 2020-08-26: qty 5

## 2020-08-26 MED ORDER — ONDANSETRON HCL 4 MG/2ML IJ SOLN: 4.0000 mg | Freq: Once | INTRAMUSCULAR | Status: DC | PRN

## 2020-08-26 MED ORDER — CHLORHEXIDINE GLUCONATE 0.12 % MT SOLN: 15.0000 mL | Freq: Once | OROMUCOSAL | Status: AC

## 2020-08-26 MED ORDER — PROPOFOL 500 MG/50ML IV EMUL
INTRAVENOUS | Status: DC | PRN
  Administered 2020-08-26: 60 ug/kg/min via INTRAVENOUS

## 2020-08-26 MED ORDER — LIDOCAINE-EPINEPHRINE 1 %-1:100000 IJ SOLN
INTRAMUSCULAR | Status: AC
  Filled 2020-08-26: qty 1

## 2020-08-26 MED ORDER — LIDOCAINE-EPINEPHRINE 1 %-1:100000 IJ SOLN
INTRAMUSCULAR | Status: DC | PRN
  Administered 2020-08-26: 7 mL

## 2020-08-26 MED ORDER — FENTANYL CITRATE (PF) 100 MCG/2ML IJ SOLN
INTRAMUSCULAR | Status: DC | PRN
Start: 2020-08-26 — End: 2020-08-26
  Administered 2020-08-26 (×3): 25 ug via INTRAVENOUS

## 2020-08-26 MED ORDER — LACTATED RINGERS IV SOLN: INTRAVENOUS | Status: DC

## 2020-08-26 MED ORDER — MEPERIDINE HCL 25 MG/ML IJ SOLN: 6.2500 mg | INTRAMUSCULAR | Status: DC | PRN

## 2020-08-26 MED ORDER — ACETAMINOPHEN 500 MG PO TABS: 1000.0000 mg | ORAL_TABLET | Freq: Four times a day (QID) | ORAL | 0 refills | Status: AC | PRN

## 2020-08-26 MED ORDER — ORAL CARE MOUTH RINSE: 15.0000 mL | Freq: Once | OROMUCOSAL | Status: AC

## 2020-08-26 MED ORDER — CEFAZOLIN SODIUM-DEXTROSE 2-4 GM/100ML-% IV SOLN
2.0000 g | INTRAVENOUS | Status: AC
  Administered 2020-08-26: 2 g via INTRAVENOUS

## 2020-08-26 MED ORDER — PHENYLEPHRINE HCL-NACL 10-0.9 MG/250ML-% IV SOLN
INTRAVENOUS | Status: AC
  Filled 2020-08-26: qty 500

## 2020-08-26 MED ORDER — PROPOFOL 500 MG/50ML IV EMUL: INTRAVENOUS | Status: DC | PRN

## 2020-08-26 MED ORDER — PROPOFOL 500 MG/50ML IV EMUL
INTRAVENOUS | Status: AC
  Filled 2020-08-26: qty 50

## 2020-08-26 MED ORDER — ONDANSETRON HCL 4 MG/2ML IJ SOLN
INTRAMUSCULAR | Status: AC
  Filled 2020-08-26: qty 4

## 2020-08-26 MED ORDER — PROPOFOL 10 MG/ML IV BOLUS
INTRAVENOUS | Status: DC | PRN
  Administered 2020-08-26: 20 mg via INTRAVENOUS
  Administered 2020-08-26: 15 mg via INTRAVENOUS
  Administered 2020-08-26: 40 mg via INTRAVENOUS

## 2020-08-26 MED ORDER — PROPOFOL 10 MG/ML IV BOLUS
INTRAVENOUS | Status: AC
  Filled 2020-08-26: qty 40

## 2020-08-26 MED ORDER — OXYCODONE HCL 5 MG PO TABS: 5.0000 mg | ORAL_TABLET | ORAL | 0 refills | Status: AC | PRN

## 2020-08-26 MED ORDER — VANCOMYCIN HCL 1000 MG IV SOLR
INTRAVENOUS | Status: AC
  Filled 2020-08-26: qty 1000

## 2020-08-26 MED ORDER — 0.9 % SODIUM CHLORIDE (POUR BTL) OPTIME
TOPICAL | Status: DC | PRN
  Administered 2020-08-26: 1000 mL

## 2020-08-26 MED ORDER — CEFAZOLIN SODIUM-DEXTROSE 2-4 GM/100ML-% IV SOLN
INTRAVENOUS | Status: AC
  Filled 2020-08-26: qty 100

## 2020-08-26 MED ORDER — DEXAMETHASONE SODIUM PHOSPHATE 10 MG/ML IJ SOLN
INTRAMUSCULAR | Status: AC
  Filled 2020-08-26: qty 1

## 2020-08-26 SURGICAL SUPPLY — 49 items
BLADE LONG MED 31X9 (MISCELLANEOUS) ×3 IMPLANT
BNDG ELASTIC 4X5.8 VLCR STR LF (GAUZE/BANDAGES/DRESSINGS) ×3 IMPLANT
BNDG ESMARK 4X9 LF (GAUZE/BANDAGES/DRESSINGS) ×3 IMPLANT
BNDG GAUZE ELAST 4 BULKY (GAUZE/BANDAGES/DRESSINGS) ×3 IMPLANT
CHLORAPREP W/TINT 26 (MISCELLANEOUS) ×3 IMPLANT
COVER SURGICAL LIGHT HANDLE (MISCELLANEOUS) ×3 IMPLANT
COVER WAND RF STERILE (DRAPES) ×3 IMPLANT
DRAPE OEC MINIVIEW 54X84 (DRAPES) ×3 IMPLANT
DRAPE U-SHAPE 47X51 STRL (DRAPES) ×3 IMPLANT
DRSG EMULSION OIL 3X3 NADH (GAUZE/BANDAGES/DRESSINGS) ×3 IMPLANT
ELECT CAUTERY BLADE 6.4 (BLADE) ×3 IMPLANT
ELECT REM PT RETURN 9FT ADLT (ELECTROSURGICAL) ×3
ELECTRODE REM PT RTRN 9FT ADLT (ELECTROSURGICAL) ×2 IMPLANT
GAUZE SPONGE 4X4 12PLY STRL (GAUZE/BANDAGES/DRESSINGS) ×3 IMPLANT
GAUZE SPONGE 4X4 12PLY STRL LF (GAUZE/BANDAGES/DRESSINGS) ×3 IMPLANT
GAUZE XEROFORM 5X9 LF (GAUZE/BANDAGES/DRESSINGS) ×3 IMPLANT
GLOVE BIO SURGEON STRL SZ7.5 (GLOVE) ×3 IMPLANT
GLOVE BIOGEL PI IND STRL 8 (GLOVE) ×2 IMPLANT
GLOVE BIOGEL PI INDICATOR 8 (GLOVE) ×1
GOWN STRL REUS W/ TWL LRG LVL3 (GOWN DISPOSABLE) ×2 IMPLANT
GOWN STRL REUS W/ TWL XL LVL3 (GOWN DISPOSABLE) ×2 IMPLANT
GOWN STRL REUS W/TWL LRG LVL3 (GOWN DISPOSABLE) ×3
GOWN STRL REUS W/TWL XL LVL3 (GOWN DISPOSABLE) ×3
GUIDEROD G-FORCE W/SUTURE LOOP (ORTHOPEDIC DISPOSABLE SUPPLIES) ×3 IMPLANT
HANDPIECE INTERPULSE COAX TIP (DISPOSABLE) ×3
KIT BASIN OR (CUSTOM PROCEDURE TRAY) ×3 IMPLANT
KIT TURNOVER KIT B (KITS) ×3 IMPLANT
MANIFOLD NEPTUNE II (INSTRUMENTS) ×3 IMPLANT
NEEDLE HYPO 25GX1X1/2 BEV (NEEDLE) ×3 IMPLANT
NS IRRIG 1000ML POUR BTL (IV SOLUTION) ×3 IMPLANT
PACK ORTHO EXTREMITY (CUSTOM PROCEDURE TRAY) ×3 IMPLANT
PAD ARMBOARD 7.5X6 YLW CONV (MISCELLANEOUS) ×6 IMPLANT
PAD CAST 4YDX4 CTTN HI CHSV (CAST SUPPLIES) ×2 IMPLANT
PADDING CAST COTTON 4X4 STRL (CAST SUPPLIES) ×3
SCREW THRD G-FORCE 6X20 (Screw) ×3 IMPLANT
SCREW THRD G-FORCE 8X25 (Screw) ×3 IMPLANT
SET HNDPC FAN SPRY TIP SCT (DISPOSABLE) ×2 IMPLANT
SOL PREP POV-IOD 4OZ 10% (MISCELLANEOUS) ×6 IMPLANT
SUT ETHILON 2 0 FS 18 (SUTURE) ×15 IMPLANT
SUT ETHILON 3 0 PS 1 (SUTURE) ×3 IMPLANT
SUT FIBERWIRE 3-0 18 DIAM 3/8 (SUTURE) ×6
SUT PDS AB 2-0 CT2 27 (SUTURE) ×15 IMPLANT
SUTURE FIBERWR 3-0 18 DIAM 3/8 (SUTURE) ×4 IMPLANT
SYR BULB IRRIG 60ML STRL (SYRINGE) ×3 IMPLANT
SYR CONTROL 10ML LL (SYRINGE) ×3 IMPLANT
TOWEL GREEN STERILE (TOWEL DISPOSABLE) ×3 IMPLANT
TOWEL GREEN STERILE FF (TOWEL DISPOSABLE) ×3 IMPLANT
TUBE CONNECTING 12X1/4 (SUCTIONS) ×6 IMPLANT
YANKAUER SUCT BULB TIP NO VENT (SUCTIONS) ×6 IMPLANT

## 2020-08-26 NOTE — Anesthesia Procedure Notes (Signed)
Anesthesia Regional Block: Popliteal block   Pre-Anesthetic Checklist: ,, timeout performed, Correct Patient, Correct Site, Correct Laterality, Correct Procedure, Correct Position, site marked, Risks and benefits discussed,  Surgical consent,  Pre-op evaluation,  At surgeon's request and post-op pain management  Laterality: Right  Prep: Dura Prep       Needles:  Injection technique: Single-shot  Needle Type: Echogenic Stimulator Needle     Needle Length: 4cm  Needle Gauge: 20     Additional Needles:   Procedures:,,,, ultrasound used (permanent image in chart),,,,  Narrative:  Start time: 08/26/2020 7:20 AM End time: 08/26/2020 7:26 AM  Performed by: Personally  Anesthesiologist: Jemiah Cuadra, March Rummage, DO  Additional Notes: Patient identified. Risks/Benefits/Options discussed with patient including but not limited to bleeding, infection, nerve damage, failed block, incomplete pain control. Patient expressed understanding and wished to proceed. All questions were answered. Sterile technique was used throughout the entire procedure. Please see nursing notes for vital signs. Aspirated in 5cc intervals with injection for negative confirmation. Patient was given instructions on fall risk and not to get out of bed. All questions and concerns addressed with instructions to call with any issues or inadequate analgesia.

## 2020-08-26 NOTE — Anesthesia Postprocedure Evaluation (Signed)
Anesthesia Post Note  Patient: Raymond Walker.  Procedure(s) Performed: TRANSMETATARSAL AMPUTATION REVISION (Right Toe) ACHILLES TENDON LENGTHENING (Right ) EXCISION WOUND (Right )     Patient location during evaluation: PACU Anesthesia Type: Regional Level of consciousness: awake and alert Pain management: pain level controlled Vital Signs Assessment: post-procedure vital signs reviewed and stable Respiratory status: spontaneous breathing, nonlabored ventilation, respiratory function stable and patient connected to nasal cannula oxygen Cardiovascular status: blood pressure returned to baseline and stable Postop Assessment: no apparent nausea or vomiting Anesthetic complications: no   No complications documented.  Last Vitals:  Vitals:   08/26/20 1105 08/26/20 1135  BP: (!) 112/94 (P) 99/74  Pulse: 75 (P) 72  Resp: 20 (P) 18  Temp: 36.9 C (P) 36.8 C  SpO2: 93% (P) 95%    Last Pain:  Vitals:   08/26/20 1135  TempSrc:   PainSc: (P) 0-No pain                 Belenda Cruise P Shernita Rabinovich

## 2020-08-26 NOTE — Brief Op Note (Signed)
08/26/2020  11:02 AM  PATIENT:  Raymond Walker.  69 y.o. male  PRE-OPERATIVE DIAGNOSIS:  RIGHT FOOT ULCER  POST-OPERATIVE DIAGNOSIS:  RIGHT FOOT ULCER  PROCEDURE:  Procedure(s): TRANSMETATARSAL AMPUTATION REVISION (Right) ACHILLES TENDON LENGTHENING (Right) EXCISION WOUND (Right) With local tissue transfer, 4cm x 5cm Transfer Tibialis Anterior Tendon  SURGEON:  Surgeon(s) and Role:    * Kaydan Wilhoite, Stephan Minister, DPM - Primary    * Evelina Bucy, DPM - Assisting  PHYSICIAN ASSISTANT:   ASSISTANTS: none   ANESTHESIA:   regional and IV sedation  EBL:  44mL  BLOOD ADMINISTERED:none  DRAINS: none   LOCAL MEDICATIONS USED:  LIDOCAINE with epi 1:200,000 7cc  SPECIMEN:  Source of Specimen:  5th metatarsal for pathology  DISPOSITION OF SPECIMEN:  PATHOLOGY  COUNTS:  YES  TOURNIQUET:  * No tourniquets in log *  DICTATION: .Note written in EPIC  PLAN OF CARE: Discharge to home after PACU  PATIENT DISPOSITION:  PACU - hemodynamically stable.   Delay start of Pharmacological VTE agent (>24hrs) due to surgical blood loss or risk of bleeding: no

## 2020-08-26 NOTE — Interval H&P Note (Signed)
History and Physical Interval Note:  08/26/2020 7:08 AM  Raymond Walker.  has presented today for surgery, with the diagnosis of RIGHT FOOT ULCER.  The various methods of treatment have been discussed with the patient and family. After consideration of risks, benefits and other options for treatment, the patient has consented to  Procedure(s): TRANSMETATARSAL AMPUTATION REVISION (Right) ACHILLES TENDON LENGTHENING (Right) EXCISION WOUND (Right) as a surgical intervention.  The patient's history has been reviewed, patient examined, no change in status, stable for surgery.  I have reviewed the patient's chart and labs.  Questions were answered to the patient's satisfaction.     Criselda Peaches

## 2020-08-26 NOTE — Transfer of Care (Signed)
Immediate Anesthesia Transfer of Care Note  Patient: Raymond Walker.  Procedure(s) Performed: TRANSMETATARSAL AMPUTATION REVISION (Right Toe) ACHILLES TENDON LENGTHENING (Right ) EXCISION WOUND (Right )  Patient Location: PACU  Anesthesia Type:MAC combined with regional for post-op pain  Level of Consciousness: awake, alert , oriented and patient cooperative  Airway & Oxygen Therapy: Patient Spontanous Breathing and Patient connected to face mask oxygen  Post-op Assessment: Report given to RN and Post -op Vital signs reviewed and stable  Post vital signs: Reviewed and stable  Last Vitals:  Vitals Value Taken Time  BP 112/94 08/26/20 1107  Temp    Pulse 72 08/26/20 1109  Resp 16 08/26/20 1109  SpO2 100 % 08/26/20 1109  Vitals shown include unvalidated device data.  Last Pain:  Vitals:   08/26/20 1105  TempSrc:   PainSc: (P) 0-No pain         Complications: No complications documented.

## 2020-08-26 NOTE — Discharge Instructions (Signed)
Post-Surgery Instructions  1. If you are recuperating from surgery anywhere other than home, please be sure to leave [place] a number where you can be reached. 2. Go directly home and rest. 3. The keep operated foot (or feet) elevated six inches above the hip when sitting or lying down. 4. Support the elevated foot and leg with pillows under the calf. DO NOT PLACE PILLOWS UNDER THE KNEE. 5. DO NOT REMOVE or get your bandages wet. This will increase your chances of getting an infection. 6. Wear your surgical shoe at all times when you are up. 7. A limited amount of pain and swelling may occur. The skin may take on a bruised appearance. This is no cause for alarm. 8. For slight pain and swelling, apply an ice pack directly over the bandage for 15 minutes every hour. Continue icing until seen in the office. DO NOT apply any form of heat to the area. 9. Have prescription(s) filled immediately and take as directed. 10. Drink lots of liquids, water, and juice. 11. CALL THE OFFICE IMMEDIATELY IF: a. Bleeding continues b. Pain increases and/or does not respond to medication c. Bandage or cast appears too tight d. Any liquids (water, coffee, etc.) have spilled on your bandages. e. Tripping, falling, or stubbing the surgical foot f. If your temperature rises above 101 g. If you have ANY questions at all 12. Please use the crutches, knee scooter, or walker you have prescribed, rented, or purchased. If you are non-weight bearing DO NOT put weight on the operated foot for 28 days. If you are weight-bearing, follow your physician's instructions. You are expected to be: ? weight-bearing X non-weight bearing 13. Special Instructions: _____________________________________________________________ _________________________________________________________________________________ _________________________________________________________________________________  14. Your next appointment is: 09/01/2020 10:15  AM  If you need to reach the nurse for any reason, please call: [place]: [phone] [place]: [phone] [place]: [phone]

## 2020-08-26 NOTE — Anesthesia Procedure Notes (Addendum)
Anesthesia Regional Block: Adductor canal block   Pre-Anesthetic Checklist: ,, timeout performed, Correct Patient, Correct Site, Correct Laterality, Correct Procedure, Correct Position, site marked, Risks and benefits discussed,  Surgical consent,  Pre-op evaluation,  At surgeon's request and post-op pain management  Laterality: Right  Prep: Dura Prep       Needles:  Injection technique: Single-shot  Needle Type: Echogenic Stimulator Needle     Needle Length: 4cm  Needle Gauge: 20     Additional Needles:   Procedures:,,,, ultrasound used (permanent image in chart),,,,  Narrative:  Start time: 08/26/2020 7:10 AM End time: 08/26/2020 7:17 AM  Performed by: Personally  Anesthesiologist: Timara Loma, March Rummage, DO  Additional Notes: Patient identified. Risks/Benefits/Options discussed with patient including but not limited to bleeding, infection, nerve damage, failed block, incomplete pain control. Patient expressed understanding and wished to proceed. All questions were answered. Sterile technique was used throughout the entire procedure. Please see nursing notes for vital signs. Aspirated in 5cc intervals with injection for negative confirmation. Patient was given instructions on fall risk and not to get out of bed. All questions and concerns addressed with instructions to call with any issues or inadequate analgesia.

## 2020-08-27 NOTE — Op Note (Addendum)
Patient Name: Raymond Walker. DOB: 1951-11-26  MRN: [id number]   Date of Service: 08/26/2020  Surgeon: Dr. Lanae Crumbly, DPM Assistants: Dr. Hardie Pulley, D.P.M. Pre-operative Diagnosis:  1. Acquired cavovarus deformity of right foot 2. Metatarsal deformities right foot 3. Chronic nonhealing ulceration right foot 4. Equinus deformity right lower extremity Post-operative Diagnosis:  1. Acquired cavovarus deformity of right foot 2. Metatarsal deformities right foot 3. Chronic nonhealing ulceration right foot 4. Equinus deformity right lower extremity Procedures: 1. Tibialis anterior tendon transfer to lateral cuneiform right lower extremity 2. Partial excision metatarsal x4 3. Adjacent tissue transfer with medial plantar artery flap to close defect 4. Tendo Achilles lengthening  Pathology/Specimens: ID Type Source Tests Collected by Time Destination  1 : 5th Metatarsal Tissue PATH Digit amputation SURGICAL PATHOLOGY Criselda Peaches, DPM 08/26/2020 0821    Anesthesia: IV sedation with regional anesthesia popliteal and saphenous block by the anesthesia team Hemostasis: Esmarch bandage compression tourniquet around the ankle Estimated Blood Loss: 15 mL Materials:  Implant Name Type Inv. Item Serial No. Manufacturer Lot No. LRB No. Used Action  G-Force Tenodesis Screw   [id number] [place] [id number] Right 1 Explanted  Gforce Tenodesis Screw   [id number]  [id number] Right 1 Implanted   Medications: Lidocaine with epinephrine diluted 3:244,010  7cc Complications: None  Indications for Procedure:  This is a 69 y.o. male with a history of peripheral arterial disease status post femoropopliteal bypass and March 2021 of this year by Dr. Donzetta Matters with successful revascularization of the right lower extremity.  His postoperative course was complicated by infection of the thigh did not compromise the bypass as well as gangrene of the digits of the right lower extremity necessitating  transmetatarsal amputation which was performed by Dr. Donzetta Matters.  His incisions healed uneventfully and later developed a chronic nonhealing wound under the distal lateral plantar amputation stump.  I evaluated him my office and radiographs that revealed a prominent fifth metatarsal at the site of the nonhealing wound as well as acquired cavovarus contracture of the tibialis anterior tendon and Achilles tendon contributing to equinus contracture.  I discussed with the patient, his legal guardian his niece as well as his care team including Dr. Donzetta Matters that the wound would likely not heal due to the pressure from the prominent fifth metatarsal and deformity and that surgical reconstruction would help heal the wound and give him an ambulatory foot.  They elected for surgical intervention and informed consent was signed as well as a discussion including all the risk benefits and potential complications and the postoperative course was completed in the office.   Procedure in Detail: Patient was identified in pre-operative holding area. Formal consent was signed and the right lower extremity was marked.  He underwent regional anesthesia by the anesthesia team.  Patient was brought back to the operating room. Anesthesia was induced. The extremity was prepped and draped in the usual sterile fashion. Timeout was taken to confirm patient name, laterality, and procedure prior to incision.   Attention was then directed to the right lower extremity where the transmetatarsal amputation exhibited a prominent fifth metatarsal.  An Esmarch bandage was wrapped about the ankle as a compression tourniquet for this portion of the procedure.  An incision was created extending from the medial portion of his prior transmetatarsal amputation site incision and extending to encompass an area surrounding the chronic wound defect.  This was designed as an advancement tissue flap of the medial plantar artery angiosome.  The major  dimensions of the  area of defect excised was 4.0cm x 5.0 cm. This was carried deep through the subcutaneous tissue, cauterizing bleeders as necessary.  The wound defect in the surrounding skin required to advance the medial plantar artery flap was excised in a full-thickness manner, the wound did not extend to the level of the bone.  The prominent fifth metatarsal, fourth metatarsal, third metatarsal and second metatarsal were then sequentially exposed using sharp and blunt dissection.  The fifth metatarsal was initially resected using a sagittal saw, and sent to histopathology for analysis, and his parabola was checked with fluoroscopy.  We determined that revisional excision of the second, third, and fourth metatarsals would be necessary in order to recreate the metatarsal parabola in a standard transmetatarsal amputation fashion so that no prominence remained in to reduce his risk of reulceration.  These metatarsals were then resected using a sagittal saw as well.  Each resection site was then smoothed with rongeur and rasp.  The Esmarch bandage was then removed.  Excellent perfusion and blood flow to the amputation site and resection margins was noted.  The wound is then thoroughly irrigated with 3 L of sterile saline with pulse lavage.  The amputation site was then closed with 2-0 PDS, 2-0 nylon, 3-0 nylon.  We then performed the tendo Achilles lengthening.  Percutaneous incisions were made in [place] triple hemisection fashion and the posterior Achilles tendon at approximately 4 cm, 6 cm, and 8 cm above the insertion of the Achilles tendon on the calcaneus.  Gentle dorsiflexion was placed up on the ankle until the equinus deformity was adequately corrected.  The sites were closed with 3-0 nylon.  We then proceeded with the tibialis anterior tendon transfer.  We directed our attention to the dorsal medial midfoot, anterior lower leg, and dorsal lateral midfoot.  The incisions were placed over the medial cuneiform at the  insertion of the tibialis anterior tendon site, and the lower third of the leg just lateral to the anterior tibial crest over the tibialis anterior tendon before it became the myotendinous junction, and over the lateral cuneiform (position was checked on fluoroscopy).  Each incision was injected with a total of 7 cc of 1% lidocaine with epinephrine 1: 200,000.  Dissection at the harvest site was carried deep to subcutaneous tissue, the deep fascia was incised and the tibialis anterior tendon sheath was incised and the tendon exposed.  The distal insertion was then released from the bone and a 2 oh fiber loop was used to whipstitch the distal tendon.  Dissection through the lower leg incision was then carried out until the deep fascia and tibialis anterior tendon sheath were both incised and the tendon was pulled through to the lower leg we then directed our attention to the lateral cuneiform incision dissected to the level of the dorsal surface of the bone.  Position was again checked with fluoroscopy.  The extensor tendons and vital neurovascular structures were retracted safely during this part of the procedure.  The uterine packing forceps was then used to create a tunnel along the extensor digitorum longus tendon sheath and passed up deep to the extensor retinaculum into the lower anterior compartment of the leg.  The tibialis anterior tendon was then transferred through this tunnel to the level of the lateral cuneiform using the whipstitch suture.  The tendon was sized and we initially determined that a size 6 tenodesis screw from [facility] would be sufficient and a size 7 mm reamer was used  to create the tenodesis hole.  The wire passer was passed through this hole through the plantar foot and using the supplied suture passer the tendon was passed into the hole with adequate dorsiflexion and eversion of the foot to correct the acquired cavovarus deformity.  The size 6 screw was then placed into the  tenodesis site, however after repeated attempts the screw did not purchase with the tendon in the size 7 reamed hole.  We determined we needed to upsize the screw with a closer screw to ream ratio, and so a 7.5 mm reamer was used for a size 8 screw.  This was opened and then placed using the same technique and excellent screw purchase was achieved and the foot was able to maintain its correct position.  All sites were then thoroughly irrigated and closed with 3-0 Monocryl and 3-0 nylon.  The foot was then dressed with Xeroform, 4 x 4 sterile gauze, Kerlix and a well-padded below the knee total contact fiberglass cast. Patient tolerated the procedure well.   Disposition: Following a period of post-operative monitoring, patient will be transferred to home and will be nonweightbearing in the below-knee cast.  He will be seen in the office for further outpatient care.

## 2020-08-28 NOTE — Telephone Encounter (Signed)
Nurses Please send note to family member for outpatient type surgery typically we do not have any control over getting a person into rehab.  That would be literally up to family if they felt he needed to be somewhere.  Medicare will pay for rehab in certain situations that I am not 233% certain on.  The rehab facility would hopefully be of better help

## 2020-08-29 LAB — SURGICAL PATHOLOGY

## 2020-08-29 NOTE — Telephone Encounter (Signed)
Wendy-to the best of my knowledge all of this was completed back then Also to the best of my knowledge we no longer have those forms (I assume it was sent to the proper place but this is outside of my area once I complete these forms, if they need additional forms completed then send them and we will do additional forms)

## 2020-08-30 ENCOUNTER — Other Ambulatory Visit: Payer: Self-pay | Admitting: *Deleted

## 2020-08-30 NOTE — Patient Outreach (Signed)
[place] [facility]) Care Management  08/30/2020  Raymond Walker. [birth year] [id number]   Outreach attempt #2, unsuccessful.  Call placed to member's niece/caregiver to follow up on member's recent surgery.  No answer, HIPAA compliant voice message left.  Will send unsuccessful outreach letter and follow up within the next 3-4 business days.  [name], [place], MSN [place] [phone]

## 2020-08-31 ENCOUNTER — Encounter (HOSPITAL_COMMUNITY): Payer: Self-pay | Admitting: Podiatry

## 2020-09-01 ENCOUNTER — Other Ambulatory Visit: Payer: Self-pay

## 2020-09-01 ENCOUNTER — Ambulatory Visit (INDEPENDENT_AMBULATORY_CARE_PROVIDER_SITE_OTHER): Payer: Medicare Other | Admitting: Podiatry

## 2020-09-01 DIAGNOSIS — Z89431 Acquired absence of right foot: Secondary | ICD-10-CM | POA: Diagnosis not present

## 2020-09-01 DIAGNOSIS — M216X1 Other acquired deformities of right foot: Secondary | ICD-10-CM

## 2020-09-01 DIAGNOSIS — I739 Peripheral vascular disease, unspecified: Secondary | ICD-10-CM

## 2020-09-01 DIAGNOSIS — M21861 Other specified acquired deformities of right lower leg: Secondary | ICD-10-CM | POA: Diagnosis not present

## 2020-09-01 DIAGNOSIS — L97512 Non-pressure chronic ulcer of other part of right foot with fat layer exposed: Secondary | ICD-10-CM | POA: Diagnosis not present

## 2020-09-01 NOTE — Progress Notes (Signed)
  Subjective:  Patient ID: Raymond Radon., male    DOB: 1951-10-14,  MRN: [id number]  Chief Complaint  Patient presents with  . Routine Post Op    POV #1 DOS 08/26/2020 REVISION TRANSMETETARSAL AMPUTATION, ACHILLES TENDON LENGTHENING, EXCISION OF WOUND, TIBIALIS ANTERIOR TENDON TRANSFER    DOS: 08/26/2020 Procedure: Right foot revision transmetatarsal amputation second through fourth metatarsals, tibialis anterior tendon transfer the lateral cuneiform, tendo Achilles lengthening, adjacent tissue transfer with medial plantar artery flap to excise wound defect  69 y.o. male returns for post-op check.  Doing quite well.  He is here today again with his knees.  Has not put any weight on the foot.  Using the knee scooter.  Minimal pain  Review of Systems: Negative except as noted in the HPI. Denies N/V/F/Ch.   Objective:  There were no vitals filed for this visit. There is no height or weight on file to calculate BMI. Constitutional Well developed. Well nourished.  Vascular Foot warm and well perfused. Capillary refill normal to all digits.   Neurologic Normal speech. Oriented to person, place, and time. Epicritic sensation to light touch grossly present bilaterally.  Dermatologic Skin healing well without signs of infection. Skin edges well coapted without signs of infection.  Sutures intact.  The cast is clean dry and intact  Orthopedic: Tenderness to palpation noted about the surgical site.  Tendon transfer maintaining appropriate position   Fifth metatarsal bone biopsy was negative for osteomyelitis Assessment:   1. Ulcer of right foot with fat layer exposed ([place])   2. Acquired forefoot varus of right foot   3. Status post transmetatarsal amputation of foot, right ([place])   4. 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 Peripheral arterial disease ([place])    Plan:  Patient was evaluated and treated and all questions answered.  S/p foot surgery revision  transmetatarsal amputation second through fourth metatarsals, tibialis anterior tendon transfer the lateral cuneiform, tendo Achilles lengthening, adjacent tissue transfer with medial plantar artery flap to excise wound defect -Progressing as expected post-operatively. -WB Status: NWB in well-padded below-knee cast, a new one was applied today -Sutures: Remain intact for 2 more weeks. -Medications: No refills necessary for him today. -Foot redressed and a well-padded below-knee cast was applied.  Return in about 2 weeks (around 09/15/2020).

## 2020-09-01 NOTE — Progress Notes (Signed)
I just added to the inpatient list for Dr Megan Salon and Colletta Maryland to see. I am on this weekend as well

## 2020-09-02 ENCOUNTER — Other Ambulatory Visit: Payer: Self-pay | Admitting: Family Medicine

## 2020-09-02 ENCOUNTER — Other Ambulatory Visit: Payer: Self-pay | Admitting: *Deleted

## 2020-09-02 NOTE — Patient Outreach (Addendum)
[facility]) Care Management  09/02/2020  Raymond Walker. May 03, 1951 [id number]   Outreach attempt #3, unsuccessful.  Call placed to member's niece/caregiver to follow up on member's recent surgery.  No answer, HIPAA compliant voice message left.  Will make 4th and final attempt within the next 4 weeks.  If remain unsuccessful, will close at that time due to inability to maintain contact.    Update:  Incoming call received from niece Raymond Walker.  She report member is recovering well from foot surgery.  He was seen by surgeon on yesterday, incision examined and cast applied.  He will have follow up on 9/30 as well as 10/7, expect to have staples removed at that time.  He has been able to continue to stay in his own place with the help of another family member in addition to Raymond Walker.  She report a cousin has been staying with him during recovery and will continue to provide support.  She does inquire about having home aides come into the home.  State she was told that this was in progress but no further update has been provided.  Member does have Medicaid.  He also still has Remote health active in the home.  This care manager will inquire about aide services through Remote [facility] and PCP, will place referral for CSW if needed.  Raymond Walker denies any urgent concerns at this time, encouraged to contact this care manager with questions.  Will follow up within the next month.  Goals Addressed              This Visit's Progress   .  COMPLETED: Move into my own place (pt-stated)        CARE PLAN ENTRY (see longitudinal plan of care for additional care plan information)  Current Barriers:  . Literacy barriers . Cognitive Deficits  Nurse Case Manager Clinical Goal(s):  Marland Kitchen Over the next 14 days, patient will attend all scheduled medical appointments: PCP, Podiatrist, Vascular . Over the next 31 days, patient will demonstrate improved health management independence as evidenced  bymoving into his own apartment . Over the next 14 days, patient will work with Chief of Staff (community agency) to increase in home support  Interventions:  . Inter-disciplinary care team collaboration (see longitudinal plan of care) . Collaborated with [facility]  regarding additional community needs . Discussed plans with patient for ongoing care management follow up and provided patient with direct contact information for care management team . Provided patient and/or caregiver with aide information about [facility] (community resource). . Reviewed scheduled/upcoming provider appointments including: Vascular on 7/14, podiatry on 7/2, and PCP on 7/15  Patient Self Care Activities:  . Attends all scheduled provider appointments . Performs ADL's independently . Performs IADL's independently . Lacks social connections      .  THN - per niece: Recover from foot surgery (pt-stated)        CARE PLAN ENTRY (see longitudinal plan of care for additional care plan information)  Current Barriers:  Marland Kitchen Knowledge Deficits related to foot surgery  Nurse Case Manager Clinical Goal(s):  Marland Kitchen Over the next 28 days, patient will verbalize understanding of plan for signs/symptoms of infection . Over the next 28 days, patient will attend all scheduled medical appointments: foot surgeon . Over the next 31 days, patient will demonstrate improved health management independence as evidenced byimproved independence and management of surgical site  Interventions:  . Inter-disciplinary care team collaboration (see longitudinal plan of care) . Provided  education to patient re: signs/symptoms of infection . Discussed plans with patient for ongoing care management follow up and provided patient with direct contact information for care management team . Reviewed scheduled/upcoming provider appointments including:   Patient Self Care Activities:  . Attends all scheduled provider  appointments . Performs ADL's independently . Unable to perform IADLs independently  Initial goal documentation        Raymond David, RN, MSN [place] [phone]

## 2020-09-02 NOTE — Telephone Encounter (Signed)
Last seen 08/16/20

## 2020-09-06 ENCOUNTER — Encounter: Payer: Medicare Other | Admitting: Podiatry

## 2020-09-08 ENCOUNTER — Encounter: Payer: Medicare Other | Admitting: Podiatry

## 2020-09-15 ENCOUNTER — Other Ambulatory Visit: Payer: Self-pay

## 2020-09-15 ENCOUNTER — Ambulatory Visit (INDEPENDENT_AMBULATORY_CARE_PROVIDER_SITE_OTHER): Payer: Medicare Other | Admitting: Podiatry

## 2020-09-15 DIAGNOSIS — M216X1 Other acquired deformities of right foot: Secondary | ICD-10-CM

## 2020-09-15 DIAGNOSIS — M21861 Other specified acquired deformities of right lower leg: Secondary | ICD-10-CM

## 2020-09-15 DIAGNOSIS — Z9889 Other specified postprocedural states: Secondary | ICD-10-CM

## 2020-09-15 DIAGNOSIS — Z89431 Acquired absence of right foot: Secondary | ICD-10-CM

## 2020-09-15 NOTE — Progress Notes (Signed)
  Subjective:  Patient ID: Raymond Walker., male    DOB: January 12, 1951,  MRN: [id number]  Chief Complaint  Patient presents with  . Routine Post Op    POV #2 DOS 08/26/2020 REVISION TRANSMETETARSAL AMPUTATION, ACHILLES TENDON LENGTHENING, EXCISION OF WOUND, TIBIALIS ANTERIOR TENDON TRANSFER  per Dr Sherryle Lis    DOS: 08/26/2020 Procedure: Right foot revision transmetatarsal amputation second through fourth metatarsals, tibialis anterior tendon transfer the lateral cuneiform, tendo Achilles lengthening, adjacent tissue transfer with medial plantar artery flap to excise wound defect  69 y.o. male returns for post-op check.  He is still doing well.  He has not put any weight on the foot and is using a knee scooter.  He is here with his niece today.  The cast has become loose  Review of Systems: Negative except as noted in the HPI. Denies N/V/F/Ch.   Objective:  There were no vitals filed for this visit. There is no height or weight on file to calculate BMI. Constitutional Well developed. Well nourished.  Vascular Foot warm and well perfused. Capillary refill normal to all digits.   Neurologic Normal speech. Oriented to person, place, and time. Epicritic sensation to light touch grossly present bilaterally.  Dermatologic Skin healing well without signs of infection. Skin edges well coapted without signs of infection.  Sutures intact.  The cast is clean dry and intact  Orthopedic: Tenderness to palpation noted about the surgical site.  Tendon transfer maintaining appropriate position   Fifth metatarsal bone biopsy was negative for osteomyelitis Assessment:   No diagnosis found. Plan:  Patient was evaluated and treated and all questions answered.  S/p foot surgery revision transmetatarsal amputation second through fourth metatarsals, tibialis anterior tendon transfer the lateral cuneiform, tendo Achilles lengthening, adjacent tissue transfer with medial plantar artery flap to excise wound  defect -Progressing as expected post-operatively. -WB Status: NWB in well-padded below-knee cast, a new one was applied today -Sutures: All sutures removed today.  All incisions remain coapted.  No signs of infection.  Steri-Strips were applied. -A well-padded below-knee cast was applied. -We will remove cast at next visit and transition to partial weightbearing in an amputation boot  No follow-ups on file.

## 2020-09-22 ENCOUNTER — Encounter: Payer: Medicare Other | Admitting: Podiatry

## 2020-09-27 ENCOUNTER — Other Ambulatory Visit: Payer: Self-pay

## 2020-09-27 ENCOUNTER — Ambulatory Visit (INDEPENDENT_AMBULATORY_CARE_PROVIDER_SITE_OTHER): Payer: Medicare Other | Admitting: Podiatry

## 2020-09-27 DIAGNOSIS — Z89431 Acquired absence of right foot: Secondary | ICD-10-CM

## 2020-09-27 DIAGNOSIS — Z9889 Other specified postprocedural states: Secondary | ICD-10-CM | POA: Diagnosis not present

## 2020-09-27 DIAGNOSIS — M21861 Other specified acquired deformities of right lower leg: Secondary | ICD-10-CM

## 2020-09-27 DIAGNOSIS — I739 Peripheral vascular disease, unspecified: Secondary | ICD-10-CM

## 2020-09-27 DIAGNOSIS — M216X1 Other acquired deformities of right foot: Secondary | ICD-10-CM

## 2020-09-27 NOTE — Progress Notes (Signed)
  Subjective:  Patient ID: Raymond Walker., male    DOB: November 08, 1951,  MRN: [id number]  Chief Complaint  Patient presents with  . Routine Post Op      cast removal POV #3 DOS 08/26/2020 REVISION TRANSMETETARSAL AMPUTATION, ACHILLES TENDON LENGTHENING, EXCISION OF WOUND, TIBIALIS ANTERIOR TENDON TRANSFER    DOS: 08/26/2020 Procedure: Right foot revision transmetatarsal amputation second through fourth metatarsals, tibialis anterior tendon transfer the lateral cuneiform, tendo Achilles lengthening, adjacent tissue transfer with medial plantar artery flap to excise wound defect  69 y.o. male returns for post-op check.  He is still doing well.  He has not put any weight on the foot and is using a knee scooter.  He is here with his niece today.    Review of Systems: Negative except as noted in the HPI. Denies N/V/F/Ch.   Objective:  There were no vitals filed for this visit. There is no height or weight on file to calculate BMI. Constitutional Well developed. Well nourished.  Vascular Foot warm and well perfused. Capillary refill normal to all digits.   Neurologic Normal speech. Oriented to person, place, and time. Epicritic sensation to light touch grossly present bilaterally.  Dermatologic  incision have healed well, Steri-Strips intact  Orthopedic:  No pain to palpation.  Tendon transfer maintained.  Good active and resisted range of motion present   Fifth metatarsal bone biopsy was negative for osteomyelitis Assessment:   No diagnosis found. Plan:  Patient was evaluated and treated and all questions answered.  S/p foot surgery revision transmetatarsal amputation second through fourth metatarsals, tibialis anterior tendon transfer the lateral cuneiform, tendo Achilles lengthening, adjacent tissue transfer with medial plantar artery flap to excise wound defect  -Progressing as expected post-operatively. -WB Status: Begin partial weightbearing to heel and CAM boot, this was  dispensed today -Incisions are well-healed.  He may begin bathing and applying lotion to this -Begin physical therapy at home with unresisted active and passive range of motion nonweightbearing exercises -Schedule appointment for fitting for an orthopedic shoe with a amputation filler -At next visit we will transition to full weightbearing  Return in about 4 weeks (around 10/25/2020).

## 2020-09-27 NOTE — Patient Instructions (Addendum)
Physical Therapy: Evaluate and treat for 1-2 sessions / week for 4-6 weeks or at therapist's discretion. Patient had recent tendon transfer of tibialis anterior tendon, would like to include ROM, strengthening, stability. Begin for now with NWB passive and active ROM and strengthening. Will add WB exercises in 4 weeks from today (09/27/20)   Focus putting weight on the heel in the boot only for short distances around the house. Use a cane for balance. Use the scooter for longer distances

## 2020-09-30 ENCOUNTER — Other Ambulatory Visit: Payer: Self-pay | Admitting: *Deleted

## 2020-09-30 NOTE — Patient Outreach (Signed)
[facility]) Care Management  09/30/2020  Raymond Walker. 1951/06/09 [id number]   Call placed to niece to follow up on ongoing recovery from foot surgery, no answer.  HIPAA compliant voice message left.  Will follow up within the next 3-4 business days.  [name], [place], MSN [place] [phone]

## 2020-10-04 ENCOUNTER — Other Ambulatory Visit: Payer: Self-pay

## 2020-10-04 ENCOUNTER — Ambulatory Visit (INDEPENDENT_AMBULATORY_CARE_PROVIDER_SITE_OTHER): Payer: Medicare Other | Admitting: Family Medicine

## 2020-10-04 ENCOUNTER — Encounter: Payer: Self-pay | Admitting: Family Medicine

## 2020-10-04 VITALS — BP 110/68 | Temp 97.1°F | Ht 72.0 in | Wt 142.0 lb

## 2020-10-04 DIAGNOSIS — Z720 Tobacco use: Secondary | ICD-10-CM

## 2020-10-04 DIAGNOSIS — J438 Other emphysema: Secondary | ICD-10-CM

## 2020-10-04 MED ORDER — ALBUTEROL SULFATE HFA 108 (90 BASE) MCG/ACT IN AERS: 2.0000 | INHALATION_SPRAY | RESPIRATORY_TRACT | 6 refills | Status: DC | PRN

## 2020-10-04 MED ORDER — TRAMADOL HCL 50 MG PO TABS
50.0000 mg | ORAL_TABLET | Freq: Every day | ORAL | 1 refills | Status: DC | PRN
Start: 2020-10-04 — End: 2021-04-08

## 2020-10-04 NOTE — Progress Notes (Signed)
   Subjective:    Patient ID: Raymond Walker., male    DOB: 02-Aug-1951, 69 y.o.   MRN: [id number]  Hyperlipidemia This is a chronic problem. Pertinent negatives include no chest pain or shortness of breath. Treatments tried: rosuvastatin.  needs refill on albuterol and tramadol 50mg .  Patient enters had recent foot surgery Patient is trying to work hard on diet and staying physically active    Review of Systems  Constitutional: Negative for activity change.  HENT: Negative for congestion and rhinorrhea.   Respiratory: Negative for cough and shortness of breath.   Cardiovascular: Negative for chest pain.  Gastrointestinal: Negative for abdominal pain, diarrhea, nausea and vomiting.  Genitourinary: Negative for dysuria and hematuria.  Neurological: Negative for weakness and headaches.  Psychiatric/Behavioral: Negative for behavioral problems and confusion.       Objective:   Physical Exam Vitals reviewed.  Cardiovascular:     Rate and Rhythm: Normal rate and regular rhythm.     Heart sounds: Normal heart sounds. No murmur heard.   Pulmonary:     Effort: Pulmonary effort is normal.     Breath sounds: Normal breath sounds.  Lymphadenopathy:     Cervical: No cervical adenopathy.  Neurological:     Mental Status: He is alert.  Psychiatric:        Behavior: Behavior normal.    Patient defers on flu vaccine and Covid vaccine        Assessment & Plan:  Patient not diabetic Doing well with current diet Doing well with foot amputation Does have some orthopedic pains and discomfort Requests refills of tramadol Patient still smokes has been counseled to quit Patient defers colonoscopy until at least next spring Albuterol refills given Follow-up 3 to 4 months

## 2020-10-05 ENCOUNTER — Other Ambulatory Visit: Payer: Self-pay | Admitting: *Deleted

## 2020-10-05 NOTE — Patient Outreach (Signed)
[facility]) Care Management  10/05/2020  [place] February 07, 1951 [id number]   Outreach attempt #2, successful to member's niece [name].  She report member has continued to progress following transmetatarsal amputation.  He has had the cast removed, now has a boot.  He is scheduled to have fitting for special shoes on 10/26, will follow up with surgeon on 11/8.  She state he has continued to smoke despite being encouraged to stop in effort to help manage his PAD.  Remote health nursing and PT are still active with member, state their cousin is also active in his care.  She is wondering if the cousin will be able to get paid to care for member through [facility].  Advised that CSW is scheduled to call tomorrow, encouraged to discuss at that time.  Denies any urgent concerns at this time, encouraged to contact this care manager with questions.  Agrees to follow up within the next month.  Goals Addressed              This Visit's Progress   .  [facility] - Enhance My Mental Skills        Follow Up Date 12/20   - check out a senior citizen activity program - do word search or crossword puzzles daily - stay in touch with my family and friends - take a walk daily and think about what I am seeing    Why is this important?   As we age, or sometimes because we have an illness, it feels like our memory and ability to figure things out is not very good.  There are things you can do to keep your memory and your thinking as strong as possible.    Notes:     .  THN - Find Help in My Community        Follow Up Date 11/15   - follow-up on any referrals for help I am given - think ahead to make sure my need does not become an emergency - make a note about what I need to have by the phone or take with me, like an identification card or social security number have a back-up plan    Why is this important?   Knowing how and where to find help for yourself or family in your neighborhood and  community is an important skill.  You will want to take some steps to learn how.    Notes:  Referral to CSW placed    .  [facility] - Make and Keep All Appointments        Follow Up Date 11/20   - ask family or friend for a ride - call to cancel if needed - keep a calendar with appointment dates    Why is this important?   Part of staying healthy is seeing the doctor for follow-up care.  If you forget your appointments, there are some things you can do to stay on track.    Notes:     .  COMPLETED: [facility]N - per niece: Recover from foot surgery (pt-stated)        CARE PLAN ENTRY (see longitudinal plan of care for additional care plan information)  Current Barriers:  Marland Kitchen Knowledge Deficits related to foot surgery  Nurse Case Manager Clinical Goal(s):  Marland Kitchen Over the next 28 days, patient will verbalize understanding of plan for signs/symptoms of infection . Over the next 28 days, patient will attend all scheduled medical appointments: foot surgeon .  Over the next 31 days, patient will demonstrate improved health management independence as evidenced byimproved independence and management of surgical site  Interventions:  . Inter-disciplinary care team collaboration (see longitudinal plan of care) . Provided education to patient re: signs/symptoms of infection . Discussed plans with patient for ongoing care management follow up and provided patient with direct contact information for care management team . Reviewed scheduled/upcoming provider appointments including:   Patient Self Care Activities:  . Attends all scheduled provider appointments . Performs ADL's independently . Unable to perform IADLs independently  Resolving due to duplicate goal       [name], Therapist, sports, MSN [place] [phone]

## 2020-10-06 ENCOUNTER — Encounter: Payer: Self-pay | Admitting: *Deleted

## 2020-10-06 ENCOUNTER — Other Ambulatory Visit: Payer: Self-pay | Admitting: *Deleted

## 2020-10-06 NOTE — Patient Outreach (Signed)
[facility]) Care Management  10/06/2020  Raymond Walker. May 11, 1951 [id number]   CSW made an attempt to try and contact patient's niece and Healthcare Power of Attorney, [employer] today, to perform the initial phone assessment on patient, as well as assess and assist with social work needs and services, without success.  A HIPAA compliant message was left for Raymond Walker on voicemail and CSW is currently awaiting a return call.  CSW will make a second outreach attempt within the next 3-4 business days, if a return call is not received from Raymond Walker in the meantime.  CSW will also mail a Patient Unsuccessful Outreach Letter to patient's home, requesting that Raymond Walker contact CSW if she is interested in receiving social work services through [place] with [facility].  Raymond Walker, BSW, MSW, LCSW  Licensed Education officer, environmental Health System  Mailing [place] [address], [place], [place] [address] Physical Address-[address] [place], [place] [address] Toll Free Main # [phone] Fax # [phone] Cell # [phone]  [email or web address]

## 2020-10-07 ENCOUNTER — Other Ambulatory Visit (HOSPITAL_COMMUNITY): Payer: Medicare Other

## 2020-10-07 ENCOUNTER — Encounter (HOSPITAL_COMMUNITY): Payer: Medicare Other

## 2020-10-07 ENCOUNTER — Ambulatory Visit: Payer: Medicare Other | Admitting: Vascular Surgery

## 2020-10-11 ENCOUNTER — Other Ambulatory Visit: Payer: Self-pay

## 2020-10-11 ENCOUNTER — Ambulatory Visit: Payer: Medicare Other | Admitting: Orthotics

## 2020-10-11 DIAGNOSIS — M216X1 Other acquired deformities of right foot: Secondary | ICD-10-CM

## 2020-10-11 DIAGNOSIS — M21861 Other specified acquired deformities of right lower leg: Secondary | ICD-10-CM

## 2020-10-11 DIAGNOSIS — Z89431 Acquired absence of right foot: Secondary | ICD-10-CM

## 2020-10-11 NOTE — Progress Notes (Signed)
Cast today for Toe filler (non diabetic) R... Going to give him a pair of Diabetic Shoes gratis that we have in stock/non-returnable

## 2020-10-12 ENCOUNTER — Encounter: Payer: Self-pay | Admitting: *Deleted

## 2020-10-12 ENCOUNTER — Other Ambulatory Visit: Payer: Self-pay | Admitting: *Deleted

## 2020-10-12 NOTE — Patient Outreach (Signed)
[facility]) Care Management  10/12/2020  Marsha Hillman. 11-05-1951 [id number]    CSW was able to make initial contact with patient's niece, [name] today to perform the phone assessment on patient, as well as assess and assist with social work needs and services.  CSW introduced self, explained role and types of services provided through [place] [facility] ([facility]).  CSW further explained to [name] that CSW works with patient's RNCM, also with [facility], [name].  CSW then explained the reason for the call, indicating that Mrs. Orene Desanctis thought that patient would benefit from social work services and resources to assist with arranging in-home care, or PCS ([place]), in the home.  CSW obtained two HIPAA compliant identifiers from [name], which included patient's name and date of birth.  [name] indicated that her cousin, patient's other niece, has been providing in-home care services to patient since he underwent the last surgical procedure on his foot, which took place on August 26, 2020.  [name] went on to explain that her cousin is currently providing 24 hour care and supervision to patient, 4 days per week, for the entire 24 hours per day.  [name] reported that she tries to spend a great deal of time with patient for the remaining 3 days, ensuring that he has taken his prescription medications, eaten three healthy meals each day, has showered and changed his clothes and that his home is clean and free from clutter.  [name] stated that patient is able to perform all activities of daily living independently, but often requires prompting and some occasional assistance.    [name] admitted being concerned about patient's health and well-being when he is required to stay at home alone, realizing that patient is a fall risk, due to unsteady gait and balance disturbance.  Patient is currently  wearing an Christoper Allegra, which he will be required to wear for an additional 4 weeks.  [name] reported that she would really like to have a back-up plan in place for patient, with regards to in-home care services, in the event that her cousin becomes ill, or simply decides that she no longer wants to be patient's full-time caregiver.  CSW spoke with [name] at length about PCS (Personal Care Services), through [employer], explaining that patient is qualified to apply for services, as he is currently an active Adult Medicaid recipient, through the [place].   CSW agreed to mail ([address], [place], [place] [address]) [name] all of the following information:  PCS (Personal Care Services) Instructions; PCS (Personal Care Services) Application; PCS ([place]) Providers, as she indicated that she will be completing the application on patient's behalf, because patient only has a 3rd grade education, unable to read or write.  CSW will mail the packet to [name] today, then follow-up with her again next week, on Thursday, October 20, 2020, around 11:00 AM, to ensure that she received the packet, as well as assist with application completion, if necessary.  [name] voiced understanding and was agreeable to this plan.  CSW was able to confirm that [name] has the correct contact information for CSW, encouraging her to contact CSW directly if additional social work needs arise in the meantime.  Nat Christen, BSW, MSW, [place]  Licensed Education officer, environmental Health System  Mailing [phone]  [address] [place], [place] [address] Physical Address-[address], [place], [place] [address] Toll Free Main # [phone] Fax # [phone] Cell # [phone]  [email or web address]

## 2020-10-20 ENCOUNTER — Other Ambulatory Visit: Payer: Self-pay | Admitting: *Deleted

## 2020-10-20 NOTE — Patient Outreach (Signed)
[place] [facility]) Care Management  10/20/2020  Raymond Walker. 1951/11/21 [id number]   CSW made an attempt to try and contact patient's niece and legal guardian, [name] today, to follow-up regarding social work services and resources for patient, as well as to ensure that she received the packet of resource information mailed to her home by CSW last week; however, Mrs. [name] was unavailable at the time of CSW's call.  CSW left a HIPAA compliant message on voicemail for [name] and is currently awaiting a return call.  CSW will make a second outreach attempt within the next 3-4 business days, if a return call is not received from [name] in the meantime.  Nat Christen, BSW, MSW, LCSW  Licensed Education officer, environmental Health System  Mailing [place] [address], [place], [place] [address] Physical Address-[address], [place], [place] [address] Toll Free Main # [phone] Fax # [phone] Cell # [phone]  [email or web address]

## 2020-10-24 ENCOUNTER — Encounter: Payer: Self-pay | Admitting: Podiatry

## 2020-10-24 ENCOUNTER — Ambulatory Visit (INDEPENDENT_AMBULATORY_CARE_PROVIDER_SITE_OTHER): Payer: Medicare Other | Admitting: Podiatry

## 2020-10-24 ENCOUNTER — Other Ambulatory Visit: Payer: Self-pay

## 2020-10-24 DIAGNOSIS — M21861 Other specified acquired deformities of right lower leg: Secondary | ICD-10-CM

## 2020-10-24 DIAGNOSIS — Z89431 Acquired absence of right foot: Secondary | ICD-10-CM

## 2020-10-24 DIAGNOSIS — M216X1 Other acquired deformities of right foot: Secondary | ICD-10-CM

## 2020-10-24 NOTE — Progress Notes (Signed)
  Subjective:  Patient ID: Raymond Walker., male    DOB: 10-19-1951,  MRN: [id number]  Chief Complaint  Patient presents with  . 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 male returns for post-op check.  He is doing quite well.  He has been WBAT in the CAM boot.  He has come out of the boot once or twice in bed and she wants that he felt fine.  He is here by himself today.  Review of Systems: Negative except as noted in the HPI. Denies N/V/F/Ch.   Objective:  There were no vitals filed for this visit. There is no height or weight on file to calculate BMI. Constitutional Well developed. Well nourished.  Vascular Foot warm and well perfused. Capillary refill normal to all digits.   Neurologic Normal speech. Oriented to person, place, and time. Epicritic sensation to light touch grossly present bilaterally.  Dermatologic  all incisions well-healed without hypertrophic scar.  No hyperkeratosis or preulcerative lesions noted.  Orthopedic:  No pain to palpation.  Tendon transfer maintained.  Good active and resisted range of motion present.  Good strength.   Fifth metatarsal bone biopsy was negative for osteomyelitis         Assessment:   No diagnosis found. Plan:  Patient was evaluated and treated and all questions answered.  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  I would like him to continue his physical therapy, he can begin to discontinue use of the boot and return to regular shoe.  He was fitted for a shoe insert toe filler by our pedorthist, this has not arrived yet but hopefully will have in the next couple weeks.  I like to see him back in 3 months for follow-up, sooner if there are issues or concerning lesions.  Overall very pleased with his progress  Return in about 3 months (around 01/24/2021).

## 2020-10-26 ENCOUNTER — Other Ambulatory Visit: Payer: Self-pay | Admitting: *Deleted

## 2020-10-26 ENCOUNTER — Encounter: Payer: Self-pay | Admitting: *Deleted

## 2020-10-26 NOTE — Patient Outreach (Signed)
[place] [facility]) Care Management  10/26/2020  [place] [birth year] [id number]   CSW made a second attempt to try and contact patient and patient's niece, [name] today, to follow-up regarding social work services and resources for patient; however, neither were available at the time of CSW's call.  CSW left HIPAA compliant messages on voicemail for patient and [name], and continues to await a return call.  CSW will make a third outreach attempt within the next 3-4 business days, if a return call is not received from patient or [name] in the meantime.  CSW will also mail a Patient Unsuccessful Outreach Letter to patient's home, encouraging patient or [name] to contact CSW directly, if they are interested in continuing to receive social work services through [place] with [facility].  Nat Christen, BSW, MSW, LCSW  Licensed Education officer, environmental Health System  Mailing [place] [address], [place], [place] [address] Physical Address-[address], [place], [place] [address] Toll Free Main # [phone] Fax # [phone] Cell # [phone]  [email or web address]

## 2020-11-01 ENCOUNTER — Other Ambulatory Visit: Payer: Self-pay | Admitting: *Deleted

## 2020-11-01 ENCOUNTER — Encounter: Payer: Self-pay | Admitting: *Deleted

## 2020-11-01 NOTE — Patient Outreach (Signed)
[facility]) Care Management  11/01/2020  Blas Riches. Jul 20, 1951 [id number]   CSW was able to make contact with patient's niece, [name] today, to follow-up regarding social work services and resources for patient, as well as to confirm that [name] received the packet of resource information mailed to her home by CSW.  [name] confirmed receipt, but indicated that she has not filled out the application for [employer] worker), through [employer].  [name] went on to explain that she has spoken with the patient, as well as her cousin, patient's other niece, and that they have decided that [name]'s cousin will continue to provide care and supervision to patient in the home.  CSW will perform a case closure on patient, as all goals of treatment have been met from social work standpoint and no additional social work needs have been identified at this time.  CSW will notify patient's RNCM with [facility], [name] of CSW's plans to close patient's case.  CSW will fax an update to patient's Primary Care Physician, Dr. Sallee Lange to ensure that he is aware of CSW's involvement with patient's plan of care, in addition to routing a Physician Case Closure Letter.    Nat Christen, BSW, MSW, LCSW  Licensed Education officer, environmental Health System  Mailing [place] [address] [place], [place] [address] Physical Address-[address], [place], [place] [address] Toll Free Main # [phone] Fax # [phone] Cell # [phone]  [email or web address]

## 2020-11-07 ENCOUNTER — Other Ambulatory Visit: Payer: Self-pay | Admitting: *Deleted

## 2020-11-07 NOTE — Patient Outreach (Signed)
[facility]) Care Management  11/07/2020  Raymond Walker. Aug 01, 1951 [id number]   Outgoing call placed to niece/caregiver, state member is doing will.  Was seen by podiatrist on 11/8, wound healed.  He will have follow up vascular provider on 12/10.  Her cousin is still involved with management of his care as well.  She does express concern that member could potentially be evicted.  He has been caught smoking twice in his apartment, if caught a 3rd time, he will receive a 30 day eviction notice.  If this happens, she would likely proceed with resigning as his guardian.  She has done well with managing his care over the past several years but will not be able to continue if he is not compliant.  She will reach out to CSW to inquire about process.  Denies any medical concerns at this time, agrees to follow up within the next month.  Goals Addressed            This Visit's Progress   . [facility] - Enhance My Mental Skills   On track    Follow Up Date 12/20   - check out a senior citizen activity program - do word search or crossword puzzles daily - stay in touch with my family and friends - take a walk daily and think about what I am seeing    Why is this important?   As we age, or sometimes because we have an illness, it feels like our memory and ability to figure things out is not very good.  There are things you can do to keep your memory and your thinking as strong as possible.    Notes:     . COMPLETED: [facility] - Find Help in My Community       Follow Up Date 11/15   - follow-up on any referrals for help I am given - think ahead to make sure my need does not become an emergency - make a note about what I need to have by the phone or take with me, like an identification card or social security number have a back-up plan    Why is this important?   Knowing how and where to find help for yourself or family in your neighborhood and community is an important skill.  You  will want to take some steps to learn how.    Notes:  Referral to CSW placed    . [facility] - Make and Keep All Appointments   On track    Follow Up Date 12/20   - ask family or friend for a ride - call to cancel if needed - keep a calendar with appointment dates    Why is this important?   Part of staying healthy is seeing the doctor for follow-up care.  If you forget your appointments, there are some things you can do to stay on track.    Notes:   11/22 - Upcoming appointments reviewed with niece      [name], RN, MSN [place] Manager [phone]

## 2020-11-08 ENCOUNTER — Other Ambulatory Visit: Payer: Medicare Other | Admitting: Orthotics

## 2020-11-18 ENCOUNTER — Other Ambulatory Visit: Payer: Self-pay | Admitting: Family Medicine

## 2020-11-25 ENCOUNTER — Ambulatory Visit: Payer: Medicare Other | Admitting: Vascular Surgery

## 2020-11-25 ENCOUNTER — Encounter (HOSPITAL_COMMUNITY): Payer: Medicare Other

## 2020-11-25 ENCOUNTER — Other Ambulatory Visit (HOSPITAL_COMMUNITY): Payer: Medicare Other

## 2020-11-30 DIAGNOSIS — E119 Type 2 diabetes mellitus without complications: Secondary | ICD-10-CM | POA: Diagnosis not present

## 2020-11-30 DIAGNOSIS — H3581 Retinal edema: Secondary | ICD-10-CM | POA: Diagnosis not present

## 2020-11-30 DIAGNOSIS — Z0100 Encounter for examination of eyes and vision without abnormal findings: Secondary | ICD-10-CM | POA: Diagnosis not present

## 2020-12-01 ENCOUNTER — Other Ambulatory Visit: Payer: Self-pay | Admitting: *Deleted

## 2020-12-01 DIAGNOSIS — R911 Solitary pulmonary nodule: Secondary | ICD-10-CM

## 2020-12-01 DIAGNOSIS — R9389 Abnormal findings on diagnostic imaging of other specified body structures: Secondary | ICD-10-CM

## 2020-12-05 ENCOUNTER — Other Ambulatory Visit: Payer: Self-pay | Admitting: *Deleted

## 2020-12-05 NOTE — Patient Outreach (Signed)
[place] [facility]) Care Management  12/05/2020  Taje Littler. 1951/09/11 [id number]   Call placed to niece/guardian [name], no answer, HIPAA compliant voice message left.  Will follow up within the next 3-4 business days.  [name], [place], MSN [place] [phone]

## 2020-12-08 ENCOUNTER — Other Ambulatory Visit: Payer: Self-pay | Admitting: *Deleted

## 2020-12-08 NOTE — Patient Outreach (Signed)
[facility] [facility]) Care Management  12/08/2020  Raymond Walker. 16-Jan-1951 [id number]   Outreach attempt #2, unsuccessful, HIPAA compliant voice message left.  Will send outreach letter and follow up within the next 3-4 business days.  [name], [place], MSN [place] [phone]

## 2020-12-15 ENCOUNTER — Other Ambulatory Visit: Payer: Self-pay | Admitting: Family Medicine

## 2020-12-15 ENCOUNTER — Other Ambulatory Visit: Payer: Self-pay | Admitting: *Deleted

## 2020-12-15 NOTE — Patient Outreach (Signed)
[facility]) Care Management  12/15/2020  Raymond Walker. 1951-09-08 [id number]   Outreach attempt #3, unsuccessful, HIPAA compliant voice message left.  Will make 4th and final attempt within the next 4 weeks, if remain unsuccessful will close case at that time due to inability to maintain contact.  Kemper Durie, [place], MSN [facility] Care Management  [facility] Manager [phone]

## 2020-12-20 ENCOUNTER — Telehealth: Payer: Self-pay | Admitting: Family Medicine

## 2020-12-24 ENCOUNTER — Other Ambulatory Visit: Payer: Self-pay | Admitting: Family Medicine

## 2020-12-27 ENCOUNTER — Telehealth: Payer: Self-pay | Admitting: Family Medicine

## 2020-12-28 ENCOUNTER — Ambulatory Visit (HOSPITAL_COMMUNITY): Payer: Medicare Other

## 2021-01-04 ENCOUNTER — Encounter: Payer: Medicare Other | Admitting: Family Medicine

## 2021-01-04 ENCOUNTER — Encounter: Payer: Self-pay | Admitting: Family Medicine

## 2021-01-08 NOTE — Progress Notes (Signed)
This encounter was created in error - please disregard.

## 2021-01-13 ENCOUNTER — Other Ambulatory Visit: Payer: Self-pay | Admitting: *Deleted

## 2021-01-13 NOTE — Patient Outreach (Signed)
[facility]) Care Management  [place]  01/14/2021   Raymond Walker. [birth year] [id number]   Outreach attempt #4 to niece, successful.  State member has been doing about the same, still continues to smoke.  He may be facing eviction from his home by 2/28 if he does not comply with negotiations of the apartment managers.  Niece state she has been dealing with some health issues of her own and will not be able to continue being member's POA due to the stress of his noncompliance.  She will discuss with member's cousin the possibility of her taking over as POA, if unable to do so will look into getting court appointed guardian.  Otherwise, state member is doing well.  No recent complications of PAD, appointment with podiatrist on 2/8, will call to see when next visit with PCP.  Denies any urgent concerns, encouraged to contact this care manager with questions.   Encounter Medications:  Outpatient Encounter Medications as of 01/13/2021  Medication Sig  . albuterol (VENTOLIN HFA) 108 (90 Base) MCG/ACT inhaler Inhale 2 puffs into the lungs every 4 (four) hours as needed for wheezing or shortness of breath.  . clopidogrel (PLAVIX) 75 MG tablet TAKE 1 TABLET(75 MG) BY MOUTH DAILY WITH BREAKFAST  . pantoprazole (PROTONIX) 40 MG tablet Take 1 tablet (40 mg total) by mouth daily.  . rosuvastatin (CRESTOR) 20 MG tablet TAKE 1 TABLET(20 MG) BY MOUTH DAILY  . sucralfate (CARAFATE) 1 g tablet TAKE 1 TABLET(1 GRAM) BY MOUTH FOUR TIMES DAILY AT BEDTIME WITH MEALS  . traMADol (ULTRAM) 50 MG tablet Take 1 tablet (50 mg total) by mouth daily as needed for moderate pain.   No facility-administered encounter medications on file as of 01/13/2021.    Functional Status:  In your present state of health, do you have any difficulty performing the following activities: 10/12/2020 08/24/2020  Hearing? N N  Vision? N N  Difficulty concentrating or making decisions? Y N  Comment Altered Mental  Status -  Walking or climbing stairs? Y Y  Comment Cellulitis of Right Lower Extremity -  Dressing or bathing? Y N  Comment Diabetic Foot Ulcer -  Doing errands, shopping? Y N  Comment Cognitive Dysfunction -  Preparing Food and eating ? Y -  Comment Cellulitis of Right Lower Extremity -  Using the Toilet? N -  In the past six months, have you accidently leaked urine? Y -  Comment Occasional Urinary Incontinence -  Do you have problems with loss of bowel control? N -  Managing your Medications? Y -  Comment Altered Mental Status -  Managing your Finances? Y -  Comment Altered Mental Status -  Housekeeping or managing your Housekeeping? Y -  Comment Altered Mental Status -  Some recent data might be hidden    Fall/Depression Screening: Fall Risk  10/12/2020 10/04/2020 05/24/2020  Falls in the past year? 0 0 0  Number falls in past yr: 0 - 0  Injury with Fall? 0 - 0  Risk for fall due to : History of fall(s);Impaired balance/gait;Impaired mobility;Orthopedic patient - No Fall Risks  Follow up Education provided;Falls prevention discussed Falls evaluation completed Education provided;Falls prevention discussed   PHQ 2/9 Scores 10/12/2020 10/12/2020 05/24/2020 05/02/2020 03/17/2020 07/31/2018  PHQ - 2 Score 1 1 1  0 - 0  PHQ- 9 Score - - - - - 10  Exception Documentation Other- indicate reason in comment box - - - Other- indicate reason in comment box -  Not completed According to niece - Nurse, children's - - - - -    Assessment:  Goals Addressed            This Visit's Progress   . [facility] - Enhance My Mental Skills   On track    Follow Up Date 02/10/2021  Timeframe:  Long-Range Goal Priority:  High Start Date:        01/13/2021                     Expected End Date:      04/13/2021                   - check out a senior citizen activity program - do word search or crossword puzzles daily - stay in touch with my family and friends - take a walk daily and think about what I am seeing     Why is this important?   As we age, or sometimes because we have an illness, it feels like our memory and ability to figure things out is not very good.  There are things you can do to keep your memory and your thinking as strong as possible.    Notes:   1/28 - Discussed with niece increasing member's independence of managing medical care and living expenses    . [facility] - Make and Keep All Appointments   On track    Follow Up Date 2/25  Timeframe:  Short-Term Goal Priority:  Medium Start Date:       01/13/2021                      Expected End Date:      02/13/2021                   - ask family or friend for a ride - call to cancel if needed - keep a calendar with appointment dates    Why is this important?   Part of staying healthy is seeing the doctor for follow-up care.  If you forget your appointments, there are some things you can do to stay on track.    Notes:   11/22 - Upcoming appointments reviewed with niece  1/28 - Encouraged niece to have member schedule independent appointments and discuss obtaining new POA, possibly cousin       Plan:  Follow-up:  Patient agrees to Care Plan and Follow-up.  Will follow up within the next month.  [name], [place], MSN [place] [phone]

## 2021-01-16 ENCOUNTER — Other Ambulatory Visit: Payer: Self-pay | Admitting: Family Medicine

## 2021-01-24 ENCOUNTER — Ambulatory Visit: Payer: Medicare Other | Admitting: Orthotics

## 2021-01-24 ENCOUNTER — Ambulatory Visit (INDEPENDENT_AMBULATORY_CARE_PROVIDER_SITE_OTHER): Payer: Medicare Other | Admitting: Podiatry

## 2021-01-24 ENCOUNTER — Other Ambulatory Visit: Payer: Self-pay

## 2021-01-24 DIAGNOSIS — Z89431 Acquired absence of right foot: Secondary | ICD-10-CM

## 2021-01-24 DIAGNOSIS — M216X1 Other acquired deformities of right foot: Secondary | ICD-10-CM | POA: Diagnosis not present

## 2021-01-24 DIAGNOSIS — M21861 Other specified acquired deformities of right lower leg: Secondary | ICD-10-CM

## 2021-01-24 DIAGNOSIS — I739 Peripheral vascular disease, unspecified: Secondary | ICD-10-CM

## 2021-01-24 DIAGNOSIS — Z9889 Other specified postprocedural states: Secondary | ICD-10-CM

## 2021-01-24 NOTE — Progress Notes (Signed)
Need to order patient different shoe as the current one is too wide in RF, causing sllippage.

## 2021-01-25 ENCOUNTER — Encounter: Payer: Self-pay | Admitting: Podiatry

## 2021-01-25 NOTE — Progress Notes (Signed)
  Subjective:  Patient ID: Raymond Radon., male    DOB: Jan 02, 1951,  MRN: [id number]  Chief Complaint  Patient presents with  . 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 male returns for post-op check.  He is doing quite well.  He has no pain and has no reulceration.  There is an area on the right foot that is rubbing his tissue.  He was fitted for his diabetic shoes and the shoe filler but has not received them.  Review of Systems: Negative except as noted in the HPI. Denies N/V/F/Ch.   Objective:  There were no vitals filed for this visit. There is no height or weight on file to calculate BMI. Constitutional Well developed. Well nourished.  Vascular Foot warm and well perfused. Capillary refill normal to all digits.   Neurologic Normal speech. Oriented to person, place, and time. Epicritic sensation to light touch grossly present bilaterally.  Dermatologic  all incisions well-healed without hypertrophic scar.  Small abrasion area on the distal stump over the medial cuneiform, no ulceration or skin compromise  Orthopedic:  No pain to palpation.  Tendon transfer maintained.  Good active and resisted range of motion present.  Good strength.   Fifth metatarsal bone biopsy was negative for osteomyelitis     Assessment:   No diagnosis found. Plan:  Patient was evaluated and treated and all questions answered.  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  Complete his chance at limb salvage is quite high at this point.  He is ambulating well.  I do think it is imperative that we get him into an extra-depth diabetic shoe with a shoe filler.  I did have him meet with the pedorthist today who refitted him, we have the shoe filler but the shoes that have been ordered do not fit him.  We will try to get these ASAP so that he can continue to ambulate and prevent ulceration.  There is one small area of abrasion that I cautioned him to watch out for and he will keep an eye on this and apply a foam offloading pad that he has at home.  I would like to see him back in 6 months, sooner if he has any issues or ulceration  Return in about 6 months (around 07/24/2021).

## 2021-02-08 ENCOUNTER — Other Ambulatory Visit: Payer: Medicare Other | Admitting: Orthotics

## 2021-02-10 ENCOUNTER — Other Ambulatory Visit: Payer: Self-pay | Admitting: *Deleted

## 2021-02-10 NOTE — Patient Outreach (Signed)
[place] [facility]) Care Management  02/10/2021  Raymond Walker. 09-May-1951 [id number]   Call placed to niece, no answer, HIPAA compliant voice message left.  Will follow up within the next 3-4 business days.  [name], [place], MSN [place] [phone]

## 2021-02-16 ENCOUNTER — Other Ambulatory Visit: Payer: Self-pay | Admitting: *Deleted

## 2021-02-16 NOTE — Patient Outreach (Signed)
[facility] [facility]) Care Management  02/16/2021  Fines Kimberlin. 12/29/1950 [id number]   Outreach attempt #2 to niece, successful.  She report member has been doing well, was not evicted from apartment.  He has been becoming more independent in his care, attending all MD visits alone.  His cousin remains involved as well, [name] will remain POA for time being.  Discussed transition to chronic case management team, she verbalizes understanding.  Request calls now be to member directly and she be used as a back up.  Patient is being transitioned to Chronic Care Management with the primary provider office, case closed.   Goals Addressed            This Visit's Progress   . COMPLETED: [facility] - Enhance My Mental Skills       Follow Up Date 02/10/2021  Timeframe:  Long-Range Goal Priority:  High Start Date:        01/13/2021                     Expected End Date:      04/13/2021                   - check out a senior citizen activity program - do word search or crossword puzzles daily - stay in touch with my family and friends - take a walk daily and think about what I am seeing    Why is this important?   As we age, or sometimes because we have an illness, it feels like our memory and ability to figure things out is not very good.  There are things you can do to keep your memory and your thinking as strong as possible.    Notes:   1/28 - Discussed with niece increasing member's independence of managing medical care and living expenses  3/3 - Patient is being transitioned to Chronic Care Management with the primary provider office, case closed.    . COMPLETED: [facility] - Make and Keep All Appointments       Follow Up Date 2/25  Timeframe:  Short-Term Goal Priority:  Medium Start Date:       01/13/2021                      Expected End Date:      02/13/2021                   - ask family or friend for a ride - call to cancel if needed - keep a calendar with appointment  dates    Why is this important?   Part of staying healthy is seeing the doctor for follow-up care.  If you forget your appointments, there are some things you can do to stay on track.    Notes:   11/22 - Upcoming appointments reviewed with niece  1/28 - Encouraged niece to have member schedule independent appointments and discuss obtaining new POA, possibly cousin  3/3 - Patient is being transitioned to Chronic Care Management with the primary provider office, case closed.       [name], [place], MSN [place] [phone]

## 2021-03-21 ENCOUNTER — Ambulatory Visit (INDEPENDENT_AMBULATORY_CARE_PROVIDER_SITE_OTHER): Payer: Medicare Other | Admitting: Podiatry

## 2021-03-21 ENCOUNTER — Other Ambulatory Visit: Payer: Self-pay

## 2021-03-21 DIAGNOSIS — Z89431 Acquired absence of right foot: Secondary | ICD-10-CM

## 2021-03-21 DIAGNOSIS — I739 Peripheral vascular disease, unspecified: Secondary | ICD-10-CM

## 2021-03-21 NOTE — Progress Notes (Signed)
The patient presented to the office today to pick up diabetic shoes and 1 pair of diabetic custom inserts.  1 pair of inserts were put in the shoes and the shoes were fitted to the patient. The patient states they are comfortable and free of defect.  He as satisfied with the fit of the shoe. Instructions for break in and wear were dispensed. The patient signed the ABN form.  If any concerns or questions arise, he is instructed to call.

## 2021-04-07 ENCOUNTER — Other Ambulatory Visit: Payer: Self-pay | Admitting: Family Medicine

## 2021-05-07 ENCOUNTER — Other Ambulatory Visit: Payer: Self-pay | Admitting: Family Medicine

## 2021-05-12 ENCOUNTER — Telehealth: Payer: Self-pay | Admitting: *Deleted

## 2021-05-12 NOTE — Chronic Care Management (AMB) (Signed)
  Chronic Care Management   Outreach Note  05/12/2021 Name: Masayuki Sakai. MRN: [id number] DOB: 07/31/1951  Sherren Kerns Muhamed Luecke. is a 70 y.o. year old male who is a primary care patient of Luking, Elayne Snare, MD. I reached out to [name] by phone today in response to a referral sent by [name]'s PCP, Dr. Wolfgang Phoenix      An unsuccessful telephone outreach was attempted today. The patient was referred to the case management team for assistance with care management and care coordination.   Follow Up Plan: A HIPAA compliant phone message was left for the patient providing contact information and requesting a return call. The care management team will reach out to the patient again over the next 7 days. If patient returns call to provider office, please advise to call [place] at [phone].  [place] Management

## 2021-05-18 IMAGING — CR DG CHEST 1V PORT
1 series · 1 of 1 positions shown · non-contrast
Comparison: Chest x-ray dated 10/10/2015

CLINICAL DATA: Foot infection

EXAM:
PORTABLE CHEST 1 VIEW

[portable]
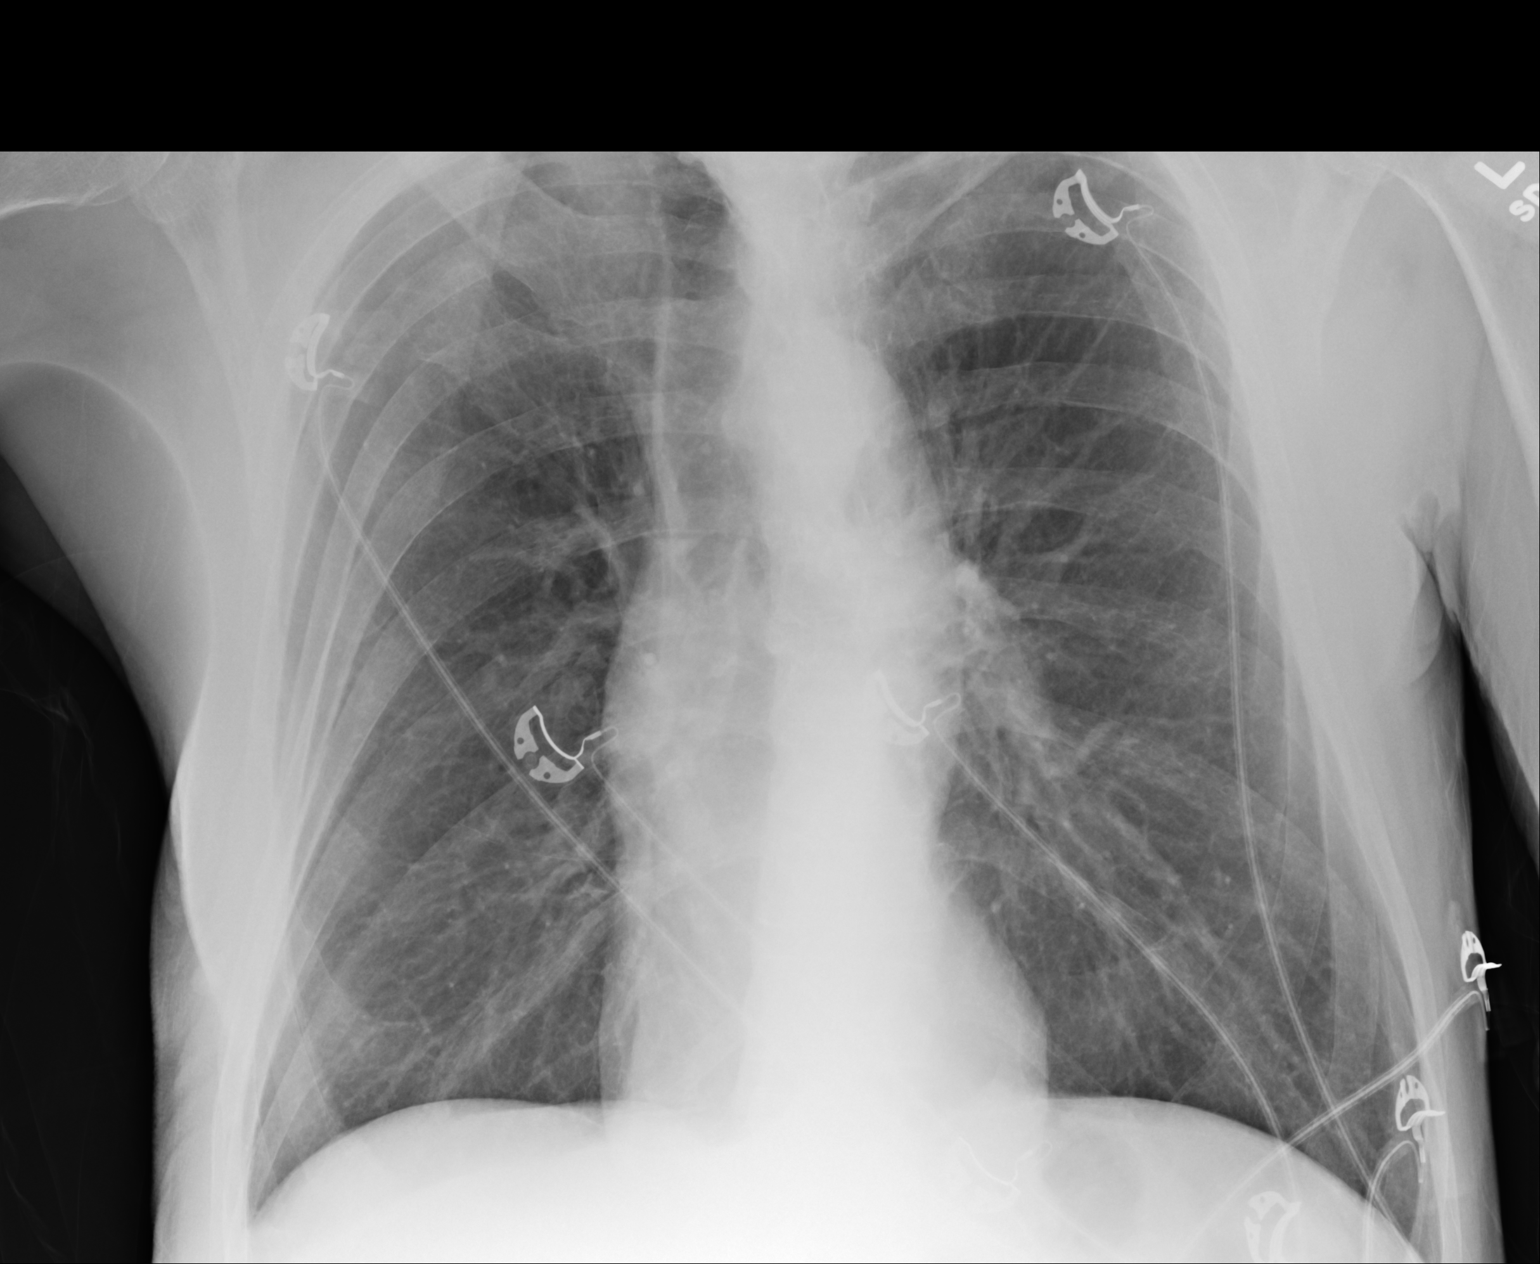

[1 of 1 positions shown; findings below may reference images not displayed]

FINDINGS: There are chronic lung changes bilaterally with likely underlying
emphysematous changes and scattered areas of scarring and
atelectasis. There is no pneumothorax. No large pleural effusion.
The heart size is stable. The aorta may be slightly dilated but is
otherwise unremarkable. The lungs appear hyperexpanded. There is no
acute osseous abnormality detected on this exam. There is a rounded
density projecting over the left mid lung zone which is stable from
prior study and is favored to represent a nipple shadow
IMPRESSION: No active disease.

## 2021-05-18 IMAGING — CR DG FOOT COMPLETE 3+V*R*
3 series · 3 of 3 positions shown · non-contrast
Comparison: None.

CLINICAL DATA: Foot infection.

EXAM:
RIGHT FOOT COMPLETE - 3+ VIEW

[ap]
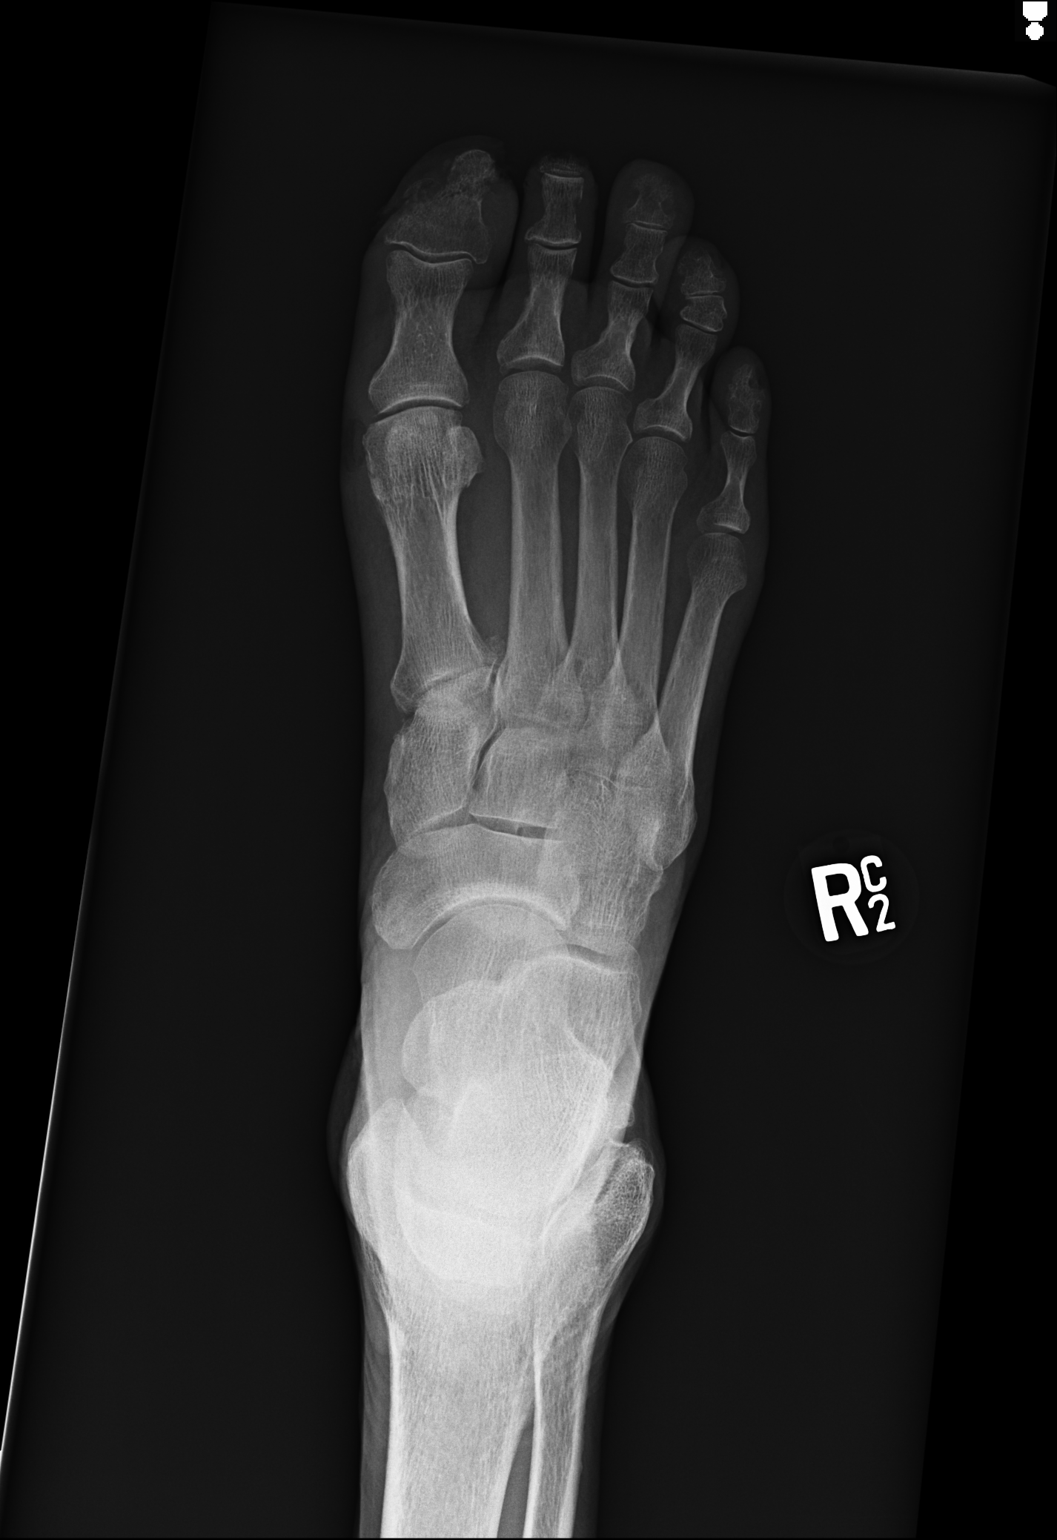

[oblique]
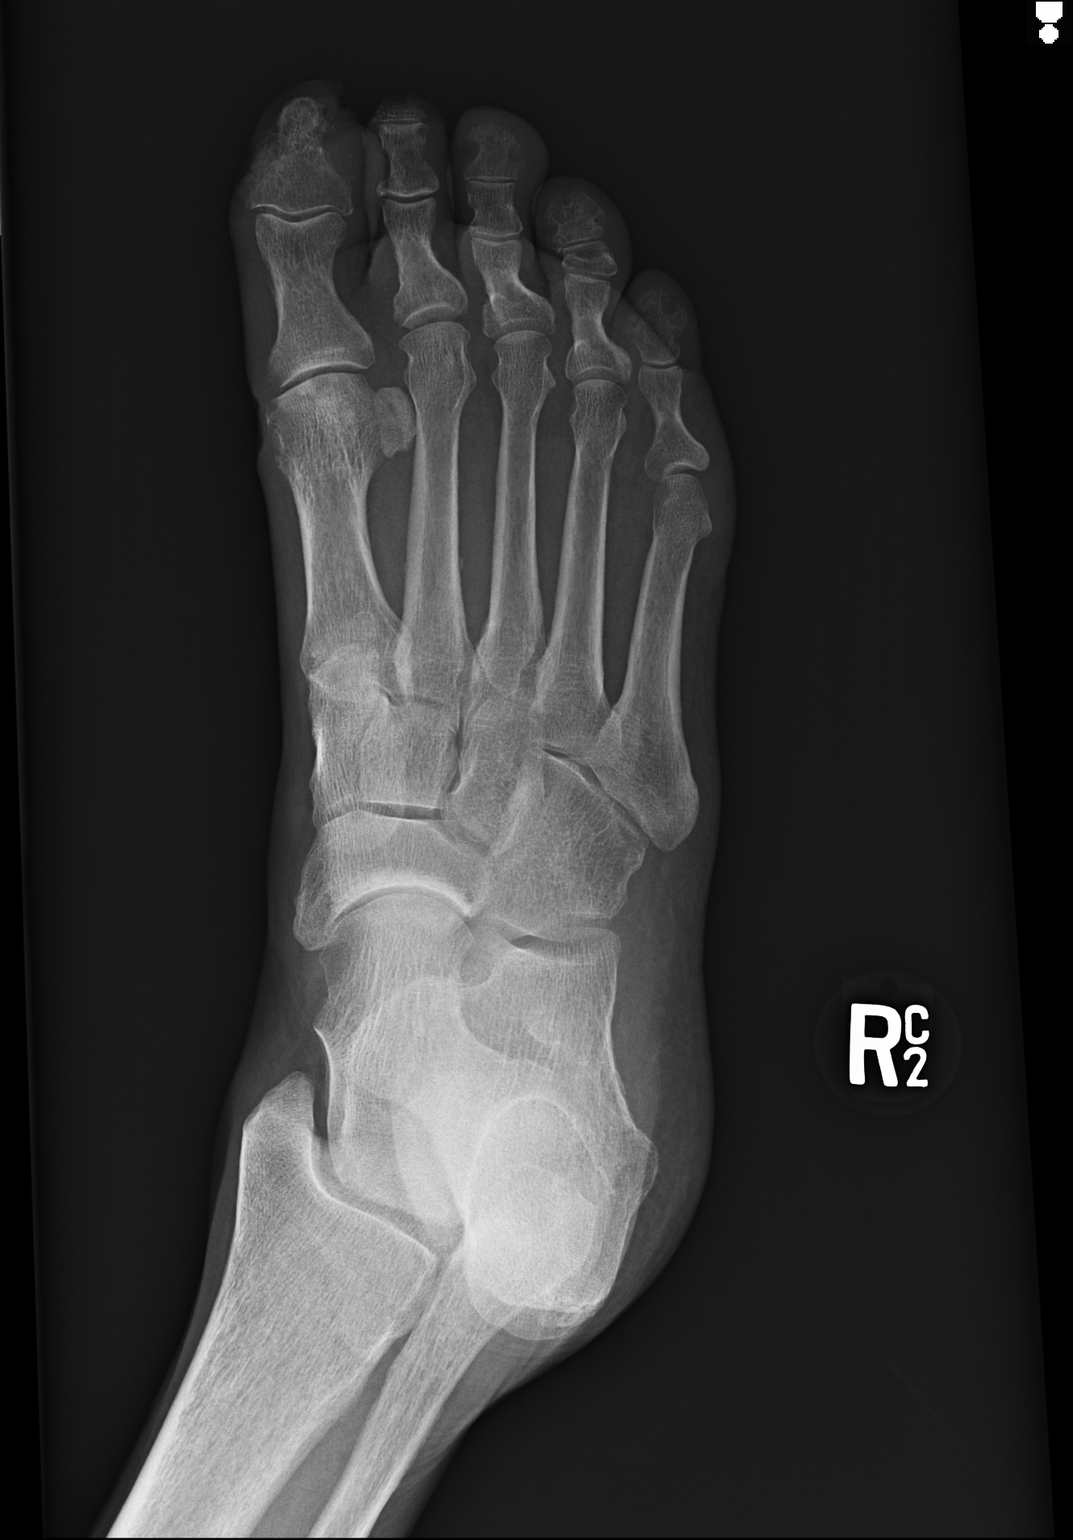

[lat]
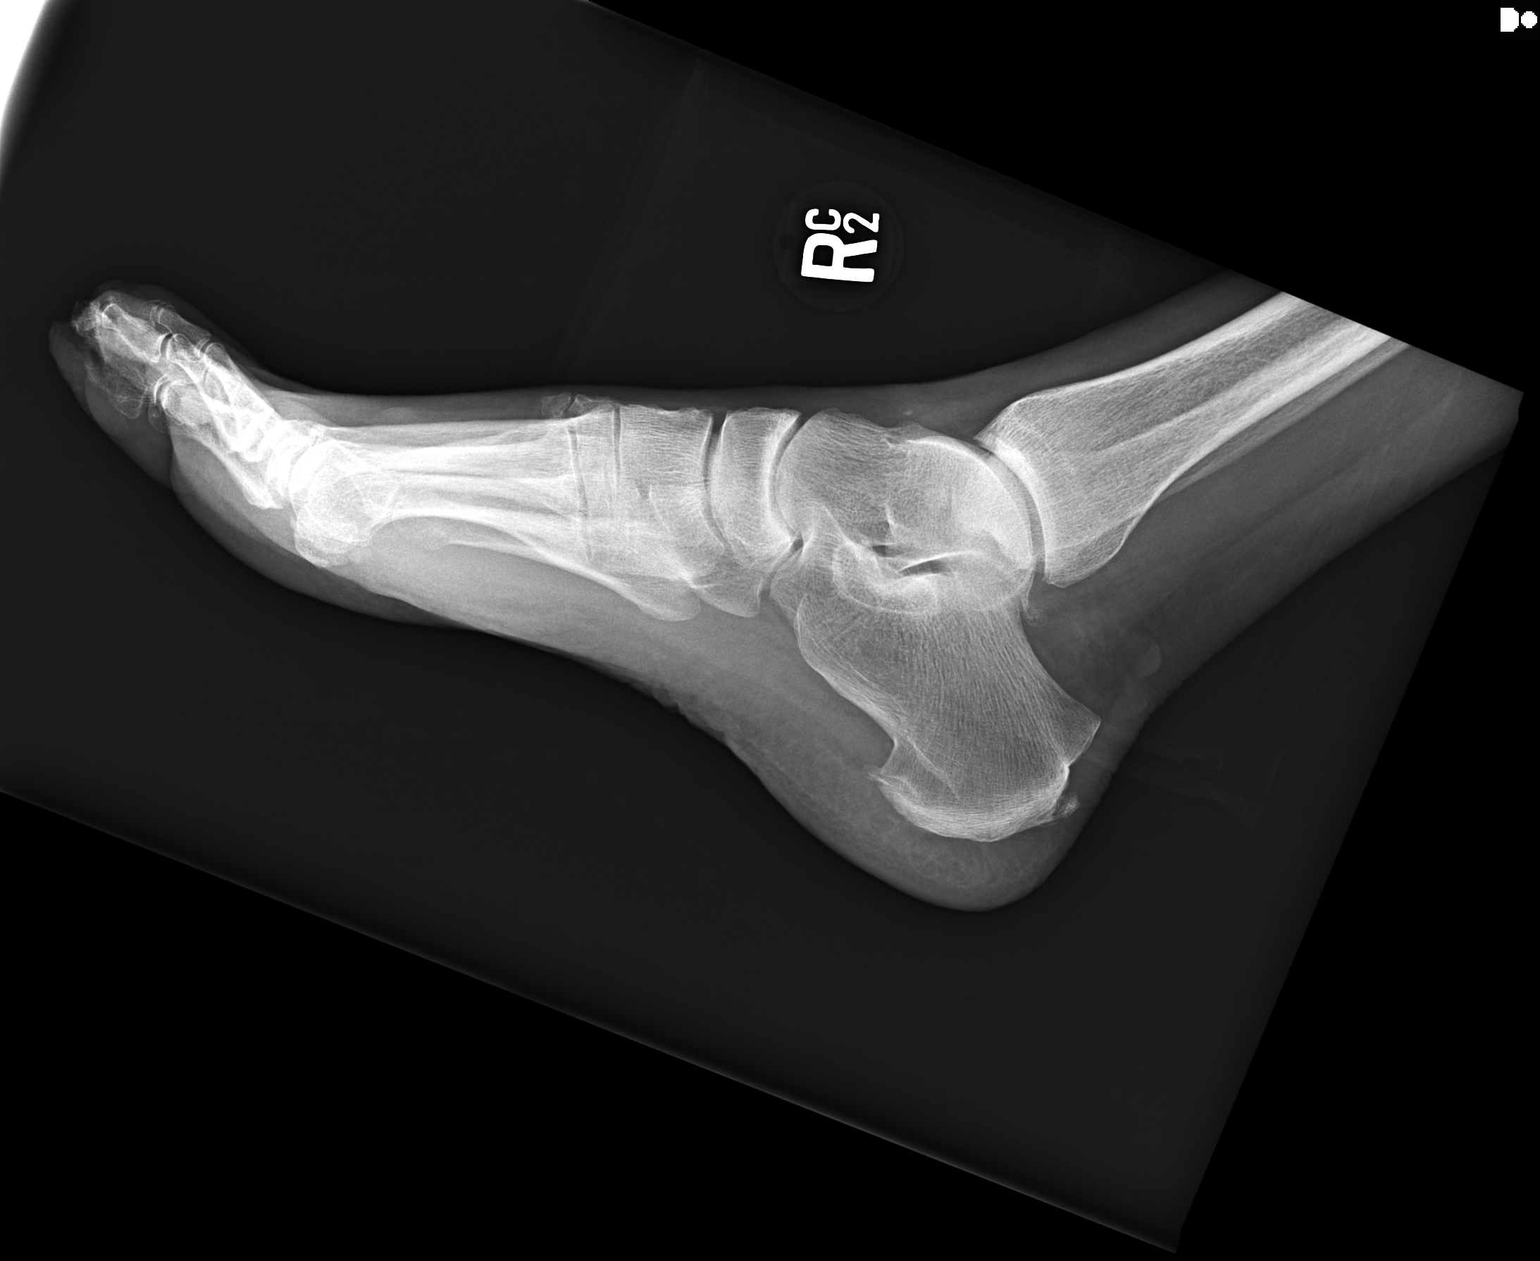

[3 of 3 positions shown; findings below may reference images not displayed]

FINDINGS: There is osseous destruction of the distal phalanx of the first
digit with an associated soft tissue defect. There is destruction or
amputation of the distal phalanx of the second digit. There appears
to be cortical erosions involving the middle phalanx of the second
digit. Multiple ulcerations are noted along the medial aspect of the
foot. There is a plantar calcaneal spur. There are degenerative
changes of the midfoot.
IMPRESSION: 1. Findings concerning for osteomyelitis involving the distal
phalanx of the first digit.
2. Possible osteomyelitis involving the middle phalanx of the second
digit. The majority of the distal phalanx of the second digit is
absent which may be postsurgical or post infectious in etiology.
3. Multiple soft tissue ulcers are noted involving the medial aspect
of the foot.

## 2021-05-18 NOTE — Chronic Care Management (AMB) (Signed)
  Chronic Care Management   Note  05/18/2021 Name: Igor Bishop. MRN: [id number] DOB: 09-08-1951  Sherren Kerns Alexande Sheerin. is a 70 y.o. year old male who is a primary care patient of Luking, Elayne Snare, MD. I reached out to [name] by phone today in response to a referral sent by [name]'s PCP, Dr. Wolfgang Phoenix.     Mr. Kamphaus was given information about Chronic Care Management services today including:  1. CCM service includes personalized support from designated clinical staff supervised by his physician, including individualized plan of care and coordination with other care providers 2. 24/7 contact phone numbers for assistance for urgent and routine care needs. 3. Service will only be billed when office clinical staff spend 20 minutes or more in a month to coordinate care. 4. Only one practitioner may furnish and bill the service in a calendar month. 5. The patient may stop CCM services at any time (effective at the end of the month) by phone call to the office staff. 6. The patient will be responsible for cost sharing (co-pay) of up to 20% of the service fee (after annual deductible is met).  [name], [name] (Guardian) verbally agreed to assistance and services provided by embedded care coordination/care management team today.  Follow up plan: Telephone appointment with care management team member scheduled for:05/24/2021  Lakasha Mcfall  Care Guide, Embedded Care Coordination [place]  Care Management

## 2021-05-24 ENCOUNTER — Ambulatory Visit (INDEPENDENT_AMBULATORY_CARE_PROVIDER_SITE_OTHER): Payer: Medicare Other | Admitting: *Deleted

## 2021-05-24 ENCOUNTER — Other Ambulatory Visit: Payer: Self-pay | Admitting: Family Medicine

## 2021-05-24 DIAGNOSIS — J449 Chronic obstructive pulmonary disease, unspecified: Secondary | ICD-10-CM | POA: Diagnosis not present

## 2021-05-24 DIAGNOSIS — I739 Peripheral vascular disease, unspecified: Secondary | ICD-10-CM

## 2021-05-24 NOTE — Chronic Care Management (AMB) (Signed)
Chronic Care Management   CCM RN Visit Note  05/24/2021 Name: Raymond Walker. MRN: [id number] DOB: Feb 14, 1951  Subjective: [name] is a 70 y.o. year old male who is a primary care patient of Luking, Elayne Snare, MD. The care management team was consulted for assistance with disease management and care coordination needs.    Engaged with patient by telephone for initial visit in response to provider referral for case management and/or care coordination services.   Consent to Services:  The patient was given the following information about Chronic Care Management services today, agreed to services, and gave verbal consent: 1. CCM service includes personalized support from designated clinical staff supervised by the primary care provider, including individualized plan of care and coordination with other care providers 2. 24/7 contact phone numbers for assistance for urgent and routine care needs. 3. Service will only be billed when office clinical staff spend 20 minutes or more in a month to coordinate care. 4. Only one practitioner may furnish and bill the service in a calendar month. 5.The patient may stop CCM services at any time (effective at the end of the month) by phone call to the office staff. 6. The patient will be responsible for cost sharing (co-pay) of up to 20% of the service fee (after annual deductible is met). Patient agreed to services and consent obtained.  Patient agreed to services and verbal consent obtained.   Assessment: Review of patient past medical history, allergies, medications, health status, including review of consultants reports, laboratory and other test data, was performed as part of comprehensive evaluation and provision of chronic care management services.   SDOH (Social Determinants of Health) assessments and interventions performed:  SDOH Interventions   Flowsheet Row Most Recent Value  SDOH Interventions   Food Insecurity Interventions Intervention  Not Indicated  Financial Strain Interventions Intervention Not Indicated  Social Connections Interventions Patient Refused  [per neice [place]  Transportation Interventions Intervention Not Indicated       CCM Care Plan  No Known Allergies  Outpatient Encounter Medications as of 05/24/2021  Medication Sig  . albuterol (VENTOLIN HFA) 108 (90 Base) MCG/ACT inhaler Inhale 2 puffs into the lungs every 4 (four) hours as needed for wheezing or shortness of breath.  . clopidogrel (PLAVIX) 75 MG tablet TAKE 1 TABLET(75 MG) BY MOUTH DAILY WITH BREAKFAST  . pantoprazole (PROTONIX) 40 MG tablet TAKE 1 TABLET(40 MG) BY MOUTH DAILY  . rosuvastatin (CRESTOR) 20 MG tablet TAKE 1 TABLET(20 MG) BY MOUTH DAILY  . traMADol (ULTRAM) 50 MG tablet TAKE 1 TABLET(50 MG) BY MOUTH DAILY AS NEEDED FOR MODERATE PAIN  . sucralfate (CARAFATE) 1 g tablet TAKE 1 TABLET(1 GRAM) BY MOUTH FOUR TIMES DAILY AT BEDTIME WITH MEALS (Patient not taking: Reported on 05/24/2021)   No facility-administered encounter medications on file as of 05/24/2021.    Patient Active Problem List   Diagnosis Date Noted  . Cognitive dysfunction 06/23/2020  . History of seizures 05/31/2020  . Erectile dysfunction 05/02/2020  . Caregiver with fatigue 05/02/2020  . 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 Goals of care, counseling/discussion   . Palliative care by specialist   . DNR (do not resuscitate)   . Vitamin D deficiency 01/24/2020  . Sepsis ([place]) 01/23/2020  . Altered mental status   . Hypokalemia   . History of kidney stones   . Hematuria   . UTI (urinary tract infection)   . CHRONIC Bladder outlet obstruction   .  PAD (peripheral artery disease) ([place]) 09/15/2019  . Protein-calorie malnutrition, severe 08/21/2019  . 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[place])   . Cellulitis of right lower extremity   . 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[place]) 08/19/2019  . Impaired glucose tolerance   . COPD (chronic obstructive pulmonary  disease) ([place])   . Thrombocytopenia ([place]) 08/10/2018  . Aortic atherosclerosis ([place]) 10/14/2016  . Coronary artery calcification seen on CAT scan 10/14/2016  . Other emphysema ([place]) 10/14/2016  . Elevated blood pressure 12/03/2011  . Chest pain 12/03/2011  . Tobacco abuse 12/03/2011  . Right leg pain 12/03/2011    Conditions to be addressed/monitored:COPD, Peripheral Artery Disease  Care Plan : COPD (Adult)  Updates made by Kassie Mends, RN since 05/24/2021 12:00 AM    Problem: Symptom Exacerbation (COPD)   Priority: High    Long-Range Goal: Symptom Exacerbation Prevented or Minimized   Start Date: 05/24/2021  Expected End Date: 11/23/2021  This Visit's Progress: On track  Priority: High  Note:   Current Barriers:  Marland Kitchen Knowledge deficits related to basic understanding of COPD disease process- pt continues to smoke (1 ppd) and not interested in quitting at present. . Knowledge deficits related to basic COPD self care/management- pt needs reinforcement about COPD action plan/ importance of calling MD early for change in health status, getting medications refilled on time (pt has appointment with primary care provider on 05/29/21 to get all medications refilled). Niece [name] reports she called in refills today for patient's medications and will follow up to make sure pt gets these as pt may be out of some of the medications due to not getting refilled, pt does have inhaler. . Literacy barriers- pt has third grade education and needs assistance with reading, filling out forms, etc. . Does not adhere to provider recommendations re: physical activity, getting out and being more mobile . Lacks social connections- pt does not attend social activities such as church, organizations although niece has encouraged him to do so.  Niece also feels pt could benefit from therapy/ counseling but at present pt is not interested.  Niece [name] is Economist and talks with pt every few weeks, pt has adult  children but does not have relationship with them, pt has had life long issues with family dysfunction and other personal problems (pt has had social work services within the past 6 months) No social work needs identified today. Case Manager Clinical Goal(s):  patient will verbalize understanding of COPD action plan and when to seek appropriate levels of medical care  Interventions:  . Collaboration with [name], MD regarding development and update of comprehensive plan of care as evidenced by provider attestation and co-signature . Inter-disciplinary care team collaboration (see longitudinal plan of care)  Provided patient with basic written and verbal COPD education on self care/management/and exacerbation prevention   Provided instruction about proper use of medications used for management of COPD including inhalers  Provided education about and advised patient to utilize infection prevention strategies to reduce risk of respiratory infection  Encouraged smoking cessation, talked with niece (pt not ready at present)  Encouraged that pt participate in some type of exercise such as walking even if for a few minutes, (pt is inactive and does not seem interested in exercise)  Reviewed upcoming appointments- Dr. Wolfgang Phoenix 05/29/21  Reviewed importance of pt not running out of any medications and keeping timely refills  Self-Care Activities:  Attends all scheduled provider appointments Calls pharmacy for medication refills Calls provider  office for new concerns or questions Patient Goals: - identify and remove indoor air pollutants - limit outdoor activity during cold weather- develop a rescue plan - follow rescue plan if symptoms flare-up - call doctor early on for change in health status/ symptoms such as cough, increased phlegm, etc.  - take all medications as prescribed and keep refilled on time - keep follow-up appointments  Follow Up Plan: Telephone follow up appointment with  care management team member scheduled for:  06/21/2021    Care Plan : Peripheral Artery Disease (PAD)  Updates made by Kassie Mends, RN since 05/24/2021 12:00 AM    Problem: Health Promotion or Disease Self-Management for peripheral artery disease   Priority: High    Long-Range Goal: Self management plan developed for peripheral artery disease   Start Date: 05/24/2021  Expected End Date: 11/23/2021  This Visit's Progress: On track  Priority: High  Note:   Current Barriers:   Ineffective Self Health Maintenance - pt with history PAD, 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  Does not adhere to provider recommendations re: smoking cessation, exercise.  Lacks social connections- pt is not consistently involved socially with activities such as church, organizations/ clubs, friends.  Does not contact provider office for questions/concerns- pt needs reminders to make sure he does not run out of medication and keeps timely refills (niece assists and will make sure pt has all medications)  Niece is HCPOA, pt does not have relationship with adult children. Clinical Goal(s):  Marland Kitchen Collaboration with [name], MD regarding development and update of comprehensive plan of care as evidenced by provider attestation and co-signature . Inter-disciplinary care team collaboration (see longitudinal plan of care)  patient will work with care management team to address care coordination and chronic disease management needs related to Disease Management  Educational Needs  Care Coordination  Medication Management and Education   Interventions:   Evaluation of current treatment plan related to peripheral artery disease, Limited social support, Family and relationship dysfunction, and Literacy concerns self-management and patient's adherence to plan as established by provider.  Collaboration with [name], MD  regarding development and update of comprehensive plan of care as evidenced by provider attestation       and co-signature  Inter-disciplinary care team collaboration (see longitudinal plan of care)  Discussed plans with patient for ongoing care management follow up and provided patient with direct contact information for care management team  Education sent via My Chart on Peripheral Artery Disease   Reviewed importance of smoking cessation  Reviewed medications and importance of taking as prescribed  Reviewed importance of taking tramadol as needed for pain  Reviewed services offered social work and pharmacy and declined at present  RN CM contact number given  Reviewed all upcoming doctor's appointments Self Care Activities:  . Attends all scheduled provider appointments . Calls pharmacy for medication refills . Attends church or other social activities . Calls provider office for new concerns or questions Patient Goals: - discuss my treatment options with the doctor or nurse and make shared treatment decisions - spend time outdoors at least 3 times a week - strengthen or fix relationships with loved ones  - consider therapy/ counseling - consider church or other organizations in your community to be a part of - smoking worsens peripheral artery disease - cut down on smoking or consider quitting - eat a healthy diet (  lean meat, fresh or frozen vegetables if possible) limit junk food Follow Up Plan: Telephone follow up appointment with care management team member scheduled for:  06/21/2021      Plan:Telephone follow up appointment with care management team member scheduled for:  06/21/2021  Jacqlyn Larsen [facility], BSN RN Case Manager [place] [phone]

## 2021-05-24 NOTE — Patient Instructions (Signed)
Visit Information   PATIENT GOALS:  Goals Addressed            This Visit's Progress   . Maintain My Quality of Life related to PAD       Timeframe:  Long-Range Goal Priority:  High Start Date:        05/24/2021                     Expected End Date:    11/23/2021                   Follow Up Date 06/21/2021   - discuss my treatment options with the doctor or nurse and make shared treatment      decisions - spend time outdoors at least 3 times a week - strengthen or fix relationships with loved ones  - consider therapy/ counseling - consider church or other organizations in your community to be a part of - smoking worsens peripheral artery disease - cut down on smoking or consider quitting - eat a healthy diet (lean meat, fresh or frozen vegetables if possible) limit junk food    Why is this important?    Having a long-term illness can be scary.   It can also be stressful for you and your caregiver.   These steps may help.    Notes:     . Track and Manage My Symptoms-COPD       Timeframe:  Long-Range Goal Priority:  High Start Date:           05/24/2021                  Expected End Date:     11/23/2021                  Follow Up Date- 06/21/2021   - develop a rescue plan - follow rescue plan if symptoms flare-up - call doctor early on for change in health status/ symptoms such as cough, increased phlegm, etc.  - take all medications as prescribed and keep refilled on time - keep follow-up appointments    Why is this important?    Tracking your symptoms and other information about your health helps your doctor plan your care.   Write down the symptoms, the time of day, what you were doing and what medicine you are taking.   You will soon learn how to manage your symptoms.     Notes:        Consent to CCM Services: Raymond Walker was given information about Chronic Care Management services today including:  1. CCM service includes personalized support from designated  clinical staff supervised by his physician, including individualized plan of care and coordination with other care providers 2. 24/7 contact phone numbers for assistance for urgent and routine care needs. 3. Service will only be billed when office clinical staff spend 20 minutes or more in a month to coordinate care. 4. Only one practitioner may furnish and bill the service in a calendar month. 5. The patient may stop CCM services at any time (effective at the end of the month) by phone call to the office staff. 6. The patient will be responsible for cost sharing (co-pay) of up to 20% of the service fee (after annual deductible is met).  Patient agreed to services and verbal consent obtained.   Patient verbalizes understanding of instructions provided today and agrees to view in [place].   Telephone follow up appointment with  care management team member scheduled for:  06/21/2021  Jacqlyn Larsen [facility], BSN RN Case Manager [facility] [phone]   CLINICAL CARE PLAN: Patient Care Plan: COPD (Adult)    Problem Identified: Symptom Exacerbation (COPD)   Priority: High    Long-Range Goal: Symptom Exacerbation Prevented or Minimized   Start Date: 05/24/2021  Expected End Date: 11/23/2021  This Visit's Progress: On track  Priority: High  Note:   Current Barriers:  Marland Kitchen Knowledge deficits related to basic understanding of COPD disease process- pt continues to smoke (1 ppd) and not interested in quitting at present. . Knowledge deficits related to basic COPD self care/management- pt needs reinforcement about COPD action plan/ importance of calling MD early for change in health status, getting medications refilled on time (pt has appointment with primary care provider on 05/29/21 to get all medications refilled). Niece [name] reports she called in refills today for patient's medications and will follow up to make sure pt gets these as pt may be out of some of the medications due to not  getting refilled, pt does have inhaler. . Literacy barriers- pt has third grade education and needs assistance with reading, filling out forms, etc. . Does not adhere to provider recommendations re: physical activity, getting out and being more mobile . Lacks social connections- pt does not attend social activities such as church, organizations although niece has encouraged him to do so.  Niece also feels pt could benefit from therapy/ counseling but at present pt is not interested.  Niece [name] is Economist and talks with pt every few weeks, pt has adult children but does not have relationship with them, pt has had life long issues with family dysfunction and other personal problems (pt has had social work services within the past 6 months) No social work needs identified today. Case Manager Clinical Goal(s):  patient will verbalize understanding of COPD action plan and when to seek appropriate levels of medical care  Interventions:  . Collaboration with [name], MD regarding development and update of comprehensive plan of care as evidenced by provider attestation and co-signature . Inter-disciplinary care team collaboration (see longitudinal plan of care)  Provided patient with basic written and verbal COPD education on self care/management/and exacerbation prevention   Provided instruction about proper use of medications used for management of COPD including inhalers  Provided education about and advised patient to utilize infection prevention strategies to reduce risk of respiratory infection  Encouraged smoking cessation, talked with niece (pt not ready at present)  Encouraged that pt participate in some type of exercise such as walking even if for a few minutes, (pt is inactive and does not seem interested in exercise)  Reviewed upcoming appointments- Dr. Wolfgang Phoenix 05/29/21  Reviewed importance of pt not running out of any medications and keeping timely refills  Self-Care  Activities:  Attends all scheduled provider appointments Calls pharmacy for medication refills Calls provider office for new concerns or questions Patient Goals: - identify and remove indoor air pollutants - limit outdoor activity during cold weather- develop a rescue plan - follow rescue plan if symptoms flare-up - call doctor early on for change in health status/ symptoms such as cough, increased phlegm, etc.  - take all medications as prescribed and keep refilled on time - keep follow-up appointments  Follow Up Plan: Telephone follow up appointment with care management team member scheduled for:  06/21/2021    Patient Care Plan: Peripheral Artery Disease (PAD)    Problem Identified: Health  Promotion or Disease Self-Management for peripheral artery disease   Priority: High    Long-Range Goal: Self management plan developed for peripheral artery disease   Start Date: 05/24/2021  Expected End Date: 11/23/2021  This Visit's Progress: On track  Priority: High  Note:   Current Barriers:   Ineffective Self Health Maintenance - pt with history PAD, 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  Does not adhere to provider recommendations re: smoking cessation, exercise.  Lacks social connections- pt is not consistently involved socially with activities such as church, organizations/ clubs, friends.  Does not contact provider office for questions/concerns- pt needs reminders to make sure he does not run out of medication and keeps timely refills (niece assists and will make sure pt has all medications)  Niece is HCPOA, pt does not have relationship with adult children. Clinical Goal(s):  Marland Kitchen Collaboration with [name], MD regarding development and update of comprehensive plan of care as evidenced by provider attestation and co-signature . Inter-disciplinary care team collaboration (see longitudinal plan  of care)  patient will work with care management team to address care coordination and chronic disease management needs related to Disease Management  Educational Needs  Care Coordination  Medication Management and Education   Interventions:   Evaluation of current treatment plan related to peripheral artery disease, Limited social support, Family and relationship dysfunction, and Literacy concerns self-management and patient's adherence to plan as established by provider.  Collaboration with [name], MD regarding development and update of comprehensive plan of care as evidenced by provider attestation       and co-signature  Inter-disciplinary care team collaboration (see longitudinal plan of care)  Discussed plans with patient for ongoing care management follow up and provided patient with direct contact information for care management team  Education sent via My Chart on Peripheral Artery Disease   Reviewed importance of smoking cessation  Reviewed medications and importance of taking as prescribed  Reviewed importance of taking tramadol as needed for pain  Reviewed services offered social work and pharmacy and declined at present  RN CM contact number given  Reviewed all upcoming doctor's appointments Self Care Activities:  . Attends all scheduled provider appointments . Calls pharmacy for medication refills . Attends church or other social activities . Calls provider office for new concerns or questions Patient Goals: - discuss my treatment options with the doctor or nurse and make shared treatment decisions - spend time outdoors at least 3 times a week - strengthen or fix relationships with loved ones  - consider therapy/ counseling - consider church or other organizations in your community to be a part of - smoking worsens peripheral artery disease - cut down on smoking or consider quitting - eat a healthy diet (lean meat, fresh or frozen vegetables if  possible) limit junk food Follow Up Plan: Telephone follow up appointment with care management team member scheduled for:  06/21/2021       Atherosclerosis  Atherosclerosis is narrowing and hardening of the arteries. Arteries are blood vessels that carry blood from the heart to all parts of the body. This blood contains oxygen. Arteries can become narrow or blocked from inflammation or from a buildup of fat, cholesterol, calcium, and other substances (plaque). Plaque decreases the amount of blood that can flow through the artery. Atherosclerosis can affect any artery in your body, including:  Heart arteries. Damage to these  arteries may lead to coronary artery disease, which can cause a heart attack.  Brain arteries. Damage to these arteries may cause a stroke.  Leg, arm, and pelvis arteries. Peripheral artery disease (PAD) may result from damage to these arteries.  Kidney arteries. Kidney (renal) failure may result from damage to kidney arteries. Treatment may slow the disease and prevent further damage to your heart, brain, peripheral arteries, and kidneys. What are the causes? This condition develops slowly over many years. The inner layers of your arteries become damaged and allow the gradual buildup of plaque. The exact cause of atherosclerosis is not fully understood. Symptoms of atherosclerosis do not occur until an artery becomes narrow or blocked. What increases the risk? The following factors may make you more likely to develop this condition:  Being middle-aged or older.  Certain medical conditions, including: ? High blood pressure. ? High cholesterol. ? High blood fats (triglycerides). ? Diabetes. ? Sleep apnea.  A family history of atherosclerosis.  Being overweight.  Using products that contain tobacco or nicotine.  Not exercising enough (sedentary lifestyle).  Having a substance in your blood called C-reactive protein (CRP). This is a sign of increased levels of  inflammation in your body.  Being stressed.  Drinking too much alcohol or using drugs, such as cocaine or methamphetamine. What are the signs or symptoms? Symptoms of atherosclerosis may not be obvious until there is damage to an area of your body that is not getting enough blood. Sometimes, atherosclerosis does not cause symptoms. Symptoms of this condition include:  Coronary artery disease. This may cause chest pain and shortness of breath.  Decreased blood supply to your brain, which may cause a stroke. Signs of a stroke may include sudden: ? Weakness or numbness in your face, arm, or leg, especially on one side of your body. ? Trouble walking or difficulty moving your arms or legs. ? Loss of balance or coordination. ? Confusion. ? Slurred speech (dysarthria). ? Trouble speaking, or trouble understanding speech, or both (aphasia). ? Vision changes in one or both eyes. This may be double vision, blurred vision, or loss of vision. ? Severe headache with no known cause. The headache is often described as the worst headache ever experienced.  PAD, which may cause pain and numbness, often in your legs and hips.  Renal failure. This may cause tiredness, problems with urination, swelling, and itchy skin. How is this diagnosed? This condition is diagnosed based on your medical history and a physical exam. During the exam, your health care provider will:  Check your pulse in different places.  Listen for a "whooshing" sound over your arteries (bruit). You may also have tests, such as:  Blood tests to check your levels of cholesterol, triglycerides, and CRP.  Electrocardiogram (ECG) to check for heart damage.  Chest X-ray to see if you have an enlarged heart, which is a sign of heart failure.  Stress test to see how your heart reacts to exercise.  Echocardiogram to get images of the inside of your heart.  Ankle-brachial index to compare blood pressure in your arms to blood pressure  in your ankles.  Ultrasound of your peripheral arteries to check blood flow.  CT scan to check for damage to your heart or brain.  X-rays of blood vessels after dye has been injected (angiogram) to check blood flow. How is this treated? This condition is treated with lifestyle changes as the first step. These may include:  Changing your diet.  Losing weight.  Reducing stress.  Exercising and being physically active more regularly.  Quitting smoking. You may also need medicine to:  Lower triglycerides and cholesterol.  Control blood pressure.  Prevent blood clots.  Lower inflammation in your body.  Control your blood sugar. Sometimes, surgery is needed to:  Remove plaque from an artery (endarterectomy).  Open or widen a narrowed heart artery (angioplasty).  Create a new path for your blood with one of these procedures: ? Heart (coronary) artery bypass graft surgery. ? Peripheral artery bypass graft surgery. Follow these instructions at home: Eating and drinking  Eat a heart-healthy diet. Talk with your health care provider or a diet and nutrition specialist (dietitian) if you need help. A heart-healthy diet involves: ? Limiting unhealthy fats and increasing healthy fats. Some examples of healthy fats are olive oil and canola oil. ? Eating plant-based foods, such as fruits, vegetables, nuts, whole grains, and legumes (such as peas and lentils).  If you drink alcohol: ? Limit how much you use to:  0-1 drink a day for nonpregnant women.  0-2 drinks a day for men. ? Be aware of how much alcohol is in your drink. In the [place] one drink equals one 12 oz bottle of beer (355 mL), one 5 oz glass of wine (148 mL), or one 1 oz glass of hard liquor (44 mL).   Lifestyle  Maintain a healthy weight. Lose weight if your health care provider says that you need to do that.  Follow an exercise program as told by your health care provider.  Do not use any products that contain  nicotine or tobacco, such as cigarettes, e-cigarettes, and chewing tobacco. If you need help quitting, ask your health care provider.  Do not use drugs.   General instructions  Take over-the-counter and prescription medicines only as told by your health care provider.  Manage other health conditions as told.  Keep all follow-up visits as told by your health care provider. This is important. Contact a health care provider if you have:  An irregular heartbeat.  Unexplained fatigue.  Trouble urinating, or you are producing less urine or foamy urine.  Swelling of your hands or feet, or itchy skin.  Unexplained pain or numbness in your legs or hips. Get help right away if:  You have any symptoms of a heart attack. These may be: ? Chest pain. This includes squeezing chest pain that may feel like indigestion (angina). ? Shortness of breath. ? Pain in your neck, jaw, arms, back, or stomach. ? Cold sweat. ? Nausea. ? Light-headedness.  You have any symptoms of a stroke. "BE FAST" is an easy way to remember the main warning signs of a stroke: ? B - Balance. Signs are dizziness, sudden trouble walking, or loss of balance. ? E - Eyes. Signs are trouble seeing or a sudden change in vision. ? F - Face. Signs are sudden weakness or numbness of the face, or the face or eyelid drooping on one side. ? A - Arms. Signs are weakness or numbness in an arm. This happens suddenly and usually on one side of the body. ? S - Speech. Signs are sudden trouble speaking, slurred speech, or trouble understanding what people say. ? T - Time. Time to call emergency services. Write down what time symptoms started.  You have other signs of a stroke, such as: ? A sudden, severe headache with no known cause. ? Nausea or vomiting. ? Seizure. These symptoms may represent a serious problem that is  an emergency. Do not wait to see if the symptoms will go away. Get medical help right away. Call your local emergency  services (911 in the [place] Do not drive yourself to the hospital. Summary  Atherosclerosis is narrowing and hardening of the arteries.  Arteries can become narrow from inflammation or from a buildup of fat, cholesterol, calcium, and other substances (plaque).  This condition may not cause any symptoms. If you do have symptoms, they are caused by damage to an area of your body that is not getting enough blood.  Treatment starts with lifestyle changes and may include medicines. In some cases, surgery is needed.  Get help right away if you have any symptoms of a heart attack or stroke. This information is not intended to replace advice given to you by your health care provider. Make sure you discuss any questions you have with your health care provider. Document Revised: 10/12/2019 Document Reviewed: 10/12/2019 Elsevier Patient Education  2021 [place].  COPD Action Plan A COPD action plan is a description of what to do when you have a flare (exacerbation) of chronic obstructive pulmonary disease (COPD). Your action plan is a color-coded plan that lists the symptoms that indicate whether your condition is under control and what actions to take.  If you have symptoms in the green zone, it means you are doing well that day.  If you have symptoms in the yellow zone, it means you are having a bad day or an exacerbation.  If you have symptoms in the red zone, you need urgent medical care. Follow the plan that you and your health care provider developed. Review your plan with your health care provider at each visit. Red zone Symptoms in this zone mean that you should get medical help right away. They include:  Feeling very short of breath, even when you are resting.  Not being able to do any activities because of poor breathing.  Not being able to sleep because of poor breathing.  Fever or shaking chills.  Feeling confused or very sleepy.  Chest pain.  Coughing up blood. If you  have any of these symptoms, call emergency services (911 in the [place] or go to the nearest emergency room.   Yellow zone Symptoms in this zone mean that your condition may be getting worse. They include:  Feeling more short of breath than usual.  Having less energy for daily activities than usual.  Phlegm or mucus that is thicker than usual.  Needing to use your rescue inhaler or nebulizer more often than usual.  More ankle swelling than usual.  Coughing more than usual.  Feeling like you have a chest cold.  Trouble sleeping due to COPD symptoms.  Decreased appetite.  COPD medicines not helping as much as usual. If you experience any "yellow" symptoms:  Keep taking your daily medicines as directed.  Use your quick-relief inhaler as told by your health care provider.  If you were prescribed steroid medicine to take by mouth (oral medicine), start taking it as told by your health care provider.  If you were prescribed an antibiotic medicine, start taking it as told by your health care provider. Do not stop taking the antibiotic even if you start to feel better.  Use oxygen as told by your health care provider.  Get more rest.  Do your pursed-lip breathing exercises.  Do not smoke. Avoid any irritants in the air. If your signs and symptoms do not improve after taking these steps, call  your health care provider right away.   Green zone Symptoms in this zone mean that you are doing well. They include:  Being able to do your usual activities and exercise.  Having the usual amount of coughing, including the same amount of phlegm or mucus.  Being able to sleep well.  Having a good appetite.   Where to find more information: You can find more information about COPD from:  American Lung Association, My COPD Action Plan: www.lung.org  COPD Foundation: www.copdfoundation.[place]: https://wilson-[place].com/ Follow these instructions at  home:  Continue taking your daily medicines as told by your health care provider.  Make sure you receive all the immunizations that your health care provider recommends, especially the pneumococcal and influenza vaccines.  Wash your hands often with soap and water. Have family members wash their hands too. Regular hand washing can help prevent infections.  Follow your usual exercise and diet plan.  Avoid irritants in the air, such as smoke.  Do not use any products that contain nicotine or tobacco. These products include cigarettes, chewing tobacco, and vaping devices, such as e-cigarettes. If you need help quitting, ask your health care provider. Summary  A COPD action plan tells you what to do when you have a flare (exacerbation) of chronic obstructive pulmonary disease (COPD).  Follow each action plan for your symptoms. If you have any symptoms in the red zone, call emergency services (911 in the [place] or go to the nearest emergency room. This information is not intended to replace advice given to you by your health care provider. Make sure you discuss any questions you have with your health care provider. Document Revised: 10/11/2020 Document Reviewed: 10/11/2020 Elsevier Patient Education  2021 [employer].

## 2021-05-25 ENCOUNTER — Other Ambulatory Visit: Payer: Self-pay | Admitting: Family Medicine

## 2021-05-28 NOTE — Telephone Encounter (Signed)
Needs follow-up visit this summer Clip Regelle-Plavix is a duplicate PPI may have 6 months refill

## 2021-05-29 ENCOUNTER — Other Ambulatory Visit: Payer: Self-pay

## 2021-05-29 ENCOUNTER — Ambulatory Visit (INDEPENDENT_AMBULATORY_CARE_PROVIDER_SITE_OTHER): Payer: Medicare Other | Admitting: Family Medicine

## 2021-05-29 VITALS — BP 131/78 | HR 88 | Temp 98.8°F | Ht 72.0 in | Wt 155.0 lb

## 2021-05-29 DIAGNOSIS — I739 Peripheral vascular disease, unspecified: Secondary | ICD-10-CM | POA: Diagnosis not present

## 2021-05-29 DIAGNOSIS — I700 Atherosclerosis of aorta: Secondary | ICD-10-CM | POA: Diagnosis not present

## 2021-05-29 DIAGNOSIS — R7303 Prediabetes: Secondary | ICD-10-CM

## 2021-05-29 DIAGNOSIS — R911 Solitary pulmonary nodule: Secondary | ICD-10-CM | POA: Diagnosis not present

## 2021-05-29 DIAGNOSIS — Z720 Tobacco use: Secondary | ICD-10-CM | POA: Diagnosis not present

## 2021-05-29 DIAGNOSIS — E785 Hyperlipidemia, unspecified: Secondary | ICD-10-CM | POA: Diagnosis not present

## 2021-05-29 DIAGNOSIS — J449 Chronic obstructive pulmonary disease, unspecified: Secondary | ICD-10-CM

## 2021-05-29 MED ORDER — CLOPIDOGREL BISULFATE 75 MG PO TABS: ORAL_TABLET | ORAL | 3 refills | Status: DC

## 2021-05-29 MED ORDER — PANTOPRAZOLE SODIUM 40 MG PO TBEC: DELAYED_RELEASE_TABLET | ORAL | 4 refills | Status: DC

## 2021-05-29 MED ORDER — SILDENAFIL CITRATE 100 MG PO TABS: 50.0000 mg | ORAL_TABLET | Freq: Every day | ORAL | 4 refills | Status: DC | PRN

## 2021-05-29 NOTE — Telephone Encounter (Signed)
Pt was in today for med refills

## 2021-05-29 NOTE — Progress Notes (Signed)
   Subjective:    Patient ID: Raymond Radon., male    DOB: Dec 11, 1951, 70 y.o.   MRN: [id number]  HPI   Medication check Contacted pharmacy to confirm current medication refills   Review of Systems     Objective:   Physical Exam  Lungs clear heart regular pulse normal extremities no edema skin warm dry recheck 4 months      Assessment & Plan:  1. Pulmonary nodule Patient needs to have CT scan of the chest.  He had 1 last June and it was recommended to have another one in approximately 3 months.  This was notified to his niece who is handling his affairs at that time.  The patient states that she did never told him.  The CT scan was ordered.  And the patient states that he was never aware of it.  In addition to this this was put into the reminder file and the CT was ordered last December but once again patient states he was never notified.  So therefore he needs to have a CT scan we will go forward with ordering it at this time.  Patient has been counseled to quit smoking it is unlikely that he will quit smoking. - Hemoglobin A1c - PSA - Comprehensive metabolic panel - Lipid panel - CT Chest Wo Contrast  2. Chronic obstructive pulmonary disease, unspecified COPD type ([place]) Patient states his breathing seems to be okay denies any hemoptysis coughing denies shortness of breath unless he really pushes himself - Hemoglobin A1c - PSA - Comprehensive metabolic panel - Lipid panel - CT Chest Wo Contrast  3. PAD (peripheral artery disease) (HCC) Does have a history of peripheral artery disease in his legs stable currently.  In addition to this patient states he is started utilizing his Plavix every other day because he states when he takes it every single day he bleeds too much with skin cuts or irritation - Hemoglobin A1c - PSA - Comprehensive metabolic panel - Lipid panel - CT Chest Wo Contrast  4. Tobacco abuse Patient was counseled to quit smoking unlikely he will do so.   Very nice gentleman. - Hemoglobin A1c - PSA - Comprehensive metabolic panel - Lipid panel - CT Chest Wo Contrast  5. Aortic atherosclerosis (HCC) History of aortic arch atherosclerosis.  Continue cholesterol medicine check lipid profile await results  6. Hyperlipidemia, unspecified hyperlipidemia type Hyperlipidemia check lab work await results  7. Prediabetes History of prediabetes minimize starches in the diet stay physically active check labs await results  Patient was explicitly told that if he does not hear anything regarding his CT scan which is within the next 2 weeks to notify us

## 2021-06-02 DIAGNOSIS — R911 Solitary pulmonary nodule: Secondary | ICD-10-CM | POA: Diagnosis not present

## 2021-06-02 DIAGNOSIS — R7302 Impaired glucose tolerance (oral): Secondary | ICD-10-CM | POA: Diagnosis not present

## 2021-06-02 DIAGNOSIS — I739 Peripheral vascular disease, unspecified: Secondary | ICD-10-CM | POA: Diagnosis not present

## 2021-06-02 DIAGNOSIS — J449 Chronic obstructive pulmonary disease, unspecified: Secondary | ICD-10-CM | POA: Diagnosis not present

## 2021-06-02 DIAGNOSIS — E870 Hyperosmolality and hypernatremia: Secondary | ICD-10-CM | POA: Diagnosis not present

## 2021-06-02 DIAGNOSIS — E876 Hypokalemia: Secondary | ICD-10-CM | POA: Diagnosis not present

## 2021-06-02 DIAGNOSIS — Z720 Tobacco use: Secondary | ICD-10-CM | POA: Diagnosis not present

## 2021-06-02 DIAGNOSIS — E785 Hyperlipidemia, unspecified: Secondary | ICD-10-CM | POA: Diagnosis not present

## 2021-06-03 LAB — LIPID PANEL
Chol/HDL Ratio: 3.6 ratio (ref 0.0–5.0)
Cholesterol, Total: 113 mg/dL (ref 100–199)
HDL: 31 mg/dL — ABNORMAL LOW (ref >39)
LDL Chol Calc (NIH): 66 mg/dL (ref 0–99)
Triglycerides: 79 mg/dL (ref 0–149)
VLDL Cholesterol Cal: 16 mg/dL (ref 5–40)

## 2021-06-03 LAB — COMPREHENSIVE METABOLIC PANEL
ALT: 11 IU/L (ref 0–44)
AST: 10 IU/L (ref 0–40)
Albumin/Globulin Ratio: 1.2 (ref 1.2–2.2)
Albumin: 3.9 g/dL (ref 3.7–4.7)
Alkaline Phosphatase: 86 IU/L (ref 44–121)
BUN/Creatinine Ratio: 17 (ref 10–24)
BUN: 14 mg/dL (ref 8–27)
Bilirubin Total: 0.5 mg/dL (ref 0.0–1.2)
CO2: 25 mmol/L (ref 20–29)
Calcium: 9.5 mg/dL (ref 8.6–10.2)
Chloride: 99 mmol/L (ref 96–106)
Creatinine, Ser: 0.82 mg/dL (ref 0.76–1.27)
Globulin, Total: 3.2 g/dL (ref 1.5–4.5)
Glucose: 191 mg/dL — ABNORMAL HIGH (ref 65–99)
Potassium: 3.9 mmol/L (ref 3.5–5.2)
Sodium: 144 mmol/L (ref 134–144)
Total Protein: 7.1 g/dL (ref 6.0–8.5)
eGFR: 94 mL/min/{1.73_m2} (ref >59)

## 2021-06-03 LAB — HEMOGLOBIN A1C
Est. average glucose Bld gHb Est-mCnc: 131 mg/dL
Hgb A1c MFr Bld: 6.2 % — ABNORMAL HIGH (ref 4.8–5.6)

## 2021-06-03 LAB — PSA: Prostate Specific Ag, Serum: 4.3 ng/mL — ABNORMAL HIGH (ref 0.0–4.0)

## 2021-06-05 ENCOUNTER — Telehealth: Payer: Self-pay

## 2021-06-05 NOTE — Telephone Encounter (Signed)
I highly recommend the referral for urology.  Please proceed forward with this.  Encourage patient to follow through on this we will still be his family doctor but this needs further looking into by urology

## 2021-06-05 NOTE — Telephone Encounter (Signed)
Spoke with both patient and niece, [name] R. Regarding lab results and recommendations, the patient stated he would want to wait on the referral at this time. [name] would like for patient to have the urology referral for further evaluation due to strong family hx of prostate cancer. Please advise.

## 2021-06-06 ENCOUNTER — Other Ambulatory Visit: Payer: Self-pay

## 2021-06-06 ENCOUNTER — Telehealth: Payer: Self-pay | Admitting: Family Medicine

## 2021-06-06 DIAGNOSIS — R972 Elevated prostate specific antigen [PSA]: Secondary | ICD-10-CM

## 2021-06-08 ENCOUNTER — Telehealth: Payer: Self-pay | Admitting: *Deleted

## 2021-06-08 NOTE — Telephone Encounter (Signed)
Legal guardian [name] called with concerns about patient. [name] states that patient needs someone that can come check on him in the home- possibly home health and some therapy. Patient has a Glass blower/designer and they came and saw him and he declined the Education officer, museum and pharmacy help. She states he will decline services he needs and say he is fine when he is not because he is afraid of losing independence and wont ask for help when he needs it. He also will not go to hospital for anything because he is afraid they wont let him out. [name] states they other night she could not reach patient all night and in the morning he said he fell out and has no recollection of anything from 7pm till the next morning- she feels like he had a stroke or heart attack. She stated she doesn't want to take his independence but is very concerned for him and feels he needs someone who can come in and check in on him regularly and give him strengthening therapy in the home- patient is currently unable to drive.  [name] [name]'s [phone]  See care coordinator note in EPIC

## 2021-06-09 ENCOUNTER — Ambulatory Visit (HOSPITAL_COMMUNITY)
Admission: RE | Admit: 2021-06-09 | Discharge: 2021-06-09 | Disposition: A | Discharge status: Home / Self Care | Payer: Medicare Other | Source: Ambulatory Visit | Attending: Family Medicine | Admitting: Family Medicine

## 2021-06-09 ENCOUNTER — Other Ambulatory Visit: Payer: Self-pay

## 2021-06-09 DIAGNOSIS — Z720 Tobacco use: Secondary | ICD-10-CM | POA: Diagnosis not present

## 2021-06-09 DIAGNOSIS — J449 Chronic obstructive pulmonary disease, unspecified: Secondary | ICD-10-CM | POA: Insufficient documentation

## 2021-06-09 DIAGNOSIS — J432 Centrilobular emphysema: Secondary | ICD-10-CM | POA: Diagnosis not present

## 2021-06-09 DIAGNOSIS — I898 Other specified noninfective disorders of lymphatic vessels and lymph nodes: Secondary | ICD-10-CM | POA: Diagnosis not present

## 2021-06-09 DIAGNOSIS — R911 Solitary pulmonary nodule: Secondary | ICD-10-CM | POA: Insufficient documentation

## 2021-06-09 DIAGNOSIS — I739 Peripheral vascular disease, unspecified: Secondary | ICD-10-CM | POA: Diagnosis not present

## 2021-06-09 DIAGNOSIS — I2510 Atherosclerotic heart disease of native coronary artery without angina pectoris: Secondary | ICD-10-CM | POA: Diagnosis not present

## 2021-06-09 DIAGNOSIS — I700 Atherosclerosis of aorta: Secondary | ICD-10-CM | POA: Diagnosis not present

## 2021-06-09 NOTE — Telephone Encounter (Signed)
First of all I am not sure that home health would necessarily see him or do therapy  Secondly if they feel that he has had some sort of a severe issues such as a stroke or heart attack they need to have him seen-if emergent then ER if nonemergent schedule follow-up office visit with [place]  Third-please forward the message from [place] to [place] worker [facility] CCM care manager to see if they have any input regarding what can be done.  This is more of a social service issue than anything else.  I am happy to help with additional referrals.  I am also happy to receive him.  But I am somewhat perplexed what steps can be done within the home to help address these issues.  Needless to say he has to be a willing partner and helping himself and allowing others to help him  Yaakov Guthrie sure that family member is aware that we are trying to work through the above steps and they are more than welcome to schedule him a follow-up visit as listed above

## 2021-06-09 NOTE — Telephone Encounter (Addendum)
Left message to return call - Yetta Barre  Forwarded message to [facility] social worker Almyra Free Farmer(currently following patient)

## 2021-06-12 NOTE — Telephone Encounter (Signed)
Pt guardian contacted and would like to set up a follow up appt. Raymond Walker states to call patient and let him know. Pt contacted and set up for 06/29/21 at 9:50 am (be here at 9:30). Pt guardian and patient verbalized understanding.

## 2021-06-12 NOTE — Telephone Encounter (Signed)
FYI

## 2021-06-13 ENCOUNTER — Other Ambulatory Visit: Payer: Self-pay | Admitting: Family Medicine

## 2021-06-13 DIAGNOSIS — R911 Solitary pulmonary nodule: Secondary | ICD-10-CM

## 2021-06-13 MED ORDER — AMOXICILLIN 500 MG PO CAPS: ORAL_CAPSULE | ORAL | 0 refills | Status: DC

## 2021-06-14 ENCOUNTER — Ambulatory Visit: Payer: Medicare Other | Admitting: *Deleted

## 2021-06-14 DIAGNOSIS — J449 Chronic obstructive pulmonary disease, unspecified: Secondary | ICD-10-CM

## 2021-06-14 DIAGNOSIS — I739 Peripheral vascular disease, unspecified: Secondary | ICD-10-CM

## 2021-06-14 NOTE — Chronic Care Management (AMB) (Signed)
Chronic Care Management   CCM RN Visit Note  06/14/2021 Name: Raymond Walker. MRN: [id number] DOB: 1951/02/15  Subjective: [name] is a 70 y.o. year old male who is a primary care patient of Luking, Elayne Snare, MD. The care management team was consulted for assistance with disease management and care coordination needs.    Engaged with patient by telephone for follow up in response to provider referral for case management and/or care coordination services.   Consent to Services:  The patient was given information about Chronic Care Management services, agreed to services, and gave verbal consent prior to initiation of services.  Please see initial visit note for detailed documentation.   Patient agreed to services and verbal consent obtained.   Assessment: Review of patient past medical history, allergies, medications, health status, including review of consultants reports, laboratory and other test data, was performed as part of comprehensive evaluation and provision of chronic care management services.   SDOH (Social Determinants of Health) assessments and interventions performed:    CCM Care Plan  No Known Allergies  Outpatient Encounter Medications as of 06/14/2021  Medication Sig Note   albuterol (VENTOLIN HFA) 108 (90 Base) MCG/ACT inhaler Inhale 2 puffs into the lungs every 4 (four) hours as needed for wheezing or shortness of breath.    amoxicillin (AMOXIL) 500 MG capsule Take one capsule po TID for 7 days    clopidogrel (PLAVIX) 75 MG tablet TAKE 1 TABLET(75 MG) BY MOUTH DAILY WITH BREAKFAST    pantoprazole (PROTONIX) 40 MG tablet TAKE 1 TABLET(40 MG) BY MOUTH DAILY    rosuvastatin (CRESTOR) 20 MG tablet TAKE 1 TABLET(20 MG) BY MOUTH DAILY    sildenafil (VIAGRA) 100 MG tablet Take 0.5-1 tablets (50-100 mg total) by mouth daily as needed for erectile dysfunction.    traMADol (ULTRAM) 50 MG tablet TAKE 1 TABLET(50 MG) BY MOUTH DAILY AS NEEDED FOR MODERATE PAIN  05/29/2021: prn   No facility-administered encounter medications on file as of 06/14/2021.    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[place])    Goals of care, counseling/discussion    Palliative care by specialist    DNR (do not resuscitate)    Vitamin D deficiency 01/24/2020   Sepsis ([place]) 01/23/2020   Altered mental status    Hypokalemia    History of kidney stones    Hematuria    UTI (urinary tract infection)    CHRONIC Bladder outlet obstruction    PAD (peripheral artery disease) ([place]) 09/15/2019   Protein-calorie malnutrition, severe 08/21/2019   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[place])    Cellulitis of right lower extremity    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[place]) 08/19/2019   Impaired glucose tolerance    COPD (chronic obstructive pulmonary disease) (HCC)    Thrombocytopenia ([place]) 08/10/2018   Aortic atherosclerosis ([place]) 10/14/2016   Coronary artery calcification seen on CAT scan 10/14/2016   Other emphysema ([place]) 10/14/2016   Elevated blood pressure 12/03/2011   Chest pain 12/03/2011   Tobacco abuse 12/03/2011   Right leg pain 12/03/2011    Conditions to be addressed/monitored:COPD, PAD  Care Plan : Peripheral Artery Disease (PAD)  Updates made by Kassie Mends, RN since 06/14/2021 12:00 AM     Problem: Health Promotion or Disease Self-Management for peripheral artery disease  Priority: High     Long-Range Goal: Self management plan developed for peripheral artery disease   Start Date: 05/24/2021  Expected End Date: 11/23/2021  This Visit's Progress: On track  Recent Progress: On track  Priority: High  Note:   Current Barriers:  Ineffective Self Health Maintenance - pt with history PAD, osteomyelitis of ankle and foot and partial amputation of right foot, pt has "special  shoes and inserts"  Pt does see podiatrist. Per niece, she feels pt may need another level of care, would like to discuss her options and speak with Education officer, museum. Does not adhere to provider recommendations re: smoking cessation, exercise. Lacks social connections- pt is not consistently involved socially with activities such as church, organizations/ clubs, friends. Does not contact provider office for questions/concerns- pt needs reminders to make sure he does not run out of medication and keeps timely refills (niece assists and will make sure pt has all medications)  Niece is HCPOA, pt does not have relationship with adult children. Clinical Goal(s):  Collaboration with [name], MD regarding development and update of comprehensive plan of care as evidenced by provider attestation and co-signature Inter-disciplinary care team collaboration (see longitudinal plan of care) patient will work with care management team to address care coordination and chronic disease management needs related to Disease Management Educational Needs Care Coordination Medication Management and Education   Interventions:  Evaluation of current treatment plan related to peripheral artery disease, Limited social support, Family and relationship dysfunction, and Literacy concerns self-management and patient's adherence to plan as established by provider. Collaboration with [name], MD regarding development and update of comprehensive plan of care as evidenced by provider attestation       and co-signature Inter-disciplinary care team collaboration (see longitudinal plan of care) Discussed plans with patient for ongoing care management follow up and provided patient with direct contact information for care management team Education sent via My Chart on Peripheral Artery Disease  Reviewed importance of smoking cessation Reviewed medications and importance of taking as prescribed Reviewed importance of taking  tramadol as needed for pain Reviewed services offered social work and pharmacy and declined at present RN CM contact number given Reviewed all upcoming doctor's appointments Order placed for social work to speak with niece regarding level of care issues, resources, options. Updated primary care provider. Self Care Activities:  Attends all scheduled provider appointments Calls pharmacy for medication refills Attends church or other social activities Calls provider office for new concerns or questions Patient Goals: - discuss my treatment options with the doctor or nurse and make shared treatment decisions - spend time outdoors at least 3 times a week - strengthen or fix relationships with loved ones  - consider therapy/ counseling - consider church or other organizations in your community to be a part of - smoking worsens peripheral artery disease - cut down on smoking or consider quitting - eat a healthy diet (lean meat, fresh or frozen vegetables if possible) limit junk food Follow Up Plan: Telephone follow up appointment with care management team member scheduled for:  06/21/2021      Plan: Telephone follow up appointment with care management team member scheduled for:  06/21/2021  Jacqlyn Larsen [facility], BSN RN Case Manager [place] [phone]

## 2021-06-14 NOTE — Patient Instructions (Signed)
Visit Information  PATIENT GOALS:  Goals Addressed             This Visit's Progress    Maintain My Quality of Life related to PAD       Timeframe:  Long-Range Goal Priority:  High Start Date:        05/24/2021                     Expected End Date:    11/23/2021                   Follow Up Date 06/21/2021   - discuss my treatment options with the doctor or nurse and make shared treatment      decisions - spend time outdoors at least 3 times a week - strengthen or fix relationships with loved ones  - consider therapy/ counseling - consider church or other organizations in your community to be a part of - smoking worsens peripheral artery disease - cut down on smoking or consider quitting - eat a healthy diet (lean meat, fresh or frozen vegetables if possible) limit junk food - Expect a call from social worker about level of care issues, resources    Why is this important?   Having a long-term illness can be scary.  It can also be stressful for you and your caregiver.  These steps may help.    Notes:          Patient verbalizes understanding of instructions provided today and agrees to view in [place].   Telephone follow up appointment with care management team member scheduled for:  06/21/2021  Jacqlyn Larsen [facility], BSN RN Case Manager [place] [phone]

## 2021-06-14 NOTE — Telephone Encounter (Signed)
FYI

## 2021-06-15 ENCOUNTER — Telehealth: Payer: Self-pay | Admitting: Family Medicine

## 2021-06-16 ENCOUNTER — Telehealth: Payer: Self-pay

## 2021-06-16 NOTE — Chronic Care Management (AMB) (Signed)
  Chronic Care Management   Note  06/16/2021 Name: Raymond Walker. MRN: [id number] DOB: 1951/03/27  [name]. is a 70 y.o. year old male who is a primary care patient of Luking, Elayne Snare, MD. [name]. is currently enrolled in care management services. An additional referral for LCSW was placed.   Follow up plan: Unsuccessful telephone outreach attempt made. A HIPAA compliant phone message was left for the patient providing contact information and requesting a return call.  The care management team will reach out to the patient again over the next 5 days.  If patient returns call to provider office, please advise to call [place]  at [place], [place], [place], [place] [address] Direct Dial: [phone] [name]: [email or web address]

## 2021-06-21 ENCOUNTER — Telehealth: Payer: Medicare Other

## 2021-06-21 ENCOUNTER — Telehealth: Payer: Self-pay | Admitting: *Deleted

## 2021-06-21 NOTE — Telephone Encounter (Signed)
  Care Management   Follow Up Note   06/21/2021 Name: [name] Vandevoort. MRN: [id number] DOB: 1951/07/20   Referred by: [name], MD Reason for referral : Chronic Care Management (COPD, PAD)   An unsuccessful telephone outreach was attempted today. The patient was referred to the case management team for assistance with care management and care coordination.   Follow Up Plan: Telephone follow up appointment with care management team member scheduled for:  upon care guide rescheduling, in basket sent.  Jacqlyn Larsen [facility], BSN RN Case Manager [facility] [phone]

## 2021-06-22 ENCOUNTER — Ambulatory Visit (INDEPENDENT_AMBULATORY_CARE_PROVIDER_SITE_OTHER): Payer: Medicare Other | Admitting: *Deleted

## 2021-06-22 DIAGNOSIS — L97509 Non-pressure chronic ulcer of other part of unspecified foot with unspecified severity: Secondary | ICD-10-CM | POA: Diagnosis not present

## 2021-06-22 DIAGNOSIS — E1169 Type 2 diabetes mellitus with other specified complication: Secondary | ICD-10-CM | POA: Diagnosis not present

## 2021-06-22 DIAGNOSIS — M869 Osteomyelitis, unspecified: Secondary | ICD-10-CM

## 2021-06-22 DIAGNOSIS — E11621 Type 2 diabetes mellitus with foot ulcer: Secondary | ICD-10-CM

## 2021-06-22 DIAGNOSIS — Z550 Illiteracy and low-level literacy: Secondary | ICD-10-CM

## 2021-06-22 DIAGNOSIS — J449 Chronic obstructive pulmonary disease, unspecified: Secondary | ICD-10-CM

## 2021-06-22 DIAGNOSIS — R5383 Other fatigue: Secondary | ICD-10-CM

## 2021-06-22 NOTE — Patient Instructions (Addendum)
Visit Information  PATIENT GOALS:  Goals Addressed             This Visit's Progress    Find Help in My Community       Current barriers:   Patient in need of assistance with connecting to community resources for Mental Health Concerns , Social Isolation, Limited access to caregiver, Cognitive Deficits, and Lacks knowledge of community resources Acknowledges deficits with meeting this unmet need Patient is unable to independently navigate community resource options without care coordination support Clinical Goals:  patient will work with SW to address concerns related to community resource/support   Clinical Interventions:  Collaboration with Wolfgang [place], Elayne Snare, MD regarding development and update of comprehensive plan of care as evidenced by provider attestation and co-signature Inter-disciplinary care team collaboration (see longitudinal plan of care) Assessment of needs, barriers , agencies contacted, as well as how impacting  Review various resources, discussed options and provided patient information about Literacy concerns, Cognitive Deficits, and Lacks knowledge of community resources  Collaborated with appropriate clinical care team members regarding patient needs Leodis [place] knowledge of community resources ,    Other interventions provided: Solution-Focused Strategies, Active listening / Reflection utilized , Problem Vivian , and Motivational Interviewing Patient Goals:  - review and consider PACE, Adult Day Care/Senior centers and other community resource options discussed and provided  Follow Up Plan: Appointment scheduled for SW follow up with client by phone on: 07/12/21          The patient verbalized understanding of instructions, educational materials, and care plan provided today and declined offer to receive copy of patient instructions, educational materials, and care plan.   Telephone follow up appointment with care management team member scheduled  for:07/12/21   Eduard Clos MSW, LCSW Licensed Clinical Social Worker [facility] [phone]

## 2021-06-22 NOTE — Chronic Care Management (AMB) (Signed)
Chronic Care Management    Clinical Social Work Note  06/22/2021 Name: Raymond Walker. MRN: [id number] DOB: August 01, 1951  Raymond Walker. is a 70 y.o. year old male who is a primary care patient of Luking, Elayne Snare, MD. The CCM team was consulted to assist the patient with chronic disease management and/or care coordination needs related to: Mental Health Counseling and Resources, Grief Counseling, and community resources/support .   Engaged with patient by telephone for initial visit in response to provider referral for social work chronic care management and care coordination services.   Consent to Services:  The patient was given information about Chronic Care Management services, agreed to services, and gave verbal consent prior to initiation of services.  Please see initial visit note for detailed documentation.   Patient agreed to services and consent obtained.   Assessment: Review of patient past medical history, allergies, medications, and health status, including review of relevant consultants reports was performed today as part of a comprehensive evaluation and provision of chronic care management and care coordination services.     SDOH (Social Determinants of Health) assessments and interventions performed:    Advanced Directives Status: See Care Plan for related entries.  CCM Care Plan  No Known Allergies  Outpatient Encounter Medications as of 06/22/2021  Medication Sig Note   albuterol (VENTOLIN HFA) 108 (90 Base) MCG/ACT inhaler Inhale 2 puffs into the lungs every 4 (four) hours as needed for wheezing or shortness of breath.    amoxicillin (AMOXIL) 500 MG capsule Take one capsule po TID for 7 days    clopidogrel (PLAVIX) 75 MG tablet TAKE 1 TABLET(75 MG) BY MOUTH DAILY WITH BREAKFAST    pantoprazole (PROTONIX) 40 MG tablet TAKE 1 TABLET(40 MG) BY MOUTH DAILY    rosuvastatin (CRESTOR) 20 MG tablet TAKE 1 TABLET(20 MG) BY MOUTH DAILY    sildenafil (VIAGRA) 100 MG tablet  Take 0.5-1 tablets (50-100 mg total) by mouth daily as needed for erectile dysfunction.    traMADol (ULTRAM) 50 MG tablet TAKE 1 TABLET(50 MG) BY MOUTH DAILY AS NEEDED FOR MODERATE PAIN 05/29/2021: prn   No facility-administered encounter medications on file as of 06/22/2021.    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[place])    Goals of care, counseling/discussion    Palliative care by specialist    DNR (do not resuscitate)    Vitamin D deficiency 01/24/2020   Sepsis ([place]) 01/23/2020   Altered mental status    Hypokalemia    History of kidney stones    Hematuria    UTI (urinary tract infection)    CHRONIC Bladder outlet obstruction    PAD (peripheral artery disease) ([place]) 09/15/2019   Protein-calorie malnutrition, severe 08/21/2019   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[place])    Cellulitis of right lower extremity    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[place]) 08/19/2019   Impaired glucose tolerance    COPD (chronic obstructive pulmonary disease) (HCC)    Thrombocytopenia ([place]) 08/10/2018   Aortic atherosclerosis ([place]) 10/14/2016   Coronary artery calcification seen on CAT scan 10/14/2016   Other emphysema ([place]) 10/14/2016   Elevated blood pressure 12/03/2011   Chest pain 12/03/2011   Tobacco abuse 12/03/2011   Right leg pain 12/03/2011    Conditions to be addressed/monitored: COPD; Mental Health Concerns  and  Lacks knowledge of community resources      Follow Up Plan: Appointment scheduled for SW follow up with client by phone on: 07/12/21      Eduard Clos MSW, [place] Licensed Clinical Social Worker [place] [phone]

## 2021-06-23 ENCOUNTER — Telehealth: Payer: Self-pay | Admitting: *Deleted

## 2021-06-23 NOTE — Telephone Encounter (Signed)
   Telephone encounter was:  Unsuccessful.  06/23/2021 Name: Raymond Walker. MRN: [id number] DOB: [birth year]  Unsuccessful outbound call made today to assist with:  Food Insecurity, Home Modifications, and Caregiver Stress  Outreach Attempt:  1st Attempt  A HIPAA compliant voice message was left requesting a return call.  Instructed patient to call back at   Instructed patient to call back at [phone]  at their earliest convenience. .  [place], Care Management  [phone] [address] , [place] [address] Email : [email or web address]

## 2021-06-26 ENCOUNTER — Telehealth: Payer: Self-pay | Admitting: *Deleted

## 2021-06-26 NOTE — Telephone Encounter (Signed)
   Telephone encounter was:  Unsuccessful.  06/26/2021 Name: Raymond Walker. MRN: [id number] DOB: 01/26/1951  Unsuccessful outbound call made today to assist with:   caregiver questions   Outreach Attempt:  2nd Attempt  A HIPAA compliant voice message was left requesting a return call.  Instructed patient to call back at   Instructed patient to call back at [phone]  at their earliest convenience.   [place], Care Management  [phone] [address] , [place] [address] Email : [email or web address]

## 2021-06-27 ENCOUNTER — Telehealth: Payer: Self-pay | Admitting: *Deleted

## 2021-06-27 NOTE — Telephone Encounter (Signed)
   Telephone encounter was:  Unsuccessful.  06/27/2021 Name: Raymond Walker. MRN: [id number] DOB: 01/27/1951  Unsuccessful outbound call made today to assist with:  Caregiver Stress  Outreach Attempt:  3rd Attempt.  Referral closed unable to contact patient.  A HIPAA compliant voice message was left requesting a return call.  Instructed patient to call back at   Instructed patient to call back at [phone]  at their earliest convenience.[place], Care Management  [phone] [address] , [place] [address] Email : [email or web address]

## 2021-06-28 ENCOUNTER — Telehealth: Payer: Medicare Other

## 2021-06-28 ENCOUNTER — Telehealth: Payer: Self-pay | Admitting: *Deleted

## 2021-06-28 NOTE — Telephone Encounter (Signed)
  Care Management   Follow Up Note   06/28/2021 Name: Ekansh Sherk. MRN: [id number] DOB: Oct 24, 1951   Referred by: [name], MD Reason for referral : Chronic Care Management (PAD, COPD)   A second unsuccessful telephone outreach was attempted today. The patient was referred to the case management team for assistance with care management and care coordination.   Follow Up Plan: Telephone follow up appointment with care management team member scheduled for:  upon care guide rescheduling, in basket sent.  Jacqlyn Larsen [facility], BSN RN Case Manager [facility] [phone]

## 2021-06-29 ENCOUNTER — Encounter: Payer: Self-pay | Admitting: Family Medicine

## 2021-06-29 ENCOUNTER — Telehealth: Payer: Self-pay | Admitting: *Deleted

## 2021-06-29 ENCOUNTER — Ambulatory Visit: Payer: Self-pay | Admitting: Family Medicine

## 2021-06-29 NOTE — Telephone Encounter (Signed)
   Telephone encounter was:  Unsuccessful.  06/29/2021 Name: Raymond Walker. MRN: [id number] DOB: 24-May-1951  Unsuccessful outbound call made today to assist with:  have left 3 messages for HCPOA and no response so going to gather the information requested and mail to  [facility]  address   Outreach Attempt:  3rd Attempt.  Referral closed unable to contact patient.  A HIPAA compliant voice message was left requesting a return call.  Instructed patient to call back at   Instructed patient to call back at [phone]  at their earliest convenience. .  [place], Care Management  [phone] [address] , [place] [address] Email : [email or web address]

## 2021-07-05 ENCOUNTER — Ambulatory Visit: Payer: Medicare Other | Admitting: Urology

## 2021-07-05 DIAGNOSIS — R972 Elevated prostate specific antigen [PSA]: Secondary | ICD-10-CM

## 2021-07-06 ENCOUNTER — Ambulatory Visit (HOSPITAL_COMMUNITY): Payer: Medicare Other

## 2021-07-12 ENCOUNTER — Telehealth: Payer: Medicare Other

## 2021-07-20 ENCOUNTER — Telehealth: Payer: Self-pay | Admitting: *Deleted

## 2021-07-20 NOTE — Chronic Care Management (AMB) (Signed)
  Care Management   Note  07/20/2021 Name: Raymond Walker. MRN: [id number] DOB: 29-Mar-1951  Raymond Kerns [name]. is a 70 y.o. year old male who is a primary care patient of Luking, Elayne Snare, MD and is actively engaged with the care management team. I reached out to [name] by phone today to assist with re-scheduling an initial visit with the RN Case Manager  Follow up plan: Unsuccessful telephone outreach attempt made. A HIPAA compliant phone message was left for the patient providing contact information and requesting a return call. The care management team will reach out to the patient again over the next 7-14 days. If patient returns call to provider office, please advise to call [place]  at [phone].  [place] Management  Direct Dial: [phone]

## 2021-07-22 ENCOUNTER — Other Ambulatory Visit: Payer: Self-pay | Admitting: Family Medicine

## 2021-07-26 ENCOUNTER — Ambulatory Visit (HOSPITAL_COMMUNITY)
Admission: RE | Admit: 2021-07-26 | Discharge: 2021-07-26 | Disposition: A | Discharge status: Home / Self Care | Payer: Medicare Other | Source: Ambulatory Visit | Attending: Family Medicine | Admitting: Family Medicine

## 2021-07-26 ENCOUNTER — Ambulatory Visit (INDEPENDENT_AMBULATORY_CARE_PROVIDER_SITE_OTHER): Payer: Medicare Other | Admitting: Family Medicine

## 2021-07-26 ENCOUNTER — Other Ambulatory Visit: Payer: Self-pay | Admitting: Family Medicine

## 2021-07-26 ENCOUNTER — Other Ambulatory Visit: Payer: Self-pay

## 2021-07-26 ENCOUNTER — Encounter: Payer: Self-pay | Admitting: Family Medicine

## 2021-07-26 ENCOUNTER — Other Ambulatory Visit (HOSPITAL_COMMUNITY)
Admission: RE | Admit: 2021-07-26 | Discharge: 2021-07-26 | Disposition: A | Discharge status: Home / Self Care | Payer: Medicare Other | Source: Ambulatory Visit | Attending: Family Medicine | Admitting: Family Medicine

## 2021-07-26 VITALS — BP 112/60 | HR 55 | Temp 97.9°F | Wt 143.6 lb

## 2021-07-26 DIAGNOSIS — R0609 Other forms of dyspnea: Secondary | ICD-10-CM

## 2021-07-26 DIAGNOSIS — R001 Bradycardia, unspecified: Secondary | ICD-10-CM | POA: Insufficient documentation

## 2021-07-26 DIAGNOSIS — R0602 Shortness of breath: Secondary | ICD-10-CM | POA: Diagnosis not present

## 2021-07-26 DIAGNOSIS — J441 Chronic obstructive pulmonary disease with (acute) exacerbation: Secondary | ICD-10-CM | POA: Diagnosis not present

## 2021-07-26 DIAGNOSIS — R0600 Dyspnea, unspecified: Secondary | ICD-10-CM | POA: Diagnosis not present

## 2021-07-26 DIAGNOSIS — Z9189 Other specified personal risk factors, not elsewhere classified: Secondary | ICD-10-CM

## 2021-07-26 DIAGNOSIS — R059 Cough, unspecified: Secondary | ICD-10-CM | POA: Diagnosis not present

## 2021-07-26 DIAGNOSIS — J449 Chronic obstructive pulmonary disease, unspecified: Secondary | ICD-10-CM

## 2021-07-26 LAB — CBC WITH DIFFERENTIAL/PLATELET
Abs Immature Granulocytes: 0.02 10*3/uL (ref 0.00–0.07)
Basophils Absolute: 0 10*3/uL (ref 0.0–0.1)
Basophils Relative: 1 %
Eosinophils Absolute: 0.1 10*3/uL (ref 0.0–0.5)
Eosinophils Relative: 1 %
HCT: 47.5 % (ref 39.0–52.0)
Hemoglobin: 15.6 g/dL (ref 13.0–17.0)
Immature Granulocytes: 0 %
Lymphocytes Relative: 25 %
Lymphs Abs: 1.4 10*3/uL (ref 0.7–4.0)
MCH: 28.8 pg (ref 26.0–34.0)
MCHC: 32.8 g/dL (ref 30.0–36.0)
MCV: 87.6 fL (ref 80.0–100.0)
Monocytes Absolute: 0.8 10*3/uL (ref 0.1–1.0)
Monocytes Relative: 13 %
Neutro Abs: 3.5 10*3/uL (ref 1.7–7.7)
Neutrophils Relative %: 60 %
Platelets: 130 10*3/uL — ABNORMAL LOW (ref 150–400)
RBC: 5.42 MIL/uL (ref 4.22–5.81)
RDW: 13.7 % (ref 11.5–15.5)
WBC: 5.8 10*3/uL (ref 4.0–10.5)
nRBC: 0 % (ref 0.0–0.2)

## 2021-07-26 LAB — COMPREHENSIVE METABOLIC PANEL
ALT: 50 U/L — ABNORMAL HIGH (ref 0–44)
AST: 36 U/L (ref 15–41)
Albumin: 3.6 g/dL (ref 3.5–5.0)
Alkaline Phosphatase: 92 U/L (ref 38–126)
Anion gap: 4 — ABNORMAL LOW (ref 5–15)
BUN: 16 mg/dL (ref 8–23)
CO2: 31 mmol/L (ref 22–32)
Calcium: 8.7 mg/dL — ABNORMAL LOW (ref 8.9–10.3)
Chloride: 102 mmol/L (ref 98–111)
Creatinine, Ser: 0.91 mg/dL (ref 0.61–1.24)
GFR, Estimated: >60 mL/min (ref >60)
Glucose, Bld: 112 mg/dL — ABNORMAL HIGH (ref 70–99)
Potassium: 3.8 mmol/L (ref 3.5–5.1)
Sodium: 137 mmol/L (ref 135–145)
Total Bilirubin: 1.1 mg/dL (ref 0.3–1.2)
Total Protein: 8 g/dL (ref 6.5–8.1)

## 2021-07-26 MED ORDER — AMOXICILLIN-POT CLAVULANATE 875-125 MG PO TABS: ORAL_TABLET | ORAL | 0 refills | Status: DC

## 2021-07-26 MED ORDER — ROSUVASTATIN CALCIUM 20 MG PO TABS: ORAL_TABLET | ORAL | 3 refills | Status: DC

## 2021-07-26 MED ORDER — PANTOPRAZOLE SODIUM 40 MG PO TBEC: DELAYED_RELEASE_TABLET | ORAL | 4 refills | Status: DC

## 2021-07-26 MED ORDER — ALBUTEROL SULFATE HFA 108 (90 BASE) MCG/ACT IN AERS: 2.0000 | INHALATION_SPRAY | RESPIRATORY_TRACT | 6 refills | Status: DC | PRN

## 2021-07-26 NOTE — Progress Notes (Signed)
   Subjective:    Patient ID: Raymond Radon., male    DOB: 1951-08-19, 70 y.o.   MRN: [id number]  HPI Pt here for follow up. Pt states he has ran out of med and been out for over one week. Pharmacy told patient that he had to see the doctor before any refills. Pt states since being out of medication pt has been drained and had harder time breathing.   Very complex individual.  Having difficult time with shortness of breath with activity.  Has ran out of his medicines recently.  Denies high fever chills but relates a lot of chest congestion and coughing.  Denies any chest tightness pressure pain  Not eating well has lost 12 pounds over the past month and a half  Does not have reliable family to look in on him.  Review of Systems     Objective:   Physical Exam  Bilateral congestion noted but not in respiratory distress O2 sat 92% on room air with activity drops to 88 Heart irregular extremities no edema      Assessment & Plan:  We will go ahead and do stat lab work and stat x-ray If he gets worse I recommend that he go to the emergency department Warning signs were discussed Will also contact medical social worker to try to help assist patient Await the findings of the lab work May need to go to ER depending normal x-ray and labs show Go ahead with Augmentin 875 twice daily for probable COPD exacerbation Hold off on prednisone until we see lab work

## 2021-07-27 ENCOUNTER — Telehealth: Payer: Self-pay | Admitting: Family Medicine

## 2021-07-27 MED ORDER — SUCRALFATE 1 G PO TABS: ORAL_TABLET | ORAL | 0 refills | Status: DC

## 2021-07-27 NOTE — Telephone Encounter (Signed)
[place] emergency contact left vm stating that the wrong medication was called in yesterday to [facility]. She states that they won't refund her the cash she paid and said something about switching pharmacy's as well.   CB# [phone]

## 2021-07-27 NOTE — Telephone Encounter (Signed)
May call in refill of soaker fate.  Sucralfate is a as needed medicine that may be used up to 4 times a day if he is having stomach gastritis symptoms.  He does not have to be on that year-round  As for the Crestor I was under the impression he was not taking it currently because of how he was feeling in the long run he needs to be on this medicine to lessen his risk of heart attack and stroke.  Now obviously if he absolutely refuses to ever take it again we can remove it from his med list otherwise once he is feeling better I would hope that he would be willing to restart that medicine.  Out of curiosity what was the cost of this medicine with his insurance? In the future we might be able to get social services with [facility] to potentially help with the cost of medicine

## 2021-07-27 NOTE — Telephone Encounter (Signed)
[place] called and is upset the wrong med was sent in for the patient- States the patient does not take crestor anymore and the doctor said that was fine but a script was sent in and the patient spent all the money he had on the med he doesn't take and they wont take them back.  [place] states the patient needed refill of sulfate 1 gm (not on med list-states d/c at 05/29/21 visit) that he has to take to be able to eat and needs that refill.   Please advise   [facility] scales

## 2021-07-27 NOTE — Telephone Encounter (Signed)
Prescription sent electronically to pharmacy.  Patient advised per Dr Nicki Reaper: May call in refill of Carafate.  Carafate(Sucralfate) is a as needed medicine that may be used up to 4 times a day if he is having stomach gastritis symptoms.  He does not have to be on that year-round   As for the Crestor Dr Nicki Reaper was under the impression he was not taking it currently because of how he was feeling in the long run he needs to be on this medicine to lessen his risk of heart attack and stroke.  Now obviously if he absolutely refuses to ever take it again we can remove it from his med list otherwise once he is feeling better Dr Nicki Reaper would hope that he would be willing to restart that medicine.  Out of curiosity what was the cost of this medicine with his insurance? $25 In the future we might be able to get social services with [facility] to potentially help with the cost of this  Patient verbalized understanding

## 2021-07-27 NOTE — Telephone Encounter (Signed)
Prescription sent electronically to pharmacy  Left message to return call 

## 2021-08-02 ENCOUNTER — Other Ambulatory Visit: Payer: Self-pay | Admitting: Family Medicine

## 2021-08-02 NOTE — Progress Notes (Signed)
Error

## 2021-08-02 NOTE — Chronic Care Management (AMB) (Signed)
  Care Management   Note  08/02/2021 Name: Raymond Walker. MRN: [id number] DOB: 1951/05/02  Raymond Kerns Chidiebere Roa. is a 70 y.o. year old male who is a primary care patient of Luking, Elayne Snare, MD and is actively engaged with the care management team. I reached out to Raymond Walker. by phone today to assist with re-scheduling a follow up visit with the RN Case Manager  Follow up plan: Telephone appointment with care management team member scheduled for:08/09/21  Raymond Walker  Care Guide, [place] [facility]  Direct Dial: [phone]

## 2021-08-03 ENCOUNTER — Other Ambulatory Visit: Payer: Self-pay

## 2021-08-03 ENCOUNTER — Ambulatory Visit (INDEPENDENT_AMBULATORY_CARE_PROVIDER_SITE_OTHER): Payer: Medicare Other | Admitting: Family Medicine

## 2021-08-03 VITALS — BP 137/83 | HR 89 | Wt 141.6 lb

## 2021-08-03 DIAGNOSIS — R911 Solitary pulmonary nodule: Secondary | ICD-10-CM | POA: Diagnosis not present

## 2021-08-03 DIAGNOSIS — J449 Chronic obstructive pulmonary disease, unspecified: Secondary | ICD-10-CM

## 2021-08-03 MED ORDER — TRAMADOL HCL 50 MG PO TABS: ORAL_TABLET | ORAL | 0 refills | Status: DC

## 2021-08-03 NOTE — Patient Instructions (Addendum)
We will do the CT scan at the end of Sept As I wrote on the paper Sept Weds 28th at 11 am We will see you the first part of OCT here to discuss the test

## 2021-08-03 NOTE — Progress Notes (Signed)
   Subjective:    Patient ID: Raymond Walker., male    DOB: 1951/11/29, 70 y.o.   MRN: [id number]  HPI  Patient arrives for a follow up. Patient states he is feeling 100% better on his meds and able to eat good and got down to one pack of cigarettes a week. Needs refill of tramadol  Patient is doing remarkably better.  Eating better breathing better is cut back on his smoking to just 1 pack a day and hopes to quit  Uses tramadol sparingly for leg pain that he gets at nighttime that keeps him awake he will not use it every day Review of Systems     Objective:   Physical Exam   General-in no acute distress Eyes-no discharge Lungs-respiratory rate normal, CTA CV-no murmurs,RRR Extremities skin warm dry no edema Neuro grossly normal Behavior normal, alert      Assessment & Plan:  COPD severe Patient doing a good job of cutting back on smoking which is helpful He is due for follow-up CAT scan at the end of September we will do a follow-up office visit 1 week later to discuss results  Appetite is improving we will monitor weight

## 2021-08-09 ENCOUNTER — Telehealth: Payer: Self-pay | Admitting: *Deleted

## 2021-08-09 ENCOUNTER — Other Ambulatory Visit: Payer: Self-pay | Admitting: Family Medicine

## 2021-08-09 ENCOUNTER — Telehealth: Payer: Medicare Other

## 2021-08-09 NOTE — Telephone Encounter (Signed)
Telephone call to patient/ niece [name] (3rd attempt) with no answer to telephone and no option to leave voicemail.  Case closed due to unable to reach.  Jacqlyn Larsen [facility], BSN RN Case Manager [facility] [phone]

## 2021-08-15 ENCOUNTER — Ambulatory Visit: Payer: Self-pay | Admitting: *Deleted

## 2021-08-15 DIAGNOSIS — J441 Chronic obstructive pulmonary disease with (acute) exacerbation: Secondary | ICD-10-CM

## 2021-08-15 DIAGNOSIS — I739 Peripheral vascular disease, unspecified: Secondary | ICD-10-CM

## 2021-08-15 NOTE — Chronic Care Management (AMB) (Signed)
   08/15/2021  Raymond Walker. 06-Mar-1951 [id number]   Case closed due to unable to reach.    Jacqlyn Larsen [facility], BSN RN Case Manager [facility] [phone]

## 2021-09-05 ENCOUNTER — Ambulatory Visit (INDEPENDENT_AMBULATORY_CARE_PROVIDER_SITE_OTHER): Payer: Medicare Other | Admitting: *Deleted

## 2021-09-05 DIAGNOSIS — I739 Peripheral vascular disease, unspecified: Secondary | ICD-10-CM

## 2021-09-05 DIAGNOSIS — R5383 Other fatigue: Secondary | ICD-10-CM

## 2021-09-05 DIAGNOSIS — J441 Chronic obstructive pulmonary disease with (acute) exacerbation: Secondary | ICD-10-CM

## 2021-09-05 DIAGNOSIS — L97509 Non-pressure chronic ulcer of other part of unspecified foot with unspecified severity: Secondary | ICD-10-CM

## 2021-09-05 NOTE — Patient Instructions (Signed)
Visit Information  PATIENT GOALS:  Goals Addressed             This Visit's Progress    COMPLETED: Find Help in My Community       Current barriers:   Patient in need of assistance with connecting to community resources for Mental Health Concerns , Social Isolation, Limited access to caregiver, Cognitive Deficits, and Lacks knowledge of community resources Acknowledges deficits with meeting this unmet need Patient is unable to independently navigate community resource options without care coordination support Clinical Goals:  patient will work with SW to address concerns related to community resource/support   Clinical Interventions:  Collaboration with Wolfgang [place], Elayne Snare, MD regarding development and update of comprehensive plan of care as evidenced by provider attestation and co-signature Inter-disciplinary care team collaboration (see longitudinal plan of care) Assessment of needs, barriers , agencies contacted, as well as how impacting  Review various resources, discussed options and provided patient information about Literacy concerns, Cognitive Deficits, and Lacks knowledge of community resources  Collaborated with appropriate clinical care team members regarding patient needs Leodis Liverpool knowledge of community resources ,    Other interventions provided: Solution-Focused Strategies, Active listening / Reflection utilized , Problem [place] , and Motivational Interviewing Patient Goals:  - review and consider PACE, Adult Day Care/Senior centers and other community resource options discussed and provided  Follow Up Plan: No further follow up planned- please call or ask PCP to re-consult if needs/interest change         The patient verbalized understanding of instructions, educational materials, and care plan provided today and declined offer to receive copy of patient instructions, educational materials, and care plan.   The patient will call CSW * as advised to if needs  arise.  No further follow up required:    Eduard Clos MSW, LCSW Licensed Clinical Social Worker [place] [phone]

## 2021-09-05 NOTE — Chronic Care Management (AMB) (Signed)
Chronic Care Management    Clinical Social Work Note  09/05/2021 Name: Raymond Walker. MRN: [id number] DOB: March 17, 1951  Raymond Walker. is a 70 y.o. year old male who is a primary care patient of Luking, Elayne Snare, MD. The CCM team was consulted to assist the patient with chronic disease management and/or care coordination needs related to: [employer] .   Engaged with patient by telephone for follow up visit in response to provider referral for social work chronic care management and care coordination services.   Consent to Services:  The patient was given information about Chronic Care Management services, agreed to services, and gave verbal consent prior to initiation of services.  Please see initial visit note for detailed documentation.   Patient agreed to services and consent obtained.   Assessment: Review of patient past medical history, allergies, medications, and health status, including review of relevant consultants reports was performed today as part of a comprehensive evaluation and provision of chronic care management and care coordination services.     SDOH (Social Determinants of Health) assessments and interventions performed:    Advanced Directives Status: See Care Plan for related entries.  CCM Care Plan  No Known Allergies  Outpatient Encounter Medications as of 09/05/2021  Medication Sig   albuterol (VENTOLIN HFA) 108 (90 Base) MCG/ACT inhaler INHALE 2 PUFFS INTO THE LUNGS EVERY 4 HOURS AS NEEDED FOR WHEEZING OR SHORTNESS OF BREATH   amoxicillin-clavulanate (AUGMENTIN) 875-125 MG tablet Take one tablet po twice daily for 10 days   clopidogrel (PLAVIX) 75 MG tablet TAKE 1 TABLET(75 MG) BY MOUTH DAILY WITH BREAKFAST   pantoprazole (PROTONIX) 40 MG tablet TAKE 1 TABLET(40 MG) BY MOUTH DAILY   rosuvastatin (CRESTOR) 20 MG tablet TAKE 1 TABLET(20 MG) BY MOUTH DAILY   sildenafil (VIAGRA) 100 MG tablet Take 0.5-1 tablets (50-100 mg total) by mouth daily as  needed for erectile dysfunction.   sucralfate (CARAFATE) 1 g tablet TAKE 1 TABLET(1 GRAM) BY MOUTH FOUR TIMES DAILY AT BEDTIME WITH MEALS AS NEEDED FOR GASTRITIS   traMADol (ULTRAM) 50 MG tablet TAKE 1 TABLET(50 MG) BY MOUTH DAILY AS NEEDED FOR MODERATE PAIN   No facility-administered encounter medications on file as of 09/05/2021.    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[place])    Goals of care, counseling/discussion    Palliative care by specialist    DNR (do not resuscitate)    Vitamin D deficiency 01/24/2020   Sepsis ([place]) 01/23/2020   Altered mental status    Hypokalemia    History of kidney stones    Hematuria    UTI (urinary tract infection)    CHRONIC Bladder outlet obstruction    PAD (peripheral artery disease) ([place]) 09/15/2019   Protein-calorie malnutrition, severe 08/21/2019   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[place])    Cellulitis of right lower extremity    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[place]) 08/19/2019   Impaired glucose tolerance    COPD (chronic obstructive pulmonary disease) (HCC)    Thrombocytopenia ([place]) 08/10/2018   Aortic atherosclerosis ([place]) 10/14/2016   Coronary artery calcification seen on CAT scan 10/14/2016   Other emphysema ([place]) 10/14/2016   Elevated blood pressure 12/03/2011   Chest pain 12/03/2011   Tobacco abuse 12/03/2011   Right leg pain 12/03/2011    Conditions to  be addressed/monitored: Depression; Mental Health Concerns  and Lacks knowledge of community resource:    Care Plan : LCSW Plan of Care  Updates made by Deirdre Peer, LCSW since 09/05/2021 12:00 AM     Problem: Social and Functional Symptoms   Priority: High     Long-Range Goal: Provide resources, support and opportunites to aide in pt's safetty and quality of life Completed 09/05/2021   Start Date: 06/22/2021  Expected End Date: 08/16/2021  This Visit's Progress: On track  Recent Progress: On track  Priority: High  Note:   Current barriers:   Patient in need of assistance with connecting to community resources for Mental Health Concerns , Literacy concerns, Limited access to caregiver, and Lacks knowledge of community resources Acknowledges deficits with meeting this unmet need Patient is unable to independently navigate community resource option  without care coordination support Clinical Goals:  patient will work with SW to address concerns related to community resource/support  Clinical Interventions:   CSW spoke with pt's niece/HCPOA, [name], who reports things have been going very well. CSW also contacted pt to discuss interest and needs to which he declines. "I have always kept to myself and don't like being around a bunch of people".   Pt has been going to local food bank and also to [employer] to get food assistance. Pt's niece has the material provided for consideration and will outreach CSW if pt's interest changes.   Pt declines counseling support as well; states he is handling things ok.  Collaboration with [name], MD regarding development and update of comprehensive plan of care as evidenced by provider attestation and co-signature Inter-disciplinary care team collaboration (see longitudinal plan of care) Assessment of needs, barriers , agencies contacted, as well as how impacting  Review various resources, discussed options and provided patient information about Literacy concerns, Limited access to caregiver, Cognitive Deficits, and Lacks knowledge of community resources Collaborated with appropriate clinical care team members regarding patient needs Literacy concerns, Cognitive Deficits, and Lacks knowledge of community resources, COPD Patient interviewed and appropriate assessments performed Referred patient to community resources care guide team  for assistance with PACE, Adult Day Care/senior center options in [place] Provided mental health counseling with regard to grief counseling/other  Other interventions provided: Solution-Focused Strategies, Active listening / Reflection utilized , Emotional Supportive Provided, Psychoeducation for mental health needs , Caregiver stress acknowledged , Participation in counseling encouraged , and Increase in actives / exercise encouraged  Patient Goals:  - review and consider PACE, Adult Day Care/Senior centers and other community resource options discussed and provided   Follow Up Plan: No follow up planned       Follow Up Plan: Client will contact CSW if needs or interest change      Eduard Clos MSW, LCSW Licensed Clinical Social Worker [place] [phone]

## 2021-09-13 ENCOUNTER — Other Ambulatory Visit: Payer: Self-pay

## 2021-09-13 ENCOUNTER — Ambulatory Visit (HOSPITAL_COMMUNITY)
Admission: RE | Admit: 2021-09-13 | Discharge: 2021-09-13 | Disposition: A | Discharge status: Home / Self Care | Payer: Medicare Other | Source: Ambulatory Visit | Attending: Family Medicine | Admitting: Family Medicine

## 2021-09-13 DIAGNOSIS — R911 Solitary pulmonary nodule: Secondary | ICD-10-CM | POA: Diagnosis not present

## 2021-09-13 DIAGNOSIS — J439 Emphysema, unspecified: Secondary | ICD-10-CM | POA: Diagnosis not present

## 2021-09-13 DIAGNOSIS — I700 Atherosclerosis of aorta: Secondary | ICD-10-CM | POA: Diagnosis not present

## 2021-09-15 DIAGNOSIS — J441 Chronic obstructive pulmonary disease with (acute) exacerbation: Secondary | ICD-10-CM | POA: Diagnosis not present

## 2021-09-15 DIAGNOSIS — E1169 Type 2 diabetes mellitus with other specified complication: Secondary | ICD-10-CM

## 2021-09-15 DIAGNOSIS — L97509 Non-pressure chronic ulcer of other part of unspecified foot with unspecified severity: Secondary | ICD-10-CM

## 2021-09-15 DIAGNOSIS — M869 Osteomyelitis, unspecified: Secondary | ICD-10-CM | POA: Diagnosis not present

## 2021-09-15 DIAGNOSIS — E11621 Type 2 diabetes mellitus with foot ulcer: Secondary | ICD-10-CM | POA: Diagnosis not present

## 2021-09-19 ENCOUNTER — Ambulatory Visit (INDEPENDENT_AMBULATORY_CARE_PROVIDER_SITE_OTHER): Payer: Medicare Other

## 2021-09-19 ENCOUNTER — Other Ambulatory Visit: Payer: Self-pay | Admitting: Family Medicine

## 2021-09-19 ENCOUNTER — Other Ambulatory Visit: Payer: Self-pay

## 2021-09-19 VITALS — BP 118/68 | HR 86 | Temp 98.7°F | Ht 72.0 in | Wt 152.2 lb

## 2021-09-19 DIAGNOSIS — Z0000 Encounter for general adult medical examination without abnormal findings: Secondary | ICD-10-CM

## 2021-09-19 DIAGNOSIS — Z1211 Encounter for screening for malignant neoplasm of colon: Secondary | ICD-10-CM

## 2021-09-19 DIAGNOSIS — M545 Low back pain, unspecified: Secondary | ICD-10-CM | POA: Insufficient documentation

## 2021-09-19 NOTE — Progress Notes (Signed)
Subjective:   Raymond Walker. is a 70 y.o. male who presents for Medicare Annual/Subsequent preventive examination.  Review of Systems     Cardiac Risk Factors include: advanced age (>82men, >2 women);dyslipidemia;male gender     Objective:    Today's Vitals   09/19/21 1018  BP: 118/68  Pulse: 86  Temp: 98.7 F (37.1 C)  SpO2: 100%  Weight: 152 lb 3.2 oz (69 kg)  Height: 6' (1.829 m)   Body mass index is 20.64 kg/m.  Advanced Directives 09/19/2021 05/24/2021 10/12/2020 08/24/2020 05/24/2020 03/17/2020 01/26/2020  Does Patient Have a Medical Advance Directive? Yes Yes Yes No Yes Yes No  Type of Paramedic of [place];Living will [place];Living will;Out of facility DNR (pink MOST or yellow form) [place];Living will;Out of facility DNR (pink MOST or yellow form) - [place];Living will;Out of facility DNR (pink MOST or yellow form) [place] -  Does patient want to make changes to medical advance directive? - No - Patient declined No - Guardian declined - No - Guardian declined No - Patient declined -  Copy of [place] in Chart? Yes - validated most recent copy scanned in chart (See row information) No - copy requested No - copy requested - No - copy requested - -  Would patient like information on creating a medical advance directive? - - - No - Patient declined - - -  Pre-existing out of facility DNR order (yellow form or pink MOST form) - - - - Pink MOST/Yellow Form most recent copy in chart - Physician notified to receive inpatient order - -  Some encounter information is confidential and restricted. Go to Review Flowsheets activity to see all data.    Current Medications (verified) Outpatient Encounter Medications as of 09/19/2021  Medication Sig   albuterol (VENTOLIN HFA) 108 (90 Base) MCG/ACT inhaler INHALE 2 PUFFS INTO THE LUNGS EVERY 4 HOURS AS NEEDED FOR  WHEEZING OR SHORTNESS OF BREATH   pantoprazole (PROTONIX) 40 MG tablet TAKE 1 TABLET(40 MG) BY MOUTH DAILY   rosuvastatin (CRESTOR) 20 MG tablet TAKE 1 TABLET(20 MG) BY MOUTH DAILY   sildenafil (VIAGRA) 100 MG tablet Take 0.5-1 tablets (50-100 mg total) by mouth daily as needed for erectile dysfunction.   traMADol (ULTRAM) 50 MG tablet TAKE 1 TABLET(50 MG) BY MOUTH DAILY AS NEEDED FOR MODERATE PAIN   amoxicillin-clavulanate (AUGMENTIN) 875-125 MG tablet Take one tablet po twice daily for 10 days (Patient not taking: Reported on 09/19/2021)   clopidogrel (PLAVIX) 75 MG tablet TAKE 1 TABLET(75 MG) BY MOUTH DAILY WITH BREAKFAST (Patient not taking: Reported on 09/19/2021)   sucralfate (CARAFATE) 1 g tablet TAKE 1 TABLET(1 GRAM) BY MOUTH FOUR TIMES DAILY AT BEDTIME WITH MEALS AS NEEDED FOR GASTRITIS (Patient not taking: Reported on 09/19/2021)   No facility-administered encounter medications on file as of 09/19/2021.    Allergies (verified) Patient has no known allergies.   History: Past Medical History:  Diagnosis Date   Caregiver with fatigue 05/02/2020   COPD (chronic obstructive pulmonary disease) (HCC)    Deformity    equinus and metatarsal   Erectile dysfunction    Erectile dysfunction 05/02/2020   Full dentures    History of kidney stones    History of seizures 05/31/2020   Between ages 77 and 55   Hypertension    Impaired glucose tolerance    Pancreatitis, recurrent    Pneumonia  Thrombocytopenia ([place]) 08/10/2018   Staying in the low 100s will follow closely   Past Surgical History:  Procedure Laterality Date   ABDOMINAL AORTOGRAM W/LOWER EXTREMITY Bilateral 08/20/2019   Procedure: ABDOMINAL AORTOGRAM W/LOWER EXTREMITY;  Surgeon: Marty Heck, MD;  Location: [facility];  Service: Cardiovascular;  Laterality: Bilateral;   ACHILLES TENDON SURGERY Right 08/26/2020   Procedure: ACHILLES TENDON LENGTHENING;  Surgeon: Criselda Peaches, DPM;  Location: [facility];  Service:  Podiatry;  Laterality: Right;   AMPUTATION Right 09/15/2019   Procedure: AMPUTATION RIGHT TOES One, Two, And Three;  Surgeon: Waynetta Sandy, MD;  Location: [facility];  Service: Vascular;  Laterality: Right;   APPENDECTOMY     CATARACT EXTRACTION W/PHACO Right 05/02/2018   Procedure: CATARACT EXTRACTION PHACO AND INTRAOCULAR LENS PLACEMENT ([place]);  Surgeon: Baruch Goldmann, MD;  Location: [facility];  Service: Ophthalmology;  Laterality: Right;  CDE: 11.11   CATARACT EXTRACTION W/PHACO Left 07/04/2018   Procedure: CATARACT EXTRACTION PHACO AND INTRAOCULAR LENS PLACEMENT ([facility]);  Surgeon: Baruch Goldmann, MD;  Location: [facility];  Service: Ophthalmology;  Laterality: Left;  CDE: 7.15   CHOLECYSTECTOMY     FEMORAL-POPLITEAL BYPASS GRAFT Right 09/15/2019   Procedure: Right BYPASS GRAFT FEMORAL to Above Knee POPLITEAL ARTERY;  Surgeon: Waynetta Sandy, MD;  Location: [facility];  Service: Vascular;  Laterality: Right;   FEMORAL-POPLITEAL BYPASS GRAFT Right 01/26/2020   Procedure: IRRIGATION AND DEBRIDEMENT RIGHT FEMORAL POPLITEAL BYPASS SITE;  Surgeon: Waynetta Sandy, MD;  Location: [facility];  Service: Vascular;  Laterality: Right;   MULTIPLE TOOTH EXTRACTIONS     ORCHIECTOMY     PERIPHERAL VASCULAR INTERVENTION Left 08/20/2019   Procedure: PERIPHERAL VASCULAR INTERVENTION;  Surgeon: Marty Heck, MD;  Location: [facility];  Service: Cardiovascular;  Laterality: Left;  common/external iliac   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   Procedure: TRANSMETATARSAL AMPUTATION;  Surgeon: Waynetta Sandy, MD;  Location: [facility];  Service: Vascular;  Laterality: Right;   TRANSMETATARSAL AMPUTATION Right 08/26/2020   Procedure: TRANSMETATARSAL AMPUTATION REVISION;  Surgeon: Criselda Peaches, DPM;  Location: [facility];  Service: Podiatry;  Laterality: Right;   VASECTOMY     WOUND DEBRIDEMENT Right 08/26/2020   Procedure: EXCISION WOUND;  Surgeon: Criselda Peaches, DPM;  Location: [facility];  Service: Podiatry;  Laterality: Right;   Family History  Problem Relation Age of Onset   Hypertension Father    Coronary artery disease Father    Coronary artery disease Mother    Cancer Mother        Breast   Social History   Socioeconomic History   Marital status: Legally Separated    Spouse name: Not on file   Number of children: Not on file   Years of education: 3   Highest education level: 3rd grade  Occupational History   Occupation: Art gallery manager: [employer]  Tobacco Use   Smoking status: Every Day    Packs/day: 1.00    Years: 30.00    Pack years: 30.00    Types: Cigarettes   Smokeless tobacco: Former    Types: Nurse, children's Use: Never used  Substance and Sexual Activity   Alcohol use: No   Drug use: No   Sexual activity: Not Currently  Other Topics Concern   Not on file  Social History Narrative   Not on file   Social Determinants of Health   Financial Resource Strain: Low  Risk    Difficulty of Paying Living Expenses: Not hard at all  Food Insecurity: No Food Insecurity   Worried About [place] in the Last Year: Never true   Ran Out of Food in the Last Year: Never true  Transportation Needs: No Transportation Needs   Lack of Transportation (Medical): No   Lack of Transportation (Non-Medical): No  Physical Activity: Inactive   Days of Exercise per Week: 0 days   Minutes of Exercise per Session: 0 min  Stress: No Stress Concern Present   Feeling of Stress : Only a little  Social Connections: Socially Isolated   Frequency of Communication with Friends and Family: Three times a week   Frequency of Social Gatherings with Friends and Family: Never   Attends Religious Services: Never   Marine scientist or Organizations: No   Attends Music therapist: Never   Marital Status: Divorced    Tobacco Counseling Ready to quit: Not Answered Counseling given: Not Answered   Clinical  Intake:  Pre-visit preparation completed: Yes  Pain : No/denies pain     BMI - recorded: 20.64 Nutritional Status: BMI of 19-24  Normal Nutritional Risks: None Diabetes: No (Pt states he is unsure. Pt states he does not take medication and does not check blood sugars.)  How often do you need to have someone help you when you read instructions, pamphlets, or other written materials from your doctor or pharmacy?: 1 - Never  Diabetic?NO. Pt has diabetes dx but is not on medication and does not check blood sugars.  Interpreter Needed?: No  Information entered by :: MJ Culver Feighner, LPN   Activities of Daily Living In your present state of health, do you have any difficulty performing the following activities: 09/19/2021 06/22/2021  Hearing? N N  Vision? N Y  Difficulty concentrating or making decisions? N Y  Comment - mainly due to 3rd grade education  Walking or climbing stairs? N -  Comment - -  Dressing or bathing? N -  Comment - -  Doing errands, shopping? [place] and eating ? N -  Comment - -  Using the Toilet? N -  In the past six months, have you accidently leaked urine? N -  Comment - -  Do you have problems with loss of bowel control? N -  Managing your Medications? N -  Comment - -  Managing your Finances? N -  Comment - -  Housekeeping or managing your Housekeeping? N -  Comment - -  Some recent data might be hidden    Patient Care Team: [name], MD as PCP - General (Family Medicine)  Indicate any recent Medical Services you may have received from other than [facility] providers in the past year (date may be approximate).     Assessment:   This is a routine wellness examination for [place].  Hearing/Vision screen Hearing Screening - Comments:: No hearing issues.  Vision Screening - Comments:: Reading glasses. Had Lasix surgery 2022.   Dietary issues and exercise activities discussed: Current Exercise Habits: The patient does not  participate in regular exercise at present, Exercise limited by: cardiac condition(s);respiratory conditions(s)   Goals Addressed             This Visit's Progress    Exercise 3x per week (30 min per time)       Get car back to running.       Depression Screen  PHQ 2/9 Scores 09/19/2021 05/29/2021 05/24/2021 10/12/2020 10/12/2020 05/24/2020 05/02/2020  PHQ - 2 Score 1 0 - 1 1 1  0  PHQ- 9 Score - - - - - - -  Exception Documentation - - Other- indicate reason in comment box Other- indicate reason in comment box - - -  Not completed - - unable to ask-  spoke with neice HCPOA According to niece [name] [name] - - -    Fall Risk Fall Risk  09/19/2021 05/29/2021 05/24/2021 10/12/2020 10/04/2020  Falls in the past year? 0 0 0 0 0  Number falls in past yr: 0 0 - 0 -  Injury with Fall? 0 0 - 0 -  Risk for fall due to : Impaired balance/gait No Fall Risks - History of fall(s);Impaired balance/gait;Impaired mobility;Orthopedic patient -  Follow up Falls prevention discussed Falls evaluation completed - Education provided;Falls prevention discussed Falls evaluation completed    FALL RISK PREVENTION PERTAINING TO THE HOME:  Any stairs in or around the home? No  If so, are there any without handrails? No  Home free of loose throw rugs in walkways, pet beds, electrical cords, etc? Yes  Adequate lighting in your home to reduce risk of falls? Yes   ASSISTIVE DEVICES UTILIZED TO PREVENT FALLS:  Life alert? No  Use of a cane, walker or w/c? No  Grab bars in the bathroom? Yes  Shower chair or bench in shower? Yes  Elevated toilet seat or a handicapped toilet? Yes   TIMED UP AND GO:  Was the test performed? Yes .  Length of time to ambulate 10 feet: 7 sec.   Gait steady and fast without use of assistive device  Cognitive Function: Normal cognitive status assessed by direct observation by this Nurse Health Advisor. No abnormalities found.        Immunizations Immunization History   Administered Date(s) Administered   Pneumococcal Conjugate-13 10/11/2015   Pneumococcal Polysaccharide-23 07/31/2018    TDAP status: Due, Education has been provided regarding the importance of this vaccine. Advised may receive this vaccine at local pharmacy or Health Dept. Aware to provide a copy of the vaccination record if obtained from local pharmacy or Health Dept. Verbalized acceptance and understanding.  Flu Vaccine status: Declined, Education has been provided regarding the importance of this vaccine but patient still declined. Advised may receive this vaccine at local pharmacy or Health Dept. Aware to provide a copy of the vaccination record if obtained from local pharmacy or Health Dept. Verbalized acceptance and understanding.  Pneumococcal vaccine status: Up to date  Covid-19 vaccine status: Declined, Education has been provided regarding the importance of this vaccine but patient still declined. Advised may receive this vaccine at local pharmacy or Health Dept.or vaccine clinic. Aware to provide a copy of the vaccination record if obtained from local pharmacy or Health Dept. Verbalized acceptance and understanding.  Qualifies for Shingles Vaccine? Yes   Zostavax completed No   Shingrix Completed?: No.    Education has been provided regarding the importance of this vaccine. Patient has been advised to call insurance company to determine out of pocket expense if they have not yet received this vaccine. Advised may also receive vaccine at local pharmacy or Health Dept. Verbalized acceptance and understanding.  Screening Tests Health Maintenance  Topic Date Due   FOOT EXAM  Never done   OPHTHALMOLOGY EXAM  Never done   URINE MICROALBUMIN  Never done   Hepatitis C Screening  Never done   TETANUS/TDAP  Never done   Zoster Vaccines- Shingrix (1 of 2) Never done   COLONOSCOPY (Pts 45-3yrs Insurance coverage will need to be confirmed)  Never done   HEMOGLOBIN A1C  12/02/2021   HPV  VACCINES  Aged Out   INFLUENZA VACCINE  Discontinued   COVID-19 Vaccine  Discontinued    Health Maintenance  Health Maintenance Due  Topic Date Due   FOOT EXAM  Never done   OPHTHALMOLOGY EXAM  Never done   URINE MICROALBUMIN  Never done   Hepatitis C Screening  Never done   TETANUS/TDAP  Never done   Zoster Vaccines- Shingrix (1 of 2) Never done   COLONOSCOPY (Pts 45-14yrs Insurance coverage will need to be confirmed)  Never done    Colorectal cancer screening: Referral to GI placed 09/19/2021. Pt aware the office will call re: appt.  Lung Cancer Screening: (Low Dose CT Chest recommended if Age 31-80 years, 30 pack-year currently smoking OR have quit w/in 15years.) does qualify.   Lung Cancer Screening Referral: Declined.  Additional Screening:  Hepatitis C Screening: does qualify; Completed not done.  Vision Screening: Recommended annual ophthalmology exams for early detection of glaucoma and other disorders of the eye. Is the patient up to date with their annual eye exam?  No  Who is the provider or what is the name of the office in which the patient attends annual eye exams? Pt does not go. Had Lasix surgery in 2020. If pt is not established with a provider, would they like to be referred to a provider to establish care? No .   Dental Screening: Recommended annual dental exams for proper oral hygiene  Community Resource Referral / Chronic Care Management: CRR required this visit?  No   CCM required this visit?  No      Plan:     I have personally reviewed and noted the following in the patient's chart:   Medical and social history Use of alcohol, tobacco or illicit drugs  Current medications and supplements including opioid prescriptions. Patient is not currently taking opioid prescriptions. Functional ability and status Nutritional status Physical activity Advanced directives List of other physicians Hospitalizations, surgeries, and ER visits in previous 12  months Vitals Screenings to include cognitive, depression, and falls Referrals and appointments  In addition, I have reviewed and discussed with patient certain preventive protocols, quality metrics, and best practice recommendations. A written personalized care plan for preventive services as well as general preventive health recommendations were provided to patient.     Chriss Driver, LPN   24/07/2499   Nurse Notes: Pt c/o abdominal pain after eating for several weeks. Pt states he has appointment with Dr. Wolfgang Phoenix tomorrow, 09/20/21 and will discuss then. Pt also asks if he is diabetic. Pt states he has been told that he is but he is not currently on medication or checking blood sugars. Pt did agree on referral for colonoscopy.

## 2021-09-19 NOTE — Patient Instructions (Signed)
Mr. Houseworth , Thank you for taking time to come for your Medicare Wellness Visit. I appreciate your ongoing commitment to your health goals. Please review the following plan we discussed and let me know if I can assist you in the future.   Screening recommendations/referrals: Colonoscopy: Due Repeat in 10 years  Recommended yearly ophthalmology/optometry visit for glaucoma screening and checkup Recommended yearly dental visit for hygiene and checkup  Vaccinations: Influenza vaccine: Declined Pneumococcal vaccine: Done 10/11/2015 and 07/31/2018  Tdap vaccine: Repeat in 10 years  Shingles vaccine: Shingrix discussed. Please contact your pharmacy for coverage information.     Covid-19: Declines  Advanced directives: Advance directive discussed with you today. I have provided a copy for you to complete at home and have notarized. Once this is complete please bring a copy in to our office so we can scan it into your chart.   Conditions/risks identified: Aim for 30 minutes of exercise or walking each day, drink 6-8 glasses of water and eat lots of fruits and vegetables.   Next appointment: Follow up in one year for your annual wellness visit. 2023  Preventive Care 71 Years and Older, Male  Preventive care refers to lifestyle choices and visits with your health care provider that can promote health and wellness. What does preventive care include? A yearly physical exam. This is also called an annual well check. Dental exams once or twice a year. Routine eye exams. Ask your health care provider how often you should have your eyes checked. Personal lifestyle choices, including: Daily care of your teeth and gums. Regular physical activity. Eating a healthy diet. Avoiding tobacco and drug use. Limiting alcohol use. Practicing safe sex. Taking low doses of aspirin every day. Taking vitamin and mineral supplements as recommended by your health care provider. What happens during an annual  well check? The services and screenings done by your health care provider during your annual well check will depend on your age, overall health, lifestyle risk factors, and family history of disease. Counseling  Your health care provider may ask you questions about your: Alcohol use. Tobacco use. Drug use. Emotional well-being. Home and relationship well-being. Sexual activity. Eating habits. History of falls. Memory and ability to understand (cognition). Work and work Statistician. Screening  You may have the following tests or measurements: Height, weight, and BMI. Blood pressure. Lipid and cholesterol levels. These may be checked every 5 years, or more frequently if you are over 35 years old. Skin check. Lung cancer screening. You may have this screening every year starting at age 71 if you have a 30-pack-year history of smoking and currently smoke or have quit within the past 15 years. Fecal occult blood test (FOBT) of the stool. You may have this test every year starting at age 71. Flexible sigmoidoscopy or colonoscopy. You may have a sigmoidoscopy every 5 years or a colonoscopy every 10 years starting at age 71. Prostate cancer screening. Recommendations will vary depending on your family history and other risks. Hepatitis C blood test. Hepatitis B blood test. Sexually transmitted disease (STD) testing. Diabetes screening. This is done by checking your blood sugar (glucose) after you have not eaten for a while (fasting). You may have this done every 1-3 years. Abdominal aortic aneurysm (AAA) screening. You may need this if you are a current or former smoker. Osteoporosis. You may be screened starting at age 71 if you are at high risk. Talk with your health care provider about your test results, treatment options, and if  necessary, the need for more tests. Vaccines  Your health care provider may recommend certain vaccines, such as: Influenza vaccine. This is recommended every  year. Tetanus, diphtheria, and acellular pertussis (Tdap, Td) vaccine. You may need a Td booster every 10 years. Zoster vaccine. You may need this after age 71. Pneumococcal 13-valent conjugate (PCV13) vaccine. One dose is recommended after age 71. Pneumococcal polysaccharide (PPSV23) vaccine. One dose is recommended after age 71. Talk to your health care provider about which screenings and vaccines you need and how often you need them. This information is not intended to replace advice given to you by your health care provider. Make sure you discuss any questions you have with your health care provider. Document Released: 12/30/2015 Document Revised: 08/22/2016 Document Reviewed: 10/04/2015 Elsevier Interactive Patient Education  2017 [employer] Prevention in the Home Falls can cause injuries. They can happen to people of all ages. There are many things you can do to make your home safe and to help prevent falls. What can I do on the outside of my home? Regularly fix the edges of walkways and driveways and fix any cracks. Remove anything that might make you trip as you walk through a door, such as a raised step or threshold. Trim any bushes or trees on the path to your home. Use bright outdoor lighting. Clear any walking paths of anything that might make someone trip, such as rocks or tools. Regularly check to see if handrails are loose or broken. Make sure that both sides of any steps have handrails. Any raised decks and porches should have guardrails on the edges. Have any leaves, snow, or ice cleared regularly. Use sand or salt on walking paths during winter. Clean up any spills in your garage right away. This includes oil or grease spills. What can I do in the bathroom? Use night lights. Install grab bars by the toilet and in the tub and shower. Do not use towel bars as grab bars. Use non-skid mats or decals in the tub or shower. If you need to sit down in the shower, use a  plastic, non-slip stool. Keep the floor dry. Clean up any water that spills on the floor as soon as it happens. Remove soap buildup in the tub or shower regularly. Attach bath mats securely with double-sided non-slip rug tape. Do not have throw rugs and other things on the floor that can make you trip. What can I do in the bedroom? Use night lights. Make sure that you have a light by your bed that is easy to reach. Do not use any sheets or blankets that are too big for your bed. They should not hang down onto the floor. Have a firm chair that has side arms. You can use this for support while you get dressed. Do not have throw rugs and other things on the floor that can make you trip. What can I do in the kitchen? Clean up any spills right away. Avoid walking on wet floors. Keep items that you use a lot in easy-to-reach places. If you need to reach something above you, use a strong step stool that has a grab bar. Keep electrical cords out of the way. Do not use floor polish or wax that makes floors slippery. If you must use wax, use non-skid floor wax. Do not have throw rugs and other things on the floor that can make you trip. What can I do with my stairs? Do not leave any items  on the stairs. Make sure that there are handrails on both sides of the stairs and use them. Fix handrails that are broken or loose. Make sure that handrails are as long as the stairways. Check any carpeting to make sure that it is firmly attached to the stairs. Fix any carpet that is loose or worn. Avoid having throw rugs at the top or bottom of the stairs. If you do have throw rugs, attach them to the floor with carpet tape. Make sure that you have a light switch at the top of the stairs and the bottom of the stairs. If you do not have them, ask someone to add them for you. What else can I do to help prevent falls? Wear shoes that: Do not have high heels. Have rubber bottoms. Are comfortable and fit you  well. Are closed at the toe. Do not wear sandals. If you use a stepladder: Make sure that it is fully opened. Do not climb a closed stepladder. Make sure that both sides of the stepladder are locked into place. Ask someone to hold it for you, if possible. Clearly mark and make sure that you can see: Any grab bars or handrails. First and last steps. Where the edge of each step is. Use tools that help you move around (mobility aids) if they are needed. These include: Canes. Walkers. Scooters. Crutches. Turn on the lights when you go into a dark area. Replace any light bulbs as soon as they burn out. Set up your furniture so you have a clear path. Avoid moving your furniture around. If any of your floors are uneven, fix them. If there are any pets around you, be aware of where they are. Review your medicines with your doctor. Some medicines can make you feel dizzy. This can increase your chance of falling. Ask your doctor what other things that you can do to help prevent falls. This information is not intended to replace advice given to you by your health care provider. Make sure you discuss any questions you have with your health care provider. Document Released: 09/29/2009 Document Revised: 05/10/2016 Document Reviewed: 01/07/2015 Elsevier Interactive Patient Education  2017 [employer].

## 2021-09-20 ENCOUNTER — Encounter (INDEPENDENT_AMBULATORY_CARE_PROVIDER_SITE_OTHER): Payer: Self-pay | Admitting: *Deleted

## 2021-09-20 ENCOUNTER — Ambulatory Visit (INDEPENDENT_AMBULATORY_CARE_PROVIDER_SITE_OTHER): Payer: Medicare Other | Admitting: Family Medicine

## 2021-09-20 VITALS — BP 155/82 | HR 67 | Temp 97.9°F | Ht 72.0 in | Wt 152.0 lb

## 2021-09-20 DIAGNOSIS — L0291 Cutaneous abscess, unspecified: Secondary | ICD-10-CM

## 2021-09-20 MED ORDER — DOXYCYCLINE HYCLATE 100 MG PO TABS: 100.0000 mg | ORAL_TABLET | Freq: Two times a day (BID) | ORAL | 0 refills | Status: DC

## 2021-09-20 NOTE — Progress Notes (Signed)
   Subjective:    Patient ID: Raymond Walker., male    DOB: Feb 15, 1951, 70 y.o.   MRN: [id number]  HPI Cyst between shoulder blades for approx 1 yr Has popped and drained clear and white fluid Patient relates that he had an abscess on his upper back And became larger It finally drained on its own Now somewhat tender Denies fever chills sweats  Review of Systems     Objective:   Physical Exam  Abscess noted on the upper back partially drained      Assessment & Plan:  Procedure With patient consent area was numbed with 1% lidocaine with epinephrine Betadine was used to clean it 11.  Blade was used under sterile conditions to cut into the abscess with some drainage Packing was placed Antibiotics for 5 days with snack and a tall glass of water Follow-up in 24 hours to have packing removed Follow-up sooner problems

## 2021-09-21 ENCOUNTER — Ambulatory Visit (INDEPENDENT_AMBULATORY_CARE_PROVIDER_SITE_OTHER): Payer: Medicare Other | Admitting: Family Medicine

## 2021-09-21 ENCOUNTER — Other Ambulatory Visit: Payer: Self-pay

## 2021-09-21 ENCOUNTER — Encounter: Payer: Self-pay | Admitting: Family Medicine

## 2021-09-21 VITALS — Temp 97.4°F | Wt 151.2 lb

## 2021-09-21 DIAGNOSIS — L0291 Cutaneous abscess, unspecified: Secondary | ICD-10-CM

## 2021-09-21 MED ORDER — MUPIROCIN 2 % EX OINT: TOPICAL_OINTMENT | CUTANEOUS | 0 refills | Status: DC

## 2021-09-21 NOTE — Progress Notes (Signed)
   Subjective:    Patient ID: Raymond Walker., male    DOB: 08/28/1951, 70 y.o.   MRN: [id number]  HPI Pt here to have packing removed. Pt had abscess drained yesterday.  There was an abscess in the upper back it was drained packing was placed here today for follow-up  Review of Systems     Objective:   Physical Exam  Small amount of tenderness some slight foul odor to it packing was removed without difficulty there is no residual abscess is more cellulitis at this point with opening where the incision was made for I&D      Assessment & Plan:   Abscess recheck Improving Continue antibiotics Bactroban ointment applied daily If ongoing troubles follow-up Otherwise keep all regular follow-up visits

## 2021-10-01 ENCOUNTER — Other Ambulatory Visit: Payer: Self-pay | Admitting: Family Medicine

## 2021-10-03 ENCOUNTER — Ambulatory Visit: Payer: Medicare Other | Admitting: Family Medicine

## 2021-10-19 ENCOUNTER — Ambulatory Visit: Payer: Medicare Other | Admitting: Family Medicine

## 2021-11-02 ENCOUNTER — Ambulatory Visit: Payer: Medicare Other | Admitting: Cardiology

## 2021-11-02 NOTE — Progress Notes (Signed)
Cardiology Office Note:    Date:  11/03/2021   ID:  Raymond Radon., DOB April 10, 1951, MRN [id number]  PCP:  [name], MD  Cardiologist:  None  Electrophysiologist:  None   Referring MD: [name], MD   Chief Complaint  Patient presents with   Shortness of Breath    History of Present Illness:    Raymond Tinnel. is a 70 y.o. male with a hx of COPD, PAD, hypertension who is referred by Dr. Wolfgang Phoenix for evaluation of dyspnea.  He has a history of PAD status post right CFA to AK popliteal bypass and amputation of right toes on 09/15/2019.  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  Follows with vascular surgery, last seen 06/2020.  Reports he has been short of breath with minimal exertion.  Denies any chest pain.  Denies any lightheadedness, syncope, lower extremity edema, or palpitations.  He continues to smoke 1 pack/day.  Echo 08/20/19 with normal biventricular function, grade 2 diastolic dysfunction  BP Readings from Last 3 Encounters:  11/03/21 (!) 150/70  09/20/21 (!) 155/82  09/19/21 118/68    Past Medical History:  Diagnosis Date   Caregiver with fatigue 05/02/2020   COPD (chronic obstructive pulmonary disease) (HCC)    Deformity    equinus and metatarsal   Erectile dysfunction    Erectile dysfunction 05/02/2020   Full dentures    History of kidney stones    History of seizures 05/31/2020   Between ages 57 and 53   Hypertension    Impaired glucose tolerance    Pancreatitis, recurrent    Pneumonia    Thrombocytopenia ([place]) 08/10/2018   Staying in the low 100s will follow closely    Past Surgical History:  Procedure Laterality Date   ABDOMINAL AORTOGRAM W/LOWER EXTREMITY Bilateral 08/20/2019   Procedure: ABDOMINAL AORTOGRAM W/LOWER EXTREMITY;  Surgeon: Marty Heck, MD;  Location: [facility];  Service: Cardiovascular;  Laterality: Bilateral;   ACHILLES TENDON SURGERY Right 08/26/2020    Procedure: ACHILLES TENDON LENGTHENING;  Surgeon: Criselda Peaches, DPM;  Location: [facility];  Service: Podiatry;  Laterality: Right;   AMPUTATION Right 09/15/2019   Procedure: AMPUTATION RIGHT TOES One, Two, And Three;  Surgeon: Waynetta Sandy, MD;  Location: [facility];  Service: Vascular;  Laterality: Right;   APPENDECTOMY     CATARACT EXTRACTION W/PHACO Right 05/02/2018   Procedure: CATARACT EXTRACTION PHACO AND INTRAOCULAR LENS PLACEMENT ([place]);  Surgeon: Baruch Goldmann, MD;  Location: [facility];  Service: Ophthalmology;  Laterality: Right;  CDE: 11.11   CATARACT EXTRACTION W/PHACO Left 07/04/2018   Procedure: CATARACT EXTRACTION PHACO AND INTRAOCULAR LENS PLACEMENT ([facility]);  Surgeon: Baruch Goldmann, MD;  Location: [facility];  Service: Ophthalmology;  Laterality: Left;  CDE: 7.15   CHOLECYSTECTOMY     FEMORAL-POPLITEAL BYPASS GRAFT Right 09/15/2019   Procedure: Right BYPASS GRAFT FEMORAL to Above Knee POPLITEAL ARTERY;  Surgeon: Waynetta Sandy, MD;  Location: [facility];  Service: Vascular;  Laterality: Right;   FEMORAL-POPLITEAL BYPASS GRAFT Right 01/26/2020   Procedure: IRRIGATION AND DEBRIDEMENT RIGHT FEMORAL POPLITEAL BYPASS SITE;  Surgeon: Waynetta Sandy, MD;  Location: [place];  Service: Vascular;  Laterality: Right;   MULTIPLE TOOTH EXTRACTIONS     ORCHIECTOMY     PERIPHERAL VASCULAR INTERVENTION Left 08/20/2019   Procedure: PERIPHERAL VASCULAR INTERVENTION;  Surgeon: Marty Heck, MD;  Location: [facility];  Service: Cardiovascular;  Laterality: Left;  common/external iliac  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   Procedure: TRANSMETATARSAL AMPUTATION;  Surgeon: Waynetta Sandy, MD;  Location: [facility];  Service: Vascular;  Laterality: Right;   TRANSMETATARSAL AMPUTATION Right 08/26/2020   Procedure: TRANSMETATARSAL AMPUTATION REVISION;  Surgeon: Criselda Peaches, DPM;  Location: [facility];  Service: Podiatry;  Laterality: Right;   VASECTOMY     WOUND  DEBRIDEMENT Right 08/26/2020   Procedure: EXCISION WOUND;  Surgeon: Criselda Peaches, DPM;  Location: [facility];  Service: Podiatry;  Laterality: Right;    Current Medications: Current Meds  Medication Sig   clopidogrel (PLAVIX) 75 MG tablet TAKE 1 TABLET(75 MG) BY MOUTH DAILY WITH BREAKFAST   pantoprazole (PROTONIX) 40 MG tablet TAKE 1 TABLET(40 MG) BY MOUTH DAILY   rosuvastatin (CRESTOR) 20 MG tablet TAKE 1 TABLET(20 MG) BY MOUTH DAILY   sucralfate (CARAFATE) 1 g tablet TAKE 1 TABLET(1 GRAM) BY MOUTH FOUR TIMES DAILY AT BEDTIME WITH MEALS     Allergies:   Patient has no known allergies.   Social History   Socioeconomic History   Marital status: Legally Separated    Spouse name: Not on file   Number of children: Not on file   Years of education: 3   Highest education level: 3rd grade  Occupational History   Occupation: Art gallery manager: [employer]  Tobacco Use   Smoking status: Every Day    Packs/day: 1.00    Years: 30.00    Pack years: 30.00    Types: Cigarettes   Smokeless tobacco: Former    Types: Nurse, children's Use: Never used  Substance and Sexual Activity   Alcohol use: No   Drug use: No   Sexual activity: Not Currently  Other Topics Concern   Not on file  Social History Narrative   Not on file   Social Determinants of Health   Financial Resource Strain: Low Risk    Difficulty of Paying Living Expenses: Not hard at all  Food Insecurity: No Food Insecurity   Worried About Charity fundraiser in the Last Year: Never true   Arboriculturist in the Last Year: Never true  Transportation Needs: No Transportation Needs   Lack of Transportation (Medical): No   Lack of Transportation (Non-Medical): No  Physical Activity: Inactive   Days of Exercise per Week: 0 days   Minutes of Exercise per Session: 0 min  Stress: No Stress Concern Present   Feeling of Stress : Only a little  Social Connections: Socially Isolated   Frequency of  Communication with Friends and Family: Three times a week   Frequency of Social Gatherings with Friends and Family: Never   Attends Religious Services: Never   Marine scientist or Organizations: No   Attends Music therapist: Never   Marital Status: Divorced     Family History: The patient's  family history includes Cancer in his mother; Coronary artery disease in his father and mother; Hypertension in his father.  ROS:   Please see the history of present illness.      All other systems reviewed and are negative.  EKGs/Labs/Other Studies Reviewed:    The following studies were reviewed today:   EKG:  EKG is ordered today.  The ekg ordered today demonstrates sinus rhythm, frequent PACs, rate 81  Recent Labs: 07/26/2021: ALT 50; BUN 16; Creatinine, Ser 0.91; Hemoglobin 15.6; Platelets 130; Potassium 3.8; Sodium 137  Recent Lipid Panel  Component Value Date/Time   CHOL 113 06/02/2021 1101   TRIG 79 06/02/2021 1101   HDL 31 (L) 06/02/2021 1101   CHOLHDL 3.6 06/02/2021 1101   CHOLHDL 5.3 (H) 08/01/2018 1126   LDLCALC 66 06/02/2021 1101   LDLCALC 80 08/01/2018 1126    Physical Exam:    VS:  BP (!) 150/70   Pulse (!) 50   Ht 6' (1.829 m)   Wt 152 lb (68.9 kg)   SpO2 99%   BMI 20.61 kg/m     Wt Readings from Last 3 Encounters:  11/03/21 152 lb (68.9 kg)  09/21/21 151 lb 3.2 oz (68.6 kg)  09/20/21 152 lb (68.9 kg)     GEN: in no acute distress HEENT: Normal NECK: No JVD CARDIAC: Irregular, normal rate, no murmurs, rubs, gallops RESPIRATORY:  Clear to auscultation without rales, wheezing or rhonchi  ABDOMEN: Soft, non-tender, non-distended MUSCULOSKELETAL:  No edema; No deformity  SKIN: Warm and dry NEUROLOGIC:  Alert and oriented x 3 PSYCHIATRIC:  Normal affect   ASSESSMENT:    1. DOE (dyspnea on exertion)   2. Medication management   3. Coronary artery disease involving native coronary artery of native heart, unspecified whether angina  present   4. Premature atrial contractions   5. PAC (premature atrial contraction)   6. PAD (peripheral artery disease) ([place])   7. Essential hypertension    PLAN:    Dyspnea: He reports dyspnea with minimal exertion.  Multivessel coronary calcifications on CT chest 08/2021 -Lexiscan Myoview -Echocardiogram  CAD: Multivessel coronary calcifications on CT chest 08/2021 -Continue Plavix, statin.  LDL 66 on 06/02/2021 -Lexiscan Myoview as above  Hyperlipidemia: On rosuvastatin 20 mg daily.  LDL 66 on 06/02/21  PAD:He has a history of PAD status post right CFA to AK popliteal bypass and amputation of right toes on 09/15/2019.  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  Follows with vascular surgery, last seen 06/2020. -Continue Plavix, statin.  Has not been seen by vascular surgery since 06/2020.  Will refer  Hypertension: BP 150/70.  Reports SBP 130s to 160s at home.  Will start losartan 25 mg daily.  Check BMP in 1 week  Frequent PACs: Will check Zio patch x3 days  RTC in 3 months  Shared Decision Making/Informed Consent The risks [chest pain, shortness of breath, cardiac arrhythmias, dizziness, blood pressure fluctuations, myocardial infarction, stroke/transient ischemic attack, nausea, vomiting, allergic reaction, radiation exposure, metallic taste sensation and life-threatening complications (estimated to be 1 in 10,000)], benefits (risk stratification, diagnosing coronary artery disease, treatment guidance) and alternatives of a nuclear stress test were discussed in detail with Raymond Walker and he agrees to proceed.   Medication Adjustments/Labs and Tests Ordered: Current medicines are reviewed at length with the patient today.  Concerns regarding medicines are outlined above.  Orders Placed This Encounter  Procedures   NM Myocar Multi W/Spect W/Wall Motion / EF   Basic metabolic panel   TSH   Magnesium   Ambulatory referral to  Vascular Surgery   LONG TERM MONITOR (3-14 DAYS)   EKG 12-Lead   ECHOCARDIOGRAM COMPLETE   No orders of the defined types were placed in this encounter.   Patient Instructions  Medication Instructions:   START Losartan 25 mg daily   Labwork:  BMET, TSH, Magnesium in 1 week (11/28)  Testing/Procedures: Your physician has requested that you have a lexiscan myoview. For further information please visit [email or web address]. Please follow instruction sheet, as given.   Your  physician has requested that you have an echocardiogram. Echocardiography is a painless test that uses sound waves to create images of your heart. It provides your doctor with information about the size and shape of your heart and how well your heart's chambers and valves are working. This procedure takes approximately one hour. There are no restrictions for this procedure.   ZIO XT- Long Term Monitor Instructions   Your physician has requested you wear your ZIO patch monitor____3___days.   This is a single patch monitor.  [employer] supplies one patch monitor per enrollment.  Additional stickers are not available.   Please do not apply patch if you will be having a Nuclear Stress Test, Echocardiogram, Cardiac CT, MRI, or Chest Xray during the time frame you would be wearing the monitor. The patch cannot be worn during these tests.  You cannot remove and re-apply the ZIO XT patch monitor.      Do not shower for the first 24 hours.  You may shower after the first 24 hours.   Press button if you feel a symptom. You will hear a small click.  Record Date, Time and Symptom in the Patient Log Book.   When you are ready to remove patch, follow instructions on last 2 pages of Patient Log Book.  Stick patch monitor onto last page of Patient Log Book.   Place Patient Log Book in [place] box.  Use locking tab on box and tape box closed securely.  The [employer] has [employer] on it.  Please place in mailbox as soon  as possible.  Your physician should have your test results approximately 7 days after the monitor has been mailed back to [facility].   Call [facility] at [phone] if you have questions regarding your ZIO XT patch monitor.  Call them immediately if you see an orange light blinking on your monitor.   If your monitor falls off in less than 4 days contact our Monitor department at [phone].  If your monitor becomes loose or falls off after 4 days call [employer] at [phone] for suggestions on securing your monitor.   Follow-Up: 3 months with MD  Any Other Special Instructions Will Be Listed Below (If Applicable).   We have referred you back to [facility] (vein and vascular specialist). They will call you to arrange an appointment  If you need a refill on your cardiac medications before your next appointment, please call your pharmacy.   Signed, Donato Heinz, MD  11/03/2021 10:38 PM    [place]

## 2021-11-03 ENCOUNTER — Ambulatory Visit (INDEPENDENT_AMBULATORY_CARE_PROVIDER_SITE_OTHER): Payer: Medicare Other

## 2021-11-03 ENCOUNTER — Other Ambulatory Visit: Payer: Self-pay

## 2021-11-03 ENCOUNTER — Encounter: Payer: Self-pay | Admitting: Cardiology

## 2021-11-03 ENCOUNTER — Ambulatory Visit (INDEPENDENT_AMBULATORY_CARE_PROVIDER_SITE_OTHER): Payer: Medicare Other | Admitting: Cardiology

## 2021-11-03 VITALS — BP 150/70 | HR 50 | Ht 72.0 in | Wt 152.0 lb

## 2021-11-03 DIAGNOSIS — I491 Atrial premature depolarization: Secondary | ICD-10-CM

## 2021-11-03 DIAGNOSIS — R0609 Other forms of dyspnea: Secondary | ICD-10-CM

## 2021-11-03 DIAGNOSIS — Z79899 Other long term (current) drug therapy: Secondary | ICD-10-CM | POA: Diagnosis not present

## 2021-11-03 DIAGNOSIS — I10 Essential (primary) hypertension: Secondary | ICD-10-CM

## 2021-11-03 DIAGNOSIS — I739 Peripheral vascular disease, unspecified: Secondary | ICD-10-CM | POA: Diagnosis not present

## 2021-11-03 DIAGNOSIS — I2510 Atherosclerotic heart disease of native coronary artery without angina pectoris: Secondary | ICD-10-CM

## 2021-11-03 NOTE — Patient Instructions (Addendum)
Medication Instructions:   START Losartan 25 mg daily   Labwork:  BMET, TSH, Magnesium in 1 week (11/28)  Testing/Procedures: Your physician has requested that you have a lexiscan myoview. For further information please visit [email or web address]. Please follow instruction sheet, as given.   Your physician has requested that you have an echocardiogram. Echocardiography is a painless test that uses sound waves to create images of your heart. It provides your doctor with information about the size and shape of your heart and how well your heart's chambers and valves are working. This procedure takes approximately one hour. There are no restrictions for this procedure.   ZIO XT- Long Term Monitor Instructions   Your physician has requested you wear your ZIO patch monitor____3___days.   This is a single patch monitor.  [employer] supplies one patch monitor per enrollment.  Additional stickers are not available.   Please do not apply patch if you will be having a Nuclear Stress Test, Echocardiogram, Cardiac CT, MRI, or Chest Xray during the time frame you would be wearing the monitor. The patch cannot be worn during these tests.  You cannot remove and re-apply the ZIO XT patch monitor.      Do not shower for the first 24 hours.  You may shower after the first 24 hours.   Press button if you feel a symptom. You will hear a small click.  Record Date, Time and Symptom in the Patient Log Book.   When you are ready to remove patch, follow instructions on last 2 pages of Patient Log Book.  Stick patch monitor onto last page of Patient Log Book.   Place Patient Log Book in [place] box.  Use locking tab on box and tape box closed securely.  The [employer] has [employer] on it.  Please place in mailbox as soon as possible.  Your physician should have your test results approximately 7 days after the monitor has been mailed back to [facility]   Call [place] at  [phone] if you have questions regarding your ZIO XT patch monitor.  Call them immediately if you see an orange light blinking on your monitor.   If your monitor falls off in less than 4 days contact our Monitor department at [phone].  If your monitor becomes loose or falls off after 4 days call [employer] at [phone] for suggestions on securing your monitor.   Follow-Up: 3 months with MD  Any Other Special Instructions Will Be Listed Below (If Applicable).   We have referred you back to [facility] (vein and vascular specialist). They will call you to arrange an appointment  If you need a refill on your cardiac medications before your next appointment, please call your pharmacy.

## 2021-11-05 DIAGNOSIS — I491 Atrial premature depolarization: Secondary | ICD-10-CM | POA: Diagnosis not present

## 2021-11-15 ENCOUNTER — Encounter (HOSPITAL_COMMUNITY): Payer: Medicare Other

## 2021-11-17 DIAGNOSIS — I491 Atrial premature depolarization: Secondary | ICD-10-CM | POA: Diagnosis not present

## 2021-11-21 ENCOUNTER — Encounter (HOSPITAL_COMMUNITY): Payer: Self-pay

## 2021-11-21 ENCOUNTER — Telehealth: Payer: Self-pay | Admitting: *Deleted

## 2021-11-21 ENCOUNTER — Inpatient Hospital Stay (HOSPITAL_COMMUNITY)
Admission: EM | Admit: 2021-11-21 | Discharge: 2021-11-23 | DRG: 854 | Disposition: A | Discharge status: Home / Self Care | Payer: Medicare Other | Attending: Internal Medicine | Admitting: Internal Medicine

## 2021-11-21 ENCOUNTER — Emergency Department (HOSPITAL_COMMUNITY): Payer: Medicare Other

## 2021-11-21 ENCOUNTER — Other Ambulatory Visit: Payer: Self-pay

## 2021-11-21 DIAGNOSIS — W19XXXA Unspecified fall, initial encounter: Secondary | ICD-10-CM | POA: Diagnosis not present

## 2021-11-21 DIAGNOSIS — T45526A Underdosing of antithrombotic drugs, initial encounter: Secondary | ICD-10-CM | POA: Diagnosis not present

## 2021-11-21 DIAGNOSIS — N200 Calculus of kidney: Secondary | ICD-10-CM | POA: Diagnosis not present

## 2021-11-21 DIAGNOSIS — N179 Acute kidney failure, unspecified: Secondary | ICD-10-CM | POA: Diagnosis present

## 2021-11-21 DIAGNOSIS — A419 Sepsis, unspecified organism: Principal | ICD-10-CM | POA: Diagnosis present

## 2021-11-21 DIAGNOSIS — J449 Chronic obstructive pulmonary disease, unspecified: Secondary | ICD-10-CM | POA: Diagnosis not present

## 2021-11-21 DIAGNOSIS — E559 Vitamin D deficiency, unspecified: Secondary | ICD-10-CM | POA: Diagnosis not present

## 2021-11-21 DIAGNOSIS — Z89431 Acquired absence of right foot: Secondary | ICD-10-CM | POA: Diagnosis not present

## 2021-11-21 DIAGNOSIS — Z9049 Acquired absence of other specified parts of digestive tract: Secondary | ICD-10-CM | POA: Diagnosis not present

## 2021-11-21 DIAGNOSIS — Z20822 Contact with and (suspected) exposure to covid-19: Secondary | ICD-10-CM | POA: Diagnosis not present

## 2021-11-21 DIAGNOSIS — N202 Calculus of kidney with calculus of ureter: Secondary | ICD-10-CM | POA: Diagnosis not present

## 2021-11-21 DIAGNOSIS — I739 Peripheral vascular disease, unspecified: Secondary | ICD-10-CM | POA: Diagnosis not present

## 2021-11-21 DIAGNOSIS — R739 Hyperglycemia, unspecified: Secondary | ICD-10-CM | POA: Diagnosis not present

## 2021-11-21 DIAGNOSIS — Z79899 Other long term (current) drug therapy: Secondary | ICD-10-CM

## 2021-11-21 DIAGNOSIS — Z66 Do not resuscitate: Secondary | ICD-10-CM | POA: Diagnosis present

## 2021-11-21 DIAGNOSIS — Z91138 Patient's unintentional underdosing of medication regimen for other reason: Secondary | ICD-10-CM

## 2021-11-21 DIAGNOSIS — Z7902 Long term (current) use of antithrombotics/antiplatelets: Secondary | ICD-10-CM | POA: Diagnosis not present

## 2021-11-21 DIAGNOSIS — N201 Calculus of ureter: Secondary | ICD-10-CM | POA: Diagnosis not present

## 2021-11-21 DIAGNOSIS — R1110 Vomiting, unspecified: Secondary | ICD-10-CM | POA: Diagnosis not present

## 2021-11-21 DIAGNOSIS — E876 Hypokalemia: Secondary | ICD-10-CM | POA: Diagnosis not present

## 2021-11-21 DIAGNOSIS — N3080 Other cystitis without hematuria: Secondary | ICD-10-CM | POA: Diagnosis not present

## 2021-11-21 DIAGNOSIS — I70201 Unspecified atherosclerosis of native arteries of extremities, right leg: Secondary | ICD-10-CM | POA: Diagnosis present

## 2021-11-21 DIAGNOSIS — R6520 Severe sepsis without septic shock: Secondary | ICD-10-CM | POA: Diagnosis present

## 2021-11-21 DIAGNOSIS — F17210 Nicotine dependence, cigarettes, uncomplicated: Secondary | ICD-10-CM | POA: Diagnosis not present

## 2021-11-21 DIAGNOSIS — R42 Dizziness and giddiness: Secondary | ICD-10-CM | POA: Diagnosis not present

## 2021-11-21 DIAGNOSIS — Z95820 Peripheral vascular angioplasty status with implants and grafts: Secondary | ICD-10-CM

## 2021-11-21 DIAGNOSIS — N390 Urinary tract infection, site not specified: Secondary | ICD-10-CM | POA: Diagnosis not present

## 2021-11-21 DIAGNOSIS — Z743 Need for continuous supervision: Secondary | ICD-10-CM | POA: Diagnosis not present

## 2021-11-21 DIAGNOSIS — Z803 Family history of malignant neoplasm of breast: Secondary | ICD-10-CM

## 2021-11-21 DIAGNOSIS — Z8249 Family history of ischemic heart disease and other diseases of the circulatory system: Secondary | ICD-10-CM | POA: Diagnosis not present

## 2021-11-21 DIAGNOSIS — R0902 Hypoxemia: Secondary | ICD-10-CM | POA: Diagnosis not present

## 2021-11-21 DIAGNOSIS — J438 Other emphysema: Secondary | ICD-10-CM | POA: Diagnosis present

## 2021-11-21 DIAGNOSIS — I10 Essential (primary) hypertension: Secondary | ICD-10-CM | POA: Diagnosis not present

## 2021-11-21 DIAGNOSIS — R000 Tachycardia, unspecified: Secondary | ICD-10-CM | POA: Diagnosis not present

## 2021-11-21 DIAGNOSIS — R0600 Dyspnea, unspecified: Secondary | ICD-10-CM | POA: Diagnosis not present

## 2021-11-21 LAB — URINALYSIS, ROUTINE W REFLEX MICROSCOPIC
Bilirubin Urine: NEGATIVE
Glucose, UA: NEGATIVE mg/dL
Ketones, ur: NEGATIVE mg/dL
Nitrite: NEGATIVE
Protein, ur: 100 mg/dL — AB
RBC / HPF: >50 RBC/hpf — ABNORMAL HIGH (ref 0–5)
Specific Gravity, Urine: 1.019 (ref 1.005–1.030)
WBC, UA: >50 WBC/hpf — ABNORMAL HIGH (ref 0–5)
pH: 5 (ref 5.0–8.0)

## 2021-11-21 LAB — CBC WITH DIFFERENTIAL/PLATELET
Abs Immature Granulocytes: 0.11 10*3/uL — ABNORMAL HIGH (ref 0.00–0.07)
Basophils Absolute: 0.1 10*3/uL (ref 0.0–0.1)
Basophils Relative: 0 %
Eosinophils Absolute: 0 10*3/uL (ref 0.0–0.5)
Eosinophils Relative: 0 %
HCT: 45.4 % (ref 39.0–52.0)
Hemoglobin: 15.1 g/dL (ref 13.0–17.0)
Immature Granulocytes: 1 %
Lymphocytes Relative: 4 %
Lymphs Abs: 0.6 10*3/uL — ABNORMAL LOW (ref 0.7–4.0)
MCH: 29.4 pg (ref 26.0–34.0)
MCHC: 33.3 g/dL (ref 30.0–36.0)
MCV: 88.3 fL (ref 80.0–100.0)
Monocytes Absolute: 1.9 10*3/uL — ABNORMAL HIGH (ref 0.1–1.0)
Monocytes Relative: 14 %
Neutro Abs: 11.5 10*3/uL — ABNORMAL HIGH (ref 1.7–7.7)
Neutrophils Relative %: 81 %
Platelets: 135 10*3/uL — ABNORMAL LOW (ref 150–400)
RBC: 5.14 MIL/uL (ref 4.22–5.81)
RDW: 14.3 % (ref 11.5–15.5)
WBC: 14.2 10*3/uL — ABNORMAL HIGH (ref 4.0–10.5)
nRBC: 0 % (ref 0.0–0.2)

## 2021-11-21 LAB — LIPASE, BLOOD: Lipase: 24 U/L (ref 11–51)

## 2021-11-21 LAB — COMPREHENSIVE METABOLIC PANEL
ALT: 14 U/L (ref 0–44)
AST: 24 U/L (ref 15–41)
Albumin: 2.7 g/dL — ABNORMAL LOW (ref 3.5–5.0)
Alkaline Phosphatase: 46 U/L (ref 38–126)
Anion gap: 9 (ref 5–15)
BUN: 46 mg/dL — ABNORMAL HIGH (ref 8–23)
CO2: 23 mmol/L (ref 22–32)
Calcium: 8.6 mg/dL — ABNORMAL LOW (ref 8.9–10.3)
Chloride: 106 mmol/L (ref 98–111)
Creatinine, Ser: 1.29 mg/dL — ABNORMAL HIGH (ref 0.61–1.24)
GFR, Estimated: 59 mL/min — ABNORMAL LOW (ref >60)
Glucose, Bld: 153 mg/dL — ABNORMAL HIGH (ref 70–99)
Potassium: 4.7 mmol/L (ref 3.5–5.1)
Sodium: 138 mmol/L (ref 135–145)
Total Bilirubin: 0.3 mg/dL (ref 0.3–1.2)
Total Protein: 5.8 g/dL — ABNORMAL LOW (ref 6.5–8.1)

## 2021-11-21 LAB — RESP PANEL BY RT-PCR (FLU A&B, COVID) ARPGX2
Influenza A by PCR: NEGATIVE
Influenza B by PCR: NEGATIVE
SARS Coronavirus 2 by RT PCR: NEGATIVE

## 2021-11-21 LAB — LACTIC ACID, PLASMA
Lactic Acid, Venous: 2.9 mmol/L (ref 0.5–1.9)
Lactic Acid, Venous: 2.4 mmol/L (ref 0.5–1.9)
Lactic Acid, Venous: 1.7 mmol/L (ref 0.5–1.9)
Lactic Acid, Venous: 2 mmol/L (ref 0.5–1.9)

## 2021-11-21 LAB — PROCALCITONIN
Procalcitonin: 1.64 ng/mL
Procalcitonin: 2.04 ng/mL

## 2021-11-21 LAB — TROPONIN I (HIGH SENSITIVITY)
Troponin I (High Sensitivity): 11 ng/L (ref <18)
Troponin I (High Sensitivity): 21 ng/L — ABNORMAL HIGH (ref <18)

## 2021-11-21 MED ORDER — IOHEXOL 300 MG/ML  SOLN
100.0000 mL | Freq: Once | INTRAMUSCULAR | Status: AC | PRN
  Administered 2021-11-21: 100 mL via INTRAVENOUS

## 2021-11-21 MED ORDER — ALBUTEROL SULFATE HFA 108 (90 BASE) MCG/ACT IN AERS
2.0000 | INHALATION_SPRAY | RESPIRATORY_TRACT | Status: DC | PRN
  Administered 2021-11-21 – 2021-11-22 (×2): 2 via RESPIRATORY_TRACT
  Filled 2021-11-21: qty 6.7

## 2021-11-21 MED ORDER — ONDANSETRON HCL 4 MG/2ML IJ SOLN
4.0000 mg | Freq: Four times a day (QID) | INTRAMUSCULAR | Status: DC | PRN
  Filled 2021-11-21: qty 2

## 2021-11-21 MED ORDER — ACETAMINOPHEN 650 MG RE SUPP
650.0000 mg | Freq: Four times a day (QID) | RECTAL | Status: DC | PRN
  Administered 2021-11-21: 650 mg via RECTAL

## 2021-11-21 MED ORDER — LACTATED RINGERS IV SOLN: INTRAVENOUS | Status: AC

## 2021-11-21 MED ORDER — SODIUM CHLORIDE 0.9 % IV SOLN
2.0000 g | INTRAVENOUS | Status: DC
  Administered 2021-11-21 – 2021-11-23 (×3): 2 g via INTRAVENOUS
  Filled 2021-11-21 (×3): qty 20

## 2021-11-21 MED ORDER — SODIUM CHLORIDE 0.9 % IV BOLUS
1000.0000 mL | Freq: Once | INTRAVENOUS | Status: AC
  Administered 2021-11-21: 1000 mL via INTRAVENOUS

## 2021-11-21 MED ORDER — ACETAMINOPHEN 325 MG PO TABS
650.0000 mg | ORAL_TABLET | Freq: Four times a day (QID) | ORAL | Status: DC | PRN
  Filled 2021-11-21: qty 2

## 2021-11-21 MED ORDER — ALBUTEROL SULFATE HFA 108 (90 BASE) MCG/ACT IN AERS
2.0000 | INHALATION_SPRAY | Freq: Once | RESPIRATORY_TRACT | Status: AC
  Administered 2021-11-21: 2 via RESPIRATORY_TRACT
  Filled 2021-11-21: qty 6.7

## 2021-11-21 MED ORDER — FENTANYL CITRATE PF 50 MCG/ML IJ SOSY
50.0000 ug | PREFILLED_SYRINGE | Freq: Once | INTRAMUSCULAR | Status: AC
  Administered 2021-11-21: 50 ug via INTRAVENOUS
  Filled 2021-11-21: qty 1

## 2021-11-21 MED ORDER — SODIUM CHLORIDE 0.9 % IV SOLN: 2.0000 g | INTRAVENOUS | Status: DC

## 2021-11-21 MED ORDER — ONDANSETRON HCL 4 MG PO TABS: 4.0000 mg | ORAL_TABLET | Freq: Four times a day (QID) | ORAL | Status: DC | PRN

## 2021-11-21 MED ORDER — INFLUENZA VAC A&B SA ADJ QUAD 0.5 ML IM PRSY
0.5000 mL | PREFILLED_SYRINGE | INTRAMUSCULAR | Status: DC
  Filled 2021-11-21: qty 0.5

## 2021-11-21 MED ORDER — IPRATROPIUM-ALBUTEROL 0.5-2.5 (3) MG/3ML IN SOLN: 3.0000 mL | Freq: Four times a day (QID) | RESPIRATORY_TRACT | Status: DC | PRN

## 2021-11-21 MED ORDER — PANTOPRAZOLE SODIUM 40 MG PO TBEC
40.0000 mg | DELAYED_RELEASE_TABLET | Freq: Every day | ORAL | Status: DC
  Administered 2021-11-21 – 2021-11-23 (×3): 40 mg via ORAL
  Filled 2021-11-21 (×3): qty 1

## 2021-11-21 MED ORDER — ROSUVASTATIN CALCIUM 20 MG PO TABS
20.0000 mg | ORAL_TABLET | Freq: Every day | ORAL | Status: DC
  Administered 2021-11-21 – 2021-11-23 (×3): 20 mg via ORAL
  Filled 2021-11-21 (×3): qty 1

## 2021-11-21 MED ORDER — ACETAMINOPHEN 325 MG PO TABS
650.0000 mg | ORAL_TABLET | Freq: Four times a day (QID) | ORAL | Status: DC | PRN
  Administered 2021-11-22: 650 mg via ORAL
  Filled 2021-11-21: qty 2

## 2021-11-21 NOTE — ED Provider Notes (Signed)
[facility] EMERGENCY DEPARTMENT Provider Note   CSN: [id number] Arrival date & time: 11/21/21  8786     History Chief Complaint  Patient presents with   Dizziness    Raymond Walker. is a 70 y.o. male.  HPI 70 year old male presents with lightheadedness and fall.  The patient states that he has been feeling lightheaded for a couple days.  He is not been eating and drinking well.  He denies any fevers or new cough or shortness of breath beyond his chronic dyspnea.  However he has been having abdominal pain and difficulty with urinating.  He has some dysuria and is not urinating very much.  He last urinated yesterday.  Today he was lightheaded and fell into a stand.  He hit his chin and hit his head.  Denies any headache or facial pain.  No room spinning sensation.  Past Medical History:  Diagnosis Date   Caregiver with fatigue 05/02/2020   COPD (chronic obstructive pulmonary disease) (HCC)    Deformity    equinus and metatarsal   Erectile dysfunction    Erectile dysfunction 05/02/2020   Full dentures    History of kidney stones    History of seizures 05/31/2020   Between ages 47 and 80   Hypertension    Impaired glucose tolerance    Pancreatitis, recurrent    Pneumonia    Thrombocytopenia ([place]) 08/10/2018   Staying in the low 100s will follow closely    Patient Active Problem List   Diagnosis Date Noted   Low back pain 09/19/2021   Cognitive dysfunction 06/23/2020   History of seizures 05/31/2020   Erectile dysfunction 05/02/2020   Caregiver with fatigue 05/02/2020   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[place])    Goals of care, counseling/discussion    Palliative care by specialist    DNR (do not resuscitate)    Vitamin D deficiency 01/24/2020   Sepsis ([place]) 01/23/2020   Altered mental status    Hypokalemia    History of kidney stones    Hematuria    UTI (urinary tract infection)    CHRONIC Bladder outlet obstruction    PAD (peripheral artery disease) ([place]) 09/15/2019    Protein-calorie malnutrition, severe 08/21/2019   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[place])    Cellulitis of right lower extremity    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/19/2019   Impaired glucose tolerance    COPD (chronic obstructive pulmonary disease) (HCC)    Thrombocytopenia ([place]) 08/10/2018   Aortic atherosclerosis ([place]) 10/14/2016   Coronary artery calcification seen on CAT scan 10/14/2016   Other emphysema ([place]) 10/14/2016   Elevated blood pressure 12/03/2011   Chest pain 12/03/2011   Tobacco abuse 12/03/2011   Right leg pain 12/03/2011    Past Surgical History:  Procedure Laterality Date   ABDOMINAL AORTOGRAM W/LOWER EXTREMITY Bilateral 08/20/2019   Procedure: ABDOMINAL AORTOGRAM W/LOWER EXTREMITY;  Surgeon: Marty Heck, MD;  Location: [place] [facility];  Service: Cardiovascular;  Laterality: Bilateral;   ACHILLES TENDON SURGERY Right 08/26/2020   Procedure: ACHILLES TENDON LENGTHENING;  Surgeon: Criselda Peaches, DPM;  Location: [place];  Service: Podiatry;  Laterality: Right;   AMPUTATION Right 09/15/2019   Procedure: AMPUTATION RIGHT TOES One, Two, And Three;  Surgeon: Waynetta Sandy, MD;  Location: [place];  Service: Vascular;  Laterality: Right;   APPENDECTOMY     CATARACT EXTRACTION W/PHACO Right 05/02/2018   Procedure: CATARACT EXTRACTION PHACO AND INTRAOCULAR LENS PLACEMENT ([place]);  Surgeon: Baruch Goldmann, MD;  Location: [facility];  Service: Ophthalmology;  Laterality: Right;  CDE: 11.11   CATARACT EXTRACTION W/PHACO Left 07/04/2018   Procedure: CATARACT EXTRACTION PHACO AND INTRAOCULAR LENS PLACEMENT (IOC);  Surgeon: Baruch Goldmann, MD;  Location: [facility];  Service: Ophthalmology;  Laterality: Left;  CDE: 7.15   CHOLECYSTECTOMY     FEMORAL-POPLITEAL BYPASS GRAFT Right 09/15/2019   Procedure: Right BYPASS GRAFT FEMORAL to Above Knee POPLITEAL ARTERY;  Surgeon: Waynetta Sandy, MD;  Location: [place];  Service: Vascular;  Laterality: Right;    FEMORAL-POPLITEAL BYPASS GRAFT Right 01/26/2020   Procedure: IRRIGATION AND DEBRIDEMENT RIGHT FEMORAL POPLITEAL BYPASS SITE;  Surgeon: Waynetta Sandy, MD;  Location: [place];  Service: Vascular;  Laterality: Right;   MULTIPLE TOOTH EXTRACTIONS     ORCHIECTOMY     PERIPHERAL VASCULAR INTERVENTION Left 08/20/2019   Procedure: PERIPHERAL VASCULAR INTERVENTION;  Surgeon: Marty Heck, MD;  Location: [place] [facility];  Service: Cardiovascular;  Laterality: Left;  common/external iliac   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   Procedure: TRANSMETATARSAL AMPUTATION;  Surgeon: Waynetta Sandy, MD;  Location: [place];  Service: Vascular;  Laterality: Right;   TRANSMETATARSAL AMPUTATION Right 08/26/2020   Procedure: TRANSMETATARSAL AMPUTATION REVISION;  Surgeon: Criselda Peaches, DPM;  Location: [place];  Service: Podiatry;  Laterality: Right;   VASECTOMY     WOUND DEBRIDEMENT Right 08/26/2020   Procedure: EXCISION WOUND;  Surgeon: Criselda Peaches, DPM;  Location: [place];  Service: Podiatry;  Laterality: Right;       Family History  Problem Relation Age of Onset   Hypertension Father    Coronary artery disease Father    Coronary artery disease Mother    Cancer Mother        Breast    Social History   Tobacco Use   Smoking status: Former    Packs/day: 1.00    Years: 30.00    Pack years: 30.00    Types: Cigarettes   Smokeless tobacco: Former    Types: Nurse, children's Use: Never used  Substance Use Topics   Alcohol use: No   Drug use: No    Home Medications Prior to Admission medications   Medication Sig Start Date End Date Taking? Authorizing Provider  albuterol (VENTOLIN HFA) 108 (90 Base) MCG/ACT inhaler INHALE 2 PUFFS INTO THE LUNGS EVERY 4 HOURS AS NEEDED FOR WHEEZING OR SHORTNESS OF BREATH Patient not taking: Reported on 09/20/2021 08/09/21   [name], MD  clopidogrel (PLAVIX) 75 MG tablet TAKE 1 TABLET(75 MG) BY MOUTH DAILY WITH  BREAKFAST 09/20/21   Luking, Elayne Snare, MD  doxycycline (VIBRA-TABS) 100 MG tablet Take 1 tablet (100 mg total) by mouth 2 (two) times daily. Patient not taking: Reported on 11/03/2021 09/20/21   [name], MD  mupirocin ointment (BACTROBAN) 2 % Apply to affected area daily as needed Patient not taking: Reported on 11/03/2021 09/21/21   [name], MD  pantoprazole (PROTONIX) 40 MG tablet TAKE 1 TABLET(40 MG) BY MOUTH DAILY 07/26/21   [name], MD  rosuvastatin (CRESTOR) 20 MG tablet TAKE 1 TABLET(20 MG) BY MOUTH DAILY 07/26/21   [name], MD  sildenafil (VIAGRA) 100 MG tablet Take 0.5-1 tablets (50-100 mg total) by mouth daily as needed for erectile dysfunction. Patient not taking: Reported on 11/03/2021 05/29/21   [name], MD  sucralfate (CARAFATE) 1 g tablet TAKE 1 TABLET(1 GRAM) BY MOUTH FOUR TIMES DAILY AT BEDTIME WITH MEALS  10/04/21   [name], MD  traMADol (ULTRAM) 50 MG tablet TAKE 1 TABLET(50 MG) BY MOUTH DAILY AS NEEDED FOR MODERATE PAIN Patient not taking: Reported on 11/03/2021 08/03/21   [name], MD    Allergies    Patient has no known allergies.  Review of Systems   Review of Systems  Constitutional:  Negative for fever.  Respiratory:  Positive for shortness of breath. Negative for cough.   Cardiovascular:  Negative for chest pain.  Gastrointestinal:  Positive for abdominal pain.  Genitourinary:  Positive for dysuria.  Neurological:  Positive for light-headedness. Negative for headaches.  All other systems reviewed and are negative.  Physical Exam Updated Vital Signs BP (!) 142/116   Pulse 92   Temp 98.9 F (37.2 C) (Oral)   Resp (!) 22   Ht 6' (1.829 m)   Wt 69.9 kg   SpO2 99%   BMI 20.89 kg/m   Physical Exam Vitals and nursing note reviewed.  Constitutional:      Appearance: He is well-developed.  HENT:     Head: Normocephalic and atraumatic.     Right Ear: External ear normal.     Left Ear: External ear normal.      Nose: Nose normal.     Mouth/Throat:     Mouth: Mucous membranes are dry.  Eyes:     General:        Right eye: No discharge.        Left eye: No discharge.     Extraocular Movements: Extraocular movements intact.     Pupils: Pupils are equal, round, and reactive to light.  Cardiovascular:     Rate and Rhythm: Regular rhythm. Tachycardia present.     Heart sounds: Normal heart sounds.  Pulmonary:     Effort: Pulmonary effort is normal.     Breath sounds: Wheezing present.  Abdominal:     Palpations: Abdomen is soft.     Tenderness: There is generalized abdominal tenderness.  Musculoskeletal:     Cervical back: Neck supple.  Skin:    General: Skin is warm and dry.  Neurological:     Mental Status: He is alert.     Comments: 5/5 strength in all 4 extremities.  Psychiatric:        Mood and Affect: Mood is not anxious.    ED Results / Procedures / Treatments   Labs (all labs ordered are listed, but only abnormal results are displayed) Labs Reviewed  URINALYSIS, ROUTINE W REFLEX MICROSCOPIC - Abnormal; Notable for the following components:      Result Value   Color, Urine AMBER (*)    APPearance CLOUDY (*)    Hgb urine dipstick LARGE (*)    Protein, ur 100 (*)    Leukocytes,Ua MODERATE (*)    RBC / HPF >50 (*)    WBC, UA >50 (*)    Bacteria, UA RARE (*)    All other components within normal limits  COMPREHENSIVE METABOLIC PANEL - Abnormal; Notable for the following components:   Glucose, Bld 153 (*)    BUN 46 (*)    Creatinine, Ser 1.29 (*)    Calcium 8.6 (*)    Total Protein 5.8 (*)    Albumin 2.7 (*)    GFR, Estimated 59 (*)    All other components within normal limits  CBC WITH DIFFERENTIAL/PLATELET - Abnormal; Notable for the following components:   WBC 14.2 (*)    Platelets 135 (*)  Neutro Abs 11.5 (*)    Lymphs Abs 0.6 (*)    Monocytes Absolute 1.9 (*)    Abs Immature Granulocytes 0.11 (*)    All other components within normal limits  LACTIC ACID,  PLASMA - Abnormal; Notable for the following components:   Lactic Acid, Venous 2.9 (*)    All other components within normal limits  LACTIC ACID, PLASMA - Abnormal; Notable for the following components:   Lactic Acid, Venous 2.4 (*)    All other components within normal limits  TROPONIN I (HIGH SENSITIVITY) - Abnormal; Notable for the following components:   Troponin I (High Sensitivity) 21 (*)    All other components within normal limits  CULTURE, BLOOD (ROUTINE X 2)  CULTURE, BLOOD (ROUTINE X 2)  LIPASE, BLOOD  TROPONIN I (HIGH SENSITIVITY)    EKG EKG Interpretation  Date/Time:  Tuesday November 21 2021 11:40:20 EST Ventricular Rate:  97 PR Interval:  212 QRS Duration: 95 QT Interval:  356 QTC Calculation: 453 R Axis:   88 Text Interpretation: Sinus tachycardia Multiple premature complexes, vent & supraven Borderline right axis deviation Confirmed by Sherwood Gambler [phone]) on 11/21/2021 3:20:13 PM  Radiology DG Chest Portable 1 View  Result Date: 11/21/2021 CLINICAL DATA:  Dyspnea fall, dizziness EXAM: PORTABLE CHEST 1 VIEW COMPARISON:  Chest radiograph 07/26/2021 FINDINGS: The cardiomediastinal silhouette is stable. Coarsened interstitial markings are again seen in both lungs on a background of hyperlucency likely reflecting scarring and COPD. Findings are overall similar to the prior study. There is no new or worsening focal airspace disease. There is no significant pleural effusion. There is no pneumothorax. The bones are stable. IMPRESSION: Stable findings of COPD. No evidence of acute cardiopulmonary process. Electronically Signed   By: Valetta Mole M.D.   On: 11/21/2021 11:20    Procedures .Critical Care Performed by: Sherwood Gambler, MD Authorized by: Sherwood Gambler, MD   Critical care provider statement:    Critical care time (minutes):  35   Critical care time was exclusive of:  Separately billable procedures and treating other patients   Critical care was  necessary to treat or prevent imminent or life-threatening deterioration of the following conditions:  Renal failure and sepsis   Critical care was time spent personally by me on the following activities:  Development of treatment plan with patient or surrogate, discussions with consultants, evaluation of patient's response to treatment, examination of patient, ordering and review of laboratory studies, ordering and review of radiographic studies, ordering and performing treatments and interventions, pulse oximetry, re-evaluation of patient's condition and review of old charts   Medications Ordered in ED Medications  cefTRIAXone (ROCEPHIN) 2 g in sodium chloride 0.9 % 100 mL IVPB (0 g Intravenous Stopped 11/21/21 1244)  albuterol (VENTOLIN HFA) 108 (90 Base) MCG/ACT inhaler 2 puff (2 puffs Inhalation Given 11/21/21 1530)  sodium chloride 0.9 % bolus 1,000 mL (0 mLs Intravenous Stopped 11/21/21 1210)  albuterol (VENTOLIN HFA) 108 (90 Base) MCG/ACT inhaler 2 puff (2 puffs Inhalation Given 11/21/21 1105)  fentaNYL (SUBLIMAZE) injection 50 mcg (50 mcg Intravenous Given 11/21/21 1145)  sodium chloride 0.9 % bolus 1,000 mL (0 mLs Intravenous Stopped 11/21/21 1244)  iohexol (OMNIPAQUE) 300 MG/ML solution 100 mL (100 mLs Intravenous Contrast Given 11/21/21 1409)    ED Course  I have reviewed the triage vital signs and the nursing notes.  Pertinent labs & imaging results that were available during my care of the patient were reviewed by me and considered in my medical decision  making (see chart for details).    MDM Rules/Calculators/A&P                           Patient's work-up shows that he has distal ureteral stones on the right side but without hydronephrosis.  However with the white blood cell count being elevated, sepsis, and UTI, I am concerned about infection.  Discussed with urology, Dr. Alyson Ingles, who recommends that a stent would not be able to pass and so he needs a nephrostomy tube.  This [facility] be  [facility] at [facility].  Urology will follow but he needs to be admitted to the hospitalist service.  Discussed with Dr. Bridgett Larsson.  He was given IV Rocephin for the UTI.  Final Clinical Impression(s) / ED Diagnoses Final diagnoses:  Severe sepsis ([place])  Ureteral stone  Acute urinary tract infection  Acute kidney injury [facility])    Rx / DC Orders ED Discharge Orders     None        Sherwood Gambler, MD 11/21/21 1605

## 2021-11-21 NOTE — ACP (Advance Care Planning) (Signed)
  Advance Care Planning  Reason for Advance Care Planning Conversation: Acute hospitalization Principal Problem:   Kidney stone on right side Active Problems:   Sepsis ([place])   COPD (chronic obstructive pulmonary disease) (HCC)   PAD (peripheral artery disease) ([place])   DNR (do not resuscitate)    I discussed with patient about advance care planning. Specifically, we discussed whether patient would desire cardiopulmonary resuscitation (CPR) in the event of acute cardiopulmonary arrest. We also discussed whether endotracheal intubation and temporary ventilator life support would be desired in the event of acute cardio- or pulmonary decompensation.   Code status order of DNR has been entered in accord with the patient's wishes. no intubation  Living will: yes  [place] / Davonna Belling              Name: [name] royal: Relationship to Patient: neice   Is agent appointed in legal document yes  Time spent today in ACP discussion was  [place], DO [facility]

## 2021-11-21 NOTE — H&P (Signed)
History and Physical    Raymond Walker. KWI:[id number] DOB: October 07, 1951 DOA: 11/21/2021  PCP: [name], MD   Patient coming from: Home  I have personally briefly reviewed patient's old medical records in [facility]  Chief complaint: Fever, chills, inability urinate. HPI: 70 year old male with a history of COPD, peripheral arterial disease, numerous kidney stones in the past presents to the ER today with a 1 week history of fever, chills, inability urinate.  Patient states that he has had 47 kidney stones in the past.  He is never had surgery for them.  He usually gets lithotripsy.  With past week, has noticed his inability urinate.  He has not passed any kidney stones.  He has had increasing fever and chills.  He has had rigors today.  Patient states he stopped smoking a week ago due to bad taste in his mouth.  On arrival, patient was tachycardic with a heart rate of 119.  He had shaking chills.  Labs showed a lactic acid of 2.9.  Serum creatinine increased to 1.29 with a BUN of 46.  White count elevated at 14.2.  Urinalysis demonstrated greater than 50 red cells, greater than 50 white cells, rare bacteria.  Large hemoglobin.  CT showed numerous obstructing kidney stone on the right measuring approximately 1 cm in diameter.  EDP is discussed the case with urology who thinks the patient will need a PERC nephrostomy tube.  Urology is requested patient go to [facility].  [facility] hospitalist contacted for admission.     ED Course: CT abd shows obstructing kidney stone. Sepsis protocol started. IV abx given.  Review of Systems:  Review of Systems  Constitutional:  Positive for chills, fever and malaise/fatigue.  HENT: Negative.  Negative for ear pain, hearing loss and tinnitus.   Eyes: Negative.   Respiratory:  Positive for cough and wheezing. Negative for sputum production.   Cardiovascular: Negative.  Negative for chest pain, palpitations and orthopnea.   Gastrointestinal:  Positive for abdominal pain. Negative for heartburn, nausea and vomiting.  Genitourinary:        +inability to urinate  Musculoskeletal:  Positive for back pain.  Skin: Negative.   Neurological: Negative.  Negative for dizziness and headaches.  Endo/Heme/Allergies: Negative.   Psychiatric/Behavioral: Negative.    All other systems reviewed and are negative.  Past Medical History:  Diagnosis Date   Caregiver with fatigue 05/02/2020   COPD (chronic obstructive pulmonary disease) (HCC)    Deformity    equinus and metatarsal   Erectile dysfunction    Erectile dysfunction 05/02/2020   Full dentures    History of kidney stones    History of seizures 05/31/2020   Between ages 71 and 62   Hypertension    Impaired glucose tolerance    Pancreatitis, recurrent    Pneumonia    Thrombocytopenia ([place]) 08/10/2018   Staying in the low 100s will follow closely    Past Surgical History:  Procedure Laterality Date   ABDOMINAL AORTOGRAM W/LOWER EXTREMITY Bilateral 08/20/2019   Procedure: ABDOMINAL AORTOGRAM W/LOWER EXTREMITY;  Surgeon: Marty Heck, MD;  Location: [place] [facility];  Service: Cardiovascular;  Laterality: Bilateral;   ACHILLES TENDON SURGERY Right 08/26/2020   Procedure: ACHILLES TENDON LENGTHENING;  Surgeon: Criselda Peaches, DPM;  Location: [place];  Service: Podiatry;  Laterality: Right;   AMPUTATION Right 09/15/2019   Procedure: AMPUTATION RIGHT TOES One, Two, And Three;  Surgeon: Waynetta Sandy, MD;  Location: [place];  Service: Vascular;  Laterality: Right;   APPENDECTOMY     CATARACT EXTRACTION W/PHACO Right 05/02/2018   Procedure: CATARACT EXTRACTION PHACO AND INTRAOCULAR LENS PLACEMENT (IOC);  Surgeon: Baruch Goldmann, MD;  Location: [facility];  Service: Ophthalmology;  Laterality: Right;  CDE: 11.11   CATARACT EXTRACTION W/PHACO Left 07/04/2018   Procedure: CATARACT EXTRACTION PHACO AND INTRAOCULAR LENS PLACEMENT (IOC);  Surgeon: Baruch Goldmann,  MD;  Location: [facility];  Service: Ophthalmology;  Laterality: Left;  CDE: 7.15   CHOLECYSTECTOMY     FEMORAL-POPLITEAL BYPASS GRAFT Right 09/15/2019   Procedure: Right BYPASS GRAFT FEMORAL to Above Knee POPLITEAL ARTERY;  Surgeon: Waynetta Sandy, MD;  Location: [place];  Service: Vascular;  Laterality: Right;   FEMORAL-POPLITEAL BYPASS GRAFT Right 01/26/2020   Procedure: IRRIGATION AND DEBRIDEMENT RIGHT FEMORAL POPLITEAL BYPASS SITE;  Surgeon: Waynetta Sandy, MD;  Location: [place];  Service: Vascular;  Laterality: Right;   MULTIPLE TOOTH EXTRACTIONS     ORCHIECTOMY     PERIPHERAL VASCULAR INTERVENTION Left 08/20/2019   Procedure: PERIPHERAL VASCULAR INTERVENTION;  Surgeon: Marty Heck, MD;  Location: [place] [facility];  Service: Cardiovascular;  Laterality: Left;  common/external iliac   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   Procedure: TRANSMETATARSAL AMPUTATION;  Surgeon: Waynetta Sandy, MD;  Location: [place];  Service: Vascular;  Laterality: Right;   TRANSMETATARSAL AMPUTATION Right 08/26/2020   Procedure: TRANSMETATARSAL AMPUTATION REVISION;  Surgeon: Criselda Peaches, DPM;  Location: [place];  Service: Podiatry;  Laterality: Right;   VASECTOMY     WOUND DEBRIDEMENT Right 08/26/2020   Procedure: EXCISION WOUND;  Surgeon: Criselda Peaches, DPM;  Location: [place];  Service: Podiatry;  Laterality: Right;     reports that he has quit smoking. His smoking use included cigarettes. He has a 30.00 pack-year smoking history. He has quit using smokeless tobacco.  His smokeless tobacco use included chew. He reports that he does not drink alcohol and does not use drugs.  No Known Allergies  Family History  Problem Relation Age of Onset   Hypertension Father    Coronary artery disease Father    Coronary artery disease Mother    Cancer Mother        Breast    Prior to Admission medications   Medication Sig Start Date End Date Taking? Authorizing Provider   albuterol (VENTOLIN HFA) 108 (90 Base) MCG/ACT inhaler INHALE 2 PUFFS INTO THE LUNGS EVERY 4 HOURS AS NEEDED FOR WHEEZING OR SHORTNESS OF BREATH Patient not taking: Reported on 09/20/2021 08/09/21   [name], MD  clopidogrel (PLAVIX) 75 MG tablet TAKE 1 TABLET(75 MG) BY MOUTH DAILY WITH BREAKFAST 09/20/21   Luking, Elayne Snare, MD  doxycycline (VIBRA-TABS) 100 MG tablet Take 1 tablet (100 mg total) by mouth 2 (two) times daily. Patient not taking: Reported on 11/03/2021 09/20/21   [name], MD  mupirocin ointment (BACTROBAN) 2 % Apply to affected area daily as needed Patient not taking: Reported on 11/03/2021 09/21/21   [name], MD  pantoprazole (PROTONIX) 40 MG tablet TAKE 1 TABLET(40 MG) BY MOUTH DAILY 07/26/21   [name], MD  rosuvastatin (CRESTOR) 20 MG tablet TAKE 1 TABLET(20 MG) BY MOUTH DAILY 07/26/21   [name], MD  sildenafil (VIAGRA) 100 MG tablet Take 0.5-1 tablets (50-100 mg total) by mouth daily as needed for erectile dysfunction. Patient not taking: Reported on 11/03/2021 05/29/21   [name], MD  sucralfate (CARAFATE) 1 g tablet TAKE 1  TABLET(1 GRAM) BY MOUTH FOUR TIMES DAILY AT BEDTIME WITH MEALS 10/04/21   [name], MD  traMADol (ULTRAM) 50 MG tablet TAKE 1 TABLET(50 MG) BY MOUTH DAILY AS NEEDED FOR MODERATE PAIN Patient not taking: Reported on 11/03/2021 08/03/21   [name], MD    Physical Exam: Vitals:   11/21/21 1500 11/21/21 1530 11/21/21 1600 11/21/21 1615  BP: (!) 147/74 (!) 142/116 (!) 140/92   Pulse: 89 92 (!) 117 (!) 107  Resp: (!) 32 (!) 22    Temp:    (!) 100.4 F (38 C)  TempSrc:    Oral  SpO2: 100% 99% 93% 96%  Weight:      Height:        Physical Exam Vitals and nursing note reviewed.  Constitutional:      General: He is not in acute distress.    Appearance: Normal appearance. He is ill-appearing. He is not toxic-appearing or diaphoretic.  HENT:     Head: Normocephalic and atraumatic.     Nose: Nose  normal. No rhinorrhea.  Eyes:     Pupils: Pupils are equal, round, and reactive to light.  Cardiovascular:     Rate and Rhythm: Regular rhythm. Tachycardia present.     Pulses: Normal pulses.  Pulmonary:     Breath sounds: Examination of the right-upper field reveals wheezing. Examination of the left-upper field reveals wheezing. Examination of the right-middle field reveals wheezing. Examination of the left-middle field reveals wheezing. Wheezing present.  Abdominal:     General: Abdomen is flat. There is no distension.     Palpations: Abdomen is soft.     Tenderness: There is no abdominal tenderness. There is no guarding or rebound.  Musculoskeletal:     Right lower leg: No edema.     Left lower leg: No edema.  Skin:    General: Skin is warm and dry.     Capillary Refill: Capillary refill takes less than 2 seconds.  Neurological:     General: No focal deficit present.     Mental Status: He is alert and oriented to person, place, and time.     Labs on Admission: I have personally reviewed following labs and imaging studies  CBC: Recent Labs  Lab 11/21/21 1039  WBC 14.2*  NEUTROABS 11.5*  HGB 15.1  HCT 45.4  MCV 88.3  PLT 086*   Basic Metabolic Panel: Recent Labs  Lab 11/21/21 1039  NA 138  K 4.7  CL 106  CO2 23  GLUCOSE 153*  BUN 46*  CREATININE 1.29*  CALCIUM 8.6*   GFR: Estimated Creatinine Clearance: 51.9 mL/min (A) (by C-G formula based on SCr of 1.29 mg/dL (H)). Liver Function Tests: Recent Labs  Lab 11/21/21 1039  AST 24  ALT 14  ALKPHOS 46  BILITOT 0.3  PROT 5.8*  ALBUMIN 2.7*   Recent Labs  Lab 11/21/21 1039  LIPASE 24   No results for input(s): AMMONIA in the last 168 hours. Coagulation Profile: No results for input(s): INR, PROTIME in the last 168 hours. Cardiac Enzymes: No results for input(s): CKTOTAL, CKMB, CKMBINDEX, TROPONINI in the last 168 hours. BNP (last 3 results) No results for input(s): PROBNP in the last 8760  hours. HbA1C: No results for input(s): HGBA1C in the last 72 hours. CBG: No results for input(s): GLUCAP in the last 168 hours. Lipid Profile: No results for input(s): CHOL, HDL, LDLCALC, TRIG, CHOLHDL, LDLDIRECT in the last 72 hours. Thyroid Function Tests: No results for input(s):  TSH, T4TOTAL, FREET4, T3FREE, THYROIDAB in the last 72 hours. Anemia Panel: No results for input(s): VITAMINB12, FOLATE, FERRITIN, TIBC, IRON, RETICCTPCT in the last 72 hours. Urine analysis:    Component Value Date/Time   COLORURINE AMBER (A) 11/21/2021 1131   APPEARANCEUR CLOUDY (A) 11/21/2021 1131   LABSPEC 1.019 11/21/2021 1131   PHURINE 5.0 11/21/2021 1131   GLUCOSEU NEGATIVE 11/21/2021 1131   HGBUR LARGE (A) 11/21/2021 1131   BILIRUBINUR NEGATIVE 11/21/2021 1131   BILIRUBINUR + 10/29/2019 1338   KETONESUR NEGATIVE 11/21/2021 1131   PROTEINUR 100 (A) 11/21/2021 1131   NITRITE NEGATIVE 11/21/2021 1131   LEUKOCYTESUR MODERATE (A) 11/21/2021 1131    Radiological Exams on Admission: I have personally reviewed images  CT ABDOMEN AND PELVIS WITH CONTRAST  TECHNIQUE: Multidetector CT imaging of the abdomen and pelvis was performed using the standard protocol following bolus administration of intravenous contrast.  CONTRAST: 172m OMNIPAQUE IOHEXOL 300 MG/ML SOLN  COMPARISON: CT abdomen/pelvis 11/30/2019  FINDINGS: Lower chest: There is severe emphysema in the lung bases with essentially no lung markings in the anterior portion of the left lung base, similar to prior studies. The imaged heart is unremarkable.  Hepatobiliary: The liver is diffusely hypoattenuating likely reflecting fatty infiltration. The gallbladder is surgically absent. There is no biliary ductal dilatation.  Pancreas: The pancreatic body is atrophic, unchanged. There are no focal lesions or contour abnormalities. There is no main pancreatic ductal dilatation or peripancreatic inflammatory change.  Spleen: A few  punctate calcified granulomas are seen in the spleen. The spleen is otherwise unremarkable.  Adrenals/Urinary Tract: The adrenals are unremarkable.  An irregular somewhat thick-walled cyst with calcifications along the posterior wall in the left interpolar region associated with an area of marked cortical scarring measuring approximally 3.2 cm x 2.8 cm is not significantly changed going back to 2018. Additional renal cysts measuring up to 3.8 cm in the left lower pole are again seen, not significantly changed. Numerous nonobstructing calculi are seen in both kidneys measuring up to 8 mm in the left lower pole. There are multiple stones in the distal right ureter measuring up to 1.1 cm proximal to the UVJ, new since 11/30/2019. There is no upstream hydroureteronephrosis. There is no left hydroureteronephrosis. No stones are seen in the left ureter.  The bladder wall is thickened suggesting chronic outlet obstruction due to the enlarged prostate.  Stomach/Bowel: The stomach is unremarkable. There is no evidence of bowel obstruction. There is no abnormal bowel wall thickening or inflammatory change. The appendix is normal.  Vascular/Lymphatic: There is extensive soft and calcified atherosclerotic plaque in the nonaneurysmal abdominal aorta which measures up to 2.6 cm in maximum diameter. A right iliac stent graft is in place. The excluded aneurysmal common iliac artery measures up to 2.0 cm in maximum dimension, unchanged since 2020. There is a presumed right superficial femoral artery stent in place. The artery is occluded just after the bifurcation, new since 11/30/2019 (3-81). The profunda femoral artery is patent to the level imaged. There is a focal dissection in the left common femoral artery, unchanged since 2020 (3-68). The left superficial femoral artery is occluded to the level imaged, unchanged since 2020. The imaged left deep femoral artery is patent.  There is no  abdominal or pelvic lymphadenopathy.  Reproductive: The prostate is enlarged measuring up to 5.7 cm. The seminal vesicles are unremarkable.  Other: There is no ascites or free air.  Musculoskeletal: There is no acute osseous abnormality or aggressive osseous lesion.  IMPRESSION:  1. Multiple distal right ureteral stones measuring up to 1.0 cm without upstream hydroureteronephrosis. 2. Numerous additional nonobstructing bilateral stones as above. 3. Thick-walled left renal cyst with calcifications along the posterior wall, unchanged going back to at least 2018. 4. Occluded right superficial femoral artery graft shortly after the bifurcation, new since 11/30/2019. The deep femoral artery is patent to the level imaged. 5. Occluded left superficial femoral artery to the level imaged, unchanged since 2020. 6. Stable postprocedural changes reflecting right common iliac artery stent graft with unchanged size of the excluded sac measuring 2.0 cm. 7. Bladder wall thickening suggesting chronic outlet obstructing due to the enlarged prostate. 8. Aortic Atherosclerosis (ICD10-I70.0) and severe emphysema (ICD10-J43.9).   CLINICAL DATA: Dizziness  EXAM: CT HEAD WITHOUT CONTRAST  TECHNIQUE: Contiguous axial images were obtained from the base of the skull through the vertex without intravenous contrast.  COMPARISON: Brain MRI 11/06/2019, CT head 01/23/2020  FINDINGS: Brain: There is no evidence of acute intracranial hemorrhage, extra-axial fluid collection, or acute infarct.  There is mild parenchymal volume loss. The ventricles are normal in size. There is no significant burden of white matter microangiopathy.  There is no solid mass lesion. There is no midline shift.  Vascular: There is calcification of the bilateral cavernous ICAs.  Skull: Normal. Negative for fracture or focal lesion.  Sinuses/Orbits: The paranasal sinuses are clear. Bilateral lens implants are in place. The  globes and orbits are otherwise unremarkable.  Other: None.  IMPRESSION: No acute intracranial pathology.   DG Chest Portable 1 View  Result Date: 11/21/2021 CLINICAL DATA:  Dyspnea fall, dizziness EXAM: PORTABLE CHEST 1 VIEW COMPARISON:  Chest radiograph 07/26/2021 FINDINGS: The cardiomediastinal silhouette is stable. Coarsened interstitial markings are again seen in both lungs on a background of hyperlucency likely reflecting scarring and COPD. Findings are overall similar to the prior study. There is no new or worsening focal airspace disease. There is no significant pleural effusion. There is no pneumothorax. The bones are stable. IMPRESSION: Stable findings of COPD. No evidence of acute cardiopulmonary process. Electronically Signed   By: Valetta Mole M.D.   On: 11/21/2021 11:20    EKG: I have personally reviewed EKG: sinus tachycardia    Assessment/Plan Principal Problem:   Kidney stone on right side Active Problems:   Sepsis ([place])   COPD (chronic obstructive pulmonary disease) (HCC)   PAD (peripheral artery disease) ([place])   DNR (do not resuscitate)    Kidney stone on right side Admit to [facility], telemetry bed.  EDP is discussed the case with urology.  Patient will likely need a PERC nephrostomy tube.  Continue IV antibiotics.  Continue IV fluids.  As needed morphine for pain control.  Sepsis ([place]) Sepsis criteria met due to tachycardia, chills, leukocytosis.  Blood cultures obtained prior to antibiotic therapy.  Continue Rocephin 2 g daily.  COPD (chronic obstructive pulmonary disease) (HCC) Continue duo nebs.  PAD (peripheral artery disease) ([place]) Patient reportedly on Plavix per EMR.  Patient states that he does not know the names of any medications he takes.  DNR (do not resuscitate) Verified DNR/DNI status with the patient.  DVT prophylaxis: SCDs Code Status: DNR/DNI(Do NOT Intubate) verified with patient Family Communication: no family at bedside   Disposition Plan: return to home in 2-3 days  Consults called: EDP discussed case with urology  Admission status: Inpatient, Telemetry bed   Kristopher Oppenheim, DO [facility] 11/21/2021, 4:30 PM

## 2021-11-21 NOTE — Progress Notes (Addendum)
IR procedure request for right sided nephrostomy tube.   70 y.o. male. Hx of COPD, kidney stones,  chronic bladder outlet obstruction, HTN, DM.  Presented to the ED at [facility] with lighteadness, abdominal pain, difficulty with urination and injuries related to fall. CT abd pelvis from today. BUN 46, Cr 1.29, GFR 59, troponin 21, Lactic acid 2.4. Patient is on plavix. Found to have a UTI. Per notes from ED. Urology does not think they can pass a stent. Request for IR to placed a right sided nephrostomy tube. Case reviewed by IR  Attending Dr. Wanda Plump [facility]. After looking at the imaging it does not appear that the kidney stones are obstructing and there is no dilation on the right side. There for right sided nephrostomy tube placement would have little to no benefit. This was communicated directly to Team.

## 2021-11-21 NOTE — ED Notes (Signed)
[facility] called to set up transportation at this time. 

## 2021-11-21 NOTE — Assessment & Plan Note (Signed)
Patient reportedly on Plavix per EMR.  Patient states that he does not know the names of any medications he takes.

## 2021-11-21 NOTE — Assessment & Plan Note (Addendum)
Sepsis criteria met due to tachycardia, chills, leukocytosis.  Blood cultures obtained prior to antibiotic therapy.  Continue Rocephin 2 g daily.

## 2021-11-21 NOTE — Assessment & Plan Note (Signed)
Admit to [facility], telemetry bed.  EDP is discussed the case with urology.  Patient will likely need a PERC nephrostomy tube.  Continue IV antibiotics.  Continue IV fluids.  As needed morphine for pain control.

## 2021-11-21 NOTE — Telephone Encounter (Signed)
FYI: Patient has just been admitted to [facility] and niece wanted to make sure you were aware

## 2021-11-21 NOTE — Progress Notes (Signed)
[facility] monitoring code sepsis 

## 2021-11-21 NOTE — ED Triage Notes (Signed)
Pt presents to ED via [facility] for Fall caused by dizziness. Pt states he has been getting dizzy over the past couple of days when he stands and moves.

## 2021-11-21 NOTE — Assessment & Plan Note (Signed)
Verified DNR/DNI status with the patient.

## 2021-11-21 NOTE — Subjective & Objective (Signed)
Chief complaint: Fever, chills, inability urinate. HPI: 70 year old male with a history of COPD, peripheral arterial disease, numerous kidney stones in the past presents to the ER today with a 1 week history of fever, chills, inability urinate.  Patient states that he has had 47 kidney stones in the past.  He is never had surgery for them.  He usually gets lithotripsy.  With past week, has noticed his inability urinate.  He has not passed any kidney stones.  He has had increasing fever and chills.  He has had rigors today.  Patient states he stopped smoking a week ago due to bad taste in his mouth.  On arrival, patient was tachycardic with a heart rate of 119.  He had shaking chills.  Labs showed a lactic acid of 2.9.  Serum creatinine increased to 1.29 with a BUN of 46.  White count elevated at 14.2.  Urinalysis demonstrated greater than 50 red cells, greater than 50 white cells, rare bacteria.  Large hemoglobin.  CT showed numerous obstructing kidney stone on the right measuring approximately 1 cm in diameter.  EDP is discussed the case with urology who thinks the patient will need a PERC nephrostomy tube.  Urology is requested patient go to [facility] long hospital.  [facility] hospitalist contacted for admission.

## 2021-11-21 NOTE — Progress Notes (Signed)
Apparently IR(Mir) and urology(mckenzie) have discussed the patient's case.  Now urology does not want perc nephrostomy tube. Continue with IV ABX.  Will still transfer him to [facility] for urologic evaluation.  Discussed case with Dr. Dwaine Gale with IR. Pt does not have any hydronephrosis on imaging and does not require perc nephrostomy at this point from an IR perspective.  Pt does warrant hospital admission due to his sepsis and likely infected kidney stones. Will continue IVF and IV abx.

## 2021-11-21 NOTE — ED Notes (Signed)
Pt was bladder scanned, 71ml was shown.

## 2021-11-21 NOTE — Telephone Encounter (Signed)
noted 

## 2021-11-21 NOTE — Assessment & Plan Note (Signed)
Continue duo nebs.

## 2021-11-21 NOTE — Progress Notes (Signed)
Blood cultures drawn before antibiotics given 

## 2021-11-22 ENCOUNTER — Inpatient Hospital Stay (HOSPITAL_COMMUNITY): Payer: Medicare Other | Admitting: Anesthesiology

## 2021-11-22 ENCOUNTER — Encounter (HOSPITAL_COMMUNITY)
Admission: EM | Disposition: A | Discharge status: Home / Self Care | Payer: Self-pay | Source: Home / Self Care | Attending: Internal Medicine

## 2021-11-22 ENCOUNTER — Encounter (HOSPITAL_COMMUNITY): Payer: Self-pay | Admitting: Internal Medicine

## 2021-11-22 ENCOUNTER — Inpatient Hospital Stay (HOSPITAL_COMMUNITY): Payer: Medicare Other

## 2021-11-22 HISTORY — PX: CYSTOSCOPY W/ URETERAL STENT PLACEMENT: SHX1429

## 2021-11-22 LAB — COMPREHENSIVE METABOLIC PANEL
ALT: 12 U/L (ref 0–44)
AST: 16 U/L (ref 15–41)
Albumin: 2.9 g/dL — ABNORMAL LOW (ref 3.5–5.0)
Alkaline Phosphatase: 64 U/L (ref 38–126)
Anion gap: 7 (ref 5–15)
BUN: 17 mg/dL (ref 8–23)
CO2: 27 mmol/L (ref 22–32)
Calcium: 8.3 mg/dL — ABNORMAL LOW (ref 8.9–10.3)
Chloride: 104 mmol/L (ref 98–111)
Creatinine, Ser: 0.81 mg/dL (ref 0.61–1.24)
GFR, Estimated: >60 mL/min (ref >60)
Glucose, Bld: 118 mg/dL — ABNORMAL HIGH (ref 70–99)
Potassium: 3.4 mmol/L — ABNORMAL LOW (ref 3.5–5.1)
Sodium: 138 mmol/L (ref 135–145)
Total Bilirubin: 1 mg/dL (ref 0.3–1.2)
Total Protein: 6.3 g/dL — ABNORMAL LOW (ref 6.5–8.1)

## 2021-11-22 LAB — CBC WITH DIFFERENTIAL/PLATELET
Abs Immature Granulocytes: 0.05 10*3/uL (ref 0.00–0.07)
Basophils Absolute: 0 10*3/uL (ref 0.0–0.1)
Basophils Relative: 0 %
Eosinophils Absolute: 0 10*3/uL (ref 0.0–0.5)
Eosinophils Relative: 0 %
HCT: 38.2 % — ABNORMAL LOW (ref 39.0–52.0)
Hemoglobin: 12.6 g/dL — ABNORMAL LOW (ref 13.0–17.0)
Immature Granulocytes: 1 %
Lymphocytes Relative: 9 %
Lymphs Abs: 0.8 10*3/uL (ref 0.7–4.0)
MCH: 28.6 pg (ref 26.0–34.0)
MCHC: 33 g/dL (ref 30.0–36.0)
MCV: 86.8 fL (ref 80.0–100.0)
Monocytes Absolute: 1.3 10*3/uL — ABNORMAL HIGH (ref 0.1–1.0)
Monocytes Relative: 15 %
Neutro Abs: 6.5 10*3/uL (ref 1.7–7.7)
Neutrophils Relative %: 75 %
Platelets: 116 10*3/uL — ABNORMAL LOW (ref 150–400)
RBC: 4.4 MIL/uL (ref 4.22–5.81)
RDW: 14.4 % (ref 11.5–15.5)
WBC: 8.6 10*3/uL (ref 4.0–10.5)
nRBC: 0 % (ref 0.0–0.2)

## 2021-11-22 LAB — SURGICAL PCR SCREEN
MRSA, PCR: NEGATIVE
Staphylococcus aureus: NEGATIVE

## 2021-11-22 LAB — MAGNESIUM: Magnesium: 1.7 mg/dL (ref 1.7–2.4)

## 2021-11-22 SURGERY — CYSTOSCOPY, WITH RETROGRADE PYELOGRAM AND URETERAL STENT INSERTION
Anesthesia: General | Site: Ureter | Laterality: Right

## 2021-11-22 MED ORDER — FENTANYL CITRATE (PF) 100 MCG/2ML IJ SOLN
INTRAMUSCULAR | Status: AC
  Filled 2021-11-22: qty 2

## 2021-11-22 MED ORDER — FENTANYL CITRATE PF 50 MCG/ML IJ SOSY: 25.0000 ug | PREFILLED_SYRINGE | INTRAMUSCULAR | Status: DC | PRN

## 2021-11-22 MED ORDER — ONDANSETRON HCL 4 MG/2ML IJ SOLN
INTRAMUSCULAR | Status: DC | PRN
  Administered 2021-11-22: 4 mg via INTRAVENOUS

## 2021-11-22 MED ORDER — LACTATED RINGERS IV SOLN: INTRAVENOUS | Status: DC

## 2021-11-22 MED ORDER — IPRATROPIUM-ALBUTEROL 0.5-2.5 (3) MG/3ML IN SOLN: 3.0000 mL | RESPIRATORY_TRACT | Status: DC | PRN

## 2021-11-22 MED ORDER — ENOXAPARIN SODIUM 40 MG/0.4ML IJ SOSY
40.0000 mg | PREFILLED_SYRINGE | INTRAMUSCULAR | Status: DC
  Filled 2021-11-22: qty 0.4

## 2021-11-22 MED ORDER — DEXAMETHASONE SODIUM PHOSPHATE 10 MG/ML IJ SOLN
INTRAMUSCULAR | Status: DC | PRN
  Administered 2021-11-22: 5 mg via INTRAVENOUS

## 2021-11-22 MED ORDER — MOMETASONE FURO-FORMOTEROL FUM 200-5 MCG/ACT IN AERO
2.0000 | INHALATION_SPRAY | Freq: Two times a day (BID) | RESPIRATORY_TRACT | Status: DC
  Administered 2021-11-23: 2 via RESPIRATORY_TRACT
  Filled 2021-11-22: qty 8.8

## 2021-11-22 MED ORDER — IPRATROPIUM-ALBUTEROL 0.5-2.5 (3) MG/3ML IN SOLN
3.0000 mL | Freq: Four times a day (QID) | RESPIRATORY_TRACT | Status: DC
Start: 2021-11-22 — End: 2021-11-23
  Administered 2021-11-23: 3 mL via RESPIRATORY_TRACT
  Filled 2021-11-22: qty 3

## 2021-11-22 MED ORDER — FENTANYL CITRATE (PF) 100 MCG/2ML IJ SOLN
INTRAMUSCULAR | Status: DC | PRN
  Administered 2021-11-22 (×2): 50 ug via INTRAVENOUS

## 2021-11-22 MED ORDER — CLOPIDOGREL BISULFATE 75 MG PO TABS
75.0000 mg | ORAL_TABLET | Freq: Every day | ORAL | Status: DC
  Administered 2021-11-23: 75 mg via ORAL
  Filled 2021-11-22: qty 1

## 2021-11-22 MED ORDER — STERILE WATER FOR IRRIGATION IR SOLN
Status: DC | PRN
  Administered 2021-11-22: 3000 mL

## 2021-11-22 MED ORDER — PROPOFOL 10 MG/ML IV BOLUS
INTRAVENOUS | Status: AC
  Filled 2021-11-22: qty 20

## 2021-11-22 MED ORDER — PROPOFOL 10 MG/ML IV BOLUS
INTRAVENOUS | Status: DC | PRN
  Administered 2021-11-22: 40 mg via INTRAVENOUS
  Administered 2021-11-22: 100 mg via INTRAVENOUS

## 2021-11-22 MED ORDER — PHENYLEPHRINE 40 MCG/ML (10ML) SYRINGE FOR IV PUSH (FOR BLOOD PRESSURE SUPPORT)
PREFILLED_SYRINGE | INTRAVENOUS | Status: DC | PRN
  Administered 2021-11-22: 200 ug via INTRAVENOUS

## 2021-11-22 MED ORDER — LIDOCAINE 2% (20 MG/ML) 5 ML SYRINGE
INTRAMUSCULAR | Status: DC | PRN
  Administered 2021-11-22: 60 mg via INTRAVENOUS

## 2021-11-22 MED ORDER — TRAMADOL HCL 50 MG PO TABS
50.0000 mg | ORAL_TABLET | Freq: Once | ORAL | Status: AC
  Administered 2021-11-22: 50 mg via ORAL
  Filled 2021-11-22: qty 1

## 2021-11-22 MED ORDER — IOHEXOL 300 MG/ML  SOLN
INTRAMUSCULAR | Status: DC | PRN
  Administered 2021-11-22: 10 mL via URETHRAL

## 2021-11-22 SURGICAL SUPPLY — 13 items
BAG COUNTER SPONGE SURGICOUNT (BAG) IMPLANT
BAG URO CATCHER STRL LF (MISCELLANEOUS) ×2 IMPLANT
CATH URETL OPEN END 6FR 70 (CATHETERS) IMPLANT
CLOTH BEACON ORANGE TIMEOUT ST (SAFETY) ×2 IMPLANT
GLOVE SURG ENC TEXT LTX SZ8 (GLOVE) ×2 IMPLANT
GOWN STRL REUS W/TWL XL LVL3 (GOWN DISPOSABLE) ×2 IMPLANT
GUIDEWIRE ANG ZIPWIRE 038X150 (WIRE) IMPLANT
GUIDEWIRE STR DUAL SENSOR (WIRE) ×2 IMPLANT
KIT TURNOVER KIT A (KITS) IMPLANT
MANIFOLD NEPTUNE II (INSTRUMENTS) ×2 IMPLANT
PACK CYSTO (CUSTOM PROCEDURE TRAY) ×2 IMPLANT
STENT CONTOUR 6FRX26X.038 (STENTS) ×2 IMPLANT
TUBING CONNECTING 10 (TUBING) ×2 IMPLANT

## 2021-11-22 NOTE — Op Note (Signed)
Preoperative diagnosis: Urinary tract infection, multiple right distal ureteral calculi  Postoperative diagnosis: Same  Principal procedure: Cystoscopy, right retrograde ureteropyelogram, fluoroscopic interpretation, placement of 6 French by 24 cm contour double-J stent without tether  Surgeon: Alyna Stensland  Anesthesia: General with LMA  Estimated blood loss: None  Complications: None  Drains: None  Indications: 70 year old male recently transferred to [facility] for management of urinary tract infection, resultant sepsis.  He has been found to have right distal ureteral calculi.  Urology has been consulted and we have recommended placement of double-J stent to be followed by eventual ureteroscopic procedure after management of infection.  We have discussed the procedure with the patient, risks, complications and expected outcomes.  He understands and desires to proceed.  Findings: Urethra was normal.  Prostate was minimally obstructive.  He did have an enlarged median lobe.  Bladder was entered and inspected circumferentially.  Urothelium displayed findings of cystitis cystica.  No discrete urothelial tumors were noted.  Multiple moderate trabeculation.  Ureteral orifices were normal in location and configuration.  Upon retrograde study of the right ureter, multiple filling defects consistent with calculi were seen of the right distal ureter with mild dilatation of the ureter and pyelocaliectasis.  I did not see significant filling defects within the pyelocalyceal system.  Description of procedure: The patient was properly identified, marked, and taken to the operating room where general anesthetic was administered with the LMA.  He was placed in the dorsolithotomy position.  Genitalia and perineum were prepped, draped and proper timeout performed.  21 French panendoscope advanced under direct vision with the above-mentioned findings noted.  Once the bladder was reached, systematic  inspection was performed.  The right ureteral orifice was identified, cannulated with a 6 [place] open-ended catheter.  Utilizing Omnipaque, retrograde ureteropyelogram performed.  Filling defects, multiple, present in the right distal ureter consistent with the known ureteral calculi.  Following imaging, I negotiated a sensor tip guidewire through the open-ended catheter, easily reaching the upper pole calyceal system.  Once there, the open-ended catheter was removed and I placed a 6 French, 26 cm contour double-J stent with a tether removed.  Once adequately positioned it was deployed with the sensor tip guidewire being removed.  Excellent proximal and distal curls were seen.  The bladder was drained.  The scope removed, and the procedure terminated.  At this point, the patient was awakened, taken to the PACU in stable condition, having tolerated the procedure well.

## 2021-11-22 NOTE — Consult Note (Addendum)
Urology Consult  Referring physician: Dr. Regenia Skeeter Reason for referral: right ureteral calculus  Chief Complaint: Abdominal pain   History of Present Illness: Raymond Walker is a 70yo with a history of nephrolithiasis who presented to [facility] 12/6 with a 4 day history of diffuse abdominal pain, inability to urinate with nausea and vomiting. His initial work up there was notable for lactate 2.9, WBC count 14.2, Cr 0.9 and UA with moderate leukocytes, negative nitrities, pyruria and rare bacteria. CT A/P shows collection of right distal ureteral stones up to ~1cm. He was seen by Dr. Alyson Ingles at [facility] with plans to transfer to [facility] for possible R nephrostomy tube placement.  Since last evaluated by urology, patient remains hemodynamically stable and afebrile. His lactate has improved to 2.0, WBC down to 8.6 receiving rocephin for antibiotics. He reports that his abdominal pain has improved since yesterday, only complaint today is that he is very hungry. Reports extensive prior history of nephrolithiasis, has had many procedures prior, cannot recall when his last procedure was.   Patient is on plavix, states he has not taken this "in a few days" Last meal several days ago  Reports no issues with anesthesia in the past.    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   Between ages 22 and 16   Hypertension    Impaired glucose tolerance    Pancreatitis, recurrent    Pneumonia    Thrombocytopenia ([place]) 08/10/2018   Staying in the low 100s will follow closely   Past Surgical History:  Procedure Laterality Date   ABDOMINAL AORTOGRAM W/LOWER EXTREMITY Bilateral 08/20/2019   Procedure: ABDOMINAL AORTOGRAM W/LOWER EXTREMITY;  Surgeon:  Marty Heck, MD;  Location: [place] [facility];  Service: Cardiovascular;  Laterality: Bilateral;   ACHILLES TENDON SURGERY Right 08/26/2020   Procedure: ACHILLES TENDON LENGTHENING;  Surgeon: Criselda Peaches, DPM;  Location: [place];  Service: Podiatry;  Laterality: Right;   AMPUTATION Right 09/15/2019   Procedure: AMPUTATION RIGHT TOES One, Two, And Three;  Surgeon: Waynetta Sandy, MD;  Location: [place];  Service: Vascular;  Laterality: Right;   APPENDECTOMY     CATARACT EXTRACTION W/PHACO Right 05/02/2018   Procedure: CATARACT EXTRACTION PHACO AND INTRAOCULAR LENS PLACEMENT ([place]);  Surgeon: Baruch Goldmann, MD;  Location: [facility];  Service: Ophthalmology;  Laterality: Right;  CDE: 11.11   CATARACT EXTRACTION W/PHACO Left 07/04/2018   Procedure: CATARACT EXTRACTION PHACO AND INTRAOCULAR LENS PLACEMENT (IOC);  Surgeon: Baruch Goldmann, MD;  Location: [facility];  Service: Ophthalmology;  Laterality: Left;  CDE: 7.15   CHOLECYSTECTOMY     FEMORAL-POPLITEAL BYPASS GRAFT Right 09/15/2019   Procedure: Right BYPASS GRAFT FEMORAL to Above Knee POPLITEAL ARTERY;  Surgeon: Waynetta Sandy, MD;  Location: [place];  Service: Vascular;  Laterality: Right;   FEMORAL-POPLITEAL BYPASS GRAFT Right 01/26/2020   Procedure: IRRIGATION AND DEBRIDEMENT RIGHT FEMORAL POPLITEAL BYPASS SITE;  Surgeon: Waynetta Sandy, MD;  Location: [place];  Service: Vascular;  Laterality: Right;   MULTIPLE TOOTH EXTRACTIONS     ORCHIECTOMY     PERIPHERAL VASCULAR INTERVENTION Left 08/20/2019   Procedure: PERIPHERAL VASCULAR INTERVENTION;  Surgeon: Marty Heck, MD;  Location: [facility];  Service: Cardiovascular;  Laterality: Left;  common/external iliac   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 Allergies: No Known Allergies  Family History  Problem Relation Age of Onset   Hypertension Father    Coronary artery disease Father    Coronary artery disease Mother    Cancer Mother        Breast   Social History:  reports that he has quit smoking. His smoking use included cigarettes. He has a 30.00 pack-year smoking history. He has quit using smokeless tobacco.  His smokeless tobacco use included chew. He reports that he does not drink alcohol and does not use drugs.  Review of Systems  Physical Exam:  Vital signs in last 24 hours: Temp:  [98.4 F (36.9 C)-99.2 F (37.3 C)] 98.6 F (37 C) (12/07 1454) Pulse Rate:  [74-118] 79 (12/07 1454) Resp:  [16-25] 18 (12/07 0850) BP: (94-137)/(54-105) 136/65 (12/07 1454) SpO2:  [93 %-100 %] 97 % (12/07 1454) Weight:  [67.2 kg] 67.2 kg (12/07 0437) Physical Exam  Laboratory Data:  Results for orders placed or performed during the hospital encounter of 11/21/21 (from the past 72 hour(s))  Comprehensive metabolic panel     Status: Abnormal   Collection Time: 11/21/21 10:39 AM  Result Value Ref Range   Sodium 138 135 - 145 mmol/L   Potassium 4.7 3.5 - 5.1 mmol/L   Chloride 106 98 - 111 mmol/L   CO2 23 22 - 32 mmol/L   Glucose, Bld 153 (H) 70 - 99 mg/dL    Comment: Glucose reference range applies only to samples taken after fasting for at least 8 hours.   BUN 46 (H) 8 - 23 mg/dL   Creatinine, Ser 1.29 (H) 0.61 - 1.24 mg/dL   Calcium 8.6 (L) 8.9 - 10.3 mg/dL   Total Protein 5.8 (L) 6.5 - 8.1 g/dL   Albumin 2.7 (L) 3.5 - 5.0 g/dL   AST 24 15 - 41 U/L   ALT 14 0 - 44 U/L    Alkaline Phosphatase 46 38 - 126 U/L   Total Bilirubin 0.3 0.3 - 1.2 mg/dL   GFR, Estimated 59 (L) >60 mL/min    Comment: (NOTE) Calculated using the CKD-EPI Creatinine Equation (2021)    Anion gap 9 5 - 15    Comment: Performed at [facility], [address] [place], [place] [address]  Lipase, blood     Status: None   Collection Time: 11/21/21 10:39 AM  Result Value Ref Range   Lipase 24 11 - 51 U/L    Comment: Performed at [facility], [address] [place], [place] [address]  CBC with Differential     Status: Abnormal   Collection Time: 11/21/21 10:39 AM  Result Value Ref Range   WBC 14.2 (H) 4.0 - 10.5 K/uL   RBC 5.14 4.22 - 5.81 MIL/uL   Hemoglobin 15.1 13.0 - 17.0 g/dL   HCT 45.4 39.0 - 52.0 %  MCV 88.3 80.0 - 100.0 fL   MCH 29.4 26.0 - 34.0 pg   MCHC 33.3 30.0 - 36.0 g/dL   RDW 14.3 11.5 - 15.5 %   Platelets 135 (L) 150 - 400 K/uL   nRBC 0.0 0.0 - 0.2 %   Neutrophils Relative % 81 %   Neutro Abs 11.5 (H) 1.7 - 7.7 K/uL   Lymphocytes Relative 4 %   Lymphs Abs 0.6 (L) 0.7 - 4.0 K/uL   Monocytes Relative 14 %   Monocytes Absolute 1.9 (H) 0.1 - 1.0 K/uL   Eosinophils Relative 0 %   Eosinophils Absolute 0.0 0.0 - 0.5 K/uL   Basophils Relative 0 %   Basophils Absolute 0.1 0.0 - 0.1 K/uL   Immature Granulocytes 1 %   Abs Immature Granulocytes 0.11 (H) 0.00 - 0.07 K/uL    Comment: Performed at [facility], [address], [place], [place] [address]  Lactic acid, plasma     Status: Abnormal   Collection Time: 11/21/21 10:39 AM  Result Value Ref Range   Lactic Acid, Venous 2.9 (HH) 0.5 - 1.9 mmol/L    Comment: CRITICAL RESULT CALLED TO, READ BACK BY AND VERIFIED WITH: CRAWFORD,H AT 11:50AM ON 11/21/21 BY [facility] Performed at [facility], [address], [place], [place] [address]   Troponin I (High Sensitivity)     Status: None   Collection Time: 11/21/21 10:39 AM  Result Value Ref Range   Troponin I (High Sensitivity) 11 <18 ng/L    Comment: (NOTE) Elevated  high sensitivity troponin I (hsTnI) values and significant  changes across serial measurements may suggest ACS but many other  chronic and acute conditions are known to elevate hsTnI results.  Refer to the "Links" section for chest pain algorithms and additional  guidance. Performed at [facility], [address] [place], [place] [address]   Urinalysis, Routine w reflex microscopic Urine, Clean Catch     Status: Abnormal   Collection Time: 11/21/21 11:31 AM  Result Value Ref Range   Color, Urine AMBER (A) YELLOW    Comment: BIOCHEMICALS MAY BE AFFECTED BY COLOR   APPearance CLOUDY (A) CLEAR   Specific Gravity, Urine 1.019 1.005 - 1.030   pH 5.0 5.0 - 8.0   Glucose, UA NEGATIVE NEGATIVE mg/dL   Hgb urine dipstick LARGE (A) NEGATIVE   Bilirubin Urine NEGATIVE NEGATIVE   Ketones, ur NEGATIVE NEGATIVE mg/dL   Protein, ur 100 (A) NEGATIVE mg/dL   Nitrite NEGATIVE NEGATIVE   Leukocytes,Ua MODERATE (A) NEGATIVE   RBC / HPF >50 (H) 0 - 5 RBC/hpf   WBC, UA >50 (H) 0 - 5 WBC/hpf   Bacteria, UA RARE (A) NONE SEEN   Squamous Epithelial / LPF 0-5 0 - 5   WBC Clumps PRESENT    Mucus PRESENT    Budding Yeast PRESENT    Hyaline Casts, UA PRESENT    Uric Acid Crys, UA PRESENT     Comment: Performed at [facility], [address] [place], [place] [address]  Culture, blood (routine x 2)     Status: None (Preliminary result)   Collection Time: 11/21/21 12:00 PM   Specimen: Right Antecubital; Blood  Result Value Ref Range   Specimen Description RIGHT ANTECUBITAL    Special Requests      BOTTLES DRAWN AEROBIC AND ANAEROBIC Blood Culture adequate volume   Culture      NO GROWTH < 24 HOURS Performed at [facility], [address] [place], [place] [address]  Report Status PENDING   Lactic acid, plasma     Status: Abnormal   Collection Time: 11/21/21 12:08 PM  Result Value Ref Range   Lactic Acid, Venous 2.4 (HH) 0.5 - 1.9 mmol/L    Comment: CRITICAL VALUE NOTED.  VALUE IS CONSISTENT WITH  PREVIOUSLY REPORTED AND CALLED VALUE. Performed at [facility], [address], [place], [place] [address]   Troponin I (High Sensitivity)     Status: Abnormal   Collection Time: 11/21/21 12:35 PM  Result Value Ref Range   Troponin I (High Sensitivity) 21 (H) <18 ng/L    Comment: (NOTE) Elevated high sensitivity troponin I (hsTnI) values and significant  changes across serial measurements may suggest ACS but many other  chronic and acute conditions are known to elevate hsTnI results.  Refer to the "Links" section for chest pain algorithms and additional  guidance. Performed at [facility], [address], [place], [place] [address]   Resp Panel by RT-PCR (Flu A&B, Covid) Nasopharyngeal Swab     Status: None   Collection Time: 11/21/21  4:19 PM   Specimen: Nasopharyngeal Swab; Nasopharyngeal(NP) swabs in vial transport medium  Result Value Ref Range   SARS Coronavirus 2 by RT PCR NEGATIVE NEGATIVE    Comment: (NOTE) SARS-CoV-2 target nucleic acids are NOT DETECTED.  The SARS-CoV-2 RNA is generally detectable in upper respiratory specimens during the acute phase of infection. The lowest concentration of SARS-CoV-2 viral copies this assay can detect is 138 copies/mL. A negative result does not preclude SARS-Cov-2 infection and should not be used as the sole basis for treatment or other patient management decisions. A negative result may occur with  improper specimen collection/handling, submission of specimen other than nasopharyngeal swab, presence of viral mutation(s) within the areas targeted by this assay, and inadequate number of viral copies(<138 copies/mL). A negative result must be combined with clinical observations, patient history, and epidemiological information. The expected result is Negative.  Fact Sheet for Patients:  EntrepreneurPulse.com.au  Fact Sheet for Healthcare Providers:  IncredibleEmployment.be  This test is no t yet  approved or cleared by the [place] FDA and  has been authorized for detection and/or diagnosis of SARS-CoV-2 by FDA under an Emergency Use Authorization (EUA). This EUA will remain  in effect (meaning this test can be used) for the duration of the COVID-19 declaration under Section 564(b)(1) of the Act, 21 U.S.C.section 360bbb-3(b)(1), unless the authorization is terminated  or revoked sooner.       Influenza A by PCR NEGATIVE NEGATIVE   Influenza B by PCR NEGATIVE NEGATIVE    Comment: (NOTE) The Xpert Xpress SARS-CoV-2/FLU/RSV plus assay is intended as an aid in the diagnosis of influenza from Nasopharyngeal swab specimens and should not be used as a sole basis for treatment. Nasal washings and aspirates are unacceptable for Xpert Xpress SARS-CoV-2/FLU/RSV testing.  Fact Sheet for Patients: EntrepreneurPulse.com.au  Fact Sheet for Healthcare Providers: IncredibleEmployment.be  This test is not yet approved or cleared by the [place] FDA and has been authorized for detection and/or diagnosis of SARS-CoV-2 by FDA under an Emergency Use Authorization (EUA). This EUA will remain in effect (meaning this test can be used) for the duration of the COVID-19 declaration under Section 564(b)(1) of the Act, 21 U.S.C. section 360bbb-3(b)(1), unless the authorization is terminated or revoked.  Performed at [facility], [address], [place],  [address]   Procalcitonin - Baseline     Status: None   Collection Time: 11/21/21  5:18 PM  Result Value Ref Range   Procalcitonin 1.64 ng/mL    Comment:        Interpretation: PCT > 0.5 ng/mL and <= 2 ng/mL: Systemic infection (sepsis) is possible, but other conditions are known to elevate PCT as well. (NOTE)       Sepsis PCT Algorithm           Lower Respiratory Tract                                      Infection PCT Algorithm    ----------------------------      ----------------------------         PCT < 0.25 ng/mL                PCT < 0.10 ng/mL          Strongly encourage             Strongly discourage   discontinuation of antibiotics    initiation of antibiotics    ----------------------------     -----------------------------       PCT 0.25 - 0.50 ng/mL            PCT 0.10 - 0.25 ng/mL               OR       >80% decrease in PCT            Discourage initiation of                                            antibiotics      Encourage discontinuation           of antibiotics    ----------------------------     -----------------------------         PCT >= 0.50 ng/mL              PCT 0.26 - 0.50 ng/mL                AND       <80% decrease in PCT             Encourage initiation of                                             antibiotics       Encourage continuation           of antibiotics    ----------------------------     -----------------------------        PCT >= 0.50 ng/mL                  PCT > 0.50 ng/mL               AND         increase in PCT                  Strongly encourage                                      initiation of antibiotics    Strongly encourage escalation  of antibiotics                                     -----------------------------                                           PCT <= 0.25 ng/mL                                                 OR                                        > 80% decrease in PCT                                      Discontinue / Do not initiate                                             antibiotics  Performed at [facility], [address] [place], [place] [address]   Lactic acid, plasma     Status: None   Collection Time: 11/21/21  5:18 PM  Result Value Ref Range   Lactic Acid, Venous 1.7 0.5 - 1.9 mmol/L    Comment: Performed at [facility], [address], [place], [place] [address]  Lactic acid, plasma     Status: Abnormal   Collection Time: 11/21/21  6:43 PM   Result Value Ref Range   Lactic Acid, Venous 2.0 (HH) 0.5 - 1.9 mmol/L    Comment: CRITICAL VALUE NOTED.  VALUE IS CONSISTENT WITH PREVIOUSLY REPORTED AND CALLED VALUE. Performed at [facility], [address], [place], [place] [address]   Procalcitonin     Status: None   Collection Time: 11/21/21  9:09 PM  Result Value Ref Range   Procalcitonin 2.04 ng/mL    Comment:        Interpretation: PCT > 2 ng/mL: Systemic infection (sepsis) is likely, unless other causes are known. (NOTE)       Sepsis PCT Algorithm           Lower Respiratory Tract                                      Infection PCT Algorithm    ----------------------------     ----------------------------         PCT < 0.25 ng/mL                PCT < 0.10 ng/mL          Strongly encourage             Strongly discourage   discontinuation of antibiotics    initiation of antibiotics    ----------------------------     -----------------------------       PCT 0.25 - 0.50  ng/mL            PCT 0.10 - 0.25 ng/mL               OR       >80% decrease in PCT            Discourage initiation of                                            antibiotics      Encourage discontinuation           of antibiotics    ----------------------------     -----------------------------         PCT >= 0.50 ng/mL              PCT 0.26 - 0.50 ng/mL               AND       <80% decrease in PCT              Encourage initiation of                                             antibiotics       Encourage continuation           of antibiotics    ----------------------------     -----------------------------        PCT >= 0.50 ng/mL                  PCT > 0.50 ng/mL               AND         increase in PCT                  Strongly encourage                                      initiation of antibiotics    Strongly encourage escalation           of antibiotics                                     -----------------------------                                            PCT <= 0.25 ng/mL                                                 OR                                        > 80% decrease in PCT  Discontinue / Do not initiate                                             antibiotics  Performed at [facility] [address] [place], [place] [address]   CBC with Differential/Platelet     Status: Abnormal   Collection Time: 11/22/21  3:58 AM  Result Value Ref Range   WBC 8.6 4.0 - 10.5 K/uL   RBC 4.40 4.22 - 5.81 MIL/uL   Hemoglobin 12.6 (L) 13.0 - 17.0 g/dL   HCT 38.2 (L) 39.0 - 52.0 %   MCV 86.8 80.0 - 100.0 fL   MCH 28.6 26.0 - 34.0 pg   MCHC 33.0 30.0 - 36.0 g/dL   RDW 14.4 11.5 - 15.5 %   Platelets 116 (L) 150 - 400 K/uL    Comment: SPECIMEN CHECKED FOR CLOTS Immature Platelet Fraction may be clinically indicated, consider ordering this additional test [id number] REPEATED TO VERIFY PLATELET COUNT CONFIRMED BY SMEAR    nRBC 0.0 0.0 - 0.2 %   Neutrophils Relative % 75 %   Neutro Abs 6.5 1.7 - 7.7 K/uL   Lymphocytes Relative 9 %   Lymphs Abs 0.8 0.7 - 4.0 K/uL   Monocytes Relative 15 %   Monocytes Absolute 1.3 (H) 0.1 - 1.0 K/uL   Eosinophils Relative 0 %   Eosinophils Absolute 0.0 0.0 - 0.5 K/uL   Basophils Relative 0 %   Basophils Absolute 0.0 0.0 - 0.1 K/uL   Immature Granulocytes 1 %   Abs Immature Granulocytes 0.05 0.00 - 0.07 K/uL   Giant PLTs PRESENT     Comment: Performed at [facility], [place] [address], [place], [place] [address]  Comprehensive metabolic panel     Status: Abnormal   Collection Time: 11/22/21  3:58 AM  Result Value Ref Range   Sodium 138 135 - 145 mmol/L   Potassium 3.4 (L) 3.5 - 5.1 mmol/L   Chloride 104 98 - 111 mmol/L   CO2 27 22 - 32 mmol/L   Glucose, Bld 118 (H) 70 - 99 mg/dL    Comment: Glucose reference range applies only to samples taken after fasting for at least 8 hours.   BUN 17 8 - 23 mg/dL    Creatinine, Ser 0.81 0.61 - 1.24 mg/dL   Calcium 8.3 (L) 8.9 - 10.3 mg/dL   Total Protein 6.3 (L) 6.5 - 8.1 g/dL   Albumin 2.9 (L) 3.5 - 5.0 g/dL   AST 16 15 - 41 U/L   ALT 12 0 - 44 U/L   Alkaline Phosphatase 64 38 - 126 U/L   Total Bilirubin 1.0 0.3 - 1.2 mg/dL   GFR, Estimated >60 >60 mL/min    Comment: (NOTE) Calculated using the CKD-EPI Creatinine Equation (2021)    Anion gap 7 5 - 15    Comment: Performed at [facility], [place] [address], [place],  [address]  Magnesium     Status: None   Collection Time: 11/22/21  3:58 AM  Result Value Ref Range   Magnesium 1.7 1.7 - 2.4 mg/dL    Comment: Performed at [facility], [place] [address], [place],  [address]   Recent Results (from the past 240 hour(s))  Culture, blood (routine x 2)     Status: None (Preliminary result)   Collection Time:  11/21/21 12:00 PM   Specimen: Right Antecubital; Blood  Result Value Ref Range Status   Specimen Description RIGHT ANTECUBITAL  Final   Special Requests   Final    BOTTLES DRAWN AEROBIC AND ANAEROBIC Blood Culture adequate volume   Culture   Final    NO GROWTH < 24 HOURS Performed at [facility], [address] [place], [place] [address]    Report Status PENDING  Incomplete  Resp Panel by RT-PCR (Flu A&B, Covid) Nasopharyngeal Swab     Status: None   Collection Time: 11/21/21  4:19 PM   Specimen: Nasopharyngeal Swab; Nasopharyngeal(NP) swabs in vial transport medium  Result Value Ref Range Status   SARS Coronavirus 2 by RT PCR NEGATIVE NEGATIVE Final    Comment: (NOTE) SARS-CoV-2 target nucleic acids are NOT DETECTED.  The SARS-CoV-2 RNA is generally detectable in upper respiratory specimens during the acute phase of infection. The lowest concentration of SARS-CoV-2 viral copies this assay can detect is 138 copies/mL. A negative result does not preclude SARS-Cov-2 infection and should not be used as the sole basis for treatment or other  patient management decisions. A negative result may occur with  improper specimen collection/handling, submission of specimen other than nasopharyngeal swab, presence of viral mutation(s) within the areas targeted by this assay, and inadequate number of viral copies(<138 copies/mL). A negative result must be combined with clinical observations, patient history, and epidemiological information. The expected result is Negative.  Fact Sheet for Patients:  EntrepreneurPulse.com.au  Fact Sheet for Healthcare Providers:  IncredibleEmployment.be  This test is no t yet approved or cleared by the [place] FDA and  has been authorized for detection and/or diagnosis of SARS-CoV-2 by FDA under an Emergency Use Authorization (EUA). This EUA will remain  in effect (meaning this test can be used) for the duration of the COVID-19 declaration under Section 564(b)(1) of the Act, 21 U.S.C.section 360bbb-3(b)(1), unless the authorization is terminated  or revoked sooner.       Influenza A by PCR NEGATIVE NEGATIVE Final   Influenza B by PCR NEGATIVE NEGATIVE Final    Comment: (NOTE) The Xpert Xpress SARS-CoV-2/FLU/RSV plus assay is intended as an aid in the diagnosis of influenza from Nasopharyngeal swab specimens and should not be used as a sole basis for treatment. Nasal washings and aspirates are unacceptable for Xpert Xpress SARS-CoV-2/FLU/RSV testing.  Fact Sheet for Patients: EntrepreneurPulse.com.au  Fact Sheet for Healthcare Providers: IncredibleEmployment.be  This test is not yet approved or cleared by the [place] FDA and has been authorized for detection and/or diagnosis of SARS-CoV-2 by FDA under an Emergency Use Authorization (EUA). This EUA will remain in effect (meaning this test can be used) for the duration of the COVID-19 declaration under Section 564(b)(1) of the Act, 21 U.S.C. section  360bbb-3(b)(1), unless the authorization is terminated or revoked.  Performed at [facility], [address], [place], [place] [address]    Creatinine: Recent Labs    11/21/21 1039 11/22/21 0358  CREATININE 1.29* 0.81   Baseline Creatinine: 0.8  Impression/Assessment:  70yo with several distal right ureteral calculus measuring up to ~1cm with UTI  Plan:   ##Right ureteral calculus  -Plan for cystoscopy, right ureteral stent placement tonight. Discussed risks, benefits, and need for second procedure at a later date for definitive stone management. Patient understands and wishes to proceed.  -If unable to place stent due to large stone burden will need right PCN  -On CTX for antibiotics  -Continue broad spectrum antibiotics  until cultures results -Will need definitive stone management at later date  -Discussed all of the above with his HCPOA Niece [name] [phone])   Raymond Walker 11/22/2021, 4:52 PM  I have spoken with the parents, reviewed the patient's clinical course as well as appropriate lab findings and his CT images.  I agree with proceeding with cystoscopy, right retrograde and stent placement.  I agree with the above note.

## 2021-11-22 NOTE — Progress Notes (Signed)
PROGRESS NOTE  Raymond Walker. RXV:[id number] DOB: 1951/03/27 DOA: 11/21/2021 PCP: [name], MD  HPI/Recap of past 40 hours: 70 year old male with a history of COPD, peripheral arterial disease, numerous kidney stones in the past presents to the ER today with a 1 week history of fever, chills, inability urinate. Patient states that he has had 47 kidney stones in the past.  He has never had surgery for them.  He usually gets lithotripsy. Pt has not passed any kidney stones. He has had increasing fever and chills with rigors. In the ED, pt was tachycardic with a heart rate of 119. Labs showed a lactic acid of 2.9.  Serum creatinine increased to 1.29 with a BUN of 46.  White count elevated at 14.2. UA showed mod leukocytes, large hgb, rare bacteria, >50 WBC. CT showed numerous obstructing kidney stone on the right measuring approximately 1 cm in diameter. Urology consulted. Pt admitted for further management.    Today, patient denies any new complaints, still reports dysuria, with some suprapubic abdominal pain.  Patient denies any chest pain, shortness of breath, nausea/vomiting, no current fever/chills.    Assessment/Plan: Principal Problem:   Kidney stone on right side Active Problems:   COPD (chronic obstructive pulmonary disease) (HCC)   PAD (peripheral artery disease) ([place])   Sepsis ([place])   DNR (do not resuscitate)   Sepsis likely 2/2 UTI Noted obstructing right kidney stone (history of numerous kidney stones) On admission, noted tachycardia, leukocytosis Currently afebrile, last temp of 100.4 on 11/21/2021, resolved leukocytosis UA showed mod leukocytes, large hgb, rare bacteria, >50 WBC, urine culture pending collection [facility] x2 pending CT showed numerous obstructing kidney stone on the right measuring approximately 1 cm in diameter Urology consulted, plan for right nephrostomy tube placement Continue IV Rocephin, IV fluids Pain management Monitor closely  AKI Resolved  s/p IV fluids Daily BMP  History of COPD Currently noted to have bilateral wheezing (patient reports daily wheezing, only on albuterol as needed) Current tobacco abuse, about a pack a day Scheduled duo nebs with as needed, started on Dulera, continue home albuterol inhaler as needed as well  History of PAD Continue PTA Plavix  Tobacco abuse Advised to quit-about 1 pack a day Refused nicotine patch     Estimated body mass index is 20.09 kg/m as calculated from the following:   Height as of this encounter: 6' (1.829 m).   Weight as of this encounter: 67.2 kg.     Code Status: DNR  Family Communication: None at bedside  Disposition Plan: Status is: Inpatient  Remains inpatient appropriate because: Level of care     Consultants: Urology  Procedures: None  Antimicrobials: IV Rocephin  DVT prophylaxis: SCD prior to procedure   Objective: Vitals:   11/22/21 0437 11/22/21 0518 11/22/21 0850 11/22/21 1454  BP:  (!) 111/54 137/85 136/65  Pulse:  74 85 79  Resp:  16 18   Temp:  99.2 F (37.3 C) 98.6 F (37 C) 98.6 F (37 C)  TempSrc:  Oral Oral Oral  SpO2:  96% 96% 97%  Weight: 67.2 kg     Height:        Intake/Output Summary (Last 24 hours) at 11/22/2021 1602 Last data filed at 11/22/2021 0500 Gross per 24 hour  Intake 300 ml  Output 425 ml  Net -125 ml   Filed Weights   11/21/21 1004 11/22/21 0437  Weight: 69.9 kg 67.2 kg    Exam: General: NAD  Cardiovascular: S1,  S2 present Respiratory: Diminished breath sounds, with bilateral wheezing noted Abdomen: Soft, nontender, nondistended, bowel sounds present Musculoskeletal: No bilateral pedal edema noted Skin: Normal Psychiatry: Normal mood     Data Reviewed: CBC: Recent Labs  Lab 11/21/21 1039 11/22/21 0358  WBC 14.2* 8.6  NEUTROABS 11.5* 6.5  HGB 15.1 12.6*  HCT 45.4 38.2*  MCV 88.3 86.8  PLT 135* 767*   Basic Metabolic Panel: Recent Labs  Lab 11/21/21 1039 11/22/21 0358  NA  138 138  K 4.7 3.4*  CL 106 104  CO2 23 27  GLUCOSE 153* 118*  BUN 46* 17  CREATININE 1.29* 0.81  CALCIUM 8.6* 8.3*  MG  --  1.7   GFR: Estimated Creatinine Clearance: 79.5 mL/min (by C-G formula based on SCr of 0.81 mg/dL). Liver Function Tests: Recent Labs  Lab 11/21/21 1039 11/22/21 0358  AST 24 16  ALT 14 12  ALKPHOS 46 64  BILITOT 0.3 1.0  PROT 5.8* 6.3*  ALBUMIN 2.7* 2.9*   Recent Labs  Lab 11/21/21 1039  LIPASE 24   No results for input(s): AMMONIA in the last 168 hours. Coagulation Profile: No results for input(s): INR, PROTIME in the last 168 hours. Cardiac Enzymes: No results for input(s): CKTOTAL, CKMB, CKMBINDEX, TROPONINI in the last 168 hours. BNP (last 3 results) No results for input(s): PROBNP in the last 8760 hours. HbA1C: No results for input(s): HGBA1C in the last 72 hours. CBG: No results for input(s): GLUCAP in the last 168 hours. Lipid Profile: No results for input(s): CHOL, HDL, LDLCALC, TRIG, CHOLHDL, LDLDIRECT in the last 72 hours. Thyroid Function Tests: No results for input(s): TSH, T4TOTAL, FREET4, T3FREE, THYROIDAB in the last 72 hours. Anemia Panel: No results for input(s): VITAMINB12, FOLATE, FERRITIN, TIBC, IRON, RETICCTPCT in the last 72 hours. Urine analysis:    Component Value Date/Time   COLORURINE AMBER (A) 11/21/2021 1131   APPEARANCEUR CLOUDY (A) 11/21/2021 1131   LABSPEC 1.019 11/21/2021 1131   PHURINE 5.0 11/21/2021 1131   GLUCOSEU NEGATIVE 11/21/2021 1131   HGBUR LARGE (A) 11/21/2021 1131   BILIRUBINUR NEGATIVE 11/21/2021 1131   BILIRUBINUR + 10/29/2019 1338   KETONESUR NEGATIVE 11/21/2021 1131   PROTEINUR 100 (A) 11/21/2021 1131   NITRITE NEGATIVE 11/21/2021 1131   LEUKOCYTESUR MODERATE (A) 11/21/2021 1131   Sepsis Labs: @LABRCNTIP (procalcitonin:4,lacticidven:4)  ) Recent Results (from the past 240 hour(s))  Culture, blood (routine x 2)     Status: None (Preliminary result)   Collection Time: 11/21/21 12:00  PM   Specimen: Right Antecubital; Blood  Result Value Ref Range Status   Specimen Description RIGHT ANTECUBITAL  Final   Special Requests   Final    BOTTLES DRAWN AEROBIC AND ANAEROBIC Blood Culture adequate volume   Culture   Final    NO GROWTH < 24 HOURS Performed at [facility], [address], [place], [place] [address]    Report Status PENDING  Incomplete  Resp Panel by RT-PCR (Flu A&B, Covid) Nasopharyngeal Swab     Status: None   Collection Time: 11/21/21  4:19 PM   Specimen: Nasopharyngeal Swab; Nasopharyngeal(NP) swabs in vial transport medium  Result Value Ref Range Status   SARS Coronavirus 2 by RT PCR NEGATIVE NEGATIVE Final    Comment: (NOTE) SARS-CoV-2 target nucleic acids are NOT DETECTED.  The SARS-CoV-2 RNA is generally detectable in upper respiratory specimens during the acute phase of infection. The lowest concentration of SARS-CoV-2 viral copies this assay can detect is 138 copies/mL. A negative result  does not preclude SARS-Cov-2 infection and should not be used as the sole basis for treatment or other patient management decisions. A negative result may occur with  improper specimen collection/handling, submission of specimen other than nasopharyngeal swab, presence of viral mutation(s) within the areas targeted by this assay, and inadequate number of viral copies(<138 copies/mL). A negative result must be combined with clinical observations, patient history, and epidemiological information. The expected result is Negative.  Fact Sheet for Patients:  EntrepreneurPulse.com.au  Fact Sheet for Healthcare Providers:  IncredibleEmployment.be  This test is no t yet approved or cleared by the [place] FDA and  has been authorized for detection and/or diagnosis of SARS-CoV-2 by FDA under an Emergency Use Authorization (EUA). This EUA will remain  in effect (meaning this test can be used) for the duration of the COVID-19  declaration under Section 564(b)(1) of the Act, 21 U.S.C.section 360bbb-3(b)(1), unless the authorization is terminated  or revoked sooner.       Influenza A by PCR NEGATIVE NEGATIVE Final   Influenza B by PCR NEGATIVE NEGATIVE Final    Comment: (NOTE) The Xpert Xpress SARS-CoV-2/FLU/RSV plus assay is intended as an aid in the diagnosis of influenza from Nasopharyngeal swab specimens and should not be used as a sole basis for treatment. Nasal washings and aspirates are unacceptable for Xpert Xpress SARS-CoV-2/FLU/RSV testing.  Fact Sheet for Patients: EntrepreneurPulse.com.au  Fact Sheet for Healthcare Providers: IncredibleEmployment.be  This test is not yet approved or cleared by the [place] FDA and has been authorized for detection and/or diagnosis of SARS-CoV-2 by FDA under an Emergency Use Authorization (EUA). This EUA will remain in effect (meaning this test can be used) for the duration of the COVID-19 declaration under Section 564(b)(1) of the Act, 21 U.S.C. section 360bbb-3(b)(1), unless the authorization is terminated or revoked.  Performed at [facility], [address], [place], [place] [address]       Studies: No results found.  Scheduled Meds:  influenza vaccine adjuvanted  0.5 mL Intramuscular Tomorrow-1000   pantoprazole  40 mg Oral Daily   rosuvastatin  20 mg Oral Daily    Continuous Infusions:  cefTRIAXone (ROCEPHIN)  IV 2 g (11/22/21 1308)   lactated ringers 100 mL/hr at 11/22/21 0401     LOS: 1 day     Alma Friendly, MD [facility]  If 7PM-7AM, please contact night-coverage [email or web address] 11/22/2021, 4:02 PM

## 2021-11-22 NOTE — TOC Progression Note (Signed)
Transition of Care (TOC) - Progression Note    Patient Details  Name: Raymond Walker. MRN: [id number] Date of Birth: 1951-01-15  Transition of Care [facility]) CM/SW Contact  Purcell Mouton, RN Phone Number: 11/22/2021, 2:39 PM  Clinical Narrative:    TOC reviewed chart will continue to follow for discharge need.    Expected Discharge Plan: Home/Self Care Barriers to Discharge: No Barriers Identified  Expected Discharge Plan and Services Expected Discharge Plan: Home/Self Care       Living arrangements for the past 2 months: Single Family Home                                       Social Determinants of Health (SDOH) Interventions    Readmission Risk Interventions No flowsheet data found.

## 2021-11-22 NOTE — Consult Note (Addendum)
Urology Consult  Referring physician: Dr. Regenia Skeeter Reason for referral: right ureteral calculus  Chief Complaint: abdominal pain  History of Present Illness: Raymond [place] is a 70yo with a history of nephrolithiasis who presented to [facility] ER with a 4 day history of diffuse abdominal pain, inability to urinate with nausea and vomiting.  The abdominal pain is dull constant mild to moderate and nonraditing. Lactate 2.9. WBC count 14.2. CT shows 1cm right distal ureteral calculus.   Past Medical History:  Diagnosis Date   Caregiver with fatigue 05/02/2020   COPD (chronic obstructive pulmonary disease) (HCC)    Deformity    equinus and metatarsal   Erectile dysfunction    Erectile dysfunction 05/02/2020   Full dentures    History of kidney stones    History of seizures 05/31/2020   Between ages 69 and 61   Hypertension    Impaired glucose tolerance    Pancreatitis, recurrent    Pneumonia    Thrombocytopenia ([place]) 08/10/2018   Staying in the low 100s will follow closely   Past Surgical History:  Procedure Laterality Date   ABDOMINAL AORTOGRAM W/LOWER EXTREMITY Bilateral 08/20/2019   Procedure: ABDOMINAL AORTOGRAM W/LOWER EXTREMITY;  Surgeon: Marty Heck, MD;  Location: [place] [facility];  Service: Cardiovascular;  Laterality: Bilateral;   ACHILLES TENDON SURGERY Right 08/26/2020   Procedure: ACHILLES TENDON LENGTHENING;  Surgeon: Criselda Peaches, DPM;  Location: [place];  Service: Podiatry;  Laterality: Right;   AMPUTATION Right 09/15/2019   Procedure: AMPUTATION RIGHT TOES One, Two, And Three;  Surgeon: Waynetta Sandy, MD;  Location: [place];  Service: Vascular;  Laterality: Right;   APPENDECTOMY     CATARACT EXTRACTION W/PHACO Right 05/02/2018   Procedure: CATARACT EXTRACTION PHACO AND INTRAOCULAR LENS PLACEMENT ([place]);  Surgeon: Baruch Goldmann, MD;  Location: [facility];  Service: Ophthalmology;  Laterality: Right;  CDE: 11.11   CATARACT EXTRACTION W/PHACO Left 07/04/2018    Procedure: CATARACT EXTRACTION PHACO AND INTRAOCULAR LENS PLACEMENT ([facility]);  Surgeon: Baruch Goldmann, MD;  Location: [facility];  Service: Ophthalmology;  Laterality: Left;  CDE: 7.15   CHOLECYSTECTOMY     FEMORAL-POPLITEAL BYPASS GRAFT Right 09/15/2019   Procedure: Right BYPASS GRAFT FEMORAL to Above Knee POPLITEAL ARTERY;  Surgeon: Waynetta Sandy, MD;  Location: [place];  Service: Vascular;  Laterality: Right;   FEMORAL-POPLITEAL BYPASS GRAFT Right 01/26/2020   Procedure: IRRIGATION AND DEBRIDEMENT RIGHT FEMORAL POPLITEAL BYPASS SITE;  Surgeon: Waynetta Sandy, MD;  Location: [place];  Service: Vascular;  Laterality: Right;   MULTIPLE TOOTH EXTRACTIONS     ORCHIECTOMY     PERIPHERAL VASCULAR INTERVENTION Left 08/20/2019   Procedure: PERIPHERAL VASCULAR INTERVENTION;  Surgeon: Marty Heck, MD;  Location: [place] [facility];  Service: Cardiovascular;  Laterality: Left;  common/external iliac   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 Allergies: No Known Allergies  Family History  Problem Relation Age of Onset   Hypertension Father    Coronary artery  disease Father    Coronary artery disease Mother    Cancer Mother        Breast   Social History:  reports that he has quit smoking. His smoking use included cigarettes. He has a 30.00 pack-year smoking history. He has quit using smokeless tobacco.  His smokeless tobacco  use included chew. He reports that he does not drink alcohol and does not use drugs.  Review of Systems  Genitourinary:  Positive for difficulty urinating and flank pain.  All other systems reviewed and are negative.  Physical Exam:  Vital signs in last 24 hours: Temp:  [98.4 F (36.9 C)-100.4 F (38 C)] 98.6 F (37 C) (12/07 0850) Pulse Rate:  [74-118] 85 (12/07 0850) Resp:  [16-32] 18 (12/07 0850) BP: (94-154)/(54-116) 137/85 (12/07 0850) SpO2:  [92 %-100 %] 96 % (12/07 0850) Weight:  [67.2 kg] 67.2 kg (12/07 0437) Physical Exam Vitals reviewed.  Constitutional:      Appearance: Normal appearance.  HENT:     Head: Normocephalic and atraumatic.     Nose: Nose normal. No congestion.     Mouth/Throat:     Mouth: Mucous membranes are dry.  Eyes:     Extraocular Movements: Extraocular movements intact.     Pupils: Pupils are equal, round, and reactive to light.  Cardiovascular:     Rate and Rhythm: Normal rate and regular rhythm.  Pulmonary:     Effort: Pulmonary effort is normal. No respiratory distress.  Abdominal:     General: Abdomen is flat. There is no distension.  Musculoskeletal:        General: No swelling. Normal range of motion.     Cervical back: Normal range of motion and neck supple.  Skin:    General: Skin is warm and dry.  Neurological:     General: No focal deficit present.     Mental Status: He is alert and oriented to person, place, and time.  Psychiatric:        Mood and Affect: Mood normal.        Behavior: Behavior normal.        Thought Content: Thought content normal.        Judgment: Judgment normal.    Laboratory Data:  Results for orders placed or performed during the hospital encounter of 11/21/21 (from the past 72 hour(s))  Comprehensive metabolic panel     Status: Abnormal   Collection Time: 11/21/21 10:39 AM  Result Value Ref Range   Sodium 138 135 - 145 mmol/L   Potassium 4.7 3.5 - 5.1 mmol/L   Chloride 106 98 - 111 mmol/L   CO2  23 22 - 32 mmol/L   Glucose, Bld 153 (H) 70 - 99 mg/dL    Comment: Glucose reference range applies only to samples taken after fasting for at least 8 hours.   BUN 46 (H) 8 - 23 mg/dL   Creatinine, Ser 1.29 (H) 0.61 - 1.24 mg/dL   Calcium 8.6 (L) 8.9 - 10.3 mg/dL   Total Protein 5.8 (L) 6.5 - 8.1 g/dL   Albumin 2.7 (L) 3.5 - 5.0 g/dL   AST 24 15 - 41 U/L   ALT 14 0 - 44 U/L   Alkaline Phosphatase 46 38 - 126 U/L   Total Bilirubin 0.3 0.3 - 1.2 mg/dL   GFR, Estimated 59 (L) >60 mL/min    Comment: (NOTE) Calculated using the CKD-EPI Creatinine Equation (2021)    Anion gap 9 5 - 15  Comment: Performed at [facility], [address], [place], [place] [address]  Lipase, blood     Status: None   Collection Time: 11/21/21 10:39 AM  Result Value Ref Range   Lipase 24 11 - 51 U/L    Comment: Performed at [facility], [address], [place], [place] [address]  CBC with Differential     Status: Abnormal   Collection Time: 11/21/21 10:39 AM  Result Value Ref Range   WBC 14.2 (H) 4.0 - 10.5 K/uL   RBC 5.14 4.22 - 5.81 MIL/uL   Hemoglobin 15.1 13.0 - 17.0 g/dL   HCT 45.4 39.0 - 52.0 %   MCV 88.3 80.0 - 100.0 fL   MCH 29.4 26.0 - 34.0 pg   MCHC 33.3 30.0 - 36.0 g/dL   RDW 14.3 11.5 - 15.5 %   Platelets 135 (L) 150 - 400 K/uL   nRBC 0.0 0.0 - 0.2 %   Neutrophils Relative % 81 %   Neutro Abs 11.5 (H) 1.7 - 7.7 K/uL   Lymphocytes Relative 4 %   Lymphs Abs 0.6 (L) 0.7 - 4.0 K/uL   Monocytes Relative 14 %   Monocytes Absolute 1.9 (H) 0.1 - 1.0 K/uL   Eosinophils Relative 0 %   Eosinophils Absolute 0.0 0.0 - 0.5 K/uL   Basophils Relative 0 %   Basophils Absolute 0.1 0.0 - 0.1 K/uL   Immature Granulocytes 1 %   Abs Immature Granulocytes 0.11 (H) 0.00 - 0.07 K/uL    Comment: Performed at [facility], [address] [place], [place] [address]  Lactic acid, plasma     Status: Abnormal   Collection Time: 11/21/21 10:39 AM  Result Value Ref Range   Lactic Acid, Venous 2.9 (HH) 0.5 - 1.9  mmol/L    Comment: CRITICAL RESULT CALLED TO, READ BACK BY AND VERIFIED WITH: CRAWFORD,H AT 11:50AM ON 11/21/21 BY [facility] Performed at [facility], [address], [place], [place] [address]   Troponin I (High Sensitivity)     Status: None   Collection Time: 11/21/21 10:39 AM  Result Value Ref Range   Troponin I (High Sensitivity) 11 <18 ng/L    Comment: (NOTE) Elevated high sensitivity troponin I (hsTnI) values and significant  changes across serial measurements may suggest ACS but many other  chronic and acute conditions are known to elevate hsTnI results.  Refer to the "Links" section for chest pain algorithms and additional  guidance. Performed at [facility], [address], [place],  [address]   Urinalysis, Routine w reflex microscopic Urine, Clean Catch     Status: Abnormal   Collection Time: 11/21/21 11:31 AM  Result Value Ref Range   Color, Urine AMBER (A) YELLOW    Comment: BIOCHEMICALS MAY BE AFFECTED BY COLOR   APPearance CLOUDY (A) CLEAR   Specific Gravity, Urine 1.019 1.005 - 1.030   pH 5.0 5.0 - 8.0   Glucose, UA NEGATIVE NEGATIVE mg/dL   Hgb urine dipstick LARGE (A) NEGATIVE   Bilirubin Urine NEGATIVE NEGATIVE   Ketones, ur NEGATIVE NEGATIVE mg/dL   Protein, ur 100 (A) NEGATIVE mg/dL   Nitrite NEGATIVE NEGATIVE   Leukocytes,Ua MODERATE (A) NEGATIVE   RBC / HPF >50 (H) 0 - 5 RBC/hpf   WBC, UA >50 (H) 0 - 5 WBC/hpf   Bacteria, UA RARE (A) NONE SEEN   Squamous Epithelial / LPF 0-5 0 - 5   WBC Clumps PRESENT    Mucus PRESENT    Budding Yeast PRESENT  Hyaline Casts, UA PRESENT    Uric Acid Crys, UA PRESENT     Comment: Performed at [facility], [address] [place], [place] [address]  Culture, blood (routine x 2)     Status: None (Preliminary result)   Collection Time: 11/21/21 12:00 PM   Specimen: Right Antecubital; Blood  Result Value Ref Range   Specimen Description RIGHT ANTECUBITAL    Special Requests      BOTTLES DRAWN AEROBIC AND  ANAEROBIC Blood Culture adequate volume   Culture      NO GROWTH < 24 HOURS Performed at [facility], [address] [place], [place] [address]    Report Status PENDING   Lactic acid, plasma     Status: Abnormal   Collection Time: 11/21/21 12:08 PM  Result Value Ref Range   Lactic Acid, Venous 2.4 (HH) 0.5 - 1.9 mmol/L    Comment: CRITICAL VALUE NOTED.  VALUE IS CONSISTENT WITH PREVIOUSLY REPORTED AND CALLED VALUE. Performed at [facility], [address] [place], [place] [address]   Troponin I (High Sensitivity)     Status: Abnormal   Collection Time: 11/21/21 12:35 PM  Result Value Ref Range   Troponin I (High Sensitivity) 21 (H) <18 ng/L    Comment: (NOTE) Elevated high sensitivity troponin I (hsTnI) values and significant  changes across serial measurements may suggest ACS but many other  chronic and acute conditions are known to elevate hsTnI results.  Refer to the "Links" section for chest pain algorithms and additional  guidance. Performed at [facility], [address], [place], [place] [address]   Resp Panel by RT-PCR (Flu A&B, Covid) Nasopharyngeal Swab     Status: None   Collection Time: 11/21/21  4:19 PM   Specimen: Nasopharyngeal Swab; Nasopharyngeal(NP) swabs in vial transport medium  Result Value Ref Range   SARS Coronavirus 2 by RT PCR NEGATIVE NEGATIVE    Comment: (NOTE) SARS-CoV-2 target nucleic acids are NOT DETECTED.  The SARS-CoV-2 RNA is generally detectable in upper respiratory specimens during the acute phase of infection. The lowest concentration of SARS-CoV-2 viral copies this assay can detect is 138 copies/mL. A negative result does not preclude SARS-Cov-2 infection and should not be used as the sole basis for treatment or other patient management decisions. A negative result may occur with  improper specimen collection/handling, submission of specimen other than nasopharyngeal swab, presence of viral mutation(s) within the areas targeted by this  assay, and inadequate number of viral copies(<138 copies/mL). A negative result must be combined with clinical observations, patient history, and epidemiological information. The expected result is Negative.  Fact Sheet for Patients:  EntrepreneurPulse.com.au  Fact Sheet for Healthcare Providers:  IncredibleEmployment.be  This test is no t yet approved or cleared by the [place] FDA and  has been authorized for detection and/or diagnosis of SARS-CoV-2 by FDA under an Emergency Use Authorization (EUA). This EUA will remain  in effect (meaning this test can be used) for the duration of the COVID-19 declaration under Section 564(b)(1) of the Act, 21 U.S.C.section 360bbb-3(b)(1), unless the authorization is terminated  or revoked sooner.       Influenza A by PCR NEGATIVE NEGATIVE   Influenza B by PCR NEGATIVE NEGATIVE    Comment: (NOTE) The Xpert Xpress SARS-CoV-2/FLU/RSV plus assay is intended as an aid in the diagnosis of influenza from Nasopharyngeal swab specimens and should not be used as a sole basis for treatment. Nasal washings and aspirates are unacceptable for Xpert Xpress SARS-CoV-2/FLU/RSV testing.  Fact Sheet for Patients: EntrepreneurPulse.com.au  Fact Sheet for Healthcare Providers: IncredibleEmployment.be  This test is not yet approved or cleared by the [place] FDA and has been authorized for detection and/or diagnosis of SARS-CoV-2 by FDA under an Emergency Use Authorization (EUA). This EUA will remain in effect (meaning this test can be used) for the duration of the COVID-19 declaration under Section 564(b)(1) of the Act, 21 U.S.C. section 360bbb-3(b)(1), unless the authorization is terminated or revoked.  Performed at [facility], [address], [place], [place] [address]   Procalcitonin - Baseline     Status: None   Collection Time: 11/21/21  5:18 PM  Result Value Ref  Range   Procalcitonin 1.64 ng/mL    Comment:        Interpretation: PCT > 0.5 ng/mL and <= 2 ng/mL: Systemic infection (sepsis) is possible, but other conditions are known to elevate PCT as well. (NOTE)       Sepsis PCT Algorithm           Lower Respiratory Tract                                      Infection PCT Algorithm    ----------------------------     ----------------------------         PCT < 0.25 ng/mL                PCT < 0.10 ng/mL          Strongly encourage             Strongly discourage   discontinuation of antibiotics    initiation of antibiotics    ----------------------------     -----------------------------       PCT 0.25 - 0.50 ng/mL            PCT 0.10 - 0.25 ng/mL               OR       >80% decrease in PCT            Discourage initiation of                                            antibiotics      Encourage discontinuation           of antibiotics    ----------------------------     -----------------------------         PCT >= 0.50 ng/mL              PCT 0.26 - 0.50 ng/mL                AND       <80% decrease in PCT             Encourage initiation of                                             antibiotics       Encourage continuation           of antibiotics    ----------------------------     -----------------------------        PCT >= 0.50 ng/mL  PCT > 0.50 ng/mL               AND         increase in PCT                  Strongly encourage                                      initiation of antibiotics    Strongly encourage escalation           of antibiotics                                     -----------------------------                                           PCT <= 0.25 ng/mL                                                 OR                                        > 80% decrease in PCT                                      Discontinue / Do not initiate                                             antibiotics  Performed at  [facility], [address] [place], [place] [address]   Lactic acid, plasma     Status: None   Collection Time: 11/21/21  5:18 PM  Result Value Ref Range   Lactic Acid, Venous 1.7 0.5 - 1.9 mmol/L    Comment: Performed at [facility], [address], [place], [place] [address]  Lactic acid, plasma     Status: Abnormal   Collection Time: 11/21/21  6:43 PM  Result Value Ref Range   Lactic Acid, Venous 2.0 (HH) 0.5 - 1.9 mmol/L    Comment: CRITICAL VALUE NOTED.  VALUE IS CONSISTENT WITH PREVIOUSLY REPORTED AND CALLED VALUE. Performed at [facility], [address], [place], [place] [address]   Procalcitonin     Status: None   Collection Time: 11/21/21  9:09 PM  Result Value Ref Range   Procalcitonin 2.04 ng/mL    Comment:        Interpretation: PCT > 2 ng/mL: Systemic infection (sepsis) is likely, unless other causes are known. (NOTE)       Sepsis PCT Algorithm           Lower Respiratory Tract  Infection PCT Algorithm    ----------------------------     ----------------------------         PCT < 0.25 ng/mL                PCT < 0.10 ng/mL          Strongly encourage             Strongly discourage   discontinuation of antibiotics    initiation of antibiotics    ----------------------------     -----------------------------       PCT 0.25 - 0.50 ng/mL            PCT 0.10 - 0.25 ng/mL               OR       >80% decrease in PCT            Discourage initiation of                                            antibiotics      Encourage discontinuation           of antibiotics    ----------------------------     -----------------------------         PCT >= 0.50 ng/mL              PCT 0.26 - 0.50 ng/mL               AND       <80% decrease in PCT              Encourage initiation of                                             antibiotics       Encourage continuation           of antibiotics    ----------------------------      -----------------------------        PCT >= 0.50 ng/mL                  PCT > 0.50 ng/mL               AND         increase in PCT                  Strongly encourage                                      initiation of antibiotics    Strongly encourage escalation           of antibiotics                                     -----------------------------                                           PCT <= 0.25 ng/mL  OR                                        > 80% decrease in PCT                                      Discontinue / Do not initiate                                             antibiotics  Performed at [facility] [address] [place], [place] [address]   CBC with Differential/Platelet     Status: Abnormal   Collection Time: 11/22/21  3:58 AM  Result Value Ref Range   WBC 8.6 4.0 - 10.5 K/uL   RBC 4.40 4.22 - 5.81 MIL/uL   Hemoglobin 12.6 (L) 13.0 - 17.0 g/dL   HCT 38.2 (L) 39.0 - 52.0 %   MCV 86.8 80.0 - 100.0 fL   MCH 28.6 26.0 - 34.0 pg   MCHC 33.0 30.0 - 36.0 g/dL   RDW 14.4 11.5 - 15.5 %   Platelets 116 (L) 150 - 400 K/uL    Comment: SPECIMEN CHECKED FOR CLOTS Immature Platelet Fraction may be clinically indicated, consider ordering this additional test [id number] REPEATED TO VERIFY PLATELET COUNT CONFIRMED BY SMEAR    nRBC 0.0 0.0 - 0.2 %   Neutrophils Relative % 75 %   Neutro Abs 6.5 1.7 - 7.7 K/uL   Lymphocytes Relative 9 %   Lymphs Abs 0.8 0.7 - 4.0 K/uL   Monocytes Relative 15 %   Monocytes Absolute 1.3 (H) 0.1 - 1.0 K/uL   Eosinophils Relative 0 %   Eosinophils Absolute 0.0 0.0 - 0.5 K/uL   Basophils Relative 0 %   Basophils Absolute 0.0 0.0 - 0.1 K/uL   Immature Granulocytes 1 %   Abs Immature Granulocytes 0.05 0.00 - 0.07 K/uL   Giant PLTs PRESENT     Comment: Performed at [facility], [place] [address] [place], [place] [address]  Comprehensive metabolic panel      Status: Abnormal   Collection Time: 11/22/21  3:58 AM  Result Value Ref Range   Sodium 138 135 - 145 mmol/L   Potassium 3.4 (L) 3.5 - 5.1 mmol/L   Chloride 104 98 - 111 mmol/L   CO2 27 22 - 32 mmol/L   Glucose, Bld 118 (H) 70 - 99 mg/dL    Comment: Glucose reference range applies only to samples taken after fasting for at least 8 hours.   BUN 17 8 - 23 mg/dL   Creatinine, Ser 0.81 0.61 - 1.24 mg/dL   Calcium 8.3 (L) 8.9 - 10.3 mg/dL   Total Protein 6.3 (L) 6.5 - 8.1 g/dL   Albumin 2.9 (L) 3.5 - 5.0 g/dL   AST 16 15 - 41 U/L   ALT 12 0 - 44 U/L   Alkaline Phosphatase 64 38 - 126 U/L   Total Bilirubin 1.0 0.3 - 1.2 mg/dL   GFR, Estimated >60 >60 mL/min    Comment: (NOTE) Calculated using the CKD-EPI Creatinine Equation (2021)    Anion gap 7 5 - 15    Comment: Performed at [facility], [place] [address], [place], [place] [address]  Magnesium     Status: None   Collection Time: 11/22/21  3:58 AM  Result Value Ref Range   Magnesium 1.7 1.7 - 2.4 mg/dL    Comment: Performed at [facility], [place] [address], [place], [place] [address]   Recent Results (from the past 240 hour(s))  Culture, blood (routine x 2)     Status: None (Preliminary result)   Collection Time: 11/21/21 12:00 PM   Specimen: Right Antecubital; Blood  Result Value Ref Range Status   Specimen Description RIGHT ANTECUBITAL  Final   Special Requests   Final    BOTTLES DRAWN AEROBIC AND ANAEROBIC Blood Culture adequate volume   Culture   Final    NO GROWTH < 24 HOURS Performed at [facility], [address], [place], [place] [address]    Report Status PENDING  Incomplete  Resp Panel by RT-PCR (Flu A&B, Covid) Nasopharyngeal Swab     Status: None   Collection Time: 11/21/21  4:19 PM   Specimen: Nasopharyngeal Swab; Nasopharyngeal(NP) swabs in vial transport medium  Result Value Ref Range Status   SARS Coronavirus 2 by RT PCR NEGATIVE NEGATIVE Final    Comment:  (NOTE) SARS-CoV-2 target nucleic acids are NOT DETECTED.  The SARS-CoV-2 RNA is generally detectable in upper respiratory specimens during the acute phase of infection. The lowest concentration of SARS-CoV-2 viral copies this assay can detect is 138 copies/mL. A negative result does not preclude SARS-Cov-2 infection and should not be used as the sole basis for treatment or other patient management decisions. A negative result may occur with  improper specimen collection/handling, submission of specimen other than nasopharyngeal swab, presence of viral mutation(s) within the areas targeted by this assay, and inadequate number of viral copies(<138 copies/mL). A negative result must be combined with clinical observations, patient history, and epidemiological information. The expected result is Negative.  Fact Sheet for Patients:  EntrepreneurPulse.com.au  Fact Sheet for Healthcare Providers:  IncredibleEmployment.be  This test is no t yet approved or cleared by the [place] FDA and  has been authorized for detection and/or diagnosis of SARS-CoV-2 by FDA under an Emergency Use Authorization (EUA). This EUA will remain  in effect (meaning this test can be used) for the duration of the COVID-19 declaration under Section 564(b)(1) of the Act, 21 U.S.C.section 360bbb-3(b)(1), unless the authorization is terminated  or revoked sooner.       Influenza A by PCR NEGATIVE NEGATIVE Final   Influenza B by PCR NEGATIVE NEGATIVE Final    Comment: (NOTE) The Xpert Xpress SARS-CoV-2/FLU/RSV plus assay is intended as an aid in the diagnosis of influenza from Nasopharyngeal swab specimens and should not be used as a sole basis for treatment. Nasal washings and aspirates are unacceptable for Xpert Xpress SARS-CoV-2/FLU/RSV testing.  Fact Sheet for Patients: EntrepreneurPulse.com.au  Fact Sheet for Healthcare  Providers: IncredibleEmployment.be  This test is not yet approved or cleared by the [place] FDA and has been authorized for detection and/or diagnosis of SARS-CoV-2 by FDA under an Emergency Use Authorization (EUA). This EUA will remain in effect (meaning this test can be used) for the duration of the COVID-19 declaration under Section 564(b)(1) of the Act, 21 U.S.C. section 360bbb-3(b)(1), unless the authorization is terminated or revoked.  Performed at [facility], [address], [place], [place] [address]    Creatinine: Recent Labs    11/21/21 1039 11/22/21 0358  CREATININE 1.29* 0.81  Baseline Creatinine: 0.8  Impression/Assessment:  70yo with 1cm distal right ureteral calculus, UTI  Plan:  1: Right ureteral calculus: -We discussed the management of kidney stones. These options include observation, ureteroscopy, shockwave lithotripsy (ESWL) and percutaneous nephrolithotomy (PCNL). We discussed which options are relevant to the patient's stone(s). We discussed the natural history of kidney stones as well as the complications of untreated stones and the impact on quality of life without treatment as well as with each of the above listed treatments. We also discussed the efficacy of each treatment in its ability to clear the stone burden. With any of these management options I discussed the signs and symptoms of infection and the need for emergent treatment should these be experienced. For each option we discussed the ability of each procedure to clear the patient of their stone burden.   For observation I described the risks which include but are not limited to silent renal damage, life-threatening infection, need for emergent surgery, failure to pass stone and pain.   For ureteroscopy I described the risks which include bleeding, infection, damage to contiguous structures, positioning injury, ureteral stricture, ureteral avulsion, ureteral injury, need for  prolonged ureteral stent, inability to perform ureteroscopy, need for an interval procedure, inability to clear stone burden, stent discomfort/pain, heart attack, stroke, pulmonary embolus and the inherent risks with general anesthesia.   For shockwave lithotripsy I described the risks which include arrhythmia, kidney contusion, kidney hemorrhage, need for transfusion, pain, inability to adequately break up stone, inability to pass stone fragments, Steinstrasse, infection associated with obstructing stones, need for alternate surgical procedure, need for repeat shockwave lithotripsy, MI, CVA, PE and the inherent risks with anesthesia/conscious sedation.   For PCNL I described the risks including positioning injury, pneumothorax, hydrothorax, need for chest tube, inability to clear stone burden, renal laceration, arterial venous fistula or malformation, need for embolization of kidney, loss of kidney or renal function, need for repeat procedure, need for prolonged nephrostomy tube, ureteral avulsion, MI, CVA, PE and the inherent risks of general anesthesia.   - The patient would like to proceed with right nephrostomy tube placement. Discussed case with IR and patient will be scheduled for nephrostomy tube placement tomorrow. Please place foley catheter.   Nicolette Bang 11/22/2021, 1:37 PM

## 2021-11-22 NOTE — Transfer of Care (Signed)
Immediate Anesthesia Transfer of Care Note  Patient: Raymond Walker  Procedure(s) Performed: CYSTOSCOPY WITH RETROGRADE PYELOGRAM/URETERAL STENT PLACEMENT (Right: Ureter)  Patient Location: PACU  Anesthesia Type:General  Level of Consciousness: awake, alert  and oriented  Airway & Oxygen Therapy: Patient Spontanous Breathing and Patient connected to face mask  Post-op Assessment: Report given to RN and Post -op Vital signs reviewed and stable  Post vital signs: Reviewed and stable  Last Vitals:  Vitals Value Taken Time  BP 124/67 11/22/21 1930  Temp    Pulse 73 11/22/21 1931  Resp 19 11/22/21 1931  SpO2 98 % 11/22/21 1931  Vitals shown include unvalidated device data.  Last Pain:  Vitals:   11/22/21 1737  TempSrc: Oral  PainSc: 0-No pain      Patients Stated Pain Goal: 3 (71/16/57 9038)  Complications: No notable events documented.

## 2021-11-22 NOTE — Anesthesia Procedure Notes (Signed)
Procedure Name: LMA Insertion Date/Time: 11/22/2021 7:02 PM Performed by: Rosaland Lao, CRNA Pre-anesthesia Checklist: Patient identified, Emergency Drugs available, Suction available and Patient being monitored Patient Re-evaluated:Patient Re-evaluated prior to induction Oxygen Delivery Method: Circle system utilized Preoxygenation: Pre-oxygenation with 100% oxygen Induction Type: IV induction LMA: LMA with gastric port inserted LMA Size: 4.0 Tube type: Oral Number of attempts: 1 Airway Equipment and Method: Oral airway Placement Confirmation: positive ETCO2 and breath sounds checked- equal and bilateral Tube secured with: Tape Dental Injury: Teeth and Oropharynx as per pre-operative assessment

## 2021-11-22 NOTE — Anesthesia Preprocedure Evaluation (Addendum)
Anesthesia Evaluation  Patient identified by MRN, date of birth, ID band Patient awake    Reviewed: Allergy & Precautions, NPO status , Patient's Chart, lab work & pertinent test results  Airway Mallampati: I  TM Distance: >3 FB Neck ROM: Full    Dental  (+) Edentulous Upper, Edentulous Lower, Dental Advisory Given   Pulmonary COPD,  COPD inhaler, Patient abstained from smoking., former smoker,    Pulmonary exam normal breath sounds clear to auscultation       Cardiovascular hypertension, + CAD and + Peripheral Vascular Disease  Normal cardiovascular exam Rhythm:Regular Rate:Normal  TTE 2020 1. The left ventricle has normal systolic function with an ejection  fraction of 60-65%. The cavity size was normal. Left ventricular diastolic  Doppler parameters are consistent with pseudonormalization. Elevated mean  left atrial pressure.  2. The right ventricle has normal systolic function. The cavity was  normal.  3. Left atrial size was mildly dilated.  4. The mitral valve is grossly normal.  5. The tricuspid valve is grossly normal.  6. The aortic valve is tricuspid. Mild thickening of the aortic valve. No  stenosis of the aortic valve.  7. The aorta is normal unless otherwise noted.  8. Normal LV systolic function; grade 2 diastolic dysfunction; mild  proximal septal thickening; mild LAE   Neuro/Psych Seizures - (remote history as child),  negative psych ROS   GI/Hepatic Neg liver ROS, GERD  ,  Endo/Other  diabetes  Renal/GU negative Renal ROS  negative genitourinary   Musculoskeletal negative musculoskeletal ROS (+)   Abdominal   Peds  Hematology  (+) Blood dyscrasia (on plavix), , Lab Results      Component                Value               Date                      WBC                      8.6                 11/22/2021                HGB                      12.6 (L)            11/22/2021                 HCT                      38.2 (L)            11/22/2021                MCV                      86.8                11/22/2021                PLT                      116 (L)             11/22/2021  Anesthesia Other Findings Right ureteral stone  Reproductive/Obstetrics                            Anesthesia Physical Anesthesia Plan  ASA: 3  Anesthesia Plan: General   Post-op Pain Management: Tylenol PO (pre-op)   Induction: Intravenous  PONV Risk Score and Plan: 2 and Ondansetron, Dexamethasone and Midazolam  Airway Management Planned: LMA  Additional Equipment:   Intra-op Plan:   Post-operative Plan: Extubation in OR  Informed Consent: I have reviewed the patients History and Physical, chart, labs and discussed the procedure including the risks, benefits and alternatives for the proposed anesthesia with the patient or authorized representative who has indicated his/her understanding and acceptance.   Patient has DNR.  Discussed DNR with patient and Suspend DNR.   Dental advisory given  Plan Discussed with: CRNA  Anesthesia Plan Comments:         Anesthesia Quick Evaluation

## 2021-11-23 ENCOUNTER — Encounter (HOSPITAL_COMMUNITY): Payer: Self-pay | Admitting: Urology

## 2021-11-23 LAB — CBC WITH DIFFERENTIAL/PLATELET
Abs Immature Granulocytes: 0.07 10*3/uL (ref 0.00–0.07)
Basophils Absolute: 0 10*3/uL (ref 0.0–0.1)
Basophils Relative: 0 %
Eosinophils Absolute: 0 10*3/uL (ref 0.0–0.5)
Eosinophils Relative: 0 %
HCT: 39.7 % (ref 39.0–52.0)
Hemoglobin: 13.1 g/dL (ref 13.0–17.0)
Immature Granulocytes: 1 %
Lymphocytes Relative: 7 %
Lymphs Abs: 0.4 10*3/uL — ABNORMAL LOW (ref 0.7–4.0)
MCH: 28.5 pg (ref 26.0–34.0)
MCHC: 33 g/dL (ref 30.0–36.0)
MCV: 86.3 fL (ref 80.0–100.0)
Monocytes Absolute: 0.2 10*3/uL (ref 0.1–1.0)
Monocytes Relative: 3 %
Neutro Abs: 5.3 10*3/uL (ref 1.7–7.7)
Neutrophils Relative %: 89 %
Platelets: 127 10*3/uL — ABNORMAL LOW (ref 150–400)
RBC: 4.6 MIL/uL (ref 4.22–5.81)
RDW: 14.1 % (ref 11.5–15.5)
WBC: 6 10*3/uL (ref 4.0–10.5)
nRBC: 0 % (ref 0.0–0.2)

## 2021-11-23 LAB — BASIC METABOLIC PANEL
Anion gap: 6 (ref 5–15)
BUN: 24 mg/dL — ABNORMAL HIGH (ref 8–23)
CO2: 27 mmol/L (ref 22–32)
Calcium: 8.4 mg/dL — ABNORMAL LOW (ref 8.9–10.3)
Chloride: 104 mmol/L (ref 98–111)
Creatinine, Ser: 0.81 mg/dL (ref 0.61–1.24)
GFR, Estimated: >60 mL/min (ref >60)
Glucose, Bld: 162 mg/dL — ABNORMAL HIGH (ref 70–99)
Potassium: 3.6 mmol/L (ref 3.5–5.1)
Sodium: 137 mmol/L (ref 135–145)

## 2021-11-23 LAB — PROCALCITONIN: Procalcitonin: 1.31 ng/mL

## 2021-11-23 LAB — URINE CULTURE: Culture: NO GROWTH

## 2021-11-23 MED ORDER — CEPHALEXIN 500 MG PO CAPS
500.0000 mg | ORAL_CAPSULE | Freq: Four times a day (QID) | ORAL | 0 refills | Status: AC
Start: 2021-11-23 — End: 2021-11-28

## 2021-11-23 MED ORDER — MOMETASONE FURO-FORMOTEROL FUM 200-5 MCG/ACT IN AERO: 2.0000 | INHALATION_SPRAY | Freq: Two times a day (BID) | RESPIRATORY_TRACT | 0 refills | Status: DC

## 2021-11-23 MED ORDER — IPRATROPIUM-ALBUTEROL 0.5-2.5 (3) MG/3ML IN SOLN
3.0000 mL | Freq: Three times a day (TID) | RESPIRATORY_TRACT | Status: DC
  Administered 2021-11-23: 3 mL via RESPIRATORY_TRACT
  Filled 2021-11-23 (×2): qty 3

## 2021-11-23 NOTE — Progress Notes (Signed)
Pt discharged to home, instructions reviewed with pt and niece, acknowledged understanding of instructions and where to pick up prescriptions. SRP, RN

## 2021-11-23 NOTE — Plan of Care (Signed)
  Problem: Education: Goal: Knowledge of General Education information will improve Description Including pain rating scale, medication(s)/side effects and non-pharmacologic comfort measures Outcome: Progressing   

## 2021-11-23 NOTE — Progress Notes (Signed)
1 Day Post-Op Subjective: Patient reports no difficulties currently.  No stent related symptoms  Objective: Vital signs in last 24 hours: Temp:  [97.8 F (36.6 C)-98.8 F (37.1 C)] 98.2 F (36.8 C) (12/08 1308) Pulse Rate:  [68-88] 69 (12/08 1308) Resp:  [15-20] 18 (12/08 1308) BP: (115-143)/(58-81) 122/60 (12/08 1308) SpO2:  [94 %-99 %] 95 % (12/08 1308) Weight:  [67 kg-68.5 kg] 68.5 kg (12/08 0359)  Intake/Output from previous day: 12/07 0701 - 12/08 0700 In: 989.4 [P.O.:240; I.V.:749.4] Out: 975 [Urine:975] Intake/Output this shift: Total I/O In: 240 [P.O.:240] Out: 150 [Urine:150]  Physical Exam:  Constitutional: Vital signs reviewed. WD WN in NAD   Eyes: PERRL, No scleral icterus.   Cardiovascular: RRR Pulmonary/Chest: Normal effort  Extremities: No cyanosis or edema   Lab Results: Recent Labs    11/21/21 1039 11/22/21 0358 11/23/21 0401  HGB 15.1 12.6* 13.1  HCT 45.4 38.2* 39.7   BMET Recent Labs    11/22/21 0358 11/23/21 0401  NA 138 137  K 3.4* 3.6  CL 104 104  CO2 27 27  GLUCOSE 118* 162*  BUN 17 24*  CREATININE 0.81 0.81  CALCIUM 8.3* 8.4*   No results for input(s): LABPT, INR in the last 72 hours. No results for input(s): LABURIN in the last 72 hours. Results for orders placed or performed during the hospital encounter of 11/21/21  Culture, blood (routine x 2)     Status: None (Preliminary result)   Collection Time: 11/21/21 12:00 PM   Specimen: Right Antecubital; Blood  Result Value Ref Range Status   Specimen Description RIGHT ANTECUBITAL  Final   Special Requests   Final    BOTTLES DRAWN AEROBIC AND ANAEROBIC Blood Culture adequate volume   Culture   Final    NO GROWTH < 24 HOURS Performed at [facility], [address] [place], [place] [address]    Report Status PENDING  Incomplete  Resp Panel by RT-PCR (Flu A&B, Covid) Nasopharyngeal Swab     Status: None   Collection Time: 11/21/21  4:19 PM   Specimen: Nasopharyngeal Swab;  Nasopharyngeal(NP) swabs in vial transport medium  Result Value Ref Range Status   SARS Coronavirus 2 by RT PCR NEGATIVE NEGATIVE Final    Comment: (NOTE) SARS-CoV-2 target nucleic acids are NOT DETECTED.  The SARS-CoV-2 RNA is generally detectable in upper respiratory specimens during the acute phase of infection. The lowest concentration of SARS-CoV-2 viral copies this assay can detect is 138 copies/mL. A negative result does not preclude SARS-Cov-2 infection and should not be used as the sole basis for treatment or other patient management decisions. A negative result may occur with  improper specimen collection/handling, submission of specimen other than nasopharyngeal swab, presence of viral mutation(s) within the areas targeted by this assay, and inadequate number of viral copies(<138 copies/mL). A negative result must be combined with clinical observations, patient history, and epidemiological information. The expected result is Negative.  Fact Sheet for Patients:  EntrepreneurPulse.com.au  Fact Sheet for Healthcare Providers:  IncredibleEmployment.be  This test is no t yet approved or cleared by the [place] FDA and  has been authorized for detection and/or diagnosis of SARS-CoV-2 by FDA under an Emergency Use Authorization (EUA). This EUA will remain  in effect (meaning this test can be used) for the duration of the COVID-19 declaration under Section 564(b)(1) of the Act, 21 U.S.C.section 360bbb-3(b)(1), unless the authorization is terminated  or revoked sooner.       Influenza A by  PCR NEGATIVE NEGATIVE Final   Influenza B by PCR NEGATIVE NEGATIVE Final    Comment: (NOTE) The Xpert Xpress SARS-CoV-2/FLU/RSV plus assay is intended as an aid in the diagnosis of influenza from Nasopharyngeal swab specimens and should not be used as a sole basis for treatment. Nasal washings and aspirates are unacceptable for Xpert Xpress  SARS-CoV-2/FLU/RSV testing.  Fact Sheet for Patients: EntrepreneurPulse.com.au  Fact Sheet for Healthcare Providers: IncredibleEmployment.be  This test is not yet approved or cleared by the [place] FDA and has been authorized for detection and/or diagnosis of SARS-CoV-2 by FDA under an Emergency Use Authorization (EUA). This EUA will remain in effect (meaning this test can be used) for the duration of the COVID-19 declaration under Section 564(b)(1) of the Act, 21 U.S.C. section 360bbb-3(b)(1), unless the authorization is terminated or revoked.  Performed at [facility], [address], [place], [place] [address]   Urine Culture     Status: None   Collection Time: 11/22/21  4:26 PM   Specimen: Urine, Clean Catch  Result Value Ref Range Status   Specimen Description   Final    URINE, CLEAN CATCH Performed at [facility], [place] [address] [place], [place] [address]    Special Requests   Final    NONE Performed at [facility], [place] [address] [place], [place] [address]    Culture   Final    NO GROWTH Performed at [facility], [place] [address], [place], [place] [address]    Report Status 11/23/2021 FINAL  Final  Surgical pcr screen     Status: None   Collection Time: 11/22/21  5:12 PM   Specimen: Nasal Mucosa; Nasal Swab  Result Value Ref Range Status   MRSA, PCR NEGATIVE NEGATIVE Final   Staphylococcus aureus NEGATIVE NEGATIVE Final    Comment: (NOTE) The Xpert SA Assay (FDA approved for NASAL specimens in patients 32 years of age and older), is one component of a comprehensive surveillance program. It is not intended to diagnose infection nor to guide or monitor treatment. Performed at [facility], [place] [address] [place],  [address]     Studies/Results: DG C-Arm 1-60 Min-No Report  Result Date: 11/22/2021 Fluoroscopy was utilized by the requesting  physician.  No radiographic interpretation.    Urine/blood cultures negative  Assessment/Plan:  Status post urgent right ureteral stent placement for possible UTI/sepsis.  Multiple right distal ureteral stones.  Currently he is doing well.  I am fine with discharge at any point.  We will follow him up in our [place] office for eventual procedure scheduled with Dr. Alyson Ingles.  I would send home with 5 days of antibiotic, Keflex would be fine   LOS: 2 days   Jorja Loa 11/23/2021, 3:28 PM

## 2021-11-23 NOTE — Progress Notes (Addendum)
Patient ambulated in hallway.  Patient is using IS correctly as evidenced by Teach Back Technique.  Tolerated well.

## 2021-11-23 NOTE — Plan of Care (Signed)
  Problem: Education: Goal: Knowledge of General Education information will improve Description: Including pain rating scale, medication(s)/side effects and non-pharmacologic comfort measures 11/23/2021 1705 by Zadie Rhine, RN Outcome: Adequate for Discharge 11/23/2021 1343 by Zadie Rhine, RN Outcome: Progressing

## 2021-11-23 NOTE — Discharge Summary (Signed)
Discharge Summary  Raymond Walker. QTM:[id number] DOB: 01-23-1951  PCP: [name], MD  Admit date: 11/21/2021 Discharge date: 11/23/2021  Time spent: 40 mins  Recommendations for Outpatient Follow-up:  PCP in 1 week Urology follow-up as scheduled   Discharge Diagnoses:  Active Hospital Problems   Diagnosis Date Noted   Kidney stone on right side 11/21/2021   DNR (do not resuscitate)    Sepsis ([place]) 01/23/2020   PAD (peripheral artery disease) ([place]) 09/15/2019   COPD (chronic obstructive pulmonary disease) ([place])     Resolved Hospital Problems  No resolved problems to display.    Discharge Condition: Stable  Diet recommendation: Heart healthy diet  Vitals:   11/23/21 0855 11/23/21 1308  BP:  122/60  Pulse:  69  Resp:  18  Temp:  98.2 F (36.8 C)  SpO2: 96% 95%    History of present illness:  70 year old male with a history of COPD, peripheral arterial disease, numerous kidney stones in the past presents to the ER today with a 1 week history of fever, chills, inability urinate. Patient states that he has had 47 kidney stones in the past.  He has never had surgery for them.  He usually gets lithotripsy. Pt has not passed any kidney stones. He has had increasing fever and chills with rigors. In the ED, pt was tachycardic with a heart rate of 119. Labs showed a lactic acid of 2.9.  Serum creatinine increased to 1.29 with a BUN of 46.  White count elevated at 14.2. UA showed mod leukocytes, large hgb, rare bacteria, >50 WBC. CT showed numerous obstructing kidney stone on the right measuring approximately 1 cm in diameter. Urology consulted. Pt admitted for further management.   Today, patient denies any new complaints s/p stent placement.  Denies any abdominal pain, flank pain, nausea/vomiting, chest pain, shortness of breath.    Hospital Course:  Principal Problem:   Kidney stone on right side Active Problems:   COPD (chronic obstructive pulmonary disease)  ([facility])   PAD (peripheral artery disease) ([place])   Sepsis ([facility])   DNR (do not resuscitate)   Sepsis likely 2/2 UTI Noted obstructing right kidney stone (history of numerous kidney stones) On admission, noted tachycardia, leukocytosis Currently afebrile, last temp of 100.4 on 11/21/2021, resolved leukocytosis UA showed mod leukocytes, large hgb, rare bacteria, >50 WBC, urine culture showed no growth BC x2 NGTD CT showed numerous obstructing kidney stone on the right measuring approximately 1 cm in diameter Urology consulted, s/p cystoscopy, placement of double-J stent in R ureter.  Recommend another 5 days of oral Keflex and follow-up as an outpatient for elective management of multiple kidney stones S/p IV Rocephin, IV fluids DC on 5 days of p.o. Keflex   AKI Resolved s/p IV fluids Daily BMP   History of COPD Currently noted to have bilateral wheezing (patient reports daily wheezing, only on albuterol as needed) Current tobacco abuse, about a pack a day Discharge patient on Dulera, continue home albuterol as needed   History of PAD Continue PTA Plavix   Tobacco abuse Advised to quit-about 1 pack a day Refused nicotine patch      Estimated body mass index is 20.48 kg/m as calculated from the following:   Height as of this encounter: 6' (1.829 m).   Weight as of this encounter: 68.5 kg.    Procedures: Right ureteral stent placement  Consultations: Urology  Discharge Exam: BP 122/60 (BP Location: Left Arm)   Pulse 69   Temp  98.2 F (36.8 C) (Oral)   Resp 18   Ht 6' (1.829 m)   Wt 68.5 kg   SpO2 95%   BMI 20.48 kg/m   General: NAD  Cardiovascular: S1, S2 present Respiratory: CTAB Abdomen: Soft, nontender, nondistended, bowel sounds present Musculoskeletal: No bilateral pedal edema noted Skin: Normal Psychiatry: Normal mood    Discharge Instructions You were cared for by a hospitalist during your hospital stay. If you have any questions about your discharge  medications or the care you received while you were in the hospital after you are discharged, you can call the unit and asked to speak with the hospitalist on call if the hospitalist that took care of you is not available. Once you are discharged, your primary care physician will handle any further medical issues. Please note that NO REFILLS for any discharge medications will be authorized once you are discharged, as it is imperative that you return to your primary care physician (or establish a relationship with a primary care physician if you do not have one) for your aftercare needs so that they can reassess your need for medications and monitor your lab values.   Allergies as of 11/23/2021   No Known Allergies      Medication List     STOP taking these medications    doxycycline 100 MG tablet Commonly known as: VIBRA-TABS   mupirocin ointment 2 % Commonly known as: BACTROBAN   traMADol 50 MG tablet Commonly known as: ULTRAM       TAKE these medications    albuterol 108 (90 Base) MCG/ACT inhaler Commonly known as: VENTOLIN HFA INHALE 2 PUFFS INTO THE LUNGS EVERY 4 HOURS AS NEEDED FOR WHEEZING OR SHORTNESS OF BREATH   cephALEXin 500 MG capsule Commonly known as: KEFLEX Take 1 capsule (500 mg total) by mouth 4 (four) times daily for 5 days.   clopidogrel 75 MG tablet Commonly known as: PLAVIX TAKE 1 TABLET(75 MG) BY MOUTH DAILY WITH BREAKFAST What changed: See the new instructions.   mometasone-formoterol 200-5 MCG/ACT Aero Commonly known as: DULERA Inhale 2 puffs into the lungs 2 (two) times daily.   pantoprazole 40 MG tablet Commonly known as: PROTONIX TAKE 1 TABLET(40 MG) BY MOUTH DAILY   rosuvastatin 20 MG tablet Commonly known as: CRESTOR TAKE 1 TABLET(20 MG) BY MOUTH DAILY   sildenafil 100 MG tablet Commonly known as: Viagra Take 0.5-1 tablets (50-100 mg total) by mouth daily as needed for erectile dysfunction.   sucralfate 1 g tablet Commonly known as:  CARAFATE TAKE 1 TABLET(1 GRAM) BY MOUTH FOUR TIMES DAILY AT BEDTIME WITH MEALS       No Known Allergies  Follow-up Information     [name], MD. Schedule an appointment as soon as possible for a visit in 1 week(s).   Specialty: Family Medicine Contact information: [place] [address] [phone]         Cleon Gustin, MD Follow up.   Specialty: Urology Why: Office will call for a follow up appointment. If you dont hear from them in 1 week, please call to schedule a follow up appointment Contact information: [place] [address] [phone]                  The results of significant diagnostics from this hospitalization (including imaging, microbiology, ancillary and laboratory) are listed below for reference.    Significant Diagnostic Studies: CT Head Wo Contrast  Result Date: 11/21/2021 CLINICAL DATA:  Dizziness EXAM: CT HEAD WITHOUT CONTRAST TECHNIQUE: Contiguous axial images were obtained from the base of the skull through the vertex without intravenous contrast. COMPARISON:  Brain MRI 11/06/2019, CT head 01/23/2020 FINDINGS: Brain: There is no evidence of acute intracranial hemorrhage, extra-axial fluid collection, or acute infarct. There is mild parenchymal volume loss. The ventricles are normal in size. There is no significant burden of white matter microangiopathy. There is no solid mass lesion.  There is no midline shift. Vascular: There is calcification of the bilateral cavernous ICAs. Skull: Normal. Negative for fracture or focal lesion. Sinuses/Orbits: The paranasal sinuses are clear. Bilateral lens implants are in place. The globes and orbits are otherwise unremarkable. Other: None. IMPRESSION: No acute intracranial pathology. Electronically Signed   By: Valetta Mole M.D.   On: 11/21/2021 15:26   CT ABDOMEN PELVIS W CONTRAST  Result Date: 11/21/2021 CLINICAL DATA:  Nausea, vomiting, dizziness  EXAM: CT ABDOMEN AND PELVIS WITH CONTRAST TECHNIQUE: Multidetector CT imaging of the abdomen and pelvis was performed using the standard protocol following bolus administration of intravenous contrast. CONTRAST:  133mL OMNIPAQUE IOHEXOL 300 MG/ML  SOLN COMPARISON:  CT abdomen/pelvis 11/30/2019 FINDINGS: Lower chest: There is severe emphysema in the lung bases with essentially no lung markings in the anterior portion of the left lung base, similar to prior studies. The imaged heart is unremarkable. Hepatobiliary: The liver is diffusely hypoattenuating likely reflecting fatty infiltration. The gallbladder is surgically absent. There is no biliary ductal dilatation. Pancreas: The pancreatic body is atrophic, unchanged. There are no focal lesions or contour abnormalities. There is no main pancreatic ductal dilatation or peripancreatic inflammatory change. Spleen: A few punctate calcified granulomas are seen in the spleen. The spleen is otherwise unremarkable. Adrenals/Urinary Tract: The adrenals are unremarkable. An irregular somewhat thick-walled cyst with calcifications along the posterior wall in the left interpolar region associated with an area of marked cortical scarring measuring approximally 3.2 cm x 2.8 cm is not significantly changed going back to 2018. Additional renal cysts measuring up to 3.8 cm in the left lower pole are again seen, not significantly changed. Numerous nonobstructing calculi are seen in both kidneys measuring up to 8 mm in the left lower pole. There are multiple stones in the distal right ureter measuring up to 1.1 cm proximal to the UVJ, new since 11/30/2019. There is no upstream hydroureteronephrosis. There is no left hydroureteronephrosis. No stones are seen in the left ureter. The bladder wall is thickened suggesting chronic outlet obstruction due to the enlarged prostate. Stomach/Bowel: The stomach is unremarkable. There is no evidence of bowel obstruction. There is no abnormal bowel  wall thickening or inflammatory change. The appendix is normal. Vascular/Lymphatic: There is extensive soft and calcified atherosclerotic plaque in the nonaneurysmal abdominal aorta which measures up to 2.6 cm in maximum diameter. A right iliac stent graft is in place. The excluded aneurysmal common iliac artery measures up to 2.0 cm in maximum dimension, unchanged since 2020. There is a presumed right superficial femoral artery stent in place. The artery is occluded just after the bifurcation, new since 11/30/2019 (3-81). The profunda femoral artery is patent to the level imaged. There is a focal dissection in the left common femoral artery, unchanged since 2020 (3-68). The left superficial femoral artery is occluded to the level imaged, unchanged since 2020. The imaged left deep femoral artery is patent. There is no abdominal or pelvic lymphadenopathy. Reproductive: The prostate is enlarged measuring up to 5.7 cm. The seminal  vesicles are unremarkable. Other: There is no ascites or free air. Musculoskeletal: There is no acute osseous abnormality or aggressive osseous lesion. IMPRESSION: 1. Multiple distal right ureteral stones measuring up to 1.0 cm without upstream hydroureteronephrosis. 2. Numerous additional nonobstructing bilateral stones as above. 3. Thick-walled left renal cyst with calcifications along the posterior wall, unchanged going back to at least 2018. 4. Occluded right superficial femoral artery graft shortly after the bifurcation, new since 11/30/2019. The deep femoral artery is patent to the level imaged. 5. Occluded left superficial femoral artery to the level imaged, unchanged since 2020. 6. Stable postprocedural changes reflecting right common iliac artery stent graft with unchanged size of the excluded sac measuring 2.0 cm. 7. Bladder wall thickening suggesting chronic outlet obstructing due to the enlarged prostate. 8. Aortic Atherosclerosis (ICD10-I70.0) and severe emphysema (ICD10-J43.9).  Electronically Signed   By: Valetta Mole M.D.   On: 11/21/2021 14:59   DG Chest Portable 1 View  Result Date: 11/21/2021 CLINICAL DATA:  Dyspnea fall, dizziness EXAM: PORTABLE CHEST 1 VIEW COMPARISON:  Chest radiograph 07/26/2021 FINDINGS: The cardiomediastinal silhouette is stable. Coarsened interstitial markings are again seen in both lungs on a background of hyperlucency likely reflecting scarring and COPD. Findings are overall similar to the prior study. There is no new or worsening focal airspace disease. There is no significant pleural effusion. There is no pneumothorax. The bones are stable. IMPRESSION: Stable findings of COPD. No evidence of acute cardiopulmonary process. Electronically Signed   By: Valetta Mole M.D.   On: 11/21/2021 11:20   DG C-Arm 1-60 Min-No Report  Result Date: 11/22/2021 Fluoroscopy was utilized by the requesting physician.  No radiographic interpretation.    Microbiology: Recent Results (from the past 240 hour(s))  Culture, blood (routine x 2)     Status: None (Preliminary result)   Collection Time: 11/21/21 12:00 PM   Specimen: Right Antecubital; Blood  Result Value Ref Range Status   Specimen Description RIGHT ANTECUBITAL  Final   Special Requests   Final    BOTTLES DRAWN AEROBIC AND ANAEROBIC Blood Culture adequate volume   Culture   Final    NO GROWTH < 24 HOURS Performed at [facility], [address] [place], [place] [address]    Report Status PENDING  Incomplete  Resp Panel by RT-PCR (Flu A&B, Covid) Nasopharyngeal Swab     Status: None   Collection Time: 11/21/21  4:19 PM   Specimen: Nasopharyngeal Swab; Nasopharyngeal(NP) swabs in vial transport medium  Result Value Ref Range Status   SARS Coronavirus 2 by RT PCR NEGATIVE NEGATIVE Final    Comment: (NOTE) SARS-CoV-2 target nucleic acids are NOT DETECTED.  The SARS-CoV-2 RNA is generally detectable in upper respiratory specimens during the acute phase of infection. The lowest concentration of  SARS-CoV-2 viral copies this assay can detect is 138 copies/mL. A negative result does not preclude SARS-Cov-2 infection and should not be used as the sole basis for treatment or other patient management decisions. A negative result may occur with  improper specimen collection/handling, submission of specimen other than nasopharyngeal swab, presence of viral mutation(s) within the areas targeted by this assay, and inadequate number of viral copies(<138 copies/mL). A negative result must be combined with clinical observations, patient history, and epidemiological information. The expected result is Negative.  Fact Sheet for Patients:  EntrepreneurPulse.com.au  Fact Sheet for Healthcare Providers:  IncredibleEmployment.be  This test is no t yet approved or cleared by the [place] and  has been authorized  for detection and/or diagnosis of SARS-CoV-2 by FDA under an Emergency Use Authorization (EUA). This EUA will remain  in effect (meaning this test can be used) for the duration of the COVID-19 declaration under Section 564(b)(1) of the Act, 21 U.S.C.section 360bbb-3(b)(1), unless the authorization is terminated  or revoked sooner.       Influenza A by PCR NEGATIVE NEGATIVE Final   Influenza B by PCR NEGATIVE NEGATIVE Final    Comment: (NOTE) The Xpert Xpress SARS-CoV-2/FLU/RSV plus assay is intended as an aid in the diagnosis of influenza from Nasopharyngeal swab specimens and should not be used as a sole basis for treatment. Nasal washings and aspirates are unacceptable for Xpert Xpress SARS-CoV-2/FLU/RSV testing.  Fact Sheet for Patients: EntrepreneurPulse.com.au  Fact Sheet for Healthcare Providers: IncredibleEmployment.be  This test is not yet approved or cleared by the [place] FDA and has been authorized for detection and/or diagnosis of SARS-CoV-2 by FDA under an Emergency Use  Authorization (EUA). This EUA will remain in effect (meaning this test can be used) for the duration of the COVID-19 declaration under Section 564(b)(1) of the Act, 21 U.S.C. section 360bbb-3(b)(1), unless the authorization is terminated or revoked.  Performed at [facility], [address], [place], [place] [address]   Urine Culture     Status: None   Collection Time: 11/22/21  4:26 PM   Specimen: Urine, Clean Catch  Result Value Ref Range Status   Specimen Description   Final    URINE, CLEAN CATCH Performed at [facility], [place] [address], [place], [place] [address]    Special Requests   Final    NONE Performed at [facility], [place] [address], [place], [place] [address]    Culture   Final    NO GROWTH Performed at [facility], [place] [address], [place], [place] [address]    Report Status 11/23/2021 FINAL  Final  Surgical pcr screen     Status: None   Collection Time: 11/22/21  5:12 PM   Specimen: Nasal Mucosa; Nasal Swab  Result Value Ref Range Status   MRSA, PCR NEGATIVE NEGATIVE Final   Staphylococcus aureus NEGATIVE NEGATIVE Final    Comment: (NOTE) The Xpert SA Assay (FDA approved for NASAL specimens in patients 42 years of age and older), is one component of a comprehensive surveillance program. It is not intended to diagnose infection nor to guide or monitor treatment. Performed at [facility], [place] [address] [place], [place] [address]      Labs: Basic Metabolic Panel: Recent Labs  Lab 11/21/21 1039 11/22/21 0358 11/23/21 0401  NA 138 138 137  K 4.7 3.4* 3.6  CL 106 104 104  CO2 23 27 27   GLUCOSE 153* 118* 162*  BUN 46* 17 24*  CREATININE 1.29* 0.81 0.81  CALCIUM 8.6* 8.3* 8.4*  MG  --  1.7  --    Liver Function Tests: Recent Labs  Lab 11/21/21 1039 11/22/21 0358  AST 24 16  ALT 14 12  ALKPHOS 46 64  BILITOT 0.3 1.0  PROT 5.8* 6.3*  ALBUMIN 2.7* 2.9*   Recent Labs  Lab  11/21/21 1039  LIPASE 24   No results for input(s): AMMONIA in the last 168 hours. CBC: Recent Labs  Lab 11/21/21 1039 11/22/21 0358 11/23/21 0401  WBC 14.2* 8.6 6.0  NEUTROABS 11.5* 6.5 5.3  HGB 15.1 12.6* 13.1  HCT 45.4 38.2* 39.7  MCV 88.3 86.8 86.3  PLT 135* 116*  127*   Cardiac Enzymes: No results for input(s): CKTOTAL, CKMB, CKMBINDEX, TROPONINI in the last 168 hours. BNP: BNP (last 3 results) No results for input(s): BNP in the last 8760 hours.  ProBNP (last 3 results) No results for input(s): PROBNP in the last 8760 hours.  CBG: No results for input(s): GLUCAP in the last 168 hours.     Signed:  Alma Friendly, MD [facility] 11/23/2021, 4:56 PM

## 2021-11-24 ENCOUNTER — Other Ambulatory Visit: Payer: Self-pay | Admitting: Family Medicine

## 2021-11-24 ENCOUNTER — Telehealth: Payer: Self-pay

## 2021-11-24 ENCOUNTER — Telehealth: Payer: Self-pay | Admitting: Family Medicine

## 2021-11-24 MED ORDER — TRAMADOL HCL 50 MG PO TABS: ORAL_TABLET | ORAL | 0 refills | Status: DC

## 2021-11-24 NOTE — Telephone Encounter (Signed)
TOC complete- Patient has hospital follow up 11/29/21 at 11:20am - see TOC call

## 2021-11-24 NOTE — Anesthesia Postprocedure Evaluation (Signed)
Anesthesia Post Note  Patient: Raymond Walker.  Procedure(s) Performed: CYSTOSCOPY WITH RETROGRADE PYELOGRAM/URETERAL STENT PLACEMENT (Right: Ureter)     Patient location during evaluation: PACU Anesthesia Type: General Level of consciousness: awake and alert Pain management: pain level controlled Vital Signs Assessment: post-procedure vital signs reviewed and stable Respiratory status: spontaneous breathing, nonlabored ventilation, respiratory function stable and patient connected to nasal cannula oxygen Cardiovascular status: blood pressure returned to baseline and stable Postop Assessment: no apparent nausea or vomiting Anesthetic complications: no   No notable events documented.  Last Vitals:  Vitals:   11/23/21 0855 11/23/21 1308  BP:  122/60  Pulse:  69  Resp:  18  Temp:  36.8 C  SpO2: 96% 95%    Last Pain:  Vitals:   11/23/21 1308  TempSrc: Oral  PainSc:                  Raymond Walker

## 2021-11-24 NOTE — Telephone Encounter (Signed)
Transition Care Management Follow-up Telephone Call Date of discharge and from where: 11/23/2021 [facility] Diagnosis: Sepsis, kidney stone How have you been since you were released from the hospital? Pt states he is "doing okay." Any questions or concerns? No  Items Reviewed: Did the pt receive and understand the discharge instructions provided? Yes  Medications obtained and verified? Yes  Other? No  Any new allergies since your discharge? No  Dietary orders reviewed? Yes Do you have support at home? Yes   Home Care and Equipment/Supplies: Were home health services ordered? no If so, what is the name of the agency? N/A  Has the agency set up a time to come to the patient's home? not applicable Were any new equipment or medical supplies ordered?  No What is the name of the medical supply agency? N/A Were you able to get the supplies/equipment? not applicable Do you have any questions related to the use of the equipment or supplies? No  Functional Questionnaire: (I = Independent and D = Dependent) ADLs: I  Bathing/Dressing- I  Meal Prep- I  Eating- I  Maintaining continence- I  Transferring/Ambulation- I  Managing Meds- I  Follow up appointments reviewed:  PCP Hospital f/u appt confirmed? Yes  Scheduled to see Dr. Wolfgang Phoenix on 11/29/21 @ 11:20. [facility] f/u appt confirmed? No  Advised patient if he has not heard from Dr. Noland Fordyce office by Monday, to call them. Pt verbalized understanding.  Are transportation arrangements needed? No  If their condition worsens, is the pt aware to call PCP or go to the Emergency Dept.? Yes Was the patient provided with contact information for the PCP's office or ED? Yes Was to pt encouraged to call back with questions or concerns? Yes PT REQUESTS REFILL ON TRAMADOL, IF POSSIBLE.

## 2021-11-24 NOTE — Telephone Encounter (Signed)
I agree if he has ongoing pain or discomfort or if it reoccurs he needs to go to the ER too difficult to figure out what this is via the phone

## 2021-11-24 NOTE — Telephone Encounter (Signed)
Patient was in the hospital Release Had severe sepsis Kidney stone as well It is recommended to do a follow-up later on next week.  Please reach out for transition of care regarding this thank you

## 2021-11-24 NOTE — Telephone Encounter (Signed)
Refill of medication was sent to [facility] thank you

## 2021-11-24 NOTE — Telephone Encounter (Signed)
Called patient to do TOC.  Pt states he has been having intermittent chest pain since, "they took that patch off of me in the hospital." I looked through pt's discharge summary and didn't see anything about a patch. Pt states he is not having pain today. I did advise the patient that I would let you know and if the pain comes back or gets worse, to go to the ER. Pt verbalized understanding of all. Mjp,lpn

## 2021-11-26 LAB — CULTURE, BLOOD (ROUTINE X 2)
Culture: NO GROWTH
Special Requests: ADEQUATE

## 2021-11-27 ENCOUNTER — Other Ambulatory Visit (HOSPITAL_BASED_OUTPATIENT_CLINIC_OR_DEPARTMENT_OTHER): Payer: Self-pay

## 2021-11-27 MED ORDER — METOPROLOL SUCCINATE ER 25 MG PO TB24: 25.0000 mg | ORAL_TABLET | Freq: Every day | ORAL | 3 refills | Status: DC

## 2021-11-29 ENCOUNTER — Ambulatory Visit (INDEPENDENT_AMBULATORY_CARE_PROVIDER_SITE_OTHER): Payer: Medicare Other | Admitting: Family Medicine

## 2021-11-29 ENCOUNTER — Other Ambulatory Visit: Payer: Self-pay

## 2021-11-29 VITALS — BP 110/62 | HR 82 | Temp 97.0°F | Ht 72.0 in | Wt 149.0 lb

## 2021-11-29 DIAGNOSIS — D696 Thrombocytopenia, unspecified: Secondary | ICD-10-CM | POA: Diagnosis not present

## 2021-11-29 DIAGNOSIS — E876 Hypokalemia: Secondary | ICD-10-CM | POA: Diagnosis not present

## 2021-11-29 NOTE — Progress Notes (Signed)
° °  Subjective:    Patient ID: Raymond Walker., male    DOB: 06-22-1951, 70 y.o.   MRN: [id number]  HPI Hospital follow up    Review of Systems     Objective:   Physical Exam        Assessment & Plan:

## 2021-11-29 NOTE — Progress Notes (Signed)
° °  Subjective:    Patient ID: Raymond Walker., male    DOB: 1951/07/14, 70 y.o.   MRN: [id number]  HPI Patient recently in the hospital with a kidney stone and infection Doing better now He accidentally took 2 tramadol's late yesterday evening and it caused him to feel drowsy but he feels better today He states he really does not have anyone who is helping him on a day-to-day basis   Review of Systems     Objective:   Physical Exam General-in no acute distress Eyes-no discharge Lungs-respiratory rate normal, CTA CV-no murmurs,RRR Extremities skin warm dry no edema Neuro grossly normal Behavior normal, alert        Assessment & Plan:  Hospitalization follow-up Stable Continue current measures Follow through with urology Social service consult because patient does not have anyone with him currently helping him to make sure he keeps all his appointments set up straight. He was under the care of a family member but she apparently is no longer in the picture currently Patient does have hypokalemia and thrombocytopenia recommend lab work for rechecking

## 2021-12-04 ENCOUNTER — Other Ambulatory Visit: Payer: Self-pay | Admitting: *Deleted

## 2021-12-04 ENCOUNTER — Telehealth: Payer: Self-pay | Admitting: Family Medicine

## 2021-12-04 DIAGNOSIS — E11621 Type 2 diabetes mellitus with foot ulcer: Secondary | ICD-10-CM

## 2021-12-04 DIAGNOSIS — J441 Chronic obstructive pulmonary disease with (acute) exacerbation: Secondary | ICD-10-CM

## 2021-12-04 DIAGNOSIS — E876 Hypokalemia: Secondary | ICD-10-CM

## 2021-12-04 DIAGNOSIS — R5383 Other fatigue: Secondary | ICD-10-CM

## 2021-12-04 DIAGNOSIS — R911 Solitary pulmonary nodule: Secondary | ICD-10-CM

## 2021-12-05 ENCOUNTER — Telehealth: Payer: Self-pay | Admitting: *Deleted

## 2021-12-05 ENCOUNTER — Inpatient Hospital Stay (HOSPITAL_COMMUNITY)
Admission: EM | Admit: 2021-12-05 | Discharge: 2021-12-12 | DRG: 871 | Disposition: A | Discharge status: Skilled Nursing Facility | Payer: Medicare Other | Attending: Internal Medicine | Admitting: Internal Medicine

## 2021-12-05 ENCOUNTER — Other Ambulatory Visit: Payer: Self-pay

## 2021-12-05 ENCOUNTER — Emergency Department (HOSPITAL_COMMUNITY): Payer: Medicare Other

## 2021-12-05 DIAGNOSIS — Z79899 Other long term (current) drug therapy: Secondary | ICD-10-CM

## 2021-12-05 DIAGNOSIS — R262 Difficulty in walking, not elsewhere classified: Secondary | ICD-10-CM | POA: Diagnosis not present

## 2021-12-05 DIAGNOSIS — I70202 Unspecified atherosclerosis of native arteries of extremities, left leg: Secondary | ICD-10-CM | POA: Diagnosis present

## 2021-12-05 DIAGNOSIS — G9341 Metabolic encephalopathy: Secondary | ICD-10-CM | POA: Diagnosis not present

## 2021-12-05 DIAGNOSIS — Z89411 Acquired absence of right great toe: Secondary | ICD-10-CM | POA: Diagnosis not present

## 2021-12-05 DIAGNOSIS — R509 Fever, unspecified: Secondary | ICD-10-CM

## 2021-12-05 DIAGNOSIS — R5381 Other malaise: Secondary | ICD-10-CM | POA: Diagnosis not present

## 2021-12-05 DIAGNOSIS — Z87891 Personal history of nicotine dependence: Secondary | ICD-10-CM | POA: Diagnosis not present

## 2021-12-05 DIAGNOSIS — E8729 Other acidosis: Secondary | ICD-10-CM | POA: Diagnosis not present

## 2021-12-05 DIAGNOSIS — Z7401 Bed confinement status: Secondary | ICD-10-CM | POA: Diagnosis not present

## 2021-12-05 DIAGNOSIS — R69 Illness, unspecified: Secondary | ICD-10-CM | POA: Diagnosis not present

## 2021-12-05 DIAGNOSIS — J449 Chronic obstructive pulmonary disease, unspecified: Secondary | ICD-10-CM | POA: Diagnosis not present

## 2021-12-05 DIAGNOSIS — Z7951 Long term (current) use of inhaled steroids: Secondary | ICD-10-CM | POA: Diagnosis not present

## 2021-12-05 DIAGNOSIS — N390 Urinary tract infection, site not specified: Secondary | ICD-10-CM | POA: Diagnosis not present

## 2021-12-05 DIAGNOSIS — I499 Cardiac arrhythmia, unspecified: Secondary | ICD-10-CM | POA: Diagnosis not present

## 2021-12-05 DIAGNOSIS — R4182 Altered mental status, unspecified: Secondary | ICD-10-CM | POA: Diagnosis not present

## 2021-12-05 DIAGNOSIS — N12 Tubulo-interstitial nephritis, not specified as acute or chronic: Secondary | ICD-10-CM | POA: Diagnosis not present

## 2021-12-05 DIAGNOSIS — Z89421 Acquired absence of other right toe(s): Secondary | ICD-10-CM

## 2021-12-05 DIAGNOSIS — D72829 Elevated white blood cell count, unspecified: Secondary | ICD-10-CM | POA: Diagnosis not present

## 2021-12-05 DIAGNOSIS — R531 Weakness: Secondary | ICD-10-CM

## 2021-12-05 DIAGNOSIS — Z20822 Contact with and (suspected) exposure to covid-19: Secondary | ICD-10-CM | POA: Diagnosis present

## 2021-12-05 DIAGNOSIS — K219 Gastro-esophageal reflux disease without esophagitis: Secondary | ICD-10-CM | POA: Diagnosis not present

## 2021-12-05 DIAGNOSIS — Z7902 Long term (current) use of antithrombotics/antiplatelets: Secondary | ICD-10-CM | POA: Diagnosis not present

## 2021-12-05 DIAGNOSIS — I7143 Infrarenal abdominal aortic aneurysm, without rupture: Secondary | ICD-10-CM | POA: Diagnosis not present

## 2021-12-05 DIAGNOSIS — R000 Tachycardia, unspecified: Secondary | ICD-10-CM | POA: Diagnosis not present

## 2021-12-05 DIAGNOSIS — Z681 Body mass index (BMI) 19 or less, adult: Secondary | ICD-10-CM

## 2021-12-05 DIAGNOSIS — E872 Acidosis, unspecified: Secondary | ICD-10-CM | POA: Diagnosis not present

## 2021-12-05 DIAGNOSIS — R109 Unspecified abdominal pain: Secondary | ICD-10-CM | POA: Diagnosis not present

## 2021-12-05 DIAGNOSIS — N179 Acute kidney failure, unspecified: Secondary | ICD-10-CM | POA: Diagnosis not present

## 2021-12-05 DIAGNOSIS — R410 Disorientation, unspecified: Secondary | ICD-10-CM | POA: Diagnosis not present

## 2021-12-05 DIAGNOSIS — R1312 Dysphagia, oropharyngeal phase: Secondary | ICD-10-CM | POA: Diagnosis not present

## 2021-12-05 DIAGNOSIS — E782 Mixed hyperlipidemia: Secondary | ICD-10-CM | POA: Diagnosis not present

## 2021-12-05 DIAGNOSIS — R279 Unspecified lack of coordination: Secondary | ICD-10-CM | POA: Diagnosis not present

## 2021-12-05 DIAGNOSIS — I10 Essential (primary) hypertension: Secondary | ICD-10-CM | POA: Diagnosis present

## 2021-12-05 DIAGNOSIS — I739 Peripheral vascular disease, unspecified: Secondary | ICD-10-CM

## 2021-12-05 DIAGNOSIS — E8809 Other disorders of plasma-protein metabolism, not elsewhere classified: Secondary | ICD-10-CM | POA: Diagnosis present

## 2021-12-05 DIAGNOSIS — Z66 Do not resuscitate: Secondary | ICD-10-CM | POA: Diagnosis present

## 2021-12-05 DIAGNOSIS — A419 Sepsis, unspecified organism: Principal | ICD-10-CM | POA: Diagnosis present

## 2021-12-05 DIAGNOSIS — E46 Unspecified protein-calorie malnutrition: Secondary | ICD-10-CM | POA: Diagnosis not present

## 2021-12-05 DIAGNOSIS — N200 Calculus of kidney: Secondary | ICD-10-CM | POA: Diagnosis not present

## 2021-12-05 DIAGNOSIS — R1311 Dysphagia, oral phase: Secondary | ICD-10-CM | POA: Diagnosis not present

## 2021-12-05 DIAGNOSIS — E440 Moderate protein-calorie malnutrition: Secondary | ICD-10-CM | POA: Diagnosis present

## 2021-12-05 DIAGNOSIS — R0902 Hypoxemia: Secondary | ICD-10-CM | POA: Diagnosis not present

## 2021-12-05 DIAGNOSIS — Z8249 Family history of ischemic heart disease and other diseases of the circulatory system: Secondary | ICD-10-CM | POA: Diagnosis not present

## 2021-12-05 DIAGNOSIS — J9811 Atelectasis: Secondary | ICD-10-CM | POA: Diagnosis not present

## 2021-12-05 DIAGNOSIS — R7989 Other specified abnormal findings of blood chemistry: Secondary | ICD-10-CM

## 2021-12-05 DIAGNOSIS — M6281 Muscle weakness (generalized): Secondary | ICD-10-CM | POA: Diagnosis not present

## 2021-12-05 DIAGNOSIS — Z743 Need for continuous supervision: Secondary | ICD-10-CM | POA: Diagnosis not present

## 2021-12-05 LAB — RAPID URINE DRUG SCREEN, HOSP PERFORMED
Amphetamines: NOT DETECTED
Barbiturates: NOT DETECTED
Benzodiazepines: NOT DETECTED
Cocaine: NOT DETECTED
Opiates: NOT DETECTED
Tetrahydrocannabinol: NOT DETECTED

## 2021-12-05 LAB — URINALYSIS, ROUTINE W REFLEX MICROSCOPIC
Glucose, UA: 100 mg/dL — AB
Ketones, ur: NEGATIVE mg/dL
Nitrite: NEGATIVE
Protein, ur: >300 mg/dL — AB
Specific Gravity, Urine: 1.025 (ref 1.005–1.030)
pH: 6.5 (ref 5.0–8.0)

## 2021-12-05 LAB — CBC
HCT: 43.9 % (ref 39.0–52.0)
Hemoglobin: 14.5 g/dL (ref 13.0–17.0)
MCH: 28.4 pg (ref 26.0–34.0)
MCHC: 33 g/dL (ref 30.0–36.0)
MCV: 85.9 fL (ref 80.0–100.0)
Platelets: 275 10*3/uL (ref 150–400)
RBC: 5.11 MIL/uL (ref 4.22–5.81)
RDW: 14.1 % (ref 11.5–15.5)
WBC: 17.2 10*3/uL — ABNORMAL HIGH (ref 4.0–10.5)
nRBC: 0 % (ref 0.0–0.2)

## 2021-12-05 LAB — COMPREHENSIVE METABOLIC PANEL
ALT: 44 U/L (ref 0–44)
AST: 27 U/L (ref 15–41)
Albumin: 2.8 g/dL — ABNORMAL LOW (ref 3.5–5.0)
Alkaline Phosphatase: 80 U/L (ref 38–126)
Anion gap: 10 (ref 5–15)
BUN: 24 mg/dL — ABNORMAL HIGH (ref 8–23)
CO2: 26 mmol/L (ref 22–32)
Calcium: 8.6 mg/dL — ABNORMAL LOW (ref 8.9–10.3)
Chloride: 96 mmol/L — ABNORMAL LOW (ref 98–111)
Creatinine, Ser: 1.31 mg/dL — ABNORMAL HIGH (ref 0.61–1.24)
GFR, Estimated: 58 mL/min — ABNORMAL LOW (ref >60)
Glucose, Bld: 194 mg/dL — ABNORMAL HIGH (ref 70–99)
Potassium: 4 mmol/L (ref 3.5–5.1)
Sodium: 132 mmol/L — ABNORMAL LOW (ref 135–145)
Total Bilirubin: 0.8 mg/dL (ref 0.3–1.2)
Total Protein: 7.8 g/dL (ref 6.5–8.1)

## 2021-12-05 LAB — URINALYSIS, MICROSCOPIC (REFLEX): WBC, UA: >50 WBC/hpf (ref 0–5)

## 2021-12-05 LAB — LACTIC ACID, PLASMA
Lactic Acid, Venous: 2.4 mmol/L (ref 0.5–1.9)
Lactic Acid, Venous: 1.4 mmol/L (ref 0.5–1.9)

## 2021-12-05 LAB — RESP PANEL BY RT-PCR (FLU A&B, COVID) ARPGX2
Influenza A by PCR: NEGATIVE
Influenza B by PCR: NEGATIVE
SARS Coronavirus 2 by RT PCR: NEGATIVE

## 2021-12-05 LAB — ETHANOL: Alcohol, Ethyl (B): <10 mg/dL (ref <10)

## 2021-12-05 MED ORDER — LACTATED RINGERS IV BOLUS
1000.0000 mL | Freq: Once | INTRAVENOUS | Status: AC
  Administered 2021-12-05: 22:00:00 1000 mL via INTRAVENOUS

## 2021-12-05 MED ORDER — SODIUM CHLORIDE 0.9 % IV BOLUS
1000.0000 mL | Freq: Once | INTRAVENOUS | Status: AC
  Administered 2021-12-05: 21:00:00 1000 mL via INTRAVENOUS

## 2021-12-05 MED ORDER — SODIUM CHLORIDE 0.9 % IV SOLN
2.0000 g | Freq: Once | INTRAVENOUS | Status: AC
  Administered 2021-12-05: 23:00:00 2 g via INTRAVENOUS
  Filled 2021-12-05: qty 20

## 2021-12-05 MED ORDER — IOHEXOL 300 MG/ML  SOLN
100.0000 mL | Freq: Once | INTRAMUSCULAR | Status: AC | PRN
  Administered 2021-12-05: 22:00:00 100 mL via INTRAVENOUS

## 2021-12-05 NOTE — ED Provider Notes (Addendum)
[facility] EMERGENCY DEPARTMENT Provider Note   CSN: [id number] Arrival date & time: 12/05/21  2011     History Chief Complaint  Patient presents with   Altered Mental Status    Raymond Walker. is a 70 y.o. male.  Patient with generalized weakness in the past couple days. Patient w recent admission w kidney stones and urosepsis - EMS indicates from history at scene appears possibly not compliant w post admission treatment/plan. Friend went to check on today and was in bed, incontinent, confused, generally weak. Pt appears to c/o right flank pain, but is very poor/limited historian currently - level 5 caveat. No fever. No trauma/fall.   The history is provided by the patient, medical records and the EMS personnel. The history is limited by the condition of the patient.  Altered Mental Status     Past Medical History:  Diagnosis Date   Caregiver with fatigue 05/02/2020   COPD (chronic obstructive pulmonary disease) (HCC)    Deformity    equinus and metatarsal   Erectile dysfunction    Erectile dysfunction 05/02/2020   Full dentures    History of kidney stones    History of seizures 05/31/2020   Between ages 25 and 59   Hypertension    Impaired glucose tolerance    Pancreatitis, recurrent    Pneumonia    Thrombocytopenia ([place]) 08/10/2018   Staying in the low 100s will follow closely    Patient Active Problem List   Diagnosis Date Noted   Kidney stone on right side 11/21/2021   Low back pain 09/19/2021   Cognitive dysfunction 06/23/2020   History of seizures 05/31/2020   Erectile dysfunction 05/02/2020   Caregiver with fatigue 05/02/2020   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[place])    Goals of care, counseling/discussion    Palliative care by specialist    DNR (do not resuscitate)    Vitamin D deficiency 01/24/2020   Sepsis ([place]) 01/23/2020   Altered mental status    Hypokalemia    History of kidney stones    Hematuria    UTI (urinary tract infection)    CHRONIC  Bladder outlet obstruction    PAD (peripheral artery disease) ([place]) 09/15/2019   Protein-calorie malnutrition, severe 08/21/2019   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[place])    Cellulitis of right lower extremity    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[place]) 08/19/2019   Impaired glucose tolerance    COPD (chronic obstructive pulmonary disease) (HCC)    Thrombocytopenia ([place]) 08/10/2018   Aortic atherosclerosis ([place]) 10/14/2016   Coronary artery calcification seen on CAT scan 10/14/2016   Other emphysema ([place]) 10/14/2016   Elevated blood pressure 12/03/2011   Chest pain 12/03/2011   Tobacco abuse 12/03/2011   Right leg pain 12/03/2011    Past Surgical History:  Procedure Laterality Date   ABDOMINAL AORTOGRAM W/LOWER EXTREMITY Bilateral 08/20/2019   Procedure: ABDOMINAL AORTOGRAM W/LOWER EXTREMITY;  Surgeon: Marty Heck, MD;  Location: [place] [facility];  Service: Cardiovascular;  Laterality: Bilateral;   ACHILLES TENDON SURGERY Right 08/26/2020   Procedure: ACHILLES TENDON LENGTHENING;  Surgeon: Criselda Peaches, DPM;  Location: [place];  Service: Podiatry;  Laterality: Right;   AMPUTATION Right 09/15/2019   Procedure: AMPUTATION RIGHT TOES One, Two, And Three;  Surgeon: Waynetta Sandy, MD;  Location: [place];  Service: Vascular;  Laterality: Right;   APPENDECTOMY     CATARACT EXTRACTION W/PHACO Right 05/02/2018   Procedure: CATARACT EXTRACTION PHACO AND INTRAOCULAR LENS PLACEMENT ([place]);  Surgeon: Baruch Goldmann, MD;  Location: [facility];  Service: Ophthalmology;  Laterality: Right;  CDE: 11.11   CATARACT EXTRACTION W/PHACO Left 07/04/2018   Procedure: CATARACT EXTRACTION PHACO AND INTRAOCULAR LENS PLACEMENT (I[facility]);  Surgeon: Baruch Goldmann, MD;  Location: [facility];  Service: Ophthalmology;  Laterality: Left;  CDE: 7.15   CHOLECYSTECTOMY     CYSTOSCOPY W/ URETERAL STENT PLACEMENT Right 11/22/2021   Procedure: CYSTOSCOPY WITH RETROGRADE PYELOGRAM/URETERAL STENT PLACEMENT;  Surgeon:  Franchot Gallo, MD;  Location: [facility];  Service: Urology;  Laterality: Right;   FEMORAL-POPLITEAL BYPASS GRAFT Right 09/15/2019   Procedure: Right BYPASS GRAFT FEMORAL to Above Knee POPLITEAL ARTERY;  Surgeon: Waynetta Sandy, MD;  Location: [place];  Service: Vascular;  Laterality: Right;   FEMORAL-POPLITEAL BYPASS GRAFT Right 01/26/2020   Procedure: IRRIGATION AND DEBRIDEMENT RIGHT FEMORAL POPLITEAL BYPASS SITE;  Surgeon: Waynetta Sandy, MD;  Location: [place];  Service: Vascular;  Laterality: Right;   MULTIPLE TOOTH EXTRACTIONS     ORCHIECTOMY     PERIPHERAL VASCULAR INTERVENTION Left 08/20/2019   Procedure: PERIPHERAL VASCULAR INTERVENTION;  Surgeon: Marty Heck, MD;  Location: [place] [facility];  Service: Cardiovascular;  Laterality: Left;  common/external iliac   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   Procedure: TRANSMETATARSAL AMPUTATION;  Surgeon: Waynetta Sandy, MD;  Location: [place];  Service: Vascular;  Laterality: Right;   TRANSMETATARSAL AMPUTATION Right 08/26/2020   Procedure: TRANSMETATARSAL AMPUTATION REVISION;  Surgeon: Criselda Peaches, DPM;  Location: [place];  Service: Podiatry;  Laterality: Right;   VASECTOMY     WOUND DEBRIDEMENT Right 08/26/2020   Procedure: EXCISION WOUND;  Surgeon: Criselda Peaches, DPM;  Location: [place];  Service: Podiatry;  Laterality: Right;       Family History  Problem Relation Age of Onset   Hypertension Father    Coronary artery disease Father    Coronary artery disease Mother    Cancer Mother        Breast    Social History   Tobacco Use   Smoking status: Former    Packs/day: 1.00    Years: 30.00    Pack years: 30.00    Types: Cigarettes   Smokeless tobacco: Former    Types: Nurse, children's Use: Never used  Substance Use Topics   Alcohol use: No   Drug use: No    Home Medications Prior to Admission medications   Medication Sig Start Date End Date Taking? Authorizing  Provider  albuterol (VENTOLIN HFA) 108 (90 Base) MCG/ACT inhaler INHALE 2 PUFFS INTO THE LUNGS EVERY 4 HOURS AS NEEDED FOR WHEEZING OR SHORTNESS OF BREATH 08/09/21   [name], MD  clopidogrel (PLAVIX) 75 MG tablet TAKE 1 TABLET(75 MG) BY MOUTH DAILY WITH BREAKFAST Patient taking differently: Take 75 mg by mouth daily. TAKE 1 TABLET(75 MG) BY MOUTH DAILY WITH BREAKFAST 09/20/21   [name], MD  metoprolol succinate (TOPROL XL) 25 MG 24 hr tablet Take 1 tablet (25 mg total) by mouth daily. 11/27/21   Schuman, Stefanie Libel, MD  mometasone-formoterol (DULERA) 200-5 MCG/ACT AERO Inhale 2 puffs into the lungs 2 (two) times daily. 11/23/21   Alma Friendly, MD  pantoprazole (PROTONIX) 40 MG tablet TAKE 1 TABLET(40 MG) BY MOUTH DAILY 07/26/21   [name], MD  rosuvastatin (CRESTOR) 20 MG tablet TAKE 1 TABLET(20 MG) BY MOUTH DAILY 07/26/21   [name], MD  sildenafil (VIAGRA) 100 MG tablet Take 0.5-1 tablets (50-100  mg total) by mouth daily as needed for erectile dysfunction. Patient not taking: Reported on 11/03/2021 05/29/21   [name], MD  sucralfate (CARAFATE) 1 g tablet TAKE 1 TABLET(1 GRAM) BY MOUTH FOUR TIMES DAILY AT BEDTIME WITH MEALS Patient not taking: Reported on 11/21/2021 10/04/21   [name], MD  traMADol (ULTRAM) 50 MG tablet TAKE 1 TABLET(50 MG) BY MOUTH DAILY AS NEEDED FOR MODERATE PAIN 11/24/21   [name], MD    Allergies    Patient has no known allergies.  Review of Systems   Review of Systems  Unable to perform ROS: Mental status change  Level 5 caveat - altered mental status.    Physical Exam Updated Vital Signs There were no vitals taken for this visit.  Physical Exam Vitals and nursing note reviewed.  Constitutional:      Appearance: Normal appearance. He is well-developed.  HENT:     Head: Atraumatic.     Nose: Nose normal.     Mouth/Throat:     Mouth: Mucous membranes are moist.     Pharynx: Oropharynx is clear.  Eyes:      General: No scleral icterus.    Conjunctiva/sclera: Conjunctivae normal.     Pupils: Pupils are equal, round, and reactive to light.  Neck:     Vascular: No carotid bruit.     Trachea: No tracheal deviation.     Comments: No stiffness or rigidity Cardiovascular:     Rate and Rhythm: Normal rate and regular rhythm.     Pulses: Normal pulses.     Heart sounds: Normal heart sounds. No murmur heard.   No friction rub. No gallop.  Pulmonary:     Effort: Pulmonary effort is normal. No accessory muscle usage or respiratory distress.     Breath sounds: Normal breath sounds.  Abdominal:     General: Bowel sounds are normal. There is no distension.     Palpations: Abdomen is soft.     Tenderness: There is abdominal tenderness. There is no guarding.     Comments: Right abd tenderness.   Genitourinary:    Comments: No cva tenderness. Normal external gu exam.  Musculoskeletal:        General: No swelling.     Cervical back: Normal range of motion and neck supple. No rigidity.  Skin:    General: Skin is warm and dry.     Findings: No rash.  Neurological:     Mental Status: He is alert.     Comments: Alert, speech slow to respond, limited, but not grossly dysarthric. Motor/sens grossly intact bil, moves bilateral extremities purposefully.   Psychiatric:     Comments: Weak/confused.     ED Results / Procedures / Treatments   Labs (all labs ordered are listed, but only abnormal results are displayed) Results for orders placed or performed during the hospital encounter of 12/05/21  Blood culture (routine x 2)   Specimen: BLOOD LEFT FOREARM  Result Value Ref Range   Specimen Description BLOOD LEFT FOREARM    Special Requests      BOTTLES DRAWN AEROBIC AND ANAEROBIC Blood Culture adequate volume Performed at [facility], [address] [place], [place] [address]    Culture PENDING    Report Status PENDING   Blood culture (routine x 2)   Specimen: Right Antecubital; Blood  Result  Value Ref Range   Specimen Description RIGHT ANTECUBITAL    Special Requests      BOTTLES DRAWN AEROBIC AND  ANAEROBIC Blood Culture results may not be optimal due to an inadequate volume of blood received in culture bottles Performed at [facility], [address], [place], [place] [address]    Culture PENDING    Report Status PENDING   Resp Panel by RT-PCR (Flu A&B, Covid) Nasopharyngeal Swab   Specimen: Nasopharyngeal Swab; Nasopharyngeal(NP) swabs in vial transport medium  Result Value Ref Range   SARS Coronavirus 2 by RT PCR NEGATIVE NEGATIVE   Influenza A by PCR NEGATIVE NEGATIVE   Influenza B by PCR NEGATIVE NEGATIVE  CBC  Result Value Ref Range   WBC 17.2 (H) 4.0 - 10.5 K/uL   RBC 5.11 4.22 - 5.81 MIL/uL   Hemoglobin 14.5 13.0 - 17.0 g/dL   HCT 43.9 39.0 - 52.0 %   MCV 85.9 80.0 - 100.0 fL   MCH 28.4 26.0 - 34.0 pg   MCHC 33.0 30.0 - 36.0 g/dL   RDW 14.1 11.5 - 15.5 %   Platelets 275 150 - 400 K/uL   nRBC 0.0 0.0 - 0.2 %  Comprehensive metabolic panel  Result Value Ref Range   Sodium 132 (L) 135 - 145 mmol/L   Potassium 4.0 3.5 - 5.1 mmol/L   Chloride 96 (L) 98 - 111 mmol/L   CO2 26 22 - 32 mmol/L   Glucose, Bld 194 (H) 70 - 99 mg/dL   BUN 24 (H) 8 - 23 mg/dL   Creatinine, Ser 1.31 (H) 0.61 - 1.24 mg/dL   Calcium 8.6 (L) 8.9 - 10.3 mg/dL   Total Protein 7.8 6.5 - 8.1 g/dL   Albumin 2.8 (L) 3.5 - 5.0 g/dL   AST 27 15 - 41 U/L   ALT 44 0 - 44 U/L   Alkaline Phosphatase 80 38 - 126 U/L   Total Bilirubin 0.8 0.3 - 1.2 mg/dL   GFR, Estimated 58 (L) >60 mL/min   Anion gap 10 5 - 15  Urinalysis, Routine w reflex microscopic Urine, Clean Catch  Result Value Ref Range   Color, Urine YELLOW YELLOW   APPearance CLEAR CLEAR   Specific Gravity, Urine 1.025 1.005 - 1.030   pH 6.5 5.0 - 8.0   Glucose, UA 100 (A) NEGATIVE mg/dL   Hgb urine dipstick LARGE (A) NEGATIVE   Bilirubin Urine SMALL (A) NEGATIVE   Ketones, ur NEGATIVE NEGATIVE mg/dL   Protein, ur >300 (A) NEGATIVE  mg/dL   Nitrite NEGATIVE NEGATIVE   Leukocytes,Ua MODERATE (A) NEGATIVE  Lactic acid, plasma  Result Value Ref Range   Lactic Acid, Venous 2.4 (HH) 0.5 - 1.9 mmol/L  Ethanol  Result Value Ref Range   Alcohol, Ethyl (B) <10 <10 mg/dL  Urinalysis, Microscopic (reflex)  Result Value Ref Range   RBC / HPF 21-50 0 - 5 RBC/hpf   WBC, UA >50 0 - 5 WBC/hpf   Bacteria, UA MANY (A) NONE SEEN   Squamous Epithelial / LPF 6-10 0 - 5   CT Head Wo Contrast  Result Date: 11/21/2021 CLINICAL DATA:  Dizziness EXAM: CT HEAD WITHOUT CONTRAST TECHNIQUE: Contiguous axial images were obtained from the base of the skull through the vertex without intravenous contrast. COMPARISON:  Brain MRI 11/06/2019, CT head 01/23/2020 FINDINGS: Brain: There is no evidence of acute intracranial hemorrhage, extra-axial fluid collection, or acute infarct. There is mild parenchymal volume loss. The ventricles are normal in size. There is no significant burden of white matter microangiopathy. There is no solid mass lesion.  There is no midline shift. Vascular: There is  calcification of the bilateral cavernous ICAs. Skull: Normal. Negative for fracture or focal lesion. Sinuses/Orbits: The paranasal sinuses are clear. Bilateral lens implants are in place. The globes and orbits are otherwise unremarkable. Other: None. IMPRESSION: No acute intracranial pathology. Electronically Signed   By: Valetta Mole M.D.   On: 11/21/2021 15:26   CT ABDOMEN PELVIS W CONTRAST  Result Date: 11/21/2021 CLINICAL DATA:  Nausea, vomiting, dizziness EXAM: CT ABDOMEN AND PELVIS WITH CONTRAST TECHNIQUE: Multidetector CT imaging of the abdomen and pelvis was performed using the standard protocol following bolus administration of intravenous contrast. CONTRAST:  117mL OMNIPAQUE IOHEXOL 300 MG/ML  SOLN COMPARISON:  CT abdomen/pelvis 11/30/2019 FINDINGS: Lower chest: There is severe emphysema in the lung bases with essentially no lung markings in the anterior portion  of the left lung base, similar to prior studies. The imaged heart is unremarkable. Hepatobiliary: The liver is diffusely hypoattenuating likely reflecting fatty infiltration. The gallbladder is surgically absent. There is no biliary ductal dilatation. Pancreas: The pancreatic body is atrophic, unchanged. There are no focal lesions or contour abnormalities. There is no main pancreatic ductal dilatation or peripancreatic inflammatory change. Spleen: A few punctate calcified granulomas are seen in the spleen. The spleen is otherwise unremarkable. Adrenals/Urinary Tract: The adrenals are unremarkable. An irregular somewhat thick-walled cyst with calcifications along the posterior wall in the left interpolar region associated with an area of marked cortical scarring measuring approximally 3.2 cm x 2.8 cm is not significantly changed going back to 2018. Additional renal cysts measuring up to 3.8 cm in the left lower pole are again seen, not significantly changed. Numerous nonobstructing calculi are seen in both kidneys measuring up to 8 mm in the left lower pole. There are multiple stones in the distal right ureter measuring up to 1.1 cm proximal to the UVJ, new since 11/30/2019. There is no upstream hydroureteronephrosis. There is no left hydroureteronephrosis. No stones are seen in the left ureter. The bladder wall is thickened suggesting chronic outlet obstruction due to the enlarged prostate. Stomach/Bowel: The stomach is unremarkable. There is no evidence of bowel obstruction. There is no abnormal bowel wall thickening or inflammatory change. The appendix is normal. Vascular/Lymphatic: There is extensive soft and calcified atherosclerotic plaque in the nonaneurysmal abdominal aorta which measures up to 2.6 cm in maximum diameter. A right iliac stent graft is in place. The excluded aneurysmal common iliac artery measures up to 2.0 cm in maximum dimension, unchanged since 2020. There is a presumed right superficial  femoral artery stent in place. The artery is occluded just after the bifurcation, new since 11/30/2019 (3-81). The profunda femoral artery is patent to the level imaged. There is a focal dissection in the left common femoral artery, unchanged since 2020 (3-68). The left superficial femoral artery is occluded to the level imaged, unchanged since 2020. The imaged left deep femoral artery is patent. There is no abdominal or pelvic lymphadenopathy. Reproductive: The prostate is enlarged measuring up to 5.7 cm. The seminal vesicles are unremarkable. Other: There is no ascites or free air. Musculoskeletal: There is no acute osseous abnormality or aggressive osseous lesion. IMPRESSION: 1. Multiple distal right ureteral stones measuring up to 1.0 cm without upstream hydroureteronephrosis. 2. Numerous additional nonobstructing bilateral stones as above. 3. Thick-walled left renal cyst with calcifications along the posterior wall, unchanged going back to at least 2018. 4. Occluded right superficial femoral artery graft shortly after the bifurcation, new since 11/30/2019. The deep femoral artery is patent to the level imaged. 5. Occluded left  superficial femoral artery to the level imaged, unchanged since 2020. 6. Stable postprocedural changes reflecting right common iliac artery stent graft with unchanged size of the excluded sac measuring 2.0 cm. 7. Bladder wall thickening suggesting chronic outlet obstructing due to the enlarged prostate. 8. Aortic Atherosclerosis (ICD10-I70.0) and severe emphysema (ICD10-J43.9). Electronically Signed   By: Valetta Mole M.D.   On: 11/21/2021 14:59   DG Chest Portable 1 View  Result Date: 11/21/2021 CLINICAL DATA:  Dyspnea fall, dizziness EXAM: PORTABLE CHEST 1 VIEW COMPARISON:  Chest radiograph 07/26/2021 FINDINGS: The cardiomediastinal silhouette is stable. Coarsened interstitial markings are again seen in both lungs on a background of hyperlucency likely reflecting scarring and COPD.  Findings are overall similar to the prior study. There is no new or worsening focal airspace disease. There is no significant pleural effusion. There is no pneumothorax. The bones are stable. IMPRESSION: Stable findings of COPD. No evidence of acute cardiopulmonary process. Electronically Signed   By: Valetta Mole M.D.   On: 11/21/2021 11:20   DG C-Arm 1-60 Min-No Report  Result Date: 11/22/2021 Fluoroscopy was utilized by the requesting physician.  No radiographic interpretation.   LONG TERM MONITOR (3-14 DAYS)  Result Date: 11/26/2021  1 episode of NSVT lasting 11 beats  350 episodes of SVT, longest lasting 3 minutes 14 seconds with average rate 108 bpm  Frequent PACs (6.5% of beats)  Patch Wear Time:  2 days and 1 hours (2022-11-18T12:08:58-0500 to 2022-11-20T14:00:56-0500) Patient had a min HR of 47 bpm, max HR of 174 bpm, and avg HR of 81 bpm. Predominant underlying rhythm was Sinus Rhythm. 1 run of Ventricular Tachycardia occurred lasting 11 beats with a max rate of 169 bpm (avg 152 bpm). 350 Supraventricular Tachycardia  runs occurred, the run with the fastest interval lasting 5 beats with a max rate of 174 bpm, the longest lasting 3 mins 14 secs with an avg rate of 108 bpm. Some episodes of Supraventricular Tachycardia may be possible Atrial Tachycardia with variable block. Supraventricular Tachycardia was detected within +/- 45 seconds of symptomatic patient event(s). Isolated SVEs were frequent (6.5%, 15550), SVE Couplets were rare (<1.0%, 1140), and SVE Triplets were rare (<1.0%, 447). Isolated VEs were rare (<1.0%), VE Couplets were rare (<1.0%), and no VE Triplets were present. Ventricular Bigeminy was present.  4 patient triggered events, corresponding to sinus rhythm with PACs/PVCs    EKG EKG Interpretation  Date/Time:  Tuesday December 05 2021 20:36:15 EST Ventricular Rate:  121 PR Interval:  172 QRS Duration: 79 QT Interval:  307 QTC Calculation: 436 R Axis:   87 Text  Interpretation: Sinus tachycardia Baseline wander Confirmed by Lajean Saver [phone]) on 12/05/2021 8:41:49 PM  Radiology DG Chest Port 1 View  Result Date: 12/05/2021 CLINICAL DATA:  Altered mental status and weakness. EXAM: PORTABLE CHEST 1 VIEW COMPARISON:  November 21, 2021 FINDINGS: The lungs are hyperinflated. Mild atelectasis is seen along the lateral aspect of the mid right lung. There is no evidence of acute infiltrate, pleural effusion or pneumothorax. The heart size and mediastinal contours are within normal limits. The visualized skeletal structures are unremarkable. IMPRESSION: No active disease. Electronically Signed   By: Virgina Norfolk M.D.   On: 12/05/2021 21:10    Procedures Procedures   Medications Ordered in ED Medications  sodium chloride 0.9 % bolus 1,000 mL (has no administration in time range)    ED Course  I have reviewed the triage vital signs and the nursing notes.  Pertinent labs & imaging  results that were available during my care of the patient were reviewed by me and considered in my medical decision making (see chart for details).    MDM Rules/Calculators/A&P                         Iv ns bolus. Stat labs and cultures. Imaging ordered.   Reviewed nursing notes and prior charts for additional history. Recent inpatient admission reviewed.  Labs reviewed/interpreted by me - wbc high. Ua with many wbc, urine culture. Rocephin 2 gm iv.   CXR reviewed/interpreted by me - no pna.   Repeat lactate pending.   Additional ivf.   CT pending.   Hospitalists consulted for admission.  CRITICAL CARE RE: acute uti with urosepsis/elevated lactate, tachycardia, weakness, altered ms.  Performed by: Mirna Mires Total critical care time: 45 minutes Critical care time was exclusive of separately billable procedures and treating other patients. Critical care was necessary to treat or prevent imminent or life-threatening deterioration. Critical care was time  spent personally by me on the following activities: development of treatment plan with patient and/or surrogate as well as nursing, discussions with consultants, evaluation of patient's response to treatment, examination of patient, obtaining history from patient or surrogate, ordering and performing treatments and interventions, ordering and review of laboratory studies, ordering and review of radiographic studies, pulse oximetry and re-evaluation of patient's condition.   CT reviewed/interpreted by me  - ureteral stent, no hydro, +stones. ?right pyelo.      Final Clinical Impression(s) / ED Diagnoses Final diagnoses:  None    Rx / DC Orders ED Discharge Orders     None         Lajean Saver, MD 12/05/21 2249

## 2021-12-05 NOTE — Chronic Care Management (AMB) (Signed)
°  Chronic Care Management   Outreach Note  12/05/2021 Name: Raymond Walker. MRN: [id number] DOB: 12-15-1951  Raymond Walker. is a 70 y.o. year old male who is a primary care patient of Luking, Elayne Snare, MD. I reached out to [name] by phone today in response to a referral sent by [name]'s primary care provider.  An unsuccessful telephone outreach was attempted today. The patient was referred to the case management team for assistance with care management and care coordination.   Follow Up Plan: A HIPAA compliant phone message was left for the patient providing contact information and requesting a return call.  If patient returns call to provider office, please advise to call Embedded Care Management Care Guide Bryne Lindon at [place]  Direct Dial: [phone]

## 2021-12-05 NOTE — H&P (Addendum)
History and Physical  Raymond Walker. KYH:[id number] DOB: 26-Jun-1951 DOA: 12/05/2021  Referring physician: Lajean Saver, MD  PCP: [name], MD  Patient coming from: Home  Chief Complaint: Altered mental status  HPI: Raymond Walker. is a 70 y.o. male with medical history significant for COPD, PAD, prior history of kidney stones who presents to the emergency department via EMS due to altered mental status.  Patient was unable to provide history, history was obtained from ED physician and ED medical record.  Per report, patient was recently admitted from 12/6-12/8 due to sepsis secondary to UTI with CT showing numerous obstructing kidney stones on the right status post cystoscopy and placement of double-J stent in the right ureter.  5 days of oral Keflex and follow-up as an outpatient for elective management of multiple kidney stones was advised.  It appeared that patient was not compliant with post admission treatment/plan.  A friend went to check on him today and he was in bed confused, incontinent and weak, he complained of right flank pain.  EMS was activated and patient was taken to the ED for further evaluation and management. Patient lives at home alone and able to perform IADLs at baseline.  ED Course:  In the emergency department, he was febrile with a temperature of 101.63F, tachypneic and intermittently tachycardic.  BP was soft at 100/45.  Work-up in the ED showed normal CBC except leukocytosis, BMP showed hyponatremia, BUN/creatinine 24/1.31 (baseline creatinine of 0.8-0.9).  Albumin 2.8, lactic acid 2.4  > 1.4.  Urinalysis was positive for hematuria.  Influenza A, B, SARS coronavirus 2 was negative. Chest x-ray showed no active disease CT abdomen and pelvis with contrast was suggestive of pyelonephritis Patient was started on IV ceftriaxone, IV hydration was provided per sepsis protocol.  Hospitalist was asked to admit patient for further evaluation and management.  Review  of Systems: Constitutional: Negative for chills and fever.  HENT: Negative for ear pain and sore throat.   Eyes: Negative for pain and visual disturbance.  Respiratory: Negative for cough, chest tightness and shortness of breath.   Cardiovascular: Negative for chest pain and palpitations.  Gastrointestinal: Negative for abdominal pain and vomiting.  Endocrine: Negative for polyphagia and polyuria.  Genitourinary: Negative for decreased urine volume, dysuria Musculoskeletal: Positive for right flank pain.  Negative for arthralgias Skin: Negative for color change and rash.  Allergic/Immunologic: Negative for immunocompromised state.  Neurological: Positive for weakness.  Negative for tremors, syncope, speech difficulty Hematological: Does not bruise/bleed easily.  All other systems reviewed and are negative  Past Medical History:  Diagnosis Date   Caregiver with fatigue 05/02/2020   COPD (chronic obstructive pulmonary disease) (HCC)    Deformity    equinus and metatarsal   Erectile dysfunction    Erectile dysfunction 05/02/2020   Full dentures    History of kidney stones    History of seizures 05/31/2020   Between ages 65 and 32   Hypertension    Impaired glucose tolerance    Pancreatitis, recurrent    Pneumonia    Thrombocytopenia ([place]) 08/10/2018   Staying in the low 100s will follow closely   Past Surgical History:  Procedure Laterality Date   ABDOMINAL AORTOGRAM W/LOWER EXTREMITY Bilateral 08/20/2019   Procedure: ABDOMINAL AORTOGRAM W/LOWER EXTREMITY;  Surgeon: Marty Heck, MD;  Location: [facility];  Service: Cardiovascular;  Laterality: Bilateral;   ACHILLES TENDON SURGERY Right 08/26/2020   Procedure: ACHILLES TENDON LENGTHENING;  Surgeon: Criselda Peaches,  DPM;  Location: [facility];  Service: Podiatry;  Laterality: Right;   AMPUTATION Right 09/15/2019   Procedure: AMPUTATION RIGHT TOES One, Two, And Three;  Surgeon: Waynetta Sandy, MD;  Location: [facility];   Service: Vascular;  Laterality: Right;   APPENDECTOMY     CATARACT EXTRACTION W/PHACO Right 05/02/2018   Procedure: CATARACT EXTRACTION PHACO AND INTRAOCULAR LENS PLACEMENT ([place]);  Surgeon: Baruch Goldmann, MD;  Location: [facility];  Service: Ophthalmology;  Laterality: Right;  CDE: 11.11   CATARACT EXTRACTION W/PHACO Left 07/04/2018   Procedure: CATARACT EXTRACTION PHACO AND INTRAOCULAR LENS PLACEMENT ([facility]);  Surgeon: Baruch Goldmann, MD;  Location: [facility];  Service: Ophthalmology;  Laterality: Left;  CDE: 7.15   CHOLECYSTECTOMY     CYSTOSCOPY W/ URETERAL STENT PLACEMENT Right 11/22/2021   Procedure: CYSTOSCOPY WITH RETROGRADE PYELOGRAM/URETERAL STENT PLACEMENT;  Surgeon: Franchot Gallo, MD;  Location: [facility];  Service: Urology;  Laterality: Right;   FEMORAL-POPLITEAL BYPASS GRAFT Right 09/15/2019   Procedure: Right BYPASS GRAFT FEMORAL to Above Knee POPLITEAL ARTERY;  Surgeon: Waynetta Sandy, MD;  Location: [facility];  Service: Vascular;  Laterality: Right;   FEMORAL-POPLITEAL BYPASS GRAFT Right 01/26/2020   Procedure: IRRIGATION AND DEBRIDEMENT RIGHT FEMORAL POPLITEAL BYPASS SITE;  Surgeon: Waynetta Sandy, MD;  Location: [facility];  Service: Vascular;  Laterality: Right;   MULTIPLE TOOTH EXTRACTIONS     ORCHIECTOMY     PERIPHERAL VASCULAR INTERVENTION Left 08/20/2019   Procedure: PERIPHERAL VASCULAR INTERVENTION;  Surgeon: Marty Heck, MD;  Location: [facility];  Service: Cardiovascular;  Laterality: Left;  common/external iliac   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   Procedure: TRANSMETATARSAL AMPUTATION;  Surgeon: Waynetta Sandy, MD;  Location: [facility];  Service: Vascular;  Laterality: Right;   TRANSMETATARSAL AMPUTATION Right 08/26/2020   Procedure: TRANSMETATARSAL AMPUTATION REVISION;  Surgeon: Criselda Peaches, DPM;  Location: [facility];  Service: Podiatry;  Laterality: Right;   VASECTOMY     WOUND DEBRIDEMENT Right 08/26/2020   Procedure: EXCISION WOUND;   Surgeon: Criselda Peaches, DPM;  Location: [facility];  Service: Podiatry;  Laterality: Right;    Social History:  reports that he has quit smoking. His smoking use included cigarettes. He has a 30.00 pack-year smoking history. He has quit using smokeless tobacco.  His smokeless tobacco use included chew. He reports that he does not drink alcohol and does not use drugs.   No Known Allergies  Family History  Problem Relation Age of Onset   Hypertension Father    Coronary artery disease Father    Coronary artery disease Mother    Cancer Mother        Breast    Prior to Admission medications   Medication Sig Start Date End Date Taking? Authorizing Provider  albuterol (VENTOLIN HFA) 108 (90 Base) MCG/ACT inhaler INHALE 2 PUFFS INTO THE LUNGS EVERY 4 HOURS AS NEEDED FOR WHEEZING OR SHORTNESS OF BREATH 08/09/21   [name], MD  clopidogrel (PLAVIX) 75 MG tablet TAKE 1 TABLET(75 MG) BY MOUTH DAILY WITH BREAKFAST Patient taking differently: Take 75 mg by mouth daily. TAKE 1 TABLET(75 MG) BY MOUTH DAILY WITH BREAKFAST 09/20/21   [name], MD  metoprolol succinate (TOPROL XL) 25 MG 24 hr tablet Take 1 tablet (25 mg total) by mouth daily. 11/27/21   Schuman, Stefanie Libel, MD  mometasone-formoterol (DULERA) 200-5 MCG/ACT AERO Inhale 2 puffs into the lungs 2 (two) times daily. 11/23/21   Alma Friendly, MD  pantoprazole (PROTONIX) 40  MG tablet TAKE 1 TABLET(40 MG) BY MOUTH DAILY 07/26/21   [name], MD  rosuvastatin (CRESTOR) 20 MG tablet TAKE 1 TABLET(20 MG) BY MOUTH DAILY 07/26/21   [name], MD  sildenafil (VIAGRA) 100 MG tablet Take 0.5-1 tablets (50-100 mg total) by mouth daily as needed for erectile dysfunction. Patient not taking: Reported on 11/03/2021 05/29/21   [name], MD  sucralfate (CARAFATE) 1 g tablet TAKE 1 TABLET(1 GRAM) BY MOUTH FOUR TIMES DAILY AT BEDTIME WITH MEALS Patient not taking: Reported on 11/21/2021 10/04/21   [name], MD  traMADol  (ULTRAM) 50 MG tablet TAKE 1 TABLET(50 MG) BY MOUTH DAILY AS NEEDED FOR MODERATE PAIN 11/24/21   [name], MD    Physical Exam: BP (!) 110/52 (BP Location: Left Arm)    Pulse 69    Temp 98.1 F (36.7 C) (Oral)    Resp 18    Ht 6' (1.829 m)    Wt 64.8 kg    SpO2 97%    BMI 19.38 kg/m   General: 70 y.o. year-old male well developed ill-appearing but in no acute distress.  Alert and oriented x3. HEENT: NCAT, EOMI Neck: Supple, trachea medial Cardiovascular: Regular rate and rhythm with no rubs or gallops.  No thyromegaly or JVD noted.  No lower extremity edema. 2/4 pulses in all 4 extremities. Respiratory: Tachypnea.  Clear to auscultation with no wheezes or rales. Good inspiratory effort. Abdomen: Soft, nontender nondistended with normal bowel sounds x4 quadrants. Muskuloskeletal: Right transmetatarsal amputation. No cyanosis or clubbing  Neuro: CN II-XII intact, sensation, reflexes intact Skin: No ulcerative lesions noted or rashes Psychiatry:Mood is appropriate for condition and setting          Labs on Admission:  Basic Metabolic Panel: Recent Labs  Lab 12/05/21 2036  NA 132*  K 4.0  CL 96*  CO2 26  GLUCOSE 194*  BUN 24*  CREATININE 1.31*  CALCIUM 8.6*   Liver Function Tests: Recent Labs  Lab 12/05/21 2036  AST 27  ALT 44  ALKPHOS 80  BILITOT 0.8  PROT 7.8  ALBUMIN 2.8*   No results for input(s): LIPASE, AMYLASE in the last 168 hours. No results for input(s): AMMONIA in the last 168 hours. CBC: Recent Labs  Lab 12/05/21 2036  WBC 17.2*  HGB 14.5  HCT 43.9  MCV 85.9  PLT 275   Cardiac Enzymes: No results for input(s): CKTOTAL, CKMB, CKMBINDEX, TROPONINI in the last 168 hours.  BNP (last 3 results) No results for input(s): BNP in the last 8760 hours.  ProBNP (last 3 results) No results for input(s): PROBNP in the last 8760 hours.  CBG: No results for input(s): GLUCAP in the last 168 hours.  Radiological Exams on Admission: CT Abdomen Pelvis  W Contrast  Result Date: 12/05/2021 CLINICAL DATA:  Right lower quadrant abdominal pain EXAM: CT ABDOMEN AND PELVIS WITH CONTRAST TECHNIQUE: Multidetector CT imaging of the abdomen and pelvis was performed using the standard protocol following bolus administration of intravenous contrast. CONTRAST:  172m OMNIPAQUE IOHEXOL 300 MG/ML  SOLN COMPARISON:  11/21/2021 FINDINGS: Lower chest: Emphysematous changes are noted within the lung bases. Cardiac size within normal limits. Hepatobiliary: Focal fatty sparing within the gallbladder fossa. No enhancing intrahepatic mass identified. Status post cholecystectomy. No biliary dilatation. Pancreas: Unremarkable Spleen: Unremarkable Adrenals/Urinary Tract: The adrenal glands are unremarkable. The kidneys are normal in position. There is asymmetric cortical atrophy involving the upper pole the left kidney. There is an associated  irregular, thick-walled cystic lesion within the upper pole demonstrating lateral mural calcification which appears unchanged from remote prior examination of 06/23/2017 is likely benign given its stability over time. Simple cortical cyst noted within the lower pole of the left kidney and upper pole the right kidney. Multiple nonobstructing renal calculi are noted within the kidneys bilaterally, stable since prior examination. Right double-J ureteral stent has been placed in the interval since prior examination extending from the right renal pelvis to the bladder. Previously noted trace hydronephrosis has resolved. There has developed mild right perinephric stranding which tracks into the right posterior pararenal space which may be postprocedural in nature or relate to superimposed inflammation as can be seen with pyelonephritis. Stable 5 and 14 mm calculi within the distal right ureter. No ureteral calculi on the left. No hydronephrosis on the left. The bladder is mildly thick walled suggesting changes of chronic bladder outlet obstruction,  unchanged from multiple prior examinations. The bladder is not distended Stomach/Bowel: Stomach is within normal limits. Appendix appears normal. No evidence of bowel wall thickening, distention, or inflammatory changes. Vascular/Lymphatic: 3.0 cm infrarenal fusiform abdominal aortic aneurysm demonstrating moderate mural thrombus is again identified, stable since immediate prior examination and increased since remote prior examination of 06/23/2017 where this measured 2.6 cm. Right common iliac artery stent grafting has been performed with thrombosis of the excluded right common iliac artery aneurysm sac. Right external iliac artery has been stented. Thrombosis of a right common femoral-distal bypass graft is partially visualized. Thrombosis of right superficial femoral arteries partially visualized. Ectasia of left common iliac artery is unchanged. 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 No pathologic adenopathy within the abdomen and pelvis. Reproductive: Moderate prostatic enlargement with indentation of the bladder base is again noted. Other: No abdominal wall hernia.  Rectum unremarkable. Musculoskeletal: No acute bone abnormality. No lytic or blastic bone lesion. IMPRESSION: Interval right double-J ureteral stent placement with resolution of previously noted right hydronephrosis. Right ureteral stones are unchanged in position, however, measuring up to 14 mm within the distal right ureter. There has developed mild right perinephric inflammatory stranding which may be postprocedural in nature or relate to a superimposed inflammatory process such as pyelonephritis. Correlation with urinalysis may be helpful. Stable bilateral nonobstructing nephrolithiasis. Unchanged complex cystic lesion within the upper pole the left kidney, stable since remote prior examination of 06/23/2017. Emphysema. 3 cm infrarenal abdominal aortic aneurysm. Recommend follow-up every 3 years.  Reference: J Am Coll Radiol 4627;03:500-938. Peripheral vascular disease with bilateral lower extremity outflow disease, incompletely assessed on this examination. Moderate prostatic enlargement. Stable circumferential bladder wall thickening likely reflecting changes of chronic bladder outlet obstruction. No superimposed bladder distension. Aortic Atherosclerosis (ICD10-I70.0) and Emphysema (ICD10-J43.9). Aortic aneurysm NOS (ICD10-I71.9). Electronically Signed   By: Fidela Salisbury M.D.   On: 12/05/2021 22:41   DG Chest Port 1 View  Result Date: 12/05/2021 CLINICAL DATA:  Altered mental status and weakness. EXAM: PORTABLE CHEST 1 VIEW COMPARISON:  November 21, 2021 FINDINGS: The lungs are hyperinflated. Mild atelectasis is seen along the lateral aspect of the mid right lung. There is no evidence of acute infiltrate, pleural effusion or pneumothorax. The heart size and mediastinal contours are within normal limits. The visualized skeletal structures are unremarkable. IMPRESSION: No active disease. Electronically Signed   By: Virgina Norfolk M.D.   On: 12/05/2021 21:10    EKG: I independently viewed the EKG done and my findings are as followed: Sinus tachycardia at a rate  of 121 bpm  Assessment/Plan Present on Admission:  Sepsis secondary to UTI ([place])  COPD (chronic obstructive pulmonary disease) (HCC)  PAD (peripheral artery disease) (HCC)  Active Problems:   COPD (chronic obstructive pulmonary disease) (HCC)   PAD (peripheral artery disease) (HCC)   Sepsis secondary to UTI (HCC)   Leukocytosis   Hypoalbuminemia due to protein-calorie malnutrition (HCC)   Lactic acidosis   AKI (acute kidney injury) ([place])   Essential hypertension   Mixed hyperlipidemia   GERD (gastroesophageal reflux disease)  Sepsis secondary to UTI Patient met sepsis criteria on admission with being febrile, tachypneic and leukocytosis with source of infection pain UTI.  Lactic acid was elevated on admission. Patient  was started on IV ceftriaxone, we shall continue same at this time with plan to de-escalate/discontinue based on urine culture and blood culture Continue Tylenol as needed for fever  Leukocytosis secondary to above Continue treatment as described above  Lactic acidosis- resolved Lactic acid 2.4 > 1.4  Acute kidney injury BUN/creatinine 24/1.31 (baseline creatinine of 0.8-0.9). Continue gentle hydration Renally adjust medications, avoid nephrotoxic agents/dehydration/hypotension  Hypoalbuminemia secondary to moderate protein calorie malnutrition Albumin 2.8, protein supplement to be provided  PAD Continue Plavix and Crestor  COPD Continue albuterol and Dulera  Essential hypertension BP med will be held at this time due to soft BP  Mixed hyperlipidemia Continue Crestor   DVT prophylaxis: Lovenox  Code Status: DNR   Family Communication: None at bedside  Disposition Plan:  Patient is from:                        home Anticipated DC to:                   SNF or family members home Anticipated DC date:               2-3 days Anticipated DC barriers:         Patient requires inpatient management due to sepsis secondary to UTI  Consults called: None  Admission status: Inpatient    Bernadette Hoit MD [facility]  12/06/2021, 2:25 AM

## 2021-12-05 NOTE — ED Triage Notes (Signed)
Pt BIB EMS due to being altered. Friend found pt and he had been incontinent. Pt usually can perform ADLs independently. Pt unable to ambulate. Pt a&ox1. Pt currently being treated for kidney stone.

## 2021-12-06 ENCOUNTER — Encounter (HOSPITAL_COMMUNITY): Payer: Self-pay | Admitting: Internal Medicine

## 2021-12-06 DIAGNOSIS — N390 Urinary tract infection, site not specified: Secondary | ICD-10-CM | POA: Diagnosis not present

## 2021-12-06 DIAGNOSIS — I739 Peripheral vascular disease, unspecified: Secondary | ICD-10-CM | POA: Diagnosis not present

## 2021-12-06 DIAGNOSIS — E782 Mixed hyperlipidemia: Secondary | ICD-10-CM

## 2021-12-06 DIAGNOSIS — R4182 Altered mental status, unspecified: Secondary | ICD-10-CM

## 2021-12-06 DIAGNOSIS — D72829 Elevated white blood cell count, unspecified: Secondary | ICD-10-CM

## 2021-12-06 DIAGNOSIS — J449 Chronic obstructive pulmonary disease, unspecified: Secondary | ICD-10-CM | POA: Diagnosis not present

## 2021-12-06 DIAGNOSIS — I10 Essential (primary) hypertension: Secondary | ICD-10-CM

## 2021-12-06 DIAGNOSIS — K219 Gastro-esophageal reflux disease without esophagitis: Secondary | ICD-10-CM

## 2021-12-06 DIAGNOSIS — A419 Sepsis, unspecified organism: Secondary | ICD-10-CM | POA: Diagnosis present

## 2021-12-06 DIAGNOSIS — E872 Acidosis, unspecified: Secondary | ICD-10-CM

## 2021-12-06 DIAGNOSIS — N179 Acute kidney failure, unspecified: Secondary | ICD-10-CM

## 2021-12-06 DIAGNOSIS — E8809 Other disorders of plasma-protein metabolism, not elsewhere classified: Secondary | ICD-10-CM

## 2021-12-06 LAB — CBC
HCT: 36.6 % — ABNORMAL LOW (ref 39.0–52.0)
Hemoglobin: 12.1 g/dL — ABNORMAL LOW (ref 13.0–17.0)
MCH: 28.7 pg (ref 26.0–34.0)
MCHC: 33.1 g/dL (ref 30.0–36.0)
MCV: 86.7 fL (ref 80.0–100.0)
Platelets: 200 10*3/uL (ref 150–400)
RBC: 4.22 MIL/uL (ref 4.22–5.81)
RDW: 14.3 % (ref 11.5–15.5)
WBC: 9.6 10*3/uL (ref 4.0–10.5)
nRBC: 0 % (ref 0.0–0.2)

## 2021-12-06 LAB — COMPREHENSIVE METABOLIC PANEL
ALT: 32 U/L (ref 0–44)
AST: 17 U/L (ref 15–41)
Albumin: 2.2 g/dL — ABNORMAL LOW (ref 3.5–5.0)
Alkaline Phosphatase: 59 U/L (ref 38–126)
Anion gap: 9 (ref 5–15)
BUN: 21 mg/dL (ref 8–23)
CO2: 23 mmol/L (ref 22–32)
Calcium: 7.9 mg/dL — ABNORMAL LOW (ref 8.9–10.3)
Chloride: 102 mmol/L (ref 98–111)
Creatinine, Ser: 1 mg/dL (ref 0.61–1.24)
GFR, Estimated: >60 mL/min (ref >60)
Glucose, Bld: 130 mg/dL — ABNORMAL HIGH (ref 70–99)
Potassium: 3.8 mmol/L (ref 3.5–5.1)
Sodium: 134 mmol/L — ABNORMAL LOW (ref 135–145)
Total Bilirubin: 0.6 mg/dL (ref 0.3–1.2)
Total Protein: 6 g/dL — ABNORMAL LOW (ref 6.5–8.1)

## 2021-12-06 LAB — BLOOD GAS, ARTERIAL
Acid-Base Excess: 1 mmol/L (ref 0.0–2.0)
Bicarbonate: 25.5 mmol/L (ref 20.0–28.0)
Drawn by: 41977
FIO2: 21
O2 Saturation: 96.3 %
Patient temperature: 38
pCO2 arterial: 37.9 mmHg (ref 32.0–48.0)
pH, Arterial: 7.433 (ref 7.350–7.450)
pO2, Arterial: 95.4 mmHg (ref 83.0–108.0)

## 2021-12-06 LAB — MAGNESIUM: Magnesium: 1.9 mg/dL (ref 1.7–2.4)

## 2021-12-06 LAB — APTT: aPTT: 33 seconds (ref 24–36)

## 2021-12-06 LAB — PHOSPHORUS: Phosphorus: 3.7 mg/dL (ref 2.5–4.6)

## 2021-12-06 MED ORDER — ENOXAPARIN SODIUM 40 MG/0.4ML IJ SOSY
40.0000 mg | PREFILLED_SYRINGE | INTRAMUSCULAR | Status: DC
  Administered 2021-12-09: 09:00:00 40 mg via SUBCUTANEOUS
  Filled 2021-12-06 (×6): qty 0.4

## 2021-12-06 MED ORDER — SODIUM CHLORIDE 0.9 % IV SOLN: INTRAVENOUS | Status: AC

## 2021-12-06 MED ORDER — CLOPIDOGREL BISULFATE 75 MG PO TABS
75.0000 mg | ORAL_TABLET | Freq: Every day | ORAL | Status: DC
  Administered 2021-12-06 – 2021-12-12 (×7): 75 mg via ORAL
  Filled 2021-12-06 (×7): qty 1

## 2021-12-06 MED ORDER — IPRATROPIUM-ALBUTEROL 0.5-2.5 (3) MG/3ML IN SOLN
3.0000 mL | Freq: Four times a day (QID) | RESPIRATORY_TRACT | Status: DC
  Administered 2021-12-07 – 2021-12-09 (×4): 3 mL via RESPIRATORY_TRACT
  Filled 2021-12-06 (×6): qty 3

## 2021-12-06 MED ORDER — IPRATROPIUM-ALBUTEROL 0.5-2.5 (3) MG/3ML IN SOLN
3.0000 mL | Freq: Four times a day (QID) | RESPIRATORY_TRACT | Status: DC
  Administered 2021-12-06: 20:00:00 3 mL via RESPIRATORY_TRACT
  Filled 2021-12-06: qty 3

## 2021-12-06 MED ORDER — KETOROLAC TROMETHAMINE 15 MG/ML IJ SOLN
15.0000 mg | Freq: Once | INTRAMUSCULAR | Status: AC
  Administered 2021-12-06: 14:00:00 15 mg via INTRAVENOUS
  Filled 2021-12-06: qty 1

## 2021-12-06 MED ORDER — SODIUM CHLORIDE 0.9 % IV SOLN: Freq: Once | INTRAVENOUS | Status: AC

## 2021-12-06 MED ORDER — BUDESONIDE 0.25 MG/2ML IN SUSP
0.2500 mg | Freq: Two times a day (BID) | RESPIRATORY_TRACT | Status: DC
  Administered 2021-12-06 – 2021-12-09 (×5): 0.25 mg via RESPIRATORY_TRACT
  Filled 2021-12-06 (×13): qty 2

## 2021-12-06 MED ORDER — IPRATROPIUM-ALBUTEROL 0.5-2.5 (3) MG/3ML IN SOLN
3.0000 mL | Freq: Four times a day (QID) | RESPIRATORY_TRACT | Status: DC | PRN
  Administered 2021-12-06: 15:00:00 3 mL via RESPIRATORY_TRACT
  Filled 2021-12-06: qty 3

## 2021-12-06 MED ORDER — ROSUVASTATIN CALCIUM 20 MG PO TABS
20.0000 mg | ORAL_TABLET | Freq: Every day | ORAL | Status: DC
  Administered 2021-12-06 – 2021-12-12 (×7): 20 mg via ORAL
  Filled 2021-12-06 (×7): qty 1

## 2021-12-06 MED ORDER — CEFTRIAXONE SODIUM 2 G IJ SOLR
2.0000 g | INTRAMUSCULAR | Status: DC
Start: 2021-12-06 — End: 2021-12-07
  Administered 2021-12-06: 23:00:00 2 g via INTRAVENOUS
  Filled 2021-12-06: qty 20

## 2021-12-06 MED ORDER — ALBUTEROL SULFATE HFA 108 (90 BASE) MCG/ACT IN AERS: 2.0000 | INHALATION_SPRAY | RESPIRATORY_TRACT | Status: DC | PRN

## 2021-12-06 MED ORDER — SODIUM CHLORIDE 0.9 % IV BOLUS
500.0000 mL | Freq: Once | INTRAVENOUS | Status: AC
  Administered 2021-12-06: 02:00:00 500 mL via INTRAVENOUS

## 2021-12-06 MED ORDER — ENSURE ENLIVE PO LIQD
237.0000 mL | Freq: Three times a day (TID) | ORAL | Status: DC
Start: 2021-12-06 — End: 2021-12-13
  Administered 2021-12-06 – 2021-12-12 (×14): 237 mL via ORAL

## 2021-12-06 MED ORDER — SODIUM CHLORIDE 0.9 % IV SOLN: 1.0000 g | INTRAVENOUS | Status: DC

## 2021-12-06 MED ORDER — ACETAMINOPHEN 325 MG PO TABS
650.0000 mg | ORAL_TABLET | Freq: Four times a day (QID) | ORAL | Status: DC | PRN
  Administered 2021-12-06 – 2021-12-12 (×13): 650 mg via ORAL
  Filled 2021-12-06 (×14): qty 2

## 2021-12-06 MED ORDER — PANTOPRAZOLE SODIUM 40 MG PO TBEC
40.0000 mg | DELAYED_RELEASE_TABLET | Freq: Every day | ORAL | Status: DC
  Administered 2021-12-06 – 2021-12-12 (×7): 40 mg via ORAL
  Filled 2021-12-06 (×7): qty 1

## 2021-12-06 MED ORDER — ENSURE ENLIVE PO LIQD: 237.0000 mL | Freq: Two times a day (BID) | ORAL | Status: DC

## 2021-12-06 MED ORDER — ALBUTEROL SULFATE (2.5 MG/3ML) 0.083% IN NEBU: 2.5000 mg | INHALATION_SOLUTION | RESPIRATORY_TRACT | Status: DC | PRN

## 2021-12-06 MED ORDER — MOMETASONE FURO-FORMOTEROL FUM 200-5 MCG/ACT IN AERO: 2.0000 | INHALATION_SPRAY | Freq: Two times a day (BID) | RESPIRATORY_TRACT | Status: DC

## 2021-12-06 MED ORDER — MELATONIN 3 MG PO TABS
6.0000 mg | ORAL_TABLET | Freq: Every day | ORAL | Status: DC
  Administered 2021-12-06 – 2021-12-11 (×6): 6 mg via ORAL
  Filled 2021-12-06 (×6): qty 2

## 2021-12-06 NOTE — Progress Notes (Signed)
PROGRESS NOTE    Raymond Walker.  TLX:[id number] DOB: November 17, 1951 DOA: 12/05/2021 PCP: [name], MD    Brief Narrative:  70 year old male with a history of COPD, PAD, prior history of kidney stones, who was recently in the hospital from 12 6-12 8 when he was noted to have obstructing right-sided renal stones and UTI.  He was seen by urology and underwent right ureter stent placement.  He was discharged with Keflex.  Unclear if he completed this antibiotic.  He returns to the hospital with altered mental status and fevers.  Noted to have recurrent infection and CT indicating right-sided pyelonephritis, but no evidence of obstructing stone/hydronephrosis.  Discussed with urology who recommended continued antibiotic therapy.  Follow-up cultures.   Assessment & Plan:   Active Problems:   COPD (chronic obstructive pulmonary disease) (HCC)   PAD (peripheral artery disease) (HCC)   Sepsis secondary to UTI (HCC)   Leukocytosis   Hypoalbuminemia due to protein-calorie malnutrition (HCC)   Lactic acidosis   AKI (acute kidney injury) ([place])   Essential hypertension   Mixed hyperlipidemia   GERD (gastroesophageal reflux disease)   Sepsis secondary to pyelonephritis -Admitted with fever, tachypnea, leukocytosis -Lactic acid elevated on admission -Blood cultures and urine culture have been sent -Continues to have fevers -Would continue on Rocephin for now -Follow-up cultures  Acute metabolic encephalopathy -Suspect is related to underlying sepsis -Continue to monitor -ABG shows normal PCO2  COPD -Noted to have diminished breath sounds bilaterally -Continue on bronchodilators -Continue inhaled steroids -Chest x-ray from admission does not show any pneumonia  Hypertension -Antihypertensives held on admission due to soft blood pressures -Continue to monitor  PAD -Continue on statin and Plavix  Hyperlipidemia -Continue statin  Generalized weakness -We will need physical  therapy evaluation   DVT prophylaxis: enoxaparin (LOVENOX) injection 40 mg Start: 12/06/21 1000 SCDs Start: 12/06/21 0128  Code Status: DNR Family Communication: Discussed with patient Disposition Plan: Status is: Inpatient  Remains inpatient appropriate because: Continued fevers and need for IV antibiotic therapy.         Consultants:    Procedures:    Antimicrobials:  Ceftriaxone 12/20 >   Subjective: Continues to be somnolent at times, but overall mental status appears to be improving.  Denies any flank pain at this time.  Objective: Vitals:   12/06/21 1300 12/06/21 1400 12/06/21 1513 12/06/21 1919  BP: (!) 146/56     Pulse:      Resp:      Temp: (!) 102.3 F (39.1 C) 100.3 F (37.9 C)  (!) 97.4 F (36.3 C)  TempSrc: Oral Oral  Oral  SpO2:   100%   Weight:      Height:        Intake/Output Summary (Last 24 hours) at 12/06/2021 1945 Last data filed at 12/06/2021 1500 Gross per 24 hour  Intake 2290 ml  Output 900 ml  Net 1390 ml   Filed Weights   12/06/21 0139  Weight: 64.8 kg    Examination:  General exam: Appears calm and comfortable  Respiratory system: Diminished breath sounds bilaterally Cardiovascular system: S1 & S2 heard, RRR. No JVD, murmurs, rubs, gallops or clicks. No pedal edema. Gastrointestinal system: Abdomen is nondistended, soft and nontender. No organomegaly or masses felt. Normal bowel sounds heard. Central nervous system: Alert and oriented. No focal neurological deficits. Extremities: Symmetric 5 x 5 power. Skin: No rashes, lesions or ulcers Psychiatry: Judgement and insight appear normal. Mood & affect appropriate.  Data Reviewed: I have personally reviewed following labs and imaging studies  CBC: Recent Labs  Lab 12/05/21 2036 12/06/21 0453  WBC 17.2* 9.6  HGB 14.5 12.1*  HCT 43.9 36.6*  MCV 85.9 86.7  PLT 275 833   Basic Metabolic Panel: Recent Labs  Lab 12/05/21 2036 12/06/21 0453  NA 132* 134*  K  4.0 3.8  CL 96* 102  CO2 26 23  GLUCOSE 194* 130*  BUN 24* 21  CREATININE 1.31* 1.00  CALCIUM 8.6* 7.9*  MG  --  1.9  PHOS  --  3.7   GFR: Estimated Creatinine Clearance: 62.1 mL/min (by C-G formula based on SCr of 1 mg/dL). Liver Function Tests: Recent Labs  Lab 12/05/21 2036 12/06/21 0453  AST 27 17  ALT 44 32  ALKPHOS 80 59  BILITOT 0.8 0.6  PROT 7.8 6.0*  ALBUMIN 2.8* 2.2*   No results for input(s): LIPASE, AMYLASE in the last 168 hours. No results for input(s): AMMONIA in the last 168 hours. Coagulation Profile: No results for input(s): INR, PROTIME in the last 168 hours. Cardiac Enzymes: No results for input(s): CKTOTAL, CKMB, CKMBINDEX, TROPONINI in the last 168 hours. BNP (last 3 results) No results for input(s): PROBNP in the last 8760 hours. HbA1C: No results for input(s): HGBA1C in the last 72 hours. CBG: No results for input(s): GLUCAP in the last 168 hours. Lipid Profile: No results for input(s): CHOL, HDL, LDLCALC, TRIG, CHOLHDL, LDLDIRECT in the last 72 hours. Thyroid Function Tests: No results for input(s): TSH, T4TOTAL, FREET4, T3FREE, THYROIDAB in the last 72 hours. Anemia Panel: No results for input(s): VITAMINB12, FOLATE, FERRITIN, TIBC, IRON, RETICCTPCT in the last 72 hours. Sepsis Labs: Recent Labs  Lab 12/05/21 2036 12/05/21 2256  LATICACIDVEN 2.4* 1.4    Recent Results (from the past 240 hour(s))  Blood culture (routine x 2)     Status: None (Preliminary result)   Collection Time: 12/05/21  8:36 PM   Specimen: BLOOD LEFT FOREARM  Result Value Ref Range Status   Specimen Description BLOOD LEFT FOREARM  Final   Special Requests   Final    BOTTLES DRAWN AEROBIC AND ANAEROBIC Blood Culture adequate volume   Culture   Final    NO GROWTH < 24 HOURS Performed at [facility], [address] [place], [place] [address]    Report Status PENDING  Incomplete  Blood culture (routine x 2)     Status: None (Preliminary result)   Collection  Time: 12/05/21  8:36 PM   Specimen: Right Antecubital; Blood  Result Value Ref Range Status   Specimen Description RIGHT ANTECUBITAL  Final   Special Requests   Final    BOTTLES DRAWN AEROBIC AND ANAEROBIC Blood Culture results may not be optimal due to an inadequate volume of blood received in culture bottles   Culture  Setup Time NO ORGANISMS SEEN  Final   Culture   Final    NO GROWTH < 24 HOURS Performed at [facility], [address] [place], [place] [address]    Report Status PENDING  Incomplete  Resp Panel by RT-PCR (Flu A&B, Covid) Nasopharyngeal Swab     Status: None   Collection Time: 12/05/21  8:36 PM   Specimen: Nasopharyngeal Swab; Nasopharyngeal(NP) swabs in vial transport medium  Result Value Ref Range Status   SARS Coronavirus 2 by RT PCR NEGATIVE NEGATIVE Final    Comment: (NOTE) SARS-CoV-2 target nucleic acids are NOT DETECTED.  The SARS-CoV-2 RNA is generally detectable in  upper respiratory specimens during the acute phase of infection. The lowest concentration of SARS-CoV-2 viral copies this assay can detect is 138 copies/mL. A negative result does not preclude SARS-Cov-2 infection and should not be used as the sole basis for treatment or other patient management decisions. A negative result may occur with  improper specimen collection/handling, submission of specimen other than nasopharyngeal swab, presence of viral mutation(s) within the areas targeted by this assay, and inadequate number of viral copies(<138 copies/mL). A negative result must be combined with clinical observations, patient history, and epidemiological information. The expected result is Negative.  Fact Sheet for Patients:  EntrepreneurPulse.com.au  Fact Sheet for Healthcare Providers:  IncredibleEmployment.be  This test is no t yet approved or cleared by the [place] FDA and  has been authorized for detection and/or diagnosis of SARS-CoV-2 by FDA  under an Emergency Use Authorization (EUA). This EUA will remain  in effect (meaning this test can be used) for the duration of the COVID-19 declaration under Section 564(b)(1) of the Act, 21 U.S.C.section 360bbb-3(b)(1), unless the authorization is terminated  or revoked sooner.       Influenza A by PCR NEGATIVE NEGATIVE Final   Influenza B by PCR NEGATIVE NEGATIVE Final    Comment: (NOTE) The Xpert Xpress SARS-CoV-2/FLU/RSV plus assay is intended as an aid in the diagnosis of influenza from Nasopharyngeal swab specimens and should not be used as a sole basis for treatment. Nasal washings and aspirates are unacceptable for Xpert Xpress SARS-CoV-2/FLU/RSV testing.  Fact Sheet for Patients: EntrepreneurPulse.com.au  Fact Sheet for Healthcare Providers: IncredibleEmployment.be  This test is not yet approved or cleared by the [place] FDA and has been authorized for detection and/or diagnosis of SARS-CoV-2 by FDA under an Emergency Use Authorization (EUA). This EUA will remain in effect (meaning this test can be used) for the duration of the COVID-19 declaration under Section 564(b)(1) of the Act, 21 U.S.C. section 360bbb-3(b)(1), unless the authorization is terminated or revoked.  Performed at [facility], [address], [place], [place] [address]          Radiology Studies: CT Abdomen Pelvis W Contrast  Result Date: 12/05/2021 CLINICAL DATA:  Right lower quadrant abdominal pain EXAM: CT ABDOMEN AND PELVIS WITH CONTRAST TECHNIQUE: Multidetector CT imaging of the abdomen and pelvis was performed using the standard protocol following bolus administration of intravenous contrast. CONTRAST:  138mL OMNIPAQUE IOHEXOL 300 MG/ML  SOLN COMPARISON:  11/21/2021 FINDINGS: Lower chest: Emphysematous changes are noted within the lung bases. Cardiac size within normal limits. Hepatobiliary: Focal fatty sparing within the gallbladder fossa. No  enhancing intrahepatic mass identified. Status post cholecystectomy. No biliary dilatation. Pancreas: Unremarkable Spleen: Unremarkable Adrenals/Urinary Tract: The adrenal glands are unremarkable. The kidneys are normal in position. There is asymmetric cortical atrophy involving the upper pole the left kidney. There is an associated irregular, thick-walled cystic lesion within the upper pole demonstrating lateral mural calcification which appears unchanged from remote prior examination of 06/23/2017 is likely benign given its stability over time. Simple cortical cyst noted within the lower pole of the left kidney and upper pole the right kidney. Multiple nonobstructing renal calculi are noted within the kidneys bilaterally, stable since prior examination. Right double-J ureteral stent has been placed in the interval since prior examination extending from the right renal pelvis to the bladder. Previously noted trace hydronephrosis has resolved. There has developed mild right perinephric stranding which tracks into the right posterior pararenal space which may be postprocedural in nature or relate  to superimposed inflammation as can be seen with pyelonephritis. Stable 5 and 14 mm calculi within the distal right ureter. No ureteral calculi on the left. No hydronephrosis on the left. The bladder is mildly thick walled suggesting changes of chronic bladder outlet obstruction, unchanged from multiple prior examinations. The bladder is not distended Stomach/Bowel: Stomach is within normal limits. Appendix appears normal. No evidence of bowel wall thickening, distention, or inflammatory changes. Vascular/Lymphatic: 3.0 cm infrarenal fusiform abdominal aortic aneurysm demonstrating moderate mural thrombus is again identified, stable since immediate prior examination and increased since remote prior examination of 06/23/2017 where this measured 2.6 cm. Right common iliac artery stent grafting has been performed with  thrombosis of the excluded right common iliac artery aneurysm sac. Right external iliac artery has been stented. Thrombosis of a right common femoral-distal bypass graft is partially visualized. Thrombosis of right superficial femoral arteries partially visualized. Ectasia of left common iliac artery is unchanged. 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 No pathologic adenopathy within the abdomen and pelvis. Reproductive: Moderate prostatic enlargement with indentation of the bladder base is again noted. Other: No abdominal wall hernia.  Rectum unremarkable. Musculoskeletal: No acute bone abnormality. No lytic or blastic bone lesion. IMPRESSION: Interval right double-J ureteral stent placement with resolution of previously noted right hydronephrosis. Right ureteral stones are unchanged in position, however, measuring up to 14 mm within the distal right ureter. There has developed mild right perinephric inflammatory stranding which may be postprocedural in nature or relate to a superimposed inflammatory process such as pyelonephritis. Correlation with urinalysis may be helpful. Stable bilateral nonobstructing nephrolithiasis. Unchanged complex cystic lesion within the upper pole the left kidney, stable since remote prior examination of 06/23/2017. Emphysema. 3 cm infrarenal abdominal aortic aneurysm. Recommend follow-up every 3 years. Reference: J Am Coll Radiol 0865;78:469-629. Peripheral vascular disease with bilateral lower extremity outflow disease, incompletely assessed on this examination. Moderate prostatic enlargement. Stable circumferential bladder wall thickening likely reflecting changes of chronic bladder outlet obstruction. No superimposed bladder distension. Aortic Atherosclerosis (ICD10-I70.0) and Emphysema (ICD10-J43.9). Aortic aneurysm NOS (ICD10-I71.9). Electronically Signed   By: Fidela Salisbury M.D.   On: 12/05/2021 22:41   DG Chest Port 1  View  Result Date: 12/05/2021 CLINICAL DATA:  Altered mental status and weakness. EXAM: PORTABLE CHEST 1 VIEW COMPARISON:  November 21, 2021 FINDINGS: The lungs are hyperinflated. Mild atelectasis is seen along the lateral aspect of the mid right lung. There is no evidence of acute infiltrate, pleural effusion or pneumothorax. The heart size and mediastinal contours are within normal limits. The visualized skeletal structures are unremarkable. IMPRESSION: No active disease. Electronically Signed   By: Virgina Norfolk M.D.   On: 12/05/2021 21:10        Scheduled Meds:  budesonide (PULMICORT) nebulizer solution  0.25 mg Nebulization BID   clopidogrel  75 mg Oral Daily   enoxaparin (LOVENOX) injection  40 mg Subcutaneous Q24H   feeding supplement  237 mL Oral TID BM   ipratropium-albuterol  3 mL Nebulization Q6H   pantoprazole  40 mg Oral Daily   rosuvastatin  20 mg Oral Daily   Continuous Infusions:  cefTRIAXone (ROCEPHIN)  IV       LOS: 1 day    Time spent: 35 minutes    Kathie Dike, MD [facility]   If 7PM-7AM, please contact night-coverage [email or web address]  12/06/2021, 7:45 PM

## 2021-12-06 NOTE — Progress Notes (Signed)
°   12/06/21 1303  Assess: if the MEWS score is Yellow or Red  Were vital signs taken at a resting state? Yes  Focused Assessment No change from prior assessment  Early Detection of Sepsis Score *See Row Information* Low  MEWS guidelines implemented *See Row Information* Yes  Treat  MEWS Interventions Escalated (See documentation below)  Pain Score 7  Take Vital Signs  Increase Vital Sign Frequency  Yellow: Q 2hr X 2 then Q 4hr X 2, if remains yellow, continue Q 4hrs  Escalate  MEWS: Escalate Yellow: discuss with charge nurse/RN and consider discussing with provider and RRT  Notify: Charge Nurse/RN  Name of Charge Nurse/RN Notified M Sopheap Basic  Date Charge Nurse/RN Notified 12/06/21  Time Charge Nurse/RN Notified 1300  Notify: Provider  Provider Name/Title Dr. Roderic Palau  Date Provider Notified 12/06/21  Time Provider Notified 1300  Notification Type Page  Notification Reason Other (Comment)  Provider response See new orders  Date of Provider Response 12/06/21  Time of Provider Response 1300

## 2021-12-06 NOTE — Progress Notes (Signed)
°  Transition of Care [facility]) Screening Note   Patient Details  Name: Zymarion Favorite. Date of Birth: 05/25/1951   Transition of Care [facility]) CM/SW Contact:    Ihor Gully, LCSW Phone Number: 12/06/2021, 12:22 PM    Transition of Care [facility]) has reviewed patient and no TOC needs have been identified at this time. We will continue to monitor patient advancement through interdisciplinary progression rounds. If new patient transition needs arise, please place a TOC consult.

## 2021-12-06 NOTE — Progress Notes (Signed)
Initial Nutrition Assessment  DOCUMENTATION CODES:   Severe malnutrition in context of acute illness/injury  INTERVENTION:  Ensure Enlive po BID, prefers chocolate flavor  Recommend consider assist with tray set-up and feeding as needed to maximize oral intake  Multivitamin daily  NUTRITION DIAGNOSIS:   Severe Malnutrition related to acute illness (sepsis/ UTI. Obstructing kidney stones per CT) as evidenced by percent weight loss, moderate fat depletion, moderate muscle depletion and meal intake 25-50%.   GOAL:  Patient will meet greater than or equal to 90% of their needs  MONITOR:  PO intake, Supplement acceptance, Labs, Skin, Weight trends  REASON FOR ASSESSMENT:   Malnutrition Screening Tool    ASSESSMENT: Patient is a 70 yo male from home with hx of recurrent pancreatitis, COPD, HTN. He presents with altered mental status, sepsis secondary to UTI.  CT findings significant for numerous obstructing kidney stones per MD.   Patient breakfast tray still in room 25% consumed. His lunch tray is here and untouched. RD offered pt an Ensure he prefers chocolate. Provided for him and he consumed 50% while RD in room. Encouraged pt eat meals as well. He has dentures with him. Question if he may need some feeding assistance.   Acute weight loss 5.5% < 30 days. Likely related to acute illness.   Medications: Antibiotic and Lovenox.  Labs: BMP Latest Ref Rng & Units 12/06/2021 12/05/2021 11/23/2021  Glucose 70 - 99 mg/dL 130(H) 194(H) 162(H)  BUN 8 - 23 mg/dL 21 24(H) 24(H)  Creatinine 0.61 - 1.24 mg/dL 1.00 1.31(H) 0.81  BUN/Creat Ratio 10 - 24 - - -  Sodium 135 - 145 mmol/L 134(L) 132(L) 137  Potassium 3.5 - 5.1 mmol/L 3.8 4.0 3.6  Chloride 98 - 111 mmol/L 102 96(L) 104  CO2 22 - 32 mmol/L 23 26 27   Calcium 8.9 - 10.3 mg/dL 7.9(L) 8.6(L) 8.4(L)      NUTRITION - FOCUSED PHYSICAL EXAM:  Flowsheet Row Most Recent Value  Orbital Region Moderate depletion  Upper Arm Region  Moderate depletion  Thoracic and Lumbar Region Mild depletion  Buccal Region Mild depletion  Temple Region Moderate depletion  Clavicle Bone Region Moderate depletion  Clavicle and Acromion Bone Region Moderate depletion  Scapular Bone Region Mild depletion  Patellar Region Severe depletion  Anterior Thigh Region Severe depletion  Posterior Calf Region Moderate depletion  Edema (RD Assessment) None  Hair Reviewed  Eyes Reviewed  Mouth Reviewed  Skin Reviewed  [dry, flaky]  Nails Reviewed       Diet Order:   Diet Order             Diet Heart Room service appropriate? Yes; Fluid consistency: Thin  Diet effective now                   EDUCATION NEEDS:  Education needs have been addressed  Skin:  Skin Assessment: Reviewed RN Assessment  Last BM:  12/19  Height:   Ht Readings from Last 1 Encounters:  12/06/21 6' (1.829 m)    Weight:   Wt Readings from Last 1 Encounters:  12/06/21 64.8 kg    Ideal Body Weight:   81 kg  BMI:  Body mass index is 19.38 kg/m.  Estimated Nutritional Needs:   Kcal:  2130-8657  Protein:  90-100 gr  Fluid:  2 liters daily  Colman Cater MS,RD,CSG,LDN Contact: Shea Evans

## 2021-12-07 ENCOUNTER — Encounter: Payer: Self-pay | Admitting: Urology

## 2021-12-07 ENCOUNTER — Inpatient Hospital Stay (HOSPITAL_COMMUNITY): Payer: Medicare Other

## 2021-12-07 ENCOUNTER — Ambulatory Visit: Payer: Medicare Other | Admitting: Urology

## 2021-12-07 DIAGNOSIS — I739 Peripheral vascular disease, unspecified: Secondary | ICD-10-CM | POA: Diagnosis not present

## 2021-12-07 DIAGNOSIS — R4182 Altered mental status, unspecified: Secondary | ICD-10-CM | POA: Diagnosis not present

## 2021-12-07 DIAGNOSIS — J449 Chronic obstructive pulmonary disease, unspecified: Secondary | ICD-10-CM | POA: Diagnosis not present

## 2021-12-07 DIAGNOSIS — N390 Urinary tract infection, site not specified: Secondary | ICD-10-CM | POA: Diagnosis not present

## 2021-12-07 LAB — COMPREHENSIVE METABOLIC PANEL
ALT: 27 U/L (ref 0–44)
AST: 20 U/L (ref 15–41)
Albumin: 2 g/dL — ABNORMAL LOW (ref 3.5–5.0)
Alkaline Phosphatase: 53 U/L (ref 38–126)
Anion gap: 8 (ref 5–15)
BUN: 27 mg/dL — ABNORMAL HIGH (ref 8–23)
CO2: 23 mmol/L (ref 22–32)
Calcium: 8 mg/dL — ABNORMAL LOW (ref 8.9–10.3)
Chloride: 101 mmol/L (ref 98–111)
Creatinine, Ser: 1.08 mg/dL (ref 0.61–1.24)
GFR, Estimated: >60 mL/min (ref >60)
Glucose, Bld: 199 mg/dL — ABNORMAL HIGH (ref 70–99)
Potassium: 3.5 mmol/L (ref 3.5–5.1)
Sodium: 132 mmol/L — ABNORMAL LOW (ref 135–145)
Total Bilirubin: 0.4 mg/dL (ref 0.3–1.2)
Total Protein: 5.7 g/dL — ABNORMAL LOW (ref 6.5–8.1)

## 2021-12-07 LAB — BLOOD CULTURE ID PANEL (REFLEXED) - BCID2

## 2021-12-07 LAB — CBC
HCT: 33.7 % — ABNORMAL LOW (ref 39.0–52.0)
Hemoglobin: 10.9 g/dL — ABNORMAL LOW (ref 13.0–17.0)
MCH: 27.8 pg (ref 26.0–34.0)
MCHC: 32.3 g/dL (ref 30.0–36.0)
MCV: 86 fL (ref 80.0–100.0)
Platelets: 194 10*3/uL (ref 150–400)
RBC: 3.92 MIL/uL — ABNORMAL LOW (ref 4.22–5.81)
RDW: 14.2 % (ref 11.5–15.5)
WBC: 8.1 10*3/uL (ref 4.0–10.5)
nRBC: 0 % (ref 0.0–0.2)

## 2021-12-07 MED ORDER — SODIUM CHLORIDE 0.9 % IV SOLN
1.0000 g | Freq: Three times a day (TID) | INTRAVENOUS | Status: DC
  Administered 2021-12-07 – 2021-12-10 (×9): 1 g via INTRAVENOUS
  Filled 2021-12-07 (×9): qty 1

## 2021-12-07 MED ORDER — KETOROLAC TROMETHAMINE 30 MG/ML IJ SOLN
30.0000 mg | Freq: Once | INTRAMUSCULAR | Status: AC
  Administered 2021-12-07: 01:00:00 30 mg via INTRAVENOUS
  Filled 2021-12-07: qty 1

## 2021-12-07 NOTE — Progress Notes (Signed)
PROGRESS NOTE    Raymond Walker.  VWP:[id number] DOB: 07-21-1951 DOA: 12/05/2021 PCP: [name], MD    Brief Narrative:  70 year old male with a history of COPD, PAD, prior history of kidney stones, who was recently in the hospital from 12 6-12 8 when he was noted to have obstructing right-sided renal stones and UTI.  He was seen by urology and underwent right ureter stent placement.  He was discharged with Keflex.  Unclear if he completed this antibiotic.  He returns to the hospital with altered mental status and fevers.  Noted to have recurrent infection and CT indicating right-sided pyelonephritis, but no evidence of obstructing stone/hydronephrosis.  Discussed with urology who recommended continued antibiotic therapy.  Follow-up cultures.   Assessment & Plan:   Active Problems:   COPD (chronic obstructive pulmonary disease) (HCC)   PAD (peripheral artery disease) (HCC)   Sepsis secondary to UTI (HCC)   Leukocytosis   Hypoalbuminemia due to protein-calorie malnutrition (HCC)   Lactic acidosis   AKI (acute kidney injury) ([place])   Essential hypertension   Mixed hyperlipidemia   GERD (gastroesophageal reflux disease)   Sepsis secondary to pyelonephritis -Admitted with fever, tachypnea, leukocytosis -Lactic acid elevated on admission -Urine culture appears to be positive for Pseudomonas -Continues to have fevers -Changing ceftriaxone to meropenem -He was noted to have 1 out of 2 positive blood cultures for staph species.  Suspect this may be a contaminant -Since he had continued fevers today, blood cultures were repeated today.  Acute metabolic encephalopathy -Suspect is related to underlying sepsis -Continue to monitor -ABG shows normal PCO2  COPD -Noted to have diminished breath sounds bilaterally -Continue on bronchodilators -Continue inhaled steroids -Chest x-ray from admission does not show any pneumonia -Overall respiratory status appears to be improving,  better air movement today  Hypertension -Antihypertensives held on admission due to soft blood pressures -Continue to monitor  PAD -Continue on statin and Plavix  Hyperlipidemia -Continue statin  Generalized weakness -Will need physical therapy evaluation   DVT prophylaxis: enoxaparin (LOVENOX) injection 40 mg Start: 12/06/21 1000 SCDs Start: 12/06/21 0128  Code Status: DNR Family Communication: Discussed with patient Disposition Plan: Status is: Inpatient  Remains inpatient appropriate because: Continued fevers and need for IV antibiotic therapy.         Consultants:    Procedures:    Antimicrobials:  Ceftriaxone 12/20 > 12/22 Meropenem 12/22 >   Subjective: Continues to have fevers through the day today.  Still has some confusion  Objective: Vitals:   12/07/21 1001 12/07/21 1131 12/07/21 1302 12/07/21 1955  BP:   120/62   Pulse:   73   Resp:   (!) 24   Temp: (!) 103.2 F (39.6 C) (!) 101.2 F (38.4 C) 98.5 F (36.9 C)   TempSrc: Rectal Rectal Oral   SpO2:   96% 93%  Weight:      Height:        Intake/Output Summary (Last 24 hours) at 12/07/2021 2059 Last data filed at 12/07/2021 1800 Gross per 24 hour  Intake 682.64 ml  Output 1100 ml  Net -417.36 ml   Filed Weights   12/06/21 0139  Weight: 64.8 kg    Examination:  General exam: Appears calm and comfortable  Respiratory system: Diminished breath sounds bilaterally Cardiovascular system: S1 & S2 heard, RRR. No JVD, murmurs, rubs, gallops or clicks. No pedal edema. Gastrointestinal system: Abdomen is nondistended, soft and nontender. No organomegaly or masses felt. Normal bowel sounds heard. Central  nervous system:  No focal neurological deficits. Extremities: Symmetric 5 x 5 power. Skin: No rashes, lesions or ulcers Psychiatry: Appears to have some confusion    Data Reviewed: I have personally reviewed following labs and imaging studies  CBC: Recent Labs  Lab 12/05/21 2036  12/06/21 0453 12/07/21 0444  WBC 17.2* 9.6 8.1  HGB 14.5 12.1* 10.9*  HCT 43.9 36.6* 33.7*  MCV 85.9 86.7 86.0  PLT 275 200 921   Basic Metabolic Panel: Recent Labs  Lab 12/05/21 2036 12/06/21 0453 12/07/21 0444  NA 132* 134* 132*  K 4.0 3.8 3.5  CL 96* 102 101  CO2 26 23 23   GLUCOSE 194* 130* 199*  BUN 24* 21 27*  CREATININE 1.31* 1.00 1.08  CALCIUM 8.6* 7.9* 8.0*  MG  --  1.9  --   PHOS  --  3.7  --    GFR: Estimated Creatinine Clearance: 57.5 mL/min (by C-G formula based on SCr of 1.08 mg/dL). Liver Function Tests: Recent Labs  Lab 12/05/21 2036 12/06/21 0453 12/07/21 0444  AST 27 17 20   ALT 44 32 27  ALKPHOS 80 59 53  BILITOT 0.8 0.6 0.4  PROT 7.8 6.0* 5.7*  ALBUMIN 2.8* 2.2* 2.0*   No results for input(s): LIPASE, AMYLASE in the last 168 hours. No results for input(s): AMMONIA in the last 168 hours. Coagulation Profile: No results for input(s): INR, PROTIME in the last 168 hours. Cardiac Enzymes: No results for input(s): CKTOTAL, CKMB, CKMBINDEX, TROPONINI in the last 168 hours. BNP (last 3 results) No results for input(s): PROBNP in the last 8760 hours. HbA1C: No results for input(s): HGBA1C in the last 72 hours. CBG: No results for input(s): GLUCAP in the last 168 hours. Lipid Profile: No results for input(s): CHOL, HDL, LDLCALC, TRIG, CHOLHDL, LDLDIRECT in the last 72 hours. Thyroid Function Tests: No results for input(s): TSH, T4TOTAL, FREET4, T3FREE, THYROIDAB in the last 72 hours. Anemia Panel: No results for input(s): VITAMINB12, FOLATE, FERRITIN, TIBC, IRON, RETICCTPCT in the last 72 hours. Sepsis Labs: Recent Labs  Lab 12/05/21 2036 12/05/21 2256  LATICACIDVEN 2.4* 1.4    Recent Results (from the past 240 hour(s))  Urine Culture     Status: Abnormal (Preliminary result)   Collection Time: 12/05/21  8:21 PM   Specimen: In/Out Cath Urine  Result Value Ref Range Status   Specimen Description   Final    IN/OUT CATH URINE Performed at  [facility], [address] [place], [place] [address]    Special Requests   Final    NONE Performed at [facility], [address] [place], [place] [address]    Culture (A)  Final    >=100,000 COLONIES/mL PSEUDOMONAS AERUGINOSA 10,000 COLONIES/mL ENTEROCOCCUS FAECIUM SUSCEPTIBILITIES TO FOLLOW Performed at [facility], [place] [address] [place], [place] [address]    Report Status PENDING  Incomplete  Blood culture (routine x 2)     Status: None (Preliminary result)   Collection Time: 12/05/21  8:36 PM   Specimen: BLOOD LEFT FOREARM  Result Value Ref Range Status   Specimen Description BLOOD LEFT FOREARM  Final   Special Requests   Final    BOTTLES DRAWN AEROBIC AND ANAEROBIC Blood Culture adequate volume   Culture   Final    NO GROWTH 2 DAYS Performed at [facility], [address], [place], [place] [address]    Report Status PENDING  Incomplete  Blood culture (routine x 2)     Status: None (Preliminary  result)   Collection Time: 12/05/21  8:36 PM   Specimen: Right Antecubital; Blood  Result Value Ref Range Status   Specimen Description   Final    RIGHT ANTECUBITAL Performed at [facility], [address], [place], [place] [address]    Special Requests   Final    BOTTLES DRAWN AEROBIC AND ANAEROBIC Blood Culture results may not be optimal due to an inadequate volume of blood received in culture bottles Performed at [facility], [address], [place], [place] [address]    Culture  Setup Time   Final    GRAM POSITIVE COCCI AEROBIC BOTTLE ONLY CRITICAL RESULT CALLED TO, READ BACK BY AND VERIFIED WITH: J JONES,RN@[id number]  12/07/21 [place] Performed at [facility], [place] [address], [place], [place] [address]    Culture   Final    NO GROWTH 2 DAYS Performed at [facility], [address] [place], [place] [address]    Report Status PENDING  Incomplete  Resp Panel by RT-PCR (Flu A&B, Covid) Nasopharyngeal Swab     Status: None   Collection Time: 12/05/21  8:36 PM    Specimen: Nasopharyngeal Swab; Nasopharyngeal(NP) swabs in vial transport medium  Result Value Ref Range Status   SARS Coronavirus 2 by RT PCR NEGATIVE NEGATIVE Final    Comment: (NOTE) SARS-CoV-2 target nucleic acids are NOT DETECTED.  The SARS-CoV-2 RNA is generally detectable in upper respiratory specimens during the acute phase of infection. The lowest concentration of SARS-CoV-2 viral copies this assay can detect is 138 copies/mL. A negative result does not preclude SARS-Cov-2 infection and should not be used as the sole basis for treatment or other patient management decisions. A negative result may occur with  improper specimen collection/handling, submission of specimen other than nasopharyngeal swab, presence of viral mutation(s) within the areas targeted by this assay, and inadequate number of viral copies(<138 copies/mL). A negative result must be combined with clinical observations, patient history, and epidemiological information. The expected result is Negative.  Fact Sheet for Patients:  EntrepreneurPulse.com.au  Fact Sheet for Healthcare Providers:  IncredibleEmployment.be  This test is no t yet approved or cleared by the [place] FDA and  has been authorized for detection and/or diagnosis of SARS-CoV-2 by FDA under an Emergency Use Authorization (EUA). This EUA will remain  in effect (meaning this test can be used) for the duration of the COVID-19 declaration under Section 564(b)(1) of the Act, 21 U.S.C.section 360bbb-3(b)(1), unless the authorization is terminated  or revoked sooner.       Influenza A by PCR NEGATIVE NEGATIVE Final   Influenza B by PCR NEGATIVE NEGATIVE Final    Comment: (NOTE) The Xpert Xpress SARS-CoV-2/FLU/RSV plus assay is intended as an aid in the diagnosis of influenza from Nasopharyngeal swab specimens and should not be used as a sole basis for treatment. Nasal washings and aspirates are  unacceptable for Xpert Xpress SARS-CoV-2/FLU/RSV testing.  Fact Sheet for Patients: EntrepreneurPulse.com.au  Fact Sheet for Healthcare Providers: IncredibleEmployment.be  This test is not yet approved or cleared by the [place] FDA and has been authorized for detection and/or diagnosis of SARS-CoV-2 by FDA under an Emergency Use Authorization (EUA). This EUA will remain in effect (meaning this test can be used) for the duration of the COVID-19 declaration under Section 564(b)(1) of the Act, 21 U.S.C. section 360bbb-3(b)(1), unless the authorization is terminated or revoked.  Performed at [facility], [address], [place], [place] [address]   Blood Culture ID Panel (Reflexed)  Status: Abnormal   Collection Time: 12/05/21  8:36 PM  Result Value Ref Range Status   Enterococcus faecalis NOT DETECTED NOT DETECTED Final   Enterococcus Faecium NOT DETECTED NOT DETECTED Final   Listeria monocytogenes NOT DETECTED NOT DETECTED Final   Staphylococcus species DETECTED (A) NOT DETECTED Final    Comment: CRITICAL RESULT CALLED TO, READ BACK BY AND VERIFIED WITH: J JONES,RN@[id number]  12/07/21 [place]    Staphylococcus aureus (BCID) NOT DETECTED NOT DETECTED Final   Staphylococcus epidermidis NOT DETECTED NOT DETECTED Final   Staphylococcus lugdunensis NOT DETECTED NOT DETECTED Final   Streptococcus species NOT DETECTED NOT DETECTED Final   Streptococcus agalactiae NOT DETECTED NOT DETECTED Final   Streptococcus pneumoniae NOT DETECTED NOT DETECTED Final   Streptococcus pyogenes NOT DETECTED NOT DETECTED Final   A.calcoaceticus-baumannii NOT DETECTED NOT DETECTED Final   Bacteroides fragilis NOT DETECTED NOT DETECTED Final   Enterobacterales NOT DETECTED NOT DETECTED Final   Enterobacter cloacae complex NOT DETECTED NOT DETECTED Final   Escherichia coli NOT DETECTED NOT DETECTED Final   Klebsiella aerogenes NOT DETECTED NOT DETECTED Final   Klebsiella  oxytoca NOT DETECTED NOT DETECTED Final   Klebsiella pneumoniae NOT DETECTED NOT DETECTED Final   Proteus species NOT DETECTED NOT DETECTED Final   Salmonella species NOT DETECTED NOT DETECTED Final   Serratia marcescens NOT DETECTED NOT DETECTED Final   Haemophilus influenzae NOT DETECTED NOT DETECTED Final   Neisseria meningitidis NOT DETECTED NOT DETECTED Final   Pseudomonas aeruginosa NOT DETECTED NOT DETECTED Final   Stenotrophomonas maltophilia NOT DETECTED NOT DETECTED Final   Candida albicans NOT DETECTED NOT DETECTED Final   Candida auris NOT DETECTED NOT DETECTED Final   Candida glabrata NOT DETECTED NOT DETECTED Final   Candida krusei NOT DETECTED NOT DETECTED Final   Candida parapsilosis NOT DETECTED NOT DETECTED Final   Candida tropicalis NOT DETECTED NOT DETECTED Final   Cryptococcus neoformans/gattii NOT DETECTED NOT DETECTED Final    Comment: Performed at [facility], [address], [place], [place] [address]  Culture, blood (routine x 2)     Status: None (Preliminary result)   Collection Time: 12/07/21 10:45 AM   Specimen: BLOOD  Result Value Ref Range Status   Specimen Description BLOOD RIGHT ANTECUBITAL  Final   Special Requests   Final    BOTTLES DRAWN AEROBIC AND ANAEROBIC Blood Culture adequate volume Performed at [facility], [address] [place], [place] [address]    Culture PENDING  Incomplete   Report Status PENDING  Incomplete  Culture, blood (routine x 2)     Status: None (Preliminary result)   Collection Time: 12/07/21 10:50 AM   Specimen: BLOOD  Result Value Ref Range Status   Specimen Description BLOOD BLOOD LEFT HAND  Final   Special Requests   Final    BOTTLES DRAWN AEROBIC AND ANAEROBIC Blood Culture adequate volume Performed at [facility], [address] [place], [place] [address]    Culture PENDING  Incomplete   Report Status PENDING  Incomplete         Radiology Studies: CT Abdomen Pelvis W Contrast  Result Date:  12/05/2021 CLINICAL DATA:  Right lower quadrant abdominal pain EXAM: CT ABDOMEN AND PELVIS WITH CONTRAST TECHNIQUE: Multidetector CT imaging of the abdomen and pelvis was performed using the standard protocol following bolus administration of intravenous contrast. CONTRAST:  144mL OMNIPAQUE IOHEXOL 300 MG/ML  SOLN COMPARISON:  11/21/2021 FINDINGS: Lower chest: Emphysematous changes are noted within the lung bases. Cardiac size  within normal limits. Hepatobiliary: Focal fatty sparing within the gallbladder fossa. No enhancing intrahepatic mass identified. Status post cholecystectomy. No biliary dilatation. Pancreas: Unremarkable Spleen: Unremarkable Adrenals/Urinary Tract: The adrenal glands are unremarkable. The kidneys are normal in position. There is asymmetric cortical atrophy involving the upper pole the left kidney. There is an associated irregular, thick-walled cystic lesion within the upper pole demonstrating lateral mural calcification which appears unchanged from remote prior examination of 06/23/2017 is likely benign given its stability over time. Simple cortical cyst noted within the lower pole of the left kidney and upper pole the right kidney. Multiple nonobstructing renal calculi are noted within the kidneys bilaterally, stable since prior examination. Right double-J ureteral stent has been placed in the interval since prior examination extending from the right renal pelvis to the bladder. Previously noted trace hydronephrosis has resolved. There has developed mild right perinephric stranding which tracks into the right posterior pararenal space which may be postprocedural in nature or relate to superimposed inflammation as can be seen with pyelonephritis. Stable 5 and 14 mm calculi within the distal right ureter. No ureteral calculi on the left. No hydronephrosis on the left. The bladder is mildly thick walled suggesting changes of chronic bladder outlet obstruction, unchanged from multiple prior  examinations. The bladder is not distended Stomach/Bowel: Stomach is within normal limits. Appendix appears normal. No evidence of bowel wall thickening, distention, or inflammatory changes. Vascular/Lymphatic: 3.0 cm infrarenal fusiform abdominal aortic aneurysm demonstrating moderate mural thrombus is again identified, stable since immediate prior examination and increased since remote prior examination of 06/23/2017 where this measured 2.6 cm. Right common iliac artery stent grafting has been performed with thrombosis of the excluded right common iliac artery aneurysm sac. Right external iliac artery has been stented. Thrombosis of a right common femoral-distal bypass graft is partially visualized. Thrombosis of right superficial femoral arteries partially visualized. Ectasia of left common iliac artery is unchanged. 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 No pathologic adenopathy within the abdomen and pelvis. Reproductive: Moderate prostatic enlargement with indentation of the bladder base is again noted. Other: No abdominal wall hernia.  Rectum unremarkable. Musculoskeletal: No acute bone abnormality. No lytic or blastic bone lesion. IMPRESSION: Interval right double-J ureteral stent placement with resolution of previously noted right hydronephrosis. Right ureteral stones are unchanged in position, however, measuring up to 14 mm within the distal right ureter. There has developed mild right perinephric inflammatory stranding which may be postprocedural in nature or relate to a superimposed inflammatory process such as pyelonephritis. Correlation with urinalysis may be helpful. Stable bilateral nonobstructing nephrolithiasis. Unchanged complex cystic lesion within the upper pole the left kidney, stable since remote prior examination of 06/23/2017. Emphysema. 3 cm infrarenal abdominal aortic aneurysm. Recommend follow-up every 3 years. Reference: J Am Coll Radiol  9528;41:324-401. Peripheral vascular disease with bilateral lower extremity outflow disease, incompletely assessed on this examination. Moderate prostatic enlargement. Stable circumferential bladder wall thickening likely reflecting changes of chronic bladder outlet obstruction. No superimposed bladder distension. Aortic Atherosclerosis (ICD10-I70.0) and Emphysema (ICD10-J43.9). Aortic aneurysm NOS (ICD10-I71.9). Electronically Signed   By: Fidela Salisbury M.D.   On: 12/05/2021 22:41   DG CHEST PORT 1 VIEW  Result Date: 12/07/2021 CLINICAL DATA:  Altered mental status, fever, COPD, hypertension EXAM: PORTABLE CHEST 1 VIEW COMPARISON:  12/05/2021 FINDINGS: Normal heart size, mediastinal contours, and pulmonary vascularity. Lungs clear. No pulmonary infiltrate, pleural effusion, or pneumothorax. Bones demineralized. IMPRESSION: No acute abnormalities. Electronically Signed   By: Elta Gu[place]  Thornton Papas M.D.   On: 12/07/2021 14:09        Scheduled Meds:  budesonide (PULMICORT) nebulizer solution  0.25 mg Nebulization BID   clopidogrel  75 mg Oral Daily   enoxaparin (LOVENOX) injection  40 mg Subcutaneous Q24H   feeding supplement  237 mL Oral TID BM   ipratropium-albuterol  3 mL Nebulization Q6H WA   melatonin  6 mg Oral QHS   pantoprazole  40 mg Oral Daily   rosuvastatin  20 mg Oral Daily   Continuous Infusions:  meropenem (MERREM) IV 1 g (12/07/21 1157)     LOS: 2 days    Time spent: 35 minutes    Kathie Dike, MD [facility]   If 7PM-7AM, please contact night-coverage [email or web address]  12/07/2021, 8:59 PM

## 2021-12-07 NOTE — TOC Initial Note (Signed)
Transition of Care (TOC) - Initial/Assessment Note    Patient Details  Name: Raymond Walker. MRN: [id number] Date of Birth: March 07, 1951  Transition of Care [facility]) CM/SW Contact:    Ihor Gully, LCSW Phone Number: 12/07/2021, 2:59 PM  Clinical Narrative:                 Patient from home alone. Ambulates independently at baseline, drives, maintains appointments. Admitted for sepsis. Recommended for SNF. Declines SNF. Discussed who will care for him at d/c and he responded "I'll figure that out when I get home." States that his family is a support to him. Agreeable to HHPT.   Expected Discharge Plan: [place] Barriers to Discharge: Continued Medical Work up   Patient Goals and CMS Choice     Choice offered to / list presented to : Patient  Expected Discharge Plan and Services Expected Discharge Plan: [place]       Living arrangements for the past 2 months: Single Family Home                                      Prior Living Arrangements/Services Living arrangements for the past 2 months: Single Family Home Lives with:: Self Patient language and need for interpreter reviewed:: Yes        Need for Family Participation in Patient Care: Yes (Comment) Care giver support system in place?: Yes (comment)      Activities of Daily Living Home Assistive Devices/Equipment: Environmental consultant (specify type), Shower chair without back ADL Screening (condition at time of admission) Patient's cognitive ability adequate to safely complete daily activities?: Yes Is the patient deaf or have difficulty hearing?: No Does the patient have difficulty seeing, even when wearing glasses/contacts?: No Does the patient have difficulty concentrating, remembering, or making decisions?: No Patient able to express need for assistance with ADLs?: Yes Does the patient have difficulty dressing or bathing?: Yes Independently performs ADLs?: No Communication:  Independent Dressing (OT): Needs assistance Is this a change from baseline?: Change from baseline, expected to last <3days Grooming: Needs assistance Is this a change from baseline?: Change from baseline, expected to last <3 days Feeding: Independent Bathing: Needs assistance Is this a change from baseline?: Change from baseline, expected to last <3 days Toileting: Needs assistance Is this a change from baseline?: Change from baseline, expected to last <3 days In/Out Bed: Needs assistance Is this a change from baseline?: Change from baseline, expected to last >3 days Walks in Home: Needs assistance Is this a change from baseline?: Change from baseline, expected to last <3 days Does the patient have difficulty walking or climbing stairs?: Yes Weakness of Legs: Both Weakness of Arms/Hands: None  Permission Sought/Granted                  Emotional Assessment Appearance:: Appears younger than stated age   Affect (typically observed): Appropriate Orientation: : Oriented to Self, Oriented to Place, Oriented to  Time, Oriented to Situation Alcohol / Substance Use: Not Applicable Psych Involvement: No (comment)  Admission diagnosis:  Acute UTI [N39.0] Generalized weakness [R53.1] AKI (acute kidney injury) ([place]) [N17.9] Sepsis secondary to UTI ([place]) [A41.9, N39.0] Elevated lactic acid level [R79.89] Sepsis ([place]) [A41.9] Altered mental status, unspecified altered mental status type [R41.82] Patient Active Problem List   Diagnosis Date Noted   Sepsis secondary to UTI ([place]) 12/06/2021  Leukocytosis 12/06/2021   Hypoalbuminemia due to protein-calorie malnutrition ([place]) 12/06/2021   Lactic acidosis 12/06/2021   AKI (acute kidney injury) ([place]) 12/06/2021   Essential hypertension 12/06/2021   Mixed hyperlipidemia 12/06/2021   GERD (gastroesophageal reflux disease) 12/06/2021   Kidney stone on right side 11/21/2021   Low back pain 09/19/2021   Cognitive dysfunction 06/23/2020    History of seizures 05/31/2020   Erectile dysfunction 05/02/2020   Caregiver with fatigue 05/02/2020   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[place])    Goals of care, counseling/discussion    Palliative care by specialist    DNR (do not resuscitate)    Vitamin D deficiency 01/24/2020   Sepsis ([place]) 01/23/2020   Altered mental status    Hypokalemia    History of kidney stones    Hematuria    UTI (urinary tract infection)    CHRONIC Bladder outlet obstruction    PAD (peripheral artery disease) ([place]) 09/15/2019   Protein-calorie malnutrition, severe 08/21/2019   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 [place] [address] Phone: [phone] Fax: [phone]     Social Determinants of Health (SDOH) Interventions    Readmission Risk Interventions No flowsheet data found.

## 2021-12-07 NOTE — Progress Notes (Signed)
°   12/07/21 1001  Assess: MEWS Score  Temp (!) 103.2 F (39.6 C)  Assess: MEWS Score  MEWS Temp 2  MEWS Systolic 0  MEWS Pulse 0  MEWS RR 0  MEWS LOC 0  MEWS Score 2  MEWS Score Color Yellow  Assess: if the MEWS score is Yellow or Red  Were vital signs taken at a resting state? Yes  Focused Assessment No change from prior assessment  Early Detection of Sepsis Score *See Row Information* Medium  MEWS guidelines implemented *See Row Information* No, vital signs rechecked  Treat  MEWS Interventions Escalated (See documentation below)  Pain Scale 0-10  Pain Score 0  Take Vital Signs  Increase Vital Sign Frequency  Yellow: Q 2hr X 2 then Q 4hr X 2, if remains yellow, continue Q 4hrs  Escalate  MEWS: Escalate Yellow: discuss with charge nurse/RN and consider discussing with provider and RRT  Notify: Charge Nurse/RN  Name of Charge Nurse/RN Notified Raquel Sarna RN  Date Charge Nurse/RN Notified 12/07/21  Time Charge Nurse/RN Notified 1005  Notify: Provider  Provider Name/Title Dr. Roderic Palau  Date Provider Notified 12/07/21  Time Provider Notified 1005  Notification Type Page  Notification Reason Other (Comment) (elevated temp)  Provider response See new orders  Date of Provider Response 12/07/21  Time of Provider Response 1007

## 2021-12-07 NOTE — Progress Notes (Signed)
°   12/07/21 0016  Vitals  Temp (!) 102.9 F (39.4 C)  Temp Source Rectal  BP (!) 108/54  MAP (mmHg) 71  BP Location Left Arm  BP Method Automatic  Patient Position (if appropriate) Lying  Pulse Rate 90  Pulse Rate Source Monitor  Resp 20  MEWS COLOR  MEWS Score Color Yellow  Oxygen Therapy  SpO2 95 %  O2 Device Room Air  Pain Assessment  Pain Scale 0-10  Pain Score 0  MEWS Score  MEWS Temp 2  MEWS Systolic 0  MEWS Pulse 0  MEWS RR 0  MEWS LOC 0  MEWS Score 2  Provider Notification  Provider Name/Title Dr.Zierle-Ghosh  Date Provider Notified 12/07/21  Time Provider Notified 0024  Notification Type Page  Notification Reason Other (Comment) ([place]ellow [place])    Patient last given Tylenol 650mg  PO on 12/06/2021 at 2310. MD notified.

## 2021-12-07 NOTE — Plan of Care (Signed)
°  Problem: Acute Rehab PT Goals(only PT should resolve) Goal: Pt Will Go Supine/Side To Sit Outcome: Progressing Flowsheets (Taken 12/07/2021 1425) Pt will go Supine/Side to Sit: with minimal assist Goal: Patient Will Transfer Sit To/From Stand Outcome: Progressing Flowsheets (Taken 12/07/2021 1425) Patient will transfer sit to/from stand: with minimal assist Goal: Pt Will Transfer Bed To Chair/Chair To Bed Outcome: Progressing Flowsheets (Taken 12/07/2021 1425) Pt will Transfer Bed to Chair/Chair to Bed: with min assist Goal: Pt Will Ambulate Outcome: Progressing Flowsheets (Taken 12/07/2021 1425) Pt will Ambulate:  50 feet  with minimal assist  with moderate assist  with rolling walker   2:26 PM, 12/07/21 Lonell Grandchild, MPT Physical Therapist with [facility] [phone] office [phone] mobile phone

## 2021-12-07 NOTE — Progress Notes (Signed)
Pharmacy Antibiotic Note  Raymond Walker. is a 70 y.o. male admitted on 12/05/2021 with UTI.  Pharmacy has been consulted for Merrem dosing. Concern for pyelonephritis and elevated temps  Plan: Merrem 1gm IV q8h F/U cxs and clinical progress Monitor V/S, labs  Height: 6' (182.9 cm) Weight: 64.8 kg (142 lb 13.7 oz) IBW/kg (Calculated) : 77.6  Temp (24hrs), Avg:100.1 F (37.8 C), Min:97.4 F (36.3 C), Max:103.2 F (39.6 C)  Recent Labs  Lab 12/05/21 2036 12/05/21 2256 12/06/21 0453 12/07/21 0444  WBC 17.2*  --  9.6 8.1  CREATININE 1.31*  --  1.00 1.08  LATICACIDVEN 2.4* 1.4  --   --     Estimated Creatinine Clearance: 57.5 mL/min (by C-G formula based on SCr of 1.08 mg/dL).    No Known Allergies  Antimicrobials this admission: Merrem 12/22 >>  Ceftriaxone 12/21 >> 12/22  Microbiology results: 12/22 BCx: pending 12/20 BCX: +GPC, staph species in 1 bottle, ? Contaminant. D/W MD 12/20 UCX: pending 12/7 MRSA PCR is neg  Thank you for allowing pharmacy to be a part of this patients care.  Isac Sarna, BS Pharm D, [place] Clinical Pharmacist Pager [phone] 12/07/2021 11:03 AM

## 2021-12-07 NOTE — Progress Notes (Signed)
Lab called with critical lab value. On call notified. No new orders at this  time.Will continue to monitor. Staphylococcus species NOT DETECTED DETECTED Abnormal    Comment: CRITICAL RESULT CALLED TO, READ BACK BY AND VERIFIED WITH:  Tillman Sers [facility] 08/20/19 2005 JDW   Staphylococcus aureus (BCID) NOT DETECTED DETECTED Abnormal    Comment: Methicillin (oxacillin) susceptible Staphylococcus aureus (MSSA). Preferred therapy is anti staphylococcal beta lactam antibiotic (Cefazolin or Nafcillin), unless clinically contraindicated.  CRITICAL RESULT CALLED TO, READ BACK BY AND VERIFIED WITH:

## 2021-12-07 NOTE — Progress Notes (Signed)
Toradol 30 mg/ml IV once ordered and administered.

## 2021-12-07 NOTE — Evaluation (Signed)
Physical Therapy Evaluation Patient Details Name: Raymond Walker. MRN: [id number] DOB: Jan 26, 1951 Today's Date: 12/07/2021  History of Present Illness  Raymond Walker. is a 70 y.o. male with medical history significant for COPD, PAD, prior history of kidney stones who presents to the emergency department via EMS due to altered mental status.  Patient was unable to provide history, history was obtained from ED physician and ED medical record.  Per report, patient was recently admitted from 12/6-12/8 due to sepsis secondary to UTI with CT showing numerous obstructing kidney stones on the right status post cystoscopy and placement of double-J stent in the right ureter.  5 days of oral Keflex and follow-up as an outpatient for elective management of multiple kidney stones was advised.  It appeared that patient was not compliant with post admission treatment/plan.  A friend went to check on him today and he was in bed confused, incontinent and weak, he complained of right flank pain.  EMS was activated and patient was taken to the ED for further evaluation and management.  Patient lives at home alone and able to perform IADLs at baseline.   Clinical Impression  Patient requires much encouragement demonstrates slow labored movement for sitting up at bedside and requiring repeated verbal/tactile cueing to participate due apprehension and feeling cold, "per patient."  Patient on room air with SpO2 at 82%, that later increased to 87% - RN notified and patient put on 2 LPM.  Patient declined to attempt sit to stands or transferring to chair.  Patient will benefit from continued skilled physical therapy in hospital and recommended venue below to increase strength, balance, endurance for safe ADLs and gait.         Recommendations for follow up therapy are one component of a multi-disciplinary discharge planning process, led by the attending physician.  Recommendations may be updated based on patient status,  additional functional criteria and insurance authorization.  Follow Up Recommendations Skilled nursing-short term rehab (<3 hours/day)    Assistance Recommended at Discharge Intermittent Supervision/Assistance  Functional Status Assessment Patient has had a recent decline in their functional status and demonstrates the ability to make significant improvements in function in a reasonable and predictable amount of time.  Equipment Recommendations  None recommended by PT    Recommendations for Other Services       Precautions / Restrictions Precautions Precautions: Fall Restrictions Weight Bearing Restrictions: No      Mobility  Bed Mobility Overal bed mobility: Needs Assistance Bed Mobility: Supine to Sit;Sit to Supine     Supine to sit: Mod assist;Max assist Sit to supine: Mod assist   General bed mobility comments: slow labored movement, very apprehensive due to c/o of feeling cold    Transfers                        Ambulation/Gait                  Stairs            Wheelchair Mobility    Modified Rankin (Stroke Patients Only)       Balance Overall balance assessment: Needs assistance Sitting-balance support: Feet supported;No upper extremity supported Sitting balance-Leahy Scale: Poor Sitting balance - Comments: fair/poor seated at EOB  Pertinent Vitals/Pain Pain Assessment: No/denies pain    Home Living Family/patient expects to be discharged to:: Private residence Living Arrangements: Alone Available Help at Discharge: Family;Friend(s);Available PRN/intermittently Type of Home: House Home Access: Level entry   Entrance Stairs-Number of Steps: 4   Home Layout: One level Home Equipment: Rolling Walker (2 wheels);Grab bars - tub/shower;Shower seat      Prior Function Prior Level of Function : Independent/Modified Independent             Mobility Comments: Engineer, site, drives ADLs Comments: Independent     Hand Dominance   Dominant Hand: Right    Extremity/Trunk Assessment   Upper Extremity Assessment Upper Extremity Assessment: Generalized weakness    Lower Extremity Assessment Lower Extremity Assessment: Generalized weakness    Cervical / Trunk Assessment Cervical / Trunk Assessment: Normal  Communication   Communication: No difficulties  Cognition Arousal/Alertness: Awake/alert Behavior During Therapy: WFL for tasks assessed/performed Overall Cognitive Status: No family/caregiver present to determine baseline cognitive functioning                                          General Comments      Exercises     Assessment/Plan    PT Assessment Patient needs continued PT services  PT Problem List Decreased strength;Decreased activity tolerance;Decreased balance;Decreased mobility       PT Treatment Interventions DME instruction;Gait training;Stair training;Functional mobility training;Therapeutic exercise;Therapeutic activities;Balance training;Patient/family education    PT Goals (Current goals can be found in the Care Plan section)  Acute Rehab PT Goals Patient Stated Goal: return home PT Goal Formulation: With patient Time For Goal Achievement: 12/21/21 Potential to Achieve Goals: Good    Frequency Min 3X/week   Barriers to discharge        Co-evaluation               AM-PAC PT "6 Clicks" Mobility  Outcome Measure Help needed turning from your back to your side while in a flat bed without using bedrails?: A Lot Help needed moving from lying on your back to sitting on the side of a flat bed without using bedrails?: A Lot Help needed moving to and from a bed to a chair (including a wheelchair)?: Total Help needed standing up from a chair using your arms (e.g., wheelchair or bedside chair)?: Total Help needed to walk in hospital room?: Total Help needed climbing 3-5 steps with a  railing? : Total 6 Click Score: 8    End of Session Equipment Utilized During Treatment: Oxygen Activity Tolerance: Patient tolerated treatment well;Patient limited by fatigue Patient left: in bed;with call bell/phone within reach;with nursing/sitter in room Nurse Communication: Mobility status PT Visit Diagnosis: Unsteadiness on feet (R26.81);Other abnormalities of gait and mobility (R26.89);Muscle weakness (generalized) (M62.81)    Time: 4196-2229 PT Time Calculation (min) (ACUTE ONLY): 32 min   Charges:   PT Evaluation $PT Eval Moderate Complexity: 1 Mod PT Treatments $Therapeutic Activity: 23-37 mins        2:22 PM, 12/07/21 Lonell Grandchild, MPT Physical Therapist with [facility] [phone] office [phone] mobile phone

## 2021-12-08 DIAGNOSIS — N390 Urinary tract infection, site not specified: Secondary | ICD-10-CM | POA: Diagnosis not present

## 2021-12-08 DIAGNOSIS — R4182 Altered mental status, unspecified: Secondary | ICD-10-CM | POA: Diagnosis not present

## 2021-12-08 DIAGNOSIS — J449 Chronic obstructive pulmonary disease, unspecified: Secondary | ICD-10-CM | POA: Diagnosis not present

## 2021-12-08 DIAGNOSIS — I739 Peripheral vascular disease, unspecified: Secondary | ICD-10-CM | POA: Diagnosis not present

## 2021-12-08 LAB — BASIC METABOLIC PANEL
Anion gap: 9 (ref 5–15)
BUN: 18 mg/dL (ref 8–23)
CO2: 21 mmol/L — ABNORMAL LOW (ref 22–32)
Calcium: 8 mg/dL — ABNORMAL LOW (ref 8.9–10.3)
Chloride: 102 mmol/L (ref 98–111)
Creatinine, Ser: 1.06 mg/dL (ref 0.61–1.24)
GFR, Estimated: >60 mL/min (ref >60)
Glucose, Bld: 114 mg/dL — ABNORMAL HIGH (ref 70–99)
Potassium: 4.1 mmol/L (ref 3.5–5.1)
Sodium: 132 mmol/L — ABNORMAL LOW (ref 135–145)

## 2021-12-08 LAB — CBC
HCT: 37.6 % — ABNORMAL LOW (ref 39.0–52.0)
Hemoglobin: 12.1 g/dL — ABNORMAL LOW (ref 13.0–17.0)
MCH: 27.8 pg (ref 26.0–34.0)
MCHC: 32.2 g/dL (ref 30.0–36.0)
MCV: 86.4 fL (ref 80.0–100.0)
Platelets: 186 10*3/uL (ref 150–400)
RBC: 4.35 MIL/uL (ref 4.22–5.81)
RDW: 14.5 % (ref 11.5–15.5)
WBC: 8.3 10*3/uL (ref 4.0–10.5)
nRBC: 0 % (ref 0.0–0.2)

## 2021-12-08 LAB — CULTURE, BLOOD (ROUTINE X 2)

## 2021-12-08 NOTE — Progress Notes (Addendum)
Patient has a temp of 100.4. Tylenol 650mg  given. I will recheck temp to monitor effectiveness of tylenol. RR taken by CNA found to be 23. I rechecked RR its currently@ 19. I will continue to monitor patient

## 2021-12-08 NOTE — Progress Notes (Signed)
°   12/08/21 0958  Assess: MEWS Score  Temp 99.9 F (37.7 C)  BP 132/66  Pulse Rate 87  Resp (!) 30  SpO2 94 %  O2 Device Room Air  Assess: MEWS Score  MEWS Temp 0  MEWS Systolic 0  MEWS Pulse 0  MEWS RR 2  MEWS LOC 0  MEWS Score 2  MEWS Score Color Yellow  Assess: if the MEWS score is Yellow or Red  Were vital signs taken at a resting state? Yes  Focused Assessment Change from prior assessment (see assessment flowsheet)  Early Detection of Sepsis Score *See Row Information* Medium  MEWS guidelines implemented *See Row Information* Yes  Treat  MEWS Interventions Administered prn meds/treatments  Pain Scale 0-10  Take Vital Signs  Increase Vital Sign Frequency  Yellow: Q 2hr X 2 then Q 4hr X 2, if remains yellow, continue Q 4hrs  Escalate  MEWS: Escalate Yellow: discuss with charge nurse/RN and consider discussing with provider and RRT  Notify: Charge Nurse/RN  Name of Charge Nurse/RN Notified Raquel Sarna  Date Charge Nurse/RN Notified 12/08/21  Time Charge Nurse/RN Notified 1018  Notify: Provider  Provider Name/Title Dr. Roderic Palau  Date Provider Notified 12/08/21  Time Provider Notified 1022  Notification Type Face-to-face  Provider response Other (Comment) (recheck temp in 30 min.  Was only recently started on correct abx so he is not surprised.  Will see patient)  Date of Provider Response 12/08/21  Time of Provider Response 1024  Document  Patient Outcome Other (Comment) (being treated for condition)  Progress note created (see row info) Yes

## 2021-12-08 NOTE — Progress Notes (Signed)
°   12/08/21 1243  Assess: MEWS Score  Temp 98.5 F (36.9 C)  BP (!) 119/57 (Nurse Izora Gala Notified)  Pulse Rate 73  Resp 18  SpO2 100 %  O2 Device Room Air  Assess: MEWS Score  MEWS Temp 0  MEWS Systolic 0  MEWS Pulse 0  MEWS RR 0  MEWS LOC 0  MEWS Score 0  MEWS Score Color Green  Assess: if the MEWS score is Yellow or Red  Were vital signs taken at a resting state? Yes  Focused Assessment No change from prior assessment  Early Detection of Sepsis Score *See Row Information* Medium  MEWS guidelines implemented *See Row Information* No, previously yellow, continue vital signs every 4 hours  Treat  Pain Score 0  Notify: Charge Nurse/RN  Name of Charge Nurse/RN Notified Raquel Sarna  Date Charge Nurse/RN Notified 12/08/21  Time Charge Nurse/RN Notified 1505  Document  Patient Outcome Stabilized after interventions  Progress note created (see row info) Yes

## 2021-12-08 NOTE — Progress Notes (Signed)
PROGRESS NOTE    Raymond Walker.  WLN:[id number] DOB: 04/08/1951 DOA: 12/05/2021 PCP: [name], MD    Brief Narrative:  70 year old male with a history of COPD, PAD, prior history of kidney stones, who was recently in the hospital from 12 6-12 8 when he was noted to have obstructing right-sided renal stones and UTI.  He was seen by urology and underwent right ureter stent placement.  He was discharged with Keflex.  Unclear if he completed this antibiotic.  He returns to the hospital with altered mental status and fevers.  Noted to have recurrent infection and CT indicating right-sided pyelonephritis, but no evidence of obstructing stone/hydronephrosis.  Discussed with urology who recommended continued antibiotic therapy.  Follow-up cultures.   Assessment & Plan:   Active Problems:   COPD (chronic obstructive pulmonary disease) (HCC)   PAD (peripheral artery disease) (HCC)   Sepsis secondary to UTI (HCC)   Leukocytosis   Hypoalbuminemia due to protein-calorie malnutrition (HCC)   Lactic acidosis   AKI (acute kidney injury) ([place])   Essential hypertension   Mixed hyperlipidemia   GERD (gastroesophageal reflux disease)   Sepsis secondary to pyelonephritis -Admitted with fever, tachypnea, leukocytosis -Lactic acid elevated on admission -Urine culture appears to be positive for Pseudomonas -Continues to have fevers -Continue on meropenem for another 24 hours prior to transitioning to Cipro -He was noted to have 1 out of 2 positive blood cultures for staph hominis.  Suspect this may be a contaminant -Since he had continued fevers today, blood cultures were repeated today.  Acute metabolic encephalopathy -Suspect is related to underlying sepsis -Continue to monitor -ABG shows normal PCO2  COPD -Noted to have diminished breath sounds bilaterally -Continue on bronchodilators -Continue inhaled steroids -Chest x-ray from admission does not show any pneumonia -Overall  respiratory status appears to be improving, better air movement today  Hypertension -Antihypertensives held on admission due to soft blood pressures -Continue to monitor  PAD -Continue on statin and Plavix  Hyperlipidemia -Continue statin  Generalized weakness -Seen by physical therapy with recommendation for skilled nursing facility placement.  Although patient is refusing.   DVT prophylaxis: enoxaparin (LOVENOX) injection 40 mg Start: 12/06/21 1000 SCDs Start: 12/06/21 0128  Code Status: DNR Family Communication: Discussed with patient Disposition Plan: Status is: Inpatient  Remains inpatient appropriate because: Continued fevers and need for IV antibiotic therapy.         Consultants:    Procedures:    Antimicrobials:  Ceftriaxone 12/20 > 12/22 Meropenem 12/22 >   Subjective: Says that he is weak.  He does not want to go to skilled facility.  Wants to go home.  Cannot tell me what he will do to help get him around.  Says that he has a procedure scheduled with urology in the beginning of January.  Objective: Vitals:   12/08/21 0958 12/08/21 1243 12/08/21 1428 12/08/21 1549  BP: 132/66 (!) 119/57    Pulse: 87 73    Resp: (!) 30 18  (!) 23  Temp: 99.9 F (37.7 C) 98.5 F (36.9 C)  100.2 F (37.9 C)  TempSrc:  Oral  Oral  SpO2: 94% 100% 94%   Weight:      Height:        Intake/Output Summary (Last 24 hours) at 12/08/2021 2019 Last data filed at 12/08/2021 1700 Gross per 24 hour  Intake 720 ml  Output 500 ml  Net 220 ml   Filed Weights   12/06/21 0139  Weight: 64.8  kg    Examination:  General exam: Appears calm and comfortable  Respiratory system: Diminished breath sounds bilaterally Cardiovascular system: S1 & S2 heard, RRR. No JVD, murmurs, rubs, gallops or clicks. No pedal edema. Gastrointestinal system: Abdomen is nondistended, soft and nontender. No organomegaly or masses felt. Normal bowel sounds heard. Central nervous system:  No  focal neurological deficits. Extremities: Symmetric 5 x 5 power. Skin: No rashes, lesions or ulcers Psychiatry: Easily agitated    Data Reviewed: I have personally reviewed following labs and imaging studies  CBC: Recent Labs  Lab 12/05/21 2036 12/06/21 0453 12/07/21 0444 12/08/21 0512  WBC 17.2* 9.6 8.1 8.3  HGB 14.5 12.1* 10.9* 12.1*  HCT 43.9 36.6* 33.7* 37.6*  MCV 85.9 86.7 86.0 86.4  PLT 275 200 194 443   Basic Metabolic Panel: Recent Labs  Lab 12/05/21 2036 12/06/21 0453 12/07/21 0444 12/08/21 0512  NA 132* 134* 132* 132*  K 4.0 3.8 3.5 4.1  CL 96* 102 101 102  CO2 26 23 23  21*  GLUCOSE 194* 130* 199* 114*  BUN 24* 21 27* 18  CREATININE 1.31* 1.00 1.08 1.06  CALCIUM 8.6* 7.9* 8.0* 8.0*  MG  --  1.9  --   --   PHOS  --  3.7  --   --    GFR: Estimated Creatinine Clearance: 58.6 mL/min (by C-G formula based on SCr of 1.06 mg/dL). Liver Function Tests: Recent Labs  Lab 12/05/21 2036 12/06/21 0453 12/07/21 0444  AST 27 17 20   ALT 44 32 27  ALKPHOS 80 59 53  BILITOT 0.8 0.6 0.4  PROT 7.8 6.0* 5.7*  ALBUMIN 2.8* 2.2* 2.0*   No results for input(s): LIPASE, AMYLASE in the last 168 hours. No results for input(s): AMMONIA in the last 168 hours. Coagulation Profile: No results for input(s): INR, PROTIME in the last 168 hours. Cardiac Enzymes: No results for input(s): CKTOTAL, CKMB, CKMBINDEX, TROPONINI in the last 168 hours. BNP (last 3 results) No results for input(s): PROBNP in the last 8760 hours. HbA1C: No results for input(s): HGBA1C in the last 72 hours. CBG: No results for input(s): GLUCAP in the last 168 hours. Lipid Profile: No results for input(s): CHOL, HDL, LDLCALC, TRIG, CHOLHDL, LDLDIRECT in the last 72 hours. Thyroid Function Tests: No results for input(s): TSH, T4TOTAL, FREET4, T3FREE, THYROIDAB in the last 72 hours. Anemia Panel: No results for input(s): VITAMINB12, FOLATE, FERRITIN, TIBC, IRON, RETICCTPCT in the last 72  hours. Sepsis Labs: Recent Labs  Lab 12/05/21 2036 12/05/21 2256  LATICACIDVEN 2.4* 1.4    Recent Results (from the past 240 hour(s))  Urine Culture     Status: Abnormal (Preliminary result)   Collection Time: 12/05/21  8:21 PM   Specimen: In/Out Cath Urine  Result Value Ref Range Status   Specimen Description IN/OUT CATH URINE  Final   Special Requests NONE  Final   Culture (A)  Final    >=100,000 COLONIES/mL PSEUDOMONAS AERUGINOSA 10,000 COLONIES/mL ENTEROCOCCUS FAECIUM SUSCEPTIBILITIES TO FOLLOW    Report Status PENDING  Incomplete   Organism ID, Bacteria PSEUDOMONAS AERUGINOSA (A)  Final      Susceptibility   Pseudomonas aeruginosa - MIC*    CEFTAZIDIME 4 SENSITIVE Sensitive     CIPROFLOXACIN <=0.25 SENSITIVE Sensitive     GENTAMICIN <=1 SENSITIVE Sensitive     IMIPENEM 1 SENSITIVE Sensitive     PIP/TAZO 8 SENSITIVE Sensitive     CEFEPIME Value in next row Sensitive      2 SENSITIVEPerformed  at [facility], [place] [address] [place], [place] [address]    * >=100,000 COLONIES/mL PSEUDOMONAS AERUGINOSA  Blood culture (routine x 2)     Status: None (Preliminary result)   Collection Time: 12/05/21  8:36 PM   Specimen: BLOOD LEFT FOREARM  Result Value Ref Range Status   Specimen Description BLOOD LEFT FOREARM  Final   Special Requests   Final    BOTTLES DRAWN AEROBIC AND ANAEROBIC Blood Culture adequate volume   Culture   Final    NO GROWTH 3 DAYS Performed at [facility], [address], [place], [place] [address]    Report Status PENDING  Incomplete  Blood culture (routine x 2)     Status: Abnormal   Collection Time: 12/05/21  8:36 PM   Specimen: Right Antecubital; Blood  Result Value Ref Range Status   Specimen Description   Final    RIGHT ANTECUBITAL Performed at [facility], [address] [place], [place] [address]    Special Requests   Final    BOTTLES DRAWN AEROBIC AND ANAEROBIC Blood Culture results may not be optimal due to an inadequate volume of  blood received in culture bottles Performed at [facility], [address], [place], [place] [address]    Culture  Setup Time   Final    GRAM POSITIVE COCCI AEROBIC BOTTLE ONLY CRITICAL RESULT CALLED TO, READ BACK BY AND VERIFIED WITH: J JONES,RN@[id number]  12/07/21 [place]    Culture (A)  Final    STAPHYLOCOCCUS HOMINIS THE SIGNIFICANCE OF ISOLATING THIS ORGANISM FROM A SINGLE SET OF BLOOD CULTURES WHEN MULTIPLE SETS ARE DRAWN IS UNCERTAIN. PLEASE NOTIFY THE MICROBIOLOGY DEPARTMENT WITHIN ONE WEEK IF SPECIATION AND SENSITIVITIES ARE REQUIRED. Performed at [facility], [place] [address] [place], [place] [address]    Report Status 12/08/2021 FINAL  Final  Resp Panel by RT-PCR (Flu A&B, Covid) Nasopharyngeal Swab     Status: None   Collection Time: 12/05/21  8:36 PM   Specimen: Nasopharyngeal Swab; Nasopharyngeal(NP) swabs in vial transport medium  Result Value Ref Range Status   SARS Coronavirus 2 by RT PCR NEGATIVE NEGATIVE Final    Comment: (NOTE) SARS-CoV-2 target nucleic acids are NOT DETECTED.  The SARS-CoV-2 RNA is generally detectable in upper respiratory specimens during the acute phase of infection. The lowest concentration of SARS-CoV-2 viral copies this assay can detect is 138 copies/mL. A negative result does not preclude SARS-Cov-2 infection and should not be used as the sole basis for treatment or other patient management decisions. A negative result may occur with  improper specimen collection/handling, submission of specimen other than nasopharyngeal swab, presence of viral mutation(s) within the areas targeted by this assay, and inadequate number of viral copies(<138 copies/mL). A negative result must be combined with clinical observations, patient history, and epidemiological information. The expected result is Negative.  Fact Sheet for Patients:  EntrepreneurPulse.com.au  Fact Sheet for Healthcare Providers:   IncredibleEmployment.be  This test is no t yet approved or cleared by the [place] FDA and  has been authorized for detection and/or diagnosis of SARS-CoV-2 by FDA under an Emergency Use Authorization (EUA). This EUA will remain  in effect (meaning this test can be used) for the duration of the COVID-19 declaration under Section 564(b)(1) of the Act, 21 U.S.C.section 360bbb-3(b)(1), unless the authorization is terminated  or revoked sooner.       Influenza A by PCR NEGATIVE NEGATIVE Final   Influenza B by PCR NEGATIVE NEGATIVE Final  Comment: (NOTE) The Xpert Xpress SARS-CoV-2/FLU/RSV plus assay is intended as an aid in the diagnosis of influenza from Nasopharyngeal swab specimens and should not be used as a sole basis for treatment. Nasal washings and aspirates are unacceptable for Xpert Xpress SARS-CoV-2/FLU/RSV testing.  Fact Sheet for Patients: EntrepreneurPulse.com.au  Fact Sheet for Healthcare Providers: IncredibleEmployment.be  This test is not yet approved or cleared by the [place] FDA and has been authorized for detection and/or diagnosis of SARS-CoV-2 by FDA under an Emergency Use Authorization (EUA). This EUA will remain in effect (meaning this test can be used) for the duration of the COVID-19 declaration under Section 564(b)(1) of the Act, 21 U.S.C. section 360bbb-3(b)(1), unless the authorization is terminated or revoked.  Performed at [facility], [address] [place], [place] [address]   Blood Culture ID Panel (Reflexed)     Status: Abnormal   Collection Time: 12/05/21  8:36 PM  Result Value Ref Range Status   Enterococcus faecalis NOT DETECTED NOT DETECTED Final   Enterococcus Faecium NOT DETECTED NOT DETECTED Final   Listeria monocytogenes NOT DETECTED NOT DETECTED Final   Staphylococcus species DETECTED (A) NOT DETECTED Final    Comment: CRITICAL RESULT CALLED TO, READ BACK BY AND  VERIFIED WITH: J JONES,RN@[id number]  12/07/21 [place]    Staphylococcus aureus (BCID) NOT DETECTED NOT DETECTED Final   Staphylococcus epidermidis NOT DETECTED NOT DETECTED Final   Staphylococcus lugdunensis NOT DETECTED NOT DETECTED Final   Streptococcus species NOT DETECTED NOT DETECTED Final   Streptococcus agalactiae NOT DETECTED NOT DETECTED Final   Streptococcus pneumoniae NOT DETECTED NOT DETECTED Final   Streptococcus pyogenes NOT DETECTED NOT DETECTED Final   A.calcoaceticus-baumannii NOT DETECTED NOT DETECTED Final   Bacteroides fragilis NOT DETECTED NOT DETECTED Final   Enterobacterales NOT DETECTED NOT DETECTED Final   Enterobacter cloacae complex NOT DETECTED NOT DETECTED Final   Escherichia coli NOT DETECTED NOT DETECTED Final   Klebsiella aerogenes NOT DETECTED NOT DETECTED Final   Klebsiella oxytoca NOT DETECTED NOT DETECTED Final   Klebsiella pneumoniae NOT DETECTED NOT DETECTED Final   Proteus species NOT DETECTED NOT DETECTED Final   Salmonella species NOT DETECTED NOT DETECTED Final   Serratia marcescens NOT DETECTED NOT DETECTED Final   Haemophilus influenzae NOT DETECTED NOT DETECTED Final   Neisseria meningitidis NOT DETECTED NOT DETECTED Final   Pseudomonas aeruginosa NOT DETECTED NOT DETECTED Final   Stenotrophomonas maltophilia NOT DETECTED NOT DETECTED Final   Candida albicans NOT DETECTED NOT DETECTED Final   Candida auris NOT DETECTED NOT DETECTED Final   Candida glabrata NOT DETECTED NOT DETECTED Final   Candida krusei NOT DETECTED NOT DETECTED Final   Candida parapsilosis NOT DETECTED NOT DETECTED Final   Candida tropicalis NOT DETECTED NOT DETECTED Final   Cryptococcus neoformans/gattii NOT DETECTED NOT DETECTED Final    Comment: Performed at [facility], [place]. [address], [place], [place] [address]  Culture, blood (routine x 2)     Status: None (Preliminary result)   Collection Time: 12/07/21 10:45 AM   Specimen: BLOOD  Result Value Ref Range Status    Specimen Description BLOOD RIGHT ANTECUBITAL  Final   Special Requests   Final    BOTTLES DRAWN AEROBIC AND ANAEROBIC Blood Culture adequate volume   Culture   Final    NO GROWTH < 24 HOURS Performed at [facility], [address] [place],  [address]    Report Status PENDING  Incomplete  Culture, blood (routine x 2)  Status: None (Preliminary result)   Collection Time: 12/07/21 10:50 AM   Specimen: BLOOD  Result Value Ref Range Status   Specimen Description BLOOD BLOOD LEFT HAND  Final   Special Requests   Final    BOTTLES DRAWN AEROBIC AND ANAEROBIC Blood Culture adequate volume   Culture   Final    NO GROWTH < 24 HOURS Performed at [facility], [address] [place], [place] [address]    Report Status PENDING  Incomplete         Radiology Studies: DG CHEST PORT 1 VIEW  Result Date: 12/07/2021 CLINICAL DATA:  Altered mental status, fever, COPD, hypertension EXAM: PORTABLE CHEST 1 VIEW COMPARISON:  12/05/2021 FINDINGS: Normal heart size, mediastinal contours, and pulmonary vascularity. Lungs clear. No pulmonary infiltrate, pleural effusion, or pneumothorax. Bones demineralized. IMPRESSION: No acute abnormalities. Electronically Signed   By: Lavonia Dana M.D.   On: 12/07/2021 14:09        Scheduled Meds:  budesonide (PULMICORT) nebulizer solution  0.25 mg Nebulization BID   clopidogrel  75 mg Oral Daily   enoxaparin (LOVENOX) injection  40 mg Subcutaneous Q24H   feeding supplement  237 mL Oral TID BM   ipratropium-albuterol  3 mL Nebulization Q6H WA   melatonin  6 mg Oral QHS   pantoprazole  40 mg Oral Daily   rosuvastatin  20 mg Oral Daily   Continuous Infusions:  meropenem (MERREM) IV 1 g (12/08/21 1427)     LOS: 3 days    Time spent: 35 minutes    Kathie Dike, MD [facility]   If 7PM-7AM, please contact night-coverage [email or web address]  12/08/2021, 8:19 PM

## 2021-12-08 NOTE — Progress Notes (Addendum)
Rude and agitated in general. Temp 100.2. Refused to take tylenol.

## 2021-12-08 NOTE — Care Management Important Message (Signed)
Important Message  Patient Details  Name: Raymond Walker. MRN: [id number] Date of Birth: [birth year]   Medicare Important Message Given:  Yes     Tommy Medal 12/08/2021, 12:53 PM

## 2021-12-09 DIAGNOSIS — N390 Urinary tract infection, site not specified: Secondary | ICD-10-CM | POA: Diagnosis not present

## 2021-12-09 DIAGNOSIS — R4182 Altered mental status, unspecified: Secondary | ICD-10-CM | POA: Diagnosis not present

## 2021-12-09 DIAGNOSIS — I739 Peripheral vascular disease, unspecified: Secondary | ICD-10-CM | POA: Diagnosis not present

## 2021-12-09 DIAGNOSIS — J449 Chronic obstructive pulmonary disease, unspecified: Secondary | ICD-10-CM | POA: Diagnosis not present

## 2021-12-09 LAB — URINE CULTURE: Culture: >=100000 — AB

## 2021-12-09 MED ORDER — IPRATROPIUM-ALBUTEROL 0.5-2.5 (3) MG/3ML IN SOLN
3.0000 mL | Freq: Two times a day (BID) | RESPIRATORY_TRACT | Status: DC
Start: 2021-12-09 — End: 2021-12-13
  Administered 2021-12-09: 20:00:00 3 mL via RESPIRATORY_TRACT
  Filled 2021-12-09 (×7): qty 3

## 2021-12-09 NOTE — Progress Notes (Signed)
PROGRESS NOTE    Raymond Walker.  XNA:[id number] DOB: 1951/03/19 DOA: 12/05/2021 PCP: [name], MD    Brief Narrative:  70 year old male with a history of COPD, PAD, prior history of kidney stones, who was recently in the hospital from 12 6-12 8 when he was noted to have obstructing right-sided renal stones and UTI.  He was seen by urology and underwent right ureter stent placement.  He was discharged with Keflex.  Unclear if he completed this antibiotic.  He returns to the hospital with altered mental status and fevers.  Noted to have recurrent infection and CT indicating right-sided pyelonephritis, but no evidence of obstructing stone/hydronephrosis.  Discussed with urology who recommended continued antibiotic therapy.  Follow-up cultures.   Assessment & Plan:   Active Problems:   COPD (chronic obstructive pulmonary disease) (HCC)   PAD (peripheral artery disease) (HCC)   Sepsis secondary to UTI (HCC)   Leukocytosis   Hypoalbuminemia due to protein-calorie malnutrition (HCC)   Lactic acidosis   AKI (acute kidney injury) ([place])   Essential hypertension   Mixed hyperlipidemia   GERD (gastroesophageal reflux disease)   Sepsis secondary to pyelonephritis -Admitted with fever, tachypnea, leukocytosis -Lactic acid elevated on admission -Urine culture appears to be positive for Pseudomonas -Continues to have fevers -Continue on meropenem for another 24 hours prior to transitioning to Cipro -He was noted to have 1 out of 2 positive blood cultures for staph hominis.  Suspect this may be a contaminant -Overall fever curve does appear to be improving -Urine culture also indicated 10,000 colonies of Enterococcus -If fever continues to resolve, unlikely that this would need to be treated.  Acute metabolic encephalopathy -Suspect is related to underlying sepsis -Continue to monitor -ABG shows normal PCO2  COPD -Noted to have diminished breath sounds bilaterally -Continue on  bronchodilators -Continue inhaled steroids -Chest x-ray from admission does not show any pneumonia -Overall respiratory status appears to be improving, better air movement today  Hypertension -Antihypertensives held on admission due to soft blood pressures -Continue to monitor  PAD -Continue on statin and Plavix  Hyperlipidemia -Continue statin  Generalized weakness -Seen by physical therapy with recommendation for skilled nursing facility placement.  Although patient is refusing. -Discussed with his power of attorney, who agreed that it discharge to skilled nursing facility would be in patient's best interest -She plans on discussing this further with patient and feels as though patient will likely agree after speaking to her   DVT prophylaxis: enoxaparin (LOVENOX) injection 40 mg Start: 12/06/21 1000 SCDs Start: 12/06/21 0128  Code Status: DNR Family Communication: Discussed with patient.  Updated patient's legal guardian, niece, Nurse, children's Disposition Plan: Status is: Inpatient  Remains inpatient appropriate because: Continued fevers and need for IV antibiotic therapy.         Consultants:    Procedures:    Antimicrobials:  Ceftriaxone 12/20 > 12/22 Meropenem 12/22 >   Subjective: He says he is feeling better.  Denies any shortness of breath.  Objective: Vitals:   12/09/21 0543 12/09/21 0747 12/09/21 1402 12/09/21 1955  BP: (!) 151/67  (!) 97/57   Pulse: 73  80   Resp: 18  18   Temp: 98.6 F (37 C)  97.6 F (36.4 C)   TempSrc: Oral     SpO2: 99% 99% 97% 96%  Weight:      Height:        Intake/Output Summary (Last 24 hours) at 12/09/2021 1959 Last data filed at 12/09/2021 1800  Gross per 24 hour  Intake 2040 ml  Output 2302 ml  Net -262 ml   Filed Weights   12/06/21 0139  Weight: 64.8 kg    Examination:  General exam: Appears calm and comfortable  Respiratory system: Diminished breath sounds bilaterally Cardiovascular system: S1 &  S2 heard, RRR. No JVD, murmurs, rubs, gallops or clicks. No pedal edema. Gastrointestinal system: Abdomen is nondistended, soft and nontender. No organomegaly or masses felt. Normal bowel sounds heard. Central nervous system:  No focal neurological deficits. Extremities: Symmetric 5 x 5 power. Skin: No rashes, lesions or ulcers Psychiatry: Easily agitated    Data Reviewed: I have personally reviewed following labs and imaging studies  CBC: Recent Labs  Lab 12/05/21 2036 12/06/21 0453 12/07/21 0444 12/08/21 0512  WBC 17.2* 9.6 8.1 8.3  HGB 14.5 12.1* 10.9* 12.1*  HCT 43.9 36.6* 33.7* 37.6*  MCV 85.9 86.7 86.0 86.4  PLT 275 200 194 814   Basic Metabolic Panel: Recent Labs  Lab 12/05/21 2036 12/06/21 0453 12/07/21 0444 12/08/21 0512  NA 132* 134* 132* 132*  K 4.0 3.8 3.5 4.1  CL 96* 102 101 102  CO2 26 23 23  21*  GLUCOSE 194* 130* 199* 114*  BUN 24* 21 27* 18  CREATININE 1.31* 1.00 1.08 1.06  CALCIUM 8.6* 7.9* 8.0* 8.0*  MG  --  1.9  --   --   PHOS  --  3.7  --   --    GFR: Estimated Creatinine Clearance: 58.6 mL/min (by C-G formula based on SCr of 1.06 mg/dL). Liver Function Tests: Recent Labs  Lab 12/05/21 2036 12/06/21 0453 12/07/21 0444  AST 27 17 20   ALT 44 32 27  ALKPHOS 80 59 53  BILITOT 0.8 0.6 0.4  PROT 7.8 6.0* 5.7*  ALBUMIN 2.8* 2.2* 2.0*   No results for input(s): LIPASE, AMYLASE in the last 168 hours. No results for input(s): AMMONIA in the last 168 hours. Coagulation Profile: No results for input(s): INR, PROTIME in the last 168 hours. Cardiac Enzymes: No results for input(s): CKTOTAL, CKMB, CKMBINDEX, TROPONINI in the last 168 hours. BNP (last 3 results) No results for input(s): PROBNP in the last 8760 hours. HbA1C: No results for input(s): HGBA1C in the last 72 hours. CBG: No results for input(s): GLUCAP in the last 168 hours. Lipid Profile: No results for input(s): CHOL, HDL, LDLCALC, TRIG, CHOLHDL, LDLDIRECT in the last 72  hours. Thyroid Function Tests: No results for input(s): TSH, T4TOTAL, FREET4, T3FREE, THYROIDAB in the last 72 hours. Anemia Panel: No results for input(s): VITAMINB12, FOLATE, FERRITIN, TIBC, IRON, RETICCTPCT in the last 72 hours. Sepsis Labs: Recent Labs  Lab 12/05/21 2036 12/05/21 2256  LATICACIDVEN 2.4* 1.4    Recent Results (from the past 240 hour(s))  Urine Culture     Status: Abnormal   Collection Time: 12/05/21  8:21 PM   Specimen: In/Out Cath Urine  Result Value Ref Range Status   Specimen Description   Final    IN/OUT CATH URINE Performed at [facility], [address] [place], [place] [address]    Special Requests   Final    NONE Performed at [facility], [address], [place], [place] [address]    Culture (A)  Final    >=100,000 COLONIES/mL PSEUDOMONAS AERUGINOSA 10,000 COLONIES/mL ENTEROCOCCUS FAECIUM VANCOMYCIN RESISTANT ENTEROCOCCUS ISOLATED    Report Status 12/09/2021 FINAL  Final   Organism ID, Bacteria PSEUDOMONAS AERUGINOSA (A)  Final   Organism ID, Bacteria ENTEROCOCCUS FAECIUM (A)  Final      Susceptibility   Enterococcus faecium - MIC*    AMPICILLIN >=32 RESISTANT Resistant     NITROFURANTOIN 256 RESISTANT Resistant     VANCOMYCIN >=32 RESISTANT Resistant     LINEZOLID 2 SENSITIVE Sensitive     * 10,000 COLONIES/mL ENTEROCOCCUS FAECIUM   Pseudomonas aeruginosa - MIC*    CEFTAZIDIME 4 SENSITIVE Sensitive     CIPROFLOXACIN <=0.25 SENSITIVE Sensitive     GENTAMICIN <=1 SENSITIVE Sensitive     IMIPENEM 1 SENSITIVE Sensitive     PIP/TAZO 8 SENSITIVE Sensitive     CEFEPIME 2 SENSITIVE Sensitive     * >=100,000 COLONIES/mL PSEUDOMONAS AERUGINOSA  Blood culture (routine x 2)     Status: None (Preliminary result)   Collection Time: 12/05/21  8:36 PM   Specimen: BLOOD LEFT FOREARM  Result Value Ref Range Status   Specimen Description BLOOD LEFT FOREARM  Final   Special Requests   Final    BOTTLES DRAWN AEROBIC AND ANAEROBIC Blood Culture adequate  volume   Culture   Final    NO GROWTH 4 DAYS Performed at [facility], [address], [place], [place] [address]    Report Status PENDING  Incomplete  Blood culture (routine x 2)     Status: Abnormal   Collection Time: 12/05/21  8:36 PM   Specimen: Right Antecubital; Blood  Result Value Ref Range Status   Specimen Description   Final    RIGHT ANTECUBITAL Performed at [facility], [address] [place], [place] [address]    Special Requests   Final    BOTTLES DRAWN AEROBIC AND ANAEROBIC Blood Culture results may not be optimal due to an inadequate volume of blood received in culture bottles Performed at [facility], [address] [place], [place] [address]    Culture  Setup Time   Final    GRAM POSITIVE COCCI AEROBIC BOTTLE ONLY CRITICAL RESULT CALLED TO, READ BACK BY AND VERIFIED WITH: J JONES,RN@[id number]  12/07/21 [place]    Culture (A)  Final    STAPHYLOCOCCUS HOMINIS THE SIGNIFICANCE OF ISOLATING THIS ORGANISM FROM A SINGLE SET OF BLOOD CULTURES WHEN MULTIPLE SETS ARE DRAWN IS UNCERTAIN. PLEASE NOTIFY THE MICROBIOLOGY DEPARTMENT WITHIN ONE WEEK IF SPECIATION AND SENSITIVITIES ARE REQUIRED. Performed at [facility], [place] [address], [place], [place] [address]    Report Status 12/08/2021 FINAL  Final  Resp Panel by RT-PCR (Flu A&B, Covid) Nasopharyngeal Swab     Status: None   Collection Time: 12/05/21  8:36 PM   Specimen: Nasopharyngeal Swab; Nasopharyngeal(NP) swabs in vial transport medium  Result Value Ref Range Status   SARS Coronavirus 2 by RT PCR NEGATIVE NEGATIVE Final    Comment: (NOTE) SARS-CoV-2 target nucleic acids are NOT DETECTED.  The SARS-CoV-2 RNA is generally detectable in upper respiratory specimens during the acute phase of infection. The lowest concentration of SARS-CoV-2 viral copies this assay can detect is 138 copies/mL. A negative result does not preclude SARS-Cov-2 infection and should not be used as the sole basis for treatment or other patient  management decisions. A negative result may occur with  improper specimen collection/handling, submission of specimen other than nasopharyngeal swab, presence of viral mutation(s) within the areas targeted by this assay, and inadequate number of viral copies(<138 copies/mL). A negative result must be combined with clinical observations, patient history, and epidemiological information. The expected result is Negative.  Fact Sheet for Patients:  EntrepreneurPulse.com.au  Fact Sheet for Healthcare Providers:  IncredibleEmployment.be  This test is no t yet approved or cleared by the [place] and  has been authorized for detection and/or diagnosis of SARS-CoV-2 by FDA under an Emergency Use Authorization (EUA). This EUA will remain  in effect (meaning this test can be used) for the duration of the COVID-19 declaration under Section 564(b)(1) of the Act, 21 U.S.C.section 360bbb-3(b)(1), unless the authorization is terminated  or revoked sooner.       Influenza A by PCR NEGATIVE NEGATIVE Final   Influenza B by PCR NEGATIVE NEGATIVE Final    Comment: (NOTE) The Xpert Xpress SARS-CoV-2/FLU/RSV plus assay is intended as an aid in the diagnosis of influenza from Nasopharyngeal swab specimens and should not be used as a sole basis for treatment. Nasal washings and aspirates are unacceptable for Xpert Xpress SARS-CoV-2/FLU/RSV testing.  Fact Sheet for Patients: EntrepreneurPulse.com.au  Fact Sheet for Healthcare Providers: IncredibleEmployment.be  This test is not yet approved or cleared by the [place] FDA and has been authorized for detection and/or diagnosis of SARS-CoV-2 by FDA under an Emergency Use Authorization (EUA). This EUA will remain in effect (meaning this test can be used) for the duration of the COVID-19 declaration under Section 564(b)(1) of the Act, 21 U.S.C. section 360bbb-3(b)(1),  unless the authorization is terminated or revoked.  Performed at [facility], [address], [place], [place] [address]   Blood Culture ID Panel (Reflexed)     Status: Abnormal   Collection Time: 12/05/21  8:36 PM  Result Value Ref Range Status   Enterococcus faecalis NOT DETECTED NOT DETECTED Final   Enterococcus Faecium NOT DETECTED NOT DETECTED Final   Listeria monocytogenes NOT DETECTED NOT DETECTED Final   Staphylococcus species DETECTED (A) NOT DETECTED Final    Comment: CRITICAL RESULT CALLED TO, READ BACK BY AND VERIFIED WITH: J JONES,RN@[id number]  12/07/21 [place]    Staphylococcus aureus (BCID) NOT DETECTED NOT DETECTED Final   Staphylococcus epidermidis NOT DETECTED NOT DETECTED Final   Staphylococcus lugdunensis NOT DETECTED NOT DETECTED Final   Streptococcus species NOT DETECTED NOT DETECTED Final   Streptococcus agalactiae NOT DETECTED NOT DETECTED Final   Streptococcus pneumoniae NOT DETECTED NOT DETECTED Final   Streptococcus pyogenes NOT DETECTED NOT DETECTED Final   A.calcoaceticus-baumannii NOT DETECTED NOT DETECTED Final   Bacteroides fragilis NOT DETECTED NOT DETECTED Final   Enterobacterales NOT DETECTED NOT DETECTED Final   Enterobacter cloacae complex NOT DETECTED NOT DETECTED Final   Escherichia coli NOT DETECTED NOT DETECTED Final   Klebsiella aerogenes NOT DETECTED NOT DETECTED Final   Klebsiella oxytoca NOT DETECTED NOT DETECTED Final   Klebsiella pneumoniae NOT DETECTED NOT DETECTED Final   Proteus species NOT DETECTED NOT DETECTED Final   Salmonella species NOT DETECTED NOT DETECTED Final   Serratia marcescens NOT DETECTED NOT DETECTED Final   Haemophilus influenzae NOT DETECTED NOT DETECTED Final   Neisseria meningitidis NOT DETECTED NOT DETECTED Final   Pseudomonas aeruginosa NOT DETECTED NOT DETECTED Final   Stenotrophomonas maltophilia NOT DETECTED NOT DETECTED Final   Candida albicans NOT DETECTED NOT DETECTED Final   Candida auris NOT DETECTED NOT  DETECTED Final   Candida glabrata NOT DETECTED NOT DETECTED Final   Candida krusei NOT DETECTED NOT DETECTED Final   Candida parapsilosis NOT DETECTED NOT DETECTED Final   Candida tropicalis NOT DETECTED NOT DETECTED Final   Cryptococcus neoformans/gattii NOT DETECTED NOT DETECTED Final    Comment: Performed at [facility], [place]. [address], [place], [place] [address]  Culture, blood (routine x  2)     Status: None (Preliminary result)   Collection Time: 12/07/21 10:45 AM   Specimen: BLOOD  Result Value Ref Range Status   Specimen Description BLOOD RIGHT ANTECUBITAL  Final   Special Requests   Final    BOTTLES DRAWN AEROBIC AND ANAEROBIC Blood Culture adequate volume   Culture   Final    NO GROWTH 2 DAYS Performed at [facility], [address] [place], [place] [address]    Report Status PENDING  Incomplete  Culture, blood (routine x 2)     Status: None (Preliminary result)   Collection Time: 12/07/21 10:50 AM   Specimen: BLOOD  Result Value Ref Range Status   Specimen Description BLOOD BLOOD LEFT HAND  Final   Special Requests   Final    BOTTLES DRAWN AEROBIC AND ANAEROBIC Blood Culture adequate volume   Culture   Final    NO GROWTH 2 DAYS Performed at [facility], [address], [place], [place] [address]    Report Status PENDING  Incomplete         Radiology Studies: No results found.      Scheduled Meds:  budesonide (PULMICORT) nebulizer solution  0.25 mg Nebulization BID   clopidogrel  75 mg Oral Daily   enoxaparin (LOVENOX) injection  40 mg Subcutaneous Q24H   feeding supplement  237 mL Oral TID BM   ipratropium-albuterol  3 mL Nebulization BID   melatonin  6 mg Oral QHS   pantoprazole  40 mg Oral Daily   rosuvastatin  20 mg Oral Daily   Continuous Infusions:  meropenem (MERREM) IV 1 g (12/09/21 1229)     LOS: 4 days    Time spent: 35 minutes    Kathie Dike, MD [facility]   If 7PM-7AM, please contact  night-coverage [email or web address]  12/09/2021, 7:59 PM

## 2021-12-10 DIAGNOSIS — R4182 Altered mental status, unspecified: Secondary | ICD-10-CM | POA: Diagnosis not present

## 2021-12-10 DIAGNOSIS — J449 Chronic obstructive pulmonary disease, unspecified: Secondary | ICD-10-CM | POA: Diagnosis not present

## 2021-12-10 DIAGNOSIS — N390 Urinary tract infection, site not specified: Secondary | ICD-10-CM | POA: Diagnosis not present

## 2021-12-10 DIAGNOSIS — I739 Peripheral vascular disease, unspecified: Secondary | ICD-10-CM | POA: Diagnosis not present

## 2021-12-10 MED ORDER — CIPROFLOXACIN HCL 250 MG PO TABS
500.0000 mg | ORAL_TABLET | Freq: Two times a day (BID) | ORAL | Status: DC
  Administered 2021-12-10 – 2021-12-12 (×5): 500 mg via ORAL
  Filled 2021-12-10 (×5): qty 2

## 2021-12-10 NOTE — Progress Notes (Signed)
Pharmacy Antibiotic Note  Raymond Walker. is a 70 y.o. male admitted on 12/05/2021 with UTI.  Pharmacy has been consulted for Merrem dosing. Concern for pyelonephritis and elevated temps  Plan: Merrem 1gm IV q8h F/U cxs and clinical progress Monitor V/S, labs  Height: 6' (182.9 cm) Weight: 64.8 kg (142 lb 13.7 oz) IBW/kg (Calculated) : 77.6  Temp (24hrs), Avg:98.3 F (36.8 C), Min:97.6 F (36.4 C), Max:99.2 F (37.3 C)  Recent Labs  Lab 12/05/21 2036 12/05/21 2256 12/06/21 0453 12/07/21 0444 12/08/21 0512  WBC 17.2*  --  9.6 8.1 8.3  CREATININE 1.31*  --  1.00 1.08 1.06  LATICACIDVEN 2.4* 1.4  --   --   --      Estimated Creatinine Clearance: 58.6 mL/min (by C-G formula based on SCr of 1.06 mg/dL).    No Known Allergies  Antimicrobials this admission: Merrem 12/22 >>  Ceftriaxone 12/21 >> 12/22  Microbiology results: 12/22 BCx: NGTD 12/20 BCX: +GPC, staph species in 1 bottle, ? Contaminant. D/W MD 12/20 UCX: >100,000 pseudomonas and 10,000 enterococcus (Dr Roderic Palau not treating enterococcus right now) 12/7 MRSA PCR is neg  Thank you for allowing pharmacy to be a part of this patients care.  Thomasenia Sales, PharmD, [facility] Clinical Pharmacist  12/10/2021 9:15 AM

## 2021-12-10 NOTE — Progress Notes (Signed)
PROGRESS NOTE    [name]  OEH:[id number] DOB: 04-Nov-1951 DOA: 12/05/2021 PCP: [name], MD    Brief Narrative:  70 year old male with a history of COPD, PAD, prior history of kidney stones, who was recently in the hospital from 12 6-12 8 when he was noted to have obstructing right-sided renal stones and UTI.  He was seen by urology and underwent right ureter stent placement.  He was discharged with Keflex.  Unclear if he completed this antibiotic.  He returns to the hospital with altered mental status and fevers.  Noted to have recurrent infection and CT indicating right-sided pyelonephritis, but no evidence of obstructing stone/hydronephrosis.  Discussed with urology who recommended continued antibiotic therapy.  Follow-up cultures.   Assessment & Plan:   Active Problems:   COPD (chronic obstructive pulmonary disease) (HCC)   PAD (peripheral artery disease) (HCC)   Sepsis secondary to UTI (HCC)   Leukocytosis   Hypoalbuminemia due to protein-calorie malnutrition (HCC)   Lactic acidosis   AKI (acute kidney injury) ([place])   Essential hypertension   Mixed hyperlipidemia   GERD (gastroesophageal reflux disease)   Sepsis secondary to pyelonephritis -Admitted with fever, tachypnea, leukocytosis -Lactic acid elevated on admission -Urine culture appears to be positive for Pseudomonas -fevers now resolved -transition meropenem to ciprofloxacin -He was noted to have 1 out of 2 positive blood cultures for staph hominis.  Suspect this may be a contaminant -Urine culture also indicated 10,000 colonies of Enterococcus. This is unlikely to be true infection since he has improved without targeted treatment  Acute metabolic encephalopathy -Suspect is related to underlying sepsis -Continue to monitor -ABG shows normal PCO2 -mental status now back to baseline  COPD -no shortness of breath or wheezing -continue bronchodilators  Hypertension -Antihypertensives held on  admission due to soft blood pressures -currently normotensive -Continue to monitor  PAD -Continue on statin and Plavix  Hyperlipidemia -Continue statin  Infrarenal aortic aneurysm -will need surveillance in 3 years  Generalized weakness -Seen by physical therapy with recommendation for skilled nursing facility placement.  Although patient is refusing. -Discussed with his power of attorney, who agreed that it discharge to skilled nursing facility would be in patient's best interest -She plans on discussing this further with patient and feels as though patient will likely agree after speaking to her   DVT prophylaxis: enoxaparin (LOVENOX) injection 40 mg Start: 12/06/21 1000 SCDs Start: 12/06/21 0128  Code Status: DNR Family Communication: Discussed with patient.  Updated patient's legal guardian, niece, [name] [name] Disposition Plan: Status is: Inpatient  Remains inpatient appropriate because: awaiting placement    Consultants:    Procedures:    Antimicrobials:  Ceftriaxone 12/20 > 12/22 Meropenem 12/22 >   Subjective: Says he is sore in right groin area  Objective: Vitals:   12/09/21 1955 12/09/21 2115 12/10/21 0457 12/10/21 1412  BP:  (!) 113/93 130/74 105/66  Pulse:  94 76 69  Resp:  (!) 22 (!) 22 19  Temp:  99.2 F (37.3 C) 98.2 F (36.8 C) 97.8 F (36.6 C)  TempSrc:    Oral  SpO2: 96% 97% 95% 96%  Weight:      Height:        Intake/Output Summary (Last 24 hours) at 12/10/2021 1643 Last data filed at 12/10/2021 1552 Gross per 24 hour  Intake 1360 ml  Output 2100 ml  Net -740 ml   Filed Weights   12/06/21 0139  Weight: 64.8 kg    Examination:  General  exam: Alert, awake, oriented x 3 Respiratory system: Clear to auscultation. Respiratory effort normal. Cardiovascular system:RRR. No murmurs, rubs, gallops. Gastrointestinal system: Abdomen is nondistended, soft and nontender. No organomegaly or masses felt. Normal bowel sounds heard. No  obvious hernias. Mild tenderness in RLQ  Central nervous system: Alert and oriented. No focal neurological deficits. Extremities: No C/C/E, +pedal pulses Skin: No rashes, lesions or ulcers Psychiatry: Judgement and insight appear normal. Mood & affect appropriate.      Data Reviewed: I have personally reviewed following labs and imaging studies  CBC: Recent Labs  Lab 12/05/21 2036 12/06/21 0453 12/07/21 0444 12/08/21 0512  WBC 17.2* 9.6 8.1 8.3  HGB 14.5 12.1* 10.9* 12.1*  HCT 43.9 36.6* 33.7* 37.6*  MCV 85.9 86.7 86.0 86.4  PLT 275 200 194 706   Basic Metabolic Panel: Recent Labs  Lab 12/05/21 2036 12/06/21 0453 12/07/21 0444 12/08/21 0512  NA 132* 134* 132* 132*  K 4.0 3.8 3.5 4.1  CL 96* 102 101 102  CO2 26 23 23  21*  GLUCOSE 194* 130* 199* 114*  BUN 24* 21 27* 18  CREATININE 1.31* 1.00 1.08 1.06  CALCIUM 8.6* 7.9* 8.0* 8.0*  MG  --  1.9  --   --   PHOS  --  3.7  --   --    GFR: Estimated Creatinine Clearance: 58.6 mL/min (by C-G formula based on SCr of 1.06 mg/dL). Liver Function Tests: Recent Labs  Lab 12/05/21 2036 12/06/21 0453 12/07/21 0444  AST 27 17 20   ALT 44 32 27  ALKPHOS 80 59 53  BILITOT 0.8 0.6 0.4  PROT 7.8 6.0* 5.7*  ALBUMIN 2.8* 2.2* 2.0*   No results for input(s): LIPASE, AMYLASE in the last 168 hours. No results for input(s): AMMONIA in the last 168 hours. Coagulation Profile: No results for input(s): INR, PROTIME in the last 168 hours. Cardiac Enzymes: No results for input(s): CKTOTAL, CKMB, CKMBINDEX, TROPONINI in the last 168 hours. BNP (last 3 results) No results for input(s): PROBNP in the last 8760 hours. HbA1C: No results for input(s): HGBA1C in the last 72 hours. CBG: No results for input(s): GLUCAP in the last 168 hours. Lipid Profile: No results for input(s): CHOL, HDL, LDLCALC, TRIG, CHOLHDL, LDLDIRECT in the last 72 hours. Thyroid Function Tests: No results for input(s): TSH, T4TOTAL, FREET4, T3FREE, THYROIDAB in  the last 72 hours. Anemia Panel: No results for input(s): VITAMINB12, FOLATE, FERRITIN, TIBC, IRON, RETICCTPCT in the last 72 hours. Sepsis Labs: Recent Labs  Lab 12/05/21 2036 12/05/21 2256  LATICACIDVEN 2.4* 1.4    Recent Results (from the past 240 hour(s))  Urine Culture     Status: Abnormal   Collection Time: 12/05/21  8:21 PM   Specimen: In/Out Cath Urine  Result Value Ref Range Status   Specimen Description   Final    IN/OUT CATH URINE Performed at [facility], [address], [place], [place] [address]    Special Requests   Final    NONE Performed at [facility], [address], [place], [place] [address]    Culture (A)  Final    >=100,000 COLONIES/mL PSEUDOMONAS AERUGINOSA 10,000 COLONIES/mL ENTEROCOCCUS FAECIUM VANCOMYCIN RESISTANT ENTEROCOCCUS ISOLATED    Report Status 12/09/2021 FINAL  Final   Organism ID, Bacteria PSEUDOMONAS AERUGINOSA (A)  Final   Organism ID, Bacteria ENTEROCOCCUS FAECIUM (A)  Final      Susceptibility   Enterococcus faecium - MIC*    AMPICILLIN >=32 RESISTANT Resistant     NITROFURANTOIN  256 RESISTANT Resistant     VANCOMYCIN >=32 RESISTANT Resistant     LINEZOLID 2 SENSITIVE Sensitive     * 10,000 COLONIES/mL ENTEROCOCCUS FAECIUM   Pseudomonas aeruginosa - MIC*    CEFTAZIDIME 4 SENSITIVE Sensitive     CIPROFLOXACIN <=0.25 SENSITIVE Sensitive     GENTAMICIN <=1 SENSITIVE Sensitive     IMIPENEM 1 SENSITIVE Sensitive     PIP/TAZO 8 SENSITIVE Sensitive     CEFEPIME 2 SENSITIVE Sensitive     * >=100,000 COLONIES/mL PSEUDOMONAS AERUGINOSA  Blood culture (routine x 2)     Status: None (Preliminary result)   Collection Time: 12/05/21  8:36 PM   Specimen: BLOOD LEFT FOREARM  Result Value Ref Range Status   Specimen Description BLOOD LEFT FOREARM  Final   Special Requests   Final    BOTTLES DRAWN AEROBIC AND ANAEROBIC Blood Culture adequate volume   Culture   Final    NO GROWTH 4 DAYS Performed at [facility], [address]  [place], [place] [address]    Report Status PENDING  Incomplete  Blood culture (routine x 2)     Status: Abnormal   Collection Time: 12/05/21  8:36 PM   Specimen: Right Antecubital; Blood  Result Value Ref Range Status   Specimen Description   Final    RIGHT ANTECUBITAL Performed at [facility], [address], [place], [place] [address]    Special Requests   Final    BOTTLES DRAWN AEROBIC AND ANAEROBIC Blood Culture results may not be optimal due to an inadequate volume of blood received in culture bottles Performed at [facility], [address] [place], [place] [address]    Culture  Setup Time   Final    GRAM POSITIVE COCCI AEROBIC BOTTLE ONLY CRITICAL RESULT CALLED TO, READ BACK BY AND VERIFIED WITH: J JONES,RN@[id number]  12/07/21 [place]    Culture (A)  Final    STAPHYLOCOCCUS HOMINIS THE SIGNIFICANCE OF ISOLATING THIS ORGANISM FROM A SINGLE SET OF BLOOD CULTURES WHEN MULTIPLE SETS ARE DRAWN IS UNCERTAIN. PLEASE NOTIFY THE MICROBIOLOGY DEPARTMENT WITHIN ONE WEEK IF SPECIATION AND SENSITIVITIES ARE REQUIRED. Performed at [facility], [place] [address], [place], [place] [address]    Report Status 12/08/2021 FINAL  Final  Resp Panel by RT-PCR (Flu A&B, Covid) Nasopharyngeal Swab     Status: None   Collection Time: 12/05/21  8:36 PM   Specimen: Nasopharyngeal Swab; Nasopharyngeal(NP) swabs in vial transport medium  Result Value Ref Range Status   SARS Coronavirus 2 by RT PCR NEGATIVE NEGATIVE Final    Comment: (NOTE) SARS-CoV-2 target nucleic acids are NOT DETECTED.  The SARS-CoV-2 RNA is generally detectable in upper respiratory specimens during the acute phase of infection. The lowest concentration of SARS-CoV-2 viral copies this assay can detect is 138 copies/mL. A negative result does not preclude SARS-Cov-2 infection and should not be used as the sole basis for treatment or other patient management decisions. A negative result may occur with  improper specimen collection/handling,  submission of specimen other than nasopharyngeal swab, presence of viral mutation(s) within the areas targeted by this assay, and inadequate number of viral copies(<138 copies/mL). A negative result must be combined with clinical observations, patient history, and epidemiological information. The expected result is Negative.  Fact Sheet for Patients:  EntrepreneurPulse.com.au  Fact Sheet for Healthcare Providers:  IncredibleEmployment.be  This test is no t yet approved or cleared by the [place] FDA and  has been authorized for detection and/or diagnosis of  SARS-CoV-2 by FDA under an Emergency Use Authorization (EUA). This EUA will remain  in effect (meaning this test can be used) for the duration of the COVID-19 declaration under Section 564(b)(1) of the Act, 21 U.S.C.section 360bbb-3(b)(1), unless the authorization is terminated  or revoked sooner.       Influenza A by PCR NEGATIVE NEGATIVE Final   Influenza B by PCR NEGATIVE NEGATIVE Final    Comment: (NOTE) The Xpert Xpress SARS-CoV-2/FLU/RSV plus assay is intended as an aid in the diagnosis of influenza from Nasopharyngeal swab specimens and should not be used as a sole basis for treatment. Nasal washings and aspirates are unacceptable for Xpert Xpress SARS-CoV-2/FLU/RSV testing.  Fact Sheet for Patients: EntrepreneurPulse.com.au  Fact Sheet for Healthcare Providers: IncredibleEmployment.be  This test is not yet approved or cleared by the [place] FDA and has been authorized for detection and/or diagnosis of SARS-CoV-2 by FDA under an Emergency Use Authorization (EUA). This EUA will remain in effect (meaning this test can be used) for the duration of the COVID-19 declaration under Section 564(b)(1) of the Act, 21 U.S.C. section 360bbb-3(b)(1), unless the authorization is terminated or revoked.  Performed at [facility], [address] [place], [place] [address]   Blood Culture ID Panel (Reflexed)     Status: Abnormal   Collection Time: 12/05/21  8:36 PM  Result Value Ref Range Status   Enterococcus faecalis NOT DETECTED NOT DETECTED Final   Enterococcus Faecium NOT DETECTED NOT DETECTED Final   Listeria monocytogenes NOT DETECTED NOT DETECTED Final   Staphylococcus species DETECTED (A) NOT DETECTED Final    Comment: CRITICAL RESULT CALLED TO, READ BACK BY AND VERIFIED WITH: J JONES,RN@0338  12/07/21 [place]    Staphylococcus aureus (BCID) NOT DETECTED NOT DETECTED Final   Staphylococcus epidermidis NOT DETECTED NOT DETECTED Final   Staphylococcus lugdunensis NOT DETECTED NOT DETECTED Final   Streptococcus species NOT DETECTED NOT DETECTED Final   Streptococcus agalactiae NOT DETECTED NOT DETECTED Final   Streptococcus pneumoniae NOT DETECTED NOT DETECTED Final   Streptococcus pyogenes NOT DETECTED NOT DETECTED Final   A.calcoaceticus-baumannii NOT DETECTED NOT DETECTED Final   Bacteroides fragilis NOT DETECTED NOT DETECTED Final   Enterobacterales NOT DETECTED NOT DETECTED Final   Enterobacter cloacae complex NOT DETECTED NOT DETECTED Final   Escherichia coli NOT DETECTED NOT DETECTED Final   Klebsiella aerogenes NOT DETECTED NOT DETECTED Final   Klebsiella oxytoca NOT DETECTED NOT DETECTED Final   Klebsiella pneumoniae NOT DETECTED NOT DETECTED Final   Proteus species NOT DETECTED NOT DETECTED Final   Salmonella species NOT DETECTED NOT DETECTED Final   Serratia marcescens NOT DETECTED NOT DETECTED Final   Haemophilus influenzae NOT DETECTED NOT DETECTED Final   Neisseria meningitidis NOT DETECTED NOT DETECTED Final   Pseudomonas aeruginosa NOT DETECTED NOT DETECTED Final   Stenotrophomonas maltophilia NOT DETECTED NOT DETECTED Final   Candida albicans NOT DETECTED NOT DETECTED Final   Candida auris NOT DETECTED NOT DETECTED Final   Candida glabrata NOT DETECTED NOT DETECTED Final   Candida krusei NOT DETECTED NOT  DETECTED Final   Candida parapsilosis NOT DETECTED NOT DETECTED Final   Candida tropicalis NOT DETECTED NOT DETECTED Final   Cryptococcus neoformans/gattii NOT DETECTED NOT DETECTED Final    Comment: Performed at [facility], [address] [place], [place] [address]  Culture, blood (routine x 2)     Status: None (Preliminary result)   Collection Time: 12/07/21 10:45 AM   Specimen: BLOOD  Result Value Ref  Range Status   Specimen Description BLOOD RIGHT ANTECUBITAL  Final   Special Requests   Final    BOTTLES DRAWN AEROBIC AND ANAEROBIC Blood Culture adequate volume   Culture   Final    NO GROWTH 2 DAYS Performed at [facility], [address] [place], [place] [address]    Report Status PENDING  Incomplete  Culture, blood (routine x 2)     Status: None (Preliminary result)   Collection Time: 12/07/21 10:50 AM   Specimen: BLOOD  Result Value Ref Range Status   Specimen Description BLOOD BLOOD LEFT HAND  Final   Special Requests   Final    BOTTLES DRAWN AEROBIC AND ANAEROBIC Blood Culture adequate volume   Culture   Final    NO GROWTH 2 DAYS Performed at [facility], [address], [place], [place] [address]    Report Status PENDING  Incomplete         Radiology Studies: No results found.      Scheduled Meds:  budesonide (PULMICORT) nebulizer solution  0.25 mg Nebulization BID   ciprofloxacin  500 mg Oral BID   clopidogrel  75 mg Oral Daily   enoxaparin (LOVENOX) injection  40 mg Subcutaneous Q24H   feeding supplement  237 mL Oral TID BM   ipratropium-albuterol  3 mL Nebulization BID   melatonin  6 mg Oral QHS   pantoprazole  40 mg Oral Daily   rosuvastatin  20 mg Oral Daily   Continuous Infusions:     LOS: 5 days    Time spent: 35 minutes    Kathie Dike, MD [facility]   If 7PM-7AM, please contact night-coverage [email or web address]  12/10/2021, 4:43 PM

## 2021-12-11 DIAGNOSIS — J449 Chronic obstructive pulmonary disease, unspecified: Secondary | ICD-10-CM | POA: Diagnosis not present

## 2021-12-11 DIAGNOSIS — R4182 Altered mental status, unspecified: Secondary | ICD-10-CM | POA: Diagnosis not present

## 2021-12-11 DIAGNOSIS — N390 Urinary tract infection, site not specified: Secondary | ICD-10-CM | POA: Diagnosis not present

## 2021-12-11 DIAGNOSIS — I739 Peripheral vascular disease, unspecified: Secondary | ICD-10-CM | POA: Diagnosis not present

## 2021-12-11 LAB — CULTURE, BLOOD (ROUTINE X 2)
Culture: NO GROWTH
Special Requests: ADEQUATE

## 2021-12-11 LAB — CBC
HCT: 36.8 % — ABNORMAL LOW (ref 39.0–52.0)
Hemoglobin: 12.1 g/dL — ABNORMAL LOW (ref 13.0–17.0)
MCH: 27.8 pg (ref 26.0–34.0)
MCHC: 32.9 g/dL (ref 30.0–36.0)
MCV: 84.6 fL (ref 80.0–100.0)
Platelets: 189 10*3/uL (ref 150–400)
RBC: 4.35 MIL/uL (ref 4.22–5.81)
RDW: 14.3 % (ref 11.5–15.5)
WBC: 6.6 10*3/uL (ref 4.0–10.5)
nRBC: 0 % (ref 0.0–0.2)

## 2021-12-11 LAB — BASIC METABOLIC PANEL
Anion gap: 11 (ref 5–15)
BUN: 22 mg/dL (ref 8–23)
CO2: 22 mmol/L (ref 22–32)
Calcium: 8.4 mg/dL — ABNORMAL LOW (ref 8.9–10.3)
Chloride: 97 mmol/L — ABNORMAL LOW (ref 98–111)
Creatinine, Ser: 0.89 mg/dL (ref 0.61–1.24)
GFR, Estimated: >60 mL/min (ref >60)
Glucose, Bld: 107 mg/dL — ABNORMAL HIGH (ref 70–99)
Potassium: 3.5 mmol/L (ref 3.5–5.1)
Sodium: 130 mmol/L — ABNORMAL LOW (ref 135–145)

## 2021-12-11 MED ORDER — OXYCODONE HCL 5 MG PO TABS
5.0000 mg | ORAL_TABLET | Freq: Once | ORAL | Status: AC
  Administered 2021-12-11: 21:00:00 5 mg via ORAL
  Filled 2021-12-11: qty 1

## 2021-12-11 NOTE — Care Management Important Message (Signed)
Important Message  Patient Details  Name: Raymond Walker. MRN: [id number] Date of Birth: 10/07/1951   Medicare Important Message Given:  Yes  Reviewed Medicare IM with [name], Niece/POA, at [phone].  Declined receiving copy at this time (should already have one in room).    Dannette Barbara 12/11/2021, 4:31 PM

## 2021-12-11 NOTE — Progress Notes (Signed)
Physical Therapy Treatment Patient Details Name: Raymond Walker. MRN: [id number] DOB: 18-Feb-1951 Today's Date: 12/11/2021   History of Present Illness Raymond Walker. is a 70 y.o. male with medical history significant for COPD, PAD, prior history of kidney stones who presents to the emergency department via EMS due to altered mental status.  Patient was unable to provide history, history was obtained from ED physician and ED medical record.  Per report, patient was recently admitted from 12/6-12/8 due to sepsis secondary to UTI with CT showing numerous obstructing kidney stones on the right status post cystoscopy and placement of double-J stent in the right ureter.  5 days of oral Keflex and follow-up as an outpatient for elective management of multiple kidney stones was advised.  It appeared that patient was not compliant with post admission treatment/plan.  A friend went to check on him today and he was in bed confused, incontinent and weak, he complained of right flank pain.  EMS was activated and patient was taken to the ED for further evaluation and management.  Patient lives at home alone and able to perform IADLs at baseline.    PT Comments    Progressing with OOB mobility on this date with improved bed mobility requiring CGA for steadying support with position change but no LOB with sitting EOB.  Proceeded with transfer training requiring mod A for sit to stand and min-mod A for steadying support during transfers.  Ambulation performed with RW and CGA-min A level surface x 11 ft requiring some tactile cues for steadying AD and trunk support.  Demonstrates exertion/fatigue with mobility requiring frequent therapeutic rest periods.  Continued PT services indicated to progress strength and functional mobility to restore abilities to PLOF.  Continued physical rehabilitation recommended to improve status and decrease assistance of caregivers.    Recommendations for follow up therapy are one  component of a multi-disciplinary discharge planning process, led by the attending physician.  Recommendations may be updated based on patient status, additional functional criteria and insurance authorization.  Follow Up Recommendations  Skilled nursing-short term rehab (<3 hours/day)     Assistance Recommended at Discharge Intermittent Supervision/Assistance  Equipment Recommendations  None recommended by PT    Recommendations for Other Services       Precautions / Restrictions       Mobility  Bed Mobility Overal bed mobility: Needs Assistance Bed Mobility: Supine to Sit;Sit to Supine     Supine to sit: Min guard Sit to supine: Min guard     Patient Response: Cooperative  Transfers Overall transfer level: Needs assistance Equipment used: Rolling walker (2 wheels) Transfers: Sit to/from Stand;Bed to chair/wheelchair/BSC Sit to Stand: Mod assist Stand pivot transfers: Mod assist         General transfer comment: use of RW    Ambulation/Gait Ambulation/Gait assistance: Min assist Gait Distance (Feet): 11 Feet Assistive device: Rolling walker (2 wheels) Gait Pattern/deviations: Step-to pattern       General Gait Details: 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   Stairs             Wheelchair Mobility    Modified Rankin (Stroke Patients Only)       Balance                                            Cognition  Exercises      General Comments        Pertinent Vitals/Pain Pain Assessment: No/denies pain    Home Living                          Prior Function            PT Goals (current goals can now be found in the care plan section) Acute Rehab PT Goals Patient Stated Goal: return home PT Goal Formulation: With patient Time For Goal Achievement: 12/21/21 Potential to Achieve Goals: Good Progress towards PT goals:  Progressing toward goals    Frequency    Min 3X/week      PT Plan Current plan remains appropriate    Co-evaluation              AM-PAC PT "6 Clicks" Mobility   Outcome Measure  Help needed turning from your back to your side while in a flat bed without using bedrails?: A Little Help needed moving from lying on your back to sitting on the side of a flat bed without using bedrails?: A Little Help needed moving to and from a bed to a chair (including a wheelchair)?: A Little Help needed standing up from a chair using your arms (e.g., wheelchair or bedside chair)?: A Little Help needed to walk in hospital room?: A Lot Help needed climbing 3-5 steps with a railing? : A Lot 6 Click Score: 16    End of Session   Activity Tolerance: Patient tolerated treatment well;Patient limited by fatigue Patient left: in chair;with call bell/phone within reach Nurse Communication: Mobility status PT Visit Diagnosis: Unsteadiness on feet (R26.81);Other abnormalities of gait and mobility (R26.89);Muscle weakness (generalized) (M62.81)     Time: 8588-5027 PT Time Calculation (min) (ACUTE ONLY): 20 min  Charges:  $Therapeutic Activity: 8-22 mins                     3:11 PM, 12/11/21 M. Sherlyn Lees, PT, DPT Physical Therapist- [facility] [facility] [employer] Office Number: [phone]

## 2021-12-11 NOTE — TOC Progression Note (Signed)
Transition of Care (TOC) - Progression Note    Patient Details  Name: Raymond Walker. MRN: [id number] Date of Birth: 11/20/1951  Transition of [facility]) CM/SW Contact  Ihor Gully, LCSW Phone Number: 12/11/2021, 11:10 AM  Clinical Narrative:    Patient is agreeable to SNF. Wants to stay in [facility], with [place] facilities being his first choice. Spoke with POA [name] who is also agreeable to SNF. Referral sent to facilities of choice.   Expected Discharge Plan: [place] Barriers to Discharge: Continued Medical Work up  Expected Discharge Plan and Services Expected Discharge Plan: [place] arrangements for the past 2 months: Single Family Home                                       Social Determinants of Health (SDOH) Interventions    Readmission Risk Interventions No flowsheet data found.

## 2021-12-11 NOTE — Progress Notes (Signed)
PROGRESS NOTE    Raymond Walker.  AST:[id number] DOB: [birth year] DOA: 12/05/2021 PCP: [name], MD    Brief Narrative:  70 year old male with a history of COPD, PAD, prior history of kidney stones, who was recently in the hospital from 12 6-12 8 when he was noted to have obstructing right-sided renal stones and UTI.  He was seen by urology and underwent right ureter stent placement.  He was discharged with Keflex.  Unclear if he completed this antibiotic.  He returns to the hospital with altered mental status and fevers.  Noted to have recurrent infection and CT indicating right-sided pyelonephritis, but no evidence of obstructing stone/hydronephrosis.  Discussed with urology who recommended continued antibiotic therapy.  Follow-up cultures.   Assessment & Plan:   Active Problems:   COPD (chronic obstructive pulmonary disease) (HCC)   PAD (peripheral artery disease) (HCC)   Sepsis secondary to UTI (HCC)   Leukocytosis   Hypoalbuminemia due to protein-calorie malnutrition (HCC)   Lactic acidosis   AKI (acute kidney injury) ([place])   Essential hypertension   Mixed hyperlipidemia   GERD (gastroesophageal reflux disease)   Sepsis secondary to pyelonephritis -Admitted with fever, tachypnea, leukocytosis -Lactic acid elevated on admission -Urine culture appears to be positive for Pseudomonas -fevers now resolved -transition meropenem to ciprofloxacin -He was noted to have 1 out of 2 positive blood cultures for staph hominis.  Suspect this may be a contaminant -Urine culture also indicated 10,000 colonies of Enterococcus. This is unlikely to be true infection since he has improved without targeted treatment  Acute metabolic encephalopathy -Suspect is related to underlying sepsis -Continue to monitor -ABG shows normal PCO2 -mental status now back to baseline  COPD -no shortness of breath or wheezing -continue bronchodilators  Hypertension -Antihypertensives held on  admission due to soft blood pressures -currently normotensive -Continue to monitor  PAD -Continue on statin and Plavix  Hyperlipidemia -Continue statin  Infrarenal aortic aneurysm -will need surveillance in 3 years  Generalized weakness -Seen by physical therapy with recommendation for skilled nursing facility placement.  Although patient is refusing. -Discussed with his power of attorney, who agreed that it discharge to skilled nursing facility would be in patient's best interest -She plans on discussing this further with patient and feels as though patient will likely agree after speaking to her   DVT prophylaxis: enoxaparin (LOVENOX) injection 40 mg Start: 12/06/21 1000 SCDs Start: 12/06/21 0128  Code Status: DNR Family Communication: Discussed with patient.  Updated patient's legal guardian, niece, [name] Disposition Plan: Status is: Inpatient  Remains inpatient appropriate because: awaiting placement    Consultants:    Procedures:    Antimicrobials:  Ceftriaxone 12/20 > 12/22 Meropenem 12/22 > 12/25 Ciprofloxacin 12/25>   Subjective: Does not have any complaints today.  Now agreeable to go to skilled nursing facility  Objective: Vitals:   12/10/21 1412 12/10/21 2207 12/11/21 0549 12/11/21 1509  BP: 105/66 137/63 126/74 124/61  Pulse: 69 92 70 72  Resp: 19 20 18 18   Temp: 97.8 F (36.6 C) 98.4 F (36.9 C) 97.6 F (36.4 C) 97.8 F (36.6 C)  TempSrc: Oral   Oral  SpO2: 96% 95% 97% 98%  Weight:      Height:        Intake/Output Summary (Last 24 hours) at 12/11/2021 2112 Last data filed at 12/11/2021 1200 Gross per 24 hour  Intake 1440 ml  Output 600 ml  Net 840 ml   Filed Weights   12/06/21 0139  Weight: 64.8 kg    Examination:  General exam: Alert, awake, oriented x 3 Respiratory system: Clear to auscultation. Respiratory effort normal. Cardiovascular system:RRR. No murmurs, rubs, gallops. Gastrointestinal system: Abdomen is  nondistended, soft and nontender. No organomegaly or masses felt. Normal bowel sounds heard. No obvious hernias. Mild tenderness in RLQ  Central nervous system: Alert and oriented. No focal neurological deficits. Extremities: No C/C/E, +pedal pulses Skin: No rashes, lesions or ulcers Psychiatry: Judgement and insight appear normal. Mood & affect appropriate.      Data Reviewed: I have personally reviewed following labs and imaging studies  CBC: Recent Labs  Lab 12/05/21 2036 12/06/21 0453 12/07/21 0444 12/08/21 0512 12/11/21 0353  WBC 17.2* 9.6 8.1 8.3 6.6  HGB 14.5 12.1* 10.9* 12.1* 12.1*  HCT 43.9 36.6* 33.7* 37.6* 36.8*  MCV 85.9 86.7 86.0 86.4 84.6  PLT 275 200 194 186 364   Basic Metabolic Panel: Recent Labs  Lab 12/05/21 2036 12/06/21 0453 12/07/21 0444 12/08/21 0512 12/11/21 0353  NA 132* 134* 132* 132* 130*  K 4.0 3.8 3.5 4.1 3.5  CL 96* 102 101 102 97*  CO2 26 23 23  21* 22  GLUCOSE 194* 130* 199* 114* 107*  BUN 24* 21 27* 18 22  CREATININE 1.31* 1.00 1.08 1.06 0.89  CALCIUM 8.6* 7.9* 8.0* 8.0* 8.4*  MG  --  1.9  --   --   --   PHOS  --  3.7  --   --   --    GFR: Estimated Creatinine Clearance: 69.8 mL/min (by C-G formula based on SCr of 0.89 mg/dL). Liver Function Tests: Recent Labs  Lab 12/05/21 2036 12/06/21 0453 12/07/21 0444  AST 27 17 20   ALT 44 32 27  ALKPHOS 80 59 53  BILITOT 0.8 0.6 0.4  PROT 7.8 6.0* 5.7*  ALBUMIN 2.8* 2.2* 2.0*   No results for input(s): LIPASE, AMYLASE in the last 168 hours. No results for input(s): AMMONIA in the last 168 hours. Coagulation Profile: No results for input(s): INR, PROTIME in the last 168 hours. Cardiac Enzymes: No results for input(s): CKTOTAL, CKMB, CKMBINDEX, TROPONINI in the last 168 hours. BNP (last 3 results) No results for input(s): PROBNP in the last 8760 hours. HbA1C: No results for input(s): HGBA1C in the last 72 hours. CBG: No results for input(s): GLUCAP in the last 168 hours. Lipid  Profile: No results for input(s): CHOL, HDL, LDLCALC, TRIG, CHOLHDL, LDLDIRECT in the last 72 hours. Thyroid Function Tests: No results for input(s): TSH, T4TOTAL, FREET4, T3FREE, THYROIDAB in the last 72 hours. Anemia Panel: No results for input(s): VITAMINB12, FOLATE, FERRITIN, TIBC, IRON, RETICCTPCT in the last 72 hours. Sepsis Labs: Recent Labs  Lab 12/05/21 2036 12/05/21 2256  LATICACIDVEN 2.4* 1.4    Recent Results (from the past 240 hour(s))  Urine Culture     Status: Abnormal   Collection Time: 12/05/21  8:21 PM   Specimen: In/Out Cath Urine  Result Value Ref Range Status   Specimen Description   Final    IN/OUT CATH URINE Performed at [facility], [address], [place], [place] [address]    Special Requests   Final    NONE Performed at [facility], [address] [place], [place] [address]    Culture (A)  Final    >=100,000 COLONIES/mL PSEUDOMONAS AERUGINOSA 10,000 COLONIES/mL ENTEROCOCCUS FAECIUM VANCOMYCIN RESISTANT ENTEROCOCCUS ISOLATED    Report Status 12/09/2021 FINAL  Final   Organism ID, Bacteria PSEUDOMONAS AERUGINOSA (A)  Final  Organism ID, Bacteria ENTEROCOCCUS FAECIUM (A)  Final      Susceptibility   Enterococcus faecium - MIC*    AMPICILLIN >=32 RESISTANT Resistant     NITROFURANTOIN 256 RESISTANT Resistant     VANCOMYCIN >=32 RESISTANT Resistant     LINEZOLID 2 SENSITIVE Sensitive     * 10,000 COLONIES/mL ENTEROCOCCUS FAECIUM   Pseudomonas aeruginosa - MIC*    CEFTAZIDIME 4 SENSITIVE Sensitive     CIPROFLOXACIN <=0.25 SENSITIVE Sensitive     GENTAMICIN <=1 SENSITIVE Sensitive     IMIPENEM 1 SENSITIVE Sensitive     PIP/TAZO 8 SENSITIVE Sensitive     CEFEPIME 2 SENSITIVE Sensitive     * >=100,000 COLONIES/mL PSEUDOMONAS AERUGINOSA  Blood culture (routine x 2)     Status: None   Collection Time: 12/05/21  8:36 PM   Specimen: BLOOD LEFT FOREARM  Result Value Ref Range Status   Specimen Description BLOOD LEFT FOREARM  Final   Special  Requests   Final    BOTTLES DRAWN AEROBIC AND ANAEROBIC Blood Culture adequate volume   Culture   Final    NO GROWTH 6 DAYS Performed at [facility], [address], [place], [place] [address]    Report Status 12/11/2021 FINAL  Final  Blood culture (routine x 2)     Status: Abnormal   Collection Time: 12/05/21  8:36 PM   Specimen: Right Antecubital; Blood  Result Value Ref Range Status   Specimen Description   Final    RIGHT ANTECUBITAL Performed at [facility], [address], [place], [place] [address]    Special Requests   Final    BOTTLES DRAWN AEROBIC AND ANAEROBIC Blood Culture results may not be optimal due to an inadequate volume of blood received in culture bottles Performed at [facility], [address], [place], [place] [address]    Culture  Setup Time   Final    GRAM POSITIVE COCCI AEROBIC BOTTLE ONLY CRITICAL RESULT CALLED TO, READ BACK BY AND VERIFIED WITH: J JONES,RN@0338  12/07/21 [place]    Culture (A)  Final    STAPHYLOCOCCUS HOMINIS THE SIGNIFICANCE OF ISOLATING THIS ORGANISM FROM A SINGLE SET OF BLOOD CULTURES WHEN MULTIPLE SETS ARE DRAWN IS UNCERTAIN. PLEASE NOTIFY THE MICROBIOLOGY DEPARTMENT WITHIN ONE WEEK IF SPECIATION AND SENSITIVITIES ARE REQUIRED. Performed at [facility], [place] [address] [place], [place] [address]    Report Status 12/08/2021 FINAL  Final  Resp Panel by RT-PCR (Flu A&B, Covid) Nasopharyngeal Swab     Status: None   Collection Time: 12/05/21  8:36 PM   Specimen: Nasopharyngeal Swab; Nasopharyngeal(NP) swabs in vial transport medium  Result Value Ref Range Status   SARS Coronavirus 2 by RT PCR NEGATIVE NEGATIVE Final    Comment: (NOTE) SARS-CoV-2 target nucleic acids are NOT DETECTED.  The SARS-CoV-2 RNA is generally detectable in upper respiratory specimens during the acute phase of infection. The lowest concentration of SARS-CoV-2 viral copies this assay can detect is 138 copies/mL. A negative result does not preclude  SARS-Cov-2 infection and should not be used as the sole basis for treatment or other patient management decisions. A negative result may occur with  improper specimen collection/handling, submission of specimen other than nasopharyngeal swab, presence of viral mutation(s) within the areas targeted by this assay, and inadequate number of viral copies(<138 copies/mL). A negative result must be combined with clinical observations, patient history, and epidemiological information. The expected result is Negative.  Fact Sheet for Patients:  EntrepreneurPulse.com.au  Fact  Sheet for Healthcare Providers:  IncredibleEmployment.be  This test is no t yet approved or cleared by the [place] FDA and  has been authorized for detection and/or diagnosis of SARS-CoV-2 by FDA under an Emergency Use Authorization (EUA). This EUA will remain  in effect (meaning this test can be used) for the duration of the COVID-19 declaration under Section 564(b)(1) of the Act, 21 U.S.C.section 360bbb-3(b)(1), unless the authorization is terminated  or revoked sooner.       Influenza A by PCR NEGATIVE NEGATIVE Final   Influenza B by PCR NEGATIVE NEGATIVE Final    Comment: (NOTE) The Xpert Xpress SARS-CoV-2/FLU/RSV plus assay is intended as an aid in the diagnosis of influenza from Nasopharyngeal swab specimens and should not be used as a sole basis for treatment. Nasal washings and aspirates are unacceptable for Xpert Xpress SARS-CoV-2/FLU/RSV testing.  Fact Sheet for Patients: EntrepreneurPulse.com.au  Fact Sheet for Healthcare Providers: IncredibleEmployment.be  This test is not yet approved or cleared by the [place] FDA and has been authorized for detection and/or diagnosis of SARS-CoV-2 by FDA under an Emergency Use Authorization (EUA). This EUA will remain in effect (meaning this test can be used) for the duration of  the COVID-19 declaration under Section 564(b)(1) of the Act, 21 U.S.C. section 360bbb-3(b)(1), unless the authorization is terminated or revoked.  Performed at [facility], [address] [place],  [address]   Blood Culture ID Panel (Reflexed)     Status: Abnormal   Collection Time: 12/05/21  8:36 PM  Result Value Ref Range Status   Enterococcus faecalis NOT DETECTED NOT DETECTED Final   Enterococcus Faecium NOT DETECTED NOT DETECTED Final   Listeria monocytogenes NOT DETECTED NOT DETECTED Final   Staphylococcus species DETECTED (A) NOT DETECTED Final    Comment: CRITICAL RESULT CALLED TO, READ BACK BY AND VERIFIED WITH: J JONES,RN@0338  12/07/21 [place]    Staphylococcus aureus (BCID) NOT DETECTED NOT DETECTED Final   Staphylococcus epidermidis NOT DETECTED NOT DETECTED Final   Staphylococcus lugdunensis NOT DETECTED NOT DETECTED Final   Streptococcus species NOT DETECTED NOT DETECTED Final   Streptococcus agalactiae NOT DETECTED NOT DETECTED Final   Streptococcus pneumoniae NOT DETECTED NOT DETECTED Final   Streptococcus pyogenes NOT DETECTED NOT DETECTED Final   A.calcoaceticus-baumannii NOT DETECTED NOT DETECTED Final   Bacteroides fragilis NOT DETECTED NOT DETECTED Final   Enterobacterales NOT DETECTED NOT DETECTED Final   Enterobacter cloacae complex NOT DETECTED NOT DETECTED Final   Escherichia coli NOT DETECTED NOT DETECTED Final   Klebsiella aerogenes NOT DETECTED NOT DETECTED Final   Klebsiella oxytoca NOT DETECTED NOT DETECTED Final   Klebsiella pneumoniae NOT DETECTED NOT DETECTED Final   Proteus species NOT DETECTED NOT DETECTED Final   Salmonella species NOT DETECTED NOT DETECTED Final   Serratia marcescens NOT DETECTED NOT DETECTED Final   Haemophilus influenzae NOT DETECTED NOT DETECTED Final   Neisseria meningitidis NOT DETECTED NOT DETECTED Final   Pseudomonas aeruginosa NOT DETECTED NOT DETECTED Final   Stenotrophomonas maltophilia NOT DETECTED NOT DETECTED  Final   Candida albicans NOT DETECTED NOT DETECTED Final   Candida auris NOT DETECTED NOT DETECTED Final   Candida glabrata NOT DETECTED NOT DETECTED Final   Candida krusei NOT DETECTED NOT DETECTED Final   Candida parapsilosis NOT DETECTED NOT DETECTED Final   Candida tropicalis NOT DETECTED NOT DETECTED Final   Cryptococcus neoformans/gattii NOT DETECTED NOT DETECTED Final    Comment: Performed at [facility], [place],  [place] [address]  Culture, blood (routine x 2)     Status: None (Preliminary result)   Collection Time: 12/07/21 10:45 AM   Specimen: BLOOD  Result Value Ref Range Status   Specimen Description BLOOD RIGHT ANTECUBITAL  Final   Special Requests   Final    BOTTLES DRAWN AEROBIC AND ANAEROBIC Blood Culture adequate volume   Culture   Final    NO GROWTH 4 DAYS Performed at [facility], [address] [place], [place] [address]    Report Status PENDING  Incomplete  Culture, blood (routine x 2)     Status: None (Preliminary result)   Collection Time: 12/07/21 10:50 AM   Specimen: BLOOD  Result Value Ref Range Status   Specimen Description BLOOD BLOOD LEFT HAND  Final   Special Requests   Final    BOTTLES DRAWN AEROBIC AND ANAEROBIC Blood Culture adequate volume   Culture   Final    NO GROWTH 4 DAYS Performed at [facility], [address] [place],  [address]    Report Status PENDING  Incomplete         Radiology Studies: No results found.      Scheduled Meds:  budesonide (PULMICORT) nebulizer solution  0.25 mg Nebulization BID   ciprofloxacin  500 mg Oral BID   clopidogrel  75 mg Oral Daily   enoxaparin (LOVENOX) injection  40 mg Subcutaneous Q24H   feeding supplement  237 mL Oral TID BM   ipratropium-albuterol  3 mL Nebulization BID   melatonin  6 mg Oral QHS   pantoprazole  40 mg Oral Daily   rosuvastatin  20 mg Oral Daily   Continuous Infusions:     LOS: 6 days    Time spent: 35 minutes    Kathie Dike, MD [facility]   If 7PM-7AM, please contact night-coverage [email or web address]  12/11/2021, 9:12 PM

## 2021-12-11 NOTE — NC FL2 (Signed)
[place] LEVEL OF CARE SCREENING TOOL     IDENTIFICATION  Patient Name: Raymond Walker. Birthdate: 05/05/1951 Sex: male Admission Date (Current Location): 12/05/2021  [place] and [place] Number:  [employer] and Address:  [place] [address] [place]      Provider Number: [id number]  Attending Physician Name and Address:  Kathie Dike, MD  Relative Name and Phone Number:  [name] (Niece)   [phone]    Current Level of Care: Hospital Recommended Level of Care: [place] Prior Approval Number:    Date Approved/Denied:   PASRR Number: [id number]  Discharge Plan: SNF    Current Diagnoses: Patient Active Problem List   Diagnosis Date Noted   Sepsis secondary to UTI ([place]) 12/06/2021   Leukocytosis 12/06/2021   Hypoalbuminemia due to protein-calorie malnutrition ([place]) 12/06/2021   Lactic acidosis 12/06/2021   AKI (acute kidney injury) (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[place])    Goals of care, counseling/discussion    Palliative care by specialist    DNR (do not resuscitate)    Vitamin D deficiency 01/24/2020   Sepsis ([place]) 01/23/2020   Altered mental status    Hypokalemia    History of kidney stones    Hematuria    UTI (urinary tract infection)    CHRONIC Bladder outlet obstruction    PAD (peripheral artery disease) ([place]) 09/15/2019   Protein-calorie malnutrition, severe 08/21/2019   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[place])    Cellulitis of right lower extremity    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[place]) 08/19/2019   Impaired glucose  tolerance    COPD (chronic obstructive pulmonary disease) (HCC)    Thrombocytopenia (HCC) 08/10/2018   Aortic atherosclerosis ([place]) 10/14/2016   Coronary artery calcification seen on CAT scan 10/14/2016   Other emphysema ([place]) 10/14/2016   Elevated blood pressure 12/03/2011   Chest pain 12/03/2011   Tobacco abuse 12/03/2011   Right leg pain 12/03/2011    Orientation RESPIRATION BLADDER Height & Weight     Self, Time, Situation, Place  Normal Continent Weight: 142 lb 13.7 oz (64.8 kg) Height:  6' (182.9 cm)  BEHAVIORAL SYMPTOMS/MOOD NEUROLOGICAL BOWEL NUTRITION STATUS      Continent Diet (heart healthy)  AMBULATORY STATUS COMMUNICATION OF NEEDS Skin   Limited Assist   Normal                       Personal Care Assistance Level of Assistance  Bathing, Feeding, Dressing Bathing Assistance: Limited assistance Feeding assistance: Independent Dressing Assistance: Limited assistance     Functional Limitations Info  Sight, Hearing, Speech Sight Info: Adequate   Speech Info: Adequate    SPECIAL CARE FACTORS FREQUENCY  PT (By licensed PT)     PT Frequency: 5x/week              Contractures Contractures Info: Not present    Additional Factors Info  Code Status, Allergies Code Status Info: DNR Allergies Info: NKA           Current Medications (12/11/2021):  This is the current hospital active medication list Current Facility-Administered Medications  Medication  Dose Route Frequency Provider Last Rate Last Admin   acetaminophen (TYLENOL) tablet 650 mg  650 mg Oral Q6H PRN Adefeso, Oladapo, DO   650 mg at 12/11/21 0924   albuterol (PROVENTIL) (2.5 MG/3ML) 0.083% nebulizer solution 2.5 mg  2.5 mg Nebulization Q4H PRN Kathie Dike, MD       albuterol (VENTOLIN HFA) 108 (90 Base) MCG/ACT inhaler 2 puff  2 puff Inhalation Q4H PRN Adefeso, Oladapo, DO       budesonide (PULMICORT) nebulizer solution 0.25 mg  0.25 mg Nebulization BID Kathie Dike, MD   0.25 mg at  12/09/21 1955   ciprofloxacin (CIPRO) tablet 500 mg  500 mg Oral BID Kathie Dike, MD   500 mg at 12/11/21 0745   clopidogrel (PLAVIX) tablet 75 mg  75 mg Oral Daily Adefeso, Oladapo, DO   75 mg at 12/11/21 0924   enoxaparin (LOVENOX) injection 40 mg  40 mg Subcutaneous Q24H Adefeso, Oladapo, DO   40 mg at 12/09/21 0840   feeding supplement (ENSURE ENLIVE / ENSURE PLUS) liquid 237 mL  237 mL Oral TID BM Kathie Dike, MD   237 mL at 12/11/21 0924   ipratropium-albuterol (DUONEB) 0.5-2.5 (3) MG/3ML nebulizer solution 3 mL  3 mL Nebulization BID Kathie Dike, MD   3 mL at 12/09/21 1955   melatonin tablet 6 mg  6 mg Oral QHS Zierle-Ghosh, Asia B, DO   6 mg at 12/10/21 2259   pantoprazole (PROTONIX) EC tablet 40 mg  40 mg Oral Daily Adefeso, Oladapo, DO   40 mg at 12/11/21 6168   rosuvastatin (CRESTOR) tablet 20 mg  20 mg Oral Daily Adefeso, Oladapo, DO   20 mg at 12/11/21 3729     Discharge Medications: Please see discharge summary for a list of discharge medications.  Relevant Imaging Results:  Relevant Lab Results:   Additional Information SSN [id number]  Ihor Gully, LCSW

## 2021-12-12 DIAGNOSIS — N201 Calculus of ureter: Secondary | ICD-10-CM | POA: Diagnosis not present

## 2021-12-12 DIAGNOSIS — G9341 Metabolic encephalopathy: Secondary | ICD-10-CM | POA: Diagnosis not present

## 2021-12-12 DIAGNOSIS — E8729 Other acidosis: Secondary | ICD-10-CM | POA: Diagnosis not present

## 2021-12-12 DIAGNOSIS — R69 Illness, unspecified: Secondary | ICD-10-CM | POA: Diagnosis not present

## 2021-12-12 DIAGNOSIS — R1311 Dysphagia, oral phase: Secondary | ICD-10-CM | POA: Diagnosis not present

## 2021-12-12 DIAGNOSIS — Z1621 Resistance to vancomycin: Secondary | ICD-10-CM | POA: Diagnosis not present

## 2021-12-12 DIAGNOSIS — E782 Mixed hyperlipidemia: Secondary | ICD-10-CM | POA: Diagnosis not present

## 2021-12-12 DIAGNOSIS — R262 Difficulty in walking, not elsewhere classified: Secondary | ICD-10-CM | POA: Diagnosis not present

## 2021-12-12 DIAGNOSIS — Z961 Presence of intraocular lens: Secondary | ICD-10-CM | POA: Diagnosis not present

## 2021-12-12 DIAGNOSIS — H524 Presbyopia: Secondary | ICD-10-CM | POA: Diagnosis not present

## 2021-12-12 DIAGNOSIS — R1312 Dysphagia, oropharyngeal phase: Secondary | ICD-10-CM | POA: Diagnosis not present

## 2021-12-12 DIAGNOSIS — A419 Sepsis, unspecified organism: Secondary | ICD-10-CM | POA: Diagnosis not present

## 2021-12-12 DIAGNOSIS — R279 Unspecified lack of coordination: Secondary | ICD-10-CM | POA: Diagnosis not present

## 2021-12-12 DIAGNOSIS — N179 Acute kidney failure, unspecified: Secondary | ICD-10-CM | POA: Diagnosis not present

## 2021-12-12 DIAGNOSIS — R5381 Other malaise: Secondary | ICD-10-CM | POA: Diagnosis not present

## 2021-12-12 DIAGNOSIS — Z743 Need for continuous supervision: Secondary | ICD-10-CM | POA: Diagnosis not present

## 2021-12-12 DIAGNOSIS — R109 Unspecified abdominal pain: Secondary | ICD-10-CM | POA: Diagnosis not present

## 2021-12-12 DIAGNOSIS — M6281 Muscle weakness (generalized): Secondary | ICD-10-CM | POA: Diagnosis not present

## 2021-12-12 DIAGNOSIS — R4182 Altered mental status, unspecified: Secondary | ICD-10-CM | POA: Diagnosis not present

## 2021-12-12 DIAGNOSIS — K219 Gastro-esophageal reflux disease without esophagitis: Secondary | ICD-10-CM | POA: Diagnosis not present

## 2021-12-12 DIAGNOSIS — N200 Calculus of kidney: Secondary | ICD-10-CM | POA: Diagnosis not present

## 2021-12-12 DIAGNOSIS — A491 Streptococcal infection, unspecified site: Secondary | ICD-10-CM | POA: Diagnosis not present

## 2021-12-12 DIAGNOSIS — N390 Urinary tract infection, site not specified: Secondary | ICD-10-CM | POA: Diagnosis not present

## 2021-12-12 DIAGNOSIS — I739 Peripheral vascular disease, unspecified: Secondary | ICD-10-CM | POA: Diagnosis not present

## 2021-12-12 DIAGNOSIS — E46 Unspecified protein-calorie malnutrition: Secondary | ICD-10-CM | POA: Diagnosis not present

## 2021-12-12 DIAGNOSIS — I10 Essential (primary) hypertension: Secondary | ICD-10-CM | POA: Diagnosis not present

## 2021-12-12 DIAGNOSIS — N12 Tubulo-interstitial nephritis, not specified as acute or chronic: Secondary | ICD-10-CM | POA: Diagnosis not present

## 2021-12-12 DIAGNOSIS — H5213 Myopia, bilateral: Secondary | ICD-10-CM | POA: Diagnosis not present

## 2021-12-12 DIAGNOSIS — Z7401 Bed confinement status: Secondary | ICD-10-CM | POA: Diagnosis not present

## 2021-12-12 DIAGNOSIS — J449 Chronic obstructive pulmonary disease, unspecified: Secondary | ICD-10-CM | POA: Diagnosis not present

## 2021-12-12 LAB — CREATININE, SERUM
Creatinine, Ser: 0.94 mg/dL (ref 0.61–1.24)
GFR, Estimated: >60 mL/min (ref >60)

## 2021-12-12 MED ORDER — MAGNESIUM HYDROXIDE 400 MG/5ML PO SUSP
15.0000 mL | Freq: Once | ORAL | Status: DC
  Filled 2021-12-12: qty 30

## 2021-12-12 MED ORDER — CIPROFLOXACIN HCL 500 MG PO TABS
500.0000 mg | ORAL_TABLET | Freq: Two times a day (BID) | ORAL | 0 refills | Status: AC
Start: 2021-12-12 — End: 2021-12-16

## 2021-12-12 MED ORDER — TRAMADOL HCL 50 MG PO TABS: 50.0000 mg | ORAL_TABLET | Freq: Two times a day (BID) | ORAL | 0 refills | Status: DC | PRN

## 2021-12-12 NOTE — Progress Notes (Signed)
Attempt to call report, transferred to a number that rang and noone ever answered , will try again

## 2021-12-12 NOTE — TOC Transition Note (Signed)
Transition of Care [facility]) - CM/SW Discharge Note   Patient Details  Name: Raymond Walker. MRN: [id number] Date of Birth: Jan 07, 1951  [facility] of [facility]) CM/SW Contact:  Ihor Gully, LCSW Phone Number: 12/12/2021, 3:11 PM   Clinical Narrative:    Discharge clinicals sent to facility. Family notified. EMS transport arranged. RN to call report. TOC signing off.    Final next level of care: Skilled Nursing Facility Barriers to Discharge: No Barriers Identified   Patient Goals and CMS Choice     Choice offered to / list presented to : Patient  Discharge Placement              Patient chooses bed at: [facility] Patient to be transferred to facility by: [place] Name of family member notified: Niece, [name] Patient and family notified of of transfer: 12/12/21  Discharge Plan and Services                                     Social Determinants of Health ([place]) Interventions     Readmission Risk Interventions Readmission Risk Prevention Plan 12/12/2021  Transportation Screening Complete  PCP or Specialist Appt within 5-7 Days Complete  Home Care Screening Complete  Medication Review (RN CM) Complete  Some recent data might be hidden

## 2021-12-12 NOTE — Progress Notes (Signed)
Medications received from pharmacy and placed in sealed bag in patients belongings bag

## 2021-12-12 NOTE — Discharge Summary (Signed)
Physician Discharge Summary  Raymond Walker. LYY:[id number] DOB: 08-03-1951 DOA: 12/05/2021  PCP: [name], MD  Admit date: 12/05/2021 Discharge date: 12/12/2021  Admitted From: home Disposition:  SNF  Recommendations for Outpatient Follow-up:  Follow up with PCP in 1-2 weeks Please obtain BMP/CBC in one week Follow up with urology as previously scheduled for right stent removal  Discharge Condition:stable CODE STATUS:DNR Diet recommendation: heart healthy  Brief/Interim Summary: 70 year old male with a history of COPD, PAD, prior history of kidney stones, who was recently in the hospital from 12 6-12 8 when he was noted to have obstructing right-sided renal stones and UTI.  He was seen by urology and underwent right ureter stent placement.  He was discharged with Keflex.  Unclear if he completed this antibiotic.  He returns to the hospital with altered mental status and fevers.  Noted to have recurrent infection and CT indicating right-sided pyelonephritis, but no evidence of obstructing stone/hydronephrosis.  Discussed with urology who recommended continued antibiotic therapy.   Discharge Diagnoses:  Active Problems:   COPD (chronic obstructive pulmonary disease) (HCC)   PAD (peripheral artery disease) (HCC)   Sepsis secondary to UTI (HCC)   Leukocytosis   Hypoalbuminemia due to protein-calorie malnutrition (HCC)   Lactic acidosis   AKI (acute kidney injury) ([place])   Essential hypertension   Mixed hyperlipidemia   GERD (gastroesophageal reflux disease)  Sepsis secondary to pyelonephritis -Admitted with fever, tachypnea, leukocytosis -Lactic acid elevated on admission -Urine culture appears to be positive for Pseudomonas -fevers now resolved -transitioned meropenem to ciprofloxacin -He was noted to have 1 out of 2 positive blood cultures for staph hominis.  Suspect this may be a contaminant -Urine culture also indicated 10,000 colonies of Enterococcus. This is  unlikely to be true infection since he has improved without targeted treatment   Acute metabolic encephalopathy -Suspect is related to underlying sepsis -Continue to monitor -ABG shows normal PCO2 -mental status now back to baseline   COPD -no shortness of breath or wheezing -continue bronchodilators   PAD -Continue on statin and Plavix   Hyperlipidemia -Continue statin   Infrarenal aortic aneurysm -will need surveillance in 3 years   Generalized weakness -Seen by physical therapy with recommendation for skilled nursing facility placement.   -patient agreeable with placement -discussed with POA [name] [name] who also agrees  Discharge Instructions  Discharge Instructions     Diet - low sodium heart healthy   Complete by: As directed    Increase activity slowly   Complete by: As directed       Allergies as of 12/12/2021   No Known Allergies      Medication List     STOP taking these medications    metoprolol succinate 25 MG 24 hr tablet Commonly known as: Toprol XL   sildenafil 100 MG tablet Commonly known as: Viagra       TAKE these medications    albuterol 108 (90 Base) MCG/ACT inhaler Commonly known as: VENTOLIN HFA INHALE 2 PUFFS INTO THE LUNGS EVERY 4 HOURS AS NEEDED FOR WHEEZING OR SHORTNESS OF BREATH   ciprofloxacin 500 MG tablet Commonly known as: CIPRO Take 1 tablet (500 mg total) by mouth 2 (two) times daily for 4 days.   clopidogrel 75 MG tablet Commonly known as: PLAVIX TAKE 1 TABLET(75 MG) BY MOUTH DAILY WITH BREAKFAST What changed: See the new instructions.   mometasone-formoterol 200-5 MCG/ACT Aero Commonly known as: DULERA Inhale 2 puffs into the lungs 2 (two) times daily.  pantoprazole 40 MG tablet Commonly known as: PROTONIX TAKE 1 TABLET(40 MG) BY MOUTH DAILY What changed:  how much to take how to take this when to take this additional instructions   rosuvastatin 20 MG tablet Commonly known as: CRESTOR TAKE 1  TABLET(20 MG) BY MOUTH DAILY What changed:  how much to take how to take this when to take this additional instructions   sucralfate 1 g tablet Commonly known as: CARAFATE TAKE 1 TABLET(1 GRAM) BY MOUTH FOUR TIMES DAILY AT BEDTIME WITH MEALS What changed:  how much to take how to take this when to take this additional instructions   traMADol 50 MG tablet Commonly known as: ULTRAM Take 1 tablet (50 mg total) by mouth every 12 (twelve) hours as needed. TAKE 1 TABLET(50 MG) BY MOUTH DAILY AS NEEDED FOR MODERATE PAIN What changed:  how much to take how to take this when to take this reasons to take this        Contact information for after-discharge care     [facility] .   Service: Skilled Nursing Contact information: [address] [place] [phone]                    No Known Allergies  Consultations:    Procedures/Studies: CT Head Wo Contrast  Result Date: 11/21/2021 CLINICAL DATA:  Dizziness EXAM: CT HEAD WITHOUT CONTRAST TECHNIQUE: Contiguous axial images were obtained from the base of the skull through the vertex without intravenous contrast. COMPARISON:  Brain MRI 11/06/2019, CT head 01/23/2020 FINDINGS: Brain: There is no evidence of acute intracranial hemorrhage, extra-axial fluid collection, or acute infarct. There is mild parenchymal volume loss. The ventricles are normal in size. There is no significant burden of white matter microangiopathy. There is no solid mass lesion.  There is no midline shift. Vascular: There is calcification of the bilateral cavernous ICAs. Skull: Normal. Negative for fracture or focal lesion. Sinuses/Orbits: The paranasal sinuses are clear. Bilateral lens implants are in place. The globes and orbits are otherwise unremarkable. Other: None. IMPRESSION: No acute intracranial pathology. Electronically Signed   By: Valetta Mole M.D.   On:  11/21/2021 15:26   CT Abdomen Pelvis W Contrast  Result Date: 12/05/2021 CLINICAL DATA:  Right lower quadrant abdominal pain EXAM: CT ABDOMEN AND PELVIS WITH CONTRAST TECHNIQUE: Multidetector CT imaging of the abdomen and pelvis was performed using the standard protocol following bolus administration of intravenous contrast. CONTRAST:  125mL OMNIPAQUE IOHEXOL 300 MG/ML  SOLN COMPARISON:  11/21/2021 FINDINGS: Lower chest: Emphysematous changes are noted within the lung bases. Cardiac size within normal limits. Hepatobiliary: Focal fatty sparing within the gallbladder fossa. No enhancing intrahepatic mass identified. Status post cholecystectomy. No biliary dilatation. Pancreas: Unremarkable Spleen: Unremarkable Adrenals/Urinary Tract: The adrenal glands are unremarkable. The kidneys are normal in position. There is asymmetric cortical atrophy involving the upper pole the left kidney. There is an associated irregular, thick-walled cystic lesion within the upper pole demonstrating lateral mural calcification which appears unchanged from remote prior examination of 06/23/2017 is likely benign given its stability over time. Simple cortical cyst noted within the lower pole of the left kidney and upper pole the right kidney. Multiple nonobstructing renal calculi are noted within the kidneys bilaterally, stable since prior examination. Right double-J ureteral stent has been placed in the interval since prior examination extending from the right renal pelvis to the bladder. Previously noted trace hydronephrosis has  resolved. There has developed mild right perinephric stranding which tracks into the right posterior pararenal space which may be postprocedural in nature or relate to superimposed inflammation as can be seen with pyelonephritis. Stable 5 and 14 mm calculi within the distal right ureter. No ureteral calculi on the left. No hydronephrosis on the left. The bladder is mildly thick walled suggesting changes of  chronic bladder outlet obstruction, unchanged from multiple prior examinations. The bladder is not distended Stomach/Bowel: Stomach is within normal limits. Appendix appears normal. No evidence of bowel wall thickening, distention, or inflammatory changes. Vascular/Lymphatic: 3.0 cm infrarenal fusiform abdominal aortic aneurysm demonstrating moderate mural thrombus is again identified, stable since immediate prior examination and increased since remote prior examination of 06/23/2017 where this measured 2.6 cm. Right common iliac artery stent grafting has been performed with thrombosis of the excluded right common iliac artery aneurysm sac. Right external iliac artery has been stented. Thrombosis of a right common femoral-distal bypass graft is partially visualized. Thrombosis of right superficial femoral arteries partially visualized. Ectasia of left common iliac artery is unchanged. 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 No pathologic adenopathy within the abdomen and pelvis. Reproductive: Moderate prostatic enlargement with indentation of the bladder base is again noted. Other: No abdominal wall hernia.  Rectum unremarkable. Musculoskeletal: No acute bone abnormality. No lytic or blastic bone lesion. IMPRESSION: Interval right double-J ureteral stent placement with resolution of previously noted right hydronephrosis. Right ureteral stones are unchanged in position, however, measuring up to 14 mm within the distal right ureter. There has developed mild right perinephric inflammatory stranding which may be postprocedural in nature or relate to a superimposed inflammatory process such as pyelonephritis. Correlation with urinalysis may be helpful. Stable bilateral nonobstructing nephrolithiasis. Unchanged complex cystic lesion within the upper pole the left kidney, stable since remote prior examination of 06/23/2017. Emphysema. 3 cm infrarenal abdominal aortic aneurysm.  Recommend follow-up every 3 years. Reference: J Am Coll Radiol 0962;83:662-947. Peripheral vascular disease with bilateral lower extremity outflow disease, incompletely assessed on this examination. Moderate prostatic enlargement. Stable circumferential bladder wall thickening likely reflecting changes of chronic bladder outlet obstruction. No superimposed bladder distension. Aortic Atherosclerosis (ICD10-I70.0) and Emphysema (ICD10-J43.9). Aortic aneurysm NOS (ICD10-I71.9). Electronically Signed   By: Fidela Salisbury M.D.   On: 12/05/2021 22:41   CT ABDOMEN PELVIS W CONTRAST  Result Date: 11/21/2021 CLINICAL DATA:  Nausea, vomiting, dizziness EXAM: CT ABDOMEN AND PELVIS WITH CONTRAST TECHNIQUE: Multidetector CT imaging of the abdomen and pelvis was performed using the standard protocol following bolus administration of intravenous contrast. CONTRAST:  127mL OMNIPAQUE IOHEXOL 300 MG/ML  SOLN COMPARISON:  CT abdomen/pelvis 11/30/2019 FINDINGS: Lower chest: There is severe emphysema in the lung bases with essentially no lung markings in the anterior portion of the left lung base, similar to prior studies. The imaged heart is unremarkable. Hepatobiliary: The liver is diffusely hypoattenuating likely reflecting fatty infiltration. The gallbladder is surgically absent. There is no biliary ductal dilatation. Pancreas: The pancreatic body is atrophic, unchanged. There are no focal lesions or contour abnormalities. There is no main pancreatic ductal dilatation or peripancreatic inflammatory change. Spleen: A few punctate calcified granulomas are seen in the spleen. The spleen is otherwise unremarkable. Adrenals/Urinary Tract: The adrenals are unremarkable. An irregular somewhat thick-walled cyst with calcifications along the posterior wall in the left interpolar region associated with an area of marked cortical scarring measuring approximally 3.2 cm x 2.8 cm is not significantly changed going back to 2018.  Additional  renal cysts measuring up to 3.8 cm in the left lower pole are again seen, not significantly changed. Numerous nonobstructing calculi are seen in both kidneys measuring up to 8 mm in the left lower pole. There are multiple stones in the distal right ureter measuring up to 1.1 cm proximal to the UVJ, new since 11/30/2019. There is no upstream hydroureteronephrosis. There is no left hydroureteronephrosis. No stones are seen in the left ureter. The bladder wall is thickened suggesting chronic outlet obstruction due to the enlarged prostate. Stomach/Bowel: The stomach is unremarkable. There is no evidence of bowel obstruction. There is no abnormal bowel wall thickening or inflammatory change. The appendix is normal. Vascular/Lymphatic: There is extensive soft and calcified atherosclerotic plaque in the nonaneurysmal abdominal aorta which measures up to 2.6 cm in maximum diameter. A right iliac stent graft is in place. The excluded aneurysmal common iliac artery measures up to 2.0 cm in maximum dimension, unchanged since 2020. There is a presumed right superficial femoral artery stent in place. The artery is occluded just after the bifurcation, new since 11/30/2019 (3-81). The profunda femoral artery is patent to the level imaged. There is a focal dissection in the left common femoral artery, unchanged since 2020 (3-68). The left superficial femoral artery is occluded to the level imaged, unchanged since 2020. The imaged left deep femoral artery is patent. There is no abdominal or pelvic lymphadenopathy. Reproductive: The prostate is enlarged measuring up to 5.7 cm. The seminal vesicles are unremarkable. Other: There is no ascites or free air. Musculoskeletal: There is no acute osseous abnormality or aggressive osseous lesion. IMPRESSION: 1. Multiple distal right ureteral stones measuring up to 1.0 cm without upstream hydroureteronephrosis. 2. Numerous additional nonobstructing bilateral stones as above. 3. Thick-walled  left renal cyst with calcifications along the posterior wall, unchanged going back to at least 2018. 4. Occluded right superficial femoral artery graft shortly after the bifurcation, new since 11/30/2019. The deep femoral artery is patent to the level imaged. 5. Occluded left superficial femoral artery to the level imaged, unchanged since 2020. 6. Stable postprocedural changes reflecting right common iliac artery stent graft with unchanged size of the excluded sac measuring 2.0 cm. 7. Bladder wall thickening suggesting chronic outlet obstructing due to the enlarged prostate. 8. Aortic Atherosclerosis (ICD10-I70.0) and severe emphysema (ICD10-J43.9). Electronically Signed   By: Valetta Mole M.D.   On: 11/21/2021 14:59   DG CHEST PORT 1 VIEW  Result Date: 12/07/2021 CLINICAL DATA:  Altered mental status, fever, COPD, hypertension EXAM: PORTABLE CHEST 1 VIEW COMPARISON:  12/05/2021 FINDINGS: Normal heart size, mediastinal contours, and pulmonary vascularity. Lungs clear. No pulmonary infiltrate, pleural effusion, or pneumothorax. Bones demineralized. IMPRESSION: No acute abnormalities. Electronically Signed   By: Lavonia Dana M.D.   On: 12/07/2021 14:09   DG Chest Port 1 View  Result Date: 12/05/2021 CLINICAL DATA:  Altered mental status and weakness. EXAM: PORTABLE CHEST 1 VIEW COMPARISON:  November 21, 2021 FINDINGS: The lungs are hyperinflated. Mild atelectasis is seen along the lateral aspect of the mid right lung. There is no evidence of acute infiltrate, pleural effusion or pneumothorax. The heart size and mediastinal contours are within normal limits. The visualized skeletal structures are unremarkable. IMPRESSION: No active disease. Electronically Signed   By: Virgina Norfolk M.D.   On: 12/05/2021 21:10   DG Chest Portable 1 View  Result Date: 11/21/2021 CLINICAL DATA:  Dyspnea fall, dizziness EXAM: PORTABLE CHEST 1 VIEW COMPARISON:  Chest radiograph 07/26/2021 FINDINGS: The cardiomediastinal  silhouette is stable. Coarsened interstitial markings are again seen in both lungs on a background of hyperlucency likely reflecting scarring and COPD. Findings are overall similar to the prior study. There is no new or worsening focal airspace disease. There is no significant pleural effusion. There is no pneumothorax. The bones are stable. IMPRESSION: Stable findings of COPD. No evidence of acute cardiopulmonary process. Electronically Signed   By: Valetta Mole M.D.   On: 11/21/2021 11:20   DG C-Arm 1-60 Min-No Report  Result Date: 11/22/2021 Fluoroscopy was utilized by the requesting physician.  No radiographic interpretation.   LONG TERM MONITOR (3-14 DAYS)  Result Date: 11/26/2021  1 episode of NSVT lasting 11 beats  350 episodes of SVT, longest lasting 3 minutes 14 seconds with average rate 108 bpm  Frequent PACs (6.5% of beats)  Patch Wear Time:  2 days and 1 hours (2022-11-18T12:08:58-0500 to 2022-11-20T14:00:56-0500) Patient had a min HR of 47 bpm, max HR of 174 bpm, and avg HR of 81 bpm. Predominant underlying rhythm was Sinus Rhythm. 1 run of Ventricular Tachycardia occurred lasting 11 beats with a max rate of 169 bpm (avg 152 bpm). 350 Supraventricular Tachycardia  runs occurred, the run with the fastest interval lasting 5 beats with a max rate of 174 bpm, the longest lasting 3 mins 14 secs with an avg rate of 108 bpm. Some episodes of Supraventricular Tachycardia may be possible Atrial Tachycardia with variable block. Supraventricular Tachycardia was detected within +/- 45 seconds of symptomatic patient event(s). Isolated SVEs were frequent (6.5%, 15550), SVE Couplets were rare (<1.0%, 1140), and SVE Triplets were rare (<1.0%, 447). Isolated VEs were rare (<1.0%), VE Couplets were rare (<1.0%), and no VE Triplets were present. Ventricular Bigeminy was present.  4 patient triggered events, corresponding to sinus rhythm with PACs/PVCs      Subjective: No new complaints today  Discharge  Exam: Vitals:   12/11/21 0549 12/11/21 1509 12/11/21 2126 12/12/21 1315  BP: 126/74 124/61 125/68 107/62  Pulse: 70 72 80 78  Resp: 18 18  19   Temp: 97.6 F (36.4 C) 97.8 F (36.6 C) 98 F (36.7 C) 97.9 F (36.6 C)  TempSrc:  Oral Oral   SpO2: 97% 98% 96% 97%  Weight:      Height:        General: Pt is alert, awake, not in acute distress Cardiovascular: RRR, S1/S2 +, no rubs, no gallops Respiratory: CTA bilaterally, no wheezing, no rhonchi Abdominal: Soft, NT, ND, bowel sounds + Extremities: no edema, no cyanosis    The results of significant diagnostics from this hospitalization (including imaging, microbiology, ancillary and laboratory) are listed below for reference.     Microbiology: Recent Results (from the past 240 hour(s))  Urine Culture     Status: Abnormal   Collection Time: 12/05/21  8:21 PM   Specimen: In/Out Cath Urine  Result Value Ref Range Status   Specimen Description   Final    IN/OUT CATH URINE Performed at [facility], [address], [place], [place] [address]    Special Requests   Final    NONE Performed at [facility], [address], [place], [place] [address]    Culture (A)  Final    >=100,000 COLONIES/mL PSEUDOMONAS AERUGINOSA 10,000 COLONIES/mL ENTEROCOCCUS FAECIUM VANCOMYCIN RESISTANT ENTEROCOCCUS ISOLATED    Report Status 12/09/2021 FINAL  Final   Organism ID, Bacteria PSEUDOMONAS AERUGINOSA (A)  Final   Organism ID, Bacteria ENTEROCOCCUS FAECIUM (A)  Final      Susceptibility  Enterococcus faecium - MIC*    AMPICILLIN >=32 RESISTANT Resistant     NITROFURANTOIN 256 RESISTANT Resistant     VANCOMYCIN >=32 RESISTANT Resistant     LINEZOLID 2 SENSITIVE Sensitive     * 10,000 COLONIES/mL ENTEROCOCCUS FAECIUM   Pseudomonas aeruginosa - MIC*    CEFTAZIDIME 4 SENSITIVE Sensitive     CIPROFLOXACIN <=0.25 SENSITIVE Sensitive     GENTAMICIN <=1 SENSITIVE Sensitive     IMIPENEM 1 SENSITIVE Sensitive     PIP/TAZO 8 SENSITIVE Sensitive      CEFEPIME 2 SENSITIVE Sensitive     * >=100,000 COLONIES/mL PSEUDOMONAS AERUGINOSA  Blood culture (routine x 2)     Status: None   Collection Time: 12/05/21  8:36 PM   Specimen: BLOOD LEFT FOREARM  Result Value Ref Range Status   Specimen Description BLOOD LEFT FOREARM  Final   Special Requests   Final    BOTTLES DRAWN AEROBIC AND ANAEROBIC Blood Culture adequate volume   Culture   Final    NO GROWTH 6 DAYS Performed at [facility], [address] [place], [place] [address]    Report Status 12/11/2021 FINAL  Final  Blood culture (routine x 2)     Status: Abnormal   Collection Time: 12/05/21  8:36 PM   Specimen: Right Antecubital; Blood  Result Value Ref Range Status   Specimen Description   Final    RIGHT ANTECUBITAL Performed at [facility], [address], [place], [place] [address]    Special Requests   Final    BOTTLES DRAWN AEROBIC AND ANAEROBIC Blood Culture results may not be optimal due to an inadequate volume of blood received in culture bottles Performed at [facility], [address] [place], [place] [address]    Culture  Setup Time   Final    GRAM POSITIVE COCCI AEROBIC BOTTLE ONLY CRITICAL RESULT CALLED TO, READ BACK BY AND VERIFIED WITH: J JONES,RN@[id number]  12/07/21 [place]    Culture (A)  Final    STAPHYLOCOCCUS HOMINIS THE SIGNIFICANCE OF ISOLATING THIS ORGANISM FROM A SINGLE SET OF BLOOD CULTURES WHEN MULTIPLE SETS ARE DRAWN IS UNCERTAIN. PLEASE NOTIFY THE MICROBIOLOGY DEPARTMENT WITHIN ONE WEEK IF SPECIATION AND SENSITIVITIES ARE REQUIRED. Performed at [facility], [place] [address], [place], [place] [address]    Report Status 12/08/2021 FINAL  Final  Resp Panel by RT-PCR (Flu A&B, Covid) Nasopharyngeal Swab     Status: None   Collection Time: 12/05/21  8:36 PM   Specimen: Nasopharyngeal Swab; Nasopharyngeal(NP) swabs in vial transport medium  Result Value Ref Range Status   SARS Coronavirus 2 by RT PCR NEGATIVE NEGATIVE Final    Comment: (NOTE) SARS-CoV-2  target nucleic acids are NOT DETECTED.  The SARS-CoV-2 RNA is generally detectable in upper respiratory specimens during the acute phase of infection. The lowest concentration of SARS-CoV-2 viral copies this assay can detect is 138 copies/mL. A negative result does not preclude SARS-Cov-2 infection and should not be used as the sole basis for treatment or other patient management decisions. A negative result may occur with  improper specimen collection/handling, submission of specimen other than nasopharyngeal swab, presence of viral mutation(s) within the areas targeted by this assay, and inadequate number of viral copies(<138 copies/mL). A negative result must be combined with clinical observations, patient history, and epidemiological information. The expected result is Negative.  Fact Sheet for Patients:  EntrepreneurPulse.com.au  Fact Sheet for Healthcare Providers:  IncredibleEmployment.be  This test is no t yet approved or cleared  by the [place] and  has been authorized for detection and/or diagnosis of SARS-CoV-2 by FDA under an Emergency Use Authorization (EUA). This EUA will remain  in effect (meaning this test can be used) for the duration of the COVID-19 declaration under Section 564(b)(1) of the Act, 21 U.S.C.section 360bbb-3(b)(1), unless the authorization is terminated  or revoked sooner.       Influenza A by PCR NEGATIVE NEGATIVE Final   Influenza B by PCR NEGATIVE NEGATIVE Final    Comment: (NOTE) The Xpert Xpress SARS-CoV-2/FLU/RSV plus assay is intended as an aid in the diagnosis of influenza from Nasopharyngeal swab specimens and should not be used as a sole basis for treatment. Nasal washings and aspirates are unacceptable for Xpert Xpress SARS-CoV-2/FLU/RSV testing.  Fact Sheet for Patients: EntrepreneurPulse.com.au  Fact Sheet for Healthcare  Providers: IncredibleEmployment.be  This test is not yet approved or cleared by the [place] FDA and has been authorized for detection and/or diagnosis of SARS-CoV-2 by FDA under an Emergency Use Authorization (EUA). This EUA will remain in effect (meaning this test can be used) for the duration of the COVID-19 declaration under Section 564(b)(1) of the Act, 21 U.S.C. section 360bbb-3(b)(1), unless the authorization is terminated or revoked.  Performed at [facility], [address] [place], [place] [address]   Blood Culture ID Panel (Reflexed)     Status: Abnormal   Collection Time: 12/05/21  8:36 PM  Result Value Ref Range Status   Enterococcus faecalis NOT DETECTED NOT DETECTED Final   Enterococcus Faecium NOT DETECTED NOT DETECTED Final   Listeria monocytogenes NOT DETECTED NOT DETECTED Final   Staphylococcus species DETECTED (A) NOT DETECTED Final    Comment: CRITICAL RESULT CALLED TO, READ BACK BY AND VERIFIED WITH: J JONES,RN@[id number]  12/07/21 [place]    Staphylococcus aureus (BCID) NOT DETECTED NOT DETECTED Final   Staphylococcus epidermidis NOT DETECTED NOT DETECTED Final   Staphylococcus lugdunensis NOT DETECTED NOT DETECTED Final   Streptococcus species NOT DETECTED NOT DETECTED Final   Streptococcus agalactiae NOT DETECTED NOT DETECTED Final   Streptococcus pneumoniae NOT DETECTED NOT DETECTED Final   Streptococcus pyogenes NOT DETECTED NOT DETECTED Final   A.calcoaceticus-baumannii NOT DETECTED NOT DETECTED Final   Bacteroides fragilis NOT DETECTED NOT DETECTED Final   Enterobacterales NOT DETECTED NOT DETECTED Final   Enterobacter cloacae complex NOT DETECTED NOT DETECTED Final   Escherichia coli NOT DETECTED NOT DETECTED Final   Klebsiella aerogenes NOT DETECTED NOT DETECTED Final   Klebsiella oxytoca NOT DETECTED NOT DETECTED Final   Klebsiella pneumoniae NOT DETECTED NOT DETECTED Final   Proteus species NOT DETECTED NOT DETECTED Final    Salmonella species NOT DETECTED NOT DETECTED Final   Serratia marcescens NOT DETECTED NOT DETECTED Final   Haemophilus influenzae NOT DETECTED NOT DETECTED Final   Neisseria meningitidis NOT DETECTED NOT DETECTED Final   Pseudomonas aeruginosa NOT DETECTED NOT DETECTED Final   Stenotrophomonas maltophilia NOT DETECTED NOT DETECTED Final   Candida albicans NOT DETECTED NOT DETECTED Final   Candida auris NOT DETECTED NOT DETECTED Final   Candida glabrata NOT DETECTED NOT DETECTED Final   Candida krusei NOT DETECTED NOT DETECTED Final   Candida parapsilosis NOT DETECTED NOT DETECTED Final   Candida tropicalis NOT DETECTED NOT DETECTED Final   Cryptococcus neoformans/gattii NOT DETECTED NOT DETECTED Final    Comment: Performed at [facility], [place]. [address] [place], [place] [address]  Culture, blood (routine x 2)     Status: None (Preliminary result)  Collection Time: 12/07/21 10:45 AM   Specimen: BLOOD  Result Value Ref Range Status   Specimen Description BLOOD RIGHT ANTECUBITAL  Final   Special Requests   Final    BOTTLES DRAWN AEROBIC AND ANAEROBIC Blood Culture adequate volume   Culture   Final    NO GROWTH 4 DAYS Performed at [facility], [address], [place], [place] [address]    Report Status PENDING  Incomplete  Culture, blood (routine x 2)     Status: None (Preliminary result)   Collection Time: 12/07/21 10:50 AM   Specimen: BLOOD  Result Value Ref Range Status   Specimen Description BLOOD BLOOD LEFT HAND  Final   Special Requests   Final    BOTTLES DRAWN AEROBIC AND ANAEROBIC Blood Culture adequate volume   Culture   Final    NO GROWTH 4 DAYS Performed at [facility], [address] [place], [place] [address]    Report Status PENDING  Incomplete     Labs: BNP (last 3 results) No results for input(s): BNP in the last 8760 hours. Basic Metabolic Panel: Recent Labs  Lab 12/05/21 2036 12/06/21 0453 12/07/21 0444 12/08/21 0512 12/11/21 0353  12/12/21 0448  NA 132* 134* 132* 132* 130*  --   K 4.0 3.8 3.5 4.1 3.5  --   CL 96* 102 101 102 97*  --   CO2 26 23 23  21* 22  --   GLUCOSE 194* 130* 199* 114* 107*  --   BUN 24* 21 27* 18 22  --   CREATININE 1.31* 1.00 1.08 1.06 0.89 0.94  CALCIUM 8.6* 7.9* 8.0* 8.0* 8.4*  --   MG  --  1.9  --   --   --   --   PHOS  --  3.7  --   --   --   --    Liver Function Tests: Recent Labs  Lab 12/05/21 2036 12/06/21 0453 12/07/21 0444  AST 27 17 20   ALT 44 32 27  ALKPHOS 80 59 53  BILITOT 0.8 0.6 0.4  PROT 7.8 6.0* 5.7*  ALBUMIN 2.8* 2.2* 2.0*   No results for input(s): LIPASE, AMYLASE in the last 168 hours. No results for input(s): AMMONIA in the last 168 hours. CBC: Recent Labs  Lab 12/05/21 2036 12/06/21 0453 12/07/21 0444 12/08/21 0512 12/11/21 0353  WBC 17.2* 9.6 8.1 8.3 6.6  HGB 14.5 12.1* 10.9* 12.1* 12.1*  HCT 43.9 36.6* 33.7* 37.6* 36.8*  MCV 85.9 86.7 86.0 86.4 84.6  PLT 275 200 194 186 189   Cardiac Enzymes: No results for input(s): CKTOTAL, CKMB, CKMBINDEX, TROPONINI in the last 168 hours. BNP: Invalid input(s): POCBNP CBG: No results for input(s): GLUCAP in the last 168 hours. D-Dimer No results for input(s): DDIMER in the last 72 hours. Hgb A1c No results for input(s): HGBA1C in the last 72 hours. Lipid Profile No results for input(s): CHOL, HDL, LDLCALC, TRIG, CHOLHDL, LDLDIRECT in the last 72 hours. Thyroid function studies No results for input(s): TSH, T4TOTAL, T3FREE, THYROIDAB in the last 72 hours.  Invalid input(s): FREET3 Anemia work up No results for input(s): VITAMINB12, FOLATE, FERRITIN, TIBC, IRON, RETICCTPCT in the last 72 hours. Urinalysis    Component Value Date/Time   COLORURINE YELLOW 12/05/2021 2021   APPEARANCEUR CLEAR 12/05/2021 2021   LABSPEC 1.025 12/05/2021 2021   PHURINE 6.5 12/05/2021 2021   GLUCOSEU 100 (A) 12/05/2021 2021   HGBUR LARGE (A) 12/05/2021 2021   BILIRUBINUR SMALL (A) 12/05/2021 2021  BILIRUBINUR +  10/29/2019 [place] 12/05/2021 2021   PROTEINUR >300 (A) 12/05/2021 2021   NITRITE NEGATIVE 12/05/2021 2021   LEUKOCYTESUR MODERATE (A) 12/05/2021 2021   Sepsis Labs Invalid input(s): PROCALCITONIN,  WBC,  LACTICIDVEN Microbiology Recent Results (from the past 240 hour(s))  Urine Culture     Status: Abnormal   Collection Time: 12/05/21  8:21 PM   Specimen: In/Out Cath Urine  Result Value Ref Range Status   Specimen Description   Final    IN/OUT CATH URINE Performed at [facility], [address], [place], [place] [address]    Special Requests   Final    NONE Performed at [facility], [address], [place], [place] [address]    Culture (A)  Final    >=100,000 COLONIES/mL PSEUDOMONAS AERUGINOSA 10,000 COLONIES/mL ENTEROCOCCUS FAECIUM VANCOMYCIN RESISTANT ENTEROCOCCUS ISOLATED    Report Status 12/09/2021 FINAL  Final   Organism ID, Bacteria PSEUDOMONAS AERUGINOSA (A)  Final   Organism ID, Bacteria ENTEROCOCCUS FAECIUM (A)  Final      Susceptibility   Enterococcus faecium - MIC*    AMPICILLIN >=32 RESISTANT Resistant     NITROFURANTOIN 256 RESISTANT Resistant     VANCOMYCIN >=32 RESISTANT Resistant     LINEZOLID 2 SENSITIVE Sensitive     * 10,000 COLONIES/mL ENTEROCOCCUS FAECIUM   Pseudomonas aeruginosa - MIC*    CEFTAZIDIME 4 SENSITIVE Sensitive     CIPROFLOXACIN <=0.25 SENSITIVE Sensitive     GENTAMICIN <=1 SENSITIVE Sensitive     IMIPENEM 1 SENSITIVE Sensitive     PIP/TAZO 8 SENSITIVE Sensitive     CEFEPIME 2 SENSITIVE Sensitive     * >=100,000 COLONIES/mL PSEUDOMONAS AERUGINOSA  Blood culture (routine x 2)     Status: None   Collection Time: 12/05/21  8:36 PM   Specimen: BLOOD LEFT FOREARM  Result Value Ref Range Status   Specimen Description BLOOD LEFT FOREARM  Final   Special Requests   Final    BOTTLES DRAWN AEROBIC AND ANAEROBIC Blood Culture adequate volume   Culture   Final    NO GROWTH 6 DAYS Performed at [facility], [address] [place], [place] [address]    Report Status 12/11/2021 FINAL  Final  Blood culture (routine x 2)     Status: Abnormal   Collection Time: 12/05/21  8:36 PM   Specimen: Right Antecubital; Blood  Result Value Ref Range Status   Specimen Description   Final    RIGHT ANTECUBITAL Performed at [facility], [address], [place], [place] [address]    Special Requests   Final    BOTTLES DRAWN AEROBIC AND ANAEROBIC Blood Culture results may not be optimal due to an inadequate volume of blood received in culture bottles Performed at [facility], [address], [place], [place] [address]    Culture  Setup Time   Final    GRAM POSITIVE COCCI AEROBIC BOTTLE ONLY CRITICAL RESULT CALLED TO, READ BACK BY AND VERIFIED WITH: J JONES,RN@[id number]  12/07/21 [place]    Culture (A)  Final    STAPHYLOCOCCUS HOMINIS THE SIGNIFICANCE OF ISOLATING THIS ORGANISM FROM A SINGLE SET OF BLOOD CULTURES WHEN MULTIPLE SETS ARE DRAWN IS UNCERTAIN. PLEASE NOTIFY THE MICROBIOLOGY DEPARTMENT WITHIN ONE WEEK IF SPECIATION AND SENSITIVITIES ARE REQUIRED. Performed at [facility], [place] [address], [place], [place] [address]    Report Status 12/08/2021 FINAL  Final  Resp Panel by RT-PCR (Flu A&B, Covid) Nasopharyngeal Swab  Status: None   Collection Time: 12/05/21  8:36 PM   Specimen: Nasopharyngeal Swab; Nasopharyngeal(NP) swabs in vial transport medium  Result Value Ref Range Status   SARS Coronavirus 2 by RT PCR NEGATIVE NEGATIVE Final    Comment: (NOTE) SARS-CoV-2 target nucleic acids are NOT DETECTED.  The SARS-CoV-2 RNA is generally detectable in upper respiratory specimens during the acute phase of infection. The lowest concentration of SARS-CoV-2 viral copies this assay can detect is 138 copies/mL. A negative result does not preclude SARS-Cov-2 infection and should not be used as the sole basis for treatment or other patient management decisions. A negative result may occur with  improper specimen  collection/handling, submission of specimen other than nasopharyngeal swab, presence of viral mutation(s) within the areas targeted by this assay, and inadequate number of viral copies(<138 copies/mL). A negative result must be combined with clinical observations, patient history, and epidemiological information. The expected result is Negative.  Fact Sheet for Patients:  EntrepreneurPulse.com.au  Fact Sheet for Healthcare Providers:  IncredibleEmployment.be  This test is no t yet approved or cleared by the [place] FDA and  has been authorized for detection and/or diagnosis of SARS-CoV-2 by FDA under an Emergency Use Authorization (EUA). This EUA will remain  in effect (meaning this test can be used) for the duration of the COVID-19 declaration under Section 564(b)(1) of the Act, 21 U.S.C.section 360bbb-3(b)(1), unless the authorization is terminated  or revoked sooner.       Influenza A by PCR NEGATIVE NEGATIVE Final   Influenza B by PCR NEGATIVE NEGATIVE Final    Comment: (NOTE) The Xpert Xpress SARS-CoV-2/FLU/RSV plus assay is intended as an aid in the diagnosis of influenza from Nasopharyngeal swab specimens and should not be used as a sole basis for treatment. Nasal washings and aspirates are unacceptable for Xpert Xpress SARS-CoV-2/FLU/RSV testing.  Fact Sheet for Patients: EntrepreneurPulse.com.au  Fact Sheet for Healthcare Providers: IncredibleEmployment.be  This test is not yet approved or cleared by the [place] FDA and has been authorized for detection and/or diagnosis of SARS-CoV-2 by FDA under an Emergency Use Authorization (EUA). This EUA will remain in effect (meaning this test can be used) for the duration of the COVID-19 declaration under Section 564(b)(1) of the Act, 21 U.S.C. section 360bbb-3(b)(1), unless the authorization is terminated or revoked.  Performed at [facility], [address], [place], [place] [address]   Blood Culture ID Panel (Reflexed)     Status: Abnormal   Collection Time: 12/05/21  8:36 PM  Result Value Ref Range Status   Enterococcus faecalis NOT DETECTED NOT DETECTED Final   Enterococcus Faecium NOT DETECTED NOT DETECTED Final   Listeria monocytogenes NOT DETECTED NOT DETECTED Final   Staphylococcus species DETECTED (A) NOT DETECTED Final    Comment: CRITICAL RESULT CALLED TO, READ BACK BY AND VERIFIED WITH: J JONES,RN@0338  12/07/21 [place]    Staphylococcus aureus (BCID) NOT DETECTED NOT DETECTED Final   Staphylococcus epidermidis NOT DETECTED NOT DETECTED Final   Staphylococcus lugdunensis NOT DETECTED NOT DETECTED Final   Streptococcus species NOT DETECTED NOT DETECTED Final   Streptococcus agalactiae NOT DETECTED NOT DETECTED Final   Streptococcus pneumoniae NOT DETECTED NOT DETECTED Final   Streptococcus pyogenes NOT DETECTED NOT DETECTED Final   A.calcoaceticus-baumannii NOT DETECTED NOT DETECTED Final   Bacteroides fragilis NOT DETECTED NOT DETECTED Final   Enterobacterales NOT DETECTED NOT DETECTED Final   Enterobacter cloacae complex NOT DETECTED NOT DETECTED Final   Escherichia coli NOT DETECTED NOT DETECTED Final  Klebsiella aerogenes NOT DETECTED NOT DETECTED Final   Klebsiella oxytoca NOT DETECTED NOT DETECTED Final   Klebsiella pneumoniae NOT DETECTED NOT DETECTED Final   Proteus species NOT DETECTED NOT DETECTED Final   Salmonella species NOT DETECTED NOT DETECTED Final   Serratia marcescens NOT DETECTED NOT DETECTED Final   Haemophilus influenzae NOT DETECTED NOT DETECTED Final   Neisseria meningitidis NOT DETECTED NOT DETECTED Final   Pseudomonas aeruginosa NOT DETECTED NOT DETECTED Final   Stenotrophomonas maltophilia NOT DETECTED NOT DETECTED Final   Candida albicans NOT DETECTED NOT DETECTED Final   Candida auris NOT DETECTED NOT DETECTED Final   Candida glabrata NOT DETECTED NOT DETECTED Final   Candida  krusei NOT DETECTED NOT DETECTED Final   Candida parapsilosis NOT DETECTED NOT DETECTED Final   Candida tropicalis NOT DETECTED NOT DETECTED Final   Cryptococcus neoformans/gattii NOT DETECTED NOT DETECTED Final    Comment: Performed at [facility], [place] [address], [place], [place] [address]  Culture, blood (routine x 2)     Status: None (Preliminary result)   Collection Time: 12/07/21 10:45 AM   Specimen: BLOOD  Result Value Ref Range Status   Specimen Description BLOOD RIGHT ANTECUBITAL  Final   Special Requests   Final    BOTTLES DRAWN AEROBIC AND ANAEROBIC Blood Culture adequate volume   Culture   Final    NO GROWTH 4 DAYS Performed at [facility], [address], [place], [place] [address]    Report Status PENDING  Incomplete  Culture, blood (routine x 2)     Status: None (Preliminary result)   Collection Time: 12/07/21 10:50 AM   Specimen: BLOOD  Result Value Ref Range Status   Specimen Description BLOOD BLOOD LEFT HAND  Final   Special Requests   Final    BOTTLES DRAWN AEROBIC AND ANAEROBIC Blood Culture adequate volume   Culture   Final    NO GROWTH 4 DAYS Performed at [facility], [address] [place], [place] [address]    Report Status PENDING  Incomplete     Time coordinating discharge: 73mins  SIGNED:   Kathie Dike, MD  [facility] 12/12/2021, 1:56 PM   If 7PM-7AM, please contact night-coverage [email or web address]

## 2021-12-13 LAB — CULTURE, BLOOD (ROUTINE X 2)
Culture: NO GROWTH
Culture: NO GROWTH
Special Requests: ADEQUATE
Special Requests: ADEQUATE

## 2021-12-17 DIAGNOSIS — I10 Essential (primary) hypertension: Secondary | ICD-10-CM | POA: Diagnosis not present

## 2021-12-17 DIAGNOSIS — E46 Unspecified protein-calorie malnutrition: Secondary | ICD-10-CM | POA: Diagnosis not present

## 2021-12-17 DIAGNOSIS — I739 Peripheral vascular disease, unspecified: Secondary | ICD-10-CM | POA: Diagnosis not present

## 2021-12-17 DIAGNOSIS — A419 Sepsis, unspecified organism: Secondary | ICD-10-CM | POA: Diagnosis not present

## 2021-12-17 DIAGNOSIS — H5213 Myopia, bilateral: Secondary | ICD-10-CM | POA: Diagnosis not present

## 2021-12-17 DIAGNOSIS — M6281 Muscle weakness (generalized): Secondary | ICD-10-CM | POA: Diagnosis not present

## 2021-12-17 DIAGNOSIS — K219 Gastro-esophageal reflux disease without esophagitis: Secondary | ICD-10-CM | POA: Diagnosis not present

## 2021-12-17 DIAGNOSIS — R1311 Dysphagia, oral phase: Secondary | ICD-10-CM | POA: Diagnosis not present

## 2021-12-17 DIAGNOSIS — N200 Calculus of kidney: Secondary | ICD-10-CM | POA: Diagnosis not present

## 2021-12-17 DIAGNOSIS — J449 Chronic obstructive pulmonary disease, unspecified: Secondary | ICD-10-CM | POA: Diagnosis not present

## 2021-12-17 DIAGNOSIS — N390 Urinary tract infection, site not specified: Secondary | ICD-10-CM | POA: Diagnosis not present

## 2021-12-17 DIAGNOSIS — R1312 Dysphagia, oropharyngeal phase: Secondary | ICD-10-CM | POA: Diagnosis not present

## 2021-12-17 DIAGNOSIS — H524 Presbyopia: Secondary | ICD-10-CM | POA: Diagnosis not present

## 2021-12-17 DIAGNOSIS — N12 Tubulo-interstitial nephritis, not specified as acute or chronic: Secondary | ICD-10-CM | POA: Diagnosis not present

## 2021-12-17 DIAGNOSIS — R109 Unspecified abdominal pain: Secondary | ICD-10-CM | POA: Diagnosis not present

## 2021-12-17 DIAGNOSIS — N179 Acute kidney failure, unspecified: Secondary | ICD-10-CM | POA: Diagnosis not present

## 2021-12-17 DIAGNOSIS — E8729 Other acidosis: Secondary | ICD-10-CM | POA: Diagnosis not present

## 2021-12-17 DIAGNOSIS — R262 Difficulty in walking, not elsewhere classified: Secondary | ICD-10-CM | POA: Diagnosis not present

## 2021-12-17 DIAGNOSIS — N201 Calculus of ureter: Secondary | ICD-10-CM | POA: Diagnosis not present

## 2021-12-17 DIAGNOSIS — E782 Mixed hyperlipidemia: Secondary | ICD-10-CM | POA: Diagnosis not present

## 2021-12-17 DIAGNOSIS — Z961 Presence of intraocular lens: Secondary | ICD-10-CM | POA: Diagnosis not present

## 2021-12-19 ENCOUNTER — Inpatient Hospital Stay (HOSPITAL_COMMUNITY): Admission: RE | Admit: 2021-12-19 | Payer: Medicare Other | Source: Ambulatory Visit

## 2021-12-19 DIAGNOSIS — I10 Essential (primary) hypertension: Secondary | ICD-10-CM | POA: Diagnosis not present

## 2021-12-19 NOTE — Chronic Care Management (AMB) (Signed)
°  Chronic Care Management   Outreach Note  12/19/2021 Name: Raymond Walker. MRN: [id number] DOB: [birth year]  Raymond Walker. is a 71 y.o. year old male who is a primary care patient of Luking, Elayne Snare, MD. I reached out to Raymond Walker. by phone today in response to a referral sent by Mr. Raymond Manns Jr.'s primary care provider.  A second unsuccessful telephone outreach was attempted today. The patient was referred to the case management team for assistance with care management and care coordination.   Follow Up Plan: A HIPAA compliant phone message was left for the patient providing contact information and requesting a return call.  If patient returns call to provider office, please advise to call Embedded Care Management Care Guide Raymond Walker at [place], [place] [facility]  Direct Dial: [phone]

## 2021-12-20 ENCOUNTER — Ambulatory Visit: Payer: Commercial Managed Care - HMO | Admitting: Vascular Surgery

## 2021-12-20 ENCOUNTER — Ambulatory Visit (HOSPITAL_COMMUNITY): Payer: Commercial Managed Care - HMO | Attending: Vascular Surgery

## 2021-12-20 ENCOUNTER — Ambulatory Visit (HOSPITAL_COMMUNITY): Payer: Commercial Managed Care - HMO

## 2021-12-20 DIAGNOSIS — Z961 Presence of intraocular lens: Secondary | ICD-10-CM | POA: Diagnosis not present

## 2021-12-20 DIAGNOSIS — H524 Presbyopia: Secondary | ICD-10-CM | POA: Diagnosis not present

## 2021-12-25 NOTE — Chronic Care Management (AMB) (Signed)
Chronic Care Management   Note  12/25/2021 Name: Raymond Walker. MRN: [id number] DOB: June 29, 1951  Raymond Walker. is a 71 y.o. year old male who is a primary care patient of Luking, Elayne Snare, MD. I reached out to [name] by phone today in response to a referral sent by [name]'s PCP.  Raymond Walker was given information about Chronic Care Management services today including:  CCM service includes personalized support from designated clinical staff supervised by his physician, including individualized plan of care and coordination with other care providers 24/7 contact phone numbers for assistance for urgent and routine care needs. Service will only be billed when office clinical staff spend 20 minutes or more in a month to coordinate care. Only one practitioner may furnish and bill the service in a calendar month. The patient may stop CCM services at any time (effective at the end of the month) by phone call to the office staff. The patient is responsible for co-pay (up to 20% after annual deductible is met) if co-pay is required by the individual health plan.   Patient agreed to services and verbal consent obtained.   Follow up plan: Telephone appointment with care management team member scheduled for: 01/02/2022  Julian Hy, [place] [facility]  Direct Dial: [phone]

## 2022-01-01 ENCOUNTER — Ambulatory Visit (INDEPENDENT_AMBULATORY_CARE_PROVIDER_SITE_OTHER): Payer: Commercial Managed Care - HMO | Admitting: Physician Assistant

## 2022-01-01 ENCOUNTER — Encounter: Payer: Self-pay | Admitting: Physician Assistant

## 2022-01-01 ENCOUNTER — Other Ambulatory Visit: Payer: Self-pay

## 2022-01-01 VITALS — BP 110/65 | HR 92

## 2022-01-01 DIAGNOSIS — N201 Calculus of ureter: Secondary | ICD-10-CM

## 2022-01-01 DIAGNOSIS — N390 Urinary tract infection, site not specified: Secondary | ICD-10-CM | POA: Diagnosis not present

## 2022-01-01 DIAGNOSIS — R109 Unspecified abdominal pain: Secondary | ICD-10-CM

## 2022-01-01 DIAGNOSIS — Z1621 Resistance to vancomycin: Secondary | ICD-10-CM | POA: Diagnosis not present

## 2022-01-01 DIAGNOSIS — N200 Calculus of kidney: Secondary | ICD-10-CM | POA: Diagnosis not present

## 2022-01-01 DIAGNOSIS — A491 Streptococcal infection, unspecified site: Secondary | ICD-10-CM | POA: Diagnosis not present

## 2022-01-01 LAB — URINALYSIS, ROUTINE W REFLEX MICROSCOPIC
Bilirubin, UA: NEGATIVE
Glucose, UA: NEGATIVE
Ketones, UA: NEGATIVE
Nitrite, UA: NEGATIVE
Specific Gravity, UA: 1.015 (ref 1.005–1.030)
Urobilinogen, Ur: 0.2 mg/dL (ref 0.2–1.0)
pH, UA: 6 (ref 5.0–7.5)

## 2022-01-01 LAB — MICROSCOPIC EXAMINATION
RBC, Urine: >30 /hpf — AB (ref 0–2)
Renal Epithel, UA: NONE SEEN /hpf

## 2022-01-01 LAB — BLADDER SCAN AMB NON-IMAGING

## 2022-01-01 MED ORDER — CEFUROXIME AXETIL 250 MG PO TABS: 250.0000 mg | ORAL_TABLET | Freq: Two times a day (BID) | ORAL | 0 refills | Status: DC

## 2022-01-01 NOTE — Progress Notes (Signed)
01/01/2022 9:25 AM   Raymond Walker. 1950-12-31 [id number]  Referring provider: [name], MD [place],  [place] [address]  No chief complaint on file. Lower abdominal pain  HPI: 01/01/22 Raymond Walker is a 71 year old, who currently resides at [facility] re[facility] in [place], presents with a history of nephrolithiasis who presents for right ureteral stent removal. HX: The patient was seen in the ED at [place] on 12/6 and noted to have UTI and 1cm right distal ureteral stone. Dr. Alyson Ingles consulted for stent placement and tx of obstructing stones. The pt requested nephrostomy tube rather than ureteral stent and was transferred to [facility].  Following transfer and further evaluation, the patient underwent right ureteral stent placement without tether with Dr. Diona Fanti on 11/22/2021. He was discharged from [facility] on 11/23/2021 on PO Keflex.  Patient returned to the emergency department on 12/05/2021 with altered mental status and was admitted for urinary sepsis.  He was discharged on 12/12/2021 following treatment with IV meropenem.  Discharged on Cipro.  Today, the patient complains of lower abdominal and bilateral flank pain for approximately 1 week.  Urinalysis at SNF on 1/12 indicated infection.  Culture revealed vancomycin resistant Enterococcus faecalis with resistance to quinolones as well. He currently denies fever, nausea or vomiting, hematuria.  Patient's medication list indicates he is on no antibiotics at this time.  He is on Plavix.  PVR=63ml UA=11-30 WBC, >30RBC, few bacteria, culture ordered  11/21/21 Urine culture-No growth  12/05/21 >100K  Pseudomonas aeruginosa 10,000 colonies of Enterococcus faecium, Vanc and nitrofuantoin restistant  12/29/21 >100K colonies of Enterococcus faecalis, vancomycin and Quinolone resistant    11/23/21 Raymond Walker is a 71yo with a history of nephrolithiasis who presented to [facility] ER 12/6 with a 4  day history of diffuse abdominal pain, inability to urinate with nausea and vomiting. His initial work up there was notable for lactate 2.9, WBC count 14.2, Cr 0.9 and UA with moderate leukocytes, negative nitrities, pyruria and rare bacteria. CT A/P shows collection of right distal ureteral stones up to ~1cm. He was seen by Dr. Alyson Ingles at [facility] with plans to transfer to [facility] for possible R nephrostomy tube placement. Following consult with IR, pt was taken to the OR for right ureteral stent.  11/21/21 Raymond Walker is a 71yo with a history of nephrolithiasis who presented to [facility] ER with a 4 day history of diffuse abdominal pain, inability to urinate with nausea and vomiting.  The abdominal pain is dull constant mild to moderate and nonraditing. Lactate 2.9. WBC count 14.2. CT shows 1cm right distal ureteral calculus. Creatinine on admission 1.29 with baseline of 0.8. 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8   Hypertension    Impaired glucose tolerance    Pancreatitis, recurrent    Pneumonia    Thrombocytopenia ([place]) 08/10/2018   Staying in the low 100s will follow closely    Surgical History: Past Surgical History:  Procedure Laterality Date   ABDOMINAL AORTOGRAM W/LOWER EXTREMITY Bilateral 08/20/2019   Procedure:  ABDOMINAL AORTOGRAM W/LOWER EXTREMITY;  Surgeon: Marty Heck, MD;  Location: [place] [facility];  Service: Cardiovascular;  Laterality: Bilateral;   ACHILLES TENDON SURGERY Right 08/26/2020   Procedure: ACHILLES TENDON LENGTHENING;  Surgeon: Criselda Peaches, DPM;  Location: [place];  Service: Podiatry;  Laterality:  Right;   AMPUTATION Right 09/15/2019   Procedure: AMPUTATION RIGHT TOES One, Two, And Three;  Surgeon: Waynetta Sandy, MD;  Location: [place];  Service: Vascular;  Laterality: Right;   APPENDECTOMY     CATARACT EXTRACTION W/PHACO Right 05/02/2018   Procedure: CATARACT EXTRACTION PHACO AND INTRAOCULAR LENS PLACEMENT ([place]);  Surgeon: Baruch Goldmann, MD;  Location: [facility];  Service: Ophthalmology;  Laterality: Right;  CDE: 11.11   CATARACT EXTRACTION W/PHACO Left 07/04/2018   Procedure: CATARACT EXTRACTION PHACO AND INTRAOCULAR LENS PLACEMENT (IOC);  Surgeon: Baruch Goldmann, MD;  Location: [facility];  Service: Ophthalmology;  Laterality: Left;  CDE: 7.15   CHOLECYSTECTOMY     CYSTOSCOPY W/ URETERAL STENT PLACEMENT Right 11/22/2021   Procedure: CYSTOSCOPY WITH RETROGRADE PYELOGRAM/URETERAL STENT PLACEMENT;  Surgeon: Franchot Gallo, MD;  Location: [facility];  Service: Urology;  Laterality: Right;   FEMORAL-POPLITEAL BYPASS GRAFT Right 09/15/2019   Procedure: Right BYPASS GRAFT FEMORAL to Above Knee POPLITEAL ARTERY;  Surgeon: Waynetta Sandy, MD;  Location: [place];  Service: Vascular;  Laterality: Right;   FEMORAL-POPLITEAL BYPASS GRAFT Right 01/26/2020   Procedure: IRRIGATION AND DEBRIDEMENT RIGHT FEMORAL POPLITEAL BYPASS SITE;  Surgeon: Waynetta Sandy, MD;  Location: [place];  Service: Vascular;  Laterality: Right;   MULTIPLE TOOTH EXTRACTIONS     ORCHIECTOMY     PERIPHERAL VASCULAR INTERVENTION Left 08/20/2019   Procedure: PERIPHERAL VASCULAR INTERVENTION;  Surgeon: Marty Heck, MD;  Location: [place] [facility];  Service: Cardiovascular;  Laterality: Left;  common/external iliac   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   Procedure: TRANSMETATARSAL AMPUTATION;  Surgeon: Waynetta Sandy, MD;  Location: [place];  Service: Vascular;  Laterality: Right;   TRANSMETATARSAL AMPUTATION Right 08/26/2020   Procedure: TRANSMETATARSAL AMPUTATION REVISION;  Surgeon:  Criselda Peaches, DPM;  Location: [place];  Service: Podiatry;  Laterality: Right;   VASECTOMY     WOUND DEBRIDEMENT Right 08/26/2020   Procedure: EXCISION WOUND;  Surgeon: Criselda Peaches, DPM;  Location: [place];  Service: Podiatry;  Laterality: Right;    Home Medications:  Allergies as of 01/01/2022   No Known Allergies      Medication List        Accurate as of January 01, 2022  9:25 AM. If you have any questions, ask your nurse or doctor.          albuterol 108 (90 Base) MCG/ACT inhaler Commonly known as: VENTOLIN HFA INHALE 2 PUFFS INTO THE LUNGS EVERY 4 HOURS AS NEEDED FOR WHEEZING OR SHORTNESS OF BREATH   clopidogrel 75 MG tablet Commonly known as: PLAVIX TAKE 1 TABLET(75 MG) BY MOUTH DAILY WITH BREAKFAST What changed: See the new instructions.   mometasone-formoterol 200-5 MCG/ACT Aero Commonly known as: DULERA Inhale 2 puffs into the lungs 2 (two) times daily.   pantoprazole 40 MG tablet Commonly known as: PROTONIX TAKE 1 TABLET(40 MG) BY MOUTH DAILY What changed:  how much to take how to take this when to take this additional instructions   rosuvastatin 20 MG tablet Commonly known as: CRESTOR TAKE 1 TABLET(20 MG) BY MOUTH DAILY What changed:  how much to take how to take this when to take this additional instructions  sucralfate 1 g tablet Commonly known as: CARAFATE TAKE 1 TABLET(1 GRAM) BY MOUTH FOUR TIMES DAILY AT BEDTIME WITH MEALS What changed:  how much to take how to take this when to take this additional instructions   traMADol 50 MG tablet Commonly known as: ULTRAM Take 1 tablet (50 mg total) by mouth every 12 (twelve) hours as needed. TAKE 1 TABLET(50 MG) BY MOUTH DAILY AS NEEDED FOR MODERATE PAIN        Allergies: No Known Allergies  Family History: Family History  Problem Relation Age of Onset   Hypertension Father    Coronary artery disease Father    Coronary artery disease Mother    Cancer Mother        Breast     Social History:  reports that he has quit smoking. His smoking use included cigarettes. He has a 30.00 pack-year smoking history. He has quit using smokeless tobacco.  His smokeless tobacco use included chew. He reports that he does not drink alcohol and does not use drugs.  ROS: All other review of systems were reviewed and are negative except what is noted above in HPI  Physical Exam: BP 110/65    Pulse 92   Constitutional:  Alert and oriented, No acute distress. HEENT: [place] AT, moist mucus membranes.  Trachea midline, no masses. Cardiovascular: No clubbing, cyanosis, or edema. Respiratory: Normal respiratory effort, no increased work of breathing. GI: Abdomen is soft, nontender, nondistended, no abdominal masses GU: No CVA tenderness. Circumcised phallus. No masses/lesions on penis, testis, scrotum. Prostate 40g smooth no nodules no induration.  Lymph: No cervical or inguinal lymphadenopathy. Skin: No rashes, bruises or suspicious lesions. Neurologic: Grossly intact, no focal deficits, moving all 4 extremities. Psychiatric: Normal mood and affect.  Laboratory Data: Lab Results  Component Value Date   WBC 6.6 12/11/2021   HGB 12.1 (L) 12/11/2021   HCT 36.8 (L) 12/11/2021   MCV 84.6 12/11/2021   PLT 189 12/11/2021    Lab Results  Component Value Date   CREATININE 0.94 12/12/2021    Lab Results  Component Value Date   PSA 1.5 08/01/2018    No results found for: TESTOSTERONE  Lab Results  Component Value Date   HGBA1C 6.2 (H) 06/02/2021    Urinalysis    Component Value Date/Time   COLORURINE YELLOW 12/05/2021 2021   APPEARANCEUR CLEAR 12/05/2021 2021   LABSPEC 1.025 12/05/2021 2021   PHURINE 6.5 12/05/2021 2021   GLUCOSEU 100 (A) 12/05/2021 2021   HGBUR LARGE (A) 12/05/2021 2021   BILIRUBINUR SMALL (A) 12/05/2021 2021   BILIRUBINUR + 10/29/2019 [place] 12/05/2021 2021   PROTEINUR >300 (A) 12/05/2021 2021   NITRITE NEGATIVE 12/05/2021 2021    LEUKOCYTESUR MODERATE (A) 12/05/2021 2021    Lab Results  Component Value Date   BACTERIA MANY (A) 12/05/2021    Recent Results (from the past 2160 hour(s))  Comprehensive metabolic panel     Status: Abnormal   Collection Time: 11/21/21 10:39 AM  Result Value Ref Range   Sodium 138 135 - 145 mmol/L   Potassium 4.7 3.5 - 5.1 mmol/L   Chloride 106 98 - 111 mmol/L   CO2 23 22 - 32 mmol/L   Glucose, Bld 153 (H) 70 - 99 mg/dL    Comment: Glucose reference range applies only to samples taken after fasting for at least 8 hours.   BUN 46 (H) 8 - 23 mg/dL   Creatinine, Ser 1.29 (H) 0.61 - 1.24 mg/dL  Calcium 8.6 (L) 8.9 - 10.3 mg/dL   Total Protein 5.8 (L) 6.5 - 8.1 g/dL   Albumin 2.7 (L) 3.5 - 5.0 g/dL   AST 24 15 - 41 U/L   ALT 14 0 - 44 U/L   Alkaline Phosphatase 46 38 - 126 U/L   Total Bilirubin 0.3 0.3 - 1.2 mg/dL   GFR, Estimated 59 (L) >60 mL/min    Comment: (NOTE) Calculated using the CKD-EPI Creatinine Equation (2021)    Anion gap 9 5 - 15    Comment: Performed at [facility], [address], [place], [place] [address]  Lipase, blood     Status: None   Collection Time: 11/21/21 10:39 AM  Result Value Ref Range   Lipase 24 11 - 51 U/L    Comment: Performed at [facility], [address], [place], [place] [address]  CBC with Differential     Status: Abnormal   Collection Time: 11/21/21 10:39 AM  Result Value Ref Range   WBC 14.2 (H) 4.0 - 10.5 K/uL   RBC 5.14 4.22 - 5.81 MIL/uL   Hemoglobin 15.1 13.0 - 17.0 g/dL   HCT 45.4 39.0 - 52.0 %   MCV 88.3 80.0 - 100.0 fL   MCH 29.4 26.0 - 34.0 pg   MCHC 33.3 30.0 - 36.0 g/dL   RDW 14.3 11.5 - 15.5 %   Platelets 135 (L) 150 - 400 K/uL   nRBC 0.0 0.0 - 0.2 %   Neutrophils Relative % 81 %   Neutro Abs 11.5 (H) 1.7 - 7.7 K/uL   Lymphocytes Relative 4 %   Lymphs Abs 0.6 (L) 0.7 - 4.0 K/uL   Monocytes Relative 14 %   Monocytes Absolute 1.9 (H) 0.1 - 1.0 K/uL   Eosinophils Relative 0 %   Eosinophils Absolute 0.0 0.0 - 0.5  K/uL   Basophils Relative 0 %   Basophils Absolute 0.1 0.0 - 0.1 K/uL   Immature Granulocytes 1 %   Abs Immature Granulocytes 0.11 (H) 0.00 - 0.07 K/uL    Comment: Performed at [facility], [address] [place], [place] [address]  Lactic acid, plasma     Status: Abnormal   Collection Time: 11/21/21 10:39 AM  Result Value Ref Range   Lactic Acid, Venous 2.9 (HH) 0.5 - 1.9 mmol/L    Comment: CRITICAL RESULT CALLED TO, READ BACK BY AND VERIFIED WITH: CRAWFORD,H AT 11:50AM ON 11/21/21 BY [facility] Performed at [facility], [address] [place], [place] [address]   Troponin I (High Sensitivity)     Status: None   Collection Time: 11/21/21 10:39 AM  Result Value Ref Range   Troponin I (High Sensitivity) 11 <18 ng/L    Comment: (NOTE) Elevated high sensitivity troponin I (hsTnI) values and significant  changes across serial measurements may suggest ACS but many other  chronic and acute conditions are known to elevate hsTnI results.  Refer to the "Links" section for chest pain algorithms and additional  guidance. Performed at [facility], [address], [place], [place] [address]   Urinalysis, Routine w reflex microscopic Urine, Clean Catch     Status: Abnormal   Collection Time: 11/21/21 11:31 AM  Result Value Ref Range   Color, Urine AMBER (A) YELLOW    Comment: BIOCHEMICALS MAY BE AFFECTED BY COLOR   APPearance CLOUDY (A) CLEAR   Specific Gravity, Urine 1.019 1.005 - 1.030   pH 5.0 5.0 - 8.0   Glucose, UA NEGATIVE NEGATIVE mg/dL   Hgb  urine dipstick LARGE (A) NEGATIVE   Bilirubin Urine NEGATIVE NEGATIVE   Ketones, ur NEGATIVE NEGATIVE mg/dL   Protein, ur 100 (A) NEGATIVE mg/dL   Nitrite NEGATIVE NEGATIVE   Leukocytes,Ua MODERATE (A) NEGATIVE   RBC / HPF >50 (H) 0 - 5 RBC/hpf   WBC, UA >50 (H) 0 - 5 WBC/hpf   Bacteria, UA RARE (A) NONE SEEN   Squamous Epithelial / LPF 0-5 0 - 5   WBC Clumps PRESENT    Mucus PRESENT    Budding Yeast PRESENT    Hyaline Casts, UA  PRESENT    Uric Acid Crys, UA PRESENT     Comment: Performed at [facility], [address] [place], [place] [address]  Culture, blood (routine x 2)     Status: None   Collection Time: 11/21/21 12:00 PM   Specimen: Right Antecubital; Blood  Result Value Ref Range   Specimen Description RIGHT ANTECUBITAL    Special Requests      BOTTLES DRAWN AEROBIC AND ANAEROBIC Blood Culture adequate volume   Culture      NO GROWTH 5 DAYS Performed at [facility], [address], [place], [place] [address]    Report Status 11/26/2021 FINAL   Lactic acid, plasma     Status: Abnormal   Collection Time: 11/21/21 12:08 PM  Result Value Ref Range   Lactic Acid, Venous 2.4 (HH) 0.5 - 1.9 mmol/L    Comment: CRITICAL VALUE NOTED.  VALUE IS CONSISTENT WITH PREVIOUSLY REPORTED AND CALLED VALUE. Performed at [facility], [address] [place], [place] [address]   Troponin I (High Sensitivity)     Status: Abnormal   Collection Time: 11/21/21 12:35 PM  Result Value Ref Range   Troponin I (High Sensitivity) 21 (H) <18 ng/L    Comment: (NOTE) Elevated high sensitivity troponin I (hsTnI) values and significant  changes across serial measurements may suggest ACS but many other  chronic and acute conditions are known to elevate hsTnI results.  Refer to the "Links" section for chest pain algorithms and additional  guidance. Performed at [facility], [address], [place], [place] [address]   Resp Panel by RT-PCR (Flu A&B, Covid) Nasopharyngeal Swab     Status: None   Collection Time: 11/21/21  4:19 PM   Specimen: Nasopharyngeal Swab; Nasopharyngeal(NP) swabs in vial transport medium  Result Value Ref Range   SARS Coronavirus 2 by RT PCR NEGATIVE NEGATIVE    Comment: (NOTE) SARS-CoV-2 target nucleic acids are NOT DETECTED.  The SARS-CoV-2 RNA is generally detectable in upper respiratory specimens during the acute phase of infection. The lowest concentration of SARS-CoV-2 viral copies this assay can  detect is 138 copies/mL. A negative result does not preclude SARS-Cov-2 infection and should not be used as the sole basis for treatment or other patient management decisions. A negative result may occur with  improper specimen collection/handling, submission of specimen other than nasopharyngeal swab, presence of viral mutation(s) within the areas targeted by this assay, and inadequate number of viral copies(<138 copies/mL). A negative result must be combined with clinical observations, patient history, and epidemiological information. The expected result is Negative.  Fact Sheet for Patients:  EntrepreneurPulse.com.au  Fact Sheet for Healthcare Providers:  IncredibleEmployment.be  This test is no t yet approved or cleared by the [place] FDA and  has been authorized for detection and/or diagnosis of SARS-CoV-2 by FDA under an Emergency Use Authorization (EUA). This EUA will remain  in effect (meaning this test can be  used) for the duration of the COVID-19 declaration under Section 564(b)(1) of the Act, 21 U.S.C.section 360bbb-3(b)(1), unless the authorization is terminated  or revoked sooner.       Influenza A by PCR NEGATIVE NEGATIVE   Influenza B by PCR NEGATIVE NEGATIVE    Comment: (NOTE) The Xpert Xpress SARS-CoV-2/FLU/RSV plus assay is intended as an aid in the diagnosis of influenza from Nasopharyngeal swab specimens and should not be used as a sole basis for treatment. Nasal washings and aspirates are unacceptable for Xpert Xpress SARS-CoV-2/FLU/RSV testing.  Fact Sheet for Patients: EntrepreneurPulse.com.au  Fact Sheet for Healthcare Providers: IncredibleEmployment.be  This test is not yet approved or cleared by the [place] FDA and has been authorized for detection and/or diagnosis of SARS-CoV-2 by FDA under an Emergency Use Authorization (EUA). This EUA will remain in effect  (meaning this test can be used) for the duration of the COVID-19 declaration under Section 564(b)(1) of the Act, 21 U.S.C. section 360bbb-3(b)(1), unless the authorization is terminated or revoked.  Performed at [facility], [address] [place], [place] [address]   Procalcitonin - Baseline     Status: None   Collection Time: 11/21/21  5:18 PM  Result Value Ref Range   Procalcitonin 1.64 ng/mL    Comment:        Interpretation: PCT > 0.5 ng/mL and <= 2 ng/mL: Systemic infection (sepsis) is possible, but other conditions are known to elevate PCT as well. (NOTE)       Sepsis PCT Algorithm           Lower Respiratory Tract                                      Infection PCT Algorithm    ----------------------------     ----------------------------         PCT < 0.25 ng/mL                PCT < 0.10 ng/mL          Strongly encourage             Strongly discourage   discontinuation of antibiotics    initiation of antibiotics    ----------------------------     -----------------------------       PCT 0.25 - 0.50 ng/mL            PCT 0.10 - 0.25 ng/mL               OR       >80% decrease in PCT            Discourage initiation of                                            antibiotics      Encourage discontinuation           of antibiotics    ----------------------------     -----------------------------         PCT >= 0.50 ng/mL              PCT 0.26 - 0.50 ng/mL                AND       <80% decrease in PCT  Encourage initiation of                                             antibiotics       Encourage continuation           of antibiotics    ----------------------------     -----------------------------        PCT >= 0.50 ng/mL                  PCT > 0.50 ng/mL               AND         increase in PCT                  Strongly encourage                                      initiation of antibiotics    Strongly encourage escalation           of antibiotics                                      -----------------------------                                           PCT <= 0.25 ng/mL                                                 OR                                        > 80% decrease in PCT                                      Discontinue / Do not initiate                                             antibiotics  Performed at [facility], [address], [place], [place] [address]   Lactic acid, plasma     Status: None   Collection Time: 11/21/21  5:18 PM  Result Value Ref Range   Lactic Acid, Venous 1.7 0.5 - 1.9 mmol/L    Comment: Performed at [facility], [address], [place], [place] [address]  Lactic acid, plasma     Status: Abnormal   Collection Time: 11/21/21  6:43 PM  Result Value Ref Range   Lactic Acid, Venous 2.0 (HH) 0.5 - 1.9 mmol/L    Comment: CRITICAL VALUE NOTED.  VALUE IS CONSISTENT WITH PREVIOUSLY REPORTED AND CALLED VALUE. Performed at [facility], [address] [place],  [address]  Procalcitonin     Status: None   Collection Time: 11/21/21  9:09 PM  Result Value Ref Range   Procalcitonin 2.04 ng/mL    Comment:        Interpretation: PCT > 2 ng/mL: Systemic infection (sepsis) is likely, unless other causes are known. (NOTE)       Sepsis PCT Algorithm           Lower Respiratory Tract                                      Infection PCT Algorithm    ----------------------------     ----------------------------         PCT < 0.25 ng/mL                PCT < 0.10 ng/mL          Strongly encourage             Strongly discourage   discontinuation of antibiotics    initiation of antibiotics    ----------------------------     -----------------------------       PCT 0.25 - 0.50 ng/mL            PCT 0.10 - 0.25 ng/mL               OR       >80% decrease in PCT            Discourage initiation of                                            antibiotics      Encourage discontinuation           of antibiotics     ----------------------------     -----------------------------         PCT >= 0.50 ng/mL              PCT 0.26 - 0.50 ng/mL               AND       <80% decrease in PCT              Encourage initiation of                                             antibiotics       Encourage continuation           of antibiotics    ----------------------------     -----------------------------        PCT >= 0.50 ng/mL                  PCT > 0.50 ng/mL               AND         increase in PCT                  Strongly encourage                                      initiation of antibiotics  Strongly encourage escalation           of antibiotics                                     -----------------------------                                           PCT <= 0.25 ng/mL                                                 OR                                        > 80% decrease in PCT                                      Discontinue / Do not initiate                                             antibiotics  Performed at [place] [address] [place], [place] [address]   CBC with Differential/Platelet     Status: Abnormal   Collection Time: 11/22/21  3:58 AM  Result Value Ref Range   WBC 8.6 4.0 - 10.5 K/uL   RBC 4.40 4.22 - 5.81 MIL/uL   Hemoglobin 12.6 (L) 13.0 - 17.0 g/dL   HCT 38.2 (L) 39.0 - 52.0 %   MCV 86.8 80.0 - 100.0 fL   MCH 28.6 26.0 - 34.0 pg   MCHC 33.0 30.0 - 36.0 g/dL   RDW 14.4 11.5 - 15.5 %   Platelets 116 (L) 150 - 400 K/uL    Comment: SPECIMEN CHECKED FOR CLOTS Immature Platelet Fraction may be clinically indicated, consider ordering this additional test [id number] REPEATED TO VERIFY PLATELET COUNT CONFIRMED BY SMEAR    nRBC 0.0 0.0 - 0.2 %   Neutrophils Relative % 75 %   Neutro Abs 6.5 1.7 - 7.7 K/uL   Lymphocytes Relative 9 %   Lymphs Abs 0.8 0.7 - 4.0 K/uL   Monocytes Relative 15 %   Monocytes Absolute 1.3 (H) 0.1 - 1.0 K/uL   Eosinophils  Relative 0 %   Eosinophils Absolute 0.0 0.0 - 0.5 K/uL   Basophils Relative 0 %   Basophils Absolute 0.0 0.0 - 0.1 K/uL   Immature Granulocytes 1 %   Abs Immature Granulocytes 0.05 0.00 - 0.07 K/uL   Giant PLTs PRESENT     Comment: Performed at [facility], [place] [address], [place], [place] [address]  Comprehensive metabolic panel     Status: Abnormal   Collection Time: 11/22/21  3:58 AM  Result Value Ref Range   Sodium 138 135 - 145 mmol/L   Potassium 3.4 (L) 3.5 - 5.1 mmol/L   Chloride 104 98 - 111 mmol/L   CO2 27 22 -  32 mmol/L   Glucose, Bld 118 (H) 70 - 99 mg/dL    Comment: Glucose reference range applies only to samples taken after fasting for at least 8 hours.   BUN 17 8 - 23 mg/dL   Creatinine, Ser 0.81 0.61 - 1.24 mg/dL   Calcium 8.3 (L) 8.9 - 10.3 mg/dL   Total Protein 6.3 (L) 6.5 - 8.1 g/dL   Albumin 2.9 (L) 3.5 - 5.0 g/dL   AST 16 15 - 41 U/L   ALT 12 0 - 44 U/L   Alkaline Phosphatase 64 38 - 126 U/L   Total Bilirubin 1.0 0.3 - 1.2 mg/dL   GFR, Estimated >60 >60 mL/min    Comment: (NOTE) Calculated using the CKD-EPI Creatinine Equation (2021)    Anion gap 7 5 - 15    Comment: Performed at [facility], [place] [address], [place], [place] [address]  Magnesium     Status: None   Collection Time: 11/22/21  3:58 AM  Result Value Ref Range   Magnesium 1.7 1.7 - 2.4 mg/dL    Comment: Performed at [facility], [place] [address] [place], [place] [address]  Urine Culture     Status: None   Collection Time: 11/22/21  4:26 PM   Specimen: Urine, Clean Catch  Result Value Ref Range   Specimen Description      URINE, CLEAN CATCH Performed at [facility], [place] [address] [place], [place] [address]    Special Requests      NONE Performed at [facility], [place] [address], [place], [place] [address]    Culture      NO GROWTH Performed at [facility], [place] [address],  [place],  [address]    Report Status 11/23/2021 FINAL   Surgical pcr screen     Status: None   Collection Time: 11/22/21  5:12 PM   Specimen: Nasal Mucosa; Nasal Swab  Result Value Ref Range   MRSA, PCR NEGATIVE NEGATIVE   Staphylococcus aureus NEGATIVE NEGATIVE    Comment: (NOTE) The Xpert SA Assay (FDA approved for NASAL specimens in patients 75 years of age and older), is one component of a comprehensive surveillance program. It is not intended to diagnose infection nor to guide or monitor treatment. Performed at [facility], [place] [address], [place],  [address]   Procalcitonin     Status: None   Collection Time: 11/23/21  4:01 AM  Result Value Ref Range   Procalcitonin 1.31 ng/mL    Comment:        Interpretation: PCT > 0.5 ng/mL and <= 2 ng/mL: Systemic infection (sepsis) is possible, but other conditions are known to elevate PCT as well. (NOTE)       Sepsis PCT Algorithm           Lower Respiratory Tract                                      Infection PCT Algorithm    ----------------------------     ----------------------------         PCT < 0.25 ng/mL                PCT < 0.10 ng/mL          Strongly encourage             Strongly discourage  discontinuation of antibiotics    initiation of antibiotics    ----------------------------     -----------------------------       PCT 0.25 - 0.50 ng/mL            PCT 0.10 - 0.25 ng/mL               OR       >80% decrease in PCT            Discourage initiation of                                            antibiotics      Encourage discontinuation           of antibiotics    ----------------------------     -----------------------------         PCT >= 0.50 ng/mL              PCT 0.26 - 0.50 ng/mL                AND       <80% decrease in PCT             Encourage initiation of                                             antibiotics       Encourage continuation           of antibiotics     ----------------------------     -----------------------------        PCT >= 0.50 ng/mL                  PCT > 0.50 ng/mL               AND         increase in PCT                  Strongly encourage                                      initiation of antibiotics    Strongly encourage escalation           of antibiotics                                     -----------------------------                                           PCT <= 0.25 ng/mL                                                 OR                                        >  80% decrease in PCT                                      Discontinue / Do not initiate                                             antibiotics  Performed at [place] [address] [place], [place] [address]   CBC with Differential/Platelet     Status: Abnormal   Collection Time: 11/23/21  4:01 AM  Result Value Ref Range   WBC 6.0 4.0 - 10.5 K/uL   RBC 4.60 4.22 - 5.81 MIL/uL   Hemoglobin 13.1 13.0 - 17.0 g/dL   HCT 39.7 39.0 - 52.0 %   MCV 86.3 80.0 - 100.0 fL   MCH 28.5 26.0 - 34.0 pg   MCHC 33.0 30.0 - 36.0 g/dL   RDW 14.1 11.5 - 15.5 %   Platelets 127 (L) 150 - 400 K/uL   nRBC 0.0 0.0 - 0.2 %   Neutrophils Relative % 89 %   Neutro Abs 5.3 1.7 - 7.7 K/uL   Lymphocytes Relative 7 %   Lymphs Abs 0.4 (L) 0.7 - 4.0 K/uL   Monocytes Relative 3 %   Monocytes Absolute 0.2 0.1 - 1.0 K/uL   Eosinophils Relative 0 %   Eosinophils Absolute 0.0 0.0 - 0.5 K/uL   Basophils Relative 0 %   Basophils Absolute 0.0 0.0 - 0.1 K/uL   Immature Granulocytes 1 %   Abs Immature Granulocytes 0.07 0.00 - 0.07 K/uL    Comment: Performed at [facility], [place] [address] [place], [place] [address]  Basic metabolic panel     Status: Abnormal   Collection Time: 11/23/21  4:01 AM  Result Value Ref Range   Sodium 137 135 - 145 mmol/L   Potassium 3.6 3.5 - 5.1 mmol/L   Chloride 104 98 - 111 mmol/L   CO2 27 22 - 32 mmol/L    Glucose, Bld 162 (H) 70 - 99 mg/dL    Comment: Glucose reference range applies only to samples taken after fasting for at least 8 hours.   BUN 24 (H) 8 - 23 mg/dL   Creatinine, Ser 0.81 0.61 - 1.24 mg/dL   Calcium 8.4 (L) 8.9 - 10.3 mg/dL   GFR, Estimated >60 >60 mL/min    Comment: (NOTE) Calculated using the CKD-EPI Creatinine Equation (2021)    Anion gap 6 5 - 15    Comment: Performed at [facility], [place] [address] [place], [place] [address]  Urinalysis, Routine w reflex microscopic Urine, Clean Catch     Status: Abnormal   Collection Time: 12/05/21  8:21 PM  Result Value Ref Range   Color, Urine YELLOW YELLOW   APPearance CLEAR CLEAR   Specific Gravity, Urine 1.025 1.005 - 1.030   pH 6.5 5.0 - 8.0   Glucose, UA 100 (A) NEGATIVE mg/dL   Hgb urine dipstick LARGE (A) NEGATIVE   Bilirubin Urine SMALL (A) NEGATIVE   Ketones, ur NEGATIVE NEGATIVE mg/dL   Protein, ur >300 (A) NEGATIVE mg/dL   Nitrite NEGATIVE NEGATIVE   Leukocytes,Ua MODERATE (A) NEGATIVE    Comment: Performed at [facility], [address] [place], [place] [address]  Urine Culture  Status: Abnormal   Collection Time: 12/05/21  8:21 PM   Specimen: In/Out Cath Urine  Result Value Ref Range   Specimen Description      IN/OUT CATH URINE Performed at [facility], [address] [place], [place] [address]    Special Requests      NONE Performed at [facility], [address], [place], [place] [address]    Culture (A)     >=100,000 COLONIES/mL PSEUDOMONAS AERUGINOSA 10,000 COLONIES/mL ENTEROCOCCUS FAECIUM VANCOMYCIN RESISTANT ENTEROCOCCUS ISOLATED    Report Status 12/09/2021 FINAL    Organism ID, Bacteria PSEUDOMONAS AERUGINOSA (A)    Organism ID, Bacteria ENTEROCOCCUS FAECIUM (A)       Susceptibility   Enterococcus faecium - MIC*    AMPICILLIN >=32 RESISTANT Resistant     NITROFURANTOIN 256 RESISTANT Resistant     VANCOMYCIN >=32 RESISTANT Resistant     LINEZOLID 2 SENSITIVE  Sensitive     * 10,000 COLONIES/mL ENTEROCOCCUS FAECIUM   Pseudomonas aeruginosa - MIC*    CEFTAZIDIME 4 SENSITIVE Sensitive     CIPROFLOXACIN <=0.25 SENSITIVE Sensitive     GENTAMICIN <=1 SENSITIVE Sensitive     IMIPENEM 1 SENSITIVE Sensitive     PIP/TAZO 8 SENSITIVE Sensitive     CEFEPIME 2 SENSITIVE Sensitive     * >=100,000 COLONIES/mL PSEUDOMONAS AERUGINOSA  Urinalysis, Microscopic (reflex)     Status: Abnormal   Collection Time: 12/05/21  8:21 PM  Result Value Ref Range   RBC / HPF 21-50 0 - 5 RBC/hpf   WBC, UA >50 0 - 5 WBC/hpf   Bacteria, UA MANY (A) NONE SEEN   Squamous Epithelial / LPF 6-10 0 - 5    Comment: Performed at [facility], [address], [place], [place] [address]  CBC     Status: Abnormal   Collection Time: 12/05/21  8:36 PM  Result Value Ref Range   WBC 17.2 (H) 4.0 - 10.5 K/uL   RBC 5.11 4.22 - 5.81 MIL/uL   Hemoglobin 14.5 13.0 - 17.0 g/dL   HCT 43.9 39.0 - 52.0 %   MCV 85.9 80.0 - 100.0 fL   MCH 28.4 26.0 - 34.0 pg   MCHC 33.0 30.0 - 36.0 g/dL   RDW 14.1 11.5 - 15.5 %   Platelets 275 150 - 400 K/uL   nRBC 0.0 0.0 - 0.2 %    Comment: Performed at [facility], [address] [place], [place] [address]  Comprehensive metabolic panel     Status: Abnormal   Collection Time: 12/05/21  8:36 PM  Result Value Ref Range   Sodium 132 (L) 135 - 145 mmol/L   Potassium 4.0 3.5 - 5.1 mmol/L   Chloride 96 (L) 98 - 111 mmol/L   CO2 26 22 - 32 mmol/L   Glucose, Bld 194 (H) 70 - 99 mg/dL    Comment: Glucose reference range applies only to samples taken after fasting for at least 8 hours.   BUN 24 (H) 8 - 23 mg/dL   Creatinine, Ser 1.31 (H) 0.61 - 1.24 mg/dL   Calcium 8.6 (L) 8.9 - 10.3 mg/dL   Total Protein 7.8 6.5 - 8.1 g/dL   Albumin 2.8 (L) 3.5 - 5.0 g/dL   AST 27 15 - 41 U/L   ALT 44 0 - 44 U/L   Alkaline Phosphatase 80 38 - 126 U/L   Total Bilirubin 0.8 0.3 - 1.2 mg/dL   GFR, Estimated 58 (L) >60 mL/min    Comment: (NOTE) Calculated  using the CKD-EPI  Creatinine Equation (2021)    Anion gap 10 5 - 15    Comment: Performed at [facility], [address] [place], [place] [address]  Lactic acid, plasma     Status: Abnormal   Collection Time: 12/05/21  8:36 PM  Result Value Ref Range   Lactic Acid, Venous 2.4 (HH) 0.5 - 1.9 mmol/L    Comment: CRITICAL RESULT CALLED TO, READ BACK BY AND VERIFIED WITH: BELTON,C AT 2107 ON 12.20.22 BY ISLEY,B Performed at [facility], [address] [place], [place] [address]   Ethanol     Status: None   Collection Time: 12/05/21  8:36 PM  Result Value Ref Range   Alcohol, Ethyl (B) <10 <10 mg/dL    Comment: (NOTE) Lowest detectable limit for serum alcohol is 10 mg/dL.  For medical purposes only. Performed at [facility], [address] [place], [place] [address]   Rapid urine drug screen (hospital performed)     Status: None   Collection Time: 12/05/21  8:36 PM  Result Value Ref Range   Opiates NONE DETECTED NONE DETECTED   Cocaine NONE DETECTED NONE DETECTED   Benzodiazepines NONE DETECTED NONE DETECTED   Amphetamines NONE DETECTED NONE DETECTED   Tetrahydrocannabinol NONE DETECTED NONE DETECTED   Barbiturates NONE DETECTED NONE DETECTED    Comment: (NOTE) DRUG SCREEN FOR MEDICAL PURPOSES ONLY.  IF CONFIRMATION IS NEEDED FOR ANY PURPOSE, NOTIFY LAB WITHIN 5 DAYS.  LOWEST DETECTABLE LIMITS FOR URINE DRUG SCREEN Drug Class                     Cutoff (ng/mL) Amphetamine and metabolites    1000 Barbiturate and metabolites    200 Benzodiazepine                 195 Tricyclics and metabolites     300 Opiates and metabolites        300 Cocaine and metabolites        300 THC                            50 Performed at [facility], [address] [place], [place] [address]   Blood culture (routine x 2)     Status: None   Collection Time: 12/05/21  8:36 PM   Specimen: BLOOD LEFT FOREARM  Result Value Ref Range   Specimen Description BLOOD LEFT FOREARM    Special Requests      BOTTLES  DRAWN AEROBIC AND ANAEROBIC Blood Culture adequate volume   Culture      NO GROWTH 6 DAYS Performed at [facility], [address], [place], [place] [address]    Report Status 12/11/2021 FINAL   Blood culture (routine x 2)     Status: Abnormal   Collection Time: 12/05/21  8:36 PM   Specimen: Right Antecubital; Blood  Result Value Ref Range   Specimen Description      RIGHT ANTECUBITAL Performed at [facility], [address] [place] [address], [place], [place] [address]    Special Requests      BOTTLES DRAWN AEROBIC AND ANAEROBIC Blood Culture results may not be optimal due to an inadequate volume of blood received in culture bottles Performed at [facility], [address] [place], [place] [address]    Culture  Setup Time      GRAM POSITIVE COCCI AEROBIC BOTTLE ONLY CRITICAL RESULT CALLED TO, READ BACK BY AND VERIFIED WITH: J  JONES,[id number]  12/07/21 [place]    Culture (A)     STAPHYLOCOCCUS HOMINIS THE SIGNIFICANCE OF ISOLATING THIS ORGANISM FROM A SINGLE SET OF BLOOD CULTURES WHEN MULTIPLE SETS ARE DRAWN IS UNCERTAIN. PLEASE NOTIFY THE MICROBIOLOGY DEPARTMENT WITHIN ONE WEEK IF SPECIATION AND SENSITIVITIES ARE REQUIRED. Performed at [facility], [place] [address] [place], [place] [address]    Report Status 12/08/2021 FINAL   Resp Panel by RT-PCR (Flu A&B, Covid) Nasopharyngeal Swab     Status: None   Collection Time: 12/05/21  8:36 PM   Specimen: Nasopharyngeal Swab; Nasopharyngeal(NP) swabs in vial transport medium  Result Value Ref Range   SARS Coronavirus 2 by RT PCR NEGATIVE NEGATIVE    Comment: (NOTE) SARS-CoV-2 target nucleic acids are NOT DETECTED.  The SARS-CoV-2 RNA is generally detectable in upper respiratory specimens during the acute phase of infection. The lowest concentration of SARS-CoV-2 viral copies this assay can detect is 138 copies/mL. A negative result does not preclude SARS-Cov-2 infection and should not be used as the sole basis for treatment or other patient  management decisions. A negative result may occur with  improper specimen collection/handling, submission of specimen other than nasopharyngeal swab, presence of viral mutation(s) within the areas targeted by this assay, and inadequate number of viral copies(<138 copies/mL). A negative result must be combined with clinical observations, patient history, and epidemiological information. The expected result is Negative.  Fact Sheet for Patients:  EntrepreneurPulse.com.au  Fact Sheet for Healthcare Providers:  IncredibleEmployment.be  This test is no t yet approved or cleared by the [place] FDA and  has been authorized for detection and/or diagnosis of SARS-CoV-2 by FDA under an Emergency Use Authorization (EUA). This EUA will remain  in effect (meaning this test can be used) for the duration of the COVID-19 declaration under Section 564(b)(1) of the Act, 21 U.S.C.section 360bbb-3(b)(1), unless the authorization is terminated  or revoked sooner.       Influenza A by PCR NEGATIVE NEGATIVE   Influenza B by PCR NEGATIVE NEGATIVE    Comment: (NOTE) The Xpert Xpress SARS-CoV-2/FLU/RSV plus assay is intended as an aid in the diagnosis of influenza from Nasopharyngeal swab specimens and should not be used as a sole basis for treatment. Nasal washings and aspirates are unacceptable for Xpert Xpress SARS-CoV-2/FLU/RSV testing.  Fact Sheet for Patients: EntrepreneurPulse.com.au  Fact Sheet for Healthcare Providers: IncredibleEmployment.be  This test is not yet approved or cleared by the [place] FDA and has been authorized for detection and/or diagnosis of SARS-CoV-2 by FDA under an Emergency Use Authorization (EUA). This EUA will remain in effect (meaning this test can be used) for the duration of the COVID-19 declaration under Section 564(b)(1) of the Act, 21 U.S.C. section 360bbb-3(b)(1), unless the  authorization is terminated or revoked.  Performed at [facility], [address], [place], [place] [address]   Blood Culture ID Panel (Reflexed)     Status: Abnormal   Collection Time: 12/05/21  8:36 PM  Result Value Ref Range   Enterococcus faecalis NOT DETECTED NOT DETECTED   Enterococcus Faecium NOT DETECTED NOT DETECTED   Listeria monocytogenes NOT DETECTED NOT DETECTED   Staphylococcus species DETECTED (A) NOT DETECTED    Comment: CRITICAL RESULT CALLED TO, READ BACK BY AND VERIFIED WITH: J JONES,RN@[id number]  12/07/21 [place]    Staphylococcus aureus (BCID) NOT DETECTED NOT DETECTED   Staphylococcus epidermidis NOT DETECTED NOT DETECTED   Staphylococcus lugdunensis NOT DETECTED NOT DETECTED   Streptococcus species NOT DETECTED NOT DETECTED  Streptococcus agalactiae NOT DETECTED NOT DETECTED   Streptococcus pneumoniae NOT DETECTED NOT DETECTED   Streptococcus pyogenes NOT DETECTED NOT DETECTED   A.calcoaceticus-baumannii NOT DETECTED NOT DETECTED   Bacteroides fragilis NOT DETECTED NOT DETECTED   Enterobacterales NOT DETECTED NOT DETECTED   Enterobacter cloacae complex NOT DETECTED NOT DETECTED   Escherichia coli NOT DETECTED NOT DETECTED   Klebsiella aerogenes NOT DETECTED NOT DETECTED   Klebsiella oxytoca NOT DETECTED NOT DETECTED   Klebsiella pneumoniae NOT DETECTED NOT DETECTED   Proteus species NOT DETECTED NOT DETECTED   Salmonella species NOT DETECTED NOT DETECTED   Serratia marcescens NOT DETECTED NOT DETECTED   Haemophilus influenzae NOT DETECTED NOT DETECTED   Neisseria meningitidis NOT DETECTED NOT DETECTED   Pseudomonas aeruginosa NOT DETECTED NOT DETECTED   Stenotrophomonas maltophilia NOT DETECTED NOT DETECTED   Candida albicans NOT DETECTED NOT DETECTED   Candida auris NOT DETECTED NOT DETECTED   Candida glabrata NOT DETECTED NOT DETECTED   Candida krusei NOT DETECTED NOT DETECTED   Candida parapsilosis NOT DETECTED NOT DETECTED   Candida tropicalis NOT DETECTED  NOT DETECTED   Cryptococcus neoformans/gattii NOT DETECTED NOT DETECTED    Comment: Performed at [facility], [place] [address] [place], [place] [address]  Lactic acid, plasma     Status: None   Collection Time: 12/05/21 10:56 PM  Result Value Ref Range   Lactic Acid, Venous 1.4 0.5 - 1.9 mmol/L    Comment: Performed at [facility], [address], [place], [place] [address]  Comprehensive metabolic panel     Status: Abnormal   Collection Time: 12/06/21  4:53 AM  Result Value Ref Range   Sodium 134 (L) 135 - 145 mmol/L   Potassium 3.8 3.5 - 5.1 mmol/L   Chloride 102 98 - 111 mmol/L   CO2 23 22 - 32 mmol/L   Glucose, Bld 130 (H) 70 - 99 mg/dL    Comment: Glucose reference range applies only to samples taken after fasting for at least 8 hours.   BUN 21 8 - 23 mg/dL   Creatinine, Ser 1.00 0.61 - 1.24 mg/dL   Calcium 7.9 (L) 8.9 - 10.3 mg/dL   Total Protein 6.0 (L) 6.5 - 8.1 g/dL   Albumin 2.2 (L) 3.5 - 5.0 g/dL   AST 17 15 - 41 U/L   ALT 32 0 - 44 U/L   Alkaline Phosphatase 59 38 - 126 U/L   Total Bilirubin 0.6 0.3 - 1.2 mg/dL   GFR, Estimated >60 >60 mL/min    Comment: (NOTE) Calculated using the CKD-EPI Creatinine Equation (2021)    Anion gap 9 5 - 15    Comment: Performed at [facility], [address] [place], [place] [address]  CBC     Status: Abnormal   Collection Time: 12/06/21  4:53 AM  Result Value Ref Range   WBC 9.6 4.0 - 10.5 K/uL   RBC 4.22 4.22 - 5.81 MIL/uL   Hemoglobin 12.1 (L) 13.0 - 17.0 g/dL   HCT 36.6 (L) 39.0 - 52.0 %   MCV 86.7 80.0 - 100.0 fL   MCH 28.7 26.0 - 34.0 pg   MCHC 33.1 30.0 - 36.0 g/dL   RDW 14.3 11.5 - 15.5 %   Platelets 200 150 - 400 K/uL   nRBC 0.0 0.0 - 0.2 %    Comment: Performed at [facility], [address] [place],  [address]  APTT     Status: None   Collection Time: 12/06/21  4:53 AM  Result Value Ref Range   aPTT 33 24 - 36 seconds    Comment: Performed at [facility], [address] [place], [place] [address]   Magnesium     Status: None   Collection Time: 12/06/21  4:53 AM  Result Value Ref Range   Magnesium 1.9 1.7 - 2.4 mg/dL    Comment: Performed at [facility] [address] [place], [place] [address]  Phosphorus     Status: None   Collection Time: 12/06/21  4:53 AM  Result Value Ref Range   Phosphorus 3.7 2.5 - 4.6 mg/dL    Comment: Performed at [facility] [address], [place], [place] [address]  Blood gas, arterial     Status: None   Collection Time: 12/06/21  4:03 PM  Result Value Ref Range   FIO2 21.00    pH, Arterial 7.433 7.350 - 7.450   pCO2 arterial 37.9 32.0 - 48.0 mmHg   pO2, Arterial 95.4 83.0 - 108.0 mmHg   Bicarbonate 25.5 20.0 - 28.0 mmol/L   Acid-Base Excess 1.0 0.0 - 2.0 mmol/L   O2 Saturation 96.3 %   Patient temperature 38.0    Collection site RIGHT RADIAL    Drawn by [phone]    Sample type ARTERIAL DRAW    Allens test (pass/fail) PASS PASS    Comment: Performed at [facility], [address] [place], [place] [address]  CBC     Status: Abnormal   Collection Time: 12/07/21  4:44 AM  Result Value Ref Range   WBC 8.1 4.0 - 10.5 K/uL   RBC 3.92 (L) 4.22 - 5.81 MIL/uL   Hemoglobin 10.9 (L) 13.0 - 17.0 g/dL   HCT 33.7 (L) 39.0 - 52.0 %   MCV 86.0 80.0 - 100.0 fL   MCH 27.8 26.0 - 34.0 pg   MCHC 32.3 30.0 - 36.0 g/dL   RDW 14.2 11.5 - 15.5 %   Platelets 194 150 - 400 K/uL   nRBC 0.0 0.0 - 0.2 %    Comment: Performed at [facility], [address] [place], [place] [address]  Comprehensive metabolic panel     Status: Abnormal   Collection Time: 12/07/21  4:44 AM  Result Value Ref Range   Sodium 132 (L) 135 - 145 mmol/L   Potassium 3.5 3.5 - 5.1 mmol/L   Chloride 101 98 - 111 mmol/L   CO2 23 22 - 32 mmol/L   Glucose, Bld 199 (H) 70 - 99 mg/dL    Comment: Glucose reference range applies only to samples taken after fasting for at least 8 hours.   BUN 27 (H) 8 - 23 mg/dL   Creatinine, Ser 1.08 0.61 - 1.24 mg/dL   Calcium 8.0 (L) 8.9 - 10.3 mg/dL   Total  Protein 5.7 (L) 6.5 - 8.1 g/dL   Albumin 2.0 (L) 3.5 - 5.0 g/dL   AST 20 15 - 41 U/L   ALT 27 0 - 44 U/L   Alkaline Phosphatase 53 38 - 126 U/L   Total Bilirubin 0.4 0.3 - 1.2 mg/dL   GFR, Estimated >60 >60 mL/min    Comment: (NOTE) Calculated using the CKD-EPI Creatinine Equation (2021)    Anion gap 8 5 - 15    Comment: Performed at [facility], [address], [place], [place] [address]  Culture, blood (routine x 2)     Status: None   Collection Time: 12/07/21 10:45 AM   Specimen: BLOOD  Result Value Ref Range  Specimen Description BLOOD RIGHT ANTECUBITAL    Special Requests      BOTTLES DRAWN AEROBIC AND ANAEROBIC Blood Culture adequate volume   Culture      NO GROWTH 6 DAYS Performed at [facility], [address] [place], [place] [address]    Report Status 12/13/2021 FINAL   Culture, blood (routine x 2)     Status: None   Collection Time: 12/07/21 10:50 AM   Specimen: BLOOD  Result Value Ref Range   Specimen Description BLOOD BLOOD LEFT HAND    Special Requests      BOTTLES DRAWN AEROBIC AND ANAEROBIC Blood Culture adequate volume   Culture      NO GROWTH 6 DAYS Performed at [facility], [address], [place], [place] [address]    Report Status 12/13/2021 FINAL   CBC     Status: Abnormal   Collection Time: 12/08/21  5:12 AM  Result Value Ref Range   WBC 8.3 4.0 - 10.5 K/uL   RBC 4.35 4.22 - 5.81 MIL/uL   Hemoglobin 12.1 (L) 13.0 - 17.0 g/dL   HCT 37.6 (L) 39.0 - 52.0 %   MCV 86.4 80.0 - 100.0 fL   MCH 27.8 26.0 - 34.0 pg   MCHC 32.2 30.0 - 36.0 g/dL   RDW 14.5 11.5 - 15.5 %   Platelets 186 150 - 400 K/uL    Comment: SPECIMEN CHECKED FOR CLOTS REPEATED TO VERIFY PLATELET COUNT CONFIRMED BY SMEAR    nRBC 0.0 0.0 - 0.2 %    Comment: Performed at [facility], [address], [place], [place] [address]  Basic metabolic panel     Status: Abnormal   Collection Time: 12/08/21  5:12 AM  Result Value Ref Range   Sodium 132 (L) 135 - 145 mmol/L   Potassium 4.1  3.5 - 5.1 mmol/L   Chloride 102 98 - 111 mmol/L   CO2 21 (L) 22 - 32 mmol/L   Glucose, Bld 114 (H) 70 - 99 mg/dL    Comment: Glucose reference range applies only to samples taken after fasting for at least 8 hours.   BUN 18 8 - 23 mg/dL   Creatinine, Ser 1.06 0.61 - 1.24 mg/dL   Calcium 8.0 (L) 8.9 - 10.3 mg/dL   GFR, Estimated >60 >60 mL/min    Comment: (NOTE) Calculated using the CKD-EPI Creatinine Equation (2021)    Anion gap 9 5 - 15    Comment: Performed at [facility], [address] [place], [place] [address]  CBC     Status: Abnormal   Collection Time: 12/11/21  3:53 AM  Result Value Ref Range   WBC 6.6 4.0 - 10.5 K/uL   RBC 4.35 4.22 - 5.81 MIL/uL   Hemoglobin 12.1 (L) 13.0 - 17.0 g/dL   HCT 36.8 (L) 39.0 - 52.0 %   MCV 84.6 80.0 - 100.0 fL   MCH 27.8 26.0 - 34.0 pg   MCHC 32.9 30.0 - 36.0 g/dL   RDW 14.3 11.5 - 15.5 %   Platelets 189 150 - 400 K/uL   nRBC 0.0 0.0 - 0.2 %    Comment: Performed at [facility], [address], [place],  [address]  Basic metabolic panel     Status: Abnormal   Collection Time: 12/11/21  3:53 AM  Result Value Ref Range   Sodium 130 (L) 135 - 145 mmol/L   Potassium 3.5 3.5 - 5.1 mmol/L   Chloride 97 (L) 98 - 111 mmol/L  CO2 22 22 - 32 mmol/L   Glucose, Bld 107 (H) 70 - 99 mg/dL    Comment: Glucose reference range applies only to samples taken after fasting for at least 8 hours.   BUN 22 8 - 23 mg/dL   Creatinine, Ser 0.89 0.61 - 1.24 mg/dL   Calcium 8.4 (L) 8.9 - 10.3 mg/dL   GFR, Estimated >60 >60 mL/min    Comment: (NOTE) Calculated using the CKD-EPI Creatinine Equation (2021)    Anion gap 11 5 - 15    Comment: Performed at [facility], [address] [place], [place] [address]  Creatinine, serum     Status: None   Collection Time: 12/12/21  4:48 AM  Result Value Ref Range   Creatinine, Ser 0.94 0.61 - 1.24 mg/dL   GFR, Estimated >60 >60 mL/min    Comment: (NOTE) Calculated using the CKD-EPI Creatinine Equation  (2021) Performed at [facility], [address] [place],  [address]   BLADDER SCAN AMB NON-IMAGING     Status: None   Collection Time: 01/01/22  9:40 AM  Result Value Ref Range   Scan Result 82ml     Pertinent imaging:  Narrative & Impression  CLINICAL DATA:  Right lower quadrant abdominal pain   EXAM: CT ABDOMEN AND PELVIS WITH CONTRAST   TECHNIQUE: Multidetector CT imaging of the abdomen and pelvis was performed using the standard protocol following bolus administration of intravenous contrast.   CONTRAST:  134mL OMNIPAQUE IOHEXOL 300 MG/ML  SOLN   COMPARISON:  11/21/2021   FINDINGS: Lower chest: Emphysematous changes are noted within the lung bases. Cardiac size within normal limits.   Hepatobiliary: Focal fatty sparing within the gallbladder fossa. No enhancing intrahepatic mass identified. Status post cholecystectomy. No biliary dilatation.   Pancreas: Unremarkable   Spleen: Unremarkable   Adrenals/Urinary Tract: The adrenal glands are unremarkable. The kidneys are normal in position. There is asymmetric cortical atrophy involving the upper pole the left kidney. There is an associated irregular, thick-walled cystic lesion within the upper pole demonstrating lateral mural calcification which appears unchanged from remote prior examination of 06/23/2017 is likely benign given its stability over time. Simple cortical cyst noted within the lower pole of the left kidney and upper pole the right kidney. Multiple nonobstructing renal calculi are noted within the kidneys bilaterally, stable since prior examination. Right double-J ureteral stent has been placed in the interval since prior examination extending from the right renal pelvis to the bladder. Previously noted trace hydronephrosis has resolved. There has developed mild right perinephric stranding which tracks into the right posterior pararenal space which may be postprocedural in nature or relate  to superimposed inflammation as can be seen with pyelonephritis. Stable 5 and 14 mm calculi within the distal right ureter. No ureteral calculi on the left. No hydronephrosis on the left. The bladder is mildly thick walled suggesting changes of chronic bladder outlet obstruction, unchanged from multiple prior examinations. The bladder is not distended   Stomach/Bowel: Stomach is within normal limits. Appendix appears normal. No evidence of bowel wall thickening, distention, or inflammatory changes.   Vascular/Lymphatic: 3.0 cm infrarenal fusiform abdominal aortic aneurysm demonstrating moderate mural thrombus is again identified, stable since immediate prior examination and increased since remote prior examination of 06/23/2017 where this measured 2.6 cm. Right common iliac artery stent grafting has been performed with thrombosis of the excluded right common iliac artery aneurysm sac. Right external iliac artery has been stented. Thrombosis of a right common femoral-distal bypass graft is  partially visualized. Thrombosis of right superficial femoral arteries partially visualized. Ectasia of left common iliac artery is unchanged. 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   No pathologic adenopathy within the abdomen and pelvis.   Reproductive: Moderate prostatic enlargement with indentation of the bladder base is again noted.   Other: No abdominal wall hernia.  Rectum unremarkable.   Musculoskeletal: No acute bone abnormality. No lytic or blastic bone lesion.   IMPRESSION: Interval right double-J ureteral stent placement with resolution of previously noted right hydronephrosis. Right ureteral stones are unchanged in position, however, measuring up to 14 mm within the distal right ureter. There has developed mild right perinephric inflammatory stranding which may be postprocedural in nature or relate to a superimposed inflammatory  process such as pyelonephritis. Correlation with urinalysis may be helpful.   Stable bilateral nonobstructing nephrolithiasis.   Unchanged complex cystic lesion within the upper pole the left kidney, stable since remote prior examination of 06/23/2017.   Emphysema.   3 cm infrarenal abdominal aortic aneurysm. Recommend follow-up every 3 years.   Reference: J Am Coll Radiol 2778;24:235-361.   Peripheral vascular disease with bilateral lower extremity outflow disease, incompletely assessed on this examination.   Moderate prostatic enlargement. Stable circumferential bladder wall thickening likely reflecting changes of chronic bladder outlet obstruction. No superimposed bladder distension.   Aortic Atherosclerosis (ICD10-I70.0) and Emphysema (ICD10-J43.9). Aortic aneurysm NOS (ICD10-I71.9).     Electronically Signed   By: Fidela Salisbury M.D.   On: 12/05/2021 22:41       Assessment & Plan:   1. Urinary tract infection without hematuria, site unspecified The patient was placed on Ceftin 250 twice daily for 7 days.  Culture pending.  Will consider maintaining patient on an HS dose of antibiotics until surgery date.  2. Nephrolithiasis Follow-up in 2 weeks after KUB to discuss further treatment  3. Right ureteral stone Follow-up in 2 weeks urinary to discuss laser lithotripsy removal of ureteral stent  4. Abdominal pain, unspecified abdominal location Likely stent and UTI related.   Patient is advised to return to the emergency department if it is unbearable. No follow-ups on file.  [place], Berneice Heinrich, PA-C  [facility] [place]

## 2022-01-01 NOTE — Progress Notes (Signed)
post void residual=36ml

## 2022-01-02 ENCOUNTER — Ambulatory Visit (INDEPENDENT_AMBULATORY_CARE_PROVIDER_SITE_OTHER): Payer: Medicare Other | Admitting: *Deleted

## 2022-01-02 DIAGNOSIS — L97509 Non-pressure chronic ulcer of other part of unspecified foot with unspecified severity: Secondary | ICD-10-CM

## 2022-01-02 DIAGNOSIS — J441 Chronic obstructive pulmonary disease with (acute) exacerbation: Secondary | ICD-10-CM

## 2022-01-02 DIAGNOSIS — E11621 Type 2 diabetes mellitus with foot ulcer: Secondary | ICD-10-CM

## 2022-01-03 ENCOUNTER — Telehealth: Payer: Self-pay

## 2022-01-03 ENCOUNTER — Other Ambulatory Visit: Payer: Self-pay | Admitting: Urology

## 2022-01-03 DIAGNOSIS — N201 Calculus of ureter: Secondary | ICD-10-CM

## 2022-01-03 LAB — URINE CULTURE

## 2022-01-03 NOTE — Telephone Encounter (Signed)
Jinny Blossom (pt's nurse @ [facility]) called and made aware.

## 2022-01-03 NOTE — Chronic Care Management (AMB) (Signed)
Chronic Care Management    Clinical Social Work Note  01/03/2022 Name: Raymond Walker. MRN: [id number] DOB: 27-Dec-1950  Raymond Walker. is a 71 y.o. year old male who is a primary care patient of Luking, Elayne Snare, MD. The CCM team was consulted to assist the patient with chronic disease management and/or care coordination needs related to: Level of Care Concerns.   Engaged with patient by telephone for initial visit in response to provider referral for social work chronic care management and care coordination services.   Consent to Services:  The patient was given information about Chronic Care Management services, agreed to services, and gave verbal consent prior to initiation of services.  Please see initial visit note for detailed documentation.   Patient agreed to services and consent obtained.   Assessment: Review of patient past medical history, allergies, medications, and health status, including review of relevant consultants reports was performed today as part of a comprehensive evaluation and provision of chronic care management and care coordination services.     SDOH (Social Determinants of Health) assessments and interventions performed:  SDOH Interventions    Flowsheet Row Most Recent Value  SDOH Interventions   Physical Activity Interventions Other (Comments)  [currently at SNF rehab]        Advanced Directives Status: See Care Plan for related entries.  CCM Care Plan  No Known Allergies  Outpatient Encounter Medications as of 01/02/2022  Medication Sig Note   albuterol (VENTOLIN HFA) 108 (90 Base) MCG/ACT inhaler INHALE 2 PUFFS INTO THE LUNGS EVERY 4 HOURS AS NEEDED FOR WHEEZING OR SHORTNESS OF BREATH    cefUROXime (CEFTIN) 250 MG tablet Take 1 tablet (250 mg total) by mouth 2 (two) times daily with a meal for 7 days.    clopidogrel (PLAVIX) 75 MG tablet TAKE 1 TABLET(75 MG) BY MOUTH DAILY WITH BREAKFAST (Patient taking differently: Take 75 mg by mouth daily.  TAKE 1 TABLET(75 MG) BY MOUTH DAILY WITH BREAKFAST) 12/06/2021: Time of last dose unk   mometasone-formoterol (DULERA) 200-5 MCG/ACT AERO Inhale 2 puffs into the lungs 2 (two) times daily.    pantoprazole (PROTONIX) 40 MG tablet TAKE 1 TABLET(40 MG) BY MOUTH DAILY (Patient taking differently: Take 40 mg by mouth daily.)    rosuvastatin (CRESTOR) 20 MG tablet TAKE 1 TABLET(20 MG) BY MOUTH DAILY (Patient taking differently: Take 20 mg by mouth daily.)    sucralfate (CARAFATE) 1 g tablet TAKE 1 TABLET(1 GRAM) BY MOUTH FOUR TIMES DAILY AT BEDTIME WITH MEALS (Patient taking differently: Take 1 g by mouth 4 (four) times daily.)    traMADol (ULTRAM) 50 MG tablet Take 1 tablet (50 mg total) by mouth every 12 (twelve) hours as needed. TAKE 1 TABLET(50 MG) BY MOUTH DAILY AS NEEDED FOR MODERATE PAIN    No facility-administered encounter medications on file as of 01/02/2022.    Patient Active Problem List   Diagnosis Date Noted   Right ureteral stone 01/01/2022   Abdominal pain 01/01/2022   Sepsis secondary to UTI ([place]) 12/06/2021   Leukocytosis 12/06/2021   Hypoalbuminemia due to protein-calorie malnutrition (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[facility])    Goals of care, counseling/discussion    Palliative care by specialist    DNR (do not resuscitate)    Vitamin D deficiency 01/24/2020   Sepsis ([place]) 01/23/2020   Altered mental status    Hypokalemia    History of kidney stones    Hematuria    UTI (urinary tract infection)    CHRONIC Bladder outlet obstruction    PAD (peripheral artery disease)  ([place]) 09/15/2019   Protein-calorie malnutrition, severe 08/21/2019   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[place])    Cellulitis of right lower extremity    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[place]) 08/19/2019   Impaired glucose tolerance    COPD (chronic obstructive pulmonary disease) (HCC)    Thrombocytopenia (H[facility]) 08/10/2018   Aortic atherosclerosis ([place]) 10/14/2016   Coronary artery calcification seen on CAT scan 10/14/2016   Other emphysema ([place]) 10/14/2016   Elevated blood pressure 12/03/2011   Chest pain 12/03/2011   Tobacco abuse 12/03/2011   Right leg pain 12/03/2011    Conditions to be addressed/monitored: DMII; Level of care concerns  Care Plan : LCSW Plan of Care  Updates made by Deirdre Peer, LCSW since 01/03/2022 12:00 AM     Problem: Social and Functional Symptoms   Priority: High  Note:   Current barriers:   Unable to independently live- currently at [facility] rehab in [place], [place] Unable to perform ADLs independently Currently unable to  independently self manage needs related to chronic health conditions.  Knowledge Deficits related to short term plan for care coordination needs and long term plans for chronic disease management needs Clinical Goals: patient will work with SW to address concerns related to long term care needs Interventions:   CSW spoke with pt's niece/HCPOA, [name], who shared that pt is currently in SNF rehab at [place]. She does not feel he can live independently and is hoping for long term placement- perhaps ALF level of care.  CSW suggested she follow up with the SNF staff to alert them to the needs and that he cannot live independently or return to home at dc. Pt has Medicaid as a secondary payor so hopefully can secure a Medicaid bed. CSW suggested to niece that she continue to search for ALF's she likes and is interested in as well as to check with the particular facilities to see about bed availability,etc.  CSW has also placed a call  to the SNF staff and await call back.   Assessment of needs, barriers , as well as how impacting  Review various resources and discussed options  ( ALF, SNF) Collaboration with PCP regarding development and update of comprehensive plan of care as evidenced by provider attestation and co-signature Inter-disciplinary care team collaboration (see longitudinal plan of care) Patient interviewed and appropriate assessments performed Discussed plans with patient for ongoing care management follow up and provided patient with direct contact information for care management team Advised patient's niece to call/visit ALF's she is interested in for bed availability and to discuss long term care needs with SNF staff asap to ensure an appropriate bed can be secured when SNF stay ends. Assisted patient/caregiver with obtaining information about health plan benefits Provided education to patient/caregiver regarding level of care options.  Other interventions provided: Mindfulness or Relaxation training provided Emotional Support Provided Caregiver stress acknowledged  Patient self-care activities: Over the next 20 days   Continue with SNF PT/OT therapy Collaborate with the discharge planner at [facility] rehab regarding needs for  long term placement and not able to return home alone  Follow Up Plan:   SW will follow up with patient by phone over the next 3 days        Follow Up Plan: SW will follow up with patient by phone over the next 3 days      Eduard Clos MSW, [place] Licensed Clinical Social Worker [place]    [phone]

## 2022-01-03 NOTE — Addendum Note (Signed)
Addended by: Simonne Come A on: 01/03/2022 01:35 PM   Modules accepted: Orders

## 2022-01-03 NOTE — Progress Notes (Signed)
Surgical Physician Order Form [facility]  * Scheduling expectation :  01/18/2022  *Length of Case: 60 minutes  *MD Preforming Case: Nicolette Bang, MD  *Assistant Needed: no  *Facility Preference: [facility]  *Clearance needed: no  *Anticoagulation Instructions: Hold all anticoagulants  *Aspirin Instructions: Ok to continue Aspirin  -Admit type: OUTpatient  -Anesthesia: General  -Use Standing Orders:  NA  *Diagnosis: Right Ureteral Stone  *Procedure: right  Ureteroscopy w/laser lithotripsy & stent exchange (52356)right retrograde pyelogram  Additional orders: N/A  -Equipment: Holmium laser, C-arm -VTE Prophylaxis Standing Order SCDs       Other:   -Standing Lab Orders Per Anesthesia    Lab other: None  -Standing Test orders EKG/Chest x-ray per Anesthesia       Test other:   - Medications:  Ceftriaxone(Rocephin) 1gm IV  -Other orders:  PAS  *Post-op visit Date/Instructions:  1 week cysto stent removal

## 2022-01-03 NOTE — Telephone Encounter (Signed)
-----   Message from Reynaldo Minium, [place] sent at 01/03/2022  1:14 PM EST ----- Urine cx indicates pt may DC antibiotics for UTI. Pt has lithotripsy orders in for scheduling. Please contact [facility] SNF to let pt's nurse know he may DC Ceftin. Thanks!!   ----- Message ----- From: Interface, [facility] Lab Results In Sent: 01/01/2022  11:58 AM EST To: Reynaldo Minium, PA-C

## 2022-01-03 NOTE — Patient Instructions (Signed)
Visit Information   Thank you for taking time to visit with me today. Please don't hesitate to contact me if I can be of assistance to you before our next scheduled telephone appointment.  Following are the goals we discussed today:  (Copy and paste patient goals from clinical care plan here)  Our next appointment is by telephone on 01/05/22 at 10am  Please call the care guide team at [phone] if you need to cancel or reschedule your appointment.   If you are experiencing a Mental Health or [place] or need someone to talk to, please call the [facility]: [phone] call [phone]   Following is a copy of your full care plan:  Care Plan : LCSW Plan of Care  Updates made by Deirdre Peer, LCSW since 01/03/2022 12:00 AM     Problem: Social and Functional Symptoms   Priority: High  Note:   Current barriers:   Unable to independently live- currently at [facility] rehab in [place], [place] Unable to perform ADLs independently Currently unable to  independently self manage needs related to chronic health conditions.  Knowledge Deficits related to short term plan for care coordination needs and long term plans for chronic disease management needs Clinical Goals: patient will work with SW to address concerns related to long term care needs Interventions:   CSW spoke with pt's niece/HCPOA, [name], who shared that pt is currently in SNF rehab at [place]. She does not feel he can live independently and is hoping for long term placement- perhaps ALF level of care.  CSW suggested she follow up with the SNF staff to alert them to the needs and that he cannot live independently or return to home at dc. Pt has Medicaid as a secondary payor so hopefully can secure a Medicaid bed. CSW suggested to niece that she continue to search for ALF's she likes and is interested in as well as to check with the particular facilities to see about bed availability,etc.  CSW has also  placed a call to the SNF staff and await call back.   Assessment of needs, barriers , as well as how impacting  Review various resources and discussed options  ( ALF, SNF) Collaboration with PCP regarding development and update of comprehensive plan of care as evidenced by provider attestation and co-signature Inter-disciplinary care team collaboration (see longitudinal plan of care) Patient interviewed and appropriate assessments performed Discussed plans with patient for ongoing care management follow up and provided patient with direct contact information for care management team Advised patient's niece to call/visit ALF's she is interested in for bed availability and to discuss long term care needs with SNF staff asap to ensure an appropriate bed can be secured when SNF stay ends. Assisted patient/caregiver with obtaining information about health plan benefits Provided education to patient/caregiver regarding level of care options.  Other interventions provided: Mindfulness or Relaxation training provided Emotional Support Provided Caregiver stress acknowledged  Patient self-care activities: Over the next 20 days   Continue with SNF PT/OT therapy Collaborate with the discharge planner at [facility] rehab regarding needs for long term placement and not able to return home alone  Follow Up Plan:   SW will follow up with patient by phone over the next 3 days       Consent to CCM Services: Mr. Tsang was given information about Chronic Care Management services including:  CCM service includes personalized support from designated clinical staff supervised by his physician, including  individualized plan of care and coordination with other care providers 24/7 contact phone numbers for assistance for urgent and routine care needs. Service will only be billed when office clinical staff spend 20 minutes or more in a month to coordinate care. Only one practitioner may furnish and bill the service in  a calendar month. The patient may stop CCM services at any time (effective at the end of the month) by phone call to the office staff. The patient will be responsible for cost sharing (co-pay) of up to 20% of the service fee (after annual deductible is met).  Patient agreed to services and verbal consent obtained.   Patient verbalizes understanding of instructions and care plan provided today and agrees to view in [place]. Active MyChart status confirmed with patient.    Telephone follow up appointment with care management team member scheduled for:01/05/22

## 2022-01-04 ENCOUNTER — Telehealth: Payer: Self-pay

## 2022-01-04 ENCOUNTER — Other Ambulatory Visit: Payer: Self-pay

## 2022-01-04 DIAGNOSIS — K219 Gastro-esophageal reflux disease without esophagitis: Secondary | ICD-10-CM | POA: Diagnosis not present

## 2022-01-04 DIAGNOSIS — R5381 Other malaise: Secondary | ICD-10-CM | POA: Diagnosis not present

## 2022-01-04 DIAGNOSIS — N12 Tubulo-interstitial nephritis, not specified as acute or chronic: Secondary | ICD-10-CM | POA: Diagnosis not present

## 2022-01-04 DIAGNOSIS — G9341 Metabolic encephalopathy: Secondary | ICD-10-CM | POA: Diagnosis not present

## 2022-01-04 NOTE — Telephone Encounter (Signed)
I spoke with Mrs. Raymond Walker ([name] POA). We have discussed possible surgery dates and Thursday January 26th,2023 was agreed upon by all parties.   Patient given information about surgery date, what to expect pre-operatively and post operatively. We discussed that a pre-op nurse will be calling to set up the pre-op visit that will take place prior to surgery.   Informed patient that our office will communicate any additional care to be provided after surgery. Patients questions or concerns were discussed during our call. Advised to call our office should there be any additional information, questions or concerns that arise. Patient verbalized understanding.

## 2022-01-04 NOTE — Progress Notes (Signed)
[facility] Posting Form   Surgery Date/Time: Date: 01/11/2022  Surgeon: Dr. Nicolette Bang, MD  Surgery Location: Day Surgery  Inpt ( No  )   Outpt (Yes)   Obs ( No  )   Diagnosis: N20.1 Right Ureteral Stone  -CPT: [phone]  Surgery: Right Ureteroscopy with laser lithotripsy and stent exchange with right retrograde pyelogram  Stop Anticoagulations: Yes, may continue ASA  Cardiac/Medical/Pulmonary Clearance needed: no  *Orders entered into EPIC  Date: 01/04/22   *Case booked in [place]  Date: 01/04/22  *Notified pt of Surgery: Date: 01/04/22  *Placed into Prior Authorization Work Fabio Bering Date: 01/04/22   Assistant/laser/rep:No

## 2022-01-05 ENCOUNTER — Telehealth: Payer: Medicare Other | Admitting: *Deleted

## 2022-01-05 NOTE — Patient Instructions (Signed)
° °   [name]  01/05/2022     @PREFPERIOPPHARMACY @   Your procedure is scheduled on 01/11/2022.   Report to [facility] at  1015 A.M. @ the Main Entrance.   Call this number if you have problems the morning of surgery:  [phone]   Remember:  Do not eat or drink after midnight.             Use your inhalers  before you come and bring your rescue inhaler with you.            Your last dose of plavix should be on 01/05/2022.       Take these medicines the morning of surgery with A SIP OF WATER                                                depakote, proronix, tramadol(if needed).      Do not wear jewelry, make-up or nail polish.  Do not wear lotions, powders, or perfumes, or deodorant.  Do not shave 48 hours prior to surgery.  Men may shave face and neck.  Do not bring valuables to the hospital.  [facility] is not responsible for any belongings or valuables.  Contacts, dentures or bridgework may not be worn into surgery.  Leave your suitcase in the car.  After surgery it may be brought to your room.  For patients admitted to the hospital, discharge time will be determined by your treatment team.  Patients discharged the day of surgery will not be allowed to drive home and must have someone with them for 24 hours.    Special instructions:   DO NOT smoke tobacco or vape for 24 hours before your procedure.

## 2022-01-08 ENCOUNTER — Telehealth: Payer: Self-pay

## 2022-01-08 NOTE — Pre-Procedure Instructions (Signed)
°  Patient Calls (Newest Message First) Dorisann Frames, RN routed conversation to Dennis Bast; Alyson Ingles Candee Furbish, MD 2 minutes ago (2:54 PM)   Dorisann Frames, RN 2 minutes ago (2:54 PM)   Patient facility called and stated they had not held patient plavix for his upcoming surgery.   Discussed with Dr. Alyson Ingles and patient okay to continue plavix since patient has not held. Per Dr. Alyson Ingles patient stone location okay to continue Plavix.   Spoke with Ubaldo Glassing at patient facility and notified per Dr. Alyson Ingles          Note    (Name not recorded)   Dorisann Frames, RN 5 minutes ago (2:50 PM)       Recent Patient Communication   Last Update Reason Specialty    Today -- Urology    [facility], [place] [place] Open    4 days ago Surgery Scheduling Urology    [facility], [place] Closed    5 days ago -- Urology    [facility] [place], [place] Closed    2 weeks ago Chronic Care Management Family Medicine    [facility] [place], Staci T Closed    1 month ago -- Family Medicine    [facility] [place] [place], Scott A Open

## 2022-01-08 NOTE — Telephone Encounter (Signed)
Patient facility called and stated they had not held patient plavix for his upcoming surgery.  Discussed with Dr. Alyson Ingles and patient okay to continue plavix since patient has not held. Per Dr. Alyson Ingles patient stone location okay to continue Plavix.  Spoke with Ubaldo Glassing at patient facility and notified per Dr. Alyson Ingles

## 2022-01-08 NOTE — Pre-Procedure Instructions (Signed)
[place] for Pre-op information. Patient was transfered to [facility] yesterday. Called [facility] and went over preop information and faxed copy of it. Called [employer], niece and [place] and left message for her to call us back concerning surgery and consent. [phone].

## 2022-01-08 NOTE — Pre-Procedure Instructions (Signed)
Spoke with [employer], niece and POA about up coming surgery. She gave verbal consent for procedure and faxed POA papers to [place]. All questions were answered to her satisfaction.

## 2022-01-09 ENCOUNTER — Encounter (HOSPITAL_COMMUNITY)
Admission: RE | Admit: 2022-01-09 | Discharge: 2022-01-09 | Disposition: A | Discharge status: Home / Self Care | Payer: Commercial Managed Care - HMO | Source: Ambulatory Visit | Attending: Urology | Admitting: Urology

## 2022-01-10 ENCOUNTER — Ambulatory Visit: Payer: Medicare Other | Admitting: *Deleted

## 2022-01-10 DIAGNOSIS — L97509 Non-pressure chronic ulcer of other part of unspecified foot with unspecified severity: Secondary | ICD-10-CM

## 2022-01-10 DIAGNOSIS — J441 Chronic obstructive pulmonary disease with (acute) exacerbation: Secondary | ICD-10-CM

## 2022-01-10 DIAGNOSIS — R5383 Other fatigue: Secondary | ICD-10-CM

## 2022-01-10 NOTE — Chronic Care Management (AMB) (Signed)
Chronic Care Management    Clinical Social Work Note  01/10/2022 Name: Raymond Walker. MRN: [id number] DOB: 1951-07-05  Raymond Walker. is a 71 y.o. year old male who is a primary care patient of Luking, Elayne Snare, MD. The CCM team was consulted to assist the patient with chronic disease management and/or care coordination needs related to: Level of Care Concerns.   Engaged with patient by telephone for follow up visit in response to provider referral for social work chronic care management and care coordination services.   Consent to Services:  The patient was given information about Chronic Care Management services, agreed to services, and gave verbal consent prior to initiation of services.  Please see initial visit note for detailed documentation.   Patient agreed to services and consent obtained.   Assessment: Review of patient past medical history, allergies, medications, and health status, including review of relevant consultants reports was performed today as part of a comprehensive evaluation and provision of chronic care management and care coordination services.     SDOH (Social Determinants of Health) assessments and interventions performed:    Advanced Directives Status: See Care Plan for related entries.  CCM Care Plan  No Known Allergies  Outpatient Encounter Medications as of 01/10/2022  Medication Sig   albuterol (VENTOLIN HFA) 108 (90 Base) MCG/ACT inhaler INHALE 2 PUFFS INTO THE LUNGS EVERY 4 HOURS AS NEEDED FOR WHEEZING OR SHORTNESS OF BREATH   clopidogrel (PLAVIX) 75 MG tablet TAKE 1 TABLET(75 MG) BY MOUTH DAILY WITH BREAKFAST   divalproex (DEPAKOTE) 125 MG DR tablet Take 125 mg by mouth 3 (three) times daily.   fluticasone-salmeterol (ADVAIR) 500-50 MCG/ACT AEPB Inhale 1 puff into the lungs in the morning and at bedtime.   Nutritional Supplement LIQD Take 1 Bottle by mouth 2 (two) times daily. Mighty Shake   pantoprazole (PROTONIX) 40 MG tablet TAKE 1 TABLET(40  MG) BY MOUTH DAILY (Patient taking differently: Take 40 mg by mouth daily.)   rosuvastatin (CRESTOR) 20 MG tablet TAKE 1 TABLET(20 MG) BY MOUTH DAILY (Patient taking differently: Take 20 mg by mouth daily.)   sucralfate (CARAFATE) 1 g tablet TAKE 1 TABLET(1 GRAM) BY MOUTH FOUR TIMES DAILY AT BEDTIME WITH MEALS (Patient taking differently: Take 1 g by mouth 4 (four) times daily -  before meals and at bedtime.)   traMADol (ULTRAM) 50 MG tablet Take 1 tablet (50 mg total) by mouth every 12 (twelve) hours as needed. TAKE 1 TABLET(50 MG) BY MOUTH DAILY AS NEEDED FOR MODERATE PAIN   No facility-administered encounter medications on file as of 01/10/2022.    Patient Active Problem List   Diagnosis Date Noted   Right ureteral stone 01/01/2022   Abdominal pain 01/01/2022   Sepsis secondary to UTI ([place]) 12/06/2021   Leukocytosis 12/06/2021   Hypoalbuminemia due to protein-calorie malnutrition ([place]) 12/06/2021   Lactic acidosis 12/06/2021   AKI (acute kidney injury) ([place]) 12/06/2021   Essential hypertension 12/06/2021   Mixed hyperlipidemia 12/06/2021   GERD (gastroesophageal reflux disease) 12/06/2021   Nephrolithiasis 11/21/2021   Low back pain 09/19/2021   Cognitive dysfunction 06/23/2020   History of seizures 05/31/2020   Erectile dysfunction 05/02/2020   Caregiver with fatigue 05/02/2020   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[place])    Goals of care, counseling/discussion    Palliative care by specialist    DNR (do not resuscitate)    Vitamin D deficiency 01/24/2020   Sepsis ([place]) 01/23/2020   Altered mental status    Hypokalemia  History of kidney stones    Hematuria    UTI (urinary tract infection)    CHRONIC Bladder outlet obstruction    PAD (peripheral artery disease) ([place]) 09/15/2019   Protein-calorie malnutrition, severe 08/21/2019   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[place])    Cellulitis of right lower extremity    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[place]) 08/19/2019   Impaired  glucose tolerance    COPD (chronic obstructive pulmonary disease) (H[facility])    Thrombocytopenia ([place]) 08/10/2018   Aortic atherosclerosis ([place]) 10/14/2016   Coronary artery calcification seen on CAT scan 10/14/2016   Other emphysema ([place]) 10/14/2016   Elevated blood pressure 12/03/2011   Chest pain 12/03/2011   Tobacco abuse 12/03/2011   Right leg pain 12/03/2011    Conditions to be addressed/monitored:  ALF placement ; Level of care concerns  Care Plan : LCSW Plan of Care  Updates made by Deirdre Peer, LCSW since 01/10/2022 12:00 AM     Problem: Social and Functional Symptoms Resolved 01/10/2022  Priority: High  Note:   Current barriers:   Unable to independently live- currently at [facility] rehab in [place], [place] Unable to perform ADLs independently Currently unable to  independently self manage needs related to chronic health conditions.  Knowledge Deficits related to short term plan for care coordination needs and long term plans for chronic disease management needs Clinical Goals: patient will work with SW to address concerns related to long term care needs Interventions:   01/10/22- Spoke with pt's niece/HCPOA, [name], who reports she has gotten pt placed at [facility] in [place]. He is settling in and will have a stent placed per niece.  CSW signing off- niece appreciative of help and support. Please re-consult if needs arise.  CSW spoke with pt's niece/HCPOA, [name], who shared that pt is currently in SNF rehab at [facility]. She does not feel he can live independently and is hoping for long term placement- perhaps ALF level of care.  CSW suggested she follow up with the SNF staff to alert them to the needs and that he cannot live independently or return to home at dc. Pt has Medicaid as a secondary payor so hopefully can secure a Medicaid bed. CSW suggested to niece that she continue to search for ALF's she likes and is interested in as well as to check with the particular  facilities to see about bed availability,etc.  CSW has also placed a call to the SNF staff and await call back.   Assessment of needs, barriers , as well as how impacting  Review various resources and discussed options  ( ALF, SNF) Collaboration with PCP regarding development and update of comprehensive plan of care as evidenced by provider attestation and co-signature Inter-disciplinary care team collaboration (see longitudinal plan of care) Patient interviewed and appropriate assessments performed Discussed plans with patient for ongoing care management follow up and provided patient with direct contact information for care management team Advised patient's niece to call/visit ALF's she is interested in for bed availability and to discuss long term care needs with SNF staff asap to ensure an appropriate bed can be secured when SNF stay ends. Assisted patient/caregiver with obtaining information about health plan benefits Provided education to patient/caregiver regarding level of care options.  Other interventions provided: Mindfulness or Relaxation training provided Emotional Support Provided Caregiver stress acknowledged  Patient self-care activities: Over the next 20 days   Follow Up Plan:  None planned      Follow Up  Plan:  no further needs at this time      . Eduard Clos MSW, LCSW Licensed Holiday representative [employer]M    [phone]

## 2022-01-10 NOTE — Patient Instructions (Signed)
Visit Information  Thank you for taking time to visit with me today. Please don't hesitate to contact me if I can be of assistance to you    Please call the care guide team at [phone] if you need to cancel or reschedule your appointment.   If you are experiencing a Mental Health or [place] or need someone to talk to, please call 911   Patient verbalizes understanding of instructions and care plan provided today and agrees to view in [place]. Active MyChart status confirmed with patient.    Eduard Clos MSW, LCSW Licensed Holiday representative [employer]M    [phone]

## 2022-01-11 ENCOUNTER — Encounter (HOSPITAL_COMMUNITY): Payer: Self-pay | Admitting: Certified Registered"

## 2022-01-11 ENCOUNTER — Ambulatory Visit (HOSPITAL_COMMUNITY)
Admission: RE | Admit: 2022-01-11 | Discharge: 2022-01-11 | Disposition: A | Discharge status: Skilled Nursing Facility | Payer: Medicare Other | Attending: Urology | Admitting: Urology

## 2022-01-11 ENCOUNTER — Encounter (HOSPITAL_COMMUNITY)
Admission: RE | Disposition: A | Discharge status: Skilled Nursing Facility | Payer: Self-pay | Source: Home / Self Care | Attending: Urology

## 2022-01-11 ENCOUNTER — Encounter (HOSPITAL_COMMUNITY): Payer: Self-pay | Admitting: Urology

## 2022-01-11 ENCOUNTER — Other Ambulatory Visit: Payer: Self-pay | Admitting: Urology

## 2022-01-11 DIAGNOSIS — Z538 Procedure and treatment not carried out for other reasons: Secondary | ICD-10-CM | POA: Diagnosis not present

## 2022-01-11 DIAGNOSIS — N201 Calculus of ureter: Secondary | ICD-10-CM | POA: Insufficient documentation

## 2022-01-11 SURGERY — CYSTOSCOPY, WITH RETROGRADE PYELOGRAM AND URETERAL STENT INSERTION
Anesthesia: General | Laterality: Right

## 2022-01-11 MED ORDER — LIDOCAINE HCL (PF) 2 % IJ SOLN
INTRAMUSCULAR | Status: AC
  Filled 2022-01-11: qty 10

## 2022-01-11 MED ORDER — PROPOFOL 10 MG/ML IV BOLUS
INTRAVENOUS | Status: AC
  Filled 2022-01-11: qty 20

## 2022-01-11 MED ORDER — KETOROLAC TROMETHAMINE 30 MG/ML IJ SOLN
INTRAMUSCULAR | Status: AC
  Filled 2022-01-11: qty 1

## 2022-01-11 MED ORDER — SODIUM CHLORIDE 0.9 % IV SOLN: 1.0000 g | INTRAVENOUS | Status: DC

## 2022-01-11 MED ORDER — OXYCODONE-ACETAMINOPHEN 5-325 MG PO TABS
1.0000 | ORAL_TABLET | ORAL | 0 refills | Status: DC | PRN
Start: 2022-01-11 — End: 2022-02-01

## 2022-01-11 MED ORDER — ONDANSETRON HCL 4 MG/2ML IJ SOLN
INTRAMUSCULAR | Status: AC
  Filled 2022-01-11: qty 2

## 2022-01-11 MED ORDER — DIATRIZOATE MEGLUMINE 30 % UR SOLN
URETHRAL | Status: AC
  Filled 2022-01-11: qty 100

## 2022-01-11 MED ORDER — FENTANYL CITRATE (PF) 100 MCG/2ML IJ SOLN
INTRAMUSCULAR | Status: AC
  Filled 2022-01-11: qty 2

## 2022-01-11 NOTE — Progress Notes (Signed)
Pt. Admitted to pre-op. Pt stated that he ate eggs this morning.  Called [facility] in [place] to verify.  Nurse verified with staff that he ate eggs between 0730-0800 this morning.  Dr. Adalberto Ill aware.  MD cancelled procedure.  Dr. Alyson Ingles aware.  MD in to speak with patient.  Procedure to be rescheduled to next Thursday 01/18/22.  Called & spoke to [place], nurse at [facility] made aware that procedure was cancelled & to be rescheduled. POA [employer] (niece) also made aware of situation & verbalized understanding.

## 2022-01-15 NOTE — Progress Notes (Signed)
Reviewed surgery posting sheet. Verified with Dr. Alyson Ingles IV antibiotics needed.  Per Dr. Alyson Ingles patient will get 2gm rocephin not 1gm as stated on posting sheet.

## 2022-01-16 DIAGNOSIS — M869 Osteomyelitis, unspecified: Secondary | ICD-10-CM

## 2022-01-16 DIAGNOSIS — E11621 Type 2 diabetes mellitus with foot ulcer: Secondary | ICD-10-CM

## 2022-01-16 DIAGNOSIS — J441 Chronic obstructive pulmonary disease with (acute) exacerbation: Secondary | ICD-10-CM

## 2022-01-16 DIAGNOSIS — E1169 Type 2 diabetes mellitus with other specified complication: Secondary | ICD-10-CM

## 2022-01-16 DIAGNOSIS — L97509 Non-pressure chronic ulcer of other part of unspecified foot with unspecified severity: Secondary | ICD-10-CM

## 2022-01-19 ENCOUNTER — Encounter: Payer: Commercial Managed Care - HMO | Admitting: Urology

## 2022-01-20 ENCOUNTER — Other Ambulatory Visit: Payer: Self-pay | Admitting: Family Medicine

## 2022-01-23 ENCOUNTER — Other Ambulatory Visit: Payer: Self-pay

## 2022-01-24 ENCOUNTER — Telehealth: Payer: Self-pay

## 2022-01-24 NOTE — Telephone Encounter (Signed)
Patient's niece called and wanted to know if pain medication since patient is in pain with Stent.  Patient is a permanent resident of [facility] in [place] and they advised to please send to the pharmacy below.     Pharmacy: [facility] set to deliver medications to facility once called in

## 2022-01-25 ENCOUNTER — Ambulatory Visit (HOSPITAL_COMMUNITY): Payer: Medicare Other

## 2022-01-25 NOTE — Telephone Encounter (Signed)
See below

## 2022-01-26 ENCOUNTER — Ambulatory Visit: Payer: Commercial Managed Care - HMO | Admitting: Urology

## 2022-01-29 ENCOUNTER — Ambulatory Visit (INDEPENDENT_AMBULATORY_CARE_PROVIDER_SITE_OTHER): Payer: Medicare Other | Admitting: Gastroenterology

## 2022-01-29 ENCOUNTER — Encounter (HOSPITAL_COMMUNITY)
Admission: RE | Admit: 2022-01-29 | Discharge: 2022-01-29 | Disposition: A | Discharge status: Home / Self Care | Payer: 59 | Source: Ambulatory Visit | Attending: Urology | Admitting: Urology

## 2022-01-30 ENCOUNTER — Encounter: Payer: Self-pay | Admitting: Podiatry

## 2022-01-30 ENCOUNTER — Encounter: Payer: Self-pay | Admitting: Urology

## 2022-01-30 ENCOUNTER — Encounter: Payer: Self-pay | Admitting: Family Medicine

## 2022-01-30 NOTE — Telephone Encounter (Signed)
Please forward to schedulers

## 2022-01-30 NOTE — Telephone Encounter (Signed)
Please notate for records

## 2022-01-30 NOTE — Telephone Encounter (Signed)
FYI

## 2022-01-31 ENCOUNTER — Ambulatory Visit: Payer: Commercial Managed Care - HMO | Admitting: Vascular Surgery

## 2022-01-31 ENCOUNTER — Other Ambulatory Visit (HOSPITAL_COMMUNITY): Payer: Commercial Managed Care - HMO

## 2022-01-31 ENCOUNTER — Encounter (HOSPITAL_COMMUNITY): Payer: Commercial Managed Care - HMO

## 2022-02-01 ENCOUNTER — Encounter (HOSPITAL_COMMUNITY): Payer: Self-pay | Admitting: Urology

## 2022-02-01 ENCOUNTER — Ambulatory Visit (HOSPITAL_COMMUNITY)
Admission: RE | Admit: 2022-02-01 | Discharge: 2022-02-01 | Disposition: A | Discharge status: Skilled Nursing Facility | Attending: Urology | Admitting: Urology

## 2022-02-01 ENCOUNTER — Other Ambulatory Visit: Payer: Self-pay | Admitting: Urology

## 2022-02-01 ENCOUNTER — Ambulatory Visit (HOSPITAL_BASED_OUTPATIENT_CLINIC_OR_DEPARTMENT_OTHER): Admitting: Anesthesiology

## 2022-02-01 ENCOUNTER — Ambulatory Visit (HOSPITAL_COMMUNITY)

## 2022-02-01 ENCOUNTER — Encounter (HOSPITAL_COMMUNITY)
Admission: RE | Disposition: A | Discharge status: Skilled Nursing Facility | Payer: Self-pay | Source: Home / Self Care | Attending: Urology

## 2022-02-01 ENCOUNTER — Ambulatory Visit (HOSPITAL_COMMUNITY): Admitting: Anesthesiology

## 2022-02-01 DIAGNOSIS — I10 Essential (primary) hypertension: Secondary | ICD-10-CM | POA: Diagnosis not present

## 2022-02-01 DIAGNOSIS — E119 Type 2 diabetes mellitus without complications: Secondary | ICD-10-CM | POA: Diagnosis not present

## 2022-02-01 DIAGNOSIS — N201 Calculus of ureter: Secondary | ICD-10-CM | POA: Insufficient documentation

## 2022-02-01 DIAGNOSIS — Z87440 Personal history of urinary (tract) infections: Secondary | ICD-10-CM | POA: Insufficient documentation

## 2022-02-01 DIAGNOSIS — Z7984 Long term (current) use of oral hypoglycemic drugs: Secondary | ICD-10-CM | POA: Diagnosis not present

## 2022-02-01 DIAGNOSIS — Z87442 Personal history of urinary calculi: Secondary | ICD-10-CM | POA: Insufficient documentation

## 2022-02-01 DIAGNOSIS — J449 Chronic obstructive pulmonary disease, unspecified: Secondary | ICD-10-CM | POA: Diagnosis not present

## 2022-02-01 DIAGNOSIS — Z87891 Personal history of nicotine dependence: Secondary | ICD-10-CM | POA: Insufficient documentation

## 2022-02-01 DIAGNOSIS — E1151 Type 2 diabetes mellitus with diabetic peripheral angiopathy without gangrene: Secondary | ICD-10-CM

## 2022-02-01 DIAGNOSIS — Z7902 Long term (current) use of antithrombotics/antiplatelets: Secondary | ICD-10-CM | POA: Insufficient documentation

## 2022-02-01 DIAGNOSIS — K219 Gastro-esophageal reflux disease without esophagitis: Secondary | ICD-10-CM | POA: Diagnosis not present

## 2022-02-01 DIAGNOSIS — N1330 Unspecified hydronephrosis: Secondary | ICD-10-CM | POA: Diagnosis not present

## 2022-02-01 HISTORY — PX: HOLMIUM LASER APPLICATION: SHX5852

## 2022-02-01 HISTORY — PX: CYSTOSCOPY WITH RETROGRADE PYELOGRAM, URETEROSCOPY AND STENT PLACEMENT: SHX5789

## 2022-02-01 LAB — GLUCOSE, CAPILLARY: Glucose-Capillary: 88 mg/dL (ref 70–99)

## 2022-02-01 SURGERY — CYSTOURETEROSCOPY, WITH RETROGRADE PYELOGRAM AND STENT INSERTION
Anesthesia: General | Site: Ureter | Laterality: Right

## 2022-02-01 MED ORDER — ONDANSETRON HCL 4 MG/2ML IJ SOLN: 4.0000 mg | Freq: Once | INTRAMUSCULAR | Status: DC | PRN

## 2022-02-01 MED ORDER — PHENYLEPHRINE HCL (PRESSORS) 10 MG/ML IV SOLN
INTRAVENOUS | Status: DC | PRN
  Administered 2022-02-01 (×3): 120 ug via INTRAVENOUS
  Administered 2022-02-01: 160 ug via INTRAVENOUS
  Administered 2022-02-01: 120 ug via INTRAVENOUS
  Administered 2022-02-01: 160 ug via INTRAVENOUS

## 2022-02-01 MED ORDER — FENTANYL CITRATE PF 50 MCG/ML IJ SOSY: 25.0000 ug | PREFILLED_SYRINGE | INTRAMUSCULAR | Status: DC | PRN

## 2022-02-01 MED ORDER — FENTANYL CITRATE (PF) 100 MCG/2ML IJ SOLN
INTRAMUSCULAR | Status: DC | PRN
  Administered 2022-02-01: 100 ug via INTRAVENOUS
  Administered 2022-02-01 (×2): 50 ug via INTRAVENOUS

## 2022-02-01 MED ORDER — EPHEDRINE SULFATE (PRESSORS) 50 MG/ML IJ SOLN
INTRAMUSCULAR | Status: DC | PRN
Start: 2022-02-01 — End: 2022-02-01
  Administered 2022-02-01 (×2): 5 mg via INTRAVENOUS

## 2022-02-01 MED ORDER — LACTATED RINGERS IV SOLN: INTRAVENOUS | Status: DC

## 2022-02-01 MED ORDER — EPHEDRINE 5 MG/ML INJ
INTRAVENOUS | Status: AC
  Filled 2022-02-01: qty 5

## 2022-02-01 MED ORDER — SODIUM CHLORIDE 0.9 % IR SOLN
Status: DC | PRN
  Administered 2022-02-01 (×2): 3000 mL

## 2022-02-01 MED ORDER — OXYCODONE-ACETAMINOPHEN 5-325 MG PO TABS: 1.0000 | ORAL_TABLET | ORAL | 0 refills | Status: DC | PRN

## 2022-02-01 MED ORDER — LIDOCAINE HCL (CARDIAC) PF 100 MG/5ML IV SOSY
PREFILLED_SYRINGE | INTRAVENOUS | Status: DC | PRN
Start: 2022-02-01 — End: 2022-02-01
  Administered 2022-02-01: 50 mg via INTRAVENOUS

## 2022-02-01 MED ORDER — PHENYLEPHRINE 40 MCG/ML (10ML) SYRINGE FOR IV PUSH (FOR BLOOD PRESSURE SUPPORT)
PREFILLED_SYRINGE | INTRAVENOUS | Status: AC
  Filled 2022-02-01: qty 20

## 2022-02-01 MED ORDER — ONDANSETRON HCL 4 MG/2ML IJ SOLN
INTRAMUSCULAR | Status: DC | PRN
  Administered 2022-02-01: 4 mg via INTRAVENOUS

## 2022-02-01 MED ORDER — PROPOFOL 10 MG/ML IV BOLUS
INTRAVENOUS | Status: DC | PRN
  Administered 2022-02-01: 100 mg via INTRAVENOUS

## 2022-02-01 MED ORDER — DIATRIZOATE MEGLUMINE 30 % UR SOLN
URETHRAL | Status: AC
  Filled 2022-02-01: qty 100

## 2022-02-01 MED ORDER — PROPOFOL 10 MG/ML IV BOLUS
INTRAVENOUS | Status: AC
  Filled 2022-02-01: qty 20

## 2022-02-01 MED ORDER — FENTANYL CITRATE (PF) 100 MCG/2ML IJ SOLN
INTRAMUSCULAR | Status: AC
  Filled 2022-02-01: qty 2

## 2022-02-01 MED ORDER — WATER FOR IRRIGATION, STERILE IR SOLN
Status: DC | PRN
  Administered 2022-02-01: 500 mL

## 2022-02-01 MED ORDER — SODIUM CHLORIDE 0.9 % IV SOLN
2.0000 g | INTRAVENOUS | Status: AC
  Administered 2022-02-01: 2 g via INTRAVENOUS
  Filled 2022-02-01: qty 20

## 2022-02-01 MED ORDER — CHLORHEXIDINE GLUCONATE 0.12 % MT SOLN: 15.0000 mL | Freq: Once | OROMUCOSAL | Status: DC

## 2022-02-01 MED ORDER — LIDOCAINE HCL (PF) 2 % IJ SOLN
INTRAMUSCULAR | Status: AC
  Filled 2022-02-01: qty 5

## 2022-02-01 MED ORDER — ONDANSETRON HCL 4 MG/2ML IJ SOLN
INTRAMUSCULAR | Status: AC
  Filled 2022-02-01: qty 2

## 2022-02-01 MED ORDER — DIATRIZOATE MEGLUMINE 30 % UR SOLN
URETHRAL | Status: DC | PRN
  Administered 2022-02-01: 8 mL via URETHRAL

## 2022-02-01 MED ORDER — ORAL CARE MOUTH RINSE: 15.0000 mL | Freq: Once | OROMUCOSAL | Status: DC

## 2022-02-01 MED ORDER — LACTATED RINGERS IV SOLN: INTRAVENOUS | Status: DC | PRN

## 2022-02-01 SURGICAL SUPPLY — 23 items
BAG DRAIN URO TABLE W/ADPT NS (BAG) ×2 IMPLANT
CATH INTERMIT  6FR 70CM (CATHETERS) ×2 IMPLANT
CLOTH BEACON ORANGE TIMEOUT ST (SAFETY) ×2 IMPLANT
EXTRACTOR STONE NITINOL NGAGE (UROLOGICAL SUPPLIES) ×1 IMPLANT
GLOVE SURG POLYISO LF SZ8 (GLOVE) ×2 IMPLANT
GLOVE SURG UNDER POLY LF SZ7 (GLOVE) ×4 IMPLANT
GOWN STRL REUS W/TWL LRG LVL3 (GOWN DISPOSABLE) ×2 IMPLANT
GOWN STRL REUS W/TWL XL LVL3 (GOWN DISPOSABLE) ×2 IMPLANT
GUIDEWIRE STR DUAL SENSOR (WIRE) ×2 IMPLANT
GUIDEWIRE STR ZIPWIRE 035X150 (MISCELLANEOUS) ×2 IMPLANT
IV NS IRRIG 3000ML ARTHROMATIC (IV SOLUTION) ×4 IMPLANT
KIT TURNOVER CYSTO (KITS) ×2 IMPLANT
MANIFOLD NEPTUNE II (INSTRUMENTS) ×2 IMPLANT
PACK CYSTO (CUSTOM PROCEDURE TRAY) ×2 IMPLANT
PAD ARMBOARD 7.5X6 YLW CONV (MISCELLANEOUS) ×2 IMPLANT
SHEATH URETERAL 12FRX35CM (MISCELLANEOUS) ×1 IMPLANT
STENT URET 6FRX26 CONTOUR (STENTS) ×1 IMPLANT
SYR 10ML LL (SYRINGE) ×2 IMPLANT
SYR CONTROL 10ML LL (SYRINGE) ×2 IMPLANT
TOWEL OR 17X26 4PK STRL BLUE (TOWEL DISPOSABLE) ×2 IMPLANT
TRACTIP FLEXIVA PULS ID 200XHI (Laser) IMPLANT
TRACTIP FLEXIVA PULSE ID 200 (Laser) ×2
WATER STERILE IRR 500ML POUR (IV SOLUTION) ×2 IMPLANT

## 2022-02-01 NOTE — Transfer of Care (Signed)
Immediate Anesthesia Transfer of Care Note  Patient: [name]  Procedure(s) Performed: CYSTOSCOPY WITH RETROGRADE PYELOGRAM, URETEROSCOPY AND STENT PLACEMENT (Right: Ureter) HOLMIUM LASER APPLICATION (Right: Ureter)  Patient Location: PACU  Anesthesia Type:General  Level of Consciousness: awake, alert , oriented and patient cooperative  Airway & Oxygen Therapy: Patient Spontanous Breathing and Patient connected to nasal cannula oxygen  Post-op Assessment: Report given to RN, Post -op Vital signs reviewed and stable and Patient moving all extremities X 4  Post vital signs: Reviewed and stable  Last Vitals:  Vitals Value Taken Time  BP 137/98 02/01/22 0851  Temp    Pulse 93 02/01/22 0851  Resp 15 02/01/22 0853  SpO2 97 % 02/01/22 0851  Vitals shown include unvalidated device data.  Last Pain:  Vitals:   02/01/22 0700  TempSrc: Oral  PainSc: 0-No pain      Patients Stated Pain Goal: 6 (32/67/12 4580)  Complications: No notable events documented.

## 2022-02-01 NOTE — Op Note (Signed)
Preoperative diagnosis: Right ureteral stone  Postoperative diagnosis: Same  Procedure: 1 cystoscopy 2 right retrograde pyelography 3.  Intraoperative fluoroscopy, under one hour, with interpretation 4.  Right ureteroscopic stone manipulation with laser lithotripsy 5.  Right 6 x 26 JJ stent placement  Attending: Rosie Fate  Anesthesia: General  Estimated blood loss: None  Drains: Right 6 x 26 JJ ureteral stent without tether  Specimens: stone for analysis  Antibiotics: ancef  Findings: right impacted 1.2cm distal ureteral calculus. Moderate proximal hydronephrosis  Indications: Patient is a 71 year old male with a history of a right ureteral stone and who underwent stent placement for sepsis over 1 month ago.  After discussing treatment options, he decided proceed with right ureteroscopic stone manipulation.  Procedure in detail: The patient was brought to the operating room and a brief timeout was done to ensure correct patient, correct procedure, correct site.  General anesthesia was administered patient was placed in dorsal lithotomy position.  Her genitalia was then prepped and draped in usual sterile fashion.  A rigid 4 French cystoscope was passed in the urethra and the bladder.  Bladder was inspected free masses or lesions.  the right ureteral orifices were in the normal orthotopic locations.  a 6 french ureteral catheter was then instilled into the right ureter orifice.  a gentle retrograde was obtained and findings noted above. Using a grasper the right ureteral stent was brought to the urethral meatus. we then placed a zip wire through the ureteral stent and advanced up to the renal pelvis. We then removed the stent. we then removed the cystoscope and cannulated the right ureteral orifice with a semirigid ureteroscope.  we then encountered the stone in the distal ureter.  using a 242 nm laser fiber and fragmented the stone into smaller pieces.  the pieces were then removed  with a Ngage basket. Once all stone fragments were removed we then placed a 6 x 26 double-j ureteral stent over the original zip wire. We then removed the wire and good coil was noted in the the renal pelvis under fluoroscopy and the bladder under direct vision.   the stone fragments were then removed from the bladder and sent for analysis.   the bladder was then drained and this concluded the procedure which was well tolerated by patient.  Complications: None  Condition: Stable, extubated, transferred to PACU  Plan: Patient is to be discharged home as to follow-up in one week for stent removal.

## 2022-02-01 NOTE — H&P (Signed)
Urology Admission H&P  Chief Complaint: right ureteral calculus  History of Present Illness: Mr. Segal is a 71 year old, who currently resides at [facility] re[facility] in [place], presents with a history of nephrolithiasis who presents for right ureteral stent removal. HX: The patient was seen in the ED at [facility] hospital on 12/6 and noted to have UTI and 1cm right distal ureteral stone. Dr. Alyson Ingles consulted for stent placement and tx of obstructing stones. The pt requested nephrostomy tube rather than ureteral stent and was transferred to [facility].  Following transfer and further evaluation, the patient underwent right ureteral stent placement without tether with Dr. Diona Fanti on 11/22/2021. He was discharged from [facility] on 11/23/2021 on PO Keflex.  Patient returned to the emergency department on 12/05/2021 with altered mental status and was admitted for urinary sepsis.  He was discharged on 12/12/2021 following treatment with IV meropenem.  Discharged on Cipro.  Today, the patient complains of lower abdominal and bilateral flank pain for approximately 1 week.  Urinalysis at SNF on 1/12 indicated infection.  Culture revealed vancomycin resistant Enterococcus faecalis with resistance to quinolones as well. He currently denies fever, nausea or vomiting, hematuria.  Patient's medication list indicates he is on no antibiotics at this time.  He is on Plavix.   PVR=54ml UA=11-30 WBC, >30RBC, few bacteria, culture ordered   11/21/21 Urine culture-No growth   12/05/21 >100K  Pseudomonas aeruginosa 10,000 colonies of Enterococcus faecium, Vanc and nitrofuantoin restistant   12/29/21 >100K colonies of Enterococcus faecalis, vancomycin and Quinolone resistant       11/23/21 Mr Crago is a 71yo with a history of nephrolithiasis who presented to [facility] ER 12/6 with a 4 day history of diffuse abdominal pain, inability to urinate with nausea and vomiting. His initial work up there was notable for  lactate 2.9, WBC count 14.2, Cr 0.9 and UA with moderate leukocytes, negative nitrities, pyruria and rare bacteria. CT A/P shows collection of right distal ureteral stones up to ~1cm. He was seen by Dr. Alyson Ingles at [facility] with plans to transfer to [facility] for possible R nephrostomy tube placement. Following consult with IR, pt was taken to the OR for right ureteral stent.   11/21/21 Mr Agosto is a 71yo with a history of nephrolithiasis who presented to [facility] ER with a 4 day history of diffuse abdominal pain, inability to urinate with nausea and vomiting.  The abdominal pain is dull constant mild to moderate and nonraditing. Lactate 2.9. WBC count 14.2. CT shows 1cm right distal ureteral calculus. Creatinine on admission 1.29 with baseline of 0.8. Pt transferred to [facility] for nephrostomy tube placement  Past Medical History:  Diagnosis Date   Caregiver with fatigue 05/02/2020   COPD (chronic obstructive pulmonary disease) (HCC)    Deformity    equinus and metatarsal   Erectile dysfunction    Erectile dysfunction 05/02/2020   Full dentures    History of kidney stones    History of seizures 05/31/2020   Between ages 18 and 47   Hypertension    Impaired glucose tolerance    Pancreatitis, recurrent    Pneumonia    Thrombocytopenia ([place]) 08/10/2018   Staying in the low 100s will follow closely   Past Surgical History:  Procedure Laterality Date   ABDOMINAL AORTOGRAM W/LOWER EXTREMITY Bilateral 08/20/2019   Procedure: ABDOMINAL AORTOGRAM W/LOWER EXTREMITY;  Surgeon: Marty Heck, MD;  Location: [place] [facility];  Service: Cardiovascular;  Laterality: Bilateral;   ACHILLES  TENDON SURGERY Right 08/26/2020   Procedure: ACHILLES TENDON LENGTHENING;  Surgeon: Criselda Peaches, DPM;  Location: [place];  Service: Podiatry;  Laterality: Right;   AMPUTATION Right 09/15/2019   Procedure: AMPUTATION RIGHT TOES One, Two, And Three;  Surgeon: Waynetta Sandy, MD;  Location: [place];   Service: Vascular;  Laterality: Right;   APPENDECTOMY     CATARACT EXTRACTION W/PHACO Right 05/02/2018   Procedure: CATARACT EXTRACTION PHACO AND INTRAOCULAR LENS PLACEMENT ([place]);  Surgeon: Baruch Goldmann, MD;  Location: [facility];  Service: Ophthalmology;  Laterality: Right;  CDE: 11.11   CATARACT EXTRACTION W/PHACO Left 07/04/2018   Procedure: CATARACT EXTRACTION PHACO AND INTRAOCULAR LENS PLACEMENT ([facility]);  Surgeon: Baruch Goldmann, MD;  Location: [facility];  Service: Ophthalmology;  Laterality: Left;  CDE: 7.15   CHOLECYSTECTOMY     CYSTOSCOPY W/ URETERAL STENT PLACEMENT Right 11/22/2021   Procedure: CYSTOSCOPY WITH RETROGRADE PYELOGRAM/URETERAL STENT PLACEMENT;  Surgeon: Franchot Gallo, MD;  Location: [facility];  Service: Urology;  Laterality: Right;   FEMORAL-POPLITEAL BYPASS GRAFT Right 09/15/2019   Procedure: Right BYPASS GRAFT FEMORAL to Above Knee POPLITEAL ARTERY;  Surgeon: Waynetta Sandy, MD;  Location: [place];  Service: Vascular;  Laterality: Right;   FEMORAL-POPLITEAL BYPASS GRAFT Right 01/26/2020   Procedure: IRRIGATION AND DEBRIDEMENT RIGHT FEMORAL POPLITEAL BYPASS SITE;  Surgeon: Waynetta Sandy, MD;  Location: [place];  Service: Vascular;  Laterality: Right;   MULTIPLE TOOTH EXTRACTIONS     ORCHIECTOMY     PERIPHERAL VASCULAR INTERVENTION Left 08/20/2019   Procedure: PERIPHERAL VASCULAR INTERVENTION;  Surgeon: Marty Heck, MD;  Location: [place] [facility];  Service: Cardiovascular;  Laterality: Left;  common/external iliac   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   Procedure: TRANSMETATARSAL AMPUTATION;  Surgeon: Waynetta Sandy, MD;  Location: [place];  Service: Vascular;  Laterality: Right;   TRANSMETATARSAL AMPUTATION Right 08/26/2020   Procedure: TRANSMETATARSAL AMPUTATION REVISION;  Surgeon: Criselda Peaches, DPM;  Location: [place];  Service: Podiatry;  Laterality: Right;   VASECTOMY     WOUND DEBRIDEMENT Right 08/26/2020   Procedure: EXCISION WOUND;   Surgeon: Criselda Peaches, DPM;  Location: [place];  Service: Podiatry;  Laterality: Right;    Home Medications:  Current Facility-Administered Medications  Medication Dose Route Frequency Provider Last Rate Last Admin   cefTRIAXone (ROCEPHIN) 2 g in sodium chloride 0.9 % 100 mL IVPB  2 g Intravenous 30 min Pre-Op Zhane Donlan, Candee Furbish, MD       Allergies: No Known Allergies  Family History  Problem Relation Age of Onset   Hypertension Father    Coronary artery disease Father    Coronary artery disease Mother    Cancer Mother        Breast   Social History:  reports that he has quit smoking. His smoking use included cigarettes. He has a 30.00 pack-year smoking history. He has quit using smokeless tobacco.  His smokeless tobacco use included chew. He reports that he does not drink alcohol and does not use drugs.  Review of Systems  Genitourinary:  Positive for flank pain.  All other systems reviewed and are negative.  Physical Exam:  Vital signs in last 24 hours: Temp:  [98.5 F (36.9 C)] 98.5 F (36.9 C) (02/16 0700) Resp:  [18] 18 (02/16 0700) BP: (164)/(100) 164/100 (02/16 0700) Weight:  [64.8 kg] 64.8 kg (02/16 0700) Physical Exam Vitals reviewed.  Constitutional:      Appearance: Normal appearance.  HENT:     Head:  Normocephalic and atraumatic.     Nose: Nose normal. No congestion.     Mouth/Throat:     Mouth: Mucous membranes are dry.  Eyes:     Extraocular Movements: Extraocular movements intact.     Pupils: Pupils are equal, round, and reactive to light.  Cardiovascular:     Rate and Rhythm: Normal rate and regular rhythm.  Pulmonary:     Effort: Pulmonary effort is normal. No respiratory distress.  Abdominal:     General: Abdomen is flat. There is no distension.  Musculoskeletal:        General: No swelling. Normal range of motion.     Cervical back: Normal range of motion and neck supple.  Skin:    General: Skin is warm and dry.  Neurological:      General: No focal deficit present.     Mental Status: He is alert and oriented to person, place, and time.  Psychiatric:        Mood and Affect: Mood normal.        Behavior: Behavior normal.        Thought Content: Thought content normal.        Judgment: Judgment normal.    Laboratory Data:  No results found for this or any previous visit (from the past 24 hour(s)). No results found for this or any previous visit (from the past 240 hour(s)). Creatinine: No results for input(s): CREATININE in the last 168 hours. Baseline Creatinine: unknwon  Impression/Assessment:  70yo with right ureteral calculus  Plan:  -We discussed the management of kidney stones. These options include observation, ureteroscopy, shockwave lithotripsy (ESWL) and percutaneous nephrolithotomy (PCNL). We discussed which options are relevant to the patient's stone(s). We discussed the natural history of kidney stones as well as the complications of untreated stones and the impact on quality of life without treatment as well as with each of the above listed treatments. We also discussed the efficacy of each treatment in its ability to clear the stone burden. With any of these management options I discussed the signs and symptoms of infection and the need for emergent treatment should these be experienced. For each option we discussed the ability of each procedure to clear the patient of their stone burden.   For observation I described the risks which include but are not limited to silent renal damage, life-threatening infection, need for emergent surgery, failure to pass stone and pain.   For ureteroscopy I described the risks which include bleeding, infection, damage to contiguous structures, positioning injury, ureteral stricture, ureteral avulsion, ureteral injury, need for prolonged ureteral stent, inability to perform ureteroscopy, need for an interval procedure, inability to clear stone burden, stent discomfort/pain,  heart attack, stroke, pulmonary embolus and the inherent risks with general anesthesia.   For shockwave lithotripsy I described the risks which include arrhythmia, kidney contusion, kidney hemorrhage, need for transfusion, pain, inability to adequately break up stone, inability to pass stone fragments, Steinstrasse, infection associated with obstructing stones, need for alternate surgical procedure, need for repeat shockwave lithotripsy, MI, CVA, PE and the inherent risks with anesthesia/conscious sedation.   For PCNL I described the risks including positioning injury, pneumothorax, hydrothorax, need for chest tube, inability to clear stone burden, renal laceration, arterial venous fistula or malformation, need for embolization of kidney, loss of kidney or renal function, need for repeat procedure, need for prolonged nephrostomy tube, ureteral avulsion, MI, CVA, PE and the inherent risks of general anesthesia.   - The patient would like  to proceed with Right ureteroscopic stone extraction  Nicolette Bang 02/01/2022, 7:36 AM

## 2022-02-01 NOTE — Anesthesia Procedure Notes (Signed)
Procedure Name: LMA Insertion Date/Time: 02/01/2022 8:00 AM Performed by: Jonna Munro, CRNA Pre-anesthesia Checklist: Patient identified, Emergency Drugs available, Suction available, Patient being monitored and Timeout performed Patient Re-evaluated:Patient Re-evaluated prior to induction Oxygen Delivery Method: Circle system utilized Preoxygenation: Pre-oxygenation with 100% oxygen Induction Type: IV induction LMA: LMA inserted LMA Size: 4.0 Number of attempts: 1 Placement Confirmation: positive ETCO2 and breath sounds checked- equal and bilateral Tube secured with: Tape Dental Injury: Teeth and Oropharynx as per pre-operative assessment

## 2022-02-01 NOTE — Anesthesia Preprocedure Evaluation (Addendum)
Anesthesia Evaluation  Patient identified by MRN, date of birth, ID band Patient awake    Reviewed: Allergy & Precautions, H&P , NPO status , Patient's Chart, lab work & pertinent test results, reviewed documented beta blocker date and time   Airway Mallampati: II  TM Distance: >3 FB Neck ROM: full    Dental no notable dental hx. (+) Edentulous Upper, Edentulous Lower   Pulmonary COPD, Patient abstained from smoking., former smoker,    Pulmonary exam normal breath sounds clear to auscultation       Cardiovascular Exercise Tolerance: Good hypertension, + Peripheral Vascular Disease   Rhythm:regular Rate:Normal     Neuro/Psych PSYCHIATRIC DISORDERS negative neurological ROS     GI/Hepatic Neg liver ROS, GERD  Medicated,  Endo/Other  diabetes, Well Controlled, Type 2  Renal/GU Renal disease  negative genitourinary   Musculoskeletal   Abdominal   Peds  Hematology negative hematology ROS (+)   Anesthesia Other Findings   Reproductive/Obstetrics negative OB ROS                            Anesthesia Physical Anesthesia Plan  ASA: 3  Anesthesia Plan: General and General LMA   Post-op Pain Management:    Induction:   PONV Risk Score and Plan: Ondansetron  Airway Management Planned:   Additional Equipment:   Intra-op Plan:   Post-operative Plan:   Informed Consent: I have reviewed the patients History and Physical, chart, labs and discussed the procedure including the risks, benefits and alternatives for the proposed anesthesia with the patient or authorized representative who has indicated his/her understanding and acceptance.     Dental Advisory Given  Plan Discussed with: CRNA  Anesthesia Plan Comments:        Anesthesia Quick Evaluation

## 2022-02-01 NOTE — Progress Notes (Addendum)
This RN discharge report to Nord, RN at [facility] at this time

## 2022-02-01 NOTE — Anesthesia Postprocedure Evaluation (Signed)
Anesthesia Post Note  Patient: Raymond Walker.  Procedure(s) Performed: CYSTOSCOPY WITH RETROGRADE PYELOGRAM, URETEROSCOPY AND STENT PLACEMENT (Right: Ureter) HOLMIUM LASER APPLICATION (Right: Ureter)  Patient location during evaluation: Phase II Anesthesia Type: General Level of consciousness: awake Pain management: pain level controlled Vital Signs Assessment: post-procedure vital signs reviewed and stable Respiratory status: spontaneous breathing and respiratory function stable Cardiovascular status: blood pressure returned to baseline and stable Postop Assessment: no headache and no apparent nausea or vomiting Anesthetic complications: no Comments: Late entry   No notable events documented.   Last Vitals:  Vitals:   02/01/22 0915 02/01/22 0925  BP: (!) 143/81 115/90  Pulse: 78 78  Resp: 13 16  Temp:  36.5 C  SpO2: 97% 98%    Last Pain:  Vitals:   02/01/22 0925  TempSrc: Oral  PainSc: 0-No pain                 Louann Sjogren

## 2022-02-05 ENCOUNTER — Encounter (HOSPITAL_COMMUNITY): Payer: Self-pay | Admitting: Urology

## 2022-02-06 LAB — CALCULI, WITH PHOTOGRAPH (CLINICAL LAB)
Calcium Oxalate Dihydrate: 30 %
Calcium Oxalate Monohydrate: 60 %
Hydroxyapatite: 10 %
Weight Calculi: 154 mg

## 2022-02-07 ENCOUNTER — Encounter: Payer: Medicare Other | Admitting: Urology

## 2022-02-08 NOTE — Telephone Encounter (Signed)
Front Please take note of all of these administrative issues that the family brings up regarding address etc. please update his files accordingly thank you

## 2022-02-14 ENCOUNTER — Ambulatory Visit (INDEPENDENT_AMBULATORY_CARE_PROVIDER_SITE_OTHER): Admitting: Urology

## 2022-02-14 ENCOUNTER — Other Ambulatory Visit: Payer: Self-pay

## 2022-02-14 ENCOUNTER — Encounter: Payer: Self-pay | Admitting: Urology

## 2022-02-14 VITALS — BP 145/90 | HR 90

## 2022-02-14 DIAGNOSIS — N201 Calculus of ureter: Secondary | ICD-10-CM | POA: Diagnosis not present

## 2022-02-14 MED ORDER — CIPROFLOXACIN HCL 500 MG PO TABS: 500.0000 mg | ORAL_TABLET | Freq: Once | ORAL | Status: DC

## 2022-02-14 NOTE — Patient Instructions (Signed)
Dietary Guidelines to Help Prevent Kidney Stones Kidney stones are deposits of minerals and salts that form inside your kidneys. Your risk of developing kidney stones may be greater depending on your diet, your lifestyle, the medicines you take, and whether you have certain medical conditions. Most people can lower their chances of developing kidney stones by following the instructions below. Your dietitian may give you more specific instructions depending on your overall health and the type of kidney stones you tend to develop. What are tips for following this plan? Reading food labels  Choose foods with "no salt added" or "low-salt" labels. Limit your salt (sodium) intake to less than 1,500 mg a day. Choose foods with calcium for each meal and snack. Try to eat about 300 mg of calcium at each meal. Foods that contain 200-500 mg of calcium a serving include: 8 oz (237 mL) of milk, calcium-fortifiednon-dairy milk, and calcium-fortifiedfruit juice. Calcium-fortified means that calcium has been added to these drinks. 8 oz (237 mL) of kefir, yogurt, and soy yogurt. 4 oz (114 g) of tofu. 1 oz (28 g) of cheese. 1 cup (150 g) of dried figs. 1 cup (91 g) of cooked broccoli. One 3 oz (85 g) can of sardines or mackerel. Most people need 1,000-1,500 mg of calcium a day. Talk to your dietitian about how much calcium is recommended for you. Shopping Buy plenty of fresh fruits and vegetables. Most people do not need to avoid fruits and vegetables, even if these foods contain nutrients that may contribute to kidney stones. When shopping for convenience foods, choose: Whole pieces of fruit. Pre-made salads with dressing on the side. Low-fat fruit and yogurt smoothies. Avoid buying frozen meals or prepared deli foods. These can be high in sodium. Look for foods with live cultures, such as yogurt and kefir. Choose high-fiber grains, such as whole-wheat breads, oat bran, and wheat cereals. Cooking Do not add  salt to food when cooking. Place a salt shaker on the table and allow each person to add his or her own salt to taste. Use vegetable protein, such as beans, textured vegetable protein (TVP), or tofu, instead of meat in pasta, casseroles, and soups. Meal planning Eat less salt, if told by your dietitian. To do this: Avoid eating processed or pre-made food. Avoid eating fast food. Eat less animal protein, including cheese, meat, poultry, or fish, if told by your dietitian. To do this: Limit the number of times you have meat, poultry, fish, or cheese each week. Eat a diet free of meat at least 2 days a week. Eat only one serving each day of meat, poultry, fish, or seafood. When you prepare animal protein, cut pieces into small portion sizes. For most meat and fish, one serving is about the size of the palm of your hand. Eat at least five servings of fresh fruits and vegetables each day. To do this: Keep fruits and vegetables on hand for snacks. Eat one piece of fruit or a handful of berries with breakfast. Have a salad and fruit at lunch. Have two kinds of vegetables at dinner. Limit foods that are high in a substance called oxalate. These include: Spinach (cooked), rhubarb, beets, sweet potatoes, and Swiss chard. Peanuts. Potato chips, french fries, and baked potatoes with skin on. Nuts and nut products. Chocolate. If you regularly take a diuretic medicine, make sure to eat at least 1 or 2 servings of fruits or vegetables that are high in potassium each day. These include: Avocado. Banana. Orange, prune,   carrot, or tomato juice. Baked potato. Cabbage. Beans and split peas. Lifestyle  Drink enough fluid to keep your urine pale yellow. This is the most important thing you can do. Spread your fluid intake throughout the day. If you drink alcohol: Limit how much you use to: 0-1 drink a day for women who are not pregnant. 0-2 drinks a day for men. Be aware of how much alcohol is in your  drink. In the [place] one drink equals one 12 oz bottle of beer (355 mL), one 5 oz glass of wine (148 mL), or one 1 oz glass of hard liquor (44 mL). Lose weight if told by your health care provider. Work with your dietitian to find an eating plan and weight loss strategies that work best for you. General information Talk to your health care provider and dietitian about taking daily supplements. You may be told the following depending on your health and the cause of your kidney stones: Not to take supplements with vitamin C. To take a calcium supplement. To take a daily probiotic supplement. To take other supplements such as magnesium, fish oil, or vitamin B6. Take over-the-counter and prescription medicines only as told by your health care provider. These include supplements. What foods should I limit? Limit your intake of the following foods, or eat them as told by your dietitian. Vegetables Spinach. Rhubarb. Beets. Canned vegetables. Pickles. Olives. Baked potatoes with skin. Grains Wheat bran. Baked goods. Salted crackers. Cereals high in sugar. Meats and other proteins Nuts. Nut butters. Large portions of meat, poultry, or fish. Salted, precooked, or cured meats, such as sausages, meat loaves, and hot dogs. Dairy Cheese. Beverages Regular soft drinks. Regular vegetable juice. Seasonings and condiments Seasoning blends with salt. Salad dressings. Soy sauce. Ketchup. Barbecue sauce. Other foods Canned soups. Canned pasta sauce. Casseroles. Pizza. Lasagna. Frozen meals. Potato chips. French fries. The items listed above may not be a complete list of foods and beverages you should limit. Contact a dietitian for more information. What foods should I avoid? Talk to your dietitian about specific foods you should avoid based on the type of kidney stones you have and your overall health. Fruits Grapefruit. The item listed above may not be a complete list of foods and beverages you should  avoid. Contact a dietitian for more information. Summary Kidney stones are deposits of minerals and salts that form inside your kidneys. You can lower your risk of kidney stones by making changes to your diet. The most important thing you can do is drink enough fluid. Drink enough fluid to keep your urine pale yellow. Talk to your dietitian about how much calcium you should have each day, and eat less salt and animal protein as told by your dietitian. This information is not intended to replace advice given to you by your health care provider. Make sure you discuss any questions you have with your health care provider. Document Revised: 11/26/2019 Document Reviewed: 11/26/2019 Elsevier Patient Education  2022 [employer] Inc.  

## 2022-02-14 NOTE — Progress Notes (Signed)
? ?  02/14/22 ? ?CC: followup nephrolithiasis ? ? ?HPI: ?Raymond Walker is a 71yo here for followup for right ureteral stent removal ?There were no vitals taken for this visit. ?NED. A&Ox3.   ?No respiratory distress   ?Abd soft, NT, ND ?Normal phallus with bilateral descended testicles ? ?Cystoscopy Procedure Note ? ?Patient identification was confirmed, informed consent was obtained, and patient was prepped using Betadine solution.  Lidocaine jelly was administered per urethral meatus.   ? ? ?Pre-Procedure: ?- Inspection reveals a normal caliber ureteral meatus. ? ?Procedure: ?The flexible cystoscope was introduced without difficulty ?- No urethral strictures/lesions are present. ?- Enlarged prostate ?- Normal bladder neck ?- Bilateral ureteral orifices identified ?- Bladder mucosa  reveals no ulcers, tumors, or lesions ?- No bladder stones ?- Mild trabeculation ? ? ?Using a grasper the right ureteral stent was removed intact ? ?Post-Procedure: ?- Patient tolerated the procedure well ? ?Assessment/ Plan: ?RTC 6 weeks with a renal US ? ?No follow-ups on file. ? ?Nicolette Bang, MD  ?

## 2022-02-15 LAB — URINALYSIS, ROUTINE W REFLEX MICROSCOPIC
Bilirubin, UA: NEGATIVE
Glucose, UA: NEGATIVE
Ketones, UA: NEGATIVE
Nitrite, UA: NEGATIVE
Specific Gravity, UA: 1.02 (ref 1.005–1.030)
Urobilinogen, Ur: 0.2 mg/dL (ref 0.2–1.0)
pH, UA: 7.5 (ref 5.0–7.5)

## 2022-02-15 LAB — MICROSCOPIC EXAMINATION
Renal Epithel, UA: NONE SEEN /hpf
WBC, UA: >30 /hpf — AB (ref 0–5)

## 2022-02-16 ENCOUNTER — Ambulatory Visit (HOSPITAL_COMMUNITY): Payer: 59

## 2022-03-12 ENCOUNTER — Ambulatory Visit (HOSPITAL_COMMUNITY): Admission: RE | Admit: 2022-03-12 | Payer: 59 | Source: Ambulatory Visit

## 2022-03-20 ENCOUNTER — Ambulatory Visit (HOSPITAL_COMMUNITY): Payer: 59

## 2022-03-22 ENCOUNTER — Ambulatory Visit (INDEPENDENT_AMBULATORY_CARE_PROVIDER_SITE_OTHER): Payer: Medicare Other | Admitting: Gastroenterology

## 2022-03-28 ENCOUNTER — Ambulatory Visit: Payer: 59 | Admitting: Physician Assistant

## 2022-04-24 ENCOUNTER — Non-Acute Institutional Stay: Payer: 59 | Admitting: Family Medicine

## 2022-04-24 ENCOUNTER — Encounter: Payer: Self-pay | Admitting: Family Medicine

## 2022-04-24 VITALS — BP 110/70 | HR 89 | Resp 18

## 2022-04-24 DIAGNOSIS — Z515 Encounter for palliative care: Secondary | ICD-10-CM

## 2022-04-24 DIAGNOSIS — E43 Unspecified severe protein-calorie malnutrition: Secondary | ICD-10-CM

## 2022-04-24 NOTE — Progress Notes (Signed)
? ? ?Manufacturing engineer ?Community Palliative Care Consult Note ?Telephone: [phone]  ?Fax: [phone]  ? ?Date of encounter: 04/24/22 ?12:46 PM ?PATIENT NAME: Raymond Walker. ?[facility] ?[address] ?[place] [address]   ?[phone] (home)  ?DOB: [birth year] ?MRN: [id number] ?PRIMARY CARE PROVIDER:    ?[name], MD,  ?[place] [address] ?[phone] ? ?REFERRING PROVIDER:   ?[name], MD ?[address] ?[address] ?[place],  [place] [address] ?[phone] ? ?RESPONSIBLE PARTY:    ?Contact Information   ? ? Name Relation Home Work Mobile  ? [name],[name] Niece [phone]  [phone]  ? Meranda,Victoria Niece   [phone]  ? ?  ? ? ? ?I met face to face with patient in his assisted living facility. Palliative Care was asked to follow this patient by consultation request of  Luking, Elayne Snare, MD to address advance care planning and complex medical decision making. This is the initial visit.  ? ? ?      ASSESSMENT, SYMPTOM MANAGEMENT AND PLAN / RECOMMENDATIONS:  ?Severe protein calorie malnutrition ?Albumin when last measured was steadily declining at 2.0 on 12/07/21. ?Encourage frequent intake of protein with supplements TID in addition to meals. ? ?Palliative Care Encounter by specialist ?Need to formally address goals of care with legal guardian ? ? ? ?Follow up Palliative Care Visit: Palliative care will continue to follow for complex medical decision making, advance care planning, and clarification of goals. Return 4 weeks or prn. ? ? ? ?This visit was coded based on medical decision making (MDM). ? ?PPS: 60% ? ?HOSPICE ELIGIBILITY/DIAGNOSIS: TBD ? ?Chief Complaint:  ?[facility] received a referral to follow up with patient for chronic disease management of COPD, DM and recurrent pancreatitis after pt was not admitted to Hospice services.  Visit is also to address advance directive planning and defining/refining  goals of care.  ? ?HISTORY OF PRESENT ILLNESS:  Raymond Walker. is a 71 y.o. year old male  with COPD, DM, hx of pancreatitis and recurrent nephrolithiasis.  He states he does not get enough to eat and cannot gain any weight.  Pt denies any hx of DM, PAD, HTN and pancreatitis.  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 He states that he walks and continues to drive.  Has urge incontinence and wears a brief.  Denies any color changes, skin breakdown or cramping.  He states his hands are always cool.  He states at times his BP will be elevated after he smokes.  Mobilizes with rollator.  Denies interest in using nutritional supplements like ensure to help maintain or gain weight. He has productive cough which has not changed, denies fever, worsening SOB above baseline or DOE. ? ? ? ?History obtained from review of EMR, discussion with facility staff and/or Mr. Postlewait.  ?I reviewed available labs, medications, imaging, studies and related documents from the EMR.  Records reviewed and summarized above.  ? ?ROS ?General: NAD ?EYES: denies vision changes ?ENMT: denies dysphagia ?Cardiovascular: denies chest pain, denies DOE ?Pulmonary: endorses productive cough, denies increased SOB ?Abdomen: endorses good appetite, denies constipation, endorses continence of bowel ?GU: denies dysuria, endorses urge incontinence of urine ?MSK:  denies increased weakness, no falls reported ?Skin: denies rashes or wounds ?Neurological: denies pain, denies insomnia ?Psych: Endorses positive mood ?Heme/lymph/immuno: denies bruises, abnormal bleeding ? ?Physical Exam: ?Current and past weights: 142 lbs 13.7 ounces as of 02/01/22 with no  weight loss since January ?Constitutional: NAD ?General: Underweight ?EYES: anicteric sclera, lids intact, no discharge  ?ENMT: intact hearing, oral mucous membranes moist, dentition intact ?CV: S1S2, RRR with occasional premature beat, no LE edema ?Pulmonary: Diminished throughout, no  increased work of breathing, no cough, room air ?Abdomen: normo-active BS + 4 quadrants, soft and non tender, no ascites ?GU: deferred ?MSK: noted sarcopenia of all 4 limbs (worse in BLE), moves all extremities, ambulatory, uses rollator for mobility ?Skin: warm and dry, no rashes or wounds on visible skin ?Neuro:  no generalized weakness, noted cognitive impairment ?Psych: non-anxious affect, A and O x 3 ?Hem/lymph/immuno: no widespread bruising ? ?CURRENT PROBLEM LIST:  ?Patient Active Problem List  ? Diagnosis Date Noted  ? Right ureteral stone 01/01/2022  ? Abdominal pain 01/01/2022  ? Sepsis secondary to UTI ([place]) 12/06/2021  ? Leukocytosis 12/06/2021  ? Hypoalbuminemia due to protein-calorie malnutrition ([place]) 12/06/2021  ? Lactic acidosis 12/06/2021  ? AKI (acute kidney injury) ([place]) 12/06/2021  ? Essential hypertension 12/06/2021  ? Mixed hyperlipidemia 12/06/2021  ? GERD (gastroesophageal reflux disease) 12/06/2021  ? Nephrolithiasis 11/21/2021  ? Low back pain 09/19/2021  ? Cognitive dysfunction 06/23/2020  ? History of seizures 05/31/2020  ? Erectile dysfunction 05/02/2020  ? Caregiver with fatigue 05/02/2020  ? 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 Goals of care, counseling/discussion   ? Palliative care by specialist   ? DNR (do not resuscitate)   ? Vitamin D deficiency 01/24/2020  ? Sepsis ([place]) 01/23/2020  ? Altered mental status   ? Hypokalemia   ? History of kidney stones   ? Hematuria   ? UTI (urinary tract infection)   ? CHRONIC Bladder outlet obstruction   ? PAD (peripheral artery disease) ([place]) 09/15/2019  ? Protein-calorie malnutrition, severe 08/21/2019  ? 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[place])   ? Cellulitis of right lower extremity   ? 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[place]) 08/19/2019  ? Impaired glucose tolerance   ? COPD (chronic obstructive pulmonary disease) ([place])   ? Thrombocytopenia ([place]) 08/10/2018  ? Aortic atherosclerosis ([place]) 10/14/2016  ? Coronary artery calcification seen  on CAT scan 10/14/2016  ? Other emphysema ([place]) 10/14/2016  ? Elevated blood pressure 12/03/2011  ? Chest pain 12/03/2011  ? Tobacco abuse 12/03/2011  ? Right leg pain 12/03/2011  ? ?PAST MEDICAL HISTORY:  ?Active Ambulatory Problems  ?  Diagnosis Date Noted  ? Elevated blood pressure 12/03/2011  ? Chest pain 12/03/2011  ? Tobacco abuse 12/03/2011  ? Right leg pain 12/03/2011  ? Aortic atherosclerosis ([place]) 10/14/2016  ? Coronary artery calcification seen on CAT scan 10/14/2016  ? Other emphysema ([place]) 10/14/2016  ? Thrombocytopenia ([place]) 08/10/2018  ? 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[place]) 08/19/2019  ? Impaired glucose tolerance   ? COPD (chronic obstructive pulmonary disease) ([place])   ? Protein-calorie malnutrition, severe 08/21/2019  ? 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[place])   ? Cellulitis of right lower extremity   ? PAD (peripheral artery disease) ([place]) 09/15/2019  ? Sepsis ([place]) 01/23/2020  ? Altered mental status   ? Hypokalemia   ? History of kidney stones   ? Hematuria   ? UTI (urinary tract infection)   ? CHRONIC Bladder outlet obstruction   ? Vitamin D deficiency 01/24/2020  ? 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 [place])   ? Goals of care, counseling/discussion   ? Palliative care by specialist   ? DNR (do not resuscitate)   ? Erectile dysfunction 05/02/2020  ? Caregiver with  fatigue 05/02/2020  ? History of seizures 05/31/2020  ? Cognitive dysfunction 06/23/2020  ? Low back pain 09/19/2021  ? Nephrolithiasis 11/21/2021  ? Sepsis secondary to UTI ([place]) 12/06/2021  ? Leukocytosis 12/06/2021  ? Hypoalbuminemia due to protein-calorie malnutrition ([place]) 12/06/2021  ? Lactic acidosis 12/06/2021  ? AKI (acute kidney injury) ([place]) 12/06/2021  ? Essential hypertension 12/06/2021  ? Mixed hyperlipidemia 12/06/2021  ? GERD (gastroesophageal reflux disease) 12/06/2021  ? Right ureteral stone 01/01/2022  ? Abdominal pain 01/01/2022  ? ?Resolved Ambulatory Problems  ?  Diagnosis Date Noted  ? MSSA bacteremia 08/21/2019   ? ?Past Medical History:  ?Diagnosis Date  ? Deformity   ? Full dentures   ? Hypertension   ? Pancreatitis, recurrent   ? Pneumonia   ? ?SOCIAL HX:  ?Social History  ? ?Tobacco Use  ? Smoking status: Former

## 2022-09-03 NOTE — Telephone Encounter (Signed)
Closing encounter over 52 months old.

## 2022-10-16 ENCOUNTER — Non-Acute Institutional Stay: Payer: 59 | Admitting: Family Medicine

## 2022-10-16 ENCOUNTER — Encounter: Payer: Self-pay | Admitting: Family Medicine

## 2022-10-16 VITALS — BP 160/80 | HR 92 | Wt 156.0 lb

## 2022-10-16 DIAGNOSIS — J449 Chronic obstructive pulmonary disease, unspecified: Secondary | ICD-10-CM

## 2022-10-16 DIAGNOSIS — Z720 Tobacco use: Secondary | ICD-10-CM

## 2022-10-16 DIAGNOSIS — E43 Unspecified severe protein-calorie malnutrition: Secondary | ICD-10-CM

## 2022-10-16 NOTE — Progress Notes (Signed)
Raymond Walker Consult Note Telephone: [phone]  Fax: [phone]    Date of encounter: 10/16/22 3:56 PM PATIENT NAME: Raymond Walker. Raymond Walker [address]   [phone] (home)  DOB: 24-Apr-1951 MRN: [id number] PRIMARY CARE PROVIDER:    [name], MD,  [place] [address] [phone]  REFERRING PROVIDER:   [name], MD [place],  Raymond Walker [address] [phone]  RESPONSIBLE PARTY:    Contact Information     Name Relation Home Work Mobile   Raymond Walker,Raymond Niece [phone]  [phone]   [place] Niece   [phone]        I met face to face with patient in his assisted living facility. Palliative Care was asked to follow this patient by consultation request of  Raymond Walker, Raymond Snare, MD to address advance care planning and complex medical decision making. This is a follow up visit   ASSESSMENT , SYMPTOM MANAGEMENT AND PLAN / RECOMMENDATIONS:   Protein calorie malnutrition Improved slightly with mild weight gain over 8 months. Continue to have small, frequent high protein meals/snacks throughout his waking day.  2.    COPD with tobacco abuse Stable currently with non-productive cough Not interested in smoking cessation Encourage compliance with recommended vaccinations-flu, pneumonia and RSV  Advance Care Planning/Goals of Care: Goals include to maximize quality of life and symptom management.   Our advance care planning conversation included a discussion about:    The value and importance of advance care planning-has living will and [facility] POA on file  Exploration of personal, cultural or spiritual beliefs that might influence medical decisions.  Pt has a living will declaring a desire to not have life prolonged in terminal condition whether death will result in short period of time, he becomes unconscious and is not  expected with reasonable certainty to regain consciousness or suffers from dementia or other condition that substantially impacts cognitive function that is not expected to get better.  He did however indicate a willingness even under the above specified conditions to receive tube feedings.  Requests to be made as comfortable as possible and gave Raymond Walker POA the ability to override his wishes. Exploration of goals of care in the event of a sudden injury or illness  Identification of a healthcare agent-HC POA/legal guardian-Raymond Walker.  2nd Raymond Walker POA following Raymond Walker is [employer] Review and Designer, multimedia of an  advance directive document . Decision not to resuscitate or to de-escalate disease focused treatments due to poor prognosis. CODE STATUS: Last code status was DNR/DNI with comfort measures     Follow up Palliative Care Visit: Palliative care will continue to follow for complex medical decision making, advance care planning, and clarification of goals. Return 4 weeks or prn.    This visit was coded based on medical decision making (MDM).  PPS: 60%  HOSPICE ELIGIBILITY/DIAGNOSIS: TBD  Chief Complaint:  Palliative Care is continuing to follow patient for chronic medical management in setting of dementia and to assist with advanced care planning, refining and defining goals of care.   HISTORY OF PRESENT ILLNESS:  Raymond Gertner. is a 71 y.o. year old male with COPD, DM, hx of pancreatitis and recurrent nephrolithiasis.  He states he does not get enough to eat and cannot gain any weight.  Pt denies falls, CP, SOB above baseline or DOE, self propels in his wc. He is able  to bathe and dress himself. He is continent of bowel and bladder, reports good appetite but inability to gain weight. He continues to smoke daily.   History obtained from review of EMR, discussion with facility staff and/or Raymond Walker.   I reviewed EMR for available labs, medications, imaging, studies and related  documents.  There are no new records since last visit/Records reviewed and summarized above.   ROS General: NAD ENMT: denies dysphagia Cardiovascular: denies chest pain, denies DOE Pulmonary: cough productive of clear phlegm, denies increased SOB Abdomen: endorses good appetite, denies constipation, endorses continence of bowel GU: denies dysuria, endorses continence of urine MSK:  denies increased weakness,  no falls reported Skin: denies rashes or wounds Neurological: denies pain, denies insomnia Psych: Endorses positive mood Heme/lymph/immuno: denies bruises, abnormal bleeding  Physical Exam: Current and past weights: 156 lbs as of today, weight 02/01/22 was 142.5 lbs Constitutional: NAD General: thin, WD ENMT: intact hearing CV: S1S2, RRR, no LE edema Pulmonary: CTAB except bibasilar is lower no increased work of breathing, no cough, room air Abdomen: normo-active BS + 4 quadrants, soft and non tender, no ascites MSK: no sarcopenia, moves all extremities, ambulatory for short distances, uses wc for mobility Skin: warm and dry, no rashes or wounds on visible skin Neuro:  no generalized weakness,  no cognitive impairment Psych: non-anxious affect, A and O x 3 Hem/lymph/immuno: no widespread bruising   Thank you for the opportunity to participate in the care of Raymond Walker.  The palliative care team will continue to follow. Please call our office at [phone] if we can be of additional assistance.   Marijo Conception, FNP -C  COVID-19 PATIENT SCREENING TOOL Asked and negative response unless otherwise noted:   Have you had symptoms of covid, tested positive or been in contact with someone with symptoms/positive test in the past 5-10 days?  No

## 2022-12-12 ENCOUNTER — Other Ambulatory Visit: Payer: Self-pay | Admitting: Family Medicine

## 2022-12-12 ENCOUNTER — Ambulatory Visit (INDEPENDENT_AMBULATORY_CARE_PROVIDER_SITE_OTHER): Payer: 59 | Admitting: Family Medicine

## 2022-12-12 VITALS — BP 118/78 | HR 90 | Temp 97.6°F | Wt 156.8 lb

## 2022-12-12 DIAGNOSIS — I739 Peripheral vascular disease, unspecified: Secondary | ICD-10-CM | POA: Diagnosis not present

## 2022-12-12 DIAGNOSIS — D649 Anemia, unspecified: Secondary | ICD-10-CM

## 2022-12-12 DIAGNOSIS — Z89431 Acquired absence of right foot: Secondary | ICD-10-CM | POA: Insufficient documentation

## 2022-12-12 DIAGNOSIS — R972 Elevated prostate specific antigen [PSA]: Secondary | ICD-10-CM

## 2022-12-12 DIAGNOSIS — E785 Hyperlipidemia, unspecified: Secondary | ICD-10-CM

## 2022-12-12 DIAGNOSIS — J449 Chronic obstructive pulmonary disease, unspecified: Secondary | ICD-10-CM

## 2022-12-12 HISTORY — DX: Acquired absence of right foot: Z89.431

## 2022-12-12 NOTE — Progress Notes (Signed)
   Subjective:    Patient ID: Raymond Radon., male    DOB: [birth year], 71 y.o.   MRN: [id number]  HPI Patient comes to be reestablished last seen here approximately a year ago now staying at [facility] He does have underlying history of COPD and peripheral vascular disease He still smokes Denies any claudication denies chest pain or shortness of breath Does take acid blocker daily Still smokes but he only smokes 1 pack/week Patient not diabetic    Review of Systems     Objective:   Physical Exam  General-in no acute distress Eyes-no discharge Lungs-respiratory rate normal, CTA CV-no murmurs,RRR Extremities skin warm dry no edema Neuro grossly normal Behavior normal, alert We will discuss colonoscopy and follow-up Patient is not diabetic He does have partial amputation on the right side foot exam bilateral no sign of any ulcers Patient refuses flu shot    Assessment & Plan:  1. Anemia, unspecified type Check CBC.  Await results - Lipid panel - Hepatic Function Panel - Basic Metabolic Panel (7) - CBC with Differential/Platelet  2. PAD (peripheral artery disease) (HCC) Quit smoking.  Healthy diet recommended check labs more than likely restart statin - Lipid panel - Hepatic Function Panel - Basic Metabolic Panel (7) - CBC with Differential/Platelet  3. Chronic obstructive pulmonary disease, unspecified COPD type ([place]) Patient encouraged to quit smoking.  Follow-up if any problems  4. Hyperlipidemia, unspecified hyperlipidemia type May have to restart statin  5. Elevated PSA History of elevated PSA recheck this unknown status  6. Status post transmetatarsal amputation of foot, right (HCC) No sign of any ulcerations or abscess currently patient encouraged to quit smoking  Follow-up 3 months

## 2022-12-13 LAB — LIPID PANEL
Chol/HDL Ratio: 5.4 ratio — ABNORMAL HIGH (ref 0.0–5.0)
Cholesterol, Total: 135 mg/dL (ref 100–199)
HDL: 25 mg/dL — ABNORMAL LOW (ref >39)
LDL Chol Calc (NIH): 73 mg/dL (ref 0–99)
Triglycerides: 218 mg/dL — ABNORMAL HIGH (ref 0–149)
VLDL Cholesterol Cal: 37 mg/dL (ref 5–40)

## 2022-12-13 LAB — HEPATIC FUNCTION PANEL
ALT: 10 IU/L (ref 0–44)
AST: 9 IU/L (ref 0–40)
Albumin: 4 g/dL (ref 3.8–4.8)
Alkaline Phosphatase: 123 IU/L — ABNORMAL HIGH (ref 44–121)
Bilirubin Total: 0.3 mg/dL (ref 0.0–1.2)
Bilirubin, Direct: 0.14 mg/dL (ref 0.00–0.40)
Total Protein: 7.5 g/dL (ref 6.0–8.5)

## 2022-12-13 LAB — CBC WITH DIFFERENTIAL/PLATELET
Basophils Absolute: 0.1 10*3/uL (ref 0.0–0.2)
Basos: 1 %
EOS (ABSOLUTE): 0.1 10*3/uL (ref 0.0–0.4)
Eos: 1 %
Hematocrit: 46.6 % (ref 37.5–51.0)
Hemoglobin: 15.7 g/dL (ref 13.0–17.7)
Immature Grans (Abs): 0 10*3/uL (ref 0.0–0.1)
Immature Granulocytes: 0 %
Lymphocytes Absolute: 1.6 10*3/uL (ref 0.7–3.1)
Lymphs: 26 %
MCH: 29.1 pg (ref 26.6–33.0)
MCHC: 33.7 g/dL (ref 31.5–35.7)
MCV: 87 fL (ref 79–97)
Monocytes Absolute: 0.9 10*3/uL (ref 0.1–0.9)
Monocytes: 14 %
Neutrophils Absolute: 3.6 10*3/uL (ref 1.4–7.0)
Neutrophils: 58 %
Platelets: 139 10*3/uL — ABNORMAL LOW (ref 150–450)
RBC: 5.39 x10E6/uL (ref 4.14–5.80)
RDW: 13.6 % (ref 11.6–15.4)
WBC: 6.3 10*3/uL (ref 3.4–10.8)

## 2022-12-13 LAB — BASIC METABOLIC PANEL (7)
BUN/Creatinine Ratio: 17 (ref 10–24)
BUN: 19 mg/dL (ref 8–27)
CO2: 26 mmol/L (ref 20–29)
Chloride: 104 mmol/L (ref 96–106)
Creatinine, Ser: 1.1 mg/dL (ref 0.76–1.27)
Glucose: 74 mg/dL (ref 70–99)
Potassium: 4.4 mmol/L (ref 3.5–5.2)
Sodium: 144 mmol/L (ref 134–144)
eGFR: 71 mL/min/{1.73_m2} (ref >59)

## 2022-12-18 DIAGNOSIS — M6281 Muscle weakness (generalized): Secondary | ICD-10-CM | POA: Diagnosis not present

## 2022-12-21 DIAGNOSIS — M6281 Muscle weakness (generalized): Secondary | ICD-10-CM | POA: Diagnosis not present

## 2022-12-24 DIAGNOSIS — M6281 Muscle weakness (generalized): Secondary | ICD-10-CM | POA: Diagnosis not present

## 2022-12-31 DIAGNOSIS — M6281 Muscle weakness (generalized): Secondary | ICD-10-CM | POA: Diagnosis not present

## 2023-01-04 ENCOUNTER — Telehealth: Payer: Self-pay

## 2023-01-04 NOTE — Telephone Encounter (Signed)
  Type of form received:Request for Independent Assessment for personal Care Services Medical Form   Additional comments:   Received by:Tionna Gigante  Form should be Faxed/mailed to: (address/ fax #)No  Is patient requesting call for pickup:Yes  Form placed:  Dr Nicki Reaper folder   Attach charge sheet.  Provider will determine charge. No   Individual made aware of 3-5 business day turn around Yes

## 2023-01-07 DIAGNOSIS — M6281 Muscle weakness (generalized): Secondary | ICD-10-CM | POA: Diagnosis not present

## 2023-01-08 NOTE — Telephone Encounter (Signed)
Form was completed and forwarded

## 2023-01-20 DIAGNOSIS — R2689 Other abnormalities of gait and mobility: Secondary | ICD-10-CM | POA: Diagnosis not present

## 2023-02-06 DIAGNOSIS — R2689 Other abnormalities of gait and mobility: Secondary | ICD-10-CM | POA: Diagnosis not present

## 2023-02-17 ENCOUNTER — Telehealth: Payer: Self-pay | Admitting: Family Medicine

## 2023-02-17 NOTE — Telephone Encounter (Signed)
Nurses Patient may have a prescription for [facility] inhaler use as needed keep at bedside 2 puffs twice daily as needed rinse mouth after use with 3 refills  Please send prescription to his pharmacy he is a patient of [facility]

## 2023-02-19 ENCOUNTER — Telehealth: Payer: Self-pay

## 2023-02-19 MED ORDER — MOMETASONE FURO-FORMOTEROL FUM 100-5 MCG/ACT IN AERO: INHALATION_SPRAY | RESPIRATORY_TRACT | 3 refills | Status: DC

## 2023-02-19 NOTE — Telephone Encounter (Signed)
Call received from [facility] needing clarification on Dulera inhaler instructions not a PRN , and if could send in albuterol as a rescue inhaler. Please resend or advise

## 2023-02-19 NOTE — Telephone Encounter (Signed)
As for the [facility] 2 puffs twice daily rinse after use As for albuterol inhaler 2 puffs every 4 hours as needed for rescue inhaler May have 3 refills on each

## 2023-02-19 NOTE — Telephone Encounter (Signed)
Prescription sent electronically to pharmacy. 

## 2023-02-19 NOTE — Telephone Encounter (Signed)
100/5 It would not surprise me if this ends up being denied by insurance company but we can give it a go

## 2023-02-20 MED ORDER — MOMETASONE FURO-FORMOTEROL FUM 100-5 MCG/ACT IN AERO: INHALATION_SPRAY | RESPIRATORY_TRACT | 3 refills | Status: DC

## 2023-02-20 MED ORDER — ALBUTEROL SULFATE HFA 108 (90 BASE) MCG/ACT IN AERS: INHALATION_SPRAY | RESPIRATORY_TRACT | 3 refills | Status: DC

## 2023-02-20 NOTE — Telephone Encounter (Signed)
Prescriptions sent electronically to pharmcy

## 2023-02-26 ENCOUNTER — Telehealth: Payer: Self-pay | Admitting: *Deleted

## 2023-02-26 NOTE — Telephone Encounter (Signed)
Insurance called and stated [name] is non covered medication under patient's plan. They stated his plan will cover Symbicort HSA inhaler or [facility] [employer]   [facility]

## 2023-03-02 ENCOUNTER — Other Ambulatory Visit: Payer: Self-pay | Admitting: Family Medicine

## 2023-03-02 MED ORDER — BUDESONIDE-FORMOTEROL FUMARATE 80-4.5 MCG/ACT IN AERO: 2.0000 | INHALATION_SPRAY | Freq: Two times a day (BID) | RESPIRATORY_TRACT | 3 refills | Status: DC

## 2023-03-02 NOTE — Telephone Encounter (Signed)
Symbicort was sent in [facility] canceled

## 2023-03-04 ENCOUNTER — Telehealth: Payer: Self-pay

## 2023-03-04 NOTE — Telephone Encounter (Signed)
It was my understanding that the [facility] was not covered and the Symbicort was-this was on a previous message  So at that time I sent in Symbicort So I would feel it would be best to cancel [facility] Hopefully this clarifies any issues if any ongoing issues let me know

## 2023-03-04 NOTE — Telephone Encounter (Signed)
Call received from [facility] pharmacy needing clarification for [facility] and Symbicort inhalers. Please clarify which one should patient should be on. Please advise

## 2023-03-04 NOTE — Telephone Encounter (Signed)
[facility] stated they will cancel the [facility] and fill the Symbicort as prescribed

## 2023-03-13 ENCOUNTER — Other Ambulatory Visit: Payer: Self-pay | Admitting: Family Medicine

## 2023-03-14 ENCOUNTER — Ambulatory Visit: Payer: Medicare HMO | Admitting: Family Medicine

## 2023-03-14 DIAGNOSIS — J449 Chronic obstructive pulmonary disease, unspecified: Secondary | ICD-10-CM

## 2023-03-14 DIAGNOSIS — D696 Thrombocytopenia, unspecified: Secondary | ICD-10-CM | POA: Diagnosis not present

## 2023-03-14 DIAGNOSIS — I739 Peripheral vascular disease, unspecified: Secondary | ICD-10-CM

## 2023-03-14 DIAGNOSIS — Z89431 Acquired absence of right foot: Secondary | ICD-10-CM | POA: Diagnosis not present

## 2023-03-14 NOTE — Progress Notes (Signed)
Subjective:    Patient ID: Raymond Radon., male    DOB: [birth year], 72 y.o.   MRN: [id number]  HPI Patient  arrives for 3 month follow up. Patient stays at rest home Has underlying lung issues Still smokes Has been counseled to quit smoking  Status post transmetatarsal amputation of foot, right ([place]), Chronic  Chronic obstructive pulmonary disease, unspecified COPD type ([place]), Chronic  PAD (peripheral artery disease) (Raymond), Chronic  Seizure-like activity ([place]), Chronic  Thrombocytopenia ([place]), Chronic Patient has underlying lung issues.  Symbicort no longer covered Dulera no longer covered Also takes his medications on a regular basis. Labs reviewed in detail Review of Systems     Objective:   Physical Exam  General-in no acute distress Eyes-no discharge Lungs-respiratory rate normal, CTA CV-no murmurs,RRR Extremities skin warm dry no edema Neuro grossly normal Behavior normal, alert  Results for orders placed or performed in visit on 12/12/22  Lipid panel  Result Value Ref Range   Cholesterol, Total 135 100 - 199 mg/dL   Triglycerides 218 (H) 0 - 149 mg/dL   HDL 25 (L) >39 mg/dL   VLDL Cholesterol Cal 37 5 - 40 mg/dL   LDL Chol Calc (NIH) 73 0 - 99 mg/dL   Chol/HDL Ratio 5.4 (H) 0.0 - 5.0 ratio  Hepatic Function Panel  Result Value Ref Range   Total Protein 7.5 6.0 - 8.5 g/dL   Albumin 4.0 3.8 - 4.8 g/dL   Bilirubin Total 0.3 0.0 - 1.2 mg/dL   Bilirubin, Direct 0.14 0.00 - 0.40 mg/dL   Alkaline Phosphatase 123 (H) 44 - 121 IU/L   AST 9 0 - 40 IU/L   ALT 10 0 - 44 IU/L  Basic Metabolic Panel (7)  Result Value Ref Range   Glucose 74 70 - 99 mg/dL   BUN 19 8 - 27 mg/dL   Creatinine, Ser 1.10 0.76 - 1.27 mg/dL   eGFR 71 >59 mL/min/1.73   BUN/Creatinine Ratio 17 10 - 24   Sodium 144 134 - 144 mmol/L   Potassium 4.4 3.5 - 5.2 mmol/L   Chloride 104 96 - 106 mmol/L   CO2 26 20 - 29 mmol/L  CBC with Differential/Platelet  Result Value Ref Range   WBC  6.3 3.4 - 10.8 x10E3/uL   RBC 5.39 4.14 - 5.80 x10E6/uL   Hemoglobin 15.7 13.0 - 17.7 g/dL   Hematocrit 46.6 37.5 - 51.0 %   MCV 87 79 - 97 fL   MCH 29.1 26.6 - 33.0 pg   MCHC 33.7 31.5 - 35.7 g/dL   RDW 13.6 11.6 - 15.4 %   Platelets 139 (L) 150 - 450 x10E3/uL   Neutrophils 58 Not Estab. %   Lymphs 26 Not Estab. %   Monocytes 14 Not Estab. %   Eos 1 Not Estab. %   Basos 1 Not Estab. %   Neutrophils Absolute 3.6 1.4 - 7.0 x10E3/uL   Lymphocytes Absolute 1.6 0.7 - 3.1 x10E3/uL   Monocytes Absolute 0.9 0.1 - 0.9 x10E3/uL   EOS (ABSOLUTE) 0.1 0.0 - 0.4 x10E3/uL   Basophils Absolute 0.1 0.0 - 0.2 x10E3/uL   Immature Granulocytes 0 Not Estab. %   Immature Grans (Abs) 0.0 0.0 - 0.1 x10E3/uL        Assessment & Plan:  1. Status post transmetatarsal amputation of foot, right ([place]) Will have physical therapy at the rest home work with him to do gait training.  Also make sure that he can utilize a  walker We will try to get a walker for him with wheels handgrips in the CT   2. Chronic obstructive pulmonary disease, unspecified COPD type ([place]) Patient encouraged to quit smoking.  Continue Symbicort inhaler till it is finished and then use Dulera till it is finished then we will use a different inhaler that is covered by his insurance  3. PAD (peripheral artery disease) (HCC) Continue current measures including cholesterol medicine as well as Plavix  4. Thrombocytopenia ([place]) Monitor closely  Patient not a diabetic

## 2023-03-19 DIAGNOSIS — M6281 Muscle weakness (generalized): Secondary | ICD-10-CM | POA: Diagnosis not present

## 2023-03-27 DIAGNOSIS — M6281 Muscle weakness (generalized): Secondary | ICD-10-CM | POA: Diagnosis not present

## 2023-03-29 DIAGNOSIS — M6281 Muscle weakness (generalized): Secondary | ICD-10-CM | POA: Diagnosis not present

## 2023-04-03 DIAGNOSIS — M6281 Muscle weakness (generalized): Secondary | ICD-10-CM | POA: Diagnosis not present

## 2023-04-08 DIAGNOSIS — M6281 Muscle weakness (generalized): Secondary | ICD-10-CM | POA: Diagnosis not present

## 2023-04-10 DIAGNOSIS — M6281 Muscle weakness (generalized): Secondary | ICD-10-CM | POA: Diagnosis not present

## 2023-04-16 ENCOUNTER — Telehealth: Payer: Self-pay

## 2023-04-16 DIAGNOSIS — M6281 Muscle weakness (generalized): Secondary | ICD-10-CM | POA: Diagnosis not present

## 2023-04-16 NOTE — Telephone Encounter (Signed)
May have a prescription for a walker with a seat and wheels due to ataxia and weakness may send this to [facility] for [facility]

## 2023-04-16 NOTE — Telephone Encounter (Signed)
[facility] called checking Pt needed a walker sent to [place] nothing has been sent yet   Call back [phone]

## 2023-04-23 DIAGNOSIS — M6281 Muscle weakness (generalized): Secondary | ICD-10-CM | POA: Diagnosis not present

## 2023-04-26 NOTE — Telephone Encounter (Signed)
Order written and awaiting provider signature

## 2023-04-30 NOTE — Telephone Encounter (Signed)
Order faxed to [facility].  

## 2023-05-01 DIAGNOSIS — M6281 Muscle weakness (generalized): Secondary | ICD-10-CM | POA: Diagnosis not present

## 2023-05-02 DIAGNOSIS — M6281 Muscle weakness (generalized): Secondary | ICD-10-CM | POA: Diagnosis not present

## 2023-05-07 DIAGNOSIS — M6281 Muscle weakness (generalized): Secondary | ICD-10-CM | POA: Diagnosis not present

## 2023-05-09 DIAGNOSIS — M6281 Muscle weakness (generalized): Secondary | ICD-10-CM | POA: Diagnosis not present

## 2023-05-13 DIAGNOSIS — M6281 Muscle weakness (generalized): Secondary | ICD-10-CM | POA: Diagnosis not present

## 2023-05-20 DIAGNOSIS — M6281 Muscle weakness (generalized): Secondary | ICD-10-CM | POA: Diagnosis not present

## 2023-05-22 DIAGNOSIS — M6281 Muscle weakness (generalized): Secondary | ICD-10-CM | POA: Diagnosis not present

## 2023-06-12 DIAGNOSIS — M6281 Muscle weakness (generalized): Secondary | ICD-10-CM | POA: Diagnosis not present

## 2023-06-17 DIAGNOSIS — M6281 Muscle weakness (generalized): Secondary | ICD-10-CM | POA: Diagnosis not present

## 2023-06-19 DIAGNOSIS — M6281 Muscle weakness (generalized): Secondary | ICD-10-CM | POA: Diagnosis not present

## 2023-06-24 DIAGNOSIS — M6281 Muscle weakness (generalized): Secondary | ICD-10-CM | POA: Diagnosis not present

## 2023-06-26 DIAGNOSIS — M6281 Muscle weakness (generalized): Secondary | ICD-10-CM | POA: Diagnosis not present

## 2023-07-01 DIAGNOSIS — M6281 Muscle weakness (generalized): Secondary | ICD-10-CM | POA: Diagnosis not present

## 2023-07-03 ENCOUNTER — Emergency Department (HOSPITAL_COMMUNITY): Payer: Medicare HMO

## 2023-07-03 ENCOUNTER — Other Ambulatory Visit: Payer: Self-pay

## 2023-07-03 ENCOUNTER — Encounter (HOSPITAL_COMMUNITY): Payer: Self-pay | Admitting: *Deleted

## 2023-07-03 ENCOUNTER — Emergency Department (HOSPITAL_COMMUNITY)
Admission: EM | Admit: 2023-07-03 | Discharge: 2023-07-03 | Disposition: A | Discharge status: Home / Self Care | Payer: Medicare HMO | Attending: Emergency Medicine | Admitting: Emergency Medicine

## 2023-07-03 DIAGNOSIS — Z7902 Long term (current) use of antithrombotics/antiplatelets: Secondary | ICD-10-CM | POA: Insufficient documentation

## 2023-07-03 DIAGNOSIS — Z79899 Other long term (current) drug therapy: Secondary | ICD-10-CM | POA: Diagnosis not present

## 2023-07-03 DIAGNOSIS — I10 Essential (primary) hypertension: Secondary | ICD-10-CM | POA: Insufficient documentation

## 2023-07-03 DIAGNOSIS — J4541 Moderate persistent asthma with (acute) exacerbation: Secondary | ICD-10-CM | POA: Diagnosis not present

## 2023-07-03 DIAGNOSIS — Z743 Need for continuous supervision: Secondary | ICD-10-CM | POA: Diagnosis not present

## 2023-07-03 DIAGNOSIS — R6889 Other general symptoms and signs: Secondary | ICD-10-CM | POA: Diagnosis not present

## 2023-07-03 DIAGNOSIS — R0689 Other abnormalities of breathing: Secondary | ICD-10-CM | POA: Diagnosis not present

## 2023-07-03 DIAGNOSIS — Z20822 Contact with and (suspected) exposure to covid-19: Secondary | ICD-10-CM | POA: Diagnosis not present

## 2023-07-03 DIAGNOSIS — R0602 Shortness of breath: Secondary | ICD-10-CM

## 2023-07-03 DIAGNOSIS — J45901 Unspecified asthma with (acute) exacerbation: Secondary | ICD-10-CM

## 2023-07-03 DIAGNOSIS — J449 Chronic obstructive pulmonary disease, unspecified: Secondary | ICD-10-CM | POA: Diagnosis not present

## 2023-07-03 DIAGNOSIS — Z7951 Long term (current) use of inhaled steroids: Secondary | ICD-10-CM | POA: Diagnosis not present

## 2023-07-03 LAB — CBC
HCT: 47 % (ref 39.0–52.0)
Hemoglobin: 15.2 g/dL (ref 13.0–17.0)
MCH: 28.4 pg (ref 26.0–34.0)
MCHC: 32.3 g/dL (ref 30.0–36.0)
MCV: 87.9 fL (ref 80.0–100.0)
Platelets: 162 10*3/uL (ref 150–400)
RBC: 5.35 MIL/uL (ref 4.22–5.81)
RDW: 14.9 % (ref 11.5–15.5)
WBC: 8.6 10*3/uL (ref 4.0–10.5)
nRBC: 0 % (ref 0.0–0.2)

## 2023-07-03 LAB — RESP PANEL BY RT-PCR (RSV, FLU A&B, COVID)  RVPGX2
Influenza A by PCR: NEGATIVE
Influenza B by PCR: NEGATIVE
Resp Syncytial Virus by PCR: NEGATIVE
SARS Coronavirus 2 by RT PCR: NEGATIVE

## 2023-07-03 LAB — COMPREHENSIVE METABOLIC PANEL
ALT: 15 U/L (ref 0–44)
AST: 13 U/L — ABNORMAL LOW (ref 15–41)
Albumin: 3.7 g/dL (ref 3.5–5.0)
Alkaline Phosphatase: 99 U/L (ref 38–126)
Anion gap: 6 (ref 5–15)
BUN: 23 mg/dL (ref 8–23)
CO2: 28 mmol/L (ref 22–32)
Calcium: 9.4 mg/dL (ref 8.9–10.3)
Chloride: 103 mmol/L (ref 98–111)
Creatinine, Ser: 1.06 mg/dL (ref 0.61–1.24)
GFR, Estimated: >60 mL/min (ref >60)
Glucose, Bld: 94 mg/dL (ref 70–99)
Potassium: 4.2 mmol/L (ref 3.5–5.1)
Sodium: 137 mmol/L (ref 135–145)
Total Bilirubin: 0.6 mg/dL (ref 0.3–1.2)
Total Protein: 7.8 g/dL (ref 6.5–8.1)

## 2023-07-03 LAB — BRAIN NATRIURETIC PEPTIDE: B Natriuretic Peptide: 135 pg/mL — ABNORMAL HIGH (ref 0.0–100.0)

## 2023-07-03 LAB — TROPONIN I (HIGH SENSITIVITY)
Troponin I (High Sensitivity): 9 ng/L (ref <18)
Troponin I (High Sensitivity): 8 ng/L (ref <18)

## 2023-07-03 MED ORDER — IPRATROPIUM-ALBUTEROL 0.5-2.5 (3) MG/3ML IN SOLN
3.0000 mL | Freq: Once | RESPIRATORY_TRACT | Status: AC
  Administered 2023-07-03: 3 mL via RESPIRATORY_TRACT
  Filled 2023-07-03: qty 3

## 2023-07-03 MED ORDER — METHYLPREDNISOLONE SODIUM SUCC 125 MG IJ SOLR
125.0000 mg | Freq: Once | INTRAMUSCULAR | Status: AC
  Administered 2023-07-03: 125 mg via INTRAVENOUS
  Filled 2023-07-03: qty 2

## 2023-07-03 NOTE — Discharge Instructions (Addendum)
Please take your medications as prescribed. I recommend close follow-up with PCP for reevaluation.  Please do not hesitate to return to emergency department if worrisome signs symptoms we discussed become apparent.  

## 2023-07-03 NOTE — ED Triage Notes (Signed)
Pt BIB [facility] from [facility] for c/o sob x one week; pt states he has been coughing up yellow sputum and having sob  Pt denies any pain at this time  Pt using accessory muscles at this time to breath  Pt given duoneb and solumedrol 125mg  IV by EMS en route to hospital

## 2023-07-03 NOTE — ED Provider Notes (Signed)
[place] [facility] Provider Note   CSN: [id number] Arrival date & time: 07/03/23  1046     History  Chief Complaint  Patient presents with   Shortness of Breath    Raymond Walker. is a 72 y.o. male with a past medical history of asthma, hypertension presents today for evaluation of shortness of breath.  States he has had shortness of breath in about a week.  He also reports some chest tightness.  States symptoms got worse with movement.  He endorses productive cough with some sputum.  Denies any fever, nausea, vomiting, bowel change, urinary symptoms.  Patient reports that he has used his albuterol inhaler at home with some relief.  No known sick contacts. Denies any history of CHF. Denies leg swelling.   Shortness of Breath     Past Medical History:  Diagnosis Date   Caregiver with fatigue 05/02/2020   COPD (chronic obstructive pulmonary disease) (HCC)    Deformity    equinus and metatarsal   Erectile dysfunction    Erectile dysfunction 05/02/2020   Full dentures    History of kidney stones    History of seizures 05/31/2020   Between ages 18 and 21   Hypertension    Impaired glucose tolerance    Pancreatitis, recurrent    Pneumonia    Thrombocytopenia (HCC) 08/10/2018   Staying in the low 100s will follow closely   Past Surgical History:  Procedure Laterality Date   ABDOMINAL AORTOGRAM W/LOWER EXTREMITY Bilateral 08/20/2019   Procedure: ABDOMINAL AORTOGRAM W/LOWER EXTREMITY;  Surgeon: Cephus Shelling, MD;  Location: [facility] INVASIVE CV LAB;  Service: Cardiovascular;  Laterality: Bilateral;   ACHILLES TENDON SURGERY Right 08/26/2020   Procedure: ACHILLES TENDON LENGTHENING;  Surgeon: Edwin Cap, DPM;  Location: [facility] OR;  Service: Podiatry;  Laterality: Right;   AMPUTATION Right 09/15/2019   Procedure: AMPUTATION RIGHT TOES One, Two, And Three;  Surgeon: Maeola Harman, MD;  Location: [facility] OR;  Service: Vascular;  Laterality:  Right;   APPENDECTOMY     CATARACT EXTRACTION W/PHACO Right 05/02/2018   Procedure: CATARACT EXTRACTION PHACO AND INTRAOCULAR LENS PLACEMENT (IOC);  Surgeon: Fabio Pierce, MD;  Location: [facility] ORS;  Service: Ophthalmology;  Laterality: Right;  CDE: 11.11   CATARACT EXTRACTION W/PHACO Left 07/04/2018   Procedure: CATARACT EXTRACTION PHACO AND INTRAOCULAR LENS PLACEMENT (IOC);  Surgeon: Fabio Pierce, MD;  Location: [facility] ORS;  Service: Ophthalmology;  Laterality: Left;  CDE: 7.15   CHOLECYSTECTOMY     CYSTOSCOPY W/ URETERAL STENT PLACEMENT Right 11/22/2021   Procedure: CYSTOSCOPY WITH RETROGRADE PYELOGRAM/URETERAL STENT PLACEMENT;  Surgeon: Marcine Matar, MD;  Location: [facility] ORS;  Service: Urology;  Laterality: Right;   CYSTOSCOPY WITH RETROGRADE PYELOGRAM, URETEROSCOPY AND STENT PLACEMENT Right 02/01/2022   Procedure: CYSTOSCOPY WITH RETROGRADE PYELOGRAM, URETEROSCOPY AND STENT PLACEMENT;  Surgeon: Malen Gauze, MD;  Location: [facility] ORS;  Service: Urology;  Laterality: Right;   FEMORAL-POPLITEAL BYPASS GRAFT Right 09/15/2019   Procedure: Right BYPASS GRAFT FEMORAL to Above Knee POPLITEAL ARTERY;  Surgeon: Maeola Harman, MD;  Location: [facility] OR;  Service: Vascular;  Laterality: Right;   FEMORAL-POPLITEAL BYPASS GRAFT Right 01/26/2020   Procedure: IRRIGATION AND DEBRIDEMENT RIGHT FEMORAL POPLITEAL BYPASS SITE;  Surgeon: Maeola Harman, MD;  Location: [facility] OR;  Service: Vascular;  Laterality: Right;   HOLMIUM LASER APPLICATION Right 02/01/2022   Procedure: HOLMIUM LASER APPLICATION;  Surgeon: Malen Gauze, MD;  Location: [facility] ORS;  Service: Urology;  Laterality:  Right;   MULTIPLE TOOTH EXTRACTIONS     ORCHIECTOMY     PERIPHERAL VASCULAR INTERVENTION Left 08/20/2019   Procedure: PERIPHERAL VASCULAR INTERVENTION;  Surgeon: Cephus Shelling, MD;  Location: [facility] INVASIVE CV LAB;  Service: Cardiovascular;  Laterality: Left;  common/external iliac   TRANSMETATARSAL AMPUTATION Right  01/26/2020   Procedure: TRANSMETATARSAL AMPUTATION;  Surgeon: Maeola Harman, MD;  Location: [facility] OR;  Service: Vascular;  Laterality: Right;   TRANSMETATARSAL AMPUTATION Right 08/26/2020   Procedure: TRANSMETATARSAL AMPUTATION REVISION;  Surgeon: Edwin Cap, DPM;  Location: [facility] OR;  Service: Podiatry;  Laterality: Right;   VASECTOMY     WOUND DEBRIDEMENT Right 08/26/2020   Procedure: EXCISION WOUND;  Surgeon: Edwin Cap, DPM;  Location: [facility] OR;  Service: Podiatry;  Laterality: Right;     Home Medications Prior to Admission medications   Medication Sig Start Date End Date Taking? Authorizing Provider  albuterol (VENTOLIN HFA) 108 (90 Base) MCG/ACT inhaler INHALE 2 PUFFS INTO THE LUNGS EVERY 4 HOURS AS NEEDED FOR WHEEZING OR SHORTNESS OF BREATH 08/09/21   Babs Sciara, MD  albuterol (VENTOLIN HFA) 108 (90 Base) MCG/ACT inhaler 2 puffs every 4 hours as needed for rescue inhaler 02/20/23   Babs Sciara, MD  budesonide-formoterol (SYMBICORT) 80-4.5 MCG/ACT inhaler Inhale 2 puffs into the lungs 2 (two) times daily. 03/02/23   Babs Sciara, MD  clopidogrel (PLAVIX) 75 MG tablet TAKE 1 TABLET(75 MG) BY MOUTH DAILY WITH BREAKFAST 09/20/21   Babs Sciara, MD  divalproex (DEPAKOTE) 125 MG DR tablet Take 125 mg by mouth 3 (three) times daily.    [provider]  mometasone-formoterol (DULERA) 100-5 MCG/ACT AERO Inhale 2 puffs into the lungs 2 (two) times daily. Inhale 2 puffs into the lungs twice daily. Rinse mouth after use. Keep at bedside.    [provider]  Nutritional Supplement LIQD Take 1 Bottle by mouth 2 (two) times daily. Mighty Shake    [provider]  pantoprazole (PROTONIX) 40 MG tablet TAKE (1) TABLET BY MOUTH ONCE DAILY. 03/14/23   Babs Sciara, MD  rosuvastatin (CRESTOR) 20 MG tablet TAKE 1 TABLET(20 MG) BY MOUTH DAILY 01/22/22   Babs Sciara, MD      Allergies    Patient has no known allergies.    Review of Systems   Review of  Systems  Respiratory:  Positive for shortness of breath.     Physical Exam Updated Vital Signs BP (!) 149/93 (BP Location: Left Arm)   Pulse 94   Temp 98.6 F (37 C) (Oral)   Resp (!) 22   Ht 6' (1.829 m)   Wt 73.5 kg   SpO2 97%   BMI 21.97 kg/m  Physical Exam Vitals and nursing note reviewed.  Constitutional:      Appearance: Normal appearance.  HENT:     Head: Normocephalic and atraumatic.     Mouth/Throat:     Mouth: Mucous membranes are moist.  Eyes:     General: No scleral icterus. Cardiovascular:     Rate and Rhythm: Normal rate and regular rhythm.     Pulses: Normal pulses.     Heart sounds: Normal heart sounds.  Pulmonary:     Effort: Pulmonary effort is normal.     Breath sounds: Wheezing present.  Abdominal:     General: Abdomen is flat.     Palpations: Abdomen is soft.     Tenderness: There is no abdominal tenderness.  Musculoskeletal:  General: No deformity.  Skin:    General: Skin is warm.     Findings: No rash.  Neurological:     General: No focal deficit present.     Mental Status: He is alert.  Psychiatric:        Mood and Affect: Mood normal.    ED Results / Procedures / Treatments   Labs (all labs ordered are listed, but only abnormal results are displayed) Labs Reviewed  RESP PANEL BY RT-PCR (RSV, FLU A&B, COVID)  [id number]  COMPREHENSIVE METABOLIC PANEL  CBC  BRAIN NATRIURETIC PEPTIDE  TROPONIN I (HIGH SENSITIVITY)    EKG None  Radiology No results found.  Procedures Procedures    Medications Ordered in ED Medications  ipratropium-albuterol (DUONEB) 0.5-2.5 (3) MG/3ML nebulizer solution 3 mL (has no administration in time range)  methylPREDNISolone sodium succinate (SOLU-MEDROL) 125 mg/2 mL injection 125 mg (has no administration in time range)    ED Course/ Medical Decision Making/ A&P                             Medical Decision Making Amount and/or Complexity of Data Reviewed Labs: ordered. Radiology:  ordered.  Risk Prescription drug management.   This patient presents to the ED for shortness of breath, this involves an extensive number of treatment options, and is a complaint that carries with a high risk of complications and morbidity.  The differential diagnosis includes CHF/ACS, COPD asthma, pneumonia, anaphylaxis, PE, pneumothorax, anxiety, COVID-19. This is not an exhaustive list.  Lab tests: I ordered and personally interpreted labs.  The pertinent results include: WBC unremarkable. Hbg unremarkable. Platelets unremarkable. Electrolytes unremarkable. BUN, creatinine unremarkable. Bnp 135. Delta troponin negative.  Imaging studies: I ordered imaging studies, personally reviewed, interpreted imaging and agree with the radiologist's interpretations. The results include: Chest DG was unremarkable.    Problem list/ ED course/ Critical interventions/ Medical management: HPI: See above Vital signs within normal range and stable throughout visit. Laboratory/imaging studies significant for: See above. On physical examination, patient is afebrile and appears in no acute distress.  There was some wheezing on auscultation.  Symptoms most likely secondary to asthma exacerbation.  Unlikely ACS/MI, EKG with no ischemic changes, delta troponin was negative.  Chest x-ray showed no evidence of pneumonia or pneumothorax.  I have low suspicion for PE, patient is not hypoxic, not tachycardic, no personal or family history of PE/DVT, asthma seems more likely to be the cause.  Given DuoNeb x 1 and Solu-Medrol.  Reevaluation of patient after these medications showed that the patient improved.  O2 sat was 96 percent on room air.  Vital signs normal.  Patient is stable for discharge. Advised patient to follow-up with PCP for reevaluation. Strict return precaution discussed.  I have reviewed the patient home medicines and have made adjustments as needed.  Cardiac monitoring/EKG: The patient was maintained on a  cardiac monitor.  I personally reviewed and interpreted the cardiac monitor which showed an underlying rhythm of: sinus rhythm.  Additional history obtained: External records from outside source obtained and reviewed including: Chart review including previous notes, labs, imaging.  Consultations obtained:  Disposition Continued outpatient therapy. Follow-up with PCP recommended for reevaluation of symptoms. Treatment plan discussed with patient.  Pt acknowledged understanding was agreeable to the plan. Worrisome signs and symptoms were discussed with patient, and patient acknowledged understanding to return to the ED if they noticed these signs and symptoms. Patient was stable upon discharge.  This chart was dictated using voice recognition software.  Despite best efforts to proofread,  errors can occur which can change the documentation meaning.          Final Clinical Impression(s) / ED Diagnoses Final diagnoses:  Shortness of breath  Moderate asthma with exacerbation, unspecified whether persistent    Rx / DC Orders ED Discharge Orders     None         Jeanelle Malling, [place] 07/03/23 1450    Pricilla Loveless, MD 07/03/23 1715

## 2023-07-03 NOTE — ED Notes (Signed)
Sandwhich and drink given to pt

## 2023-07-04 ENCOUNTER — Ambulatory Visit: Payer: Medicare HMO | Admitting: Nurse Practitioner

## 2023-07-04 DIAGNOSIS — I10 Essential (primary) hypertension: Secondary | ICD-10-CM | POA: Diagnosis not present

## 2023-07-04 DIAGNOSIS — J441 Chronic obstructive pulmonary disease with (acute) exacerbation: Secondary | ICD-10-CM | POA: Diagnosis not present

## 2023-07-04 DIAGNOSIS — Z6821 Body mass index (BMI) 21.0-21.9, adult: Secondary | ICD-10-CM | POA: Diagnosis not present

## 2023-07-08 DIAGNOSIS — Z0100 Encounter for examination of eyes and vision without abnormal findings: Secondary | ICD-10-CM | POA: Diagnosis not present

## 2023-07-08 DIAGNOSIS — H524 Presbyopia: Secondary | ICD-10-CM | POA: Diagnosis not present

## 2023-07-12 ENCOUNTER — Telehealth: Payer: Self-pay

## 2023-07-12 NOTE — Telephone Encounter (Signed)
Transition Care Management Unsuccessful Follow-up Telephone Call  Date of discharge and from where:  [facility]  Attempts:  1st Attempt  Reason for unsuccessful TCM follow-up call:  No answer/busy   Lenard Forth [facility] Guide, [facility] [phone] [address] [place], [place], [place] [address] Phone: [phone] Email: [email or web address]

## 2023-07-15 ENCOUNTER — Telehealth: Payer: Self-pay

## 2023-07-15 NOTE — Telephone Encounter (Signed)
Transition Care Management Unsuccessful Follow-up Telephone Call  Date of discharge and from where:  Raymond Walker 7/17  Attempts:  2nd Attempt  Reason for unsuccessful TCM follow-up call:  No answer/busy

## 2023-07-16 ENCOUNTER — Ambulatory Visit (INDEPENDENT_AMBULATORY_CARE_PROVIDER_SITE_OTHER): Payer: Medicare HMO | Admitting: Family Medicine

## 2023-07-16 ENCOUNTER — Encounter: Payer: Self-pay | Admitting: Family Medicine

## 2023-07-16 VITALS — BP 124/74 | HR 99 | Temp 97.5°F | Ht 72.0 in | Wt 160.0 lb

## 2023-07-16 DIAGNOSIS — Z79899 Other long term (current) drug therapy: Secondary | ICD-10-CM

## 2023-07-16 DIAGNOSIS — R0609 Other forms of dyspnea: Secondary | ICD-10-CM | POA: Diagnosis not present

## 2023-07-16 DIAGNOSIS — I739 Peripheral vascular disease, unspecified: Secondary | ICD-10-CM

## 2023-07-16 DIAGNOSIS — M6281 Muscle weakness (generalized): Secondary | ICD-10-CM | POA: Diagnosis not present

## 2023-07-16 DIAGNOSIS — E785 Hyperlipidemia, unspecified: Secondary | ICD-10-CM

## 2023-07-16 DIAGNOSIS — J441 Chronic obstructive pulmonary disease with (acute) exacerbation: Secondary | ICD-10-CM

## 2023-07-16 MED ORDER — ALBUTEROL SULFATE HFA 108 (90 BASE) MCG/ACT IN AERS: INHALATION_SPRAY | RESPIRATORY_TRACT | 3 refills | Status: DC

## 2023-07-16 MED ORDER — BUDESONIDE-FORMOTEROL FUMARATE 160-4.5 MCG/ACT IN AERO: 2.0000 | INHALATION_SPRAY | Freq: Two times a day (BID) | RESPIRATORY_TRACT | 3 refills | Status: DC

## 2023-07-16 MED ORDER — AZELASTINE HCL 0.1 % NA SOLN: 2.0000 | Freq: Two times a day (BID) | NASAL | 12 refills | Status: DC

## 2023-07-16 MED ORDER — ROSUVASTATIN CALCIUM 5 MG PO TABS: 5.0000 mg | ORAL_TABLET | Freq: Every day | ORAL | 3 refills | Status: DC

## 2023-07-16 NOTE — Progress Notes (Signed)
   Subjective:    Patient ID: Raymond Butt., male    DOB: Mar 24, 1951, 72 y.o.   MRN: [id number]  HPI Hospital follow up for SOB and asthma flarae Sinus drainage and cough congestion- would like something for it  Went to ER and UC  ER note reviewed Records reviewed X-ray reviewed Labs reviewed Patient states he gets out of breath when he pushes himself but denies any chest tightness pressure pain denies numbness tingling no hemoptysis he still smokes he knows he needs to quit he also has a history of peripheral heart free disease but denies any claudication symptoms currently Review of Systems     Objective:   Physical Exam  General-in no acute distress Eyes-no discharge Lungs-respiratory rate normal, CTA CV-no murmurs,RRR Extremities skin warm dry no edema Neuro grossly normal Behavior normal, alert       Assessment & Plan:   1. COPD exacerbation (HCC) He was treated in the ER he is doing better now we will bump up the dose of the Symbicort to a stronger level 2 puffs twice daily rinse after use  2. Hyperlipidemia, unspecified hyperlipidemia type Patient stopped taking rosuvastatin we will reinitiate 5 mg daily and hopefully he will tolerate check lab work before follow-up visit - Lipid Panel  3. PAD (peripheral artery disease) (HCC) Patient was encouraged to quit smoking.  Denies any major symptoms currently  4. DOE (dyspnea on exertion) When he pushes himself he gets short of breath this is consistent with his COPD denies chest pain  5. High risk medication use Lab work ordered - Hepatic Function Panel  No antibiotics indicated today Astelin nasal spray for allergy symptoms Follow-up late September or early October for lab work and comprehensive check

## 2023-07-18 ENCOUNTER — Other Ambulatory Visit: Payer: Self-pay

## 2023-07-18 ENCOUNTER — Encounter: Payer: Self-pay | Admitting: Emergency Medicine

## 2023-07-18 ENCOUNTER — Ambulatory Visit
Admission: EM | Admit: 2023-07-18 | Discharge: 2023-07-18 | Disposition: A | Discharge status: Home / Self Care | Payer: Medicare HMO

## 2023-07-18 DIAGNOSIS — H1033 Unspecified acute conjunctivitis, bilateral: Secondary | ICD-10-CM | POA: Diagnosis not present

## 2023-07-18 DIAGNOSIS — M6281 Muscle weakness (generalized): Secondary | ICD-10-CM | POA: Diagnosis not present

## 2023-07-18 MED ORDER — POLYMYXIN B-TRIMETHOPRIM 10000-0.1 UNIT/ML-% OP SOLN: 1.0000 [drp] | Freq: Four times a day (QID) | OPHTHALMIC | 0 refills | Status: DC

## 2023-07-18 NOTE — ED Triage Notes (Addendum)
Pt resident of [facility] and presents to UC with caregiver.   Pt reports bilateral eye redness and "matted together in the morning" for last several days.

## 2023-07-18 NOTE — ED Provider Notes (Signed)
[facility]    CSN: [id number] Arrival date & time: 07/18/23  1047      History   Chief Complaint Chief Complaint  Patient presents with   Eye Pain    HPI Raymond Walker. is a 72 y.o. male.   Presenting today with several day history of bilateral eye redness, itching, thick drainage and matting.  Denies visual change, injury to the eye, chemical exposures, headaches, nausea, vomiting.  So far not trying anything over-the-counter for symptoms.    Past Medical History:  Diagnosis Date   Caregiver with fatigue 05/02/2020   COPD (chronic obstructive pulmonary disease) (HCC)    Deformity    equinus and metatarsal   Erectile dysfunction    Erectile dysfunction 05/02/2020   Full dentures    History of kidney stones    History of seizures 05/31/2020   Between ages 59 and 21   Hypertension    Impaired glucose tolerance    Pancreatitis, recurrent    Pneumonia    Thrombocytopenia (HCC) 08/10/2018   Staying in the low 100s will follow closely    Patient Active Problem List   Diagnosis Date Noted   Status post transmetatarsal amputation of foot, 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   Right ureteral stone 01/01/2022   Abdominal pain 01/01/2022   Sepsis secondary to UTI (HCC) 12/06/2021   Leukocytosis 12/06/2021   Hypoalbuminemia due to protein-calorie malnutrition (HCC) 12/06/2021   Lactic acidosis 12/06/2021   AKI (acute kidney injury) (HCC) 12/06/2021   Essential hypertension 12/06/2021   Mixed hyperlipidemia 12/06/2021   GERD (gastroesophageal reflux disease) 12/06/2021   Nephrolithiasis 11/21/2021   Low back pain 09/19/2021   Cognitive dysfunction 06/23/2020   History of seizures 05/31/2020   Erectile dysfunction 05/02/2020   Caregiver with fatigue 05/02/2020   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HCC)    Goals of care, counseling/discussion    Palliative care by specialist    DNR (do not resuscitate)    Vitamin D deficiency 01/24/2020   Sepsis (HCC) 01/23/2020    Altered mental status    Hypokalemia    History of kidney stones    Hematuria    UTI (urinary tract infection)    CHRONIC Bladder outlet obstruction    PAD (peripheral artery disease) (HCC) 09/15/2019   Protein-calorie malnutrition, severe 08/21/2019   Cellulitis of right lower extremity    Impaired glucose tolerance    COPD (chronic obstructive pulmonary disease) (HCC)    Thrombocytopenia (HCC) 08/10/2018   Aortic atherosclerosis (HCC) 10/14/2016   Coronary artery calcification seen on CAT scan 10/14/2016   Other emphysema (HCC) 10/14/2016   Elevated blood pressure 12/03/2011   Chest pain 12/03/2011   Tobacco abuse 12/03/2011   Right leg pain 12/03/2011    Past Surgical History:  Procedure Laterality Date   ABDOMINAL AORTOGRAM W/LOWER EXTREMITY Bilateral 08/20/2019   Procedure: ABDOMINAL AORTOGRAM W/LOWER EXTREMITY;  Surgeon: Cephus Shelling, MD;  Location: [facility] INVASIVE CV LAB;  Service: Cardiovascular;  Laterality: Bilateral;   ACHILLES TENDON SURGERY Right 08/26/2020   Procedure: ACHILLES TENDON LENGTHENING;  Surgeon: Edwin Cap, DPM;  Location: [facility] OR;  Service: Podiatry;  Laterality: Right;   AMPUTATION Right 09/15/2019   Procedure: AMPUTATION RIGHT TOES One, Two, And Three;  Surgeon: Maeola Harman, MD;  Location: [facility] OR;  Service: Vascular;  Laterality: Right;   APPENDECTOMY     CATARACT EXTRACTION W/PHACO Right 05/02/2018   Procedure: CATARACT EXTRACTION PHACO AND INTRAOCULAR LENS PLACEMENT (IOC);  Surgeon: Fabio Pierce, MD;  Location:  [facility];  Service: Ophthalmology;  Laterality: Right;  CDE: 11.11   CATARACT EXTRACTION W/PHACO Left 07/04/2018   Procedure: CATARACT EXTRACTION PHACO AND INTRAOCULAR LENS PLACEMENT (IOC);  Surgeon: Fabio Pierce, MD;  Location: [facility] ORS;  Service: Ophthalmology;  Laterality: Left;  CDE: 7.15   CHOLECYSTECTOMY     CYSTOSCOPY W/ URETERAL STENT PLACEMENT Right 11/22/2021   Procedure: CYSTOSCOPY WITH RETROGRADE PYELOGRAM/URETERAL  STENT PLACEMENT;  Surgeon: Marcine Matar, MD;  Location: [facility] ORS;  Service: Urology;  Laterality: Right;   CYSTOSCOPY WITH RETROGRADE PYELOGRAM, URETEROSCOPY AND STENT PLACEMENT Right 02/01/2022   Procedure: CYSTOSCOPY WITH RETROGRADE PYELOGRAM, URETEROSCOPY AND STENT PLACEMENT;  Surgeon: Malen Gauze, MD;  Location: [facility] ORS;  Service: Urology;  Laterality: Right;   FEMORAL-POPLITEAL BYPASS GRAFT Right 09/15/2019   Procedure: Right BYPASS GRAFT FEMORAL to Above Knee POPLITEAL ARTERY;  Surgeon: Maeola Harman, MD;  Location: [facility] OR;  Service: Vascular;  Laterality: Right;   FEMORAL-POPLITEAL BYPASS GRAFT Right 01/26/2020   Procedure: IRRIGATION AND DEBRIDEMENT RIGHT FEMORAL POPLITEAL BYPASS SITE;  Surgeon: Maeola Harman, MD;  Location: [facility] OR;  Service: Vascular;  Laterality: Right;   HOLMIUM LASER APPLICATION Right 02/01/2022   Procedure: HOLMIUM LASER APPLICATION;  Surgeon: Malen Gauze, MD;  Location: [facility]S;  Service: Urology;  Laterality: Right;   MULTIPLE TOOTH EXTRACTIONS     ORCHIECTOMY     PERIPHERAL VASCULAR INTERVENTION Left 08/20/2019   Procedure: PERIPHERAL VASCULAR INTERVENTION;  Surgeon: Cephus Shelling, MD;  [facility] INV[facility] LAB;  Service: Cardiovascular;  Laterality: Left;  common/external iliac   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   Procedure: TRANSMETATARSAL AMPUTATION;  Surgeon: Maeola Harman, MD;  Location: [facility] OR;  Service: Vascular;  Laterality: Right;   TRANSMETATARSAL AMPUTATION Right 08/26/2020   Procedure: TRANSMETATARSAL AMPUTATION REVISION;  Surgeon: Edwin Cap, DPM;  [facility] OR;  Service: Podiatry;  Laterality: Right;   VASECTOMY     WOUND DEBRIDEMENT Right 08/26/2020   Procedure: EXCISION WOUND;  Surgeon: Edwin Cap, DPM;  Location: [facility] OR;  Service: Podiatry;  Laterality: Right;       Home Medications    Prior to Admission medications   Medication Sig Start Date End Date Taking?  Authorizing Provider  azithromycin (ZITHROMAX) 250 MG tablet Take by mouth daily.   Yes [provider]  trimethoprim-polymyxin b (POLYTRIM) ophthalmic solution Place 1 drop into both eyes every 6 (six) hours. 07/18/23  Yes Particia Nearing, PA-C  albuterol (VENTOLIN HFA) 108 (90 Base) MCG/ACT inhaler INHALE 2 PUFFS INTO THE LUNGS EVERY 4 HOURS AS NEEDED FOR WHEEZING OR SHORTNESS OF BREATH 08/09/21   Babs Sciara, MD  albuterol (VENTOLIN HFA) 108 (90 Base) MCG/ACT inhaler 2 puffs every 4 hours as needed for rescue inhaler 07/16/23   Luking, Jonna Coup, MD  azelastine (ASTELIN) 0.1 % nasal spray Place 2 sprays into both nostrils 2 (two) times daily. 07/16/23   Babs Sciara, MD  budesonide-formoterol (SYMBICORT) 160-4.5 MCG/ACT inhaler Inhale 2 puffs into the lungs 2 (two) times daily. 07/16/23   Babs Sciara, MD  pantoprazole (PROTONIX) 40 MG tablet TAKE (1) TABLET BY MOUTH ONCE DAILY. 03/14/23   Babs Sciara, MD  rosuvastatin (CRESTOR) 5 MG tablet Take 1 tablet (5 mg total) by mouth daily. 07/16/23   Babs Sciara, MD    Family History Family History  Problem Relation Age of Onset   Hypertension Father    Coronary artery disease Father    Coronary artery disease  Mother    Cancer Mother        Breast    Social History Social History   Tobacco Use   Smoking status: Every Day    Current packs/day: 1.00    Average packs/day: 1 pack/day for 30.0 years (30.0 ttl pk-yrs)    Types: Cigarettes   Smokeless tobacco: Former    Types: Engineer, drilling   Vaping status: Never Used  Substance Use Topics   Alcohol use: No   Drug use: No     Allergies   Patient has no known allergies.   Review of Systems Review of Systems PER HPI  Physical Exam Triage Vital Signs ED Triage Vitals  Encounter Vitals Group     BP 07/18/23 1055 127/84     Systolic BP Percentile --      Diastolic BP Percentile --      Pulse Rate 07/18/23 1055 100     Resp 07/18/23 1055 (!) 22      Temp 07/18/23 1055 98 F (36.7 C)     Temp Source 07/18/23 1055 Oral     SpO2 07/18/23 1055 95 %     Weight --      Height --      Head Circumference --      Peak Flow --      Pain Score 07/18/23 1053 0     Pain Loc --      Pain Education --      Exclude from Growth Chart --    No data found.  Updated Vital Signs BP 127/84 (BP Location: Right Arm)   Pulse 100   Temp 98 F (36.7 C) (Oral)   Resp (!) 22   SpO2 95%   Visual Acuity Right Eye Distance:   Left Eye Distance:   Bilateral Distance:    Right Eye Near:   Left Eye Near:    Bilateral Near:     Physical Exam Vitals and nursing note reviewed.  Constitutional:      Appearance: Normal appearance.  HENT:     Head: Atraumatic.     Nose: Nose normal.     Mouth/Throat:     Mouth: Mucous membranes are moist.  Eyes:     General:        Right eye: Discharge present.        Left eye: Discharge present.    Extraocular Movements: Extraocular movements intact.     Pupils: Pupils are equal, round, and reactive to light.     Comments: Bilateral conjunctival erythema, injection, thick drainage  Cardiovascular:     Rate and Rhythm: Normal rate and regular rhythm.  Pulmonary:     Effort: Pulmonary effort is normal.     Breath sounds: Normal breath sounds.  Musculoskeletal:        General: Normal range of motion.     Cervical back: Normal range of motion and neck supple.  Skin:    General: Skin is warm and dry.  Neurological:     General: No focal deficit present.     Mental Status: He is oriented to person, place, and time.  Psychiatric:        Mood and Affect: Mood normal.        Thought Content: Thought content normal.        Judgment: Judgment normal.    UC Treatments / Results  Labs (all labs ordered are listed, but only abnormal results are displayed) Labs Reviewed - No data to  display  EKG   Radiology No results found.  Procedures Procedures (including critical care time)  Medications Ordered in  UC Medications - No data to display  Initial Impression / Assessment and Plan / UC Course  I have reviewed the triage vital signs and the nursing notes.  Pertinent labs & imaging results that were available during my care of the patient were reviewed by me and considered in my medical decision making (see chart for details).     Treat with Polytrim drops, good handwashing, warm compresses.  Return for worsening symptoms.  Visual acuity declined as vision intact per patient.  Final Clinical Impressions(s) / UC Diagnoses   Final diagnoses:  Acute bacterial conjunctivitis of both eyes   Discharge Instructions   None    ED Prescriptions     Medication Sig Dispense Auth. Provider   trimethoprim-polymyxin b (POLYTRIM) ophthalmic solution Place 1 drop into both eyes every 6 (six) hours. 10 mL Particia Nearing, [place]      PDMP not reviewed this encounter.   Particia Nearing, [place] 07/18/23 1119

## 2023-07-22 DIAGNOSIS — M6281 Muscle weakness (generalized): Secondary | ICD-10-CM | POA: Diagnosis not present

## 2023-08-22 ENCOUNTER — Other Ambulatory Visit: Payer: Self-pay

## 2023-08-22 ENCOUNTER — Emergency Department (HOSPITAL_COMMUNITY): Payer: Medicare HMO

## 2023-08-22 ENCOUNTER — Encounter (HOSPITAL_COMMUNITY): Payer: Self-pay | Admitting: Emergency Medicine

## 2023-08-22 ENCOUNTER — Inpatient Hospital Stay (HOSPITAL_COMMUNITY)
Admission: EM | Admit: 2023-08-22 | Discharge: 2023-08-30 | DRG: 629 | Disposition: A | Discharge status: Home Health | Payer: Medicare HMO | Source: Skilled Nursing Facility | Attending: Internal Medicine | Admitting: Internal Medicine

## 2023-08-22 DIAGNOSIS — E1169 Type 2 diabetes mellitus with other specified complication: Secondary | ICD-10-CM | POA: Diagnosis present

## 2023-08-22 DIAGNOSIS — E538 Deficiency of other specified B group vitamins: Secondary | ICD-10-CM | POA: Diagnosis present

## 2023-08-22 DIAGNOSIS — Z9079 Acquired absence of other genital organ(s): Secondary | ICD-10-CM

## 2023-08-22 DIAGNOSIS — Z95820 Peripheral vascular angioplasty status with implants and grafts: Secondary | ICD-10-CM

## 2023-08-22 DIAGNOSIS — I10 Essential (primary) hypertension: Secondary | ICD-10-CM | POA: Diagnosis present

## 2023-08-22 DIAGNOSIS — M86172 Other acute osteomyelitis, left ankle and foot: Secondary | ICD-10-CM | POA: Diagnosis present

## 2023-08-22 DIAGNOSIS — E1151 Type 2 diabetes mellitus with diabetic peripheral angiopathy without gangrene: Secondary | ICD-10-CM | POA: Diagnosis present

## 2023-08-22 DIAGNOSIS — Z961 Presence of intraocular lens: Secondary | ICD-10-CM | POA: Diagnosis present

## 2023-08-22 DIAGNOSIS — Z9842 Cataract extraction status, left eye: Secondary | ICD-10-CM

## 2023-08-22 DIAGNOSIS — E11649 Type 2 diabetes mellitus with hypoglycemia without coma: Secondary | ICD-10-CM | POA: Diagnosis not present

## 2023-08-22 DIAGNOSIS — E11621 Type 2 diabetes mellitus with foot ulcer: Secondary | ICD-10-CM | POA: Diagnosis present

## 2023-08-22 DIAGNOSIS — Z7951 Long term (current) use of inhaled steroids: Secondary | ICD-10-CM

## 2023-08-22 DIAGNOSIS — I7090 Unspecified atherosclerosis: Secondary | ICD-10-CM | POA: Diagnosis not present

## 2023-08-22 DIAGNOSIS — Z89431 Acquired absence of right foot: Secondary | ICD-10-CM | POA: Diagnosis not present

## 2023-08-22 DIAGNOSIS — E785 Hyperlipidemia, unspecified: Secondary | ICD-10-CM | POA: Diagnosis present

## 2023-08-22 DIAGNOSIS — I708 Atherosclerosis of other arteries: Secondary | ICD-10-CM | POA: Diagnosis present

## 2023-08-22 DIAGNOSIS — Z87442 Personal history of urinary calculi: Secondary | ICD-10-CM

## 2023-08-22 DIAGNOSIS — Z66 Do not resuscitate: Secondary | ICD-10-CM | POA: Diagnosis present

## 2023-08-22 DIAGNOSIS — Z7982 Long term (current) use of aspirin: Secondary | ICD-10-CM

## 2023-08-22 DIAGNOSIS — Z9889 Other specified postprocedural states: Secondary | ICD-10-CM

## 2023-08-22 DIAGNOSIS — D62 Acute posthemorrhagic anemia: Secondary | ICD-10-CM | POA: Diagnosis not present

## 2023-08-22 DIAGNOSIS — Z862 Personal history of diseases of the blood and blood-forming organs and certain disorders involving the immune mechanism: Secondary | ICD-10-CM

## 2023-08-22 DIAGNOSIS — K219 Gastro-esophageal reflux disease without esophagitis: Secondary | ICD-10-CM | POA: Diagnosis present

## 2023-08-22 DIAGNOSIS — L97529 Non-pressure chronic ulcer of other part of left foot with unspecified severity: Secondary | ICD-10-CM | POA: Diagnosis present

## 2023-08-22 DIAGNOSIS — Z8701 Personal history of pneumonia (recurrent): Secondary | ICD-10-CM

## 2023-08-22 DIAGNOSIS — Z7902 Long term (current) use of antithrombotics/antiplatelets: Secondary | ICD-10-CM

## 2023-08-22 DIAGNOSIS — L97519 Non-pressure chronic ulcer of other part of right foot with unspecified severity: Secondary | ICD-10-CM | POA: Diagnosis present

## 2023-08-22 DIAGNOSIS — L03116 Cellulitis of left lower limb: Secondary | ICD-10-CM | POA: Diagnosis present

## 2023-08-22 DIAGNOSIS — M8610 Other acute osteomyelitis, unspecified site: Secondary | ICD-10-CM | POA: Diagnosis not present

## 2023-08-22 DIAGNOSIS — M869 Osteomyelitis, unspecified: Principal | ICD-10-CM

## 2023-08-22 DIAGNOSIS — Z5941 Food insecurity: Secondary | ICD-10-CM

## 2023-08-22 DIAGNOSIS — M25475 Effusion, left foot: Secondary | ICD-10-CM | POA: Diagnosis not present

## 2023-08-22 DIAGNOSIS — D649 Anemia, unspecified: Secondary | ICD-10-CM | POA: Diagnosis present

## 2023-08-22 DIAGNOSIS — Z8249 Family history of ischemic heart disease and other diseases of the circulatory system: Secondary | ICD-10-CM

## 2023-08-22 DIAGNOSIS — M009 Pyogenic arthritis, unspecified: Secondary | ICD-10-CM | POA: Diagnosis present

## 2023-08-22 DIAGNOSIS — I739 Peripheral vascular disease, unspecified: Secondary | ICD-10-CM | POA: Diagnosis not present

## 2023-08-22 DIAGNOSIS — J449 Chronic obstructive pulmonary disease, unspecified: Secondary | ICD-10-CM | POA: Diagnosis present

## 2023-08-22 DIAGNOSIS — M00872 Arthritis due to other bacteria, left ankle and foot: Secondary | ICD-10-CM | POA: Diagnosis not present

## 2023-08-22 DIAGNOSIS — I70321 Atherosclerosis of unspecified type of bypass graft(s) of the extremities with rest pain, right leg: Secondary | ICD-10-CM | POA: Diagnosis present

## 2023-08-22 DIAGNOSIS — E876 Hypokalemia: Secondary | ICD-10-CM | POA: Diagnosis not present

## 2023-08-22 DIAGNOSIS — Z9049 Acquired absence of other specified parts of digestive tract: Secondary | ICD-10-CM

## 2023-08-22 DIAGNOSIS — Z8719 Personal history of other diseases of the digestive system: Secondary | ICD-10-CM

## 2023-08-22 DIAGNOSIS — F17210 Nicotine dependence, cigarettes, uncomplicated: Secondary | ICD-10-CM | POA: Diagnosis present

## 2023-08-22 DIAGNOSIS — I70222 Atherosclerosis of native arteries of extremities with rest pain, left leg: Secondary | ICD-10-CM | POA: Diagnosis not present

## 2023-08-22 DIAGNOSIS — I70245 Atherosclerosis of native arteries of left leg with ulceration of other part of foot: Secondary | ICD-10-CM | POA: Diagnosis present

## 2023-08-22 DIAGNOSIS — Z803 Family history of malignant neoplasm of breast: Secondary | ICD-10-CM

## 2023-08-22 DIAGNOSIS — Z5986 Financial insecurity: Secondary | ICD-10-CM

## 2023-08-22 DIAGNOSIS — Z79899 Other long term (current) drug therapy: Secondary | ICD-10-CM

## 2023-08-22 DIAGNOSIS — Z8669 Personal history of other diseases of the nervous system and sense organs: Secondary | ICD-10-CM

## 2023-08-22 DIAGNOSIS — Z9841 Cataract extraction status, right eye: Secondary | ICD-10-CM

## 2023-08-22 DIAGNOSIS — E11628 Type 2 diabetes mellitus with other skin complications: Secondary | ICD-10-CM | POA: Diagnosis not present

## 2023-08-22 LAB — LACTIC ACID, PLASMA: Lactic Acid, Venous: 1.8 mmol/L (ref 0.5–1.9)

## 2023-08-22 LAB — CBC WITH DIFFERENTIAL/PLATELET
Abs Immature Granulocytes: 0.06 10*3/uL (ref 0.00–0.07)
Basophils Absolute: 0.1 10*3/uL (ref 0.0–0.1)
Basophils Relative: 1 %
Eosinophils Absolute: 0.1 10*3/uL (ref 0.0–0.5)
Eosinophils Relative: 1 %
HCT: 40.9 % (ref 39.0–52.0)
Hemoglobin: 13.4 g/dL (ref 13.0–17.0)
Immature Granulocytes: 1 %
Lymphocytes Relative: 13 %
Lymphs Abs: 1.5 10*3/uL (ref 0.7–4.0)
MCH: 29.3 pg (ref 26.0–34.0)
MCHC: 32.8 g/dL (ref 30.0–36.0)
MCV: 89.5 fL (ref 80.0–100.0)
Monocytes Absolute: 1.3 10*3/uL — ABNORMAL HIGH (ref 0.1–1.0)
Monocytes Relative: 12 %
Neutro Abs: 8.1 10*3/uL — ABNORMAL HIGH (ref 1.7–7.7)
Neutrophils Relative %: 72 %
Platelets: 199 10*3/uL (ref 150–400)
RBC: 4.57 MIL/uL (ref 4.22–5.81)
RDW: 14.3 % (ref 11.5–15.5)
WBC: 11 10*3/uL — ABNORMAL HIGH (ref 4.0–10.5)
nRBC: 0 % (ref 0.0–0.2)

## 2023-08-22 LAB — COMPREHENSIVE METABOLIC PANEL
ALT: 21 U/L (ref 0–44)
AST: 15 U/L (ref 15–41)
Albumin: 3.3 g/dL — ABNORMAL LOW (ref 3.5–5.0)
Alkaline Phosphatase: 68 U/L (ref 38–126)
Anion gap: 12 (ref 5–15)
BUN: 17 mg/dL (ref 8–23)
CO2: 28 mmol/L (ref 22–32)
Calcium: 8.8 mg/dL — ABNORMAL LOW (ref 8.9–10.3)
Chloride: 98 mmol/L (ref 98–111)
Creatinine, Ser: 1.17 mg/dL (ref 0.61–1.24)
GFR, Estimated: >60 mL/min (ref >60)
Glucose, Bld: 115 mg/dL — ABNORMAL HIGH (ref 70–99)
Potassium: 4.1 mmol/L (ref 3.5–5.1)
Sodium: 138 mmol/L (ref 135–145)
Total Bilirubin: 0.6 mg/dL (ref 0.3–1.2)
Total Protein: 7.5 g/dL (ref 6.5–8.1)

## 2023-08-22 LAB — SEDIMENTATION RATE
Sed Rate: 76 mm/h — ABNORMAL HIGH (ref 0–16)
Sed Rate: 31 mm/h — ABNORMAL HIGH (ref 0–16)

## 2023-08-22 LAB — HEMOGLOBIN A1C
Hgb A1c MFr Bld: 6.1 % — ABNORMAL HIGH (ref 4.8–5.6)
Mean Plasma Glucose: 128.37 mg/dL

## 2023-08-22 LAB — C-REACTIVE PROTEIN: CRP: 10.3 mg/dL — ABNORMAL HIGH (ref <1.0)

## 2023-08-22 MED ORDER — MOMETASONE FURO-FORMOTEROL FUM 200-5 MCG/ACT IN AERO
2.0000 | INHALATION_SPRAY | Freq: Two times a day (BID) | RESPIRATORY_TRACT | Status: DC
  Administered 2023-08-22 – 2023-08-30 (×11): 2 via RESPIRATORY_TRACT
  Filled 2023-08-22 (×3): qty 8.8

## 2023-08-22 MED ORDER — POLYMYXIN B-TRIMETHOPRIM 10000-0.1 UNIT/ML-% OP SOLN
1.0000 [drp] | Freq: Four times a day (QID) | OPHTHALMIC | Status: DC
  Administered 2023-08-27 – 2023-08-28 (×5): 1 [drp] via OPHTHALMIC
  Filled 2023-08-22 (×3): qty 10

## 2023-08-22 MED ORDER — ONDANSETRON HCL 4 MG/2ML IJ SOLN
4.0000 mg | Freq: Four times a day (QID) | INTRAMUSCULAR | Status: DC | PRN
  Administered 2023-08-28: 4 mg via INTRAVENOUS
  Filled 2023-08-22: qty 2

## 2023-08-22 MED ORDER — POLYETHYLENE GLYCOL 3350 17 G PO PACK: 17.0000 g | PACK | Freq: Every day | ORAL | Status: DC | PRN

## 2023-08-22 MED ORDER — AZELASTINE HCL 0.1 % NA SOLN
2.0000 | Freq: Two times a day (BID) | NASAL | Status: DC
  Administered 2023-08-27 – 2023-08-29 (×3): 2 via NASAL
  Filled 2023-08-22 (×2): qty 30

## 2023-08-22 MED ORDER — ACETAMINOPHEN 325 MG PO TABS
650.0000 mg | ORAL_TABLET | Freq: Four times a day (QID) | ORAL | Status: DC | PRN
  Filled 2023-08-22: qty 2

## 2023-08-22 MED ORDER — SODIUM CHLORIDE 0.9 % IV SOLN
2.0000 g | INTRAVENOUS | Status: DC
  Administered 2023-08-22: 2 g via INTRAVENOUS
  Filled 2023-08-22: qty 20

## 2023-08-22 MED ORDER — ONDANSETRON HCL 4 MG PO TABS: 4.0000 mg | ORAL_TABLET | Freq: Four times a day (QID) | ORAL | Status: DC | PRN

## 2023-08-22 MED ORDER — ROSUVASTATIN CALCIUM 5 MG PO TABS
5.0000 mg | ORAL_TABLET | Freq: Every day | ORAL | Status: DC
  Administered 2023-08-23 – 2023-08-28 (×6): 5 mg via ORAL
  Filled 2023-08-22 (×6): qty 1

## 2023-08-22 MED ORDER — PANTOPRAZOLE SODIUM 40 MG PO TBEC
40.0000 mg | DELAYED_RELEASE_TABLET | Freq: Every day | ORAL | Status: DC
  Administered 2023-08-23 – 2023-08-30 (×8): 40 mg via ORAL
  Filled 2023-08-22 (×8): qty 1

## 2023-08-22 MED ORDER — METRONIDAZOLE 500 MG PO TABS: 500.0000 mg | ORAL_TABLET | Freq: Two times a day (BID) | ORAL | Status: DC

## 2023-08-22 MED ORDER — ALBUTEROL SULFATE (2.5 MG/3ML) 0.083% IN NEBU: 2.5000 mg | INHALATION_SOLUTION | RESPIRATORY_TRACT | Status: DC | PRN

## 2023-08-22 MED ORDER — ACETAMINOPHEN 650 MG RE SUPP: 650.0000 mg | Freq: Four times a day (QID) | RECTAL | Status: DC | PRN

## 2023-08-22 NOTE — Assessment & Plan Note (Signed)
Stable.  Resume home regimen 

## 2023-08-22 NOTE — ED Triage Notes (Signed)
Pt has wound to the left inner foot. Pt denies fevers, nausea, and vomiting.

## 2023-08-22 NOTE — H&P (Signed)
History and Physical    Raymond Walker. EAV:[id number] DOB: [birth year] DOA: 08/22/2023  PCP: Babs Sciara, MD   Patient coming from: Home  I have personally briefly reviewed patient's old medical records in [facility]  Chief Complaint: Left leg pain and redness  HPI: Raymond Walker. is a 72 y.o. male with medical history significant for peripheral artery disease, hypertension, COPD, seizures. Patient presented to the ED with complaints of his wounds to his left foot, with increasing pain and redness.  He noticed a wound about 2 weeks ago, there has been no drainage. He denies any fever or chills.  No nausea or vomiting.  He otherwise denies any other complaints, but he is upset that he has been in the ED for 5 hours now.  ED Course: Tmax 98.8.  Heart rate 78-95.  Blood pressure systolic 101-165. X-ray of the left foot shows osteomyelitis involving the first metatarsal head, base of the first proximal phalanx. Lactic 1.8, WBC 11. IV antibiotics ceftriaxone and metronidazole given EDP talked to podiatry Dr. Ardelle Anton- recommend admission to [facility] or [facility], will see in consult.  Review of Systems: As per HPI all other systems reviewed and negative.  Past Medical History:  Diagnosis Date   Caregiver with fatigue 05/02/2020   COPD (chronic obstructive pulmonary disease) (HCC)    Deformity    equinus and metatarsal   Erectile dysfunction    Erectile dysfunction 05/02/2020   Full dentures    History of kidney stones    History of seizures 05/31/2020   Between ages 26 and 43   Hypertension    Impaired glucose tolerance    Pancreatitis, recurrent    Pneumonia    Thrombocytopenia (HCC) 08/10/2018   Staying in the low 100s will follow closely    Past Surgical History:  Procedure Laterality Date   ABDOMINAL AORTOGRAM W/LOWER EXTREMITY Bilateral 08/20/2019   Procedure: ABDOMINAL AORTOGRAM W/LOWER EXTREMITY;  Surgeon: Cephus Shelling, MD;  Location: [facility] INVASIVE CV LAB;   Service: Cardiovascular;  Laterality: Bilateral;   ACHILLES TENDON SURGERY Right 08/26/2020   Procedure: ACHILLES TENDON LENGTHENING;  Surgeon: Edwin Cap, DPM;  Location: [facility] OR;  Service: Podiatry;  Laterality: Right;   AMPUTATION Right 09/15/2019   Procedure: AMPUTATION RIGHT TOES One, Two, And Three;  Surgeon: Maeola Harman, MD;  Location: [facility] OR;  Service: Vascular;  Laterality: Right;   APPENDECTOMY     CATARACT EXTRACTION W/PHACO Right 05/02/2018   Procedure: CATARACT EXTRACTION PHACO AND INTRAOCULAR LENS PLACEMENT (IOC);  Surgeon: Fabio Pierce, MD;  Location: [facility] ORS;  Service: Ophthalmology;  Laterality: Right;  CDE: 11.11   CATARACT EXTRACTION W/PHACO Left 07/04/2018   Procedure: CATARACT EXTRACTION PHACO AND INTRAOCULAR LENS PLACEMENT (IOC);  Surgeon: Fabio Pierce, MD;  Location: [facility] ORS;  Service: Ophthalmology;  Laterality: Left;  CDE: 7.15   CHOLECYSTECTOMY     CYSTOSCOPY W/ URETERAL STENT PLACEMENT Right 11/22/2021   Procedure: CYSTOSCOPY WITH RETROGRADE PYELOGRAM/URETERAL STENT PLACEMENT;  Surgeon: Marcine Matar, MD;  Location: [facility] ORS;  Service: Urology;  Laterality: Right;   CYSTOSCOPY WITH RETROGRADE PYELOGRAM, URETEROSCOPY AND STENT PLACEMENT Right 02/01/2022   Procedure: CYSTOSCOPY WITH RETROGRADE PYELOGRAM, URETEROSCOPY AND STENT PLACEMENT;  Surgeon: Malen Gauze, MD;  Location: [facility] ORS;  Service: Urology;  Laterality: Right;   FEMORAL-POPLITEAL BYPASS GRAFT Right 09/15/2019   Procedure: Right BYPASS GRAFT FEMORAL to Above Knee POPLITEAL ARTERY;  Surgeon: Maeola Harman, MD;  Location: [facility] OR;  Service: Vascular;  Laterality:  Right;   FEMORAL-POPLITEAL BYPASS GRAFT Right 01/26/2020   Procedure: IRRIGATION AND DEBRIDEMENT RIGHT FEMORAL POPLITEAL BYPASS SITE;  Surgeon: Maeola Harman, MD;  Location: [facility] OR;  Service: Vascular;  Laterality: Right;   HOLMIUM LASER APPLICATION Right 02/01/2022   Procedure: HOLMIUM LASER APPLICATION;  Surgeon:  Malen Gauze, MD;  Location: [facility] ORS;  Service: Urology;  Laterality: Right;   MULTIPLE TOOTH EXTRACTIONS     ORCHIECTOMY     PERIPHERAL VASCULAR INTERVENTION Left 08/20/2019   Procedure: PERIPHERAL VASCULAR INTERVENTION;  Surgeon: Cephus Shelling, MD;  Location: [facility] INVASIVE CV LAB;  Service: Cardiovascular;  Laterality: Left;  common/external iliac   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   Procedure: TRANSMETATARSAL AMPUTATION;  Surgeon: Maeola Harman, MD;  Location: [facility] OR;  Service: Vascular;  Laterality: Right;   TRANSMETATARSAL AMPUTATION Right 08/26/2020   Procedure: TRANSMETATARSAL AMPUTATION REVISION;  Surgeon: Edwin Cap, DPM;  Location: [facility] OR;  Service: Podiatry;  Laterality: Right;   VASECTOMY     WOUND DEBRIDEMENT Right 08/26/2020   Procedure: EXCISION WOUND;  Surgeon: Edwin Cap, DPM;  Location: [facility] OR;  Service: Podiatry;  Laterality: Right;     reports that he has been smoking cigarettes. He has a 30 pack-year smoking history. He has quit using smokeless tobacco.  His smokeless tobacco use included chew. He reports that he does not drink alcohol and does not use drugs.  No Known Allergies  Family History  Problem Relation Age of Onset   Hypertension Father    Coronary artery disease Father    Coronary artery disease Mother    Cancer Mother        Breast    Prior to Admission medications   Medication Sig Start Date End Date Taking? Authorizing Provider  albuterol (VENTOLIN HFA) 108 (90 Base) MCG/ACT inhaler INHALE 2 PUFFS INTO THE LUNGS EVERY 4 HOURS AS NEEDED FOR WHEEZING OR SHORTNESS OF BREATH 08/09/21   Babs Sciara, MD  albuterol (VENTOLIN HFA) 108 (90 Base) MCG/ACT inhaler 2 puffs every 4 hours as needed for rescue inhaler 07/16/23   Luking, Scott A, MD  azelastine (ASTELIN) 0.1 % nasal spray Place 2 sprays into both nostrils 2 (two) times daily. 07/16/23   Babs Sciara, MD  azithromycin (ZITHROMAX) 250 MG tablet Take by mouth  daily.    [provider]  budesonide-formoterol (SYMBICORT) 160-4.5 MCG/ACT inhaler Inhale 2 puffs into the lungs 2 (two) times daily. 07/16/23   Babs Sciara, MD  pantoprazole (PROTONIX) 40 MG tablet TAKE (1) TABLET BY MOUTH ONCE DAILY. 03/14/23   Babs Sciara, MD  rosuvastatin (CRESTOR) 5 MG tablet Take 1 tablet (5 mg total) by mouth daily. 07/16/23   Babs Sciara, MD  trimethoprim-polymyxin b (POLYTRIM) ophthalmic solution Place 1 drop into both eyes every 6 (six) hours. 07/18/23   Particia Nearing, PA-C    Physical Exam: Vitals:   08/22/23 1450 08/22/23 1745 08/22/23 1800  BP: (!) 164/91 115/87   Pulse: 92 88   Resp: 17 18   Temp: 98.8 F (37.1 C) 98.8 F (37.1 C)   TempSrc: Oral Oral   SpO2: 100% 96% 96%    Constitutional: NAD, calm, comfortable Vitals:   08/22/23 1450 08/22/23 1745 08/22/23 1800  BP: (!) 164/91 115/87   Pulse: 92 88   Resp: 17 18   Temp: 98.8 F (37.1 C) 98.8 F (37.1 C)   TempSrc: Oral Oral   SpO2: 100% 96% 96%   Eyes: PERRL,  lids and conjunctivae normal ENMT: Mucous membranes are moist.   Neck: normal, supple, no masses, no thyromegaly Respiratory: clear to auscultation bilaterally, no wheezing, no crackles. Normal respiratory effort. No accessory muscle use.  Cardiovascular: Regular rate and rhythm, no murmurs / rubs / gallops. No extremity edema.  Abdomen: no tenderness, no masses palpated. No hepatosplenomegaly. Bowel sounds positive.  Musculoskeletal: no clubbing / cyanosis. No joint deformity upper and lower extremities.  Skin: Small circular wound to mid-inner aspect of left lower extremity, with surrounding erythema, no drainage present Neurologic: No facial symmetry, moving extremities spontaneously.Marland Kitchen  Psychiatric: Normal judgment and insight. Alert and oriented x 3. Normal mood.        Labs on Admission: I have personally reviewed following labs and imaging studies  CBC: Recent Labs  Lab 08/22/23 1736  WBC  11.0*  NEUTROABS 8.1*  HGB 13.4  HCT 40.9  MCV 89.5  PLT 199   Basic Metabolic Panel: Recent Labs  Lab 08/22/23 1736  NA 138  K 4.1  CL 98  CO2 28  GLUCOSE 115*  BUN 17  CREATININE 1.17  CALCIUM 8.8*   GFR: CrCl cannot be calculated (Unknown ideal weight.). Liver Function Tests: Recent Labs  Lab 08/22/23 1736  AST 15  ALT 21  ALKPHOS 68  BILITOT 0.6  PROT 7.5  ALBUMIN 3.3*    Radiological Exams on Admission: DG Foot Complete Left  Result Date: 08/22/2023 CLINICAL DATA:  Left inner foot wound. Previous right foot partial amputation. EXAM: LEFT FOOT - COMPLETE 3+ VIEW COMPARISON:  None Available. FINDINGS: Shallow soft tissue ulceration in the medial aspect of the left foot at the level of the 1st MTP joint. Bone destruction involving the medial aspect of the 1st metatarsal head and medial aspect of the base of the 1st proximal phalanx. No soft tissue gas, fracture or dislocation. IMPRESSION: Osteomyelitis involving the medial aspect of the 1st metatarsal head and medial aspect of the base of the 1st proximal phalanx with an overlying soft tissue ulceration. Electronically Signed   By: Beckie Salts M.D.   On: 08/22/2023 16:27    EKG: none   Assessment/Plan Principal Problem:   Osteomyelitis of foot, left, acute (HCC) Active Problems:   COPD (chronic obstructive pulmonary disease) (HCC)   PAD (peripheral artery disease) (HCC)   Essential hypertension   Status post transmetatarsal amputation of foot, right (HCC)   Assessment and Plan: * Osteomyelitis of foot, left, acute (HCC) 2/2 small wound to inner aspect of left foot, with mild surrounding cellulitis.  He rules out for sepsis.  Afebrile.  WBC 11.  X-ray showing - Osteomyelitis involving the medial aspect of the 1st metatarsal head and medial aspect of the base of the 1st proximal phalanx with an overlying soft tissue ulceration.  History of transmetatarsal amputation of the right foot. - EDP talked to Dr.  Amedeo Plenty to [facility] or [facility] -IV ceftriaxone metronidazole given in ED, hold off on further antibiotics at this time pending podiatry evaluation -ESR, CRP - Hgba1c -Follow-up blood cultures obtained in ED -History of peripheral artery disease, will defer further imaging/workup to podiatry -N.p.o. midnight  Essential hypertension Stable.  Not on medication.  COPD (chronic obstructive pulmonary disease) (HCC) Stable. -Resume home regimen   DVT prophylaxis: SCDS for now, pending podiatry eval Code Status: DNR-confirmed with patient at bedside, consistent with prior documentation.  ACP documents reviewed Family Communication: Friend at bedside- Hassie Bruce Disposition Plan: > 2 days Consults called: Podiatry Admission status: Inpt  Med surg I certify that at the point of admission it is my clinical judgment that the patient will require inpatient hospital care spanning beyond 2 midnights from the point of admission due to high intensity of service, high risk for further deterioration and high frequency of surveillance required.   Author: Onnie Boer, MD 08/22/2023 8:45 PM  For on call review [email or web address].

## 2023-08-22 NOTE — Assessment & Plan Note (Addendum)
2/2 small wound to inner aspect of left foot, with mild surrounding cellulitis.  He rules out for sepsis.  Afebrile.  WBC 11.  X-ray showing - Osteomyelitis involving the medial aspect of the 1st metatarsal head and medial aspect of the base of the 1st proximal phalanx with an overlying soft tissue ulceration.  History of transmetatarsal amputation of the right foot. - EDP talked to Dr. Amedeo Plenty to [facility] or [facility] -IV ceftriaxone metronidazole given in ED, hold off on further antibiotics at this time pending podiatry evaluation -ESR, CRP - Hgba1c -Follow-up blood cultures obtained in ED -History of peripheral artery disease, will defer further imaging/workup to podiatry -N.p.o. midnight

## 2023-08-22 NOTE — ED Provider Notes (Signed)
[place] EMERGENCY DEPARTMENT AT [facility] Provider Note   CSN: [id number] Arrival date & time: 08/22/23  1421     History  Chief Complaint  Patient presents with   Wound Check    Raymond Walker. is a 72 y.o. male.  He has a history of peripheral vascular disease, COPD, kidney stones.  The ER with wound to the left foot the toe aspect over the first MTP with redness.  Noticed it 2 weeks ago, states it is getting sore and red.  No fevers, no drainage.  No nausea or vomiting.  He does have history of partial amputation to right foot with podiatry in the past.   Wound Check       Home Medications Prior to Admission medications   Medication Sig Start Date End Date Taking? Authorizing Provider  albuterol (VENTOLIN HFA) 108 (90 Base) MCG/ACT inhaler INHALE 2 PUFFS INTO THE LUNGS EVERY 4 HOURS AS NEEDED FOR WHEEZING OR SHORTNESS OF BREATH 08/09/21   Babs Sciara, MD  albuterol (VENTOLIN HFA) 108 (90 Base) MCG/ACT inhaler 2 puffs every 4 hours as needed for rescue inhaler 07/16/23   Luking, Scott A, MD  azelastine (ASTELIN) 0.1 % nasal spray Place 2 sprays into both nostrils 2 (two) times daily. 07/16/23   Babs Sciara, MD  azithromycin (ZITHROMAX) 250 MG tablet Take by mouth daily.    [provider]  budesonide-formoterol (SYMBICORT) 160-4.5 MCG/ACT inhaler Inhale 2 puffs into the lungs 2 (two) times daily. 07/16/23   Babs Sciara, MD  pantoprazole (PROTONIX) 40 MG tablet TAKE (1) TABLET BY MOUTH ONCE DAILY. 03/14/23   Babs Sciara, MD  rosuvastatin (CRESTOR) 5 MG tablet Take 1 tablet (5 mg total) by mouth daily. 07/16/23   Babs Sciara, MD  trimethoprim-polymyxin b (POLYTRIM) ophthalmic solution Place 1 drop into both eyes every 6 (six) hours. 07/18/23   Particia Nearing, PA-C      Allergies    Patient has no known allergies.    Review of Systems   Review of Systems  Physical Exam Updated Vital Signs BP (!) 111/57 (BP Location: Right Arm)    Pulse 78   Temp 98.8 F (37.1 C) (Oral)   Resp 18   SpO2 96%  Physical Exam Vitals and nursing note reviewed.  Constitutional:      General: He is not in acute distress.    Appearance: He is well-developed.  HENT:     Head: Normocephalic and atraumatic.     Mouth/Throat:     Mouth: Mucous membranes are moist.  Eyes:     Extraocular Movements: Extraocular movements intact.     Conjunctiva/sclera: Conjunctivae normal.     Pupils: Pupils are equal, round, and reactive to light.  Cardiovascular:     Rate and Rhythm: Normal rate and regular rhythm.     Heart sounds: No murmur heard. Pulmonary:     Effort: Pulmonary effort is normal. No respiratory distress.     Breath sounds: Normal breath sounds.  Abdominal:     Palpations: Abdomen is soft.     Tenderness: There is no abdominal tenderness.  Musculoskeletal:        General: No swelling.     Cervical back: Neck supple.     Comments: Decreased pulses and DP and PT left foot.  Foot is overall well perfused warm with normal cap refill  Skin:    General: Skin is warm and dry.     Capillary  Refill: Capillary refill takes less than 2 seconds.     Findings: Erythema present.     Comments: Ulceration with surrounding cellulitis noted to the medial aspect of the first MTP joint.  No active drainage.  Neurological:     General: No focal deficit present.     Mental Status: He is alert and oriented to person, place, and time.  Psychiatric:        Mood and Affect: Mood normal.     ED Results / Procedures / Treatments   Labs (all labs ordered are listed, but only abnormal results are displayed) Labs Reviewed  COMPREHENSIVE METABOLIC PANEL - Abnormal; Notable for the following components:      Result Value   Glucose, Bld 115 (*)    Calcium 8.8 (*)    Albumin 3.3 (*)    All other components within normal limits  CBC WITH DIFFERENTIAL/PLATELET - Abnormal; Notable for the following components:   WBC 11.0 (*)    Neutro Abs 8.1 (*)     Monocytes Absolute 1.3 (*)    All other components within normal limits  CULTURE, BLOOD (ROUTINE X 2)  CULTURE, BLOOD (ROUTINE X 2)  LACTIC ACID, PLASMA    EKG None  Radiology DG Foot Complete Left  Result Date: 08/22/2023 CLINICAL DATA:  Left inner foot wound. Previous right foot partial amputation. EXAM: LEFT FOOT - COMPLETE 3+ VIEW COMPARISON:  None Available. FINDINGS: Shallow soft tissue ulceration in the medial aspect of the left foot at the level of the 1st MTP joint. Bone destruction involving the medial aspect of the 1st metatarsal head and medial aspect of the base of the 1st proximal phalanx. No soft tissue gas, fracture or dislocation. IMPRESSION: Osteomyelitis involving the medial aspect of the 1st metatarsal head and medial aspect of the base of the 1st proximal phalanx with an overlying soft tissue ulceration. Electronically Signed   By: Beckie Salts M.D.   On: 08/22/2023 16:27    Procedures Ultrasound ED Peripheral IV (Provider)  Date/Time: 08/22/2023 5:30 PM  Performed by: Ma Rings, PA-C Authorized by: Ma Rings, PA-C   Procedure details:    Indications: multiple failed IV attempts     Skin Prep: chlorhexidine gluconate     Location: left medial upper arm.   Angiocath:  20 G   Bedside Ultrasound Guided: Yes     Images: not archived     Patient tolerated procedure without complications: Yes     Dressing applied: Yes       Medications Ordered in ED Medications  cefTRIAXone (ROCEPHIN) 2 g in sodium chloride 0.9 % 100 mL IVPB (0 g Intravenous Stopped 08/22/23 1803)    And  metroNIDAZOLE (FLAGYL) tablet 500 mg (has no administration in time range)    ED Course/ Medical Decision Making/ A&P                                 Medical Decision Making This patient presents to the ED for concern of left foot wound, this involves an extensive number of treatment options, and is a complaint that carries with it a high risk of complications and morbidity.   The differential diagnosis includes this, abscess, osteomyelitis, other   Co morbidities that complicate the patient evaluation :   Peripheral vascular disease   Additional history obtained:  Additional history obtained from EMR External records from outside source obtained and reviewed including notes  Lab Tests:  I Ordered, and personally interpreted labs.  The pertinent results include: Mild leukocytosis to 11   Imaging Studies ordered:  I ordered imaging studies including stray left foot which shows soft tissue ulceration medial aspect of the left foot at first MTP joint and bony destruction involving medial aspect of first metatarsal head and medial aspect of base of first proximal phalanx.  Soft tissue gas no fractures, no dislocations I independently visualized and interpreted imaging within scope of identifying emergent findings  I agree with the radiologist interpretation  Consultations Obtained:  I requested consultation with the Dr. Ardelle Anton,  and discussed lab and imaging findings as well as pertinent plan - they recommend: To [facility] or [facility] long to hospital medicine service   Problem List / ED Course / Critical interventions / Medication management  Osteomyelitis of left first metatarsal and base of first proximal phalanx, he is erythema, known PVD, no signs of acute ischemia, follows with podiatry as outpatient, consult with podiatry who wants him transferred to [facility] for admission per hospitalist.  Discussed with Dr. Mariea Clonts the hospitalist right now who is going to see the patient and admit him.  I have reviewed the patients home medicines and have made adjustments as needed  Amount and/or Complexity of Data Reviewed Labs: ordered. Radiology: ordered.  Risk Prescription drug management. Decision regarding hospitalization.           Final Clinical Impression(s) / ED Diagnoses Final diagnoses:  Osteomyelitis of great toe of left foot  [facility])    Rx / DC Orders ED Discharge Orders     None         Josem Kaufmann 08/22/23 1854    Glyn Ade, MD 08/23/23 0028

## 2023-08-22 NOTE — Assessment & Plan Note (Signed)
Stable.  Not on medication. 

## 2023-08-22 NOTE — ED Notes (Signed)
Assuming care from previous RN. Pt informed currently awaiting transport to [facility].

## 2023-08-23 ENCOUNTER — Inpatient Hospital Stay (HOSPITAL_COMMUNITY): Payer: Medicare HMO

## 2023-08-23 ENCOUNTER — Encounter (HOSPITAL_COMMUNITY): Payer: Medicare HMO

## 2023-08-23 DIAGNOSIS — I7090 Unspecified atherosclerosis: Secondary | ICD-10-CM

## 2023-08-23 DIAGNOSIS — M86172 Other acute osteomyelitis, left ankle and foot: Secondary | ICD-10-CM | POA: Diagnosis not present

## 2023-08-23 LAB — BASIC METABOLIC PANEL
Anion gap: 12 (ref 5–15)
BUN: 16 mg/dL (ref 8–23)
CO2: 24 mmol/L (ref 22–32)
Calcium: 8.5 mg/dL — ABNORMAL LOW (ref 8.9–10.3)
Chloride: 101 mmol/L (ref 98–111)
Creatinine, Ser: 1.15 mg/dL (ref 0.61–1.24)
GFR, Estimated: >60 mL/min (ref >60)
Glucose, Bld: 182 mg/dL — ABNORMAL HIGH (ref 70–99)
Potassium: 4 mmol/L (ref 3.5–5.1)
Sodium: 137 mmol/L (ref 135–145)

## 2023-08-23 LAB — CBC
HCT: 39.1 % (ref 39.0–52.0)
Hemoglobin: 13.1 g/dL (ref 13.0–17.0)
MCH: 29 pg (ref 26.0–34.0)
MCHC: 33.5 g/dL (ref 30.0–36.0)
MCV: 86.5 fL (ref 80.0–100.0)
Platelets: 178 10*3/uL (ref 150–400)
RBC: 4.52 MIL/uL (ref 4.22–5.81)
RDW: 14 % (ref 11.5–15.5)
WBC: 10.2 10*3/uL (ref 4.0–10.5)
nRBC: 0 % (ref 0.0–0.2)

## 2023-08-23 LAB — C-REACTIVE PROTEIN: CRP: 10.7 mg/dL — ABNORMAL HIGH (ref <1.0)

## 2023-08-23 MED ORDER — HYDRALAZINE HCL 20 MG/ML IJ SOLN: 5.0000 mg | Freq: Four times a day (QID) | INTRAMUSCULAR | Status: DC | PRN

## 2023-08-23 MED ORDER — SODIUM CHLORIDE 0.9 % IV SOLN
2.0000 g | Freq: Every day | INTRAVENOUS | Status: DC
  Administered 2023-08-23 – 2023-08-25 (×3): 2 g via INTRAVENOUS
  Filled 2023-08-23 (×4): qty 20

## 2023-08-23 MED ORDER — METRONIDAZOLE 500 MG/100ML IV SOLN
500.0000 mg | Freq: Two times a day (BID) | INTRAVENOUS | Status: DC
  Administered 2023-08-23 – 2023-08-28 (×11): 500 mg via INTRAVENOUS
  Filled 2023-08-23 (×11): qty 100

## 2023-08-23 MED ORDER — VANCOMYCIN HCL 1250 MG/250ML IV SOLN
1250.0000 mg | INTRAVENOUS | Status: DC
  Administered 2023-08-24 – 2023-08-28 (×5): 1250 mg via INTRAVENOUS
  Filled 2023-08-23 (×5): qty 250

## 2023-08-23 MED ORDER — TRAMADOL HCL 50 MG PO TABS
25.0000 mg | ORAL_TABLET | Freq: Three times a day (TID) | ORAL | Status: DC | PRN
  Administered 2023-08-23 – 2023-08-29 (×3): 25 mg via ORAL
  Filled 2023-08-23 (×3): qty 1

## 2023-08-23 MED ORDER — VANCOMYCIN HCL 1750 MG/350ML IV SOLN
1750.0000 mg | Freq: Once | INTRAVENOUS | Status: AC
  Administered 2023-08-23: 1750 mg via INTRAVENOUS
  Filled 2023-08-23: qty 350

## 2023-08-23 MED ORDER — ENOXAPARIN SODIUM 40 MG/0.4ML IJ SOSY
40.0000 mg | PREFILLED_SYRINGE | INTRAMUSCULAR | Status: DC
  Filled 2023-08-23 (×2): qty 0.4

## 2023-08-23 NOTE — Progress Notes (Signed)
ABI has been completed.   Results can be found under chart review under CV PROC. 08/23/2023 3:55 PM Mariana Wiederholt RVT, RDMS

## 2023-08-23 NOTE — Plan of Care (Signed)

## 2023-08-23 NOTE — Progress Notes (Signed)
PROGRESS NOTE    Raymond Walker.  UXL:[id number] DOB: 1951-12-16 DOA: 08/22/2023 PCP: Babs Sciara, MD   Brief Narrative: 72 year old with past medical history significant for peripheral artery disease, hypertension, COPD, Seizure presented to the ED complaining of increasing pain and redness at the site of his chronic left foot wound.  X-ray of the left foot showed osteomyelitis involving the medial aspect of the first metatarsal head and medial aspect of the base of the first proximal phalanx with overlying soft tissue ulceration.   Assessment & Plan:   Principal Problem:   Osteomyelitis of foot, left, acute (HCC) Active Problems:   COPD (chronic obstructive pulmonary disease) (HCC)   PAD (peripheral artery disease) (HCC)   Essential hypertension   Status post transmetatarsal amputation of foot, right (HCC)   1-Left Foot Osteomyelitis: -X-ray of the left foot showed osteomyelitis involving the medial aspect of the first metatarsal head and medial aspect of the base of the first proximal phalanx with overlying soft tissue ulceration. -Podiatry  consulted. -MRI of the left foot ordered and pending -ABIs ordered -Start IV vancomycin, ceftriaxone, Flagyl -ESR: 76---31. CRP: 10. -Tramadol PRN for pain.   Essential hypertension: -Not on medications at home.  Will order as needed hydralazine  COPD: Albuterol as needed Continue with Dulera  Hyperlipidemia: Continue with Crestor      Estimated body mass index is 21.7 kg/m as calculated from the following:   Height as of 07/16/23: 6' (1.829 m).   Weight as of 07/16/23: 72.6 kg.   DVT prophylaxis: Lovenox Code Status: DNR limited Family Communication: care discussed with patient.  Disposition Plan:  Status is: Inpatient Remains inpatient appropriate because: management of left foot infection     Consultants:  Dr Germaine Pomfret  Procedures:  None  Antimicrobials:    Subjective: He is alert, report mild left foot  pain.   Objective: Vitals:   08/22/23 2034 08/22/23 2356 08/23/23 0429 08/23/23 0750  BP: (!) 156/87 (!) 133/59 130/62 (!) 142/54  Pulse: (!) 104 84 87 94  Resp: 20 16 18 17   Temp:    98.9 F (37.2 C)  TempSrc:    Oral  SpO2: 92% 94% 94% 94%    Intake/Output Summary (Last 24 hours) at 08/23/2023 0752 Last data filed at 08/23/2023 0532 Gross per 24 hour  Intake --  Output 750 ml  Net -750 ml   There were no vitals filed for this visit.  Examination:  General exam: Appears calm and comfortable  Respiratory system: Clear to auscultation. Respiratory effort normal. Cardiovascular system: S1 & S2 heard, RRR. No JVD, murmurs, rubs, gallops or clicks. No pedal edema. Gastrointestinal system: Abdomen is nondistended, soft and nontender. No organomegaly or masses felt. Normal bowel sounds heard. Central nervous system: Alert and oriented. No focal neurological deficits. Extremities: left foot with redness 2 dry ulcer near big toe  Data Reviewed: I have personally reviewed following labs and imaging studies  CBC: Recent Labs  Lab 08/22/23 1736  WBC 11.0*  NEUTROABS 8.1*  HGB 13.4  HCT 40.9  MCV 89.5  PLT 199   Basic Metabolic Panel: Recent Labs  Lab 08/22/23 1736  NA 138  K 4.1  CL 98  CO2 28  GLUCOSE 115*  BUN 17  CREATININE 1.17  CALCIUM 8.8*   GFR: CrCl cannot be calculated (Unknown ideal weight.). Liver Function Tests: Recent Labs  Lab 08/22/23 1736  AST 15  ALT 21  ALKPHOS 68  BILITOT 0.6  PROT 7.5  ALBUMIN 3.3*   No results for input(s): "LIPASE", "AMYLASE" in the last 168 hours. No results for input(s): "AMMONIA" in the last 168 hours. Coagulation Profile: No results for input(s): "INR", "PROTIME" in the last 168 hours. Cardiac Enzymes: No results for input(s): "CKTOTAL", "CKMB", "CKMBINDEX", "TROPONINI" in the last 168 hours. BNP (last 3 results) No results for input(s): "PROBNP" in the last 8760 hours. HbA1C: Recent Labs    08/22/23 2059   HGBA1C 6.1*   CBG: No results for input(s): "GLUCAP" in the last 168 hours. Lipid Profile: No results for input(s): "CHOL", "HDL", "LDLCALC", "TRIG", "CHOLHDL", "LDLDIRECT" in the last 72 hours. Thyroid Function Tests: No results for input(s): "TSH", "T4TOTAL", "FREET4", "T3FREE", "THYROIDAB" in the last 72 hours. Anemia Panel: No results for input(s): "VITAMINB12", "FOLATE", "FERRITIN", "TIBC", "IRON", "RETICCTPCT" in the last 72 hours. Sepsis Labs: Recent Labs  Lab 08/22/23 1736  LATICACIDVEN 1.8    Recent Results (from the past 240 hour(s))  Blood Cultures x 2 sites     Status: None (Preliminary result)   Collection Time: 08/22/23  5:36 PM   Specimen: BLOOD LEFT ARM  Result Value Ref Range Status   Specimen Description BLOOD LEFT ARM  Final   Special Requests   Final    BOTTLES DRAWN AEROBIC AND ANAEROBIC Blood Culture adequate volume   Culture   Final    NO GROWTH < 24 HOURS Performed at [facility], [address] [place], [place] [address]    Report Status PENDING  Incomplete  Blood Cultures x 2 sites     Status: None (Preliminary result)   Collection Time: 08/22/23  6:27 PM   Specimen: BLOOD  Result Value Ref Range Status   Specimen Description BLOOD RIGHT ANTECUBITAL  Final   Special Requests   Final    BOTTLES DRAWN AEROBIC AND ANAEROBIC Blood Culture adequate volume   Culture   Final    NO GROWTH < 12 HOURS Performed at [facility], [address] [place], [place] [address]    Report Status PENDING  Incomplete         Radiology Studies: DG Foot Complete Left  Result Date: 08/22/2023 CLINICAL DATA:  Left inner foot wound. Previous right foot partial amputation. EXAM: LEFT FOOT - COMPLETE 3+ VIEW COMPARISON:  None Available. FINDINGS: Shallow soft tissue ulceration in the medial aspect of the left foot at the level of the 1st MTP joint. Bone destruction involving the medial aspect of the 1st metatarsal head and medial aspect of the base of the 1st  proximal phalanx. No soft tissue gas, fracture or dislocation. IMPRESSION: Osteomyelitis involving the medial aspect of the 1st metatarsal head and medial aspect of the base of the 1st proximal phalanx with an overlying soft tissue ulceration. Electronically Signed   By: Beckie Salts M.D.   On: 08/22/2023 16:27        Scheduled Meds:  azelastine  2 spray Each Nare BID   mometasone-formoterol  2 puff Inhalation BID   pantoprazole  40 mg Oral Daily   rosuvastatin  5 mg Oral Daily   trimethoprim-polymyxin b  1 drop Both Eyes Q6H   Continuous Infusions:   LOS: 1 day    Time spent: 35 minutes    Phelan Goers A Anayla Giannetti, MD [facility]   If 7PM-7AM, please contact night-coverage [email or web address]  08/23/2023, 7:52 AM

## 2023-08-23 NOTE — Care Management Important Message (Signed)
Important Message  Patient Details  Name: Raymond Walker. MRN: [id number] Date of Birth: Jun 07, 1951   Medicare Important Message Given:  Yes     Sherilyn Banker 08/23/2023, 2:36 PM

## 2023-08-23 NOTE — Consult Note (Signed)
Reason for Consult:Osteomyelitis left foot Referring Physician: Dr. Hartley Barefoot, MD  Raymond Walker. is an 72 y.o. male.  HPI:  72 y.o. male with medical history significant for peripheral artery disease, hypertension, COPD, seizures.  Patient admitted for ulceration, cellulitis with osteomyelitis of the left foot.  He states he developed a wound a couple weeks ago.  He has not had any recent treatment for this.  He does not Dors any fevers or chills he does get pain to the toe.  No leg pain.  Past Medical History:  Diagnosis Date   Caregiver with fatigue 05/02/2020   COPD (chronic obstructive pulmonary disease) (HCC)    Deformity    equinus and metatarsal   Erectile dysfunction    Erectile dysfunction 05/02/2020   Full dentures    History of kidney stones    History of seizures 05/31/2020   Between ages 46 and 13   Hypertension    Impaired glucose tolerance    Pancreatitis, recurrent    Pneumonia    Thrombocytopenia (HCC) 08/10/2018   Staying in the low 100s will follow closely    Past Surgical History:  Procedure Laterality Date   ABDOMINAL AORTOGRAM W/LOWER EXTREMITY Bilateral 08/20/2019   Procedure: ABDOMINAL AORTOGRAM W/LOWER EXTREMITY;  Surgeon: Cephus Shelling, MD;  [facility] INVASIVE CV LAB;  Service: Cardiovascular;  Laterality: Bilateral;   ACHILLES TENDON SURGERY Right 08/26/2020   Procedure: ACHILLES TENDON LENGTHENING;  Surgeon: Edwin Cap, DPM;  Location: [facility] OR;  Service: Podiatry;  Laterality: Right;   AMPUTATION Right 09/15/2019   Procedure: AMPUTATION RIGHT TOES One, Two, And Three;  Surgeon: Maeola Harman, MD;  Location: [facility] OR;  Service: Vascular;  Laterality: Right;   APPENDECTOMY     CATARACT EXTRACTION W/PHACO Right 05/02/2018   Procedure: CATARACT EXTRACTION PHACO AND INTRAOCULAR LENS PLACEMENT (IOC);  Surgeon: Fabio Pierce, MD;  Location: [facility] ORS;  Service: Ophthalmology;  Laterality: Right;  CDE: 11.11   CATARACT EXTRACTION W/PHACO  Left 07/04/2018   Procedure: CATARACT EXTRACTION PHACO AND INTRAOCULAR LENS PLACEMENT (IOC);  Surgeon: Fabio Pierce, MD;  Location: [facility] ORS;  Service: Ophthalmology;  Laterality: Left;  CDE: 7.15   CHOLECYSTECTOMY     CYSTOSCOPY W/ URETERAL STENT PLACEMENT Right 11/22/2021   Procedure: CYSTOSCOPY WITH RETROGRADE PYELOGRAM/URETERAL STENT PLACEMENT;  Surgeon: Marcine Matar, MD;  Location: [facility] ORS;  Service: Urology;  Laterality: Right;   CYSTOSCOPY WITH RETROGRADE PYELOGRAM, URETEROSCOPY AND STENT PLACEMENT Right 02/01/2022   Procedure: CYSTOSCOPY WITH RETROGRADE PYELOGRAM, URETEROSCOPY AND STENT PLACEMENT;  Surgeon: Malen Gauze, MD;  Location: [facility] ORS;  Service: Urology;  Laterality: Right;   FEMORAL-POPLITEAL BYPASS GRAFT Right 09/15/2019   Procedure: Right BYPASS GRAFT FEMORAL to Above Knee POPLITEAL ARTERY;  Surgeon: Maeola Harman, MD;  Location: [facility] OR;  Service: Vascular;  Laterality: Right;   FEMORAL-POPLITEAL BYPASS GRAFT Right 01/26/2020   Procedure: IRRIGATION AND DEBRIDEMENT RIGHT FEMORAL POPLITEAL BYPASS SITE;  Surgeon: Maeola Harman, MD;  Location: [facility] OR;  Service: Vascular;  Laterality: Right;   HOLMIUM LASER APPLICATION Right 02/01/2022   Procedure: HOLMIUM LASER APPLICATION;  Surgeon: Malen Gauze, MD;  Location: [facility] ORS;  Service: Urology;  Laterality: Right;   MULTIPLE TOOTH EXTRACTIONS     ORCHIECTOMY     PERIPHERAL VASCULAR INTERVENTION Left 08/20/2019   Procedure: PERIPHERAL VASCULAR INTERVENTION;  Surgeon: Cephus Shelling, MD;  Location: [facility] INVASIVE CV LAB;  Service: Cardiovascular;  Laterality: Left;  common/external iliac   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  Procedure: TRANSMETATARSAL AMPUTATION;  Surgeon: Maeola Harman, MD;  Location: [facility] OR;  Service: Vascular;  Laterality: Right;   TRANSMETATARSAL AMPUTATION Right 08/26/2020   Procedure: TRANSMETATARSAL AMPUTATION REVISION;  Surgeon: Edwin Cap, DPM;  Location:  [facility] OR;  Service: Podiatry;  Laterality: Right;   VASECTOMY     WOUND DEBRIDEMENT Right 08/26/2020   Procedure: EXCISION WOUND;  Surgeon: Edwin Cap, DPM;  Location: [facility] OR;  Service: Podiatry;  Laterality: Right;    Family History  Problem Relation Age of Onset   Hypertension Father    Coronary artery disease Father    Coronary artery disease Mother    Cancer Mother        Breast    Social History:  reports that he has been smoking cigarettes. He has a 30 pack-year smoking history. He has quit using smokeless tobacco.  His smokeless tobacco use included chew. He reports that he does not drink alcohol and does not use drugs.  Allergies: No Known Allergies  Medications: I have reviewed the patient's current medications.  Results for orders placed or performed during the hospital encounter of 08/22/23 (from the past 48 hour(s))  Comprehensive metabolic panel     Status: Abnormal   Collection Time: 08/22/23  5:36 PM  Result Value Ref Range   Sodium 138 135 - 145 mmol/L   Potassium 4.1 3.5 - 5.1 mmol/L   Chloride 98 98 - 111 mmol/L   CO2 28 22 - 32 mmol/L   Glucose, Bld 115 (H) 70 - 99 mg/dL    Comment: Glucose reference range applies only to samples taken after fasting for at least 8 hours.   BUN 17 8 - 23 mg/dL   Creatinine, Ser 4.09 0.61 - 1.24 mg/dL   Calcium 8.8 (L) 8.9 - 10.3 mg/dL   Total Protein 7.5 6.5 - 8.1 g/dL   Albumin 3.3 (L) 3.5 - 5.0 g/dL   AST 15 15 - 41 U/L   ALT 21 0 - 44 U/L   Alkaline Phosphatase 68 38 - 126 U/L   Total Bilirubin 0.6 0.3 - 1.2 mg/dL   GFR, Estimated >81 >19 mL/min    Comment: (NOTE) Calculated using the CKD-EPI Creatinine Equation (2021)    Anion gap 12 5 - 15    Comment: Performed at [facility], [address] [place], [place] [address]  CBC with Differential     Status: Abnormal   Collection Time: 08/22/23  5:36 PM  Result Value Ref Range   WBC 11.0 (H) 4.0 - 10.5 K/uL   RBC 4.57 4.22 - 5.81 MIL/uL   Hemoglobin 13.4 13.0 -  17.0 g/dL   HCT 95.6 21.3 - 08.6 %   MCV 89.5 80.0 - 100.0 fL   MCH 29.3 26.0 - 34.0 pg   MCHC 32.8 30.0 - 36.0 g/dL   RDW 57.8 46.9 - 62.9 %   Platelets 199 150 - 400 K/uL   nRBC 0.0 0.0 - 0.2 %   Neutrophils Relative % 72 %   Neutro Abs 8.1 (H) 1.7 - 7.7 K/uL   Lymphocytes Relative 13 %   Lymphs Abs 1.5 0.7 - 4.0 K/uL   Monocytes Relative 12 %   Monocytes Absolute 1.3 (H) 0.1 - 1.0 K/uL   Eosinophils Relative 1 %   Eosinophils Absolute 0.1 0.0 - 0.5 K/uL   Basophils Relative 1 %   Basophils Absolute 0.1 0.0 - 0.1 K/uL   Immature Granulocytes 1 %   Abs Immature Granulocytes 0.06  0.00 - 0.07 K/uL    Comment: Performed at [facility], [address] [place], [place] [address]  Lactic acid     Status: None   Collection Time: 08/22/23  5:36 PM  Result Value Ref Range   Lactic Acid, Venous 1.8 0.5 - 1.9 mmol/L    Comment: Performed at [facility], [address], [place], [place] [address]  Blood Cultures x 2 sites     Status: None (Preliminary result)   Collection Time: 08/22/23  5:36 PM   Specimen: BLOOD LEFT ARM  Result Value Ref Range   Specimen Description BLOOD LEFT ARM    Special Requests      BOTTLES DRAWN AEROBIC AND ANAEROBIC Blood Culture adequate volume Performed at [facility], [address] [place], [place] [address]    Culture PENDING    Report Status PENDING   Blood Cultures x 2 sites     Status: None (Preliminary result)   Collection Time: 08/22/23  6:27 PM   Specimen: BLOOD  Result Value Ref Range   Specimen Description BLOOD RIGHT ANTECUBITAL    Special Requests      BOTTLES DRAWN AEROBIC AND ANAEROBIC Blood Culture adequate volume Performed at [facility], [address] [place], [place] [address]    Culture PENDING    Report Status PENDING   Hemoglobin A1c     Status: Abnormal   Collection Time: 08/22/23  8:59 PM  Result Value Ref Range   Hgb A1c MFr Bld 6.1 (H) 4.8 - 5.6 %    Comment: (NOTE) Pre diabetes:          5.7%-6.4%  Diabetes:               >6.4%  Glycemic control for   <7.0% adults with diabetes    Mean Plasma Glucose 128.37 mg/dL    Comment: Performed at [facility], [address], [place], [place] [address]  Sedimentation rate     Status: Abnormal   Collection Time: 08/22/23  9:52 PM  Result Value Ref Range   Sed Rate 31 (H) 0 - 16 mm/hr    Comment: Performed at [facility], [address] [place], [place] [address]  C-reactive protein     Status: Abnormal   Collection Time: 08/22/23  9:52 PM  Result Value Ref Range   CRP 10.3 (H) <1.0 mg/dL    Comment: Performed at [facility], [address], [place], [place] [address]    DG Foot Complete Left  Result Date: 08/22/2023 CLINICAL DATA:  Left inner foot wound. Previous right foot partial amputation. EXAM: LEFT FOOT - COMPLETE 3+ VIEW COMPARISON:  None Available. FINDINGS: Shallow soft tissue ulceration in the medial aspect of the left foot at the level of the 1st MTP joint. Bone destruction involving the medial aspect of the 1st metatarsal head and medial aspect of the base of the 1st proximal phalanx. No soft tissue gas, fracture or dislocation. IMPRESSION: Osteomyelitis involving the medial aspect of the 1st metatarsal head and medial aspect of the base of the 1st proximal phalanx with an overlying soft tissue ulceration. Electronically Signed   By: Beckie Salts M.D.   On: 08/22/2023 16:27    Review of Systems Blood pressure 130/62, pulse 87, temperature 98.4 F (36.9 C), temperature source Oral, resp. rate 18, SpO2 94%. Physical Exam General: NAD  Dermatological: Ulceration noted on the medial aspect of the first MPJ.  There is no fluctuance or crepitation.  There is cellulitis present the first MPJ as pictured below.  No crepitation is noted.  There is no pain in the calf or crepitation.  No swelling in the calf.      Vascular: Pulses decreased  Neruologic: Patient decreased  Musculoskeletal: No significant pain on exam today.  Right TMA   Assessment/Plan:  72 year old male with left foot osteomyelitis, cellulitis  I independently reviewed the x-rays.  I ordered an MRI to further evaluate the extent of the infection.  Also recommend arterial studies which I have ordered.  Continue broad-spectrum antibiotics.  I discussed with the patient least partial first ray amputation likely over the weekend pending further workup.  Patient is in agreement to have the surgery. Podiatry will continue to follow.  Raymond Walker 08/23/2023, 6:46 AM

## 2023-08-23 NOTE — Progress Notes (Signed)
Pharmacy Antibiotic Note  Raymond Behrns. is a 72 y.o. male admitted on 08/22/2023 with  osteomyelitis of L foot .  Xray confirms osteo w/ overlying soft tissue ulceration. Pharmacy has been consulted for vancomycin dosing. WBC 11.0, afeb. LA 1.8.   Using historical weight (07/16/23) of 72.6 kg   Plan: Vanc 1750mg  IV  x1 followed by vancomycin 1250mg  IV  q 24h (eAUC 464) -goal AUC 400-600, levels at steady state or PRN per protocol -f/u renal function , surgical plans, LOT   Ceftriaxone per MD  Flagyl per MD      Temp (24hrs), Avg:98.7 F (37.1 C), Min:98.4 F (36.9 C), Max:98.9 F (37.2 C)  Recent Labs  Lab 08/22/23 1736  WBC 11.0*  CREATININE 1.17  LATICACIDVEN 1.8    CrCl cannot be calculated (Unknown ideal weight.).    No Known Allergies  Antimicrobials this admission: Ceftriax 9/5> Vanc 9/6>  Flagyl 9/6>  Dose adjustments this admission:   Microbiology results: 9/5 BCX:   Thank you for allowing pharmacy to be a part of this patient's care.  Calton Dach, PharmD, BCCCP Clinical Pharmacist 08/23/2023 8:00 AM

## 2023-08-24 DIAGNOSIS — I70245 Atherosclerosis of native arteries of left leg with ulceration of other part of foot: Secondary | ICD-10-CM | POA: Diagnosis not present

## 2023-08-24 DIAGNOSIS — M86172 Other acute osteomyelitis, left ankle and foot: Secondary | ICD-10-CM | POA: Diagnosis not present

## 2023-08-24 LAB — VITAMIN B12: Vitamin B-12: 155 pg/mL — ABNORMAL LOW (ref 180–914)

## 2023-08-24 MED ORDER — VITAMIN B-12 1000 MCG PO TABS
1000.0000 ug | ORAL_TABLET | Freq: Every day | ORAL | Status: DC
  Administered 2023-08-29 – 2023-08-30 (×2): 1000 ug via ORAL
  Filled 2023-08-24 (×2): qty 1

## 2023-08-24 MED ORDER — CYANOCOBALAMIN 1000 MCG/ML IJ SOLN
1000.0000 ug | Freq: Every day | INTRAMUSCULAR | Status: AC
  Administered 2023-08-24 – 2023-08-28 (×5): 1000 ug via INTRAMUSCULAR
  Filled 2023-08-24 (×5): qty 1

## 2023-08-24 NOTE — Consult Note (Signed)
[facility] [place]  ASSESSMENT / PLAN: Raymond Walker. is a 72 y.o. male with atherosclerosis of native arteries of left lower extremity causing ulceration.  Recommend:  Abstinence from all tobacco products. Blood glucose control with goal A1c < 7%. Blood pressure control with goal blood pressure < 140/90 mmHg. Lipid reduction therapy with goal LDL-C <100 mg/dL Aspirin 81mg  PO QD.  Atorvastatin 40-80mg  PO QD (or other "high intensity" statin therapy).  Plan left lower extremity angiogram with possible intervention via right common femoral approach in cath lab 08/26/23 with Dr. Randie Heinz.   CHIEF COMPLAINT: left foot ulcer  HISTORY OF PRESENT ILLNESS: Raymond Walker. is a 72 y.o. male admitted to the internal medicine service for left foot pain and redness. He has a long peripheral vascular history. He has undergone multiple procedures to salvage his right lower extremity. He has a painful ulcer on the medial aspect of his right foot. He has no other complaints. He does not report any claudication or rest pain. The ulcer has been present for about 2 weeks.   Past Medical History:  Diagnosis Date   Caregiver with fatigue 05/02/2020   COPD (chronic obstructive pulmonary disease) (HCC)    Deformity    equinus and metatarsal   Erectile dysfunction    Erectile dysfunction 05/02/2020   Full dentures    History of kidney stones    History of seizures 05/31/2020   Between ages 38 and 29   Hypertension    Impaired glucose tolerance    Pancreatitis, recurrent    Pneumonia    Thrombocytopenia (HCC) 08/10/2018   Staying in the low 100s will follow closely    Past Surgical History:  Procedure Laterality Date   ABDOMINAL AORTOGRAM W/LOWER EXTREMITY Bilateral 08/20/2019   Procedure: ABDOMINAL AORTOGRAM W/LOWER EXTREMITY;  Surgeon: Cephus Shelling, MD;  Location: [facility] INVASIVE CV LAB;  Service: Cardiovascular;  Laterality: Bilateral;   ACHILLES TENDON SURGERY Right  08/26/2020   Procedure: ACHILLES TENDON LENGTHENING;  Surgeon: Edwin Cap, DPM;  Location: [facility] OR;  Service: Podiatry;  Laterality: Right;   AMPUTATION Right 09/15/2019   Procedure: AMPUTATION RIGHT TOES One, Two, And Three;  Surgeon: Maeola Harman, MD;  Location: [facility] OR;  Service: Vascular;  Laterality: Right;   APPENDECTOMY     CATARACT EXTRACTION W/PHACO Right 05/02/2018   Procedure: CATARACT EXTRACTION PHACO AND INTRAOCULAR LENS PLACEMENT (IOC);  Surgeon: Fabio Pierce, MD;  Location: [facility] ORS;  Service: Ophthalmology;  Laterality: Right;  CDE: 11.11   CATARACT EXTRACTION W/PHACO Left 07/04/2018   Procedure: CATARACT EXTRACTION PHACO AND INTRAOCULAR LENS PLACEMENT (IOC);  Surgeon: Fabio Pierce, MD;  Location: [facility] ORS;  Service: Ophthalmology;  Laterality: Left;  CDE: 7.15   CHOLECYSTECTOMY     CYSTOSCOPY W/ URETERAL STENT PLACEMENT Right 11/22/2021   Procedure: CYSTOSCOPY WITH RETROGRADE PYELOGRAM/URETERAL STENT PLACEMENT;  Surgeon: Marcine Matar, MD;  Location: [facility] ORS;  Service: Urology;  Laterality: Right;   CYSTOSCOPY WITH RETROGRADE PYELOGRAM, URETEROSCOPY AND STENT PLACEMENT Right 02/01/2022   Procedure: CYSTOSCOPY WITH RETROGRADE PYELOGRAM, URETEROSCOPY AND STENT PLACEMENT;  Surgeon: Malen Gauze, MD;  Location: [facility] ORS;  Service: Urology;  Laterality: Right;   FEMORAL-POPLITEAL BYPASS GRAFT Right 09/15/2019   Procedure: Right BYPASS GRAFT FEMORAL to Above Knee POPLITEAL ARTERY;  Surgeon: Maeola Harman, MD;  Location: [facility] OR;  Service: Vascular;  Laterality: Right;   FEMORAL-POPLITEAL BYPASS GRAFT Right 01/26/2020   Procedure: IRRIGATION AND DEBRIDEMENT RIGHT FEMORAL POPLITEAL BYPASS SITE;  Surgeon: Maeola Harman, MD;  Location: [facility] OR;  Service: Vascular;  Laterality: Right;   HOLMIUM LASER APPLICATION Right 02/01/2022   Procedure: HOLMIUM LASER APPLICATION;  Surgeon: Malen Gauze, MD;  Location: [facility] ORS;  Service: Urology;  Laterality:  Right;   MULTIPLE TOOTH EXTRACTIONS     ORCHIECTOMY     PERIPHERAL VASCULAR INTERVENTION Left 08/20/2019   Procedure: PERIPHERAL VASCULAR INTERVENTION;  Surgeon: Cephus Shelling, MD;  Location: [facility] INVASIVE CV LAB;  Service: Cardiovascular;  Laterality: Left;  common/external iliac   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   Procedure: TRANSMETATARSAL AMPUTATION;  Surgeon: Maeola Harman, MD;  Location: [facility] OR;  Service: Vascular;  Laterality: Right;   TRANSMETATARSAL AMPUTATION Right 08/26/2020   Procedure: TRANSMETATARSAL AMPUTATION REVISION;  Surgeon: Edwin Cap, DPM;  Location: [facility] OR;  Service: Podiatry;  Laterality: Right;   VASECTOMY     WOUND DEBRIDEMENT Right 08/26/2020   Procedure: EXCISION WOUND;  Surgeon: Edwin Cap, DPM;  Location: [facility] OR;  Service: Podiatry;  Laterality: Right;    Family History  Problem Relation Age of Onset   Hypertension Father    Coronary artery disease Father    Coronary artery disease Mother    Cancer Mother        Breast    Social History   Socioeconomic History   Marital status: Divorced    Spouse name: Not on file   Number of children: Not on file   Years of education: 3   Highest education level: 3rd grade  Occupational History   Occupation: Passenger transport manager: [employer]  Tobacco Use   Smoking status: Every Day    Current packs/day: 1.00    Average packs/day: 1 pack/day for 30.0 years (30.0 ttl pk-yrs)    Types: Cigarettes   Smokeless tobacco: Former    Types: Engineer, drilling   Vaping status: Never Used  Substance and Sexual Activity   Alcohol use: No   Drug use: No   Sexual activity: Not Currently  Other Topics Concern   Not on file  Social History Narrative   Not on file   Social Determinants of Health   Financial Resource Strain: High Risk (03/13/2023)   Overall Financial Resource Strain (CARDIA)    Difficulty of Paying Living Expenses: Hard  Food Insecurity: Food  Insecurity Present (08/22/2023)   Hunger Vital Sign    Worried About Running Out of Food in the Last Year: Sometimes true    Ran Out of Food in the Last Year: Patient declined  Transportation Needs: No Transportation Needs (08/22/2023)   PRAPARE - Administrator, Civil Service (Medical): No    Lack of Transportation (Non-Medical): No  Physical Activity: Unknown (03/13/2023)   Exercise Vital Sign    Days of Exercise per Week: 0 days    Minutes of Exercise per Session: Not on file  Stress: No Stress Concern Present (03/13/2023)   [employer] of Occupational Health - Occupational Stress Questionnaire    Feeling of Stress : Only a little  Social Connections: Socially Isolated (03/13/2023)   Social Connection and Isolation Panel [NHANES]    Frequency of Communication with Friends and Family: Once a week    Frequency of Social Gatherings with Friends and Family: Never    Attends Religious Services: Never    Database administrator or Organizations: No    Attends Engineer, structural: Not on file    Marital Status: Divorced  Intimate Partner Violence: Not At Risk (08/22/2023)   Humiliation, Afraid, Rape, and Kick questionnaire    Fear of Current or Ex-Partner: No    Emotionally Abused: No    Physically Abused: No    Sexually Abused: No    No Known Allergies  Current Facility-Administered Medications  Medication Dose Route Frequency Provider Last Rate Last Admin   acetaminophen (TYLENOL) tablet 650 mg  650 mg Oral Q6H PRN Emokpae, Ejiroghene E, MD       Or   acetaminophen (TYLENOL) suppository 650 mg  650 mg Rectal Q6H PRN Emokpae, Ejiroghene E, MD       albuterol (PROVENTIL) (2.5 MG/3ML) 0.083% nebulizer solution 2.5 mg  2.5 mg Inhalation Q4H PRN Emokpae, Ejiroghene E, MD       azelastine (ASTELIN) 0.1 % nasal spray 2 spray  2 spray Each Nare BID Emokpae, Ejiroghene E, MD       cefTRIAXone (ROCEPHIN) 2 g in sodium chloride 0.9 % 100 mL IVPB  2 g Intravenous Daily  Regalado, Belkys A, MD 200 mL/hr at 08/24/23 0817 2 g at 08/24/23 0817   cyanocobalamin (VITAMIN B12) injection 1,000 mcg  1,000 mcg Intramuscular Daily Regalado, Belkys A, MD   1,000 mcg at 08/24/23 1157   [START ON 08/29/2023] cyanocobalamin (VITAMIN B12) tablet 1,000 mcg  1,000 mcg Oral Daily Regalado, Belkys A, MD       enoxaparin (LOVENOX) injection 40 mg  40 mg Subcutaneous Q24H Regalado, Belkys A, MD       hydrALAZINE (APRESOLINE) injection 5 mg  5 mg Intravenous Q6H PRN Regalado, Belkys A, MD       metroNIDAZOLE (FLAGYL) IVPB 500 mg  500 mg Intravenous BID Regalado, Belkys A, MD 100 mL/hr at 08/24/23 1014 500 mg at 08/24/23 1014   mometasone-formoterol (DULERA) 200-5 MCG/ACT inhaler 2 puff  2 puff Inhalation BID Emokpae, Ejiroghene E, MD   2 puff at 08/24/23 0750   ondansetron (ZOFRAN) tablet 4 mg  4 mg Oral Q6H PRN Emokpae, Ejiroghene E, MD       Or   ondansetron (ZOFRAN) injection 4 mg  4 mg Intravenous Q6H PRN Emokpae, Ejiroghene E, MD       pantoprazole (PROTONIX) EC tablet 40 mg  40 mg Oral Daily Emokpae, Ejiroghene E, MD   40 mg at 08/24/23 0816   polyethylene glycol (MIRALAX / GLYCOLAX) packet 17 g  17 g Oral Daily PRN Emokpae, Ejiroghene E, MD       rosuvastatin (CRESTOR) tablet 5 mg  5 mg Oral Daily Emokpae, Ejiroghene E, MD   5 mg at 08/24/23 0816   traMADol (ULTRAM) tablet 25 mg  25 mg Oral Q8H PRN Regalado, Belkys A, MD   25 mg at 08/24/23 0305   trimethoprim-polymyxin b (POLYTRIM) ophthalmic solution 1 drop  1 drop Both Eyes Q6H Emokpae, Ejiroghene E, MD       vancomycin (VANCOREADY) IVPB 1250 mg/250 mL  1,250 mg Intravenous Q24H Calton Dach I, RPH 166.7 mL/hr at 08/24/23 1159 1,250 mg at 08/24/23 1159    PHYSICAL EXAM Vitals:   08/24/23 0322 08/24/23 0751 08/24/23 0901 08/24/23 1154  BP: (!) 125/46  129/67 132/67  Pulse: 80  88 78  Resp: 18  14 18   Temp: 98.1 F (36.7 C)  98.3 F (36.8 C) 98 F (36.7 C)  TempSrc: Oral  Oral   SpO2: 97% 95% 96% 95%    Chronically ill elderly man in no distress Regular rate and rhythm Unlabored breathing 2+ R  femoral pulse Absent L femoral pulse No pedal pulses R TMA Ulcer about L medial 1st MTP joint  PERTINENT LABORATORY AND RADIOLOGIC DATA  Most recent CBC    Latest Ref Rng & Units 08/23/2023   10:31 AM 08/22/2023    5:36 PM 07/03/2023   11:26 AM  CBC  WBC 4.0 - 10.5 K/uL 10.2  11.0  8.6   Hemoglobin 13.0 - 17.0 g/dL 16.1  09.6  04.5   Hematocrit 39.0 - 52.0 % 39.1  40.9  47.0   Platelets 150 - 400 K/uL 178  199  162      Most recent CMP    Latest Ref Rng & Units 08/23/2023   10:31 AM 08/22/2023    5:36 PM 07/03/2023   11:26 AM  CMP  Glucose 70 - 99 mg/dL 409  811  94   BUN 8 - 23 mg/dL 16  17  23    Creatinine 0.61 - 1.24 mg/dL 9.14  7.82  9.56   Sodium 135 - 145 mmol/L 137  138  137   Potassium 3.5 - 5.1 mmol/L 4.0  4.1  4.2   Chloride 98 - 111 mmol/L 101  98  103   CO2 22 - 32 mmol/L 24  28  28    Calcium 8.9 - 10.3 mg/dL 8.5  8.8  9.4   Total Protein 6.5 - 8.1 g/dL  7.5  7.8   Total Bilirubin 0.3 - 1.2 mg/dL  0.6  0.6   Alkaline Phos 38 - 126 U/L  68  99   AST 15 - 41 U/L  15  13   ALT 0 - 44 U/L  21  15     Renal function CrCl cannot be calculated (Unknown ideal weight.).  Hgb A1c MFr Bld (%)  Date Value  08/22/2023 6.1 (H)    LDL Cholesterol (Calc)  Date Value Ref Range Status  08/01/2018 80 mg/dL (calc) Final    Comment:    Reference range: <100 . Desirable range <100 mg/dL for primary prevention;   <70 mg/dL for patients with CHD or diabetic patients  with > or = 2 CHD risk factors. Marland Kitchen LDL-C is now calculated using the Martin-Hopkins  calculation, which is a validated novel method providing  better accuracy than the Friedewald equation in the  estimation of LDL-C.  Horald Pollen et al. Lenox Ahr. 2130;865(78): 2061-2068  (http://education.[facility].com/faq/FAQ164)    LDL Chol Calc (NIH)  Date Value Ref Range Status  12/12/2022 73 0 - 99 mg/dL Final    MRI  L foot 1. Acute osteomyelitis of the first metatarsal head and base of the great toe proximal phalanx. 2. Small first MTP joint effusion, likely septic arthritis. 3. Bipartite tibial hallux sesamoid with marrow edema, highly suspicious for osteomyelitis. 4. Mild marrow edema within the fibular hallux sesamoid, nonspecific. 5. Soft tissue wound along the medial aspect of the forefoot adjacent to the first metatarsal head. No organized or drainable fluid collection.   +-------+-----------+-----------+------------+------------+  ABI/TBIToday's ABIToday's TBIPrevious ABIPrevious TBI  +-------+-----------+-----------+------------+------------+  Right 0.43       amputation 1.27        amputation    +-------+-----------+-----------+------------+------------+  Left  0.57       0.19       0.82        absent        +-------+-----------+-----------+------------+------------+   Angiogram from 2020 personally reviewed. 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   Rande Brunt. Lenell Antu, MD FACS [facility] Phone Number: [phone] 08/24/2023 3:19 PM   Total time spent on preparing this encounter including chart review, data review, collecting history, examining the patient, coordinating care for this established patient, 40 minutes.  Portions of this report may have been transcribed using voice recognition software.  Every effort has been made to ensure accuracy; however, inadvertent computerized transcription errors may still be present.

## 2023-08-24 NOTE — Progress Notes (Signed)
   PODIATRY PROGRESS NOTE  NAME Raymond Walker. MRN [id number] DOB 14-Mar-1951 DOA 08/22/2023   ABIs reviewed. MRI pending final read. Recommend Vascular Consult. Will follow peripherally until patient has been optimized from a vascular standpoint.   Continue IV abx as ordered. Dressing changes PRN.    Felecia Shelling, DPM [facility]  Dr. Felecia Shelling, DPM    [address] [place], [place] [address]                Office [phone]  Fax [phone]

## 2023-08-24 NOTE — Progress Notes (Signed)
PROGRESS NOTE    Raymond Walker.  UXN:[id number] DOB: [birth year] DOA: 08/22/2023 PCP: Babs Sciara, MD   Brief Narrative: 72 year old with past medical history significant for peripheral artery disease, hypertension, COPD, Seizure presented to the ED complaining of increasing pain and redness at the site of his chronic left foot wound.  X-ray of the left foot showed osteomyelitis involving the medial aspect of the first metatarsal head and medial aspect of the base of the first proximal phalanx with overlying soft tissue ulceration.   Assessment & Plan:   Principal Problem:   Osteomyelitis of foot, left, acute (HCC) Active Problems:   COPD (chronic obstructive pulmonary disease) (HCC)   PAD (peripheral artery disease) (HCC)   Essential hypertension   Status post transmetatarsal amputation of foot, right (HCC)   1-Left Foot Osteomyelitis: -X-ray of the left foot showed osteomyelitis involving the medial aspect of the first metatarsal head and medial aspect of the base of the first proximal phalanx with overlying soft tissue ulceration. -Podiatry  consulted. -MRI of the left foot ordered and pending -ABIs ordered: Right: Resting right ankle-brachial index indicates severe right lower  extremity arterial disease.  Left: Resting left ankle-brachial index indicates moderate/severe left  lower extremity arterial disease. The left toe-brachial index is abnormal.  -Continue with  IV vancomycin, ceftriaxone, Flagyl -ESR: 76---31. CRP: 10. -Tramadol PRN for pain.   Essential hypertension: -Not on medications at home.  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   PVD; Abnormal ABI. Vascular consulted.   Estimated body mass index is 21.7 kg/m as calculated from the following:   Height as of 07/16/23: 6' (1.829 m).   Weight as of 07/16/23: 72.6 kg.   DVT  prophylaxis: Lovenox Code Status: DNR limited Family Communication: care discussed with patient.  Disposition Plan:  Status is: Inpatient Remains inpatient appropriate because: management of left foot infection     Consultants:  Dr Germaine Pomfret  Procedures:  None  Antimicrobials:    Subjective: He is alert, he is irritable today. Denies pain.    Objective: Vitals:   08/23/23 0750 08/23/23 1237 08/23/23 1956 08/24/23 0322  BP: (!) 142/54 (!) 123/56 137/60 (!) 125/46  Pulse: 94 85 88 80  Resp: 17 18 19 18   Temp: 98.9 F (37.2 C) 98.3 F (36.8 C) 98 F (36.7 C) 98.1 F (36.7 C)  TempSrc: Oral  Oral Oral  SpO2: 94% 98% 98% 97%    Intake/Output Summary (Last 24 hours) at 08/24/2023 0745 Last data filed at 08/24/2023 0308 Gross per 24 hour  Intake --  Output 1000 ml  Net -1000 ml   There were no vitals filed for this visit.  Examination:  General exam: NAD Respiratory system: CTA Cardiovascular system: S 1, S 2 RRR Gastrointestinal system: BS present, soft nt Central nervous system alert Extremities: left foot with redness 2 dry ulcer near big toe  Data Reviewed: I have personally reviewed following labs and imaging studies  CBC: Recent Labs  Lab 08/22/23 1736 08/23/23 1031  WBC 11.0* 10.2  NEUTROABS 8.1*  --   HGB 13.4 13.1  HCT 40.9 39.1  MCV 89.5 86.5  PLT 199 178   Basic Metabolic Panel: Recent Labs  Lab 08/22/23 1736 08/23/23 1031  NA 138 137  K 4.1 4.0  CL 98 101  CO2 28 24  GLUCOSE 115* 182*  BUN 17 16  CREATININE 1.17 1.15  CALCIUM 8.8* 8.5*   GFR: CrCl cannot be calculated (Unknown ideal weight.). Liver Function Tests: Recent Labs  Lab 08/22/23 1736  AST 15  ALT 21  ALKPHOS 68  BILITOT 0.6  PROT 7.5  ALBUMIN 3.3*   No results for input(s): "LIPASE", "AMYLASE" in the last 168 hours. No results for input(s): "AMMONIA" in the last 168 hours. Coagulation Profile: No results for input(s): "INR", "PROTIME" in the last 168  hours. Cardiac Enzymes: No results for input(s): "CKTOTAL", "CKMB", "CKMBINDEX", "TROPONINI" in the last 168 hours. BNP (last 3 results) No results for input(s): "PROBNP" in the last 8760 hours. HbA1C: Recent Labs    08/22/23 2059  HGBA1C 6.1*   CBG: No results for input(s): "GLUCAP" in the last 168 hours. Lipid Profile: No results for input(s): "CHOL", "HDL", "LDLCALC", "TRIG", "CHOLHDL", "LDLDIRECT" in the last 72 hours. Thyroid Function Tests: No results for input(s): "TSH", "T4TOTAL", "FREET4", "T3FREE", "THYROIDAB" in the last 72 hours. Anemia Panel: No results for input(s): "VITAMINB12", "FOLATE", "FERRITIN", "TIBC", "IRON", "RETICCTPCT" in the last 72 hours. Sepsis Labs: Recent Labs  Lab 08/22/23 1736  LATICACIDVEN 1.8    Recent Results (from the past 240 hour(s))  Blood Cultures x 2 sites     Status: None (Preliminary result)   Collection Time: 08/22/23  5:36 PM   Specimen: BLOOD LEFT ARM  Result Value Ref Range Status   Specimen Description BLOOD LEFT ARM  Final   Special Requests   Final    BOTTLES DRAWN AEROBIC AND ANAEROBIC Blood Culture adequate volume   Culture   Final    NO GROWTH < 24 HOURS Performed at [facility], [address], [place], [place] [address]    Report Status PENDING  Incomplete  Blood Cultures x 2 sites     Status: None (Preliminary result)   Collection Time: 08/22/23  6:27 PM   Specimen: BLOOD  Result Value Ref Range Status   Specimen Description BLOOD RIGHT ANTECUBITAL  Final   Special Requests   Final    BOTTLES DRAWN AEROBIC AND ANAEROBIC Blood Culture adequate volume   Culture   Final    NO GROWTH < 12 HOURS Performed at [facility], [address], [place], [place] [address]    Report Status PENDING  Incomplete         Radiology Studies: VAS [place] ABI WITH/WO TBI  Result Date: 08/23/2023  LOWER EXTREMITY DOPPLER STUDY Patient Name:  HAOYU FARNAN.  Date of Exam:   08/23/2023 Medical Rec #: [id number]           Accession  #:    [id number] Date of Birth: 06-15-1951            Patient Gender: M Patient Age:   2 years Exam Location:  [facility] Procedure:      VAS [place] ABI WITH/WO TBI Referring Phys: Ovid Curd --------------------------------------------------------------------------------  Indications: Ulceration/osteomyelitis of left foot. High Risk Factors: Hypertension, hyperlipidemia, current smoker. Other Factors: PAD, Hx of RLE TMA.  Vascular Interventions: RLE fem-pop bypass (09/15/2019), I&D of RLE fem-pop                         bypass site (01/26/2020). Comparison Study: Previous ABI exam on 06/29/2020 Performing Technologist: Ernestene Mention RVT, RDMS  Examination Guidelines: A complete evaluation includes at minimum, Doppler waveform signals and systolic blood pressure reading at the level of bilateral brachial, anterior tibial, and posterior tibial arteries, when  vessel segments are accessible. Bilateral testing is considered an integral part of a complete examination. Photoelectric Plethysmograph (PPG) waveforms and toe systolic pressure readings are included as required and additional duplex testing as needed. Limited examinations for reoccurring indications may be performed as noted.  ABI Findings: +---------+------------------+-----+-------------------+--------------------+ Right    Rt Pressure (mmHg)IndexWaveform           Comment              +---------+------------------+-----+-------------------+--------------------+ Brachial                        monophasic         restricted extremity +---------+------------------+-----+-------------------+--------------------+ PTA      65                0.43 monophasic                              +---------+------------------+-----+-------------------+--------------------+ DP       64                0.42 dampened monophasic                     +---------+------------------+-----+-------------------+--------------------+ Great Toe                                           TMA                  +---------+------------------+-----+-------------------+--------------------+ +---------+------------------+-----+-------------------+-------+ Left     Lt Pressure (mmHg)IndexWaveform           Comment +---------+------------------+-----+-------------------+-------+ Brachial 151                    biphasic                   +---------+------------------+-----+-------------------+-------+ PTA      86                0.57 monophasic                 +---------+------------------+-----+-------------------+-------+ DP       66                0.44 dampened monophasic        +---------+------------------+-----+-------------------+-------+ Great Toe29                0.19 Abnormal                   +---------+------------------+-----+-------------------+-------+ +-------+-----------+-----------+------------+------------+ ABI/TBIToday's ABIToday's TBIPrevious ABIPrevious TBI +-------+-----------+-----------+------------+------------+ Right  0.43       amputation 1.27        amputation   +-------+-----------+-----------+------------+------------+ Left   0.57       0.19       0.82        absent       +-------+-----------+-----------+------------+------------+  Bilateral ABIs appear decreased.  Summary: Right: Resting right ankle-brachial index indicates severe right lower extremity arterial disease. Left: Resting left ankle-brachial index indicates moderate/severe left lower extremity arterial disease. The left toe-brachial index is abnormal. *See table(s) above for measurements and observations.  Suggest Peripheral Vascular Consult.    Preliminary    DG Foot Complete Left  Result Date: 08/22/2023 CLINICAL DATA:  Left inner foot wound. Previous right foot partial amputation. EXAM: LEFT FOOT - COMPLETE 3+ VIEW  COMPARISON:  None Available. FINDINGS: Shallow soft tissue ulceration in the medial aspect of the left foot at the level of  the 1st MTP joint. Bone destruction involving the medial aspect of the 1st metatarsal head and medial aspect of the base of the 1st proximal phalanx. No soft tissue gas, fracture or dislocation. IMPRESSION: Osteomyelitis involving the medial aspect of the 1st metatarsal head and medial aspect of the base of the 1st proximal phalanx with an overlying soft tissue ulceration. Electronically Signed   By: Beckie Salts M.D.   On: 08/22/2023 16:27        Scheduled Meds:  azelastine  2 spray Each Nare BID   enoxaparin (LOVENOX) injection  40 mg Subcutaneous Q24H   mometasone-formoterol  2 puff Inhalation BID   pantoprazole  40 mg Oral Daily   rosuvastatin  5 mg Oral Daily   trimethoprim-polymyxin b  1 drop Both Eyes Q6H   Continuous Infusions:  cefTRIAXone (ROCEPHIN)  IV 2 g (08/23/23 1008)   metronidazole 500 mg (08/23/23 2114)   vancomycin       LOS: 2 days    Time spent: 35 minutes    Nami Strawder A Jarold Macomber, MD [facility]   If 7PM-7AM, please contact night-coverage [email or web address]  08/24/2023, 7:45 AM

## 2023-08-25 DIAGNOSIS — M86172 Other acute osteomyelitis, left ankle and foot: Secondary | ICD-10-CM | POA: Diagnosis not present

## 2023-08-25 LAB — VAS US ABI WITH/WO TBI
Left ABI: 0.57
Right ABI: 0.43

## 2023-08-25 MED ORDER — LACTATED RINGERS IV SOLN: INTRAVENOUS | Status: DC

## 2023-08-25 NOTE — Plan of Care (Signed)
  Problem: Clinical Measurements: Goal: Ability to maintain clinical measurements within normal limits will improve Outcome: Progressing Goal: Will remain free from infection Outcome: Progressing Goal: Diagnostic test results will improve Outcome: Progressing   Problem: Nutrition: Goal: Adequate nutrition will be maintained Outcome: Progressing   Problem: Coping: Goal: Level of anxiety will decrease Outcome: Progressing   Problem: Safety: Goal: Ability to remain free from injury will improve Outcome: Progressing

## 2023-08-25 NOTE — Progress Notes (Signed)
Raymond Walker. is a 72 y.o. male with atherosclerosis of native arteries of left lower extremity causing ulceration.   Recommend:  Abstinence from all tobacco products. Blood glucose control with goal A1c < 7%. Blood pressure control with goal blood pressure < 140/90 mmHg. Lipid reduction therapy with goal LDL-C <100 mg/dL Aspirin 81mg  PO QD.  Atorvastatin 40-80mg  PO QD (or other "high intensity" statin therapy).   Plan left lower extremity angiogram with possible intervention via right common femoral approach in cath lab 08/26/23 with Dr. Randie Heinz.   Raymond Walker. Raymond Antu, MD [facility] Phone Number: [phone] 08/25/2023 10:48 AM

## 2023-08-25 NOTE — Progress Notes (Addendum)
Pt refused eye drops, nasal spray, and breathing treatment. Pt is very irritable and verbalized to me that he does not want to be bothered with anything. I have asked him several times if he is okay or if needs anything and does not responds, IV goes off he gets angry, and yells to turn that S off. Pt was educated on IV fluids, no evidence of learning.

## 2023-08-25 NOTE — Progress Notes (Signed)
PROGRESS NOTE    Raymond Walker.  XLK:[id number] DOB: 1951/04/13 DOA: 08/22/2023 PCP: Babs Sciara, MD   Brief Narrative: 72 year old with past medical history significant for peripheral artery disease, hypertension, COPD, Seizure presented to the ED complaining of increasing pain and redness at the site of his chronic left foot wound.  X-ray of the left foot showed osteomyelitis involving the medial aspect of the first metatarsal head and medial aspect of the base of the first proximal phalanx with overlying soft tissue ulceration.   Assessment & Plan:   Principal Problem:   Osteomyelitis of foot, left, acute (HCC) Active Problems:   COPD (chronic obstructive pulmonary disease) (HCC)   PAD (peripheral artery disease) (HCC)   Essential hypertension   Status post transmetatarsal amputation of foot, right (HCC)   1-Left Foot Osteomyelitis: -X-ray of the left foot showed osteomyelitis involving the medial aspect of the first metatarsal head and medial aspect of the base of the first proximal phalanx with overlying soft tissue ulceration. -Podiatry  consulted. -MRI of the left foot ordered and pending -ABIs ordered: Right: Resting right ankle-brachial index indicates severe right lower  extremity arterial disease.  Left: Resting left ankle-brachial index indicates moderate/severe left  lower extremity arterial disease. The left toe-brachial index is abnormal.  -Continue with  IV vancomycin, ceftriaxone, Flagyl -ESR: 76---31. CRP: 10. -Tramadol PRN for pain.  For arteriogram tomorrow.   Essential hypertension: -Not on medications at home.  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   PVD; Abnormal ABI. Vascular consulted. For arteriogram tomorrow.   Estimated body mass index is 21.7 kg/m as calculated from the following:   Height as of 07/16/23: 6' (1.829  m).   Weight as of 07/16/23: 72.6 kg.   DVT prophylaxis: Lovenox Code Status: DNR limited Family Communication: care discussed with patient.  Disposition Plan:  Status is: Inpatient Remains inpatient appropriate because: management of left foot infection     Consultants:  Dr Germaine Pomfret  Procedures:  None  Antimicrobials:    Subjective: He is alert, report foot pain.  He is asking for new gown.    Objective: Vitals:   08/24/23 0901 08/24/23 1154 08/24/23 2115 08/25/23 0600  BP: 129/67 132/67 132/80 138/74  Pulse: 88 78 92 88  Resp: 14 18 16 16   Temp: 98.3 F (36.8 C) 98 F (36.7 C) 98.4 F (36.9 C) 98.7 F (37.1 C)  TempSrc: Oral  Oral Oral  SpO2: 96% 95% 94% 94%    Intake/Output Summary (Last 24 hours) at 08/25/2023 0749 Last data filed at 08/25/2023 3664 Gross per 24 hour  Intake --  Output 1150 ml  Net -1150 ml   There were no vitals filed for this visit.  Examination:  General exam: NAD Respiratory system: CTA Cardiovascular system: S 1, S 2 RRR Gastrointestinal system: BS present, soft, nt Central nervous system: Alert  Extremities: left foot with redness 2 dry ulcer near big toe  Data Reviewed: I have personally reviewed following labs and imaging studies  CBC: Recent Labs  Lab 08/22/23 1736 08/23/23 1031  WBC 11.0* 10.2  NEUTROABS 8.1*  --   HGB 13.4 13.1  HCT 40.9 39.1  MCV 89.5 86.5  PLT 199 178   Basic Metabolic Panel: Recent Labs  Lab 08/22/23 1736 08/23/23 1031  NA 138 137  K 4.1 4.0  CL 98 101  CO2 28  24  GLUCOSE 115* 182*  BUN 17 16  CREATININE 1.17 1.15  CALCIUM 8.8* 8.5*   GFR: CrCl cannot be calculated (Unknown ideal weight.). Liver Function Tests: Recent Labs  Lab 08/22/23 1736  AST 15  ALT 21  ALKPHOS 68  BILITOT 0.6  PROT 7.5  ALBUMIN 3.3*   No results for input(s): "LIPASE", "AMYLASE" in the last 168 hours. No results for input(s): "AMMONIA" in the last 168 hours. Coagulation Profile: No results for  input(s): "INR", "PROTIME" in the last 168 hours. Cardiac Enzymes: No results for input(s): "CKTOTAL", "CKMB", "CKMBINDEX", "TROPONINI" in the last 168 hours. BNP (last 3 results) No results for input(s): "PROBNP" in the last 8760 hours. HbA1C: Recent Labs    08/22/23 2059  HGBA1C 6.1*   CBG: No results for input(s): "GLUCAP" in the last 168 hours. Lipid Profile: No results for input(s): "CHOL", "HDL", "LDLCALC", "TRIG", "CHOLHDL", "LDLDIRECT" in the last 72 hours. Thyroid Function Tests: No results for input(s): "TSH", "T4TOTAL", "FREET4", "T3FREE", "THYROIDAB" in the last 72 hours. Anemia Panel: Recent Labs    08/24/23 0729  VITAMINB12 155*   Sepsis Labs: Recent Labs  Lab 08/22/23 1736  LATICACIDVEN 1.8    Recent Results (from the past 240 hour(s))  Blood Cultures x 2 sites     Status: None (Preliminary result)   Collection Time: 08/22/23  5:36 PM   Specimen: BLOOD LEFT ARM  Result Value Ref Range Status   Specimen Description BLOOD LEFT ARM  Final   Special Requests   Final    BOTTLES DRAWN AEROBIC AND ANAEROBIC Blood Culture adequate volume   Culture   Final    NO GROWTH 3 DAYS Performed at [facility], [address], [place], [place] [address]    Report Status PENDING  Incomplete  Blood Cultures x 2 sites     Status: None (Preliminary result)   Collection Time: 08/22/23  6:27 PM   Specimen: BLOOD  Result Value Ref Range Status   Specimen Description BLOOD RIGHT ANTECUBITAL  Final   Special Requests   Final    BOTTLES DRAWN AEROBIC AND ANAEROBIC Blood Culture adequate volume   Culture   Final    NO GROWTH 3 DAYS Performed at [facility], [address], [place], [place] [address]    Report Status PENDING  Incomplete         Radiology Studies: MR FOOT LEFT WO CONTRAST  Result Date: 08/24/2023 CLINICAL DATA:  Foot swelling, diabetic, osteomyelitis suspected, xray done EXAM: MRI OF THE LEFT FOOT WITHOUT CONTRAST TECHNIQUE: Multiplanar, multisequence MR  imaging of the left forefoot was performed. No intravenous contrast was administered. COMPARISON:  X-ray 08/22/2023 FINDINGS: Bones/Joint/Cartilage Erosive changes along the medial aspect of the first metatarsal head and medial base of the great toe proximal phalanx. Adjacent bone marrow edema and confluent low T1 signal changes, compatible with acute osteomyelitis. Small first MTP joint effusion, likely septic arthritis. Bipartite tibial hallux sesamoid with marrow edema and intermediate T1 marrow signal within the sesamoid, highly suspicious for osteomyelitis. Mild marrow edema within the fibular hallux sesamoid, nonspecific. No additional sites of marrow edema or marrow replacement. No fracture or dislocation. Ligaments Intact Lisfranc ligament.  Intact collateral ligaments. Muscles and Tendons Denervation changes of the foot musculature.  No tenosynovitis. Soft tissues Soft tissue wound along the medial aspect of the forefoot adjacent to the first metatarsal head. Soft tissue swelling. No organized or drainable fluid collection. IMPRESSION: 1. Acute osteomyelitis of the first metatarsal head and base  of the great toe proximal phalanx. 2. Small first MTP joint effusion, likely septic arthritis. 3. Bipartite tibial hallux sesamoid with marrow edema, highly suspicious for osteomyelitis. 4. Mild marrow edema within the fibular hallux sesamoid, nonspecific. 5. Soft tissue wound along the medial aspect of the forefoot adjacent to the first metatarsal head. No organized or drainable fluid collection. Electronically Signed   By: Duanne Guess D.O.   On: 08/24/2023 13:14   VAS [place] ABI WITH/WO TBI  Result Date: 08/23/2023  LOWER EXTREMITY DOPPLER STUDY Patient Name:  Raymond Walker.  Date of Exam:   08/23/2023 Medical Rec #: [id number]           Accession #:    [id number] Date of Birth: February 22, 1951            Patient Gender: M Patient Age:   68 years Exam Location:  [facility] Procedure:      VAS [place] ABI WITH/WO  TBI Referring Phys: Ovid Curd --------------------------------------------------------------------------------  Indications: Ulceration/osteomyelitis of left foot. High Risk Factors: Hypertension, hyperlipidemia, current smoker. Other Factors: PAD, Hx of RLE TMA.  Vascular Interventions: RLE fem-pop bypass (09/15/2019), I&D of RLE fem-pop                         bypass site (01/26/2020). Comparison Study: Previous ABI exam on 06/29/2020 Performing Technologist: Ernestene Mention RVT, RDMS  Examination Guidelines: A complete evaluation includes at minimum, Doppler waveform signals and systolic blood pressure reading at the level of bilateral brachial, anterior tibial, and posterior tibial arteries, when vessel segments are accessible. Bilateral testing is considered an integral part of a complete examination. Photoelectric Plethysmograph (PPG) waveforms and toe systolic pressure readings are included as required and additional duplex testing as needed. Limited examinations for reoccurring indications may be performed as noted.  ABI Findings: +---------+------------------+-----+-------------------+--------------------+ Right    Rt Pressure (mmHg)IndexWaveform           Comment              +---------+------------------+-----+-------------------+--------------------+ Brachial                        monophasic         restricted extremity +---------+------------------+-----+-------------------+--------------------+ PTA      65                0.43 monophasic                              +---------+------------------+-----+-------------------+--------------------+ DP       64                0.42 dampened monophasic                     +---------+------------------+-----+-------------------+--------------------+ Great Toe                                          TMA                  +---------+------------------+-----+-------------------+--------------------+  +---------+------------------+-----+-------------------+-------+ Left     Lt Pressure (mmHg)IndexWaveform           Comment +---------+------------------+-----+-------------------+-------+ Brachial 151                    biphasic                   +---------+------------------+-----+-------------------+-------+  PTA      86                0.57 monophasic                 +---------+------------------+-----+-------------------+-------+ DP       66                0.44 dampened monophasic        +---------+------------------+-----+-------------------+-------+ Great Toe29                0.19 Abnormal                   +---------+------------------+-----+-------------------+-------+ +-------+-----------+-----------+------------+------------+ ABI/TBIToday's ABIToday's TBIPrevious ABIPrevious TBI +-------+-----------+-----------+------------+------------+ Right  0.43       amputation 1.27        amputation   +-------+-----------+-----------+------------+------------+ Left   0.57       0.19       0.82        absent       +-------+-----------+-----------+------------+------------+  Bilateral ABIs appear decreased.  Summary: Right: Resting right ankle-brachial index indicates severe right lower extremity arterial disease. Left: Resting left ankle-brachial index indicates moderate/severe left lower extremity arterial disease. The left toe-brachial index is abnormal. *See table(s) above for measurements and observations.  Suggest Peripheral Vascular Consult.    Preliminary         Scheduled Meds:  azelastine  2 spray Each Nare BID   cyanocobalamin  1,000 mcg Intramuscular Daily   [START ON 08/29/2023] vitamin B-12  1,000 mcg Oral Daily   enoxaparin (LOVENOX) injection  40 mg Subcutaneous Q24H   mometasone-formoterol  2 puff Inhalation BID   pantoprazole  40 mg Oral Daily   rosuvastatin  5 mg Oral Daily   trimethoprim-polymyxin b  1 drop Both Eyes Q6H   Continuous  Infusions:  cefTRIAXone (ROCEPHIN)  IV 2 g (08/24/23 0817)   metronidazole 500 mg (08/24/23 2141)   vancomycin 1,250 mg (08/24/23 1159)     LOS: 3 days    Time spent: 35 minutes    Raymond Beckers A Aurel Nguyen, MD [facility]   If 7PM-7AM, please contact night-coverage [email or web address]  08/25/2023, 7:49 AM

## 2023-08-26 ENCOUNTER — Encounter (HOSPITAL_COMMUNITY)
Admission: EM | Disposition: A | Discharge status: Home Health | Payer: Self-pay | Source: Skilled Nursing Facility | Attending: Internal Medicine

## 2023-08-26 DIAGNOSIS — M86172 Other acute osteomyelitis, left ankle and foot: Secondary | ICD-10-CM | POA: Diagnosis not present

## 2023-08-26 DIAGNOSIS — I70245 Atherosclerosis of native arteries of left leg with ulceration of other part of foot: Secondary | ICD-10-CM

## 2023-08-26 HISTORY — PX: PERIPHERAL VASCULAR INTERVENTION: CATH118257

## 2023-08-26 HISTORY — PX: ABDOMINAL AORTOGRAM W/LOWER EXTREMITY: CATH118223

## 2023-08-26 LAB — BASIC METABOLIC PANEL
Anion gap: 10 (ref 5–15)
BUN: 19 mg/dL (ref 8–23)
CO2: 23 mmol/L (ref 22–32)
Calcium: 8.5 mg/dL — ABNORMAL LOW (ref 8.9–10.3)
Chloride: 104 mmol/L (ref 98–111)
Creatinine, Ser: 1.03 mg/dL (ref 0.61–1.24)
GFR, Estimated: >60 mL/min (ref >60)
Glucose, Bld: 67 mg/dL — ABNORMAL LOW (ref 70–99)
Potassium: 4.3 mmol/L (ref 3.5–5.1)
Sodium: 137 mmol/L (ref 135–145)

## 2023-08-26 LAB — CBC
HCT: 40.1 % (ref 39.0–52.0)
Hemoglobin: 13 g/dL (ref 13.0–17.0)
MCH: 29 pg (ref 26.0–34.0)
MCHC: 32.4 g/dL (ref 30.0–36.0)
MCV: 89.3 fL (ref 80.0–100.0)
Platelets: 182 10*3/uL (ref 150–400)
RBC: 4.49 MIL/uL (ref 4.22–5.81)
RDW: 14 % (ref 11.5–15.5)
WBC: 7.6 10*3/uL (ref 4.0–10.5)
nRBC: 0 % (ref 0.0–0.2)

## 2023-08-26 LAB — GLUCOSE, CAPILLARY: Glucose-Capillary: 96 mg/dL (ref 70–99)

## 2023-08-26 LAB — POCT ACTIVATED CLOTTING TIME
Activated Clotting Time: 232 s
Activated Clotting Time: 146 s

## 2023-08-26 SURGERY — ABDOMINAL AORTOGRAM W/LOWER EXTREMITY
Anesthesia: LOCAL | Laterality: Right

## 2023-08-26 MED ORDER — LIDOCAINE HCL (PF) 1 % IJ SOLN
INTRAMUSCULAR | Status: DC | PRN
  Administered 2023-08-26: 15 mL

## 2023-08-26 MED ORDER — FENTANYL CITRATE (PF) 100 MCG/2ML IJ SOLN
INTRAMUSCULAR | Status: DC | PRN
  Administered 2023-08-26: 50 ug via INTRAVENOUS
  Administered 2023-08-26: 25 ug via INTRAVENOUS

## 2023-08-26 MED ORDER — CLOPIDOGREL BISULFATE 75 MG PO TABS
75.0000 mg | ORAL_TABLET | Freq: Every day | ORAL | Status: DC
  Administered 2023-08-27 – 2023-08-30 (×4): 75 mg via ORAL
  Filled 2023-08-26 (×4): qty 1

## 2023-08-26 MED ORDER — MORPHINE SULFATE (PF) 2 MG/ML IV SOLN: 2.0000 mg | INTRAVENOUS | Status: DC | PRN

## 2023-08-26 MED ORDER — SODIUM CHLORIDE 0.9 % IV SOLN: INTRAVENOUS | Status: DC

## 2023-08-26 MED ORDER — DEXTROSE IN LACTATED RINGERS 5 % IV SOLN: INTRAVENOUS | Status: DC

## 2023-08-26 MED ORDER — CLOPIDOGREL BISULFATE 300 MG PO TABS
ORAL_TABLET | ORAL | Status: AC
  Filled 2023-08-26: qty 1

## 2023-08-26 MED ORDER — LIDOCAINE HCL (PF) 1 % IJ SOLN
INTRAMUSCULAR | Status: AC
  Filled 2023-08-26: qty 30

## 2023-08-26 MED ORDER — HEPARIN SODIUM (PORCINE) 1000 UNIT/ML IJ SOLN
INTRAMUSCULAR | Status: DC | PRN
  Administered 2023-08-26: 5000 [IU] via INTRAVENOUS

## 2023-08-26 MED ORDER — ONDANSETRON HCL 4 MG/2ML IJ SOLN: 4.0000 mg | Freq: Four times a day (QID) | INTRAMUSCULAR | Status: DC | PRN

## 2023-08-26 MED ORDER — SODIUM CHLORIDE 0.9% FLUSH
3.0000 mL | Freq: Two times a day (BID) | INTRAVENOUS | Status: DC
  Administered 2023-08-27 – 2023-08-30 (×7): 3 mL via INTRAVENOUS

## 2023-08-26 MED ORDER — SODIUM CHLORIDE 0.9 % IV SOLN: 250.0000 mL | INTRAVENOUS | Status: DC | PRN

## 2023-08-26 MED ORDER — PROTAMINE SULFATE 10 MG/ML IV SOLN
INTRAVENOUS | Status: AC
  Filled 2023-08-26: qty 5

## 2023-08-26 MED ORDER — CLOPIDOGREL BISULFATE 75 MG PO TABS: 300.0000 mg | ORAL_TABLET | Freq: Once | ORAL | Status: DC

## 2023-08-26 MED ORDER — SODIUM CHLORIDE 0.9 % IV SOLN
2.0000 g | INTRAVENOUS | Status: DC
  Administered 2023-08-26 – 2023-08-28 (×3): 2 g via INTRAVENOUS
  Filled 2023-08-26 (×3): qty 20

## 2023-08-26 MED ORDER — MIDAZOLAM HCL 2 MG/2ML IJ SOLN
INTRAMUSCULAR | Status: AC
  Filled 2023-08-26: qty 2

## 2023-08-26 MED ORDER — HYDRALAZINE HCL 20 MG/ML IJ SOLN: 5.0000 mg | INTRAMUSCULAR | Status: DC | PRN

## 2023-08-26 MED ORDER — SODIUM CHLORIDE 0.9% FLUSH: 3.0000 mL | INTRAVENOUS | Status: DC | PRN

## 2023-08-26 MED ORDER — IODIXANOL 320 MG/ML IV SOLN
INTRAVENOUS | Status: DC | PRN
  Administered 2023-08-26: 100 mL

## 2023-08-26 MED ORDER — LABETALOL HCL 5 MG/ML IV SOLN: 10.0000 mg | INTRAVENOUS | Status: DC | PRN

## 2023-08-26 MED ORDER — FENTANYL CITRATE (PF) 100 MCG/2ML IJ SOLN
INTRAMUSCULAR | Status: AC
  Filled 2023-08-26: qty 2

## 2023-08-26 MED ORDER — ACETAMINOPHEN 325 MG PO TABS
650.0000 mg | ORAL_TABLET | ORAL | Status: DC | PRN
  Administered 2023-08-27 – 2023-08-30 (×3): 650 mg via ORAL
  Filled 2023-08-26 (×3): qty 2

## 2023-08-26 MED ORDER — OXYCODONE HCL 5 MG PO TABS
5.0000 mg | ORAL_TABLET | ORAL | Status: DC | PRN
  Administered 2023-08-26 – 2023-08-30 (×8): 10 mg via ORAL
  Filled 2023-08-26 (×7): qty 2

## 2023-08-26 MED ORDER — PROTAMINE SULFATE 10 MG/ML IV SOLN
INTRAVENOUS | Status: DC | PRN
  Administered 2023-08-26: 20 mg via INTRAVENOUS
  Administered 2023-08-26: 5 mg via INTRAVENOUS

## 2023-08-26 MED ORDER — ASPIRIN 81 MG PO CHEW
CHEWABLE_TABLET | ORAL | Status: DC | PRN
  Administered 2023-08-26: 81 mg via ORAL

## 2023-08-26 MED ORDER — ASPIRIN 81 MG PO TBEC
81.0000 mg | DELAYED_RELEASE_TABLET | Freq: Every day | ORAL | Status: DC
  Administered 2023-08-27 – 2023-08-30 (×4): 81 mg via ORAL
  Filled 2023-08-26 (×4): qty 1

## 2023-08-26 MED ORDER — MIDAZOLAM HCL 2 MG/2ML IJ SOLN
INTRAMUSCULAR | Status: DC | PRN
  Administered 2023-08-26: 1 mg via INTRAVENOUS

## 2023-08-26 MED ORDER — HEPARIN (PORCINE) IN NACL 1000-0.9 UT/500ML-% IV SOLN
INTRAVENOUS | Status: DC | PRN
  Administered 2023-08-26 (×2): 500 mL

## 2023-08-26 MED ORDER — ASPIRIN 81 MG PO CHEW
CHEWABLE_TABLET | ORAL | Status: AC
  Filled 2023-08-26: qty 1

## 2023-08-26 MED ORDER — SODIUM CHLORIDE 0.9 % WEIGHT BASED INFUSION
1.0000 mL/kg/h | INTRAVENOUS | Status: AC
  Administered 2023-08-26: 1 mL/kg/h via INTRAVENOUS

## 2023-08-26 MED ORDER — CLOPIDOGREL BISULFATE 300 MG PO TABS
ORAL_TABLET | ORAL | Status: DC | PRN
  Administered 2023-08-26: 300 mg via ORAL

## 2023-08-26 MED ORDER — SODIUM CHLORIDE 0.9 % IV SOLN: 2.0000 g | INTRAVENOUS | Status: DC

## 2023-08-26 SURGICAL SUPPLY — 16 items
BALLN MUSTANG 7.0X40 75 (BALLOONS) ×2 IMPLANT
BALLOON MUSTANG 7.0X40 75 (BALLOONS) IMPLANT
CATH OMNI FLUSH 5F 65CM (CATHETERS) IMPLANT
COVER DOME SNAP 22 D (MISCELLANEOUS) IMPLANT
KIT ENCORE 26 ADVANTAGE (KITS) IMPLANT
KIT MICROPUNCTURE NIT STIFF (SHEATH) IMPLANT
KIT SYRINGE INJ CVI SPIKEX1 (MISCELLANEOUS) IMPLANT
SET ATX-X65L (MISCELLANEOUS) IMPLANT
SHEATH CATAPULT 6FR 45 (SHEATH) IMPLANT
SHEATH PINNACLE 5F 10CM (SHEATH) IMPLANT
SHEATH PINNACLE 6F 10CM (SHEATH) IMPLANT
SHEATH PROBE COVER 6X72 (BAG) IMPLANT
STENT ZILVER PTX 8X40 (Permanent Stent) IMPLANT
TRAY PV CATH (CUSTOM PROCEDURE TRAY) ×2 IMPLANT
WIRE BENTSON .035X145CM (WIRE) IMPLANT
WIRE STARTER ROSEN .035X260 (WIRE) IMPLANT

## 2023-08-26 NOTE — Progress Notes (Signed)
SITE AREA: right groin/femoral  SITE PRIOR TO REMOVAL:  LEVEL 0  PRESSURE APPLIED FOR: approximately 30 minutes  MANUAL: yes, sheath removed by Leotis Shames, RN  PATIENT STATUS DURING PULL: stable  POST PULL SITE:  LEVEL 0  POST PULL INSTRUCTIONS GIVEN: yes, drsg to right groin x24 hours, apply manual pressure to right groin when coughing, may shower in 24 hours, no sitting in water, hot tubs, bath tubs, pools x 1 week, notify staff if you are bleeding or feel anything warm and moist in groin area, no running, jumping, take it easy on rle, especially on stairs or getting into or out of vehicles  POST PULL PULSES PRESENT: Bilateral post tibial pulses dopplerable  DRESSING APPLIED: gauze with tegaderm   BEDREST BEGINS @1915   COMMENTS:

## 2023-08-26 NOTE — Plan of Care (Signed)

## 2023-08-26 NOTE — Op Note (Signed)
Patient name: Raymond Walker. 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 Randie Heinz, MD Procedure Performed: 1.  Ultrasound-guided cannulation right common femoral artery 2.  Catheter aorta and aortogram with pelvic angiography 3.  Selection of the left common femoral artery and left lower extremity angiogram 4.  Right lower extremity angiogram 5.  Stent of left external iliac artery with 8 x 40 mm [place] postdilated with 7 mm balloon 6.  Moderate sedation with fentanyl Versed for 39 minutes  Indications: 72 year old male with a history of a right common femoral to above-knee popliteal artery bypass with transmetatarsal amputation that is now well-healed.  He now has left lower extremity ulceration with severely depressed ABIs and a toe pressure 29 mmHg.  He is indicated for angiography with possible intervention.  Findings: The aorta is free of flow-limiting stenosis.  There are right common and external iliac artery stents which are patent and the common femoral artery on the right is patent with an occluded bypass which reconstitutes the popliteal artery with runoff dominant via the posterior tibial artery.  The left side is the site of interest.  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  The SFA has a store takeoff and then it is occluded for a long segment reconstituting above the knee popliteal artery with dominant runoff via a large posterior tibial artery.  Plan will be for left common femoral endarterectomy with femoral to above-knee popliteal artery  bypass.   Procedure:  The patient was identified in the holding area and taken to room 8.  The patient was then placed supine on the table and prepped and draped in the usual sterile fashion.  A time out was called.  Ultrasound was used to evaluate the right common femoral artery.  This was patulous and patent.  The area was anesthetized 1% lidocaine and cannulated with a micropuncture needle followed by wire and a sheath.  An ultrasound image was saved to the permanent record.  Concomitantly we administered fentanyl and Versed and his vital signs were monitored throughout the case by bedside nursing.  We placed a Bentson wire followed by a 5 French sheath around the catheter at the level of L1 and performed aortogram and then pulled back the catheter and perform pelvic angiography.  I then crossed the bifurcation perform left lower extremity angiography.  With the above findings I elected to intervene on the left external iliac artery stenosis given that there was a large gradient across this and no palpable left common femoral pulse.  I first placed a Rosen wire and then a long 6 French sheath and 5000 units of heparin was administered.  I then primarily stented with an 8 x 40 mm drug-eluting stent postdilated with 7 mm balloon.  There was now no gradient across the stent and a bounding common femoral pulse on the left although with palpable calcification in the artery itself.  We then exchanged for a short 6 French sheath on the right and performed right lower extremity angiography.  The sheath will be pulled in postoperative holding given history of endarterectomy of  the right.  He tolerated the procedure without any complication.  Contrast: 100cc  Mariaceleste Herrera C. Randie Heinz, MD [facility] Office: [phone] Pager: [phone]

## 2023-08-26 NOTE — Progress Notes (Addendum)
Presents with L leg pain and redness, hx of peripheral artery disease, hypertension, COPD, seizures.  08/26/23 1022  TOC Brief Assessment  Insurance and Status Reviewed  Patient has primary care physician Yes  Home environment has been reviewed From home alone. Supportive niece, Zack Seal ( emergency contact )[phone]  Prior level of function: PTA independent without DME usage  Prior/Current Home Services No current home services  Social Determinants of Health Reivew SDOH reviewed no interventions necessary  Readmission risk has been reviewed No  Transition of care needs no transition of care needs at this time   Vascular and podiatry following ...  Plan: angiogram today. TOC team following and will assist with needs as they presents. Gae Gallop RN,BSN,CM [phone]

## 2023-08-26 NOTE — Progress Notes (Signed)
PROGRESS NOTE    Raymond Walker.  OZH:[id number] DOB: 11-16-1951 DOA: 08/22/2023 PCP: Babs Sciara, MD   Brief Narrative: 72 year old with past medical history significant for peripheral artery disease, hypertension, COPD, Seizure presented to the ED complaining of increasing pain and redness at the site of his chronic left foot wound.  X-ray of the left foot showed osteomyelitis involving the medial aspect of the first metatarsal head and medial aspect of the base of the first proximal phalanx with overlying soft tissue ulceration.   Assessment & Plan:   Principal Problem:   Osteomyelitis of foot, left, acute (HCC) Active Problems:   COPD (chronic obstructive pulmonary disease) (HCC)   PAD (peripheral artery disease) (HCC)   Essential hypertension   Status post transmetatarsal amputation of foot, right (HCC)   1-Left Foot Osteomyelitis: -X-ray of the left foot showed osteomyelitis involving the medial aspect of the first metatarsal head and medial aspect of the base of the first proximal phalanx with overlying soft tissue ulceration. -Podiatry  consulted. -MRI of the left foot ordered and pending -ABIs ordered: Right: Resting right ankle-brachial index indicates severe right lower  extremity arterial disease.  Left: Resting left ankle-brachial index indicates moderate/severe left  lower extremity arterial disease. The left toe-brachial index is abnormal.  -Continue with  IV vancomycin, ceftriaxone, Flagyl -ESR: 76---31. CRP: 10. -Tramadol PRN for pain.  For arteriogram today.   Essential hypertension: -Not on medications at home.  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   PVD; Abnormal ABI. Vascular consulted. For arteriogram tomorrow.  Hypoglycemia: start IV fluids.   Estimated body mass index is 22.75 kg/m as calculated from the following:    Height as of this encounter: 5\' 11"  (1.803 m).   Weight as of this encounter: 74 kg.   DVT prophylaxis: Lovenox Code Status: DNR limited Family Communication: care discussed with patient. Niece updated 9/08 Disposition Plan:  Status is: Inpatient Remains inpatient appropriate because: management of left foot infection     Consultants:  Dr Germaine Pomfret  Procedures:  None  Antimicrobials:    Subjective: He is alert, awaiting for procedure.    Objective: Vitals:   08/26/23 0409 08/26/23 0700 08/26/23 0821 08/26/23 1225  BP: (!) 145/62  (!) 127/96   Pulse: 93  91 89  Resp: 17  19   Temp: 98 F (36.7 C)  97.7 F (36.5 C)   TempSrc:   Oral   SpO2: 94%  95% 97%  Weight:  74 kg    Height:  5\' 11"  (1.803 m)      Intake/Output Summary (Last 24 hours) at 08/26/2023 1354 Last data filed at 08/26/2023 6295 Gross per 24 hour  Intake 1982.78 ml  Output 1100 ml  Net 882.78 ml   Filed Weights   08/26/23 0700  Weight: 74 kg    Examination:  General exam: NAD Respiratory system: CTA Cardiovascular system: S 1, S 2 RRR Gastrointestinal system: BS present, soft, nt Central nervous system: Alert.  Extremities: left foot with redness 2 dry ulcer near big toe  Data Reviewed: I have personally reviewed following labs and imaging studies  CBC: Recent Labs  Lab 08/22/23 1736 08/23/23 1031 08/26/23 0405  WBC 11.0* 10.2 7.6  NEUTROABS 8.1*  --   --   HGB 13.4 13.1 13.0  HCT 40.9 39.1 40.1  MCV 89.5 86.5 89.3  PLT 199  178 182   Basic Metabolic Panel: Recent Labs  Lab 08/22/23 1736 08/23/23 1031 08/26/23 0405  NA 138 137 137  K 4.1 4.0 4.3  CL 98 101 104  CO2 28 24 23   GLUCOSE 115* 182* 67*  BUN 17 16 19   CREATININE 1.17 1.15 1.03  CALCIUM 8.8* 8.5* 8.5*   GFR: Estimated Creatinine Clearance: 66.9 mL/min (by C-G formula based on SCr of 1.03 mg/dL). Liver Function Tests: Recent Labs  Lab 08/22/23 1736  AST 15  ALT 21  ALKPHOS 68  BILITOT 0.6  PROT 7.5   ALBUMIN 3.3*   No results for input(s): "LIPASE", "AMYLASE" in the last 168 hours. No results for input(s): "AMMONIA" in the last 168 hours. Coagulation Profile: No results for input(s): "INR", "PROTIME" in the last 168 hours. Cardiac Enzymes: No results for input(s): "CKTOTAL", "CKMB", "CKMBINDEX", "TROPONINI" in the last 168 hours. BNP (last 3 results) No results for input(s): "PROBNP" in the last 8760 hours. HbA1C: No results for input(s): "HGBA1C" in the last 72 hours.  CBG: No results for input(s): "GLUCAP" in the last 168 hours. Lipid Profile: No results for input(s): "CHOL", "HDL", "LDLCALC", "TRIG", "CHOLHDL", "LDLDIRECT" in the last 72 hours. Thyroid Function Tests: No results for input(s): "TSH", "T4TOTAL", "FREET4", "T3FREE", "THYROIDAB" in the last 72 hours. Anemia Panel: Recent Labs    08/24/23 0729  VITAMINB12 155*   Sepsis Labs: Recent Labs  Lab 08/22/23 1736  LATICACIDVEN 1.8    Recent Results (from the past 240 hour(s))  Blood Cultures x 2 sites     Status: None (Preliminary result)   Collection Time: 08/22/23  5:36 PM   Specimen: BLOOD LEFT ARM  Result Value Ref Range Status   Specimen Description BLOOD LEFT ARM  Final   Special Requests   Final    BOTTLES DRAWN AEROBIC AND ANAEROBIC Blood Culture adequate volume   Culture   Final    NO GROWTH 4 DAYS Performed at [facility], [address], [place], [place] [address]    Report Status PENDING  Incomplete  Blood Cultures x 2 sites     Status: None (Preliminary result)   Collection Time: 08/22/23  6:27 PM   Specimen: BLOOD  Result Value Ref Range Status   Specimen Description BLOOD RIGHT ANTECUBITAL  Final   Special Requests   Final    BOTTLES DRAWN AEROBIC AND ANAEROBIC Blood Culture adequate volume   Culture   Final    NO GROWTH 4 DAYS Performed at [facility], [address] [place], [place] [address]    Report Status PENDING  Incomplete         Radiology Studies: No results  found.      Scheduled Meds:  azelastine  2 spray Each Nare BID   cyanocobalamin  1,000 mcg Intramuscular Daily   [START ON 08/29/2023] vitamin B-12  1,000 mcg Oral Daily   enoxaparin (LOVENOX) injection  40 mg Subcutaneous Q24H   mometasone-formoterol  2 puff Inhalation BID   pantoprazole  40 mg Oral Daily   rosuvastatin  5 mg Oral Daily   trimethoprim-polymyxin b  1 drop Both Eyes Q6H   Continuous Infusions:  cefTRIAXone (ROCEPHIN)  IV 2 g (08/26/23 1238)   dextrose 5% lactated ringers     metronidazole 500 mg (08/26/23 1025)   vancomycin 1,250 mg (08/26/23 0812)     LOS: 4 days    Time spent: 35 minutes    Maizee Reinhold A Osinachi Navarrette, MD [facility]   If 7PM-7AM,  please contact night-coverage [email or web address]  08/26/2023, 1:54 PM

## 2023-08-26 NOTE — Progress Notes (Addendum)
  Progress Note    08/26/2023 10:41 AM * No surgery date entered *  Subjective: no overnight issues  Vitals:   08/26/23 0409 08/26/23 0821  BP: (!) 145/62 (!) 127/96  Pulse: 93 91  Resp: 17 19  Temp: 98 F (36.7 C) 97.7 F (36.5 C)  SpO2: 94% 95%    Physical Exam: Left foot ulcer is stable  CBC    Component Value Date/Time   WBC 10.2 08/23/2023 1031   RBC 4.52 08/23/2023 1031   HGB 13.1 08/23/2023 1031   HGB 15.7 12/12/2022 1137   HCT 39.1 08/23/2023 1031   HCT 46.6 12/12/2022 1137   PLT 178 08/23/2023 1031   PLT 139 (L) 12/12/2022 1137   MCV 86.5 08/23/2023 1031   MCV 87 12/12/2022 1137   MCH 29.0 08/23/2023 1031   MCHC 33.5 08/23/2023 1031   RDW 14.0 08/23/2023 1031   RDW 13.6 12/12/2022 1137   LYMPHSABS 1.5 08/22/2023 1736   LYMPHSABS 1.6 12/12/2022 1137   MONOABS 1.3 (H) 08/22/2023 1736   EOSABS 0.1 08/22/2023 1736   EOSABS 0.1 12/12/2022 1137   BASOSABS 0.1 08/22/2023 1736   BASOSABS 0.1 12/12/2022 1137    BMET    Component Value Date/Time   NA 137 08/23/2023 1031   NA 144 12/12/2022 1137   K 4.0 08/23/2023 1031   CL 101 08/23/2023 1031   CO2 24 08/23/2023 1031   GLUCOSE 182 (H) 08/23/2023 1031   BUN 16 08/23/2023 1031   BUN 19 12/12/2022 1137   CREATININE 1.15 08/23/2023 1031   CREATININE 0.70 08/01/2018 1126   CALCIUM 8.5 (L) 08/23/2023 1031   GFRNONAA >60 08/23/2023 1031   GFRNONAA 97 08/01/2018 1126   GFRAA >60 08/24/2020 1221   GFRAA 112 08/01/2018 1126    INR    Component Value Date/Time   INR 1.3 (H) 05/21/2020 1829     Intake/Output Summary (Last 24 hours) at 08/26/2023 1041 Last data filed at 08/26/2023 0454 Gross per 24 hour  Intake 1982.78 ml  Output 1100 ml  Net 882.78 ml    A/P: 72 year old male with history of right lower extremity bypass and transmetatarsal amputation now with left foot ulceration.  I have spoken with and evaluated the patient this morning. He is scheduled for a left lower extremity angiogram today.  His left foot pain is stable currently.   He is agreeable to the procedure today. He has been NPO since midnight. Will proceed with angio this afternoon.  Loel Dubonnet, PA-C [facility] 08/26/2023 8:12AM  I have independently interviewed and examined patient and agree with PA assessment and plan above.   Tyne Banta C. Randie Heinz, MD [facility] [place] Office: [phone] Pager: [phone]

## 2023-08-26 NOTE — Plan of Care (Signed)
  Problem: Pain Managment: Goal: General experience of comfort will improve Outcome: Progressing   Problem: Safety: Goal: Ability to remain free from injury will improve Outcome: Progressing   Problem: Skin Integrity: Goal: Risk for impaired skin integrity will decrease Outcome: Progressing   

## 2023-08-27 ENCOUNTER — Inpatient Hospital Stay (HOSPITAL_COMMUNITY): Payer: Medicare HMO | Admitting: Anesthesiology

## 2023-08-27 ENCOUNTER — Encounter (HOSPITAL_COMMUNITY)
Admission: EM | Disposition: A | Discharge status: Home Health | Payer: Self-pay | Source: Skilled Nursing Facility | Attending: Internal Medicine

## 2023-08-27 ENCOUNTER — Encounter (HOSPITAL_COMMUNITY): Payer: Self-pay | Admitting: Vascular Surgery

## 2023-08-27 ENCOUNTER — Other Ambulatory Visit: Payer: Self-pay

## 2023-08-27 DIAGNOSIS — I70245 Atherosclerosis of native arteries of left leg with ulceration of other part of foot: Secondary | ICD-10-CM | POA: Diagnosis not present

## 2023-08-27 DIAGNOSIS — F17210 Nicotine dependence, cigarettes, uncomplicated: Secondary | ICD-10-CM

## 2023-08-27 DIAGNOSIS — M86172 Other acute osteomyelitis, left ankle and foot: Secondary | ICD-10-CM | POA: Diagnosis not present

## 2023-08-27 DIAGNOSIS — I70222 Atherosclerosis of native arteries of extremities with rest pain, left leg: Secondary | ICD-10-CM

## 2023-08-27 DIAGNOSIS — I10 Essential (primary) hypertension: Secondary | ICD-10-CM

## 2023-08-27 DIAGNOSIS — M869 Osteomyelitis, unspecified: Secondary | ICD-10-CM

## 2023-08-27 HISTORY — PX: ENDARTERECTOMY FEMORAL: SHX5804

## 2023-08-27 HISTORY — PX: FEMORAL-POPLITEAL BYPASS GRAFT: SHX937

## 2023-08-27 LAB — BASIC METABOLIC PANEL
Anion gap: 7 (ref 5–15)
BUN: 17 mg/dL (ref 8–23)
CO2: 24 mmol/L (ref 22–32)
Calcium: 8.6 mg/dL — ABNORMAL LOW (ref 8.9–10.3)
Chloride: 105 mmol/L (ref 98–111)
Creatinine, Ser: 1.02 mg/dL (ref 0.61–1.24)
GFR, Estimated: >60 mL/min (ref >60)
Glucose, Bld: 125 mg/dL — ABNORMAL HIGH (ref 70–99)
Potassium: 3.7 mmol/L (ref 3.5–5.1)
Sodium: 136 mmol/L (ref 135–145)

## 2023-08-27 LAB — CBC
HCT: 38.4 % — ABNORMAL LOW (ref 39.0–52.0)
Hemoglobin: 12.4 g/dL — ABNORMAL LOW (ref 13.0–17.0)
MCH: 27.9 pg (ref 26.0–34.0)
MCHC: 32.3 g/dL (ref 30.0–36.0)
MCV: 86.3 fL (ref 80.0–100.0)
Platelets: 192 10*3/uL (ref 150–400)
RBC: 4.45 MIL/uL (ref 4.22–5.81)
RDW: 14.2 % (ref 11.5–15.5)
WBC: 6.6 10*3/uL (ref 4.0–10.5)
nRBC: 0 % (ref 0.0–0.2)

## 2023-08-27 LAB — LIPID PANEL
Cholesterol: 58 mg/dL (ref 0–200)
HDL: 18 mg/dL — ABNORMAL LOW (ref >40)
LDL Cholesterol: 29 mg/dL (ref 0–99)
Total CHOL/HDL Ratio: 3.2 ratio
Triglycerides: 56 mg/dL (ref <150)
VLDL: 11 mg/dL (ref 0–40)

## 2023-08-27 LAB — SURGICAL PCR SCREEN
MRSA, PCR: NEGATIVE
Staphylococcus aureus: NEGATIVE

## 2023-08-27 LAB — TYPE AND SCREEN
ABO/RH(D): A POS
Antibody Screen: NEGATIVE

## 2023-08-27 LAB — POCT ACTIVATED CLOTTING TIME: Activated Clotting Time: 348 s

## 2023-08-27 SURGERY — BYPASS GRAFT FEMORAL-POPLITEAL ARTERY
Anesthesia: General | Site: Leg Upper | Laterality: Left

## 2023-08-27 MED ORDER — CHLORHEXIDINE GLUCONATE 0.12 % MT SOLN
OROMUCOSAL | Status: AC
  Administered 2023-08-27: 15 mL via OROMUCOSAL
  Filled 2023-08-27: qty 15

## 2023-08-27 MED ORDER — PROPOFOL 10 MG/ML IV BOLUS
INTRAVENOUS | Status: AC
  Filled 2023-08-27: qty 20

## 2023-08-27 MED ORDER — ONDANSETRON HCL 4 MG/2ML IJ SOLN
INTRAMUSCULAR | Status: DC | PRN
  Administered 2023-08-27: 4 mg via INTRAVENOUS

## 2023-08-27 MED ORDER — HYDROMORPHONE HCL 1 MG/ML IJ SOLN
0.2500 mg | INTRAMUSCULAR | Status: DC | PRN
  Administered 2023-08-27: 0.5 mg via INTRAVENOUS

## 2023-08-27 MED ORDER — ORAL CARE MOUTH RINSE: 15.0000 mL | Freq: Once | OROMUCOSAL | Status: AC

## 2023-08-27 MED ORDER — SURGIFLO WITH THROMBIN (HEMOSTATIC MATRIX KIT) OPTIME
TOPICAL | Status: DC | PRN
Start: 2023-08-27 — End: 2023-08-27
  Administered 2023-08-27: 1 via TOPICAL

## 2023-08-27 MED ORDER — ROCURONIUM BROMIDE 100 MG/10ML IV SOLN
INTRAVENOUS | Status: DC | PRN
Start: 2023-08-27 — End: 2023-08-27
  Administered 2023-08-27 (×2): 50 mg via INTRAVENOUS

## 2023-08-27 MED ORDER — HEPARIN SODIUM (PORCINE) 1000 UNIT/ML IJ SOLN
INTRAMUSCULAR | Status: DC | PRN
Start: 2023-08-27 — End: 2023-08-27
  Administered 2023-08-27: 8000 [IU] via INTRAVENOUS

## 2023-08-27 MED ORDER — HEPARIN SODIUM (PORCINE) 5000 UNIT/ML IJ SOLN
5000.0000 [IU] | Freq: Three times a day (TID) | INTRAMUSCULAR | Status: DC
  Administered 2023-08-28 (×3): 5000 [IU] via SUBCUTANEOUS
  Filled 2023-08-27 (×3): qty 1

## 2023-08-27 MED ORDER — ALBUMIN HUMAN 5 % IV SOLN
INTRAVENOUS | Status: DC | PRN
Start: 2023-08-27 — End: 2023-08-27

## 2023-08-27 MED ORDER — DEXAMETHASONE SODIUM PHOSPHATE 10 MG/ML IJ SOLN
INTRAMUSCULAR | Status: DC | PRN
  Administered 2023-08-27: 5 mg via INTRAVENOUS

## 2023-08-27 MED ORDER — FENTANYL CITRATE (PF) 250 MCG/5ML IJ SOLN
INTRAMUSCULAR | Status: AC
  Filled 2023-08-27: qty 5

## 2023-08-27 MED ORDER — SODIUM CHLORIDE 0.9 % IV SOLN: INTRAVENOUS | Status: DC

## 2023-08-27 MED ORDER — GLYCOPYRROLATE PF 0.2 MG/ML IJ SOSY
PREFILLED_SYRINGE | INTRAMUSCULAR | Status: AC
  Filled 2023-08-27: qty 1

## 2023-08-27 MED ORDER — ATROPINE SULFATE 0.4 MG/ML IV SOLN
INTRAVENOUS | Status: AC
  Filled 2023-08-27: qty 1

## 2023-08-27 MED ORDER — ACETAMINOPHEN 10 MG/ML IV SOLN
INTRAVENOUS | Status: DC | PRN
Start: 2023-08-27 — End: 2023-08-27
  Administered 2023-08-27: 1000 mg via INTRAVENOUS

## 2023-08-27 MED ORDER — LACTATED RINGERS IV SOLN: INTRAVENOUS | Status: DC

## 2023-08-27 MED ORDER — HEMOSTATIC AGENTS (NO CHARGE) OPTIME
TOPICAL | Status: DC | PRN
Start: 2023-08-27 — End: 2023-08-27
  Administered 2023-08-27: 2 via TOPICAL

## 2023-08-27 MED ORDER — PHENYLEPHRINE 80 MCG/ML (10ML) SYRINGE FOR IV PUSH (FOR BLOOD PRESSURE SUPPORT)
PREFILLED_SYRINGE | INTRAVENOUS | Status: DC | PRN
  Administered 2023-08-27: 80 ug via INTRAVENOUS
  Administered 2023-08-27: 160 ug via INTRAVENOUS
  Administered 2023-08-27: 80 ug via INTRAVENOUS

## 2023-08-27 MED ORDER — PROTAMINE SULFATE 10 MG/ML IV SOLN
INTRAVENOUS | Status: AC
  Filled 2023-08-27: qty 25

## 2023-08-27 MED ORDER — PROPOFOL 10 MG/ML IV BOLUS
INTRAVENOUS | Status: DC | PRN
  Administered 2023-08-27: 100 mg via INTRAVENOUS

## 2023-08-27 MED ORDER — HEPARIN 6000 UNIT IRRIGATION SOLUTION
Status: DC | PRN
  Administered 2023-08-27: 1

## 2023-08-27 MED ORDER — ACETAMINOPHEN 10 MG/ML IV SOLN
INTRAVENOUS | Status: AC
  Filled 2023-08-27: qty 100

## 2023-08-27 MED ORDER — CHLORHEXIDINE GLUCONATE 0.12 % MT SOLN: 15.0000 mL | Freq: Once | OROMUCOSAL | Status: AC

## 2023-08-27 MED ORDER — 0.9 % SODIUM CHLORIDE (POUR BTL) OPTIME
TOPICAL | Status: DC | PRN
  Administered 2023-08-27: 2000 mL

## 2023-08-27 MED ORDER — HEPARIN SODIUM (PORCINE) 1000 UNIT/ML IJ SOLN
INTRAMUSCULAR | Status: AC
  Filled 2023-08-27: qty 20

## 2023-08-27 MED ORDER — PROTAMINE SULFATE 10 MG/ML IV SOLN
INTRAVENOUS | Status: DC | PRN
Start: 2023-08-27 — End: 2023-08-27
  Administered 2023-08-27: 50 mg via INTRAVENOUS

## 2023-08-27 MED ORDER — SUGAMMADEX SODIUM 200 MG/2ML IV SOLN
INTRAVENOUS | Status: DC | PRN
  Administered 2023-08-27: 300 mg via INTRAVENOUS

## 2023-08-27 MED ORDER — HYDROMORPHONE HCL 1 MG/ML IJ SOLN
INTRAMUSCULAR | Status: AC
  Administered 2023-08-27: 1 mg
  Filled 2023-08-27: qty 1

## 2023-08-27 MED ORDER — PHENYLEPHRINE HCL-NACL 20-0.9 MG/250ML-% IV SOLN
INTRAVENOUS | Status: DC | PRN
  Administered 2023-08-27: 30 ug/min via INTRAVENOUS

## 2023-08-27 MED ORDER — FENTANYL CITRATE (PF) 250 MCG/5ML IJ SOLN
INTRAMUSCULAR | Status: DC | PRN
  Administered 2023-08-27 (×3): 50 ug via INTRAVENOUS

## 2023-08-27 MED ORDER — LIDOCAINE 2% (20 MG/ML) 5 ML SYRINGE
INTRAMUSCULAR | Status: DC | PRN
  Administered 2023-08-27: 60 mg via INTRAVENOUS

## 2023-08-27 SURGICAL SUPPLY — 44 items
ADH SKN CLS APL DERMABOND .7 (GAUZE/BANDAGES/DRESSINGS) ×6
AGENT HMST KT MTR STRL THRMB (HEMOSTASIS) ×2
BAG ISL DRAPE 18X18 STRL (DRAPES) ×2
BAG ISOLATION DRAPE 18X18 (DRAPES) IMPLANT
BANDAGE ESMARK 6X9 LF (GAUZE/BANDAGES/DRESSINGS) IMPLANT
BNDG CMPR 9X6 STRL LF SNTH (GAUZE/BANDAGES/DRESSINGS)
BNDG ESMARK 6X9 LF (GAUZE/BANDAGES/DRESSINGS) IMPLANT
CANISTER SUCT 3000ML PPV (MISCELLANEOUS) ×2 IMPLANT
CANNULA VESSEL 3MM 2 BLNT TIP (CANNULA) IMPLANT
CLIP LIGATING EXTRA MED SLVR (CLIP) ×2 IMPLANT
CLIP LIGATING EXTRA SM BLUE (MISCELLANEOUS) ×2 IMPLANT
COVER PROBE W GEL 5X96 (DRAPES) IMPLANT
DERMABOND ADVANCED .7 DNX12 (GAUZE/BANDAGES/DRESSINGS) ×2 IMPLANT
ELECT REM PT RETURN 9FT ADLT (ELECTROSURGICAL) ×2 IMPLANT
ELECTRODE REM PT RTRN 9FT ADLT (ELECTROSURGICAL) ×2 IMPLANT
GLOVE BIO SURGEON STRL SZ7.5 (GLOVE) ×2 IMPLANT
GOWN STRL REUS W/ TWL LRG LVL3 (GOWN DISPOSABLE) ×4 IMPLANT
GOWN STRL REUS W/ TWL XL LVL3 (GOWN DISPOSABLE) ×2 IMPLANT
GOWN STRL REUS W/TWL LRG LVL3 (GOWN DISPOSABLE) ×4
GOWN STRL REUS W/TWL XL LVL3 (GOWN DISPOSABLE) ×2
GRAFT PROPATEN W/RING 6X80X60 (Vascular Products) IMPLANT
INSERT FOGARTY SM (MISCELLANEOUS) IMPLANT
KIT BASIN OR (CUSTOM PROCEDURE TRAY) ×2 IMPLANT
KIT TURNOVER KIT B (KITS) ×2 IMPLANT
NS IRRIG 1000ML POUR BTL (IV SOLUTION) ×4 IMPLANT
PACK PERIPHERAL VASCULAR (CUSTOM PROCEDURE TRAY) ×2 IMPLANT
PAD ARMBOARD 7.5X6 YLW CONV (MISCELLANEOUS) ×4 IMPLANT
POWDER SURGICEL 3.0 GRAM (HEMOSTASIS) IMPLANT
SPONGE T-LAP 18X18 ~~LOC~~+RFID (SPONGE) IMPLANT
SURGIFLO W/THROMBIN 8M KIT (HEMOSTASIS) IMPLANT
SUT MNCRL AB 4-0 PS2 18 (SUTURE) ×4 IMPLANT
SUT PROLENE 5 0 C 1 24 (SUTURE) ×2 IMPLANT
SUT PROLENE 6 0 BV (SUTURE) ×2 IMPLANT
SUT SILK 2 0 SH (SUTURE) ×2 IMPLANT
SUT SILK 3 0 (SUTURE)
SUT SILK 3-0 18XBRD TIE 12 (SUTURE) IMPLANT
SUT VIC AB 2-0 CT1 27 (SUTURE) ×2
SUT VIC AB 2-0 CT1 TAPERPNT 27 (SUTURE) ×4 IMPLANT
SUT VIC AB 3-0 SH 27 (SUTURE) ×4
SUT VIC AB 3-0 SH 27X BRD (SUTURE) ×4 IMPLANT
TOWEL GREEN STERILE (TOWEL DISPOSABLE) ×2 IMPLANT
TRAY FOLEY MTR SLVR 16FR STAT (SET/KITS/TRAYS/PACK) ×2 IMPLANT
UNDERPAD 30X36 HEAVY ABSORB (UNDERPADS AND DIAPERS) ×2 IMPLANT
WATER STERILE IRR 1000ML POUR (IV SOLUTION) ×2 IMPLANT

## 2023-08-27 NOTE — Transfer of Care (Signed)
Immediate Anesthesia Transfer of Care Note  Patient: Raymond Walker  Procedure(s) Performed: BYPASS GRAFT LEFT COMMON FEMORAL-POPLITEAL ARTERY (Left: Leg Upper) RIGHT FEMORAL ENDARTERECTOMY (Left: Groin)  Patient Location: PACU  Anesthesia Type:General  Level of Consciousness: awake, oriented, and patient cooperative  Airway & Oxygen Therapy: Patient Spontanous Breathing and Patient connected to nasal cannula oxygen  Post-op Assessment: Report given to RN, Post -op Vital signs reviewed and stable, and Patient moving all extremities  Post vital signs: Reviewed and stable  Last Vitals:  Vitals Value Taken Time  BP    Temp    Pulse 77 08/27/23 1645  Resp 25 08/27/23 1645  SpO2 94 % 08/27/23 1645  Vitals shown include unfiled device data.  Last Pain:  Vitals:   08/27/23 1155  TempSrc: Oral  PainSc: 0-No pain      Patients Stated Pain Goal: 0 (08/27/23 1155)  Complications: No notable events documented.

## 2023-08-27 NOTE — Anesthesia Preprocedure Evaluation (Addendum)
Anesthesia Evaluation  Patient identified by MRN, date of birth, ID band Patient awake    Reviewed: Allergy & Precautions, H&P , NPO status , Patient's Chart, lab work & pertinent test results  Airway Mallampati: I  TM Distance: >3 FB Neck ROM: Full    Dental no notable dental hx. (+) Edentulous Upper, Edentulous Lower, Dental Advisory Given   Pulmonary COPD,  COPD inhaler, Current Smoker and Patient abstained from smoking.   Pulmonary exam normal breath sounds clear to auscultation       Cardiovascular hypertension, + Peripheral Vascular Disease   Rhythm:Regular Rate:Normal     Neuro/Psych negative neurological ROS  negative psych ROS   GI/Hepatic Neg liver ROS,GERD  Medicated,,  Endo/Other  negative endocrine ROS    Renal/GU negative Renal ROS  negative genitourinary   Musculoskeletal   Abdominal   Peds  Hematology negative hematology ROS (+)   Anesthesia Other Findings   Reproductive/Obstetrics negative OB ROS                             Anesthesia Physical Anesthesia Plan  ASA: 3  Anesthesia Plan: General   Post-op Pain Management: Tylenol PO (pre-op)*   Induction: Intravenous  PONV Risk Score and Plan: 2 and Ondansetron, Dexamethasone and Treatment may vary due to age or medical condition  Airway Management Planned: Oral ETT  Additional Equipment: Arterial line  Intra-op Plan:   Post-operative Plan: Extubation in OR  Informed Consent: I have reviewed the patients History and Physical, chart, labs and discussed the procedure including the risks, benefits and alternatives for the proposed anesthesia with the patient or authorized representative who has indicated his/her understanding and acceptance.   Patient has DNR.  Discussed DNR with patient and Suspend DNR.   Dental advisory given  Plan Discussed with: CRNA  Anesthesia Plan Comments:        Anesthesia  Quick Evaluation

## 2023-08-27 NOTE — Care Management Important Message (Signed)
Important Message  Patient Details  Name: Raymond Walker. MRN: [id number] Date of Birth: [birth year]   Medicare Important Message Given:  Yes     Sherilyn Banker 08/27/2023, 12:52 PM

## 2023-08-27 NOTE — Progress Notes (Addendum)
PROGRESS NOTE    Raymond Walker.  XBJ:[id number] DOB: 1951-05-12 DOA: 08/22/2023 PCP: Babs Sciara, MD   Brief Narrative: 72 year old with past medical history significant for peripheral artery disease, hypertension, COPD, Seizure presented to the ED complaining of increasing pain and redness at the site of his chronic left foot wound.  X-ray of the left foot showed osteomyelitis involving the medial aspect of the first metatarsal head and medial aspect of the base of the first proximal phalanx with overlying soft tissue ulceration.  He was found to have left ankle-brachial index moderate to severe arterial disease.  Vascular was consulted and patient underwent aortogram with pelvic angiography, right lower extremity angiogram, stenting of the left external iliac artery with 8 x 40 mm postdilated.  Right common and external iliac artery stents are patent, the common femoral artery of the right is patent with an occluded bypass which reconstitutes the popliteal artery with runoff the abdominal and via the posterior tibial artery.  There is 90% stenosis of the external iliac artery just above the takeoff of the hypogastric.  The common femoral artery itself has severe disease.  Plan is to proceed with left common femoral endarterectomy with femoral to above-knee popliteal artery bypass 9/10  He will then need follow-up by podiatry during this hospitalization.  Assessment & Plan:   Principal Problem:   Osteomyelitis of foot, left, acute (HCC) Active Problems:   COPD (chronic obstructive pulmonary disease) (HCC)   PAD (peripheral artery disease) (HCC)   Essential hypertension   Status post transmetatarsal amputation of foot, right (HCC)   1-Left Foot Osteomyelitis: -X-ray of the left foot showed osteomyelitis involving the medial aspect of the first metatarsal head and medial aspect of the base of the first proximal phalanx with overlying soft tissue ulceration. -Podiatry  consulted. -MRI  of the left foot: Acute osteomyelitis of the first metatarsal head and base of the great toe proximal phalanx.  Small first MTP joint effusion, likely septic arthritis. Bipartite tibial hallux sesamoid with marrow edema, highly suspicious for osteomyelitis. Mild marrow edema within the fibular hallux sesamoid, nonspecific. Soft tissue wound along the medial aspect of the forefoot adjacent to the first metatarsal head. No organized or drainable fluid collection. -ABIs: Right: Resting right ankle-brachial index indicates severe right lower  extremity arterial disease.  Left: Resting left ankle-brachial index indicates moderate/severe left  lower extremity arterial disease. The left toe-brachial index is abnormal.  -Continue with  IV vancomycin, ceftriaxone, Flagyl -ESR: 76---31. CRP: 10. -Tramadol PRN for pain.   PVD; Abnormal ABI. Vascular consulted -Underwent Arteriogram 9/09: underwent aortogram with pelvic angiography, right lower extremity angiogram, stenting of the left external iliac artery with 8 x 40 mm postdilated.  Right common and external iliac artery stents are patent, the common femoral artery of the right is patent with an occluded bypass which reconstitutes the popliteal artery with runoff the abdominal and via the posterior tibial artery.  There is 90% stenosis of the external iliac artery just above the takeoff of the hypogastric.  The common femoral artery itself has severe disease. -Plan to  proceed with left common femoral endarterectomy with femoral to above-knee popliteal artery bypass 9/10  Hypoglycemia: due to NPO status for procedure. Started on D 5 NS   Essential hypertension: -Not on medications at home.  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  Estimated body mass index is 22.75 kg/m as calculated from the following:   Height as of this  encounter: 5\' 11"  (1.803 m).   Weight as of this encounter: 74 kg.   DVT prophylaxis: Lovenox Code Status: DNR limited Family Communication: care discussed with patient. Niece updated 9/08 Disposition Plan:  Status is: Inpatient Remains inpatient appropriate because: management of left foot infection     Consultants:  Dr Germaine Pomfret  Procedures:  None  Antimicrobials:   Subjective He is alert, denies pain. He agree to have blood drawn for type and screen.  He wants to eat.   Objective: Vitals:   08/27/23 0015 08/27/23 0315 08/27/23 0400 08/27/23 0800  BP: 116/62 121/74  136/81  Pulse: 74   80  Resp: (!) 25 (!) 22  20  Temp:  97.7 F (36.5 C)  99.2 F (37.3 C)  TempSrc:  Oral  Oral  SpO2: 96%  96% 92%  Weight:      Height:        Intake/Output Summary (Last 24 hours) at 08/27/2023 1017 Last data filed at 08/27/2023 8295 Gross per 24 hour  Intake 2325.33 ml  Output 1000 ml  Net 1325.33 ml   Filed Weights   08/26/23 0700  Weight: 74 kg    Examination:  General exam: NAD Respiratory system: CTA Cardiovascular system: S 1, S 2 RRR Gastrointestinal system: BS present, soft, nt Central nervous system: Alert Extremities: left foot with redness 2 dry ulcer near big toe  Data Reviewed: I have personally reviewed following labs and imaging studies  CBC: Recent Labs  Lab 08/22/23 1736 08/23/23 1031 08/26/23 0405 08/27/23 0444  WBC 11.0* 10.2 7.6 6.6  NEUTROABS 8.1*  --   --   --   HGB 13.4 13.1 13.0 12.4*  HCT 40.9 39.1 40.1 38.4*  MCV 89.5 86.5 89.3 86.3  PLT 199 178 182 192   Basic Metabolic Panel: Recent Labs  Lab 08/22/23 1736 08/23/23 1031 08/26/23 0405 08/27/23 0444  NA 138 137 137 136  K 4.1 4.0 4.3 3.7  CL 98 101 104 105  CO2 28 24 23 24   GLUCOSE 115* 182* 67* 125*  BUN 17 16 19 17   CREATININE 1.17 1.15 1.03 1.02  CALCIUM 8.8* 8.5* 8.5* 8.6*   GFR: Estimated Creatinine Clearance: 67.5 mL/min (by C-G formula based on SCr of 1.02  mg/dL). Liver Function Tests: Recent Labs  Lab 08/22/23 1736  AST 15  ALT 21  ALKPHOS 68  BILITOT 0.6  PROT 7.5  ALBUMIN 3.3*   No results for input(s): "LIPASE", "AMYLASE" in the last 168 hours. No results for input(s): "AMMONIA" in the last 168 hours. Coagulation Profile: No results for input(s): "INR", "PROTIME" in the last 168 hours. Cardiac Enzymes: No results for input(s): "CKTOTAL", "CKMB", "CKMBINDEX", "TROPONINI" in the last 168 hours. BNP (last 3 results) No results for input(s): "PROBNP" in the last 8760 hours. HbA1C: No results for input(s): "HGBA1C" in the last 72 hours.  CBG: Recent Labs  Lab 08/26/23 1452  GLUCAP 96   Lipid Profile: Recent Labs    08/27/23 0444  CHOL 58  HDL 18*  LDLCALC 29  TRIG 56  CHOLHDL 3.2   Thyroid Function Tests: No results for input(s): "TSH", "T4TOTAL", "FREET4", "T3FREE", "THYROIDAB" in the last 72 hours. Anemia Panel: No results for input(s): "VITAMINB12", "FOLATE", "FERRITIN", "TIBC", "IRON", "RETICCTPCT" in the last 72 hours.  Sepsis Labs: Recent Labs  Lab 08/22/23 1736  LATICACIDVEN 1.8    Recent  Results (from the past 240 hour(s))  Blood Cultures x 2 sites     Status: None (Preliminary result)   Collection Time: 08/22/23  5:36 PM   Specimen: BLOOD LEFT ARM  Result Value Ref Range Status   Specimen Description BLOOD LEFT ARM  Final   Special Requests   Final    BOTTLES DRAWN AEROBIC AND ANAEROBIC Blood Culture adequate volume   Culture   Final    NO GROWTH 4 DAYS Performed at [facility], [address] [place], [place] [address]    Report Status PENDING  Incomplete  Blood Cultures x 2 sites     Status: None (Preliminary result)   Collection Time: 08/22/23  6:27 PM   Specimen: BLOOD  Result Value Ref Range Status   Specimen Description BLOOD RIGHT ANTECUBITAL  Final   Special Requests   Final    BOTTLES DRAWN AEROBIC AND ANAEROBIC Blood Culture adequate volume   Culture   Final    NO GROWTH 4  DAYS Performed at [facility], [address], [place], [place] [address]    Report Status PENDING  Incomplete         Radiology Studies: PERIPHERAL VASCULAR CATHETERIZATION  Result Date: 08/26/2023 Images from the original result were not included. Patient name: Kawliga Crumity. 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 Randie Heinz, MD Procedure Performed: 1.  Ultrasound-guided cannulation right common femoral artery 2.  Catheter aorta and aortogram with pelvic angiography 3.  Selection of the left common femoral artery and left lower extremity angiogram 4.  Right lower extremity angiogram 5.  Stent of left external iliac artery with 8 x 40 mm [place] postdilated with 7 mm balloon 6.  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  He now has left lower extremity ulceration with severely depressed ABIs and a toe pressure 29 mmHg.  He is indicated for angiography with possible intervention. Findings: The aorta is free of flow-limiting stenosis.  There are right common and external iliac artery stents which are patent and the common femoral artery on the right is patent with an occluded bypass which reconstitutes the popliteal artery with runoff dominant via the posterior tibial artery.  The left side is the site of interest.  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  The SFA has a store takeoff and then it is occluded for a  long segment reconstituting above the knee popliteal artery with dominant runoff via a large posterior tibial artery. Plan will be for left common femoral endarterectomy with femoral to above-knee popliteal artery bypass.  Procedure:  The patient was identified in the holding area and taken to room 8.  The patient was then placed supine on the table and prepped and draped in the usual sterile fashion.  A time out was called.  Ultrasound was used to evaluate the right common femoral artery.  This was patulous and patent.  The area was anesthetized 1% lidocaine and cannulated with a  micropuncture needle followed by wire and a sheath.  An ultrasound image was saved to the permanent record.  Concomitantly we administered fentanyl and Versed and his vital signs were monitored throughout the case by bedside nursing.  We placed a Bentson wire followed by a 5 French sheath around the catheter at the level of L1 and performed aortogram and then pulled back the catheter and perform pelvic angiography.  I then crossed the bifurcation perform left lower extremity angiography.  With the above findings I elected to intervene on the left external iliac artery stenosis given that there was a large gradient across this and no palpable left common femoral pulse.  I first placed a Rosen wire and then a long 6 French sheath and 5000 units of heparin was administered.  I then primarily stented with an 8 x 40 mm drug-eluting stent postdilated with 7 mm balloon.  There was now no gradient across the stent and a bounding common femoral pulse on the left although with palpable calcification in the artery itself.  We then exchanged for a short 6 French sheath on the right and performed right lower extremity angiography.  The sheath will be pulled in postoperative holding given history of endarterectomy of the right.  He tolerated the procedure without any complication. Contrast: 100cc Brandon C. Randie Heinz, MD [facility]  [place] Office: [phone] Pager: [phone]        Scheduled Meds:  aspirin EC  81 mg Oral Daily   azelastine  2 spray Each Nare BID   clopidogrel  300 mg Oral Once   Followed by   clopidogrel  75 mg Oral Q breakfast   cyanocobalamin  1,000 mcg Intramuscular Daily   [START ON 08/29/2023] vitamin B-12  1,000 mcg Oral Daily   enoxaparin (LOVENOX) injection  40 mg Subcutaneous Q24H   mometasone-formoterol  2 puff Inhalation BID   pantoprazole  40 mg Oral Daily   rosuvastatin  5 mg Oral Daily   sodium chloride flush  3 mL Intravenous Q12H   trimethoprim-polymyxin b  1 drop Both Eyes Q6H   Continuous Infusions:  sodium chloride     cefTRIAXone (ROCEPHIN)  IV Stopped (08/26/23 1308)   dextrose 5% lactated ringers 75 mL/hr at 08/27/23 0146   metronidazole 500 mg (08/26/23 2127)   vancomycin 1,250 mg (08/27/23 0908)     LOS: 5 days    Time spent: 35 minutes    Nelly Scriven A Gavrielle Streck, MD [facility]   If 7PM-7AM, please contact night-coverage [email or web address]  08/27/2023, 10:17 AM

## 2023-08-27 NOTE — Anesthesia Procedure Notes (Signed)
Arterial Line Insertion Start/End9/09/2023 1:38 PM, 08/27/2023 1:38 PM Performed by: CRNA  Patient location: Pre-op. Preanesthetic checklist: patient identified, IV checked, site marked, risks and benefits discussed, surgical consent, monitors and equipment checked, pre-op evaluation, timeout performed and anesthesia consent Lidocaine 1% used for infiltration Left, radial was placed Catheter size: 20 G Hand hygiene performed  and maximum sterile barriers used   Attempts: 1 Procedure performed without using ultrasound guided technique. Following insertion, dressing applied and Biopatch. Post procedure assessment: normal  Patient tolerated the procedure well with no immediate complications.

## 2023-08-27 NOTE — Progress Notes (Addendum)
  Progress Note    08/27/2023 7:21 AM 1 Day Post-Op  Subjective:  no complaints  afebrile  Vitals:   08/27/23 0315 08/27/23 0400  BP: 121/74   Pulse:    Resp: (!) 22   Temp: 97.7 F (36.5 C)   SpO2:  96%    Physical Exam: General:  resting, wakes easily Lungs:  non labored Incisions:  right groin is soft without hematoma Extremities:  difficult appreciating left femoral pulse   CBC    Component Value Date/Time   WBC 6.6 08/27/2023 0444   RBC 4.45 08/27/2023 0444   HGB 12.4 (L) 08/27/2023 0444   HGB 15.7 12/12/2022 1137   HCT 38.4 (L) 08/27/2023 0444   HCT 46.6 12/12/2022 1137   PLT 192 08/27/2023 0444   PLT 139 (L) 12/12/2022 1137   MCV 86.3 08/27/2023 0444   MCV 87 12/12/2022 1137   MCH 27.9 08/27/2023 0444   MCHC 32.3 08/27/2023 0444   RDW 14.2 08/27/2023 0444   RDW 13.6 12/12/2022 1137   LYMPHSABS 1.5 08/22/2023 1736   LYMPHSABS 1.6 12/12/2022 1137   MONOABS 1.3 (H) 08/22/2023 1736   EOSABS 0.1 08/22/2023 1736   EOSABS 0.1 12/12/2022 1137   BASOSABS 0.1 08/22/2023 1736   BASOSABS 0.1 12/12/2022 1137    BMET    Component Value Date/Time   NA 136 08/27/2023 0444   NA 144 12/12/2022 1137   K 3.7 08/27/2023 0444   CL 105 08/27/2023 0444   CO2 24 08/27/2023 0444   GLUCOSE 125 (H) 08/27/2023 0444   BUN 17 08/27/2023 0444   BUN 19 12/12/2022 1137   CREATININE 1.02 08/27/2023 0444   CREATININE 0.70 08/01/2018 1126   CALCIUM 8.6 (L) 08/27/2023 0444   GFRNONAA >60 08/27/2023 0444   GFRNONAA 97 08/01/2018 1126   GFRAA >60 08/24/2020 1221   GFRAA 112 08/01/2018 1126    INR    Component Value Date/Time   INR 1.3 (H) 05/21/2020 1829     Intake/Output Summary (Last 24 hours) at 08/27/2023 0721 Last data filed at 08/27/2023 1696 Gross per 24 hour  Intake 2325.33 ml  Output 1200 ml  Net 1125.33 ml      Assessment/Plan:  72 y.o. male is s/p:  Angiogram via right CFA with stent of left EIA  1 Day Post-Op   -right groin soft without  hematoma; difficulty appreciating left femoral pulse, which is severely diseased.  -plan for left femoral endarterectomy and femoral to popliteal bypass grafting today with Dr. Randie Heinz -npo -continue asa/plavix/crestor   Doreatha Massed, PA-C [facility] [phone] 08/27/2023 7:21 AM  I have independently interviewed and examined patient and agree with PA assessment and plan above. Improved left femoral pulse but diminished relative to right. Plan for left cfea and bypass to above or below knee popliteal artery.   Debby Clyne C. Randie Heinz, MD [facility] [place] Office: [phone] Pager: [phone]

## 2023-08-27 NOTE — TOC Initial Note (Signed)
Transition of Care (TOC) - Initial/Assessment Note    Patient Details  Name: Raymond Walker. MRN: [id number] Date of Birth: May 20, 1951  Transition of Care [facility]) CM/SW Contact:    Delilah Shan, LCSWA Phone Number: 08/27/2023, 10:12 AM  Clinical Narrative:                   CSW received consult for patient. CSW spoke with patient at bedside. Patient reports PTA he comes from [facility] term care [facility]F. Patient reports plan is to return back to ALF when medically ready for dc. Patient reports he will need transportation at dc. CSW offered patient [employer]. Patient accepted. SDOH screening complete. CSW spoke with Claris Gladden at [facility] term care ALF. Claris Gladden request PT/OT eval for patient. If facility can accept patient back they will need DC summary and FL2 faxed to tele# [phone]. Facility request for CSW to notify them close to patient being medically ready in case they need to send someone from facility to assess patient to confirm if he can return. CSW request PT/OT eval. CSW will continue to follow and assist with patients dc planning needs.  Expected Discharge Plan: Assisted Living Barriers to Discharge: Continued Medical Work up   Patient Goals and CMS Choice Patient states their goals for this hospitalization and ongoing recovery are:: to return to ALF   Choice offered to / list presented to : Patient      Expected Discharge Plan and Services In-house Referral: Clinical Social Work     Living arrangements for the past 2 months: Assisted Living Facility                                      Prior Living Arrangements/Services Living arrangements for the past 2 months: Assisted Living Facility Lives with:: Facility Resident Patient language and need for interpreter reviewed:: Yes Do you feel safe going back to the place where you live?: Yes      Need for Family Participation in Patient Care: Yes (Comment) Care giver support system in  place?: Yes (comment)   Criminal Activity/Legal Involvement Pertinent to Current Situation/Hospitalization: No - Comment as needed  Activities of Daily Living Home Assistive Devices/Equipment: Wheelchair, Environmental consultant (specify type) ADL Screening (condition at time of admission) Patient's cognitive ability adequate to safely complete daily activities?: Yes Is the patient deaf or have difficulty hearing?: No Does the patient have difficulty seeing, even when wearing glasses/contacts?: No Does the patient have difficulty concentrating, remembering, or making decisions?: No Patient able to express need for assistance with ADLs?: Yes Does the patient have difficulty dressing or bathing?: Yes Independently performs ADLs?: Yes (appropriate for developmental age) Does the patient have difficulty walking or climbing stairs?: Yes Weakness of Legs: Left Weakness of Arms/Hands: None  Permission Sought/Granted Permission sought to share information with : Case Manager, Family Supports, Oceanographer granted to share information with : Yes, Verbal Permission Granted     Permission granted to share info w AGENCY: ALF        Emotional Assessment Appearance:: Appears stated age Attitude/Demeanor/Rapport: Gracious Affect (typically observed): Calm Orientation: : Oriented to Self, Oriented to Place, Oriented to  Time, Oriented to Situation Alcohol / Substance Use: Not Applicable Psych Involvement: No (comment)  Admission diagnosis:  Osteomyelitis of foot, left, acute (HCC) [M86.172] Osteomyelitis of great toe of left foot (HCC) [M86.9] Patient Active Problem  List   Diagnosis Date Noted   Osteomyelitis of foot, left, acute (HCC) 08/22/2023   Status post transmetatarsal amputation of foot, 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   Right ureteral stone 01/01/2022   Abdominal pain 01/01/2022   Sepsis secondary to UTI (HCC) 12/06/2021   Leukocytosis 12/06/2021   Hypoalbuminemia due to  protein-calorie malnutrition (HCC) 12/06/2021   Lactic acidosis 12/06/2021   AKI (acute kidney injury) (HCC) 12/06/2021   Essential hypertension 12/06/2021   Mixed hyperlipidemia 12/06/2021   GERD (gastroesophageal reflux disease) 12/06/2021   Nephrolithiasis 11/21/2021   Low back pain 09/19/2021   Cognitive dysfunction 06/23/2020   History of seizures 05/31/2020   Erectile dysfunction 05/02/2020   Caregiver with fatigue 05/02/2020   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HCC)    Goals of care, counseling/discussion    Palliative care by specialist    DNR (do not resuscitate)    Vitamin D deficiency 01/24/2020   Sepsis (HCC) 01/23/2020   Altered mental status    Hypokalemia    History of kidney stones    Hematuria    UTI (urinary tract infection)    CHRONIC Bladder outlet obstruction    PAD (peripheral artery disease) (HCC) 09/15/2019   Protein-calorie malnutrition, severe 08/21/2019   Cellulitis of right lower extremity    Impaired glucose tolerance    COPD (chronic obstructive pulmonary disease) (HCC)    Thrombocytopenia (HCC) 08/10/2018   Aortic atherosclerosis (HCC) 10/14/2016   Coronary artery calcification seen on CAT scan 10/14/2016   Other emphysema (HCC) 10/14/2016   Elevated blood pressure 12/03/2011   Chest pain 12/03/2011   Tobacco abuse 12/03/2011   Right leg pain 12/03/2011   PCP:  Babs Sciara, MD Pharmacy:   [facility] [place] - [address] [place] [address] Phone: [phone] Fax: [phone]     Social Determinants of Health (SDOH) Social History: SDOH Screenings   Food Insecurity: Food Insecurity Present (08/27/2023)  Housing: Low Risk  (08/22/2023)  Transportation Needs: No Transportation Needs (08/22/2023)  Utilities: Not At Risk (08/22/2023)  Alcohol Screen: Low Risk  (09/19/2021)  Depression (PHQ2-9): Low Risk  (07/16/2023)  Financial Resource Strain: High Risk (03/13/2023)  Physical Activity: Unknown (03/13/2023)   Social Connections: Socially Isolated (03/13/2023)  Stress: No Stress Concern Present (03/13/2023)  Tobacco Use: High Risk (08/22/2023)   SDOH Interventions: Food Insecurity Interventions: Other (Comment) (CSW offered patient food resources. Patient accepted)   Readmission Risk Interventions    12/12/2021    2:38 PM  Readmission Risk Prevention Plan  Transportation Screening Complete  PCP or Specialist Appt within 5-7 Days Complete  Home Care Screening Complete  Medication Review (RN CM) Complete

## 2023-08-27 NOTE — Op Note (Signed)
Patient name: Raymond Walker. 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 Randie Heinz, MD Assistants: Clinton Gallant, PA Procedure Performed: 1.  Left common femoral endarterectomy 2.  Left common femoral to above-knee popliteal artery bypass with 6 mm ringed PTFE  Indications: 72 year old male with a history of right femoropopliteal bypass with inflow stenting and subsequent transmetatarsal amputation that has healed.  He now presents with left-sided first metatarsal head osteomyelitis with multilevel disease.  He underwent stenting of the external iliac artery on the left yesterday and is now indicated for left common femoral endarterectomy with above-knee popliteal artery bypass.  Experience assistant was necessary to to facilitate exposure of the left common femoral artery as well as the external iliac artery, the profunda femoris and the superficial femoral arteries.  She was also necessary to expose the popliteal artery and perform bypass including assisting with anastomosis.  Findings: Common femoral artery itself was heavily calcified there was severe inflow stenosis and endarterectomy was performed high under the inguinal ligament into the external iliac artery and into the profunda and SFA until there was brisk backbleeding from the profunda although only trickle backbleeding from the SFA and very strong inflow.  The popliteal artery itself was calcified and limited endarterectomy was performed.  At completion there was a palpable popliteal pulse distal to the bypass as well as posterior tibial pulse at the ankle these were confirmed with Doppler.   Procedure:  The patient was identified in the holding area and taken to the operating room where is placed by operative when general anesthesia was induced.  He  was gently prepped and draped in the left lower extremity usual fashion, antibiotics were administered a timeout was called.  A vertical incision was created in the groin over the weakly palpable femoral pulse.  We dissected down to the common femoral artery up to the inguinal ligament and divided the crossing vein and encircled the external iliac artery.  There was a soft area amenable for clamping above the inguinal ligament.  We also dissected out the profunda and the SFA and encircled these with Vesseloops as well as a medial circumflex loop and multiple epigastric vessels.  We then created an incision above the knee in standard popliteal exposure and dissected down and identified the popliteal vein and retracted that medially and identified the artery and encircled this proximally and distally.  There was 1 area amenable for bypass and there was a distal area for clamping proximally it was heavily calcified but it did seem that a clamp would achieve hemostasis there as well.  We checked with Doppler and there was brisk monophasic flow within the graft.  We then tunneled 6 mm ringed PTFE between the 2 incisions in an anatomic plane the patient was fully heparinized.  In the groin we then clamped the outflow followed by the inflow.  A vertical arteriotomy was created approximately 15 mm.  Extensive endarterectomy including up under the inguinal ligament was performed until there was very strong inflow and down into the SFA and profunda until there was trickle backbleeding from the SFA and pulsatile profunda backbleeding.  The PTFE graft was then spatulated and sewn end to side with 6-0 Prolene suture.  We then flushed through the graft and clamped the graft itself and flushed the graft with heparinized saline.  Below the knee we then  clamped the outflow followed by the inflow of the popliteal artery and opened this longitudinally.  Limited endarterectomy was performed to enable sewing of the graft.  The leg was  then straight and the graft was trimmed to size and sewn into side with 6-0 Prolene suture.  Prior to completion without flushing all directions and upon completion there was a very strong palpable popliteal pulse distal to our bypass and a palpable posterior tibial pulse at the ankle.  These were confirmed with Doppler.  50 mg of protamine was administered.  We meticulously obtain hemostasis in both wounds and thoroughly irrigated them and closed in layers with Vicryl and Monocryl.  Dermabond was placed at the skin level.  The patient was then awakened from anesthesia having tolerated the procedure without any complication.  All counts were correct at completion.  EBL: 300cc   Quint Chestnut C. Randie Heinz, MD [facility] Office: [phone] Pager: [phone]

## 2023-08-27 NOTE — Anesthesia Procedure Notes (Signed)
Procedure Name: Intubation Date/Time: 08/27/2023 2:06 PM  Performed by: Sandie Ano, CRNAPre-anesthesia Checklist: Patient identified, Emergency Drugs available, Suction available and Patient being monitored Patient Re-evaluated:Patient Re-evaluated prior to induction Oxygen Delivery Method: Circle System Utilized Preoxygenation: Pre-oxygenation with 100% oxygen Induction Type: IV induction Ventilation: Mask ventilation without difficulty Laryngoscope Size: Mac and 4 Grade View: Grade I Tube type: Oral Tube size: 7.0 mm Number of attempts: 1 Airway Equipment and Method: Stylet and Oral airway Placement Confirmation: ETT inserted through vocal cords under direct vision, positive ETCO2 and breath sounds checked- equal and bilateral Secured at: 22 cm Tube secured with: Tape Dental Injury: Teeth and Oropharynx as per pre-operative assessment

## 2023-08-27 NOTE — Anesthesia Postprocedure Evaluation (Signed)
Anesthesia Post Note  Patient: Isabelle Hedding.  Procedure(s) Performed: BYPASS GRAFT LEFT COMMON FEMORAL-POPLITEAL ARTERY (Left: Leg Upper) RIGHT FEMORAL ENDARTERECTOMY (Left: Groin)     Patient location during evaluation: PACU Anesthesia Type: General Level of consciousness: awake and alert Pain management: pain level controlled Vital Signs Assessment: post-procedure vital signs reviewed and stable Respiratory status: spontaneous breathing, nonlabored ventilation and respiratory function stable Cardiovascular status: blood pressure returned to baseline and stable Postop Assessment: no apparent nausea or vomiting Anesthetic complications: no  No notable events documented.  Last Vitals:  Vitals:   08/27/23 1155 08/27/23 1645  BP: 134/82 103/87  Pulse: 75 77  Resp: 17 (!) 25  Temp: 37.1 C 37.4 C  SpO2: 97% 94%    Last Pain:  Vitals:   08/27/23 1645  TempSrc:   PainSc: 0-No pain                 Judithe Keetch,W. EDMOND

## 2023-08-28 ENCOUNTER — Encounter (HOSPITAL_COMMUNITY): Payer: Self-pay | Admitting: Vascular Surgery

## 2023-08-28 DIAGNOSIS — M86172 Other acute osteomyelitis, left ankle and foot: Secondary | ICD-10-CM | POA: Diagnosis not present

## 2023-08-28 DIAGNOSIS — I739 Peripheral vascular disease, unspecified: Secondary | ICD-10-CM

## 2023-08-28 LAB — CULTURE, BLOOD (ROUTINE X 2)
Culture: NO GROWTH
Culture: NO GROWTH
Special Requests: ADEQUATE
Special Requests: ADEQUATE

## 2023-08-28 LAB — BASIC METABOLIC PANEL
Anion gap: 5 (ref 5–15)
BUN: 20 mg/dL (ref 8–23)
CO2: 22 mmol/L (ref 22–32)
Calcium: 7.9 mg/dL — ABNORMAL LOW (ref 8.9–10.3)
Chloride: 111 mmol/L (ref 98–111)
Creatinine, Ser: 0.98 mg/dL (ref 0.61–1.24)
GFR, Estimated: >60 mL/min (ref >60)
Glucose, Bld: 170 mg/dL — ABNORMAL HIGH (ref 70–99)
Potassium: 3.9 mmol/L (ref 3.5–5.1)
Sodium: 138 mmol/L (ref 135–145)

## 2023-08-28 LAB — CBC
HCT: 28.4 % — ABNORMAL LOW (ref 39.0–52.0)
Hemoglobin: 9.4 g/dL — ABNORMAL LOW (ref 13.0–17.0)
MCH: 29.3 pg (ref 26.0–34.0)
MCHC: 33.1 g/dL (ref 30.0–36.0)
MCV: 88.5 fL (ref 80.0–100.0)
Platelets: 186 10*3/uL (ref 150–400)
RBC: 3.21 MIL/uL — ABNORMAL LOW (ref 4.22–5.81)
RDW: 14.1 % (ref 11.5–15.5)
WBC: 10.1 10*3/uL (ref 4.0–10.5)
nRBC: 0 % (ref 0.0–0.2)

## 2023-08-28 MED ORDER — AMOXICILLIN-POT CLAVULANATE 875-125 MG PO TABS
1.0000 | ORAL_TABLET | Freq: Two times a day (BID) | ORAL | Status: DC
  Administered 2023-08-28 – 2023-08-30 (×5): 1 via ORAL
  Filled 2023-08-28 (×6): qty 1

## 2023-08-28 MED ORDER — METOPROLOL TARTRATE 5 MG/5ML IV SOLN: 5.0000 mg | INTRAVENOUS | Status: DC | PRN

## 2023-08-28 MED ORDER — HYDRALAZINE HCL 20 MG/ML IJ SOLN
10.0000 mg | INTRAMUSCULAR | Status: DC | PRN
  Administered 2023-08-28: 10 mg via INTRAVENOUS
  Filled 2023-08-28: qty 1

## 2023-08-28 MED ORDER — ROSUVASTATIN CALCIUM 20 MG PO TABS
20.0000 mg | ORAL_TABLET | Freq: Every day | ORAL | Status: DC
  Administered 2023-08-29 – 2023-08-30 (×2): 20 mg via ORAL
  Filled 2023-08-28 (×2): qty 1

## 2023-08-28 MED ORDER — SENNOSIDES-DOCUSATE SODIUM 8.6-50 MG PO TABS: 1.0000 | ORAL_TABLET | Freq: Every evening | ORAL | Status: DC | PRN

## 2023-08-28 MED ORDER — IPRATROPIUM-ALBUTEROL 0.5-2.5 (3) MG/3ML IN SOLN
3.0000 mL | RESPIRATORY_TRACT | Status: DC | PRN
  Filled 2023-08-28: qty 3

## 2023-08-28 MED ORDER — TRAZODONE HCL 50 MG PO TABS
50.0000 mg | ORAL_TABLET | Freq: Every evening | ORAL | Status: DC | PRN
  Administered 2023-08-28 – 2023-08-29 (×2): 50 mg via ORAL
  Filled 2023-08-28 (×2): qty 1

## 2023-08-28 MED ORDER — GUAIFENESIN 100 MG/5ML PO LIQD: 5.0000 mL | ORAL | Status: DC | PRN

## 2023-08-28 MED ORDER — GLUCAGON HCL RDNA (DIAGNOSTIC) 1 MG IJ SOLR: 1.0000 mg | INTRAMUSCULAR | Status: DC | PRN

## 2023-08-28 MED ORDER — DOXYCYCLINE HYCLATE 100 MG PO TABS
100.0000 mg | ORAL_TABLET | Freq: Two times a day (BID) | ORAL | Status: DC
  Administered 2023-08-28 – 2023-08-30 (×5): 100 mg via ORAL
  Filled 2023-08-28 (×5): qty 1

## 2023-08-28 NOTE — Hospital Course (Addendum)
  Brief Narrative: 72 year old with past medical history significant for peripheral artery disease, hypertension, COPD, Seizure presented to the ED complaining of increasing pain and redness at the site of his chronic left foot wound.  X-ray of the left foot showed osteomyelitis involving the medial aspect of the first metatarsal head and medial aspect of the base of the first proximal phalanx with overlying soft tissue ulceration.  He was found to have abnormal left ABI requiring vascular consult.  Patient underwent angiogram with stent placement and ultimately needing left common femoral endarterectomy and femoropopliteal bypass on 9/10     Assessment & Plan:   Principal Problem:   Osteomyelitis of foot, left, acute (HCC) Active Problems:   COPD (chronic obstructive pulmonary disease) (HCC)   PAD (peripheral artery disease) (HCC)   Essential hypertension   Status post transmetatarsal amputation of foot, right (HCC)     Left Foot Osteomyelitis: This is secondary to severe peripheral vascular disease/left lower limb with life threatening ischemia.  Currently on vancomycin, Rocephin and Flagyl Seen by podiatry, patient not interested in amputation at this time as he is feeling better status post vascular intervention.  Will need outpatient follow-up.  ID recommended 6 weeks of doxycycline and Augmentin.   Severe peripheral vascular disease with life-threatening left lower extremity limb ischemia 9/9 underwent angiogram followed by left common femoral endarterectomy and femoropopliteal bypass on 9/10.  Postop management per vascular team.  Aspirin, Plavix, statin.  PT/OT.   Hypoglycemia:  Secondary to n.p.o. status now improving     Essential hypertension: IV as needed   COPD: Bronchodilators   Hyperlipidemia:  -Statin     B 12 Deficiency:  -Supplements       Estimated body mass index is 22.75 kg/m as calculated from the following:   Height as of this encounter: 5\' 11"  (1.803  m).   Weight as of this encounter: 74 kg.     DVT prophylaxis: Lovenox Code Status: DNR limited Family Communication:  Disposition Plan:  Status is: Inpatient Remains inpatient appropriate because: management of left foot infection. Hopefully dc tomorrow on PO Abx

## 2023-08-28 NOTE — Progress Notes (Signed)
Subjective:  Patient ID: Raymond Butt., male    DOB: Jan 10, 1951,  MRN: [id number]  Patient with left foot wounds and osteomyelitis s/p left common femoral-popliteal bypass and right femoral endarterectomy. Relates his foot is feeling good with no pain. States he is done with having surgeries and ready to go home.   Past Medical History:  Diagnosis Date   Caregiver with fatigue 05/02/2020   COPD (chronic obstructive pulmonary disease) (HCC)    Deformity    equinus and metatarsal   Erectile dysfunction    Erectile dysfunction 05/02/2020   Full dentures    History of kidney stones    History of seizures 05/31/2020   Between ages 18 and 55   Hypertension    Impaired glucose tolerance    Pancreatitis, recurrent    Pneumonia    Thrombocytopenia (HCC) 08/10/2018   Staying in the low 100s will follow closely     Past Surgical History:  Procedure Laterality Date   ABDOMINAL AORTOGRAM W/LOWER EXTREMITY Bilateral 08/20/2019   Procedure: ABDOMINAL AORTOGRAM W/LOWER EXTREMITY;  Surgeon: Cephus Shelling, MD;  Location: [facility] INVASIVE CV LAB;  Service: Cardiovascular;  Laterality: Bilateral;   ABDOMINAL AORTOGRAM W/LOWER EXTREMITY N/A 08/26/2023   Procedure: ABDOMINAL AORTOGRAM W/LOWER EXTREMITY;  Surgeon: Maeola Harman, MD;  Location: [facility] INVASIVE CV LAB;  Service: Cardiovascular;  Laterality: N/A;   ACHILLES TENDON SURGERY Right 08/26/2020   Procedure: ACHILLES TENDON LENGTHENING;  Surgeon: Edwin Cap, DPM;  Location: [facility] OR;  Service: Podiatry;  Laterality: Right;   AMPUTATION Right 09/15/2019   Procedure: AMPUTATION RIGHT TOES One, Two, And Three;  Surgeon: Maeola Harman, MD;  Location: [facility] OR;  Service: Vascular;  Laterality: Right;   APPENDECTOMY     CATARACT EXTRACTION W/PHACO Right 05/02/2018   Procedure: CATARACT EXTRACTION PHACO AND INTRAOCULAR LENS PLACEMENT (IOC);  Surgeon: Fabio Pierce, MD;  Location: [facility] ORS;  Service: Ophthalmology;  Laterality: Right;   CDE: 11.11   CATARACT EXTRACTION W/PHACO Left 07/04/2018   Procedure: CATARACT EXTRACTION PHACO AND INTRAOCULAR LENS PLACEMENT (IOC);  Surgeon: Fabio Pierce, MD;  Location: [facility] ORS;  Service: Ophthalmology;  Laterality: Left;  CDE: 7.15   CHOLECYSTECTOMY     CYSTOSCOPY W/ URETERAL STENT PLACEMENT Right 11/22/2021   Procedure: CYSTOSCOPY WITH RETROGRADE PYELOGRAM/URETERAL STENT PLACEMENT;  Surgeon: Marcine Matar, MD;  Location: [facility] ORS;  Service: Urology;  Laterality: Right;   CYSTOSCOPY WITH RETROGRADE PYELOGRAM, URETEROSCOPY AND STENT PLACEMENT Right 02/01/2022   Procedure: CYSTOSCOPY WITH RETROGRADE PYELOGRAM, URETEROSCOPY AND STENT PLACEMENT;  Surgeon: Malen Gauze, MD;  Location: [facility] ORS;  Service: Urology;  Laterality: Right;   ENDARTERECTOMY FEMORAL Left 08/27/2023   Procedure: RIGHT FEMORAL ENDARTERECTOMY;  Surgeon: Maeola Harman, MD;  Location: [facility] OR;  Service: Vascular;  Laterality: Left;   FEMORAL-POPLITEAL BYPASS GRAFT Right 09/15/2019   Procedure: Right BYPASS GRAFT FEMORAL to Above Knee POPLITEAL ARTERY;  Surgeon: Maeola Harman, MD;  Location: [facility] OR;  Service: Vascular;  Laterality: Right;   FEMORAL-POPLITEAL BYPASS GRAFT Right 01/26/2020   Procedure: IRRIGATION AND DEBRIDEMENT RIGHT FEMORAL POPLITEAL BYPASS SITE;  Surgeon: Maeola Harman, MD;  Location: [facility] OR;  Service: Vascular;  Laterality: Right;   FEMORAL-POPLITEAL BYPASS GRAFT Left 08/27/2023   Procedure: BYPASS GRAFT LEFT COMMON FEMORAL-POPLITEAL ARTERY;  Surgeon: Maeola Harman, MD;  Location: [facility] OR;  Service: Vascular;  Laterality: Left;   HOLMIUM LASER APPLICATION Right 02/01/2022   Procedure: HOLMIUM LASER APPLICATION;  Surgeon: Malen Gauze, MD;  Location: [facility] ORS;  Service: Urology;  Laterality: Right;   MULTIPLE TOOTH EXTRACTIONS     ORCHIECTOMY     PERIPHERAL VASCULAR INTERVENTION Left 08/20/2019   Procedure: PERIPHERAL VASCULAR INTERVENTION;  Surgeon: Cephus Shelling, MD;  Location: [facility] INVASIVE CV LAB;  Service: Cardiovascular;  Laterality: Left;  common/external iliac   PERIPHERAL VASCULAR INTERVENTION Right 08/26/2023   Procedure: PERIPHERAL VASCULAR INTERVENTION;  Surgeon: Maeola Harman, MD;  Location: [facility] INVASIVE CV LAB;  Service: Cardiovascular;  Laterality: Right;  External Illiac   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   Procedure: TRANSMETATARSAL AMPUTATION;  Surgeon: Maeola Harman, MD;  Location: [facility] OR;  Service: Vascular;  Laterality: Right;   TRANSMETATARSAL AMPUTATION Right 08/26/2020   Procedure: TRANSMETATARSAL AMPUTATION REVISION;  Surgeon: Edwin Cap, DPM;  Location: [facility] OR;  Service: Podiatry;  Laterality: Right;   VASECTOMY     WOUND DEBRIDEMENT Right 08/26/2020   Procedure: EXCISION WOUND;  Surgeon: Edwin Cap, DPM;  Location: [facility] OR;  Service: Podiatry;  Laterality: Right;       Latest Ref Rng & Units 08/28/2023    5:00 AM 08/27/2023    4:44 AM 08/26/2023    4:05 AM  CBC  WBC 4.0 - 10.5 K/uL 10.1  6.6  7.6   Hemoglobin 13.0 - 17.0 g/dL 9.4  81.1  91.4   Hematocrit 39.0 - 52.0 % 28.4  38.4  40.1   Platelets 150 - 400 K/uL 186  192  182        Latest Ref Rng & Units 08/28/2023    5:00 AM 08/27/2023    4:44 AM 08/26/2023    4:05 AM  BMP  Glucose 70 - 99 mg/dL 782  956  67   BUN 8 - 23 mg/dL 20  17  19    Creatinine 0.61 - 1.24 mg/dL 2.13  0.86  5.78   Sodium 135 - 145 mmol/L 138  136  137   Potassium 3.5 - 5.1 mmol/L 3.9  3.7  4.3   Chloride 98 - 111 mmol/L 111  105  104   CO2 22 - 32 mmol/L 22  24  23    Calcium 8.9 - 10.3 mg/dL 7.9  8.6  8.5      Objective:   Vitals:   08/27/23 2315 08/28/23 0317  BP: (!) 97/54 (!) 101/54  Pulse: 84 74  Resp: 17 20  Temp: 97.7 F (36.5 C) 97.8 F (36.6 C)  SpO2:      General:AA&O x 3. Normal mood and affect   Vascular: DP and PT pulses 2/4 bilateral. Brisk capillary refill to all digits. Pedal hair present   Neruological. Epicritic  sensation grossly intact.   Derm: Medial right first MPJ wound with overlying hyperkeratosis improved erythema and edema surrounding.  No purulence. No probe to bone. Interspaces clears of maceration.    MSK: MMT 5/5 in dorsiflexion, plantar flexion, inversion and eversion. Normal joint ROM without pain or crepitus.       Bilateral ABIs appear decreased.    Summary:  Right: Resting right ankle-brachial index indicates severe right lower  extremity arterial disease.   Left: Resting left ankle-brachial index indicates moderate/severe left  lower extremity arterial disease. The left toe-brachial index is abnormal.   IMPRESSION: 1. Acute osteomyelitis of the first metatarsal head and base of the great toe proximal phalanx. 2. Small first MTP joint effusion, likely septic arthritis. 3. Bipartite tibial hallux sesamoid with marrow edema, highly suspicious for osteomyelitis. 4. Mild marrow edema within the fibular  hallux sesamoid, nonspecific. 5. Soft tissue wound along the medial aspect of the forefoot adjacent to the first metatarsal head. No organized or drainable fluid collection.  Assessment & Plan:  Patient was evaluated and treated and all questions answered.  DX: Left foot osteomyelitis  Wound care: betadine, DSD  Antibiotics: Per primary  DME: post-op shoe  Discussed with patient diagnosis and treatment options.  Imaging reviewed. MRI with changes concerning for osteomyelitis.  Discussed with patient amputation of the first ray to remove any infection. Patient relates he is feeling better and adamant about not wanting any further surgery at this time. Discussed continued wound care and antibiotics for 6 weeks outpatient and patient would prefer to go this route. Given improvement clinically and WBC trending down prior to surgery feel this has a chance of being successful. Discussed if any worsening of the foot would need to reconsider amputation outpatient. Patient  expressed understanding.  Discussed follow-up in our office after discharge and patient relates he has a doctor in [place] he would like to see. Will still have our office call to schedule if he changes his mind.  Podiatry will sign off at this time but will be available if patient changes his mind.   Louann Sjogren, DPM  Accessible via secure chat for questions or concerns.

## 2023-08-28 NOTE — Progress Notes (Signed)
Orthopedic Tech Progress Note Patient Details:  Raymond Walker [birth year] [id number]  Ortho Devices Type of Ortho Device: Postop shoe/boot Ortho Device/Splint Location: Left foot Ortho Device/Splint Interventions: Application   Post Interventions Patient Tolerated: Well  Raymond Walker 08/28/2023, 1:03 PM

## 2023-08-28 NOTE — Progress Notes (Signed)
PHARMACIST LIPID MONITORING   Raymond Walker. is a 72 y.o. male admitted on 08/22/2023 with PVD.  Pharmacy has been consulted to optimize lipid-lowering therapy with the indication of secondary prevention for clinical ASCVD.  Recent Labs:  Lipid Panel (last 6 months):   Lab Results  Component Value Date   CHOL 58 08/27/2023   TRIG 56 08/27/2023   HDL 18 (L) 08/27/2023   CHOLHDL 3.2 08/27/2023   VLDL 11 08/27/2023   LDLCALC 29 08/27/2023    Hepatic function panel (last 6 months):   Lab Results  Component Value Date   AST 15 08/22/2023   ALT 21 08/22/2023   ALKPHOS 68 08/22/2023   BILITOT 0.6 08/22/2023    SCr (since admission):   Serum creatinine: 0.98 mg/dL 09/81/19 1478 Estimated creatinine clearance: 70.3 mL/min  Current therapy and lipid therapy tolerance Current lipid-lowering therapy: Crestor 5mg /day Documented or reported allergies or intolerances to lipid-lowering therapies (if applicable): none   Plan:    1.Statin intensity (high intensity recommended for all patients regardless of the LDL):  Add or increase statin to high intensity.  2.Add ezetimibe (if any one of the following):   Not indicated at this time.  3.Refer to lipid clinic:   No  4.Follow-up with:  Primary care provider - Raymond Sciara, MD  5.Follow-up labs after discharge:  LDL at goal. Check lipid panel in one year  Harland German, PharmD Clinical Pharmacist **Pharmacist phone directory can now be found on [email or web address] ([email or web address]).  Listed under [facility].

## 2023-08-28 NOTE — Evaluation (Signed)
Physical Therapy Evaluation Patient Details Name: Raymond Walker. MRN: [id number] DOB: [birth year] Today's Date: 08/28/2023  History of Present Illness  Pt is a 72 y/o male  presenting to the ED with complaints of wouund to his left foot with increasing pain and redness.  X-ray of the left foot shows osteomyelitis of the first metatarsal head.  Pt s/p cannulation R common femora, angiograms to sites L /R LE's , and stenting L external iliac artery (9/9) and L common femoral endarterectomy and fem/pop BPG ing.  PMHx: COPD, ED, seizures, HTN  Clinical Impression  Pt admitted with/for L foot wound, osteomyelitis, ischemia, s/p revascularization/stenting.  Pt needing minimal assist for basic mobility..  Pt currently limited functionally due to the problems listed below.  (see problems list.)  Pt will benefit from PT to maximize function and safety to be able to get home safely with available assist.         If plan is discharge home, recommend the following: A little help with walking and/or transfers;A little help with bathing/dressing/bathroom;Direct supervision/assist for medications management;Direct supervision/assist for financial management;Assist for transportation   Can travel by private vehicle        Equipment Recommendations Rolling walker (2 wheels) (TBD, pt is a poor historian)  Recommendations for Other Services       Functional Status Assessment Patient has had a recent decline in their functional status and demonstrates the ability to make significant improvements in function in a reasonable and predictable amount of time.     Precautions / Restrictions Precautions Precautions: Fall Required Braces or Orthoses: Other Brace (orthopedic shoe) Restrictions Weight Bearing Restrictions: Yes LLE Weight Bearing: Partial weight bearing LLE Partial Weight Bearing Percentage or Pounds: w/bearing on the heel./      Mobility  Bed Mobility Overal bed mobility: Needs  Assistance Bed Mobility: Supine to Sit, Sit to Supine     Supine to sit: Min assist Sit to supine: Min assist   General bed mobility comments: effortful transition to EOB, slow with minimal assist, assisted/guarding L LE back into bed.    Transfers Overall transfer level: Needs assistance Equipment used: Rolling walker (2 wheels) Transfers: Sit to/from Stand Sit to Stand: Min assist           General transfer comment: cued for hand placement, failed with mod +2, 2nd trial pt pushed for bed with both UE's and stood with min assist.    Ambulation/Gait Ambulation/Gait assistance: Min assist Gait Distance (Feet): 2 Feet (side stepping with RW toward [facility] Assistive device: Rolling walker (2 wheels) Gait Pattern/deviations: Step-to pattern   Gait velocity interpretation: <1.31 ft/sec, indicative of household ambulator   General Gait Details: antalgic and generally steady on L LE with ortho shoe and min assist.  Stairs            Wheelchair Mobility     Tilt Bed    Modified Rankin (Stroke Patients Only)       Balance Overall balance assessment: Needs assistance Sitting-balance support: No upper extremity supported, Feet supported Sitting balance-Leahy Scale: Fair     Standing balance support: Bilateral upper extremity supported, During functional activity Standing balance-Leahy Scale: Poor                               Pertinent Vitals/Pain Pain Assessment Pain Assessment: Faces Faces Pain Scale: Hurts even more Pain Location: L LE Pain Descriptors / Indicators: Discomfort, Grimacing, Guarding Pain Intervention(s):  Monitored during session, Limited activity within patient's tolerance    Home Living Family/patient expects to be discharged to:: Assisted living                   Additional Comments: [facility] [place]    Prior Function               Mobility Comments: reports no AD for short distances, w/c for  longer distances. ADLs Comments: goes to dining hall for meals, facility does meds     Extremity/Trunk Assessment   Upper Extremity Assessment Upper Extremity Assessment: Defer to OT evaluation    Lower Extremity Assessment Lower Extremity Assessment: Generalized weakness;LLE deficits/detail LLE Deficits / Details: painful with resultant weakness, active knee flexion against gravity to 60 deg.  EOB to 90 deg. LLE Coordination: decreased fine motor    Cervical / Trunk Assessment Cervical / Trunk Assessment: Normal  Communication   Communication Communication: No apparent difficulties  Cognition Arousal: Alert Behavior During Therapy: Anxious, WFL for tasks assessed/performed Overall Cognitive Status:  (NT formally)                                          General Comments General comments (skin integrity, edema, etc.): VSS on eval    Exercises     Assessment/Plan    PT Assessment Patient needs continued PT services  PT Problem List Decreased strength;Decreased activity tolerance;Decreased balance;Decreased mobility;Decreased knowledge of use of DME;Pain       PT Treatment Interventions DME instruction;Gait training;Functional mobility training;Therapeutic activities;Patient/family education    PT Goals (Current goals can be found in the Care Plan section)  Acute Rehab PT Goals Patient Stated Goal: walking better. PT Goal Formulation: With patient Time For Goal Achievement: 09/11/23 Potential to Achieve Goals: Good    Frequency Min 1X/week     Co-evaluation               AM-PAC PT "6 Clicks" Mobility  Outcome Measure Help needed turning from your back to your side while in a flat bed without using bedrails?: A Little Help needed moving from lying on your back to sitting on the side of a flat bed without using bedrails?: A Little Help needed moving to and from a bed to a chair (including a wheelchair)?: A Little Help needed standing up  from a chair using your arms (e.g., wheelchair or bedside chair)?: A Little Help needed to walk in hospital room?: A Lot Help needed climbing 3-5 steps with a railing? : A Lot 6 Click Score: 16    End of Session Equipment Utilized During Treatment: Other (comment) (orthopedic shoe) Activity Tolerance: Patient limited by pain Patient left: in bed;with call bell/phone within reach Nurse Communication: Mobility status PT Visit Diagnosis: Other abnormalities of gait and mobility (R26.89);Difficulty in walking, not elsewhere classified (R26.2);Pain Pain - Right/Left: Left Pain - part of body: Leg    Time: 7829-5621 PT Time Calculation (min) (ACUTE ONLY): 27 min   Charges:   PT Evaluation $PT Eval Moderate Complexity: 1 Mod   PT General Charges $$ ACUTE PT VISIT: 1 Visit         08/28/2023  Jacinto Halim., PT Acute Rehabilitation Services [phone]  (office)  Eliseo Gum Sargon Scouten 08/28/2023, 4:36 PM

## 2023-08-28 NOTE — Progress Notes (Signed)
Pharmacy Antibiotic Note  Raymond Walker. is a 72 y.o. male  with L foot osteo and noted with PVD s/p stent and bypass.    Pharmacy has been consulted for vancomycin dosing (he is also on rocephin and flagyl) -WBC= 10.1, afebrile, CrCl ~ 70  Plan: -Continue vancomycin 1250 mg IV q24h -Will check a vancomycin peak and trough -Will follow renal function, cultures and clinical progress    Height: 5\' 11"  (180.3 cm) Weight: 74 kg (163 lb 2.3 oz) IBW/kg (Calculated) : 75.3  Temp (24hrs), Avg:98.4 F (36.9 C), Min:97.7 F (36.5 C), Max:99.3 F (37.4 C)  Recent Labs  Lab 08/22/23 1736 08/23/23 1031 08/26/23 0405 08/27/23 0444 08/28/23 0500  WBC 11.0* 10.2 7.6 6.6 10.1  CREATININE 1.17 1.15 1.03 1.02 0.98  LATICACIDVEN 1.8  --   --   --   --     Estimated Creatinine Clearance: 70.3 mL/min (by C-G formula based on SCr of 0.98 mg/dL).    No Known Allergies  Antimicrobials this admission: Ceftriax 9/5> Vanc 9/6>  Flagyl 9/6>  Dose adjustments this admission:   Microbiology results: 9/5 blood x2- ngtd  Thank you for allowing pharmacy to be a part of this patient's care.  Harland German, PharmD Clinical Pharmacist **Pharmacist phone directory can now be found on [email or web address] ([email or web address]).  Listed under [facility].

## 2023-08-28 NOTE — Consult Note (Signed)
[facility]    Date of Admission:  08/22/2023     Reason for Consult: diabetic foot infection    Referring Provider: Stephania Fragmin     Lines:  Peripheral iv's  Abx: 9/11-c doxy/augmentin  9/5-10 vanc/ceftriaxone/flagyl        Assessment: 72 yo male with PAD, htn, copd, seizure, dm2, hx right foot tma for foot infection/mssa bsi, s/p right LE vascular graft 2021 with surgical site infection s/p I&D and abx course, admitted 9/5 for left foot dm infection   Patient s/p angiogram here and left lower stent  He refused amputation  Cellulitis had resolved on abx  Smoker as of this admission  Sed rate/crp --> 31/10's  Plan: Would transition to doxy and augmentin and give 6 week trial until 10/23 High risk needing surgery/abx failure Will f/u in id clinic in 3-4 weeks on 10/2 @ 245 pm with me Id will sign off Discussed with primary team      ------------------------------------------------ Principal Problem:   Osteomyelitis of foot, left, acute (HCC) Active Problems:   COPD (chronic obstructive pulmonary disease) (HCC)   PAD (peripheral artery disease) (HCC)   Essential hypertension   Status post transmetatarsal amputation of foot, right (HCC)    HPI: Raymond Walker. is a 72 y.o. male PAD, htn, copd, seizure, dm2, hx right foot tma for foot infection/mssa bsi, s/p right LE vascular graft 2021 with surgical site infection s/p I&D and abx course, admitted 9/5 for left foot dm infection   He had 2 weeks of pain/redness/ulcer left foot. No purulence, fever, chill Patient s/p angiogram here and left lower stent  Afebrile on presentation and wbc 11 Mri showed left mtp septic arthritis and mt om of the first ray  Vascular evaluated -> severe disease on abi u/s. S/p angio and lle stent 9/09 Procedure Performed: 1.  Ultrasound-guided cannulation right common femoral artery 2.  Catheter aorta and aortogram with pelvic  angiography 3.  Selection of the left common femoral artery and left lower extremity angiogram 4.  Right lower extremity angiogram 5.  Stent of left external iliac artery with 8 x 40 mm [place] postdilated with 7 mm balloon  He ultimately needed bypass 9/10: 1.  Left common femoral endarterectomy 2.  Left common femoral to above-knee popliteal artery bypass with 6 mm ringed PTFE   No symptoms today Refused surgery/ampuation -- evaluated by podiatry  Current smoker  Crp 10.7 (high sensitive) Sed 31  Family History  Problem Relation Age of Onset   Hypertension Father    Coronary artery disease Father    Coronary artery disease Mother    Cancer Mother        Breast    Social History   Tobacco Use   Smoking status: Every Day    Current packs/day: 1.00    Average packs/day: 1 pack/day for 30.0 years (30.0 ttl pk-yrs)    Types: Cigarettes   Smokeless tobacco: Former    Types: Engineer, drilling   Vaping status: Never Used  Substance Use Topics   Alcohol use: No   Drug use: No    No Known Allergies  Review of Systems: ROS All Other ROS was negative, except mentioned above   Past Medical History:  Diagnosis Date   Caregiver with fatigue 05/02/2020   COPD (chronic obstructive pulmonary disease) (HCC)    Deformity    equinus and metatarsal   Erectile dysfunction  Erectile dysfunction 05/02/2020   Full dentures    History of kidney stones    History of seizures 05/31/2020   Between ages 87 and 35   Hypertension    Impaired glucose tolerance    Pancreatitis, recurrent    Pneumonia    Thrombocytopenia (HCC) 08/10/2018   Staying in the low 100s will follow closely       Scheduled Meds:  amoxicillin-clavulanate  1 tablet Oral Q12H   aspirin EC  81 mg Oral Daily   azelastine  2 spray Each Nare BID   clopidogrel  300 mg Oral Once   Followed by   clopidogrel  75 mg Oral Q breakfast   [START ON 08/29/2023] vitamin B-12  1,000 mcg Oral Daily   doxycycline   100 mg Oral Q12H   heparin injection (subcutaneous)  5,000 Units Subcutaneous Q8H   mometasone-formoterol  2 puff Inhalation BID   pantoprazole  40 mg Oral Daily   [START ON 08/29/2023] rosuvastatin  20 mg Oral Daily   sodium chloride flush  3 mL Intravenous Q12H   trimethoprim-polymyxin b  1 drop Both Eyes Q6H   Continuous Infusions:  sodium chloride     PRN Meds:.sodium chloride, acetaminophen, glucagon (human recombinant), guaiFENesin, hydrALAZINE, ipratropium-albuterol, metoprolol tartrate, morphine injection, ondansetron **OR** ondansetron (ZOFRAN) IV, oxyCODONE, polyethylene glycol, senna-docusate, sodium chloride flush, traMADol, traZODone   OBJECTIVE: Blood pressure 130/87, pulse 78, temperature 97.6 F (36.4 C), temperature source Oral, resp. rate 17, height 5\' 11"  (1.803 m), weight 74 kg, SpO2 100%.  Physical Exam  General/constitutional: no distress, pleasant HEENT: Normocephalic, PER, Conj Clear, EOMI, Oropharynx clear Neck supple CV: rrr no mrg Lungs: clear to auscultation, normal respiratory effort Abd: Soft, Nontender Ext: no edema Skin: No Rash Neuro: nonfocal MSK: s/p right tma -- stump no ulcer; left foot with eschar medial first mtp joint and medial of big toe; slight erythema on top of mtp joint; no tenderness left foot   Lab Results Lab Results  Component Value Date   WBC 10.1 08/28/2023   HGB 9.4 (L) 08/28/2023   HCT 28.4 (L) 08/28/2023   MCV 88.5 08/28/2023   PLT 186 08/28/2023    Lab Results  Component Value Date   CREATININE 0.98 08/28/2023   BUN 20 08/28/2023   NA 138 08/28/2023   K 3.9 08/28/2023   CL 111 08/28/2023   CO2 22 08/28/2023    Lab Results  Component Value Date   ALT 21 08/22/2023   AST 15 08/22/2023   ALKPHOS 68 08/22/2023   BILITOT 0.6 08/22/2023      Microbiology: Recent Results (from the past 240 hour(s))  Blood Cultures x 2 sites     Status: None   Collection Time: 08/22/23  5:36 PM   Specimen: BLOOD LEFT ARM   Result Value Ref Range Status   Specimen Description BLOOD LEFT ARM  Final   Special Requests   Final    BOTTLES DRAWN AEROBIC AND ANAEROBIC Blood Culture adequate volume   Culture   Final    NO GROWTH 6 DAYS Performed at [facility], [address], [place], [place] [address]    Report Status 08/28/2023 FINAL  Final  Blood Cultures x 2 sites     Status: None   Collection Time: 08/22/23  6:27 PM   Specimen: BLOOD  Result Value Ref Range Status   Specimen Description BLOOD RIGHT ANTECUBITAL  Final   Special Requests   Final    BOTTLES DRAWN AEROBIC AND ANAEROBIC  Blood Culture adequate volume   Culture   Final    NO GROWTH 6 DAYS Performed at [facility], [address] [place], [place] [address]    Report Status 08/28/2023 FINAL  Final  Surgical pcr screen     Status: None   Collection Time: 08/27/23  7:48 AM   Specimen: Nasal Mucosa; Nasal Swab  Result Value Ref Range Status   MRSA, PCR NEGATIVE NEGATIVE Final   Staphylococcus aureus NEGATIVE NEGATIVE Final    Comment: (NOTE) The Xpert SA Assay (FDA approved for NASAL specimens in patients 67 years of age and older), is one component of a comprehensive surveillance program. It is not intended to diagnose infection nor to guide or monitor treatment. Performed at [facility], [address], [place], [place] [address]      Serology:    Imaging: If present, new imagings (plain films, ct scans, and mri) have been personally visualized and interpreted; radiology reports have been reviewed. Decision making incorporated into the Impression / Recommendations.  9/6 mri left foot 1. Acute osteomyelitis of the first metatarsal head and base of the great toe proximal phalanx. 2. Small first MTP joint effusion, likely septic arthritis. 3. Bipartite tibial hallux sesamoid with marrow edema, highly suspicious for osteomyelitis. 4. Mild marrow edema within the fibular hallux sesamoid, nonspecific. 5. Soft tissue wound  along the medial aspect of the forefoot adjacent to the first metatarsal head. No organized or drainable fluid collection.   9/6 abi Right: Resting right ankle-brachial index indicates severe right lower  extremity arterial disease.   Left: Resting left ankle-brachial index indicates moderate/severe left  lower extremity arterial disease. The left toe-brachial index is abnormal.     Raymondo Band, MD [facility] [phone] pager    08/28/2023, 3:00 PM

## 2023-08-28 NOTE — Progress Notes (Signed)
PROGRESS NOTE    Raymond Walker.  HQI:[id number] DOB: January 18, 1951 DOA: 08/22/2023 PCP: Babs Sciara, MD     Brief Narrative: 72 year old with past medical history significant for peripheral artery disease, hypertension, COPD, Seizure presented to the ED complaining of increasing pain and redness at the site of his chronic left foot wound.  X-ray of the left foot showed osteomyelitis involving the medial aspect of the first metatarsal head and medial aspect of the base of the first proximal phalanx with overlying soft tissue ulceration.  He was found to have abnormal left ABI requiring vascular consult.  Patient underwent angiogram with stent placement and ultimately needing left common femoral endarterectomy and femoropopliteal bypass on 9/10     Assessment & Plan:   Principal Problem:   Osteomyelitis of foot, left, acute (HCC) Active Problems:   COPD (chronic obstructive pulmonary disease) (HCC)   PAD (peripheral artery disease) (HCC)   Essential hypertension   Status post transmetatarsal amputation of foot, right (HCC)     Left Foot Osteomyelitis: This is secondary to severe peripheral vascular disease/left lower limb with life threatening ischemia.  Currently on vancomycin, Rocephin and Flagyl Seen by podiatry, patient not interested in amputation at this time as he is feeling better status post vascular intervention.  Will need outpatient follow-up.  ID recommended 6 weeks of doxycycline and Augmentin.   Severe peripheral vascular disease with life-threatening left lower extremity limb ischemia 9/9 underwent angiogram followed by left common femoral endarterectomy and femoropopliteal bypass on 9/10.  Postop management per vascular team.  Aspirin, Plavix, statin.  PT/OT.   Hypoglycemia:  Secondary to n.p.o. 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   Weight as of this encounter: 74 kg.     DVT prophylaxis: Lovenox Code Status: DNR limited Family Communication:  Disposition Plan:  Status is: Inpatient Remains inpatient appropriate because: management of left foot infection              Subjective:  No complaints at this time.  Does not want amputation.  Examination:  General exam: Appears calm and comfortable  Respiratory system: Clear to auscultation. Respiratory effort normal. Cardiovascular system: S1 & S2 heard, RRR. No JVD, murmurs, rubs, gallops or clicks. No pedal edema. Gastrointestinal system: Abdomen is nondistended, soft and nontender. No organomegaly or masses felt. Normal bowel sounds heard. Central nervous system: Alert and oriented. No focal neurological deficits. Extremities: Symmetric 5 x 5 power. Skin: No rashes, lesions or ulcers Psychiatry: Judgement and insight appear normal. Mood & affect appropriate.       Diet Orders (From admission, onward)     Start     Ordered   08/28/23 0752  Diet regular Room service appropriate? Yes with Assist; Fluid consistency: Thin  Diet effective now       Question Answer Comment  Room service appropriate? Yes with Assist   Fluid consistency: Thin      08/28/23 0751            Objective: Vitals:   08/27/23 2156 08/27/23 2315 08/28/23 0317 08/28/23 0802  BP:  (!) 97/54 (!) 101/54 130/87  Pulse: 88 84 74 78  Resp: (!) 22 17 20  17  Temp:  97.7 F (36.5 C) 97.8 F (36.6 C) 97.6 F (36.4 C)  TempSrc:  Oral Oral Oral  SpO2:    100%  Weight:      Height:        Intake/Output Summary (Last 24 hours) at 08/28/2023 1247 Last data filed at 08/28/2023 0600 Gross per 24 hour  Intake 2384.23 ml  Output 1200 ml  Net 1184.23 ml   Filed Weights   08/26/23 0700  Weight: 74 kg    Scheduled Meds:  amoxicillin-clavulanate  1  tablet Oral Q12H   aspirin EC  81 mg Oral Daily   azelastine  2 spray Each Nare BID   clopidogrel  300 mg Oral Once   Followed by   clopidogrel  75 mg Oral Q breakfast   [START ON 08/29/2023] vitamin B-12  1,000 mcg Oral Daily   doxycycline  100 mg Oral Q12H   heparin injection (subcutaneous)  5,000 Units Subcutaneous Q8H   mometasone-formoterol  2 puff Inhalation BID   pantoprazole  40 mg Oral Daily   [START ON 08/29/2023] rosuvastatin  20 mg Oral Daily   sodium chloride flush  3 mL Intravenous Q12H   trimethoprim-polymyxin b  1 drop Both Eyes Q6H   Continuous Infusions:  sodium chloride      Nutritional status     Body mass index is 22.75 kg/m.  Data Reviewed:   CBC: Recent Labs  Lab 08/22/23 1736 08/23/23 1031 08/26/23 0405 08/27/23 0444 08/28/23 0500  WBC 11.0* 10.2 7.6 6.6 10.1  NEUTROABS 8.1*  --   --   --   --   HGB 13.4 13.1 13.0 12.4* 9.4*  HCT 40.9 39.1 40.1 38.4* 28.4*  MCV 89.5 86.5 89.3 86.3 88.5  PLT 199 178 182 192 186   Basic Metabolic Panel: Recent Labs  Lab 08/22/23 1736 08/23/23 1031 08/26/23 0405 08/27/23 0444 08/28/23 0500  NA 138 137 137 136 138  K 4.1 4.0 4.3 3.7 3.9  CL 98 101 104 105 111  CO2 28 24 23 24 22   GLUCOSE 115* 182* 67* 125* 170*  BUN 17 16 19 17 20   CREATININE 1.17 1.15 1.03 1.02 0.98  CALCIUM 8.8* 8.5* 8.5* 8.6* 7.9*   GFR: Estimated Creatinine Clearance: 70.3 mL/min (by C-G formula based on SCr of 0.98 mg/dL). Liver Function Tests: Recent Labs  Lab 08/22/23 1736  AST 15  ALT 21  ALKPHOS 68  BILITOT 0.6  PROT 7.5  ALBUMIN 3.3*   No results for input(s): "LIPASE", "AMYLASE" in the last 168 hours. No results for input(s): "AMMONIA" in the last 168 hours. Coagulation Profile: No results for input(s): "INR", "PROTIME" in the last 168 hours. Cardiac Enzymes: No results for input(s): "CKTOTAL", "CKMB", "CKMBINDEX", "TROPONINI" in the last 168 hours. BNP (last 3 results) No results for input(s): "PROBNP" in the  last 8760 hours. HbA1C: No results for input(s): "HGBA1C" in the last 72 hours. CBG: Recent Labs  Lab 08/26/23 1452  GLUCAP 96   Lipid Profile: Recent Labs    08/27/23 0444  CHOL 58  HDL 18*  LDLCALC 29  TRIG 56  CHOLHDL 3.2   Thyroid Function Tests: No results for input(s): "TSH", "T4TOTAL", "FREET4", "T3FREE", "THYROIDAB" in the last 72 hours. Anemia Panel: No results for input(s): "VITAMINB12", "FOLATE", "FERRITIN", "TIBC", "IRON", "RETICCTPCT" in the last 72 hours. Sepsis Labs: Recent Labs  Lab 08/22/23 1736  LATICACIDVEN 1.8    Recent Results (from the past 240 hour(s))  Blood Cultures x 2  sites     Status: None   Collection Time: 08/22/23  5:36 PM   Specimen: BLOOD LEFT ARM  Result Value Ref Range Status   Specimen Description BLOOD LEFT ARM  Final   Special Requests   Final    BOTTLES DRAWN AEROBIC AND ANAEROBIC Blood Culture adequate volume   Culture   Final    NO GROWTH 6 DAYS Performed at [facility], [address], [place], [place] [address]    Report Status 08/28/2023 FINAL  Final  Blood Cultures x 2 sites     Status: None   Collection Time: 08/22/23  6:27 PM   Specimen: BLOOD  Result Value Ref Range Status   Specimen Description BLOOD RIGHT ANTECUBITAL  Final   Special Requests   Final    BOTTLES DRAWN AEROBIC AND ANAEROBIC Blood Culture adequate volume   Culture   Final    NO GROWTH 6 DAYS Performed at [facility], [address], [place], [place] [address]    Report Status 08/28/2023 FINAL  Final  Surgical pcr screen     Status: None   Collection Time: 08/27/23  7:48 AM   Specimen: Nasal Mucosa; Nasal Swab  Result Value Ref Range Status   MRSA, PCR NEGATIVE NEGATIVE Final   Staphylococcus aureus NEGATIVE NEGATIVE Final    Comment: (NOTE) The Xpert SA Assay (FDA approved for NASAL specimens in patients 42 years of age and older), is one component of a comprehensive surveillance program. It is not intended to diagnose infection nor  to guide or monitor treatment. Performed at [facility], [address] [place], [place] [address]          Radiology Studies: PERIPHERAL VASCULAR CATHETERIZATION  Result Date: 08/27/2023 See surgical note for result.  PERIPHERAL VASCULAR CATHETERIZATION  Result Date: 08/26/2023 Images from the original result were not included. Patient name: Raymond Walker. 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 Randie Heinz, MD Procedure Performed: 1.  Ultrasound-guided cannulation right common femoral artery 2.  Catheter aorta and aortogram with pelvic angiography 3.  Selection of the left common femoral artery and left lower extremity angiogram 4.  Right lower extremity angiogram 5.  Stent of left external iliac artery with 8 x 40 mm [place] postdilated with 7 mm balloon 6.  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  He now has left lower extremity ulceration with severely depressed ABIs and a toe pressure 29 mmHg.  He is indicated for angiography with possible intervention. Findings: The aorta is free of flow-limiting stenosis.  There are right common and external iliac artery stents which are patent and the common femoral artery on the right is patent with an occluded bypass which reconstitutes the popliteal artery with runoff dominant via the posterior tibial artery.  The left side is the site of interest.  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  The common femoral artery itself has severe disease which is calcified and this is  actually palpable through the skin.  The SFA has a store takeoff and then it is occluded for  a long segment reconstituting above the knee popliteal artery with dominant runoff via a large posterior tibial artery. Plan will be for left common femoral endarterectomy with femoral to above-knee popliteal artery bypass.  Procedure:  The patient was identified in the holding area and taken to room 8.  The patient was then placed supine on the table and prepped and draped in the usual sterile fashion.  A time out was called.  Ultrasound was used to evaluate the right common femoral artery.  This was patulous and patent.  The area was anesthetized 1% lidocaine and cannulated with a micropuncture needle followed by wire and a sheath.  An ultrasound image was saved to the permanent record.  Concomitantly we administered fentanyl and Versed and his vital signs were monitored throughout the case by bedside nursing.  We placed a Bentson wire followed by a 5 French sheath around the catheter at the level of L1 and performed aortogram and then pulled back the catheter and perform pelvic angiography.  I then crossed the bifurcation perform left lower extremity angiography.  With the above findings I elected to intervene on the left external iliac artery stenosis given that there was a large gradient across this and no palpable left common femoral pulse.  I first placed a Rosen wire and then a long 6 French sheath and 5000 units of heparin was administered.  I then primarily stented with an 8 x 40 mm drug-eluting stent postdilated with 7 mm balloon.  There was now no gradient across the stent and a bounding common femoral pulse on the left although with palpable calcification in the artery itself.  We then exchanged for a short 6 French sheath on the right and performed right lower extremity angiography.  The sheath will be pulled in postoperative holding given history of endarterectomy of the right.  He tolerated the procedure  without any complication. Contrast: 100cc Brandon C. Randie Heinz, MD [facility] [place] Office: [phone] Pager: [phone]           LOS: 6 days   Time spent= 35 mins    Miguel Rota, MD [facility]  If 7PM-7AM, please contact night-coverage  08/28/2023, 12:47 PM

## 2023-08-28 NOTE — Evaluation (Signed)
Occupational Therapy Evaluation Patient Details Name: Raymond Walker. MRN: [id number] DOB: [birth year] Today's Date: 08/28/2023   History of Present Illness Pt is a 72 y/o male  presenting to the ED with complaints of wouund to his left foot with increasing pain and redness.  X-ray of the left foot shows osteomyelitis of the first metatarsal head.  Pt s/p cannulation R common femora, angiograms to sites L /R LE's , and stenting L external iliac artery (9/9) and L common femoral endarterectomy and fem/pop BPG ing.  PMHx: COPD, ED, seizures, HTN   Clinical Impression   Pt reports ind with ADLs, facility assists with IADLs. Pt uses w/c for longer distance mobility, but walks short distances without AD. Pt from [facility], reports facility can provide increased assist if needed. Pt currently needing set up -mod A for ADLs,  min A for bed mobility and min A for transfers with use of RW. Pt nauseated at end of session, dry heaving for ~2 mins before returning to supine in bed. RN notified. Pt presenting with impairments listed below, will follow acutely. Recommend HHOT at ALF at d/c.       If plan is discharge home, recommend the following: A lot of help with walking and/or transfers;A lot of help with bathing/dressing/bathroom;Assistance with cooking/housework;Direct supervision/assist for medications management;Direct supervision/assist for financial management;Assist for transportation    Functional Status Assessment  Patient has had a recent decline in their functional status and demonstrates the ability to make significant improvements in function in a reasonable and predictable amount of time.  Equipment Recommendations  BSC/3in1;Other (comment) (RW)    Recommendations for Other Services PT consult     Precautions / Restrictions Precautions Precautions: Fall Required Braces or Orthoses: Other Brace Other Brace: post op shoe Restrictions Weight Bearing Restrictions: Yes LLE Weight  Bearing: Partial weight bearing LLE Partial Weight Bearing Percentage or Pounds: WB heel only      Mobility Bed Mobility Overal bed mobility: Needs Assistance Bed Mobility: Supine to Sit, Sit to Supine     Supine to sit: Min assist Sit to supine: Min assist   General bed mobility comments: assist for trunk elevation    Transfers Overall transfer level: Needs assistance Equipment used: Rolling walker (2 wheels) Transfers: Sit to/from Stand Sit to Stand: Min assist                  Balance Overall balance assessment: Needs assistance Sitting-balance support: No upper extremity supported, Feet supported Sitting balance-Leahy Scale: Fair     Standing balance support: Bilateral upper extremity supported, During functional activity Standing balance-Leahy Scale: Poor                             ADL either performed or assessed with clinical judgement   ADL Overall ADL's : Needs assistance/impaired Eating/Feeding: Set up   Grooming: Set up;Sitting   Upper Body Bathing: Minimal assistance   Lower Body Bathing: Moderate assistance   Upper Body Dressing : Minimal assistance   Lower Body Dressing: Moderate assistance Lower Body Dressing Details (indicate cue type and reason): donning L sock and post op shoe Toilet Transfer: Minimal assistance;Rolling walker (2 wheels)   Toileting- Clothing Manipulation and Hygiene: Minimal assistance       Functional mobility during ADLs: Minimal assistance;Rolling walker (2 wheels)       Vision   Vision Assessment?: No apparent visual deficits     Perception Perception: Not tested  Praxis Praxis: Not tested       Pertinent Vitals/Pain Pain Assessment Pain Assessment: Faces Pain Score: 8  Faces Pain Scale: Hurts whole lot Pain Location: L LE Pain Descriptors / Indicators: Discomfort, Grimacing, Guarding Pain Intervention(s): Limited activity within patient's tolerance, Monitored during session,  Repositioned     Extremity/Trunk Assessment Upper Extremity Assessment Upper Extremity Assessment: Generalized weakness   Lower Extremity Assessment Lower Extremity Assessment: Defer to PT evaluation LLE Deficits / Details: painful with resultant weakness, active knee flexion against gravity to 60 deg.  EOB to 90 deg. LLE Coordination: decreased fine motor   Cervical / Trunk Assessment Cervical / Trunk Assessment: Normal   Communication Communication Communication: No apparent difficulties   Cognition Arousal: Alert Behavior During Therapy: Anxious, WFL for tasks assessed/performed Overall Cognitive Status: Within Functional Limits for tasks assessed                                 General Comments: follows commands, uses humor appropriately     General Comments  VSS on RA, pt nauseated, dry heaving at end of session,    Exercises     Shoulder Instructions      Home Living Family/patient expects to be discharged to:: Assisted living                                 Additional Comments: [facility] - [facility]      Prior Functioning/Environment               Mobility Comments: reports no AD for short distances, w/c for longer distances. ADLs Comments: goes to dining hall for meals, facility does meds        OT Problem List: Decreased strength;Decreased range of motion;Decreased activity tolerance;Impaired balance (sitting and/or standing);Decreased cognition;Decreased safety awareness      OT Treatment/Interventions: Self-care/ADL training;Therapeutic exercise;Energy conservation;DME and/or AE instruction;Therapeutic activities;Patient/family education;Balance training    OT Goals(Current goals can be found in the care plan section) Acute Rehab OT Goals Patient Stated Goal: none stated OT Goal Formulation: With patient Time For Goal Achievement: 09/11/23 Potential to Achieve Goals: Good ADL Goals Pt Will Perform Upper  Body Dressing: with supervision;sitting Pt Will Perform Lower Body Dressing: with supervision;sitting/lateral leans;sit to/from stand Pt Will Transfer to Toilet: with supervision;ambulating;regular height toilet Pt Will Perform Tub/Shower Transfer: with supervision;Shower transfer;Tub transfer;ambulating Additional ADL Goal #1: pt will be able to stand for x2 functional tasks in order to improve standing tolerance/balance for ADLs  OT Frequency: Min 1X/week    Co-evaluation              AM-PAC OT "6 Clicks" Daily Activity     Outcome Measure Help from another person eating meals?: A Little Help from another person taking care of personal grooming?: A Little Help from another person toileting, which includes using toliet, bedpan, or urinal?: A Little Help from another person bathing (including washing, rinsing, drying)?: A Lot Help from another person to put on and taking off regular upper body clothing?: A Little Help from another person to put on and taking off regular lower body clothing?: A Lot 6 Click Score: 16   End of Session Equipment Utilized During Treatment: Gait belt;Rolling walker (2 wheels) Nurse Communication: Mobility status  Activity Tolerance: Patient tolerated treatment well Patient left: in bed;with call bell/phone within reach;with bed alarm set  OT Visit Diagnosis: Unsteadiness on feet (R26.81);Other abnormalities of gait and mobility (R26.89);Muscle weakness (generalized) (M62.81)                Time: 8295-6213 OT Time Calculation (min): 27 min Charges:  OT General Charges $OT Visit: 1 Visit OT Evaluation $OT Eval Moderate Complexity: 1 Mod  Romana Deaton K, OTD, OTR/L SecureChat Preferred Acute Rehab [phone]   Carver Fila Koonce 08/28/2023, 5:03 PM

## 2023-08-28 NOTE — Progress Notes (Addendum)
  Progress Note    08/28/2023 9:02 AM 1 Day Post-Op  Subjective:  eating breakfast, no major complaints. Wants to go home   Vitals:   08/28/23 0317 08/28/23 0802  BP: (!) 101/54 130/87  Pulse: 74 78  Resp: 20 17  Temp: 97.8 F (36.6 C) 97.6 F (36.4 C)  SpO2:  100%   Physical Exam: Cardiac:  regular Lungs:  non labored Incisions:  Left leg incisions are intact and well appearing, no swelling or hematoma Extremities:  LLE well perfused and warm with brisk doppler PT/ DP signals, calf soft Abdomen:  soft Neurologic: alert and oriented  CBC    Component Value Date/Time   WBC 10.1 08/28/2023 0500   RBC 3.21 (L) 08/28/2023 0500   HGB 9.4 (L) 08/28/2023 0500   HGB 15.7 12/12/2022 1137   HCT 28.4 (L) 08/28/2023 0500   HCT 46.6 12/12/2022 1137   PLT 186 08/28/2023 0500   PLT 139 (L) 12/12/2022 1137   MCV 88.5 08/28/2023 0500   MCV 87 12/12/2022 1137   MCH 29.3 08/28/2023 0500   MCHC 33.1 08/28/2023 0500   RDW 14.1 08/28/2023 0500   RDW 13.6 12/12/2022 1137   LYMPHSABS 1.5 08/22/2023 1736   LYMPHSABS 1.6 12/12/2022 1137   MONOABS 1.3 (H) 08/22/2023 1736   EOSABS 0.1 08/22/2023 1736   EOSABS 0.1 12/12/2022 1137   BASOSABS 0.1 08/22/2023 1736   BASOSABS 0.1 12/12/2022 1137    BMET    Component Value Date/Time   NA 138 08/28/2023 0500   NA 144 12/12/2022 1137   K 3.9 08/28/2023 0500   CL 111 08/28/2023 0500   CO2 22 08/28/2023 0500   GLUCOSE 170 (H) 08/28/2023 0500   BUN 20 08/28/2023 0500   BUN 19 12/12/2022 1137   CREATININE 0.98 08/28/2023 0500   CREATININE 0.70 08/01/2018 1126   CALCIUM 7.9 (L) 08/28/2023 0500   GFRNONAA >60 08/28/2023 0500   GFRNONAA 97 08/01/2018 1126   GFRAA >60 08/24/2020 1221   GFRAA 112 08/01/2018 1126    INR    Component Value Date/Time   INR 1.3 (H) 05/21/2020 1829     Intake/Output Summary (Last 24 hours) at 08/28/2023 0902 Last data filed at 08/28/2023 0600 Gross per 24 hour  Intake 2384.23 ml  Output 1200 ml  Net  1184.23 ml     Assessment/Plan:  72 y.o. male is s/p 1.  Left common femoral endarterectomy 2.  Left common femoral to above-knee popliteal artery bypass with 6 mm ringed PTFE 1 Day Post-Op   Left leg is well perfused and warm with Palpable PT. Brisk PT/ DP signals Incisions are intact and well appearing, no swelling or hematoma Podiatry offered 1st ray amputation but patient is not agreeable at this time. Recommendation is for 6 weeks of abx Continue Aspirin, statin, Plavix Mobilize/ OOB today PT/OT to eval with recs  Graceann Congress, PA-C [facility] [phone] 08/28/2023 9:02 AM  I have independently interviewed and examined patient and agree with PA assessment and plan above.   Shaneisha Burkel C. Randie Heinz, MD [facility] [place] Office: [phone] Pager: [phone]

## 2023-08-29 DIAGNOSIS — I10 Essential (primary) hypertension: Secondary | ICD-10-CM | POA: Diagnosis not present

## 2023-08-29 DIAGNOSIS — I739 Peripheral vascular disease, unspecified: Secondary | ICD-10-CM | POA: Diagnosis not present

## 2023-08-29 DIAGNOSIS — E876 Hypokalemia: Secondary | ICD-10-CM

## 2023-08-29 DIAGNOSIS — M86172 Other acute osteomyelitis, left ankle and foot: Secondary | ICD-10-CM | POA: Diagnosis not present

## 2023-08-29 LAB — CBC
HCT: 26.4 % — ABNORMAL LOW (ref 39.0–52.0)
Hemoglobin: 8.5 g/dL — ABNORMAL LOW (ref 13.0–17.0)
MCH: 28.2 pg (ref 26.0–34.0)
MCHC: 32.2 g/dL (ref 30.0–36.0)
MCV: 87.7 fL (ref 80.0–100.0)
Platelets: 190 10*3/uL (ref 150–400)
RBC: 3.01 MIL/uL — ABNORMAL LOW (ref 4.22–5.81)
RDW: 14.6 % (ref 11.5–15.5)
WBC: 8.2 10*3/uL (ref 4.0–10.5)
nRBC: 0 % (ref 0.0–0.2)

## 2023-08-29 LAB — BASIC METABOLIC PANEL
Anion gap: 9 (ref 5–15)
BUN: 20 mg/dL (ref 8–23)
CO2: 22 mmol/L (ref 22–32)
Calcium: 8.2 mg/dL — ABNORMAL LOW (ref 8.9–10.3)
Chloride: 109 mmol/L (ref 98–111)
Creatinine, Ser: 1.09 mg/dL (ref 0.61–1.24)
GFR, Estimated: >60 mL/min (ref >60)
Glucose, Bld: 116 mg/dL — ABNORMAL HIGH (ref 70–99)
Potassium: 3.4 mmol/L — ABNORMAL LOW (ref 3.5–5.1)
Sodium: 140 mmol/L (ref 135–145)

## 2023-08-29 LAB — MAGNESIUM: Magnesium: 1.6 mg/dL — ABNORMAL LOW (ref 1.7–2.4)

## 2023-08-29 LAB — PHOSPHORUS: Phosphorus: 2.5 mg/dL (ref 2.5–4.6)

## 2023-08-29 MED ORDER — POTASSIUM CHLORIDE CRYS ER 20 MEQ PO TBCR
40.0000 meq | EXTENDED_RELEASE_TABLET | Freq: Once | ORAL | Status: AC
  Administered 2023-08-29: 40 meq via ORAL
  Filled 2023-08-29: qty 2

## 2023-08-29 MED ORDER — ENOXAPARIN SODIUM 40 MG/0.4ML IJ SOSY
40.0000 mg | PREFILLED_SYRINGE | INTRAMUSCULAR | Status: DC
  Administered 2023-08-29: 40 mg via SUBCUTANEOUS
  Filled 2023-08-29: qty 0.4

## 2023-08-29 MED ORDER — MAGNESIUM SULFATE 4 GM/100ML IV SOLN
4.0000 g | Freq: Once | INTRAVENOUS | Status: AC
  Administered 2023-08-29: 4 g via INTRAVENOUS
  Filled 2023-08-29: qty 100

## 2023-08-29 NOTE — Progress Notes (Signed)
Mobility Specialist Progress Note:   08/29/23 1653  Mobility  Activity Transferred from chair to bed  Level of Assistance Minimal assist, patient does 75% or more  Assistive Device Front wheel walker  Distance Ambulated (ft) 3 ft  LLE Weight Bearing PWB  Activity Response Tolerated well  Mobility Referral Yes  $Mobility charge 1 Mobility  Mobility Specialist Start Time (ACUTE ONLY) 1630  Mobility Specialist Stop Time (ACUTE ONLY) 1645  Mobility Specialist Time Calculation (min) (ACUTE ONLY) 15 min   Pt received in chair, requesting to go back to bed. MinA to stand and pivot. Pt c/o LLE pain. RN notified. Pt left in bed with RN present in room.  Leory Plowman  Mobility Specialist Please contact via [employer] office at [phone]

## 2023-08-29 NOTE — Plan of Care (Signed)
  Problem: Education: Goal: Knowledge of General Education information will improve Description Including pain rating scale, medication(s)/side effects and non-pharmacologic comfort measures Outcome: Progressing   Problem: Health Behavior/Discharge Planning: Goal: Ability to manage health-related needs will improve Outcome: Progressing   

## 2023-08-29 NOTE — Plan of Care (Signed)
  Problem: Education: Goal: Knowledge of General Education information will improve Description: Including pain rating scale, medication(s)/side effects and non-pharmacologic comfort measures Outcome: Progressing   Problem: Health Behavior/Discharge Planning: Goal: Ability to manage health-related needs will improve Outcome: Progressing   Problem: Activity: Goal: Risk for activity intolerance will decrease Outcome: Progressing   Problem: Coping: Goal: Level of anxiety will decrease Outcome: Progressing   Problem: Pain Managment: Goal: General experience of comfort will improve Outcome: Progressing   Problem: Cardiovascular: Goal: Ability to achieve and maintain adequate cardiovascular perfusion will improve Outcome: Progressing Goal: Vascular access site(s) Level 0-1 will be maintained Outcome: Progressing   Problem: Health Behavior/Discharge Planning: Goal: Ability to safely manage health-related needs after discharge will improve Outcome: Progressing

## 2023-08-29 NOTE — Progress Notes (Signed)
[facility] PROGRESS NOTE   Raymond Walker. OZH:[id number] DOB: Jun 23, 1951 DOA: 08/22/2023  PCP: Babs Sciara, MD  Brief History/Interval Summary: 72 year old with past medical history significant for peripheral artery disease, hypertension, COPD, Seizure presented to the ED complaining of increasing pain and redness at the site of his chronic left foot wound. X-ray of the left foot showed osteomyelitis involving the medial aspect of the first metatarsal head and medial aspect of the base of the first proximal phalanx with overlying soft tissue ulceration. He was found to have abnormal left ABI requiring vascular consult. Patient underwent angiogram with stent placement and ultimately needing left common femoral endarterectomy and femoropopliteal bypass on 9/10   Consultants: Podiatry.  Vascular surgery.  Infectious disease  Procedures: Left common femoral endarterectomy and femoropopliteal bypass    Subjective/Interval History: Patient denies any chest pain shortness of breath.  No nausea or vomiting.  Occasional tingling sensation in the left lower extremity.    Assessment/Plan:  Left Foot Osteomyelitis: This is secondary to severe peripheral vascular disease/left lower limb with life threatening ischemia.  Patient was placed on vancomycin, Rocephin and Flagyl Seen by podiatry, patient not interested in amputation at this time as he is feeling better status post vascular intervention.  Will need outpatient follow-up.   ID recommended 6 weeks of doxycycline and Augmentin.  ID to follow-up in their office.   Severe peripheral vascular disease with life-threatening left lower extremity limb ischemia 9/9 underwent angiogram followed by left common femoral endarterectomy and femoropopliteal bypass on 9/10.  Postop management per vascular team.   Aspirin, Plavix, statin.  PT/OT.   Hypoglycemia:  Secondary to n.p.o. status. now improving  Hypokalemia/hypomagnesemia Will be  supplemented  Normocytic anemia Drop in hemoglobin is likely due to procedure and hemodilution.  No overt bleeding noted.  Recheck labs tomorrow.  Anemia panel.    Essential hypertension: Stable   COPD: Bronchodilators   Hyperlipidemia:  -Statin   B 12 Deficiency:  -Supplements   DVT Prophylaxis: Subcutaneous heparin Code Status: DNR Family Communication: Discussed with patient Disposition Plan: Anticipate discharge tomorrow.  Discussed with Dr. Randie Heinz with vascular surgery.    Medications: Scheduled:  amoxicillin-clavulanate  1 tablet Oral Q12H   aspirin EC  81 mg Oral Daily   azelastine  2 spray Each Nare BID   clopidogrel  300 mg Oral Once   Followed by   clopidogrel  75 mg Oral Q breakfast   vitamin B-12  1,000 mcg Oral Daily   doxycycline  100 mg Oral Q12H   heparin injection (subcutaneous)  5,000 Units Subcutaneous Q8H   mometasone-formoterol  2 puff Inhalation BID   pantoprazole  40 mg Oral Daily   rosuvastatin  20 mg Oral Daily   sodium chloride flush  3 mL Intravenous Q12H   trimethoprim-polymyxin b  1 drop Both Eyes Q6H   Continuous:  sodium chloride     magnesium sulfate bolus IVPB 4 g (08/29/23 0852)   GEX:BMWUXL chloride, acetaminophen, glucagon (human recombinant), guaiFENesin, hydrALAZINE, ipratropium-albuterol, metoprolol tartrate, morphine injection, ondansetron **OR** ondansetron (ZOFRAN) IV, oxyCODONE, polyethylene glycol, senna-docusate, sodium chloride flush, traMADol, traZODone  Antibiotics: Anti-infectives (From admission, onward)    Start     Dose/Rate Route Frequency Ordered Stop   08/28/23 1300  doxycycline (VIBRA-TABS) tablet 100 mg        100 mg Oral Every 12 hours 08/28/23 1205     08/28/23 1300  amoxicillin-clavulanate (AUGMENTIN) 875-125 MG per tablet 1 tablet  1 tablet Oral Every 12 hours 08/28/23 1205     08/27/23 1100  cefTRIAXone (ROCEPHIN) 2 g in sodium chloride 0.9 % 100 mL IVPB  Status:  Discontinued        2 g 200  mL/hr over 30 Minutes Intravenous Every 24 hours 08/26/23 1025 08/26/23 1027   08/26/23 1130  cefTRIAXone (ROCEPHIN) 2 g in sodium chloride 0.9 % 100 mL IVPB  Status:  Discontinued        2 g 200 mL/hr over 30 Minutes Intravenous Every 24 hours 08/26/23 1027 08/28/23 1205   08/24/23 0900  vancomycin (VANCOREADY) IVPB 1250 mg/250 mL  Status:  Discontinued        1,250 mg 166.7 mL/hr over 90 Minutes Intravenous Every 24 hours 08/23/23 0804 08/28/23 1205   08/23/23 0900  cefTRIAXone (ROCEPHIN) 2 g in sodium chloride 0.9 % 100 mL IVPB  Status:  Discontinued        2 g 200 mL/hr over 30 Minutes Intravenous Daily 08/23/23 0756 08/26/23 1025   08/23/23 0900  metroNIDAZOLE (FLAGYL) IVPB 500 mg  Status:  Discontinued        500 mg 100 mL/hr over 60 Minutes Intravenous 2 times daily 08/23/23 0756 08/28/23 1205   08/23/23 0900  vancomycin (VANCOREADY) IVPB 1750 mg/350 mL        1,750 mg 175 mL/hr over 120 Minutes Intravenous  Once 08/23/23 0804 08/23/23 1442   08/22/23 2200  metroNIDAZOLE (FLAGYL) tablet 500 mg  Status:  Discontinued       Placed in "And" Linked Group   500 mg Oral Every 12 hours 08/22/23 1713 08/22/23 2047   08/22/23 1715  cefTRIAXone (ROCEPHIN) 2 g in sodium chloride 0.9 % 100 mL IVPB  Status:  Discontinued       Placed in "And" Linked Group   2 g 200 mL/hr over 30 Minutes Intravenous Every 24 hours 08/22/23 1713 08/22/23 2047       Objective:  Vital Signs  Vitals:   08/29/23 0100 08/29/23 0344 08/29/23 0739 08/29/23 0829  BP: (!) 121/57 117/62  (!) 122/54  Pulse: 90 90 95 86  Resp: (!) 21 18 18 20   Temp:  98.7 F (37.1 C)  98.3 F (36.8 C)  TempSrc:  Oral  Tympanic  SpO2: 92% 92%  93%  Weight:      Height:        Intake/Output Summary (Last 24 hours) at 08/29/2023 0903 Last data filed at 08/29/2023 0830 Gross per 24 hour  Intake 590 ml  Output 1353 ml  Net -763 ml   Filed Weights   08/26/23 0700  Weight: 74 kg    General appearance: Awake alert.  In no  distress Resp: Clear to auscultation bilaterally.  Normal effort Cardio: S1-S2 is normal regular.  No S3-S4.  No rubs murmurs or bruit GI: Abdomen is soft.  Nontender nondistended.  Bowel sounds are present normal.  No masses organomegaly No obvious focal neurological deficits.   Lab Results:  Data Reviewed: I have personally reviewed following labs and reports of the imaging studies  CBC: Recent Labs  Lab 08/22/23 1736 08/23/23 1031 08/26/23 0405 08/27/23 0444 08/28/23 0500 08/29/23 0509  WBC 11.0* 10.2 7.6 6.6 10.1 8.2  NEUTROABS 8.1*  --   --   --   --   --   HGB 13.4 13.1 13.0 12.4* 9.4* 8.5*  HCT 40.9 39.1 40.1 38.4* 28.4* 26.4*  MCV 89.5 86.5 89.3 86.3 88.5 87.7  PLT 199 178  182 192 186 190    Basic Metabolic Panel: Recent Labs  Lab 08/23/23 1031 08/26/23 0405 08/27/23 0444 08/28/23 0500 08/29/23 0509  NA 137 137 136 138 140  K 4.0 4.3 3.7 3.9 3.4*  CL 101 104 105 111 109  CO2 24 23 24 22 22   GLUCOSE 182* 67* 125* 170* 116*  BUN 16 19 17 20 20   CREATININE 1.15 1.03 1.02 0.98 1.09  CALCIUM 8.5* 8.5* 8.6* 7.9* 8.2*  MG  --   --   --   --  1.6*  PHOS  --   --   --   --  2.5    GFR: Estimated Creatinine Clearance: 63.2 mL/min (by C-G formula based on SCr of 1.09 mg/dL).  Liver Function Tests: Recent Labs  Lab 08/22/23 1736  AST 15  ALT 21  ALKPHOS 68  BILITOT 0.6  PROT 7.5  ALBUMIN 3.3*     CBG: Recent Labs  Lab 08/26/23 1452  GLUCAP 96    Lipid Profile: Recent Labs    08/27/23 0444  CHOL 58  HDL 18*  LDLCALC 29  TRIG 56  CHOLHDL 3.2     Recent Results (from the past 240 hour(s))  Blood Cultures x 2 sites     Status: None   Collection Time: 08/22/23  5:36 PM   Specimen: BLOOD LEFT ARM  Result Value Ref Range Status   Specimen Description BLOOD LEFT ARM  Final   Special Requests   Final    BOTTLES DRAWN AEROBIC AND ANAEROBIC Blood Culture adequate volume   Culture   Final    NO GROWTH 6 DAYS Performed at [facility], [address], [place], [place] [address]    Report Status 08/28/2023 FINAL  Final  Blood Cultures x 2 sites     Status: None   Collection Time: 08/22/23  6:27 PM   Specimen: BLOOD  Result Value Ref Range Status   Specimen Description BLOOD RIGHT ANTECUBITAL  Final   Special Requests   Final    BOTTLES DRAWN AEROBIC AND ANAEROBIC Blood Culture adequate volume   Culture   Final    NO GROWTH 6 DAYS Performed at [facility], [address], [place], [place] [address]    Report Status 08/28/2023 FINAL  Final  Surgical pcr screen     Status: None   Collection Time: 08/27/23  7:48 AM   Specimen: Nasal Mucosa; Nasal Swab  Result Value Ref Range Status   MRSA, PCR NEGATIVE NEGATIVE Final   Staphylococcus aureus NEGATIVE NEGATIVE Final    Comment: (NOTE) The Xpert SA Assay (FDA approved for NASAL specimens in patients 53 years of age and older), is one component of a comprehensive surveillance program. It is not intended to diagnose infection nor to guide or monitor treatment. Performed at [facility], [address], [place], [place] [address]       Radiology Studies: PERIPHERAL VASCULAR CATHETERIZATION  Result Date: 08/27/2023 See surgical note for result.      LOS: 7 days   Manasseh Pittsley [employer] on [email or web address]  08/29/2023, 9:03 AM

## 2023-08-29 NOTE — Progress Notes (Signed)
PT Cancellation Note  Patient Details Name: Raymond Walker. MRN: [id number] DOB: 09-Nov-1951   Cancelled Treatment:   Attempted to see pt twice for PT treatment. First attempt, pt declining therapy until he received his meds and ate breakfast. Offered to assist pt into recliner to eat or set pt up in bed and pt refused. On second attempt, pt still had not eaten despite given time, and refusing to participate in therapy stating "If I say no, then there's no way you're going to get me to do it". Will attempt again as schedule allows.    Raymond Walker Blima Rich PT, DPT 08/29/2023, 8:48 AM

## 2023-08-29 NOTE — Progress Notes (Signed)
Mobility Specialist Progress Note:   08/29/23 1525  Mobility  Activity Transferred from bed to chair  Level of Assistance Minimal assist, patient does 75% or more  Assistive Device Front wheel walker  Distance Ambulated (ft) 3 ft  LLE Weight Bearing PWB  Activity Response Tolerated fair  Mobility Referral Yes  $Mobility charge 1 Mobility  Mobility Specialist Start Time (ACUTE ONLY) 1448  Mobility Specialist Stop Time (ACUTE ONLY) 1505  Mobility Specialist Time Calculation (min) (ACUTE ONLY) 17 min   Pre Mobility: 102 HR  During Mobility: 100 HR 142/69 BP Post Mobility: 102 HR  Pt received in bed, agreeable to mobility. When transitioning to EOB pt c/o dizziness requesting to lay back down. VSS. Pt able to stand and pivot within precautions with MinA and elevated bed height. Pt left in chair asymptomatic with call bell in reach and chair alarm on.   Leory Plowman  Mobility Specialist Please contact via [employer] Rehab office at [phone]

## 2023-08-29 NOTE — Discharge Instructions (Signed)

## 2023-08-29 NOTE — Progress Notes (Addendum)
  Progress Note    08/29/2023 7:56 AM 2 Days Post-Op  Subjective:  no major complaints, says some tingling in his left knee and feels stiff around AK pop incision   Vitals:   08/29/23 0344 08/29/23 0739  BP: 117/62   Pulse: 90 95  Resp: 18 18  Temp: 98.7 F (37.1 C)   SpO2: 92%    Physical Exam: Cardiac:  regular Lungs:  non labored Incisions:  left groin and AK pop incisions are intact and well appearing Extremities:  LLE well perfused and warm with brisk PT/DP signals Abdomen:  soft Neurologic: alert and oriented  CBC    Component Value Date/Time   WBC 8.2 08/29/2023 0509   RBC 3.01 (L) 08/29/2023 0509   HGB 8.5 (L) 08/29/2023 0509   HGB 15.7 12/12/2022 1137   HCT 26.4 (L) 08/29/2023 0509   HCT 46.6 12/12/2022 1137   PLT 190 08/29/2023 0509   PLT 139 (L) 12/12/2022 1137   MCV 87.7 08/29/2023 0509   MCV 87 12/12/2022 1137   MCH 28.2 08/29/2023 0509   MCHC 32.2 08/29/2023 0509   RDW 14.6 08/29/2023 0509   RDW 13.6 12/12/2022 1137   LYMPHSABS 1.5 08/22/2023 1736   LYMPHSABS 1.6 12/12/2022 1137   MONOABS 1.3 (H) 08/22/2023 1736   EOSABS 0.1 08/22/2023 1736   EOSABS 0.1 12/12/2022 1137   BASOSABS 0.1 08/22/2023 1736   BASOSABS 0.1 12/12/2022 1137    BMET    Component Value Date/Time   NA 140 08/29/2023 0509   NA 144 12/12/2022 1137   K 3.4 (L) 08/29/2023 0509   CL 109 08/29/2023 0509   CO2 22 08/29/2023 0509   GLUCOSE 116 (H) 08/29/2023 0509   BUN 20 08/29/2023 0509   BUN 19 12/12/2022 1137   CREATININE 1.09 08/29/2023 0509   CREATININE 0.70 08/01/2018 1126   CALCIUM 8.2 (L) 08/29/2023 0509   GFRNONAA >60 08/29/2023 0509   GFRNONAA 97 08/01/2018 1126   GFRAA >60 08/24/2020 1221   GFRAA 112 08/01/2018 1126    INR    Component Value Date/Time   INR 1.3 (H) 05/21/2020 1829     Intake/Output Summary (Last 24 hours) at 08/29/2023 0756 Last data filed at 08/29/2023 0345 Gross per 24 hour  Intake 830 ml  Output 1475 ml  Net -645 ml      Assessment/Plan:  72 y.o. male is s/p  1.  Left common femoral endarterectomy 2.  Left common femoral to above-knee popliteal artery bypass with 6 mm ringed PTFE 2 Days Post-Op   Left leg remains well perfused and warm with brisk PT/ DP doppler signals Incisions are all intact and well appearing Remains on broad spect Abx. ID recommending 6 weeks Doxy + Augmentin Asa, statin, Plavix Continue to mobilize as able  PT/OT recommending HH  Remains stable from vascular standpoint  Dory Horn [facility] [phone] 08/29/2023 7:56 AM  I have independently interviewed and examined patient and agree with PA assessment and plan above.   Audrina Marten C. Randie Heinz, MD [facility] [place] Office: [phone] Pager: [phone]

## 2023-08-30 ENCOUNTER — Other Ambulatory Visit (HOSPITAL_COMMUNITY): Payer: Self-pay

## 2023-08-30 DIAGNOSIS — M86172 Other acute osteomyelitis, left ankle and foot: Secondary | ICD-10-CM | POA: Diagnosis not present

## 2023-08-30 LAB — FERRITIN: Ferritin: 206 ng/mL (ref 24–336)

## 2023-08-30 LAB — BASIC METABOLIC PANEL
Anion gap: 11 (ref 5–15)
BUN: 18 mg/dL (ref 8–23)
CO2: 22 mmol/L (ref 22–32)
Calcium: 8.5 mg/dL — ABNORMAL LOW (ref 8.9–10.3)
Chloride: 106 mmol/L (ref 98–111)
Creatinine, Ser: 1.01 mg/dL (ref 0.61–1.24)
GFR, Estimated: >60 mL/min (ref >60)
Glucose, Bld: 157 mg/dL — ABNORMAL HIGH (ref 70–99)
Potassium: 4.5 mmol/L (ref 3.5–5.1)
Sodium: 139 mmol/L (ref 135–145)

## 2023-08-30 LAB — MAGNESIUM: Magnesium: 1.9 mg/dL (ref 1.7–2.4)

## 2023-08-30 LAB — RETICULOCYTES
Immature Retic Fract: 30.3 % — ABNORMAL HIGH (ref 2.3–15.9)
RBC.: 3.12 MIL/uL — ABNORMAL LOW (ref 4.22–5.81)
Retic Count, Absolute: 82.1 10*3/uL (ref 19.0–186.0)
Retic Ct Pct: 2.6 % (ref 0.4–3.1)

## 2023-08-30 LAB — CBC
HCT: 29.1 % — ABNORMAL LOW (ref 39.0–52.0)
Hemoglobin: 9.2 g/dL — ABNORMAL LOW (ref 13.0–17.0)
MCH: 28.4 pg (ref 26.0–34.0)
MCHC: 31.6 g/dL (ref 30.0–36.0)
MCV: 89.8 fL (ref 80.0–100.0)
Platelets: 234 10*3/uL (ref 150–400)
RBC: 3.24 MIL/uL — ABNORMAL LOW (ref 4.22–5.81)
RDW: 14.8 % (ref 11.5–15.5)
WBC: 9.9 10*3/uL (ref 4.0–10.5)
nRBC: 0 % (ref 0.0–0.2)

## 2023-08-30 LAB — VITAMIN B12: Vitamin B-12: 1867 pg/mL — ABNORMAL HIGH (ref 180–914)

## 2023-08-30 LAB — IRON AND TIBC
Iron: 33 ug/dL — ABNORMAL LOW (ref 45–182)
Saturation Ratios: 18 % (ref 17.9–39.5)
TIBC: 185 ug/dL — ABNORMAL LOW (ref 250–450)
UIBC: 152 ug/dL

## 2023-08-30 LAB — FOLATE: Folate: 12.1 ng/mL (ref >5.9)

## 2023-08-30 MED ORDER — CLOPIDOGREL BISULFATE 75 MG PO TABS
75.0000 mg | ORAL_TABLET | Freq: Every day | ORAL | 2 refills | Status: DC
  Filled 2023-08-30: qty 30, 30d supply, fill #0

## 2023-08-30 MED ORDER — ASPIRIN 81 MG PO TBEC
81.0000 mg | DELAYED_RELEASE_TABLET | Freq: Every day | ORAL | 0 refills | Status: DC
  Filled 2023-08-30: qty 120, 120d supply, fill #0

## 2023-08-30 MED ORDER — POLYETHYLENE GLYCOL 3350 17 GM/SCOOP PO POWD
17.0000 g | Freq: Every day | ORAL | 0 refills | Status: DC | PRN
  Filled 2023-08-30: qty 238, 14d supply, fill #0

## 2023-08-30 MED ORDER — CYANOCOBALAMIN 1000 MCG PO TABS
1000.0000 ug | ORAL_TABLET | Freq: Every day | ORAL | 2 refills | Status: DC
  Filled 2023-08-30: qty 30, 30d supply, fill #0

## 2023-08-30 MED ORDER — ROSUVASTATIN CALCIUM 20 MG PO TABS
20.0000 mg | ORAL_TABLET | Freq: Every day | ORAL | 2 refills | Status: DC
  Filled 2023-08-30: qty 30, 30d supply, fill #0

## 2023-08-30 MED ORDER — OXYCODONE HCL 5 MG PO TABS
5.0000 mg | ORAL_TABLET | Freq: Four times a day (QID) | ORAL | 0 refills | Status: DC | PRN
  Filled 2023-08-30: qty 30, 8d supply, fill #0

## 2023-08-30 MED ORDER — DOXYCYCLINE HYCLATE 100 MG PO TABS
100.0000 mg | ORAL_TABLET | Freq: Two times a day (BID) | ORAL | 0 refills | Status: DC
  Filled 2023-08-30: qty 80, 40d supply, fill #0

## 2023-08-30 MED ORDER — AMOXICILLIN-POT CLAVULANATE 875-125 MG PO TABS
1.0000 | ORAL_TABLET | Freq: Two times a day (BID) | ORAL | 0 refills | Status: DC
  Filled 2023-08-30: qty 80, 40d supply, fill #0

## 2023-08-30 MED ORDER — OXYCODONE HCL 5 MG PO TABS: 5.0000 mg | ORAL_TABLET | Freq: Four times a day (QID) | ORAL | 0 refills | Status: DC | PRN

## 2023-08-30 NOTE — NC FL2 (Signed)
[place] MEDICAID FL2 LEVEL OF CARE FORM     IDENTIFICATION  Patient Name: Raymond Walker. Birthdate: 08/31/1951 Sex: male Admission Date (Current Location): 08/22/2023  [place] Number:  [employer] and Address:  [facility] [place], [address] [place], [place] [address]      Provider Number: [id number]  Attending Physician Name and Address:  Osvaldo Shipper, MD  Relative Name and Phone Number:  Joylene Igo (niece) [phone]    Current Level of Care: Other (Comment) (ALF) Recommended Level of Care: Assisted Living Facility Prior Approval Number:    Date Approved/Denied:   PASRR Number:    Discharge Plan: Other (Comment) (ALF)    Current Diagnoses: Patient Active Problem List   Diagnosis Date Noted   Osteomyelitis of foot, left, acute (HCC) 08/22/2023   Status post transmetatarsal amputation of foot, 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   Right ureteral stone 01/01/2022   Abdominal pain 01/01/2022   Sepsis secondary to UTI (HCC) 12/06/2021   Leukocytosis 12/06/2021   Hypoalbuminemia due to protein-calorie malnutrition (HCC) 12/06/2021   Lactic acidosis 12/06/2021   AKI (acute kidney injury) (HCC) 12/06/2021   Essential hypertension 12/06/2021   Mixed hyperlipidemia 12/06/2021   GERD (gastroesophageal reflux disease) 12/06/2021   Nephrolithiasis 11/21/2021   Low back pain 09/19/2021   Cognitive dysfunction 06/23/2020   History of seizures 05/31/2020   Erectile dysfunction 05/02/2020   Caregiver with fatigue 05/02/2020   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HCC)    Goals of care, counseling/discussion    Palliative care by specialist    DNR (do not resuscitate)    Vitamin D deficiency 01/24/2020   Sepsis (HCC) 01/23/2020   Altered mental status    Hypokalemia    History of kidney stones    Hematuria    UTI (urinary tract infection)    CHRONIC Bladder outlet obstruction    PAD (peripheral artery disease) (HCC) 09/15/2019    Protein-calorie malnutrition, severe 08/21/2019   Cellulitis of right lower extremity    Impaired glucose tolerance    COPD (chronic obstructive pulmonary disease) (HCC)    Thrombocytopenia (HCC) 08/10/2018   Aortic atherosclerosis (HCC) 10/14/2016   Coronary artery calcification seen on CAT scan 10/14/2016   Other emphysema (HCC) 10/14/2016   Elevated blood pressure 12/03/2011   Chest pain 12/03/2011   Tobacco abuse 12/03/2011   Right leg pain 12/03/2011    Orientation RESPIRATION BLADDER Height & Weight     Self, Situation, Place, Time  Normal Continent Weight: 169 lb 12.1 oz (77 kg) Height:  5\' 11"  (180.3 cm)  BEHAVIORAL SYMPTOMS/MOOD NEUROLOGICAL BOWEL NUTRITION STATUS      Continent Diet (low sodium diet)  AMBULATORY STATUS COMMUNICATION OF NEEDS Skin     Verbally Surgical wounds (closed incison left leg, closed incision left groin)                       Personal Care Assistance Level of Assistance  Bathing, Feeding, Dressing Bathing Assistance: Limited assistance Feeding assistance: Independent Dressing Assistance: Limited assistance     Functional Limitations Info  Sight, Hearing, Speech Sight Info: Impaired (wears glasses) Hearing Info: Adequate Speech Info: Adequate    SPECIAL CARE FACTORS FREQUENCY  PT (By licensed PT), OT (By licensed OT)     PT Frequency: 5x per week OT Frequency: 5x per week            Contractures Contractures Info: Not present    Additional Factors Info  Code  Status, Allergies Code Status Info: DNR-limnited Allergies Info: NKA           Current Medications (08/30/2023):  This is the current hospital active medication list Current Facility-Administered Medications  Medication Dose Route Frequency Provider Last Rate Last Admin   0.9 %  sodium chloride infusion  250 mL Intravenous PRN Emilie Rutter, PA-C       acetaminophen (TYLENOL) tablet 650 mg  650 mg Oral Q4H PRN Emilie Rutter, PA-C   650 mg at 08/30/23 0841    amoxicillin-clavulanate (AUGMENTIN) 875-125 MG per tablet 1 tablet  1 tablet Oral Q12H Vu, Trung T, MD   1 tablet at 08/30/23 0841   aspirin EC tablet 81 mg  81 mg Oral Daily Emilie Rutter, PA-C   81 mg at 08/30/23 0841   azelastine (ASTELIN) 0.1 % nasal spray 2 spray  2 spray Each Nare BID Emilie Rutter, PA-C   2 spray at 08/29/23 3875   clopidogrel (PLAVIX) tablet 300 mg  300 mg Oral Once Emilie Rutter, PA-C       Followed by   clopidogrel (PLAVIX) tablet 75 mg  75 mg Oral Q breakfast Emilie Rutter, PA-C   75 mg at 08/30/23 6433   cyanocobalamin (VITAMIN B12) tablet 1,000 mcg  1,000 mcg Oral Daily Emilie Rutter, PA-C   1,000 mcg at 08/30/23 0841   doxycycline (VIBRA-TABS) tablet 100 mg  100 mg Oral Q12H Vu, Trung T, MD   100 mg at 08/30/23 0840   enoxaparin (LOVENOX) injection 40 mg  40 mg Subcutaneous Q24H Osvaldo Shipper, MD   40 mg at 08/29/23 2128   glucagon (human recombinant) (GLUCAGEN) injection 1 mg  1 mg Intravenous PRN Amin, Ankit C, MD       guaiFENesin (ROBITUSSIN) 100 MG/5ML liquid 5 mL  5 mL Oral Q4H PRN Amin, Ankit C, MD       hydrALAZINE (APRESOLINE) injection 10 mg  10 mg Intravenous Q4H PRN Amin, Ankit C, MD   10 mg at 08/28/23 2110   ipratropium-albuterol (DUONEB) 0.5-2.5 (3) MG/3ML nebulizer solution 3 mL  3 mL Nebulization Q4H PRN Amin, Ankit C, MD       metoprolol tartrate (LOPRESSOR) injection 5 mg  5 mg Intravenous Q4H PRN Amin, Ankit C, MD       mometasone-formoterol (DULERA) 200-5 MCG/ACT inhaler 2 puff  2 puff Inhalation BID Emilie Rutter, PA-C   2 puff at 08/30/23 0849   morphine (PF) 2 MG/ML injection 2 mg  2 mg Intravenous Q1H PRN Emilie Rutter, PA-C       ondansetron (ZOFRAN) tablet 4 mg  4 mg Oral Q6H PRN Emilie Rutter, PA-C       Or   ondansetron (ZOFRAN) injection 4 mg  4 mg Intravenous Q6H PRN Emilie Rutter, PA-C       oxyCODONE (Oxy IR/ROXICODONE) immediate release tablet 5-10 mg  5-10 mg Oral Q4H PRN Emilie Rutter, PA-C   10  mg at 08/30/23 0840   pantoprazole (PROTONIX) EC tablet 40 mg  40 mg Oral Daily Emilie Rutter, PA-C   40 mg at 08/30/23 0841   polyethylene glycol (MIRALAX / GLYCOLAX) packet 17 g  17 g Oral Daily PRN Emilie Rutter, PA-C       rosuvastatin (CRESTOR) tablet 20 mg  20 mg Oral Daily Silvana Newness, RPH   20 mg at 08/30/23 0840   senna-docusate (Senokot-S) tablet 1 tablet  1 tablet Oral QHS PRN Miguel Rota, MD  sodium chloride flush (NS) 0.9 % injection 3 mL  3 mL Intravenous Q12H Emilie Rutter, PA-C   3 mL at 08/30/23 0842   sodium chloride flush (NS) 0.9 % injection 3 mL  3 mL Intravenous PRN Emilie Rutter, PA-C       traMADol Janean Sark) tablet 25 mg  25 mg Oral Q8H PRN Emilie Rutter, PA-C   25 mg at 08/29/23 0846   traZODone (DESYREL) tablet 50 mg  50 mg Oral QHS PRN Amin, Ankit C, MD   50 mg at 08/29/23 2129   trimethoprim-polymyxin b (POLYTRIM) ophthalmic solution 1 drop  1 drop Both Eyes Q6H Emilie Rutter, PA-C   1 drop at 08/28/23 1631     Discharge Medications: Please see discharge summary for a list of discharge medications.  Relevant Imaging Results:  Relevant Lab Results:   Additional Information SSN [id number]  Eduard Roux, LCSW

## 2023-08-30 NOTE — Progress Notes (Addendum)
[facility] [place]  Subjective  - no new complaints waiting to eat breakfast   Objective (!) 129/55 80 99 F (37.2 C) (Oral) 18 91%  Intake/Output Summary (Last 24 hours) at 08/30/2023 0758 Last data filed at 08/30/2023 0335 Gross per 24 hour  Intake 240 ml  Output 1003 ml  Net -763 ml    Left groin soft healing incision Left PT/DP signals intact Leg incision healing well, no erythema or edema Lungs non labored breathing   Assessment/Planning: POD # 3 1.  Left common femoral endarterectomy 2.  Left common femoral to above-knee popliteal artery bypass with 6 mm ringed PTFE   Left LE well perfused with maintained doppler signals  Incision healing well ASA. Plavix and statin daily for medical management  Stable post op disposition F/U will be arranged with out office  Mosetta Pigeon 08/30/2023 7:58 AM --  Laboratory Lab Results: Recent Labs    08/28/23 0500 08/29/23 0509  WBC 10.1 8.2  HGB 9.4* 8.5*  HCT 28.4* 26.4*  PLT 186 190   BMET Recent Labs    08/28/23 0500 08/29/23 0509  NA 138 140  K 3.9 3.4*  CL 111 109  CO2 22 22  GLUCOSE 170* 116*  BUN 20 20  CREATININE 0.98 1.09  CALCIUM 7.9* 8.2*    COAG Lab Results  Component Value Date   INR 1.3 (H) 05/21/2020   INR 1.2 01/23/2020   INR 1.2 09/15/2019   No results found for: "PTT"  I have independently interviewed and examined patient and agree with PA assessment and plan above.  He is stable from a vascular surgery standpoint.  I have discussed patient's care with his legal guardian and she will be here later today.  Overall he is progressing well from bypass surgery will need consideration of left great toe amputation which he previously refused with podiatry.  Mikeal Winstanley C. Randie Heinz, MD [facility] [place] Office: [phone] Pager: [phone]

## 2023-08-30 NOTE — Discharge Summary (Addendum)
the site of interest.  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  The SFA has a store takeoff and then it is occluded for a long segment reconstituting above the knee popliteal artery with dominant runoff via a large posterior tibial artery. Plan will be for left common femoral endarterectomy with femoral to above-knee popliteal artery bypass.  Procedure:  The patient was identified in the holding area and taken to room 8.  The patient was then placed supine on the table and prepped and draped in the usual sterile fashion.  A time out was called.  Ultrasound was used to evaluate the right  common femoral artery.  This was patulous and patent.  The area was anesthetized 1% lidocaine and cannulated with a micropuncture needle followed by wire and a sheath.  An ultrasound image was saved to the permanent record.  Concomitantly we administered fentanyl and Versed and his vital signs were monitored throughout the case by bedside nursing.  We placed a Bentson wire followed by a 5 French sheath around the catheter at the level of L1 and performed aortogram and then pulled back the catheter and perform pelvic angiography.  I then crossed the bifurcation perform left lower extremity angiography.  With the above findings I elected to intervene on the left external iliac artery stenosis given that there was a large gradient across this and no palpable left common femoral pulse.  I first placed a Rosen wire and then a long 6 French sheath and 5000 units of heparin was administered.  I then primarily stented with an 8 x 40 mm drug-eluting stent postdilated with 7 mm balloon.  There was now no gradient across the stent and a bounding common femoral pulse on the left although with palpable calcification in the artery itself.  We then exchanged for a short 6 French sheath on the right and performed right lower extremity angiography.  The sheath will be pulled in postoperative holding given history of endarterectomy of the right.  He tolerated the procedure without any complication. Contrast: 100cc Brandon C. Randie Heinz, MD [facility] Office: [phone] Pager: [phone]   VAS [place] ABI WITH/WO TBI  Result Date: 08/25/2023  LOWER EXTREMITY DOPPLER STUDY Patient Name:  Raymond Walker.  Date of Exam:   08/23/2023 Medical Rec #: [id number]           Accession #:    [id number] Date of Birth: 1951-09-26            Patient Gender: M Patient Age:   72 years Exam Location:  [facility] Procedure:      VAS [place] ABI WITH/WO TBI Referring Phys: Ovid Curd  --------------------------------------------------------------------------------  Indications: Ulceration/osteomyelitis of left foot. High Risk Factors: Hypertension, hyperlipidemia, current smoker. Other Factors: PAD, Hx of RLE TMA.  Vascular Interventions: RLE fem-pop bypass (09/15/2019), I&D of RLE fem-pop                         bypass site (01/26/2020). Comparison Study: Previous ABI exam on 06/29/2020 Performing Technologist: Ernestene Mention RVT, RDMS  Examination Guidelines: A complete evaluation includes at minimum, Doppler waveform signals and systolic blood pressure reading  the site of interest.  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  The SFA has a store takeoff and then it is occluded for a long segment reconstituting above the knee popliteal artery with dominant runoff via a large posterior tibial artery. Plan will be for left common femoral endarterectomy with femoral to above-knee popliteal artery bypass.  Procedure:  The patient was identified in the holding area and taken to room 8.  The patient was then placed supine on the table and prepped and draped in the usual sterile fashion.  A time out was called.  Ultrasound was used to evaluate the right  common femoral artery.  This was patulous and patent.  The area was anesthetized 1% lidocaine and cannulated with a micropuncture needle followed by wire and a sheath.  An ultrasound image was saved to the permanent record.  Concomitantly we administered fentanyl and Versed and his vital signs were monitored throughout the case by bedside nursing.  We placed a Bentson wire followed by a 5 French sheath around the catheter at the level of L1 and performed aortogram and then pulled back the catheter and perform pelvic angiography.  I then crossed the bifurcation perform left lower extremity angiography.  With the above findings I elected to intervene on the left external iliac artery stenosis given that there was a large gradient across this and no palpable left common femoral pulse.  I first placed a Rosen wire and then a long 6 French sheath and 5000 units of heparin was administered.  I then primarily stented with an 8 x 40 mm drug-eluting stent postdilated with 7 mm balloon.  There was now no gradient across the stent and a bounding common femoral pulse on the left although with palpable calcification in the artery itself.  We then exchanged for a short 6 French sheath on the right and performed right lower extremity angiography.  The sheath will be pulled in postoperative holding given history of endarterectomy of the right.  He tolerated the procedure without any complication. Contrast: 100cc Brandon C. Randie Heinz, MD [facility] Office: [phone] Pager: [phone]   VAS [place] ABI WITH/WO TBI  Result Date: 08/25/2023  LOWER EXTREMITY DOPPLER STUDY Patient Name:  Raymond Walker.  Date of Exam:   08/23/2023 Medical Rec #: [id number]           Accession #:    [id number] Date of Birth: 1951-09-26            Patient Gender: M Patient Age:   72 years Exam Location:  [facility] Procedure:      VAS [place] ABI WITH/WO TBI Referring Phys: Ovid Curd  --------------------------------------------------------------------------------  Indications: Ulceration/osteomyelitis of left foot. High Risk Factors: Hypertension, hyperlipidemia, current smoker. Other Factors: PAD, Hx of RLE TMA.  Vascular Interventions: RLE fem-pop bypass (09/15/2019), I&D of RLE fem-pop                         bypass site (01/26/2020). Comparison Study: Previous ABI exam on 06/29/2020 Performing Technologist: Ernestene Mention RVT, RDMS  Examination Guidelines: A complete evaluation includes at minimum, Doppler waveform signals and systolic blood pressure reading  Status: None   Collection Time: 08/22/23  6:27 PM   Specimen: BLOOD  Result Value Ref Range Status   Specimen Description BLOOD RIGHT ANTECUBITAL  Final   Special Requests   Final    BOTTLES DRAWN  AEROBIC AND ANAEROBIC Blood Culture adequate volume   Culture   Final    NO GROWTH 6 DAYS Performed at [facility], [address] [place], [place] [address]    Report Status 08/28/2023 FINAL  Final  Surgical pcr screen     Status: None   Collection Time: 08/27/23  7:48 AM   Specimen: Nasal Mucosa; Nasal Swab  Result Value Ref Range Status   MRSA, PCR NEGATIVE NEGATIVE Final   Staphylococcus aureus NEGATIVE NEGATIVE Final    Comment: (NOTE) The Xpert SA Assay (FDA approved for NASAL specimens in patients 35 years of age and older), is one component of a comprehensive surveillance program. It is not intended to diagnose infection nor to guide or monitor treatment. Performed at [facility], [address], [place], [place] [address]      Labs:   Basic Metabolic Panel: Recent Labs  Lab 08/23/23 1031 08/26/23 0405 08/27/23 0444 08/28/23 0500 08/29/23 0509  NA 137 137 136 138 140  K 4.0 4.3 3.7 3.9 3.4*  CL 101 104 105 111 109  CO2 24 23 24 22 22   GLUCOSE 182* 67* 125* 170* 116*  BUN 16 19 17 20 20   CREATININE 1.15 1.03 1.02 0.98 1.09  CALCIUM 8.5* 8.5* 8.6* 7.9* 8.2*  MG  --   --   --   --  1.6*  PHOS  --   --   --   --  2.5    CBC: Recent Labs  Lab 08/23/23 1031 08/26/23 0405 08/27/23 0444 08/28/23 0500 08/29/23 0509  WBC 10.2 7.6 6.6 10.1 8.2  HGB 13.1 13.0 12.4* 9.4* 8.5*  HCT 39.1 40.1 38.4* 28.4* 26.4*  MCV 86.5 89.3 86.3 88.5 87.7  PLT 178 182 192 186 190     CBG: Recent Labs  Lab 08/26/23 1452  GLUCAP 96     IMAGING STUDIES PERIPHERAL VASCULAR CATHETERIZATION  Result Date: 08/27/2023 See surgical note for result.  PERIPHERAL VASCULAR CATHETERIZATION  Result Date: 08/26/2023 Images from the original result were not included. Patient name: Raymond Walker. 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 Randie Heinz, MD Procedure Performed: 1.  Ultrasound-guided cannulation right common femoral artery 2.  Catheter aorta and aortogram with pelvic angiography 3.  Selection of the left common femoral artery and left lower extremity angiogram 4.  Right lower extremity angiogram 5.  Stent of left external iliac artery with 8 x 40 mm Cook Zilver postdilated with 7 mm balloon 6.  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  He now has left lower extremity ulceration with severely depressed ABIs and a toe pressure 29 mmHg.  He is indicated for angiography with possible intervention. Findings: The aorta is free of flow-limiting stenosis.  There are right common and external iliac artery stents which are patent and the common femoral artery on the right is patent with an occluded bypass which reconstitutes the popliteal artery with runoff dominant via the posterior tibial artery.  The left side is  the site of interest.  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  The SFA has a store takeoff and then it is occluded for a long segment reconstituting above the knee popliteal artery with dominant runoff via a large posterior tibial artery. Plan will be for left common femoral endarterectomy with femoral to above-knee popliteal artery bypass.  Procedure:  The patient was identified in the holding area and taken to room 8.  The patient was then placed supine on the table and prepped and draped in the usual sterile fashion.  A time out was called.  Ultrasound was used to evaluate the right  common femoral artery.  This was patulous and patent.  The area was anesthetized 1% lidocaine and cannulated with a micropuncture needle followed by wire and a sheath.  An ultrasound image was saved to the permanent record.  Concomitantly we administered fentanyl and Versed and his vital signs were monitored throughout the case by bedside nursing.  We placed a Bentson wire followed by a 5 French sheath around the catheter at the level of L1 and performed aortogram and then pulled back the catheter and perform pelvic angiography.  I then crossed the bifurcation perform left lower extremity angiography.  With the above findings I elected to intervene on the left external iliac artery stenosis given that there was a large gradient across this and no palpable left common femoral pulse.  I first placed a Rosen wire and then a long 6 French sheath and 5000 units of heparin was administered.  I then primarily stented with an 8 x 40 mm drug-eluting stent postdilated with 7 mm balloon.  There was now no gradient across the stent and a bounding common femoral pulse on the left although with palpable calcification in the artery itself.  We then exchanged for a short 6 French sheath on the right and performed right lower extremity angiography.  The sheath will be pulled in postoperative holding given history of endarterectomy of the right.  He tolerated the procedure without any complication. Contrast: 100cc Brandon C. Randie Heinz, MD [facility] Office: [phone] Pager: [phone]   VAS [place] ABI WITH/WO TBI  Result Date: 08/25/2023  LOWER EXTREMITY DOPPLER STUDY Patient Name:  Raymond Walker.  Date of Exam:   08/23/2023 Medical Rec #: [id number]           Accession #:    [id number] Date of Birth: 1951-09-26            Patient Gender: M Patient Age:   72 years Exam Location:  [facility] Procedure:      VAS [place] ABI WITH/WO TBI Referring Phys: Ovid Curd  --------------------------------------------------------------------------------  Indications: Ulceration/osteomyelitis of left foot. High Risk Factors: Hypertension, hyperlipidemia, current smoker. Other Factors: PAD, Hx of RLE TMA.  Vascular Interventions: RLE fem-pop bypass (09/15/2019), I&D of RLE fem-pop                         bypass site (01/26/2020). Comparison Study: Previous ABI exam on 06/29/2020 Performing Technologist: Ernestene Mention RVT, RDMS  Examination Guidelines: A complete evaluation includes at minimum, Doppler waveform signals and systolic blood pressure reading  the site of interest.  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  The SFA has a store takeoff and then it is occluded for a long segment reconstituting above the knee popliteal artery with dominant runoff via a large posterior tibial artery. Plan will be for left common femoral endarterectomy with femoral to above-knee popliteal artery bypass.  Procedure:  The patient was identified in the holding area and taken to room 8.  The patient was then placed supine on the table and prepped and draped in the usual sterile fashion.  A time out was called.  Ultrasound was used to evaluate the right  common femoral artery.  This was patulous and patent.  The area was anesthetized 1% lidocaine and cannulated with a micropuncture needle followed by wire and a sheath.  An ultrasound image was saved to the permanent record.  Concomitantly we administered fentanyl and Versed and his vital signs were monitored throughout the case by bedside nursing.  We placed a Bentson wire followed by a 5 French sheath around the catheter at the level of L1 and performed aortogram and then pulled back the catheter and perform pelvic angiography.  I then crossed the bifurcation perform left lower extremity angiography.  With the above findings I elected to intervene on the left external iliac artery stenosis given that there was a large gradient across this and no palpable left common femoral pulse.  I first placed a Rosen wire and then a long 6 French sheath and 5000 units of heparin was administered.  I then primarily stented with an 8 x 40 mm drug-eluting stent postdilated with 7 mm balloon.  There was now no gradient across the stent and a bounding common femoral pulse on the left although with palpable calcification in the artery itself.  We then exchanged for a short 6 French sheath on the right and performed right lower extremity angiography.  The sheath will be pulled in postoperative holding given history of endarterectomy of the right.  He tolerated the procedure without any complication. Contrast: 100cc Brandon C. Randie Heinz, MD [facility] Office: [phone] Pager: [phone]   VAS [place] ABI WITH/WO TBI  Result Date: 08/25/2023  LOWER EXTREMITY DOPPLER STUDY Patient Name:  Raymond Walker.  Date of Exam:   08/23/2023 Medical Rec #: [id number]           Accession #:    [id number] Date of Birth: 1951-09-26            Patient Gender: M Patient Age:   72 years Exam Location:  [facility] Procedure:      VAS [place] ABI WITH/WO TBI Referring Phys: Ovid Curd  --------------------------------------------------------------------------------  Indications: Ulceration/osteomyelitis of left foot. High Risk Factors: Hypertension, hyperlipidemia, current smoker. Other Factors: PAD, Hx of RLE TMA.  Vascular Interventions: RLE fem-pop bypass (09/15/2019), I&D of RLE fem-pop                         bypass site (01/26/2020). Comparison Study: Previous ABI exam on 06/29/2020 Performing Technologist: Ernestene Mention RVT, RDMS  Examination Guidelines: A complete evaluation includes at minimum, Doppler waveform signals and systolic blood pressure reading  Status: None   Collection Time: 08/22/23  6:27 PM   Specimen: BLOOD  Result Value Ref Range Status   Specimen Description BLOOD RIGHT ANTECUBITAL  Final   Special Requests   Final    BOTTLES DRAWN  AEROBIC AND ANAEROBIC Blood Culture adequate volume   Culture   Final    NO GROWTH 6 DAYS Performed at [facility], [address] [place], [place] [address]    Report Status 08/28/2023 FINAL  Final  Surgical pcr screen     Status: None   Collection Time: 08/27/23  7:48 AM   Specimen: Nasal Mucosa; Nasal Swab  Result Value Ref Range Status   MRSA, PCR NEGATIVE NEGATIVE Final   Staphylococcus aureus NEGATIVE NEGATIVE Final    Comment: (NOTE) The Xpert SA Assay (FDA approved for NASAL specimens in patients 35 years of age and older), is one component of a comprehensive surveillance program. It is not intended to diagnose infection nor to guide or monitor treatment. Performed at [facility], [address], [place], [place] [address]      Labs:   Basic Metabolic Panel: Recent Labs  Lab 08/23/23 1031 08/26/23 0405 08/27/23 0444 08/28/23 0500 08/29/23 0509  NA 137 137 136 138 140  K 4.0 4.3 3.7 3.9 3.4*  CL 101 104 105 111 109  CO2 24 23 24 22 22   GLUCOSE 182* 67* 125* 170* 116*  BUN 16 19 17 20 20   CREATININE 1.15 1.03 1.02 0.98 1.09  CALCIUM 8.5* 8.5* 8.6* 7.9* 8.2*  MG  --   --   --   --  1.6*  PHOS  --   --   --   --  2.5    CBC: Recent Labs  Lab 08/23/23 1031 08/26/23 0405 08/27/23 0444 08/28/23 0500 08/29/23 0509  WBC 10.2 7.6 6.6 10.1 8.2  HGB 13.1 13.0 12.4* 9.4* 8.5*  HCT 39.1 40.1 38.4* 28.4* 26.4*  MCV 86.5 89.3 86.3 88.5 87.7  PLT 178 182 192 186 190     CBG: Recent Labs  Lab 08/26/23 1452  GLUCAP 96     IMAGING STUDIES PERIPHERAL VASCULAR CATHETERIZATION  Result Date: 08/27/2023 See surgical note for result.  PERIPHERAL VASCULAR CATHETERIZATION  Result Date: 08/26/2023 Images from the original result were not included. Patient name: Raymond Walker. 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 Randie Heinz, MD Procedure Performed: 1.  Ultrasound-guided cannulation right common femoral artery 2.  Catheter aorta and aortogram with pelvic angiography 3.  Selection of the left common femoral artery and left lower extremity angiogram 4.  Right lower extremity angiogram 5.  Stent of left external iliac artery with 8 x 40 mm Cook Zilver postdilated with 7 mm balloon 6.  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  He now has left lower extremity ulceration with severely depressed ABIs and a toe pressure 29 mmHg.  He is indicated for angiography with possible intervention. Findings: The aorta is free of flow-limiting stenosis.  There are right common and external iliac artery stents which are patent and the common femoral artery on the right is patent with an occluded bypass which reconstitutes the popliteal artery with runoff dominant via the posterior tibial artery.  The left side is  the site of interest.  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  The SFA has a store takeoff and then it is occluded for a long segment reconstituting above the knee popliteal artery with dominant runoff via a large posterior tibial artery. Plan will be for left common femoral endarterectomy with femoral to above-knee popliteal artery bypass.  Procedure:  The patient was identified in the holding area and taken to room 8.  The patient was then placed supine on the table and prepped and draped in the usual sterile fashion.  A time out was called.  Ultrasound was used to evaluate the right  common femoral artery.  This was patulous and patent.  The area was anesthetized 1% lidocaine and cannulated with a micropuncture needle followed by wire and a sheath.  An ultrasound image was saved to the permanent record.  Concomitantly we administered fentanyl and Versed and his vital signs were monitored throughout the case by bedside nursing.  We placed a Bentson wire followed by a 5 French sheath around the catheter at the level of L1 and performed aortogram and then pulled back the catheter and perform pelvic angiography.  I then crossed the bifurcation perform left lower extremity angiography.  With the above findings I elected to intervene on the left external iliac artery stenosis given that there was a large gradient across this and no palpable left common femoral pulse.  I first placed a Rosen wire and then a long 6 French sheath and 5000 units of heparin was administered.  I then primarily stented with an 8 x 40 mm drug-eluting stent postdilated with 7 mm balloon.  There was now no gradient across the stent and a bounding common femoral pulse on the left although with palpable calcification in the artery itself.  We then exchanged for a short 6 French sheath on the right and performed right lower extremity angiography.  The sheath will be pulled in postoperative holding given history of endarterectomy of the right.  He tolerated the procedure without any complication. Contrast: 100cc Brandon C. Randie Heinz, MD [facility] Office: [phone] Pager: [phone]   VAS [place] ABI WITH/WO TBI  Result Date: 08/25/2023  LOWER EXTREMITY DOPPLER STUDY Patient Name:  Raymond Walker.  Date of Exam:   08/23/2023 Medical Rec #: [id number]           Accession #:    [id number] Date of Birth: 1951-09-26            Patient Gender: M Patient Age:   72 years Exam Location:  [facility] Procedure:      VAS [place] ABI WITH/WO TBI Referring Phys: Ovid Curd  --------------------------------------------------------------------------------  Indications: Ulceration/osteomyelitis of left foot. High Risk Factors: Hypertension, hyperlipidemia, current smoker. Other Factors: PAD, Hx of RLE TMA.  Vascular Interventions: RLE fem-pop bypass (09/15/2019), I&D of RLE fem-pop                         bypass site (01/26/2020). Comparison Study: Previous ABI exam on 06/29/2020 Performing Technologist: Ernestene Mention RVT, RDMS  Examination Guidelines: A complete evaluation includes at minimum, Doppler waveform signals and systolic blood pressure reading

## 2023-08-30 NOTE — Plan of Care (Signed)
  Problem: Education: Goal: Knowledge of General Education information will improve Description: Including pain rating scale, medication(s)/side effects and non-pharmacologic comfort measures Outcome: Progressing   Problem: Clinical Measurements: Goal: Ability to maintain clinical measurements within normal limits will improve Outcome: Progressing Goal: Will remain free from infection Outcome: Progressing Goal: Respiratory complications will improve Outcome: Progressing Goal: Cardiovascular complication will be avoided Outcome: Progressing   Problem: Elimination: Goal: Will not experience complications related to urinary retention Outcome: Progressing

## 2023-08-30 NOTE — TOC Transition Note (Signed)
Transition of Care [facility]) - CM/SW Discharge Note   Patient Details  Name: Raymond Walker. MRN: [id number] Date of Birth: May 29, 1951  Transition [facility] Care [facility]) CM/SW Contact:  Eduard Roux, LCSW Phone Number: 08/30/2023, 1:44 PM   Clinical Narrative:     Patient will Discharge to[facility] Discharge Date: 08/30/2023 Family Notified: niece  Transport By: [facility]   Per MD patient is ready for discharge. RN, patient, and facility notified of discharge. Discharge Summary sent to facility. RN given number for [phone]. Ambulance transport requested for patient.   Clinical Social Worker signing off.  Antony Blackbird, MSW, LCSW Clinical Social Worker     Final next level of care: Assisted Living Barriers to Discharge: Barriers Resolved   Patient Goals and CMS Choice   Choice offered to / list presented to : Patient  Discharge Placement                Patient chooses bed at:  [facility]) Patient to be transferred to facility by: ALF Name of family member notified: niece Patient and family notified of of transfer: 08/30/23  Discharge Plan and Services Additional resources added to the After Visit Summary for   In-house Referral: Clinical Social Work                                   Social Determinants of Health (SDOH) Interventions SDOH Screenings   Food Insecurity: Food Insecurity Present (08/27/2023)  Housing: Low Risk  (08/22/2023)  Transportation Needs: No Transportation Needs (08/22/2023)  Utilities: Not At Risk (08/22/2023)  Alcohol Screen: Low Risk  (09/19/2021)  Depression (PHQ2-9): Low Risk  (07/16/2023)  Financial Resource Strain: High Risk (03/13/2023)  Physical Activity: Unknown (03/13/2023)  Social Connections: Socially Isolated (03/13/2023)  Stress: No Stress Concern Present (03/13/2023)  Tobacco Use: High Risk (08/27/2023)     Readmission Risk Interventions    12/12/2021    2:38 PM  Readmission Risk  Prevention Plan  Transportation Screening Complete  PCP or Specialist Appt within 5-7 Days Complete  Home Care Screening Complete  Medication Review (RN CM) Complete

## 2023-08-30 NOTE — Plan of Care (Signed)
  Problem: Education: Goal: Knowledge of General Education information will improve Description Including pain rating scale, medication(s)/side effects and non-pharmacologic comfort measures Outcome: Progressing   

## 2023-08-30 NOTE — TOC Progression Note (Addendum)
Transition of Care (TOC) - Progression Note  Donn Pierini RN, BSN Transitions of Care Unit 4E- RN Case Manager See Treatment Team for direct phone #   Patient Details  Name: Raymond Walker. MRN: [id number] Date of Birth: 1951-02-21  Transition of Care [facility]) CM/SW Contact  Zenda Alpers, Lenn Sink, RN Phone Number: 08/30/2023, 2:33 PM  Clinical Narrative:    Pt stable for transition back to ALF, see CSW note.  CM Notified by [facility] liaison -following patient with MD office protocol referral prearranged for [facility] needs. Orders placed for HHPT/OT-  [facility] liaison notified that pt returning to ALF- they will follow for [facility] start of care.   No further RNCM needs noted.   1520- received call from [facility] AD- Tammy- she voiced that they prefer to use [facility] for [facility] needs and requesting pt have HHRN/PT orders- explained that pt already had referral from [facility] office to [facility] but she stated that [facility] had contacted [facility] for the [facility] as that was their preferred/contracted [facility] agency for [employer].  Explained to Tammy that CM would have to contact [facility] to confirm they would accept referral and be able to provide services.  Call made to [facility].  1545- Per [facility] liaison- Angie- they are out of nextwork with pt's insurance and can not accept referral- HH referral needs to stay with [facility]. [facility] will contact [employer] and let them know, [facility] will also contact [facility] to let them know that they can service for [facility].    Expected Discharge Plan: Assisted Living Barriers to Discharge: Barriers Resolved  Expected Discharge Plan and Services In-house Referral: Clinical Social Work Discharge Planning Services: CM Consult   Living arrangements for the past 2 months: Assisted Living Facility Expected Discharge Date: 08/30/23               DME Arranged: N/A DME Agency: NA       HH Arranged: PT, OT HH Agency: [facility] ([facility]) Date HH  Agency Contacted: 08/30/23   Representative spoke with at [facility] Agency: Nilsa Nutting   Social Determinants of Health (SDOH) Interventions SDOH Screenings   Food Insecurity: Food Insecurity Present (08/27/2023)  Housing: Low Risk  (08/22/2023)  Transportation Needs: No Transportation Needs (08/22/2023)  Utilities: Not At Risk (08/22/2023)  Alcohol Screen: Low Risk  (09/19/2021)  Depression (PHQ2-9): Low Risk  (07/16/2023)  Financial Resource Strain: High Risk (03/13/2023)  Physical Activity: Unknown (03/13/2023)  Social Connections: Socially Isolated (03/13/2023)  Stress: No Stress Concern Present (03/13/2023)  Tobacco Use: High Risk (08/27/2023)    Readmission Risk Interventions    12/12/2021    2:38 PM  Readmission Risk Prevention Plan  Transportation Screening Complete  PCP or Specialist Appt within 5-7 Days Complete  Home Care Screening Complete  Medication Review (RN CM) Complete

## 2023-08-30 NOTE — Progress Notes (Signed)
OT Cancellation Note  Patient Details Name: Raymond Walker. MRN: [id number] DOB: October 15, 1951   Cancelled Treatment:    Reason Eval/Treat Not Completed: Other (comment).  Attempted skilled OT treatment session.  Pt. Declines stating someone had come in prior and he had already done therapy.  Reviewed there are more than one therapies following him and provided examples of what OT focuses on and what goals we had for him.  Pt. Cont. To decline stating "I dont really see why I need to do any of that I will have all that taken care of where I'm going".  Call bell within reach denies need for anything else.  Pt. Left in good spirits door closed.    Alessandra Bevels Lorraine-COTA/L 08/30/2023, 11:47 AM

## 2023-08-30 NOTE — Progress Notes (Signed)
Physical Therapy Treatment Patient Details Name: Raymond Walker. MRN: [id number] DOB: Sep 29, 1951 Today's Date: 08/30/2023   History of Present Illness Pt is a 72 y/o male  presenting to the ED with complaints of wouund to his left foot with increasing pain and redness.  X-ray of the left foot shows osteomyelitis of the first metatarsal head.  Pt s/p cannulation R common femora, angiograms to sites L /R LE's , and stenting L external iliac artery (9/9) and L common femoral endarterectomy and fem/pop BPG ing.  PMHx: COPD, ED, seizures, HTN    PT Comments  Patient is agreeable to PT session with encouragement. Increased independence this session. Patient is Mod I with bed mobility. Cues for safety and to increase independence with transfers with no hands on assistance required to stand. Patient walked a short distance with post-op shoe in place and emphasis on heel weight bearing. Patient is planning to use a wheelchair as primary means of mobility at this time and reports already owning his own wheelchair at ALF. Recommend to continue PT to maximize independence and decrease caregiver burden.    If plan is discharge home, recommend the following: A little help with walking and/or transfers;A little help with bathing/dressing/bathroom;Direct supervision/assist for medications management;Direct supervision/assist for financial management;Assist for transportation   Can travel by private vehicle        Equipment Recommendations  None recommended by PT    Recommendations for Other Services       Precautions / Restrictions Precautions Precautions: Fall Restrictions Weight Bearing Restrictions: Yes LLE Weight Bearing: Partial weight bearing LLE Partial Weight Bearing Percentage or Pounds: WB heel only     Mobility  Bed Mobility Overal bed mobility: Needs Assistance Bed Mobility: Sit to Supine, Supine to Sit     Supine to sit: Modified independent (Device/Increase time) Sit to supine:  Modified independent (Device/Increase time)        Transfers Overall transfer level: Needs assistance Equipment used: Rolling walker (2 wheels) Transfers: Sit to/from Stand Sit to Stand: Supervision, From elevated surface           General transfer comment: discussed safe ways for hand placement on bed and rolling walker with carry over demonstrated. no physical assistance requried to stand with bed height slightly elevated to simulate home environment    Ambulation/Gait Ambulation/Gait assistance: Contact guard assist Gait Distance (Feet): 2 Feet Assistive device: Rolling walker (2 wheels) Gait Pattern/deviations: Step-to pattern, Decreased stance time - left Gait velocity: decreased     General Gait Details: post-op shoe in place with emphasis on heel weight bearing. no loss of balance with taking a few steps with rolling walker. patient is planning to use wheelchair as primary means of mobility at this time and has a wheelchair in place at ALF   Stairs             Wheelchair Mobility     Tilt Bed    Modified Rankin (Stroke Patients Only)       Balance Overall balance assessment: Needs assistance Sitting-balance support: No upper extremity supported, Feet supported Sitting balance-Leahy Scale: Fair     Standing balance support: Bilateral upper extremity supported, Reliant on assistive device for balance Standing balance-Leahy Scale: Poor Standing balance comment: heavy reliance on rolling walker for support                            Cognition Arousal: Alert Behavior During Therapy: Anxious, WFL for  tasks assessed/performed Overall Cognitive Status: Within Functional Limits for tasks assessed                                 General Comments: patient appears easily frustrated during mobility        Exercises      General Comments General comments (skin integrity, edema, etc.): assistance required for donning shoe,  however patient is able to remove shoe without assistance from sitting position. patient reports he has DME in place at ALF including wheelchair, rolling walker, and shower seat. he also reports he is established with home health PT      Pertinent Vitals/Pain Pain Assessment Pain Assessment: No/denies pain    Home Living                          Prior Function            PT Goals (current goals can now be found in the care plan section) Acute Rehab PT Goals Patient Stated Goal: to return home (to ALF) PT Goal Formulation: With patient Time For Goal Achievement: 09/11/23 Potential to Achieve Goals: Good Progress towards PT goals: Progressing toward goals    Frequency    Min 1X/week      PT Plan      Co-evaluation              AM-PAC PT "6 Clicks" Mobility   Outcome Measure  Help needed turning from your back to your side while in a flat bed without using bedrails?: A Little Help needed moving from lying on your back to sitting on the side of a flat bed without using bedrails?: A Little Help needed moving to and from a bed to a chair (including a wheelchair)?: A Little Help needed standing up from a chair using your arms (e.g., wheelchair or bedside chair)?: A Little Help needed to walk in hospital room?: A Little Help needed climbing 3-5 steps with a railing? : A Lot 6 Click Score: 17    End of Session Equipment Utilized During Treatment:  (orthopedic shoe) Activity Tolerance: Patient tolerated treatment well Patient left: in bed;with call bell/phone within reach;with bed alarm set Nurse Communication: Mobility status PT Visit Diagnosis: Other abnormalities of gait and mobility (R26.89);Difficulty in walking, not elsewhere classified (R26.2);Pain Pain - Right/Left: Left Pain - part of body: Leg     Time: 9562-1308 PT Time Calculation (min) (ACUTE ONLY): 24 min  Charges:    $Therapeutic Activity: 23-37 mins PT General Charges $$ ACUTE PT  VISIT: 1 Visit                     Donna Bernard, PT, MPT    Ina Homes 08/30/2023, 1:23 PM

## 2023-08-31 DIAGNOSIS — J449 Chronic obstructive pulmonary disease, unspecified: Secondary | ICD-10-CM | POA: Diagnosis not present

## 2023-08-31 DIAGNOSIS — I872 Venous insufficiency (chronic) (peripheral): Secondary | ICD-10-CM | POA: Diagnosis not present

## 2023-08-31 DIAGNOSIS — M86172 Other acute osteomyelitis, left ankle and foot: Secondary | ICD-10-CM | POA: Diagnosis not present

## 2023-08-31 DIAGNOSIS — Z48812 Encounter for surgical aftercare following surgery on the circulatory system: Secondary | ICD-10-CM | POA: Diagnosis not present

## 2023-08-31 DIAGNOSIS — I70222 Atherosclerosis of native arteries of extremities with rest pain, left leg: Secondary | ICD-10-CM | POA: Diagnosis not present

## 2023-08-31 DIAGNOSIS — E1151 Type 2 diabetes mellitus with diabetic peripheral angiopathy without gangrene: Secondary | ICD-10-CM | POA: Diagnosis not present

## 2023-08-31 DIAGNOSIS — L97528 Non-pressure chronic ulcer of other part of left foot with other specified severity: Secondary | ICD-10-CM | POA: Diagnosis not present

## 2023-08-31 DIAGNOSIS — E1169 Type 2 diabetes mellitus with other specified complication: Secondary | ICD-10-CM | POA: Diagnosis not present

## 2023-08-31 DIAGNOSIS — M009 Pyogenic arthritis, unspecified: Secondary | ICD-10-CM | POA: Diagnosis not present

## 2023-09-03 DIAGNOSIS — M86172 Other acute osteomyelitis, left ankle and foot: Secondary | ICD-10-CM | POA: Diagnosis not present

## 2023-09-03 DIAGNOSIS — I872 Venous insufficiency (chronic) (peripheral): Secondary | ICD-10-CM | POA: Diagnosis not present

## 2023-09-03 DIAGNOSIS — M009 Pyogenic arthritis, unspecified: Secondary | ICD-10-CM | POA: Diagnosis not present

## 2023-09-03 DIAGNOSIS — E1169 Type 2 diabetes mellitus with other specified complication: Secondary | ICD-10-CM | POA: Diagnosis not present

## 2023-09-03 DIAGNOSIS — Z48812 Encounter for surgical aftercare following surgery on the circulatory system: Secondary | ICD-10-CM | POA: Diagnosis not present

## 2023-09-03 DIAGNOSIS — E1151 Type 2 diabetes mellitus with diabetic peripheral angiopathy without gangrene: Secondary | ICD-10-CM | POA: Diagnosis not present

## 2023-09-03 DIAGNOSIS — I70222 Atherosclerosis of native arteries of extremities with rest pain, left leg: Secondary | ICD-10-CM | POA: Diagnosis not present

## 2023-09-03 DIAGNOSIS — L97528 Non-pressure chronic ulcer of other part of left foot with other specified severity: Secondary | ICD-10-CM | POA: Diagnosis not present

## 2023-09-03 DIAGNOSIS — J449 Chronic obstructive pulmonary disease, unspecified: Secondary | ICD-10-CM | POA: Diagnosis not present

## 2023-09-05 ENCOUNTER — Telehealth: Payer: Self-pay | Admitting: Family Medicine

## 2023-09-05 NOTE — Telephone Encounter (Signed)
May have verbal orders for this thank you

## 2023-09-05 NOTE — Telephone Encounter (Signed)
[facility]) called for verbal order for wound care daily cleaning wound and changing dressing with cobana wrap with honey. Home health nurse 2 weeks one and one 1weel 7, (1) PRN. [phone]

## 2023-09-06 DIAGNOSIS — I872 Venous insufficiency (chronic) (peripheral): Secondary | ICD-10-CM | POA: Diagnosis not present

## 2023-09-06 DIAGNOSIS — E1151 Type 2 diabetes mellitus with diabetic peripheral angiopathy without gangrene: Secondary | ICD-10-CM | POA: Diagnosis not present

## 2023-09-06 DIAGNOSIS — E1169 Type 2 diabetes mellitus with other specified complication: Secondary | ICD-10-CM | POA: Diagnosis not present

## 2023-09-06 DIAGNOSIS — I70222 Atherosclerosis of native arteries of extremities with rest pain, left leg: Secondary | ICD-10-CM | POA: Diagnosis not present

## 2023-09-06 DIAGNOSIS — J449 Chronic obstructive pulmonary disease, unspecified: Secondary | ICD-10-CM | POA: Diagnosis not present

## 2023-09-06 DIAGNOSIS — Z48812 Encounter for surgical aftercare following surgery on the circulatory system: Secondary | ICD-10-CM | POA: Diagnosis not present

## 2023-09-06 DIAGNOSIS — M009 Pyogenic arthritis, unspecified: Secondary | ICD-10-CM | POA: Diagnosis not present

## 2023-09-06 DIAGNOSIS — L97528 Non-pressure chronic ulcer of other part of left foot with other specified severity: Secondary | ICD-10-CM | POA: Diagnosis not present

## 2023-09-06 DIAGNOSIS — M86172 Other acute osteomyelitis, left ankle and foot: Secondary | ICD-10-CM | POA: Diagnosis not present

## 2023-09-09 NOTE — Telephone Encounter (Signed)
Called and spoke to [place] from [facility] to give verbal orders per Dr Gerda Diss

## 2023-09-10 DIAGNOSIS — I872 Venous insufficiency (chronic) (peripheral): Secondary | ICD-10-CM | POA: Diagnosis not present

## 2023-09-10 DIAGNOSIS — M86172 Other acute osteomyelitis, left ankle and foot: Secondary | ICD-10-CM | POA: Diagnosis not present

## 2023-09-10 DIAGNOSIS — L97528 Non-pressure chronic ulcer of other part of left foot with other specified severity: Secondary | ICD-10-CM | POA: Diagnosis not present

## 2023-09-10 DIAGNOSIS — E1151 Type 2 diabetes mellitus with diabetic peripheral angiopathy without gangrene: Secondary | ICD-10-CM | POA: Diagnosis not present

## 2023-09-10 DIAGNOSIS — E1169 Type 2 diabetes mellitus with other specified complication: Secondary | ICD-10-CM | POA: Diagnosis not present

## 2023-09-10 DIAGNOSIS — I70222 Atherosclerosis of native arteries of extremities with rest pain, left leg: Secondary | ICD-10-CM | POA: Diagnosis not present

## 2023-09-10 DIAGNOSIS — J449 Chronic obstructive pulmonary disease, unspecified: Secondary | ICD-10-CM | POA: Diagnosis not present

## 2023-09-10 DIAGNOSIS — Z48812 Encounter for surgical aftercare following surgery on the circulatory system: Secondary | ICD-10-CM | POA: Diagnosis not present

## 2023-09-10 DIAGNOSIS — M009 Pyogenic arthritis, unspecified: Secondary | ICD-10-CM | POA: Diagnosis not present

## 2023-09-11 ENCOUNTER — Encounter: Payer: Self-pay | Admitting: Nurse Practitioner

## 2023-09-11 ENCOUNTER — Ambulatory Visit: Payer: Medicare HMO | Admitting: Nurse Practitioner

## 2023-09-11 VITALS — BP 126/78 | HR 88 | Temp 97.9°F | Ht 71.0 in | Wt 150.2 lb

## 2023-09-11 DIAGNOSIS — R110 Nausea: Secondary | ICD-10-CM

## 2023-09-11 DIAGNOSIS — M86172 Other acute osteomyelitis, left ankle and foot: Secondary | ICD-10-CM

## 2023-09-11 MED ORDER — ONDANSETRON 4 MG PO TBDP: 4.0000 mg | ORAL_TABLET | Freq: Three times a day (TID) | ORAL | 0 refills | Status: DC | PRN

## 2023-09-11 NOTE — Progress Notes (Signed)
Subjective:    Patient ID: Raymond Butt., male    DOB: 04/16/1951, 72 y.o.   MRN: [id number]  HPI Pt is here for hospital follow up from 08/22/2023. Pt states no blood work has been done since he left the hospital and has been put on medication he feels like blood work should be done while on. Pt also has questions about medications and would like to know what he can stop and what they are for, pt has refused to take medication until knowing further information has been given to him. Pt states he has also lost his appetite but has gotten it back today after not taking medication  His aide, Raymond Walker is present during the visit. Currently on Augmentin and Doxycyline for osteomyelitis left great toe. States is causes nausea and affects his appetite.  Patient states his bypass graft on his left leg went well. Unsure why Plavix was prescribed. States that a "foot doctor came in and told him they need to amputate his foot". Did not see him again and no further follow up. Has had toes removed from right foot in the past.  Did not take any of his medications today until he talked to [place] about the reason for them.  Continues to use his Symbicort inhaler which controls his COPD.  Review of Systems  Constitutional:  Negative for fever.  Respiratory:  Negative for cough, chest tightness, shortness of breath and wheezing.   Cardiovascular:  Negative for chest pain and leg swelling.  Gastrointestinal:  Positive for nausea. Negative for abdominal pain and vomiting.  Skin:  Positive for wound.       Objective:   Physical Exam NAD. Alert, oriented. Lungs clear. Heart RRR. Abdomen soft, non tender. Surgical wounds left leg healing well without signs of infection. Left foot: dry scaly skin noted. Deep wound, circular, well defined noted at medial aspect left great toe at the podagra. Yellowish drainage in the wound with clear to yellowish drainage on dressing. Surrounding mild erythema noted. Strong DP pulse  palpated left foot.  Area was cleaned with sterile saline and new bandage applied by CMA.  Today's Vitals   09/11/23 1342  BP: 126/78  Pulse: 88  Temp: 97.9 F (36.6 C)  SpO2: 92%  Weight: 150 lb 3.2 oz (68.1 kg)  Height: 5\' 11"  (1.803 m)   Body mass index is 20.95 kg/m.        Assessment & Plan:   Problem List Items Addressed This Visit       Musculoskeletal and Integument   Osteomyelitis of foot, left, acute (HCC) - Primary   Relevant Orders   Ambulatory referral to Podiatry   Other Visit Diagnoses     Nausea          Urgent referral to local podiatrist for evaluation.  Patient agrees to continue antibiotics as prescribed for his toe infection. Note sent to Dr. Lorin Picket regarding continued use of Plavix. Will get back with his facility. Defers flu vaccine. Meds ordered this encounter  Medications   ondansetron (ZOFRAN-ODT) 4 MG disintegrating tablet    Sig: Take 1 tablet (4 mg total) by mouth every 8 (eight) hours as needed for nausea.    Dispense:  20 tablet    Refill:  0    Order Specific Question:   Supervising Provider    Answer:   Lilyan Punt A [[id number]]   Agrees to try Zofran for nausea while on antibiotics. Also agrees to continue Symbicort, Rosuvastatin and  Pantoprazole since these are his regular meds.  Return if symptoms worsen or fail to improve.

## 2023-09-12 DIAGNOSIS — Z48812 Encounter for surgical aftercare following surgery on the circulatory system: Secondary | ICD-10-CM | POA: Diagnosis not present

## 2023-09-12 DIAGNOSIS — I70222 Atherosclerosis of native arteries of extremities with rest pain, left leg: Secondary | ICD-10-CM | POA: Diagnosis not present

## 2023-09-12 DIAGNOSIS — M86172 Other acute osteomyelitis, left ankle and foot: Secondary | ICD-10-CM | POA: Diagnosis not present

## 2023-09-12 DIAGNOSIS — E1169 Type 2 diabetes mellitus with other specified complication: Secondary | ICD-10-CM | POA: Diagnosis not present

## 2023-09-12 DIAGNOSIS — J449 Chronic obstructive pulmonary disease, unspecified: Secondary | ICD-10-CM | POA: Diagnosis not present

## 2023-09-12 DIAGNOSIS — E1151 Type 2 diabetes mellitus with diabetic peripheral angiopathy without gangrene: Secondary | ICD-10-CM | POA: Diagnosis not present

## 2023-09-12 DIAGNOSIS — L97528 Non-pressure chronic ulcer of other part of left foot with other specified severity: Secondary | ICD-10-CM | POA: Diagnosis not present

## 2023-09-12 DIAGNOSIS — I872 Venous insufficiency (chronic) (peripheral): Secondary | ICD-10-CM | POA: Diagnosis not present

## 2023-09-12 DIAGNOSIS — M009 Pyogenic arthritis, unspecified: Secondary | ICD-10-CM | POA: Diagnosis not present

## 2023-09-16 ENCOUNTER — Ambulatory Visit: Payer: Medicare HMO | Admitting: Podiatry

## 2023-09-17 ENCOUNTER — Encounter (HOSPITAL_COMMUNITY): Payer: Self-pay | Admitting: Vascular Surgery

## 2023-09-18 ENCOUNTER — Telehealth: Payer: Self-pay

## 2023-09-18 ENCOUNTER — Inpatient Hospital Stay: Payer: Medicare HMO | Admitting: Internal Medicine

## 2023-09-18 DIAGNOSIS — J449 Chronic obstructive pulmonary disease, unspecified: Secondary | ICD-10-CM | POA: Diagnosis not present

## 2023-09-18 DIAGNOSIS — Z48812 Encounter for surgical aftercare following surgery on the circulatory system: Secondary | ICD-10-CM | POA: Diagnosis not present

## 2023-09-18 DIAGNOSIS — E1169 Type 2 diabetes mellitus with other specified complication: Secondary | ICD-10-CM | POA: Diagnosis not present

## 2023-09-18 DIAGNOSIS — E559 Vitamin D deficiency, unspecified: Secondary | ICD-10-CM

## 2023-09-18 DIAGNOSIS — I70222 Atherosclerosis of native arteries of extremities with rest pain, left leg: Secondary | ICD-10-CM | POA: Diagnosis not present

## 2023-09-18 DIAGNOSIS — E1151 Type 2 diabetes mellitus with diabetic peripheral angiopathy without gangrene: Secondary | ICD-10-CM | POA: Diagnosis not present

## 2023-09-18 DIAGNOSIS — M009 Pyogenic arthritis, unspecified: Secondary | ICD-10-CM | POA: Diagnosis not present

## 2023-09-18 DIAGNOSIS — D649 Anemia, unspecified: Secondary | ICD-10-CM

## 2023-09-18 DIAGNOSIS — M86172 Other acute osteomyelitis, left ankle and foot: Secondary | ICD-10-CM | POA: Diagnosis not present

## 2023-09-18 DIAGNOSIS — L97528 Non-pressure chronic ulcer of other part of left foot with other specified severity: Secondary | ICD-10-CM | POA: Diagnosis not present

## 2023-09-18 DIAGNOSIS — I872 Venous insufficiency (chronic) (peripheral): Secondary | ICD-10-CM | POA: Diagnosis not present

## 2023-09-18 DIAGNOSIS — N289 Disorder of kidney and ureter, unspecified: Secondary | ICD-10-CM

## 2023-09-18 NOTE — Telephone Encounter (Signed)
CBC, metabolic 7, magnesium, vitamin D-hypomagnesia, renal insufficiency, hypocalcemia, anemia of chronic disease  May go ahead and do his labs anytime within the next several days  I reviewed over his medications his medications are there for reasons to help prevent any blockage of this area as well as infection as well as prevent cholesterol buildup I would recommend he stay on his medicines, I would also recommend that he keep his appointment with me toward the end of October if for any reason we feel this needs to be moved up please let me know thank you

## 2023-09-18 NOTE — Telephone Encounter (Signed)
Nettie Elm from [facility] long term care facility called in today regarding pt not wanting to take his medication and would like Dr Gerda Diss to look over all current medications to see if any could be taken off. Pt also concerned about not having additional labs done since getting out of hospital, Pt seen [place] on 09/11/2023 and was told he will need to wait longer to have labs done and is now wondering if these can be ordered. Please advise.

## 2023-09-19 NOTE — Telephone Encounter (Signed)
See MyChart message

## 2023-09-19 NOTE — Addendum Note (Signed)
Addended by: Elizbeth Squires on: 09/19/2023 11:53 AM   Modules accepted: Orders

## 2023-09-20 ENCOUNTER — Inpatient Hospital Stay (HOSPITAL_COMMUNITY)
Admission: EM | Admit: 2023-09-20 | Discharge: 2023-09-26 | DRG: 475 | Disposition: A | Discharge status: Nursing Home (Includes ICF) | Payer: Medicare HMO | Source: Ambulatory Visit | Attending: Internal Medicine | Admitting: Internal Medicine

## 2023-09-20 ENCOUNTER — Other Ambulatory Visit: Payer: Self-pay

## 2023-09-20 ENCOUNTER — Ambulatory Visit (INDEPENDENT_AMBULATORY_CARE_PROVIDER_SITE_OTHER): Payer: Medicare HMO | Admitting: Podiatry

## 2023-09-20 ENCOUNTER — Encounter (HOSPITAL_COMMUNITY): Payer: Self-pay

## 2023-09-20 VITALS — BP 151/87 | HR 90 | Temp 96.5°F

## 2023-09-20 DIAGNOSIS — K861 Other chronic pancreatitis: Secondary | ICD-10-CM | POA: Diagnosis not present

## 2023-09-20 DIAGNOSIS — E7439 Other disorders of intestinal carbohydrate absorption: Secondary | ICD-10-CM | POA: Diagnosis present

## 2023-09-20 DIAGNOSIS — M86172 Other acute osteomyelitis, left ankle and foot: Secondary | ICD-10-CM | POA: Diagnosis not present

## 2023-09-20 DIAGNOSIS — N200 Calculus of kidney: Secondary | ICD-10-CM | POA: Diagnosis present

## 2023-09-20 DIAGNOSIS — Z8249 Family history of ischemic heart disease and other diseases of the circulatory system: Secondary | ICD-10-CM

## 2023-09-20 DIAGNOSIS — Z8701 Personal history of pneumonia (recurrent): Secondary | ICD-10-CM | POA: Diagnosis not present

## 2023-09-20 DIAGNOSIS — Z89422 Acquired absence of other left toe(s): Secondary | ICD-10-CM | POA: Diagnosis not present

## 2023-09-20 DIAGNOSIS — Z7902 Long term (current) use of antithrombotics/antiplatelets: Secondary | ICD-10-CM

## 2023-09-20 DIAGNOSIS — M869 Osteomyelitis, unspecified: Secondary | ICD-10-CM | POA: Diagnosis present

## 2023-09-20 DIAGNOSIS — M00872 Arthritis due to other bacteria, left ankle and foot: Secondary | ICD-10-CM | POA: Diagnosis not present

## 2023-09-20 DIAGNOSIS — F17210 Nicotine dependence, cigarettes, uncomplicated: Secondary | ICD-10-CM | POA: Diagnosis not present

## 2023-09-20 DIAGNOSIS — Z7951 Long term (current) use of inhaled steroids: Secondary | ICD-10-CM | POA: Diagnosis not present

## 2023-09-20 DIAGNOSIS — J449 Chronic obstructive pulmonary disease, unspecified: Secondary | ICD-10-CM | POA: Diagnosis not present

## 2023-09-20 DIAGNOSIS — Z720 Tobacco use: Secondary | ICD-10-CM | POA: Diagnosis present

## 2023-09-20 DIAGNOSIS — I7090 Unspecified atherosclerosis: Secondary | ICD-10-CM | POA: Diagnosis not present

## 2023-09-20 DIAGNOSIS — Z66 Do not resuscitate: Secondary | ICD-10-CM | POA: Diagnosis not present

## 2023-09-20 DIAGNOSIS — K219 Gastro-esophageal reflux disease without esophagitis: Secondary | ICD-10-CM | POA: Diagnosis present

## 2023-09-20 DIAGNOSIS — M79676 Pain in unspecified toe(s): Secondary | ICD-10-CM | POA: Diagnosis present

## 2023-09-20 DIAGNOSIS — Z7982 Long term (current) use of aspirin: Secondary | ICD-10-CM

## 2023-09-20 DIAGNOSIS — I10 Essential (primary) hypertension: Secondary | ICD-10-CM | POA: Diagnosis present

## 2023-09-20 DIAGNOSIS — I700 Atherosclerosis of aorta: Secondary | ICD-10-CM | POA: Diagnosis present

## 2023-09-20 DIAGNOSIS — Z79899 Other long term (current) drug therapy: Secondary | ICD-10-CM

## 2023-09-20 DIAGNOSIS — Z89412 Acquired absence of left great toe: Secondary | ICD-10-CM | POA: Diagnosis not present

## 2023-09-20 DIAGNOSIS — R569 Unspecified convulsions: Secondary | ICD-10-CM | POA: Diagnosis present

## 2023-09-20 DIAGNOSIS — I2510 Atherosclerotic heart disease of native coronary artery without angina pectoris: Secondary | ICD-10-CM | POA: Diagnosis present

## 2023-09-20 DIAGNOSIS — I739 Peripheral vascular disease, unspecified: Secondary | ICD-10-CM | POA: Diagnosis not present

## 2023-09-20 DIAGNOSIS — Z87442 Personal history of urinary calculi: Secondary | ICD-10-CM | POA: Diagnosis not present

## 2023-09-20 DIAGNOSIS — E1169 Type 2 diabetes mellitus with other specified complication: Secondary | ICD-10-CM | POA: Diagnosis not present

## 2023-09-20 DIAGNOSIS — Z9889 Other specified postprocedural states: Secondary | ICD-10-CM | POA: Diagnosis not present

## 2023-09-20 LAB — CBC WITH DIFFERENTIAL/PLATELET
Abs Immature Granulocytes: 0.04 10*3/uL (ref 0.00–0.07)
Basophils Absolute: 0 10*3/uL (ref 0.0–0.1)
Basophils Relative: 0 %
Eosinophils Absolute: 0 10*3/uL (ref 0.0–0.5)
Eosinophils Relative: 0 %
HCT: 41.8 % (ref 39.0–52.0)
Hemoglobin: 12.9 g/dL — ABNORMAL LOW (ref 13.0–17.0)
Immature Granulocytes: 1 %
Lymphocytes Relative: 10 %
Lymphs Abs: 0.9 10*3/uL (ref 0.7–4.0)
MCH: 28.8 pg (ref 26.0–34.0)
MCHC: 30.9 g/dL (ref 30.0–36.0)
MCV: 93.3 fL (ref 80.0–100.0)
Monocytes Absolute: 0.7 10*3/uL (ref 0.1–1.0)
Monocytes Relative: 8 %
Neutro Abs: 7 10*3/uL (ref 1.7–7.7)
Neutrophils Relative %: 81 %
Platelets: 195 10*3/uL (ref 150–400)
RBC: 4.48 MIL/uL (ref 4.22–5.81)
RDW: 16.2 % — ABNORMAL HIGH (ref 11.5–15.5)
WBC: 8.6 10*3/uL (ref 4.0–10.5)
nRBC: 0 % (ref 0.0–0.2)

## 2023-09-20 LAB — PROTIME-INR
INR: 1.2 (ref 0.8–1.2)
Prothrombin Time: 14.9 s (ref 11.4–15.2)

## 2023-09-20 LAB — COMPREHENSIVE METABOLIC PANEL
ALT: 19 U/L (ref 0–44)
AST: 18 U/L (ref 15–41)
Albumin: 3.8 g/dL (ref 3.5–5.0)
Alkaline Phosphatase: 105 U/L (ref 38–126)
Anion gap: 11 (ref 5–15)
BUN: 21 mg/dL (ref 8–23)
CO2: 27 mmol/L (ref 22–32)
Calcium: 9.6 mg/dL (ref 8.9–10.3)
Chloride: 101 mmol/L (ref 98–111)
Creatinine, Ser: 1.14 mg/dL (ref 0.61–1.24)
GFR, Estimated: >60 mL/min (ref >60)
Glucose, Bld: 142 mg/dL — ABNORMAL HIGH (ref 70–99)
Potassium: 4.6 mmol/L (ref 3.5–5.1)
Sodium: 139 mmol/L (ref 135–145)
Total Bilirubin: 0.7 mg/dL (ref 0.3–1.2)
Total Protein: 8.9 g/dL — ABNORMAL HIGH (ref 6.5–8.1)

## 2023-09-20 LAB — SEDIMENTATION RATE: Sed Rate: 33 mm/h — ABNORMAL HIGH (ref 0–16)

## 2023-09-20 LAB — C-REACTIVE PROTEIN: CRP: 5.2 mg/dL — ABNORMAL HIGH (ref <1.0)

## 2023-09-20 LAB — GLUCOSE, CAPILLARY: Glucose-Capillary: 119 mg/dL — ABNORMAL HIGH (ref 70–99)

## 2023-09-20 LAB — PREALBUMIN: Prealbumin: 19 mg/dL (ref 18–38)

## 2023-09-20 MED ORDER — DOXYCYCLINE HYCLATE 100 MG PO TABS: 100.0000 mg | ORAL_TABLET | Freq: Two times a day (BID) | ORAL | Status: DC

## 2023-09-20 MED ORDER — SODIUM CHLORIDE 0.9 % IV SOLN
1.0000 g | INTRAVENOUS | Status: DC
  Administered 2023-09-20 – 2023-09-21 (×2): 1 g via INTRAVENOUS
  Filled 2023-09-20 (×3): qty 10

## 2023-09-20 MED ORDER — OXYCODONE HCL 5 MG PO TABS
5.0000 mg | ORAL_TABLET | ORAL | Status: DC | PRN
  Administered 2023-09-22 – 2023-09-26 (×6): 5 mg via ORAL
  Filled 2023-09-20 (×6): qty 1

## 2023-09-20 MED ORDER — VANCOMYCIN HCL 1250 MG/250ML IV SOLN
1250.0000 mg | INTRAVENOUS | Status: DC
  Administered 2023-09-21: 1250 mg via INTRAVENOUS
  Filled 2023-09-20 (×2): qty 250

## 2023-09-20 MED ORDER — INSULIN ASPART 100 UNIT/ML IJ SOLN: 0.0000 [IU] | Freq: Three times a day (TID) | INTRAMUSCULAR | Status: DC

## 2023-09-20 MED ORDER — METRONIDAZOLE 500 MG/100ML IV SOLN
500.0000 mg | Freq: Three times a day (TID) | INTRAVENOUS | Status: DC
  Administered 2023-09-20 – 2023-09-22 (×6): 500 mg via INTRAVENOUS
  Filled 2023-09-20 (×6): qty 100

## 2023-09-20 MED ORDER — AZELASTINE HCL 0.1 % NA SOLN
2.0000 | Freq: Two times a day (BID) | NASAL | Status: DC
  Filled 2023-09-20: qty 30

## 2023-09-20 MED ORDER — VANCOMYCIN HCL 1500 MG/300ML IV SOLN
1500.0000 mg | Freq: Once | INTRAVENOUS | Status: AC
  Administered 2023-09-20: 1500 mg via INTRAVENOUS
  Filled 2023-09-20: qty 300

## 2023-09-20 MED ORDER — ONDANSETRON HCL 4 MG/2ML IJ SOLN
4.0000 mg | Freq: Four times a day (QID) | INTRAMUSCULAR | Status: DC | PRN
  Filled 2023-09-20: qty 2

## 2023-09-20 MED ORDER — ROSUVASTATIN CALCIUM 20 MG PO TABS
20.0000 mg | ORAL_TABLET | Freq: Every day | ORAL | Status: DC
  Administered 2023-09-22 – 2023-09-26 (×5): 20 mg via ORAL
  Filled 2023-09-20 (×5): qty 1

## 2023-09-20 MED ORDER — ONDANSETRON HCL 4 MG PO TABS: 4.0000 mg | ORAL_TABLET | Freq: Four times a day (QID) | ORAL | Status: DC | PRN

## 2023-09-20 MED ORDER — MOMETASONE FURO-FORMOTEROL FUM 200-5 MCG/ACT IN AERO
2.0000 | INHALATION_SPRAY | Freq: Two times a day (BID) | RESPIRATORY_TRACT | Status: DC
  Filled 2023-09-20: qty 8.8

## 2023-09-20 MED ORDER — ACETAMINOPHEN 325 MG PO TABS
650.0000 mg | ORAL_TABLET | Freq: Four times a day (QID) | ORAL | Status: DC | PRN
  Administered 2023-09-22 – 2023-09-26 (×4): 650 mg via ORAL
  Filled 2023-09-20 (×4): qty 2

## 2023-09-20 MED ORDER — ACETAMINOPHEN 650 MG RE SUPP: 650.0000 mg | Freq: Four times a day (QID) | RECTAL | Status: DC | PRN

## 2023-09-20 MED ORDER — ENOXAPARIN SODIUM 40 MG/0.4ML IJ SOSY
40.0000 mg | PREFILLED_SYRINGE | INTRAMUSCULAR | Status: DC
  Administered 2023-09-20: 40 mg via SUBCUTANEOUS
  Filled 2023-09-20 (×2): qty 0.4

## 2023-09-20 MED ORDER — PANTOPRAZOLE SODIUM 40 MG PO TBEC
40.0000 mg | DELAYED_RELEASE_TABLET | Freq: Every day | ORAL | Status: DC
  Administered 2023-09-22 – 2023-09-25 (×4): 40 mg via ORAL
  Filled 2023-09-20 (×5): qty 1

## 2023-09-20 NOTE — Consult Note (Signed)
PODIATRY CONSULTATION  NAME Raymond Walker. MRN [id number] DOB [birth year] DOA 09/20/2023   Reason for consult: OM LT foot Chief Complaint  Patient presents with   Toe Pain    Consulting physician: Vivi Barrack, MD; [facility]  History of present illness: 72 y.o. male PMHx PAD, COPD, osteomyelitis of the left foot admitted for worsening infection to the left foot.  Patient seen earlier today in the podiatry clinic and recommended coming to the emergency department for admission and workup.  Progressive deterioration of the left foot wound.  Upon last admission patient had refused amputation but now he is amenable to this plan.  Podiatry consulted for surgical consult and inpatient management of the osteomyelitis  Past Medical History:  Diagnosis Date   Caregiver with fatigue 05/02/2020   COPD (chronic obstructive pulmonary disease) (HCC)    Deformity    equinus and metatarsal   Erectile dysfunction    Erectile dysfunction 05/02/2020   Full dentures    History of kidney stones    History of seizures 05/31/2020   Between ages 39 and 49   Hypertension    Impaired glucose tolerance    Pancreatitis, recurrent    Pneumonia    Thrombocytopenia (HCC) 08/10/2018   Staying in the low 100s will follow closely       Latest Ref Rng & Units 09/20/2023    1:58 PM 08/30/2023    1:00 PM 08/29/2023    5:09 AM  CBC  WBC 4.0 - 10.5 K/uL 8.6  9.9  8.2   Hemoglobin 13.0 - 17.0 g/dL 20.2  9.2  8.5   Hematocrit 39.0 - 52.0 % 41.8  29.1  26.4   Platelets 150 - 400 K/uL 195  234  190        Latest Ref Rng & Units 09/20/2023    1:58 PM 08/30/2023    1:00 PM 08/29/2023    5:09 AM  BMP  Glucose 70 - 99 mg/dL 542  706  237   BUN 8 - 23 mg/dL 21  18  20    Creatinine 0.61 - 1.24 mg/dL 6.28  3.15  1.76   Sodium 135 - 145 mmol/L 139  139  140   Potassium 3.5 - 5.1 mmol/L 4.6  4.5  3.4   Chloride 98 - 111 mmol/L 101  106  109   CO2 22 - 32 mmol/L 27  22  22    Calcium 8.9 - 10.3 mg/dL 9.6  8.5  8.2      LT foot 09/20/2023   Physical Exam: General: The patient is alert and oriented x3 in no acute distress.   Dermatology: See above photo  Vascular:  08/26/2023 Dr. 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 Randie Heinz, MD Procedure Performed: 1.  Ultrasound-guided cannulation right common femoral artery 2.  Catheter aorta and aortogram with pelvic angiography 3.  Selection of the left common femoral artery and left lower extremity angiogram 4.  Right lower extremity angiogram 5.  Stent of left external iliac artery with 8 x 40 mm [place] postdilated with 7 mm balloon 6.  Moderate sedation with fentanyl Versed for 39 minutes Findings: The aorta is free of flow-limiting stenosis.  There are right common and external iliac artery stents which are patent and the common femoral artery on the right is patent with an occluded bypass which reconstitutes the popliteal artery with runoff dominant via the  posterior tibial artery.  The left side is the site of interest.  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  The SFA has a store takeoff and then it is occluded for a long segment reconstituting above the knee popliteal artery with dominant runoff via a large posterior tibial artery.   Neurological: Diminished via light touch  Musculoskeletal Exam: Prior transmetatarsal amputation right foot  MR FOOT LEFT WO CONTRAST 08/23/2023 IMPRESSION: 1. Acute osteomyelitis of the first metatarsal head and base of the great toe proximal phalanx. 2. Small first MTP joint effusion, likely septic arthritis. 3. Bipartite tibial hallux  sesamoid with marrow edema, highly suspicious for osteomyelitis. 4. Mild marrow edema within the fibular hallux sesamoid, nonspecific. 5. Soft tissue wound along the medial aspect of the forefoot adjacent to the first metatarsal head. No organized or drainable fluid collection.   ASSESSMENT/PLAN OF CARE Osteomyelitis/septic arthritis first MTP left foot  -Patient seen at bedside this evening -MRI reviewed personally.  Patient is amenable to surgery at this time.  Discussed partial first ray amputation which I believe is appropriate for the patient.  He is amenable to this plan and states that "if there is anything else that needs to be done while in there get it done".   -Preoperative orders placed.  Partial first ray amputation left.  Any other indicated procedure.  Surgery tentatively planned for tomorrow, 09/21/2023, around noon. NPO after midnight -Continue antibiotics as ordered upon admission -Will follow     Thank you for the consult.  Please contact me directly via secure chat with any questions or concerns.     Felecia Shelling, DPM [facility]  Dr. Felecia Shelling, DPM    [address] [place], [place] [address]                Office [phone]  Fax [phone]

## 2023-09-20 NOTE — Progress Notes (Addendum)
Pharmacy Antibiotic Note  Raymond Walker. is a 72 y.o. male admitted on 09/20/2023 with  a toe infection .  Patient went for a podiatry wound check today with plan for amputation next week. The patient's provider sent him to the ED today for sooner amputation. Patient reports consistently taking doxycycline and Augmentin outpatient (scheduled for 6-week course per ID). Pharmacy has been consulted for vancomycin dosing given concern for MRSA.   Plan: - Give IV vancomycin 1500mg  x1, followed by IV vancomycin 1250mg  q24hrs (eAUC 504 using Scr 1.14, TBW, and Vd 0.72) - Monitor renal function and overall clinical picture  - F/u surgical plans for amputation    Temp (24hrs), Avg:97.3 F (36.3 C), Min:96.5 F (35.8 C), Max:98.1 F (36.7 C)  Recent Labs  Lab 09/20/23 1358  WBC 8.6    CrCl cannot be calculated (Patient's most recent lab result is older than the maximum 21 days allowed.).    No Known Allergies  Antimicrobials this admission: 10/4 ceftriaxone x1 10/4 Flagyl x1 10/4 vancomycin >>  Dose adjustments this admission: N/A  Microbiology results: None   Thank you for allowing pharmacy to be a part of this patient's care.  Cherylin Mylar, PharmD Clinical Pharmacist  10/4/20242:38 PM

## 2023-09-20 NOTE — ED Notes (Signed)
ED TO INPATIENT HANDOFF REPORT  ED Nurse Name and Phone #: Delice Bison, RN  S Name/Age/Gender Raymond Walker. 72 y.o. male Room/Bed: WA25/WA25  Code Status   Code Status: Limited: Do not attempt resuscitation (DNR) -DNR-LIMITED -Do Not Intubate/DNI   Home/SNF/Other Home Patient oriented to: self, place, time, and situation Is this baseline? Yes   Triage Complete: Triage complete  Chief Complaint Osteomyelitis of left foot [facility]) [M86.9]  Triage Note [facility] report ped pt went to podiatric for wound check. Provider send pt to ED admission. Pt is due for surgery next, but provider want pt admitted for surgery sooner. Pt without any complaint.    Allergies No Known Allergies  Level of Care/Admitting Diagnosis ED Disposition     ED Disposition  Admit   Condition  --   Comment  Hospital Area: [facility] [[id number]]  Level of Care: Med-Surg [16]  May admit patient to [facility] or [facility] if equivalent level of care is available:: No  Covid Evaluation: Asymptomatic - no recent exposure (last 10 days) testing not required  Diagnosis: Osteomyelitis of left foot [facility]) [[id number]]  Admitting Physician: Bobette Mo [[id number]]  Attending Physician: Bobette Mo [[id number]]  Certification:: I certify this patient will need inpatient services for at least 2 midnights  Expected Medical Readiness: 09/23/2023          B Medical/Surgery History Past Medical History:  Diagnosis Date   Caregiver with fatigue 05/02/2020   COPD (chronic obstructive pulmonary disease) (HCC)    Deformity    equinus and metatarsal   Erectile dysfunction    Erectile dysfunction 05/02/2020   Full dentures    History of kidney stones    History of seizures 05/31/2020   Between ages 19 and 27   Hypertension    Impaired glucose tolerance    Pancreatitis, recurrent    Pneumonia    Thrombocytopenia (HCC) 08/10/2018   Staying in the low 100s will follow closely   Past Surgical  History:  Procedure Laterality Date   ABDOMINAL AORTOGRAM W/LOWER EXTREMITY Bilateral 08/20/2019   Procedure: ABDOMINAL AORTOGRAM W/LOWER EXTREMITY;  Surgeon: Cephus Shelling, MD;  Location[facility] INVASIVE CV LAB;  Service: Cardiovascular;  Laterality: Bilateral;   ABDOMINAL AORTOGRAM W/LOWER EXTREMITY N/A 08/26/2023   Procedure: ABDOMINAL AORTOGRAM W/LOWER EXTREMITY;  Surgeon: Maeola Harman, MD;  Location: [facility] INVASIVE CV LAB;  Service: Cardiovascular;  Laterality: N/A;   ACHILLES TENDON SURGERY Right 08/26/2020   Procedure: ACHILLES TENDON LENGTHENING;  Surgeon: Edwin Cap, DPM;  Location: [facility] OR;  Service: Podiatry;  Laterality: Right;   AMPUTATION Right 09/15/2019   Procedure: AMPUTATION RIGHT TOES One, Two, And Three;  Surgeon: Maeola Harman, MD;  Location: [facility] OR;  Service: Vascular;  Laterality: Right;   APPENDECTOMY     CATARACT EXTRACTION W/PHACO Right 05/02/2018   Procedure: CATARACT EXTRACTION PHACO AND INTRAOCULAR LENS PLACEMENT (IOC);  Surgeon: Fabio Pierce, MD;  Location: [facility] ORS;  Service: Ophthalmology;  Laterality: Right;  CDE: 11.11   CATARACT EXTRACTION W/PHACO Left 07/04/2018   Procedure: CATARACT EXTRACTION PHACO AND INTRAOCULAR LENS PLACEMENT (IOC);  Surgeon: Fabio Pierce, MD;  Location: [facility] ORS;  Service: Ophthalmology;  Laterality: Left;  CDE: 7.15   CHOLECYSTECTOMY     CYSTOSCOPY W/ URETERAL STENT PLACEMENT Right 11/22/2021   Procedure: CYSTOSCOPY WITH RETROGRADE PYELOGRAM/URETERAL STENT PLACEMENT;  Surgeon: Marcine Matar, MD;  Location: [facility] ORS;  Service: Urology;  Laterality: Right;   CYSTOSCOPY WITH RETROGRADE PYELOGRAM, URETEROSCOPY AND STENT  PLACEMENT Right 02/01/2022   Procedure: CYSTOSCOPY WITH RETROGRADE PYELOGRAM, URETEROSCOPY AND STENT PLACEMENT;  Surgeon: Malen Gauze, MD;  Location: [facility] ORS;  Service: Urology;  Laterality: Right;   ENDARTERECTOMY FEMORAL Left 08/27/2023   Procedure: RIGHT FEMORAL ENDARTERECTOMY;  Surgeon: Maeola Harman, MD;  Location: [facility] OR;  Service: Vascular;  Laterality: Left;   FEMORAL-POPLITEAL BYPASS GRAFT Right 09/15/2019   Procedure: Right BYPASS GRAFT FEMORAL to Above Knee POPLITEAL ARTERY;  Surgeon: Maeola Harman, MD;  Location: [facility] OR;  Service: Vascular;  Laterality: Right;   FEMORAL-POPLITEAL BYPASS GRAFT Right 01/26/2020   Procedure: IRRIGATION AND DEBRIDEMENT RIGHT FEMORAL POPLITEAL BYPASS SITE;  Surgeon: Maeola Harman, MD;  Location: [facility] OR;  Service: Vascular;  Laterality: Right;   FEMORAL-POPLITEAL BYPASS GRAFT Left 08/27/2023   Procedure: BYPASS GRAFT LEFT COMMON FEMORAL-POPLITEAL ARTERY;  Surgeon: Maeola Harman, MD;  Location: [facility] OR;  Service: Vascular;  Laterality: Left;   HOLMIUM LASER APPLICATION Right 02/01/2022   Procedure: HOLMIUM LASER APPLICATION;  Surgeon: Malen Gauze, MD;  Location: [facility];  Service: Urology;  Laterality: Right;   MULTIPLE TOOTH EXTRACTIONS     ORCHIECTOMY     PERIPHERAL VASCULAR INTERVENTION Left 08/20/2019   Procedure: PERIPHERAL VASCULAR INTERVENTION;  Surgeon: Cephus Shelling, MD;  Location: [facility] INVASIVE CV LAB;  Service: Cardiovascular;  Laterality: Left;  common/external iliac   PERIPHERAL VASCULAR INTERVENTION Right 08/26/2023   Procedure: PERIPHERAL VASCULAR INTERVENTION;  Surgeon: Maeola Harman, MD;  Location: [facility] INVASIVE CV LAB;  Service: Cardiovascular;  Laterality: Right;  External Illiac   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   Procedure: TRANSMETATARSAL AMPUTATION;  Surgeon: Maeola Harman, MD;  Location: [facility] OR;  Service: Vascular;  Laterality: Right;   TRANSMETATARSAL AMPUTATION Right 08/26/2020   Procedure: TRANSMETATARSAL AMPUTATION REVISION;  Surgeon: Edwin Cap, DPM;  Location: [facility] OR;  Service: Podiatry;  Laterality: Right;   VASECTOMY     WOUND DEBRIDEMENT Right 08/26/2020   Procedure: EXCISION WOUND;  Surgeon: Edwin Cap, DPM;  Location: [facility] OR;   Service: Podiatry;  Laterality: Right;     A IV Location/Drains/Wounds Patient Lines/Drains/Airways Status     Active Line/Drains/Airways     Name Placement date Placement time Site Days   Peripheral IV 09/20/23 20 G 1.88" Anterior;Left;Upper Arm 09/20/23  1409  Arm  less than 1   Ureteral Drain/Stent Right ureter 6 Fr. 02/01/22  0829  Right ureter  596            Intake/Output Last 24 hours No intake or output data in the 24 hours ending 09/20/23 1433  Labs/Imaging Results for orders placed or performed during the hospital encounter of 09/20/23 (from the past 48 hour(s))  CBC with Differential     Status: Abnormal   Collection Time: 09/20/23  1:58 PM  Result Value Ref Range   WBC 8.6 4.0 - 10.5 K/uL   RBC 4.48 4.22 - 5.81 MIL/uL   Hemoglobin 12.9 (L) 13.0 - 17.0 g/dL   HCT 54.0 98.1 - 19.1 %   MCV 93.3 80.0 - 100.0 fL   MCH 28.8 26.0 - 34.0 pg   MCHC 30.9 30.0 - 36.0 g/dL   RDW 47.8 (H) 29.5 - 62.1 %   Platelets 195 150 - 400 K/uL   nRBC 0.0 0.0 - 0.2 %   Neutrophils Relative % 81 %   Neutro Abs 7.0 1.7 - 7.7 K/uL   Lymphocytes Relative 10 %   Lymphs Abs 0.9 0.7 - 4.0  K/uL   Monocytes Relative 8 %   Monocytes Absolute 0.7 0.1 - 1.0 K/uL   Eosinophils Relative 0 %   Eosinophils Absolute 0.0 0.0 - 0.5 K/uL   Basophils Relative 0 %   Basophils Absolute 0.0 0.0 - 0.1 K/uL   Immature Granulocytes 1 %   Abs Immature Granulocytes 0.04 0.00 - 0.07 K/uL    Comment: Performed at [facility], [address] [place], [place] [address]  Comprehensive metabolic panel     Status: Abnormal   Collection Time: 09/20/23  1:58 PM  Result Value Ref Range   Sodium 139 135 - 145 mmol/L   Potassium 4.6 3.5 - 5.1 mmol/L   Chloride 101 98 - 111 mmol/L   CO2 27 22 - 32 mmol/L   Glucose, Bld 142 (H) 70 - 99 mg/dL    Comment: Glucose reference range applies only to samples taken after fasting for at least 8 hours.   BUN 21 8 - 23 mg/dL   Creatinine, Ser 2.84 0.61  - 1.24 mg/dL   Calcium 9.6 8.9 - 13.2 mg/dL   Total Protein 8.9 (H) 6.5 - 8.1 g/dL   Albumin 3.8 3.5 - 5.0 g/dL   AST 18 15 - 41 U/L   ALT 19 0 - 44 U/L   Alkaline Phosphatase 105 38 - 126 U/L   Total Bilirubin 0.7 0.3 - 1.2 mg/dL   GFR, Estimated >44 >01 mL/min    Comment: (NOTE) Calculated using the CKD-EPI Creatinine Equation (2021)    Anion gap 11 5 - 15    Comment: Performed at [facility], [address] [place], [place] [address]  Protime-INR     Status: None   Collection Time: 09/20/23  1:58 PM  Result Value Ref Range   Prothrombin Time 14.9 11.4 - 15.2 seconds   INR 1.2 0.8 - 1.2    Comment: (NOTE) INR goal varies based on device and disease states. Performed at [facility], [address] [place], [place] [address]    No results found.  Pending Labs Unresulted Labs (From admission, onward)     Start     Ordered   09/21/23 0500  CBC  Tomorrow morning,   R        09/20/23 1421   09/21/23 0500  Comprehensive metabolic panel  Tomorrow morning,   R        09/20/23 1421   09/20/23 1415  Sedimentation rate  Add-on,   AD        09/20/23 1415   09/20/23 1415  C-reactive protein  Add-on,   AD        09/20/23 1415   09/20/23 1415  Prealbumin  Add-on,   AD        09/20/23 1415            Vitals/Pain Today's Vitals   09/20/23 1305 09/20/23 1306  BP: 114/87   Pulse: 82   Resp: 16   Temp: 98.1 F (36.7 C)   SpO2: 96% 96%  PainSc:  0-No pain    Isolation Precautions No active isolations  Medications Medications  cefTRIAXone (ROCEPHIN) 1 g in sodium chloride 0.9 % 100 mL IVPB (has no administration in time range)  metroNIDAZOLE (FLAGYL) IVPB 500 mg (has no administration in time range)  enoxaparin (LOVENOX) injection 40 mg (has no administration in time range)  acetaminophen (TYLENOL) tablet 650 mg (has no administration in time range)    Or  acetaminophen (  TYLENOL) suppository 650 mg (has no administration in  time range)  ondansetron (ZOFRAN) tablet 4 mg (has no administration in time range)    Or  ondansetron (ZOFRAN) injection 4 mg (has no administration in time range)  oxyCODONE (Oxy IR/ROXICODONE) immediate release tablet 5 mg (has no administration in time range)    Mobility walks     Focused Assessments Cardiac Assessment Handoff:    Lab Results  Component Value Date   CKTOTAL 502 (H) 01/23/2020   CKMB 3.0 11/14/2011   TROPONINI <0.03 04/29/2015   No results found for: "DDIMER" Does the Patient currently have chest pain? No    R Recommendations: See Admitting Provider Note  Report given to:   Additional Notes:

## 2023-09-20 NOTE — Plan of Care (Signed)
  Problem: Activity: Goal: Ability to return to baseline activity level will improve Outcome: Progressing   Problem: Health Behavior/Discharge Planning: Goal: Ability to safely manage health-related needs after discharge will improve Outcome: Progressing   Problem: Clinical Measurements: Goal: Ability to maintain clinical measurements within normal limits will improve Outcome: Progressing   Problem: Education: Goal: Understanding of CV disease, CV risk reduction, and recovery process will improve Outcome: Not Progressing   Problem: Cardiovascular: Goal: Ability to achieve and maintain adequate cardiovascular perfusion will improve Outcome: Not Progressing   Problem: Health Behavior/Discharge Planning: Goal: Ability to manage health-related needs will improve Outcome: Not Progressing

## 2023-09-20 NOTE — ED Triage Notes (Signed)
[facility] report ped pt went to podiatric for wound check. Provider send pt to ED admission. Pt is due for surgery next, but provider want pt admitted for surgery sooner. Pt without any complaint.

## 2023-09-20 NOTE — H&P (Signed)
History and Physical    Patient: Raymond Walker. WUJ:[id number] DOB: 09/29/1951 DOA: 09/20/2023 DOS: the patient was seen and examined on 09/20/2023 PCP: Babs Sciara, MD  Patient coming from: Home  Chief Complaint:  Chief Complaint  Patient presents with   Toe Pain   HPI: Raymond Walker. is a 72 y.o. male with medical history significant of caregiver fatigue, COPD, equinus and metatarsal deformity, ED, nephrolithiasis, childhood seizures, hypertension, glucose intolerance, recurrent pancreatitis, history of pneumonia, thrombocytopenia, tobacco abuse, peripheral arterial disease in remission according to the patient who was referred by podiatry due to progressive worsening of his left foot wound which will require surgical amputation despite undergoing external iliac artery stenting initially with 90% stenosis and 0% residual with a palpable femoral pulse after procedure with vascular surgery 25 days ago. He denied fever, chills, rhinorrhea, sore throat, wheezing or hemoptysis.  No chest pain, palpitations, diaphoresis, PND, orthopnea or pitting edema of the lower extremities.  No abdominal pain, nausea, emesis, diarrhea, constipation, melena or hematochezia.  No flank pain, dysuria, frequency or hematuria.  No polyuria, polydipsia, polyphagia or blurred vision.   ED course: Initial vital signs were temperature 98.1 F, pulse 82, respirations 16, BP 114/87 mmHg O2 sat 96% on room air.   Lab work: CBC showed a white count of 8.6, hemoglobin 12.9 g/dL and platelets 956.  PT and INR were normal.  CMP showed a glucose of 142 mg/dL and total protein of 8.9 g/dL.  The rest of the CMP measurements were normal.  Imaging: MRI of left foot: 26 days ago showed acute osteomyelitis of the first metatarsal head and base of the great toe proximal phalanx.  Review of Systems: As mentioned in the history of present illness. All other systems reviewed and are negative. Past Medical History:  Diagnosis  Date   Caregiver with fatigue 05/02/2020   COPD (chronic obstructive pulmonary disease) (HCC)    Deformity    equinus and metatarsal   Erectile dysfunction    Erectile dysfunction 05/02/2020   Full dentures    History of kidney stones    History of seizures 05/31/2020   Between ages 11 and 79   Hypertension    Impaired glucose tolerance    Pancreatitis, recurrent    Pneumonia    Thrombocytopenia (HCC) 08/10/2018   Staying in the low 100s will follow closely   Past Surgical History:  Procedure Laterality Date   ABDOMINAL AORTOGRAM W/LOWER EXTREMITY Bilateral 08/20/2019   Procedure: ABDOMINAL AORTOGRAM W/LOWER EXTREMITY;  Surgeon: Cephus Shelling, MD;  Location: [facility] INVASIVE CV LAB;  Service: Cardiovascular;  Laterality: Bilateral;   ABDOMINAL AORTOGRAM W/LOWER EXTREMITY N/A 08/26/2023   Procedure: ABDOMINAL AORTOGRAM W/LOWER EXTREMITY;  Surgeon: Maeola Harman, MD;  Location: [facility] INVASIVE CV LAB;  Service: Cardiovascular;  Laterality: N/A;   ACHILLES TENDON SURGERY Right 08/26/2020   Procedure: ACHILLES TENDON LENGTHENING;  Surgeon: Edwin Cap, DPM;  Location: [facility] OR;  Service: Podiatry;  Laterality: Right;   AMPUTATION Right 09/15/2019   Procedure: AMPUTATION RIGHT TOES One, Two, And Three;  Surgeon: Maeola Harman, MD;  Location: [facility] OR;  Service: Vascular;  Laterality: Right;   APPENDECTOMY     CATARACT EXTRACTION W/PHACO Right 05/02/2018   Procedure: CATARACT EXTRACTION PHACO AND INTRAOCULAR LENS PLACEMENT (IOC);  Surgeon: Fabio Pierce, MD;  Location: [facility] ORS;  Service: Ophthalmology;  Laterality: Right;  CDE: 11.11   CATARACT EXTRACTION W/PHACO Left 07/04/2018   Procedure: CATARACT EXTRACTION PHACO AND INTRAOCULAR LENS  PLACEMENT (IOC);  Surgeon: Fabio Pierce, MD;  Location: [facility] ORS;  Service: Ophthalmology;  Laterality: Left;  CDE: 7.15   CHOLECYSTECTOMY     CYSTOSCOPY W/ URETERAL STENT PLACEMENT Right 11/22/2021   Procedure: CYSTOSCOPY WITH RETROGRADE  PYELOGRAM/URETERAL STENT PLACEMENT;  Surgeon: Marcine Matar, MD;  Location: [facility] ORS;  Service: Urology;  Laterality: Right;   CYSTOSCOPY WITH RETROGRADE PYELOGRAM, URETEROSCOPY AND STENT PLACEMENT Right 02/01/2022   Procedure: CYSTOSCOPY WITH RETROGRADE PYELOGRAM, URETEROSCOPY AND STENT PLACEMENT;  Surgeon: Malen Gauze, MD;  Location: [facility] ORS;  Service: Urology;  Laterality: Right;   ENDARTERECTOMY FEMORAL Left 08/27/2023   Procedure: RIGHT FEMORAL ENDARTERECTOMY;  Surgeon: Maeola Harman, MD;  Location: [facility] OR;  Service: Vascular;  Laterality: Left;   FEMORAL-POPLITEAL BYPASS GRAFT Right 09/15/2019   Procedure: Right BYPASS GRAFT FEMORAL to Above Knee POPLITEAL ARTERY;  Surgeon: Maeola Harman, MD;  Location: [facility] OR;  Service: Vascular;  Laterality: Right;   FEMORAL-POPLITEAL BYPASS GRAFT Right 01/26/2020   Procedure: IRRIGATION AND DEBRIDEMENT RIGHT FEMORAL POPLITEAL BYPASS SITE;  Surgeon: Maeola Harman, MD;  Location: [facility] OR;  Service: Vascular;  Laterality: Right;   FEMORAL-POPLITEAL BYPASS GRAFT Left 08/27/2023   Procedure: BYPASS GRAFT LEFT COMMON FEMORAL-POPLITEAL ARTERY;  Surgeon: Maeola Harman, MD;  Location: [facility] OR;  Service: Vascular;  Laterality: Left;   HOLMIUM LASER APPLICATION Right 02/01/2022   Procedure: HOLMIUM LASER APPLICATION;  Surgeon: Malen Gauze, MD;  Location: [facility] ORS;  Service: Urology;  Laterality: Right;   MULTIPLE TOOTH EXTRACTIONS     ORCHIECTOMY     PERIPHERAL VASCULAR INTERVENTION Left 08/20/2019   Procedure: PERIPHERAL VASCULAR INTERVENTION;  Surgeon: Cephus Shelling, MD;  Location: [facility] INVASIVE CV LAB;  Service: Cardiovascular;  Laterality: Left;  common/external iliac   PERIPHERAL VASCULAR INTERVENTION Right 08/26/2023   Procedure: PERIPHERAL VASCULAR INTERVENTION;  Surgeon: Maeola Harman, MD;  Location: [facility] INVASIVE CV LAB;  Service: Cardiovascular;  Laterality: Right;  External Illiac    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   Procedure: TRANSMETATARSAL AMPUTATION;  Surgeon: Maeola Harman, MD;  Location: [facility] OR;  Service: Vascular;  Laterality: Right;   TRANSMETATARSAL AMPUTATION Right 08/26/2020   Procedure: TRANSMETATARSAL AMPUTATION REVISION;  Surgeon: Edwin Cap, DPM;  Location: [facility] OR;  Service: Podiatry;  Laterality: Right;   VASECTOMY     WOUND DEBRIDEMENT Right 08/26/2020   Procedure: EXCISION WOUND;  Surgeon: Edwin Cap, DPM;  Location: [facility] OR;  Service: Podiatry;  Laterality: Right;   Social History:  reports that he has been smoking cigarettes. He has a 30 pack-year smoking history. He has quit using smokeless tobacco.  His smokeless tobacco use included chew. He reports that he does not drink alcohol and does not use drugs.  No Known Allergies  Family History  Problem Relation Age of Onset   Hypertension Father    Coronary artery disease Father    Coronary artery disease Mother    Cancer Mother        Breast    Prior to Admission medications   Medication Sig Start Date End Date Taking? Authorizing Provider  albuterol (VENTOLIN HFA) 108 (90 Base) MCG/ACT inhaler INHALE 2 PUFFS INTO THE LUNGS EVERY 4 HOURS AS NEEDED FOR WHEEZING OR SHORTNESS OF BREATH 08/09/21  Yes Babs Sciara, MD  amoxicillin-clavulanate (AUGMENTIN) 875-125 MG tablet Take 1 tablet by mouth every 12 (twelve) hours. 08/30/23 10/09/23 Yes Osvaldo Shipper, MD  aspirin EC 81 MG tablet Take 1 tablet (81 mg total) by mouth daily.  Swallow whole. 08/31/23  Yes Osvaldo Shipper, MD  azelastine (ASTELIN) 0.1 % nasal spray Place 2 sprays into both nostrils 2 (two) times daily. 07/16/23  Yes Babs Sciara, MD  budesonide-formoterol (SYMBICORT) 160-4.5 MCG/ACT inhaler Inhale 2 puffs into the lungs 2 (two) times daily. 07/16/23  Yes Babs Sciara, MD  clopidogrel (PLAVIX) 75 MG tablet Take 1 tablet (75 mg total) by mouth daily. 08/30/23  Yes Osvaldo Shipper, MD  cyanocobalamin 1000  MCG tablet Take 1 tablet (1,000 mcg total) by mouth daily. 08/31/23  Yes Osvaldo Shipper, MD  doxycycline (VIBRA-TABS) 100 MG tablet Take 1 tablet (100 mg total) by mouth every 12 (twelve) hours. 08/30/23 10/09/23 Yes Osvaldo Shipper, MD  ondansetron (ZOFRAN-ODT) 4 MG disintegrating tablet Take 1 tablet (4 mg total) by mouth every 8 (eight) hours as needed for nausea. 09/11/23  Yes Campbell Riches, NP  oxyCODONE (OXY IR/ROXICODONE) 5 MG immediate release tablet Take 1 tablet (5 mg total) by mouth every 6 (six) hours as needed for severe pain. 08/30/23  Yes Osvaldo Shipper, MD  pantoprazole (PROTONIX) 40 MG tablet TAKE (1) TABLET BY MOUTH ONCE DAILY. Patient taking differently: Take 40 mg by mouth daily. TAKE (1) TABLET BY MOUTH ONCE DAILY. 03/14/23  Yes Babs Sciara, MD  rosuvastatin (CRESTOR) 20 MG tablet Take 1 tablet (20 mg total) by mouth daily. 08/31/23  Yes Osvaldo Shipper, MD    Physical Exam: Vitals:   09/20/23 1306 09/20/23 1432 09/20/23 1552 09/20/23 1631  BP:  113/88 (!) 129/94 113/88  Pulse:  78 60 77  Resp:  18 20 20   Temp:  97.7 F (36.5 C) (!) 97.5 F (36.4 C) 98.2 F (36.8 C)  TempSrc:  Oral Oral Oral  SpO2: 96% 98% 100% 97%   Physical Exam Vitals reviewed.  Constitutional:      General: He is awake. He is not in acute distress.    Appearance: He is normal weight.  HENT:     Head: Normocephalic.     Nose: No rhinorrhea.     Mouth/Throat:     Mouth: Mucous membranes are moist.  Eyes:     General: No scleral icterus.    Pupils: Pupils are equal, round, and reactive to light.  Neck:     Vascular: No JVD.  Cardiovascular:     Rate and Rhythm: Normal rate and regular rhythm.     Heart sounds: S1 normal and S2 normal.  Pulmonary:     Effort: Pulmonary effort is normal.     Breath sounds: Normal breath sounds. No wheezing, rhonchi or rales.  Abdominal:     General: Bowel sounds are normal. There is no distension.     Palpations: Abdomen is soft.     Tenderness:  There is no abdominal tenderness. There is no right CVA tenderness or left CVA tenderness.  Musculoskeletal:     Cervical back: Neck supple.     Right lower leg: No edema.     Left lower leg: No edema.  Skin:    General: Skin is warm and dry.  Neurological:     General: No focal deficit present.     Mental Status: He is alert and oriented to person, place, and time.  Psychiatric:        Mood and Affect: Mood normal.        Behavior: Behavior normal. Behavior is cooperative.    Data Reviewed:  Results are pending, will review when available.  Assessment and Plan: Principal Problem:  Osteomyelitis of left foot (HCC) In the setting of:   PAD (peripheral artery disease) (HCC) Admit to MedSurg/inpatient. Analgesics as needed. Begin ceftriaxone 1 g IVPB every 24 hours.   Begin metronidazole 500 mg IVPB q 12 hr. Begin vancomycin per pharmacy. For surgery with podiatry tomorrow. Follow CBC and CMP in a.m. Hold aspirin and clopidogrel. Continue statin.  Active Problems:   Tobacco abuse In Remission.    Aortic atherosclerosis (HCC) On rosuvastatin.    Coronary artery calcification seen on CAT scan Resume DAPT once cleared by podiatry. Continue rosuvastatin 20 mg p.o. daily.    COPD (chronic obstructive pulmonary disease) (HCC) Continue Symbicort or formulary equivalent.    Essential hypertension Not on antihypertensives.    GERD (gastroesophageal reflux disease) Continue pantoprazole 40 mg p.o.daily.     Advance Care Planning:   Code Status: Limited: Do not attempt resuscitation (DNR) -DNR-LIMITED -Do Not Intubate/DNI    Consults:   Family Communication:   Severity of Illness: The appropriate patient status for this patient is INPATIENT. Inpatient status is judged to be reasonable and necessary in order to provide the required intensity of service to ensure the patient's safety. The patient's presenting symptoms, physical exam findings, and initial radiographic  and laboratory data in the context of their chronic comorbidities is felt to place them at high risk for further clinical deterioration. Furthermore, it is not anticipated that the patient will be medically stable for discharge from the hospital within 2 midnights of admission.   * I certify that at the point of admission it is my clinical judgment that the patient will require inpatient hospital care spanning beyond 2 midnights from the point of admission due to high intensity of service, high risk for further deterioration and high frequency of surveillance required.*  Author: Bobette Mo, MD 09/20/2023 5:54 PM  For on call review [email or web address].   This document was prepared using Dragon voice recognition software and may contain some unintended transcription errors.

## 2023-09-20 NOTE — Progress Notes (Signed)
Subjective:  Patient ID: Raymond Walker., male    DOB: 09/03/1951,  MRN: [id number]  Chief Complaint  Patient presents with   Wound Check    Acute osteomyelitis of right foot.     72 y.o. male presents with concern for osteomyelitis of the left forefoot.  Patient was previously admitted with concern for osteomyelitis in the first metatarsal head.  Was seen by Dr. Ralene Cork on August 28, 2023.  Unfortunately at that time he refused surgical treatment however he returns today for further evaluation after being seen by his primary care doctor and referred to our office.  Unclear if primary care office was aware of his recent admission as well as the patient's refusal of the recommended surgical intervention of first ray amputation at that time as they placed an urgent referral back to our office.  Per scheduling note patient was not able to be seen on September 27 and therefore he is following up with me today.  Does not appear that patient followed up in our office after his discharge on September 13 for unknown reasons.  Patient does state that he has been nauseous recently he thinks possibly due to medication but is unsure.  Also he notes he is felt weak and unusually fatigued over the past couple days more so than previously  Past Medical History:  Diagnosis Date   Caregiver with fatigue 05/02/2020   COPD (chronic obstructive pulmonary disease) (HCC)    Deformity    equinus and metatarsal   Erectile dysfunction    Erectile dysfunction 05/02/2020   Full dentures    History of kidney stones    History of seizures 05/31/2020   Between ages 56 and 44   Hypertension    Impaired glucose tolerance    Pancreatitis, recurrent    Pneumonia    Thrombocytopenia (HCC) 08/10/2018   Staying in the low 100s will follow closely    No Known Allergies  ROS: Negative except as per HPI above  Objective:  General: AAO x3, NAD  Dermatological: Ulceration present at the medial aspect of the first  metatarsal head of the left foot.  There is erythema surrounding the area.  Fibrotic and seropurulent drainage from the wound bed.  Vascular:  Dorsalis Pedis artery and Posterior Tibial artery pedal pulses are 2/4 bilateral.  Capillary fill time < 3 sec to all digits.   Neruologic: Grossly absent via light touch to the distal foot  Musculoskeletal: No gross boney pedal deformities bilateral. No pain, crepitus, or limitation noted with foot and ankle range of motion bilateral. Muscular strength 5/5 in all groups tested bilateral.  Gait: Unassisted, Nonantalgic.   No images are attached to the encounter.  Radiographs:  MRI August 23, 2023 with concern for acute osteomyelitis of the first metatarsal head and base of the hallux proximal phalanx.  Small first MPJ fusion likely septic arthritis.  Bipartite tibial hallux sesamoid with marrow edema highly suspicious for osteomyelitis as well as possible marrow edema and osteomyelitis in the fibular sesamoid Assessment:   1. Osteomyelitis of foot, left, acute (HCC)      Plan:  Patient was evaluated and treated and all questions answered.  # Osteomyelitis of first metatarsal head and first ray left foot -Discussed with patient I do recommend he proceed to the emergency department for further evaluation and treatment of this infection.  He does have signs of Systemic infection including nausea and fatigue.  -As the patient has been on antibiotics and still appears to  be struggling with this infection I recommend he be evaluated for more urgent amputation of the first ray.  He is now amenable to proceeding with first ray amputation should this be recommended to him which it is at this time as it was previously.  Do not feel we have the availability to wait for scheduling of this procedure as an outpatient given the progression of his infection from prior as the wound has progressed and is looking worse today than previously despite antibiotic and  wound care therapy -Dr. Logan Bores was alerted of the patient being sent to the emergency department for first ray amputation which will likely occur in the next day or 2. -In the meantime recommend IV antibiotics  No follow-ups on file.          Corinna Gab, DPM [facility] / [facility]

## 2023-09-20 NOTE — ED Provider Notes (Signed)
[place] EMERGENCY DEPARTMENT AT [facility] Provider Note   CSN: [id number] Arrival date & time: 09/20/23  1253     History  Chief Complaint  Patient presents with   Toe Pain    Raymond Walker. is a 72 y.o. male with PMH as listed below who presents via [facility] with toe pain. Patient reports that he went to podiatry for wound check. Per chart review patient was recently admitted from 08/22/2023 to 08/30/2023 for acute left first metatarsal/proximal phalanx osteomyelitis.  Was also found to have abnormal left ABI s/p left common femoral endarterectomy and femoral-popliteal bypass on 08/27/2023.  Patient was given vanc, Rocephin, Flagyl for osteomyelitis and was not interested in amputation at that time.  ID recommended 6 weeks of Doxy and Augmentin.  Recommended outpatient follow-up with podiatry.  Provider send pt to ED for admission. Pt is due for surgery next week, but podiatrist Dr. Levester Fresh send to ED for admission for sooner amputation. Pt without any complaint. Endorses very mild pain in his L foot/toe. Reports he's been taking his o/p abx faithfully including this Am. Denies f/c, pain in the foot, nausea/vomiting.     Past Medical History:  Diagnosis Date   Caregiver with fatigue 05/02/2020   COPD (chronic obstructive pulmonary disease) (HCC)    Deformity    equinus and metatarsal   Erectile dysfunction    Erectile dysfunction 05/02/2020   Full dentures    History of kidney stones    History of seizures 05/31/2020   Between ages 68 and 41   Hypertension    Impaired glucose tolerance    Pancreatitis, recurrent    Pneumonia    Thrombocytopenia (HCC) 08/10/2018   Staying in the low 100s will follow closely       Home Medications Prior to Admission medications   Medication Sig Start Date End Date Taking? Authorizing Provider  albuterol (VENTOLIN HFA) 108 (90 Base) MCG/ACT inhaler INHALE 2 PUFFS INTO THE LUNGS EVERY 4 HOURS AS NEEDED FOR WHEEZING OR  SHORTNESS OF BREATH 08/09/21   Babs Sciara, MD  amoxicillin-clavulanate (AUGMENTIN) 875-125 MG tablet Take 1 tablet by mouth every 12 (twelve) hours. 08/30/23 10/09/23  Osvaldo Shipper, MD  aspirin EC 81 MG tablet Take 1 tablet (81 mg total) by mouth daily. Swallow whole. 08/31/23   Osvaldo Shipper, MD  azelastine (ASTELIN) 0.1 % nasal spray Place 2 sprays into both nostrils 2 (two) times daily. 07/16/23   Babs Sciara, MD  budesonide-formoterol (SYMBICORT) 160-4.5 MCG/ACT inhaler Inhale 2 puffs into the lungs 2 (two) times daily. 07/16/23   Babs Sciara, MD  clopidogrel (PLAVIX) 75 MG tablet Take 1 tablet (75 mg total) by mouth daily. 08/30/23   Osvaldo Shipper, MD  cyanocobalamin 1000 MCG tablet Take 1 tablet (1,000 mcg total) by mouth daily. 08/31/23   Osvaldo Shipper, MD  doxycycline (VIBRA-TABS) 100 MG tablet Take 1 tablet (100 mg total) by mouth every 12 (twelve) hours. 08/30/23 10/09/23  Osvaldo Shipper, MD  ondansetron (ZOFRAN-ODT) 4 MG disintegrating tablet Take 1 tablet (4 mg total) by mouth every 8 (eight) hours as needed for nausea. 09/11/23   Campbell Riches, NP  oxyCODONE (OXY IR/ROXICODONE) 5 MG immediate release tablet Take 1 tablet (5 mg total) by mouth every 6 (six) hours as needed for severe pain. 08/30/23   Osvaldo Shipper, MD  pantoprazole (PROTONIX) 40 MG tablet TAKE (1) TABLET BY MOUTH ONCE DAILY. 03/14/23   Babs Sciara, MD  polyethylene glycol  powder (GLYCOLAX/MIRALAX) 17 GM/SCOOP powder Take 17 g by mouth daily as needed for mild constipation. 08/30/23   Osvaldo Shipper, MD  rosuvastatin (CRESTOR) 20 MG tablet Take 1 tablet (20 mg total) by mouth daily. 08/31/23   Osvaldo Shipper, MD      Allergies    Patient has no known allergies.    Review of Systems   Review of Systems A 10 point review of systems was performed and is negative unless otherwise reported in HPI.  Physical Exam Updated Vital Signs BP 114/87   Pulse 82   Temp 98.1 F (36.7 C)   Resp 16   SpO2  96%  Physical Exam General: Chronically ill-appearing male, lying in bed. Non-toxic appearing. HEENT: Sclera anicteric, MMM, trachea midline.  Cardiology: RRR, no murmurs/rubs/gallops. Resp: Normal respiratory rate and effort. CTAB, no wheezes, rhonchi, crackles.  Abd: Soft, non-tender, non-distended. No rebound tenderness or guarding.  GU: Deferred. MSK: S/p R transmetatarsal amputation. 1.5x1.5cm circular wound on proximal L first toe, medially, with surrounding erythema and mild TTP. No purulent drainage. No significant edema, and compartments are soft. No peripheral edema or signs of trauma. Extremities without deformity. Intact DP/PT pulses bilaterally. Please see photo below. Skin: warm, dry.  Neuro: A&Ox4, CNs II-XII grossly intact. MAEs. Sensation grossly intact.  Psych: Normal mood and affect.     ED Results / Procedures / Treatments   Labs (all labs ordered are listed, but only abnormal results are displayed) Labs Reviewed  CBC WITH DIFFERENTIAL/PLATELET - Abnormal; Notable for the following components:      Result Value   Hemoglobin 12.9 (*)    RDW 16.2 (*)    All other components within normal limits  COMPREHENSIVE METABOLIC PANEL  PROTIME-INR  SEDIMENTATION RATE  C-REACTIVE PROTEIN  PREALBUMIN    EKG None  Radiology No results found.  Procedures Procedures    Medications Ordered in ED Medications - No data to display  ED Course/ Medical Decision Making/ A&P                          Medical Decision Making Amount and/or Complexity of Data Reviewed Labs: ordered. Decision-making details documented in ED Course.  Risk Decision regarding hospitalization.    MDM:    Pt with known osteomyelitis, compliant with o/p abx, sent by podiatry for admission for amputation. No fever/chills, tachycardia, or systemic symptoms to indicate bacteremia. He has good pulses in LLE, much improved patient states after fem-pop bypass. No compartment syndrome or c/f nec  fasc. Will consult with podiatry and admit.   Clinical Course as of 09/20/23 1417  Fri Sep 20, 2023  1346 Consulted to podiatry [HN]  1401 D/w Dr. Logan Bores who will come to see patient.  [HN]  1414 WBC: 8.6 No leukocytosis [HN]  1414 Admitted to hospitalist Dr. Robb Matar [HN]    Clinical Course User Index [HN] Loetta Rough, MD    Labs: I Ordered, and personally interpreted labs.  The pertinent results include:  those listed above  Additional history obtained from chart review  Social Determinants of Health: Lives independently  Disposition:  Admitted to hospitalist w/ podiatry following  Co morbidities that complicate the patient evaluation  Past Medical History:  Diagnosis Date   Caregiver with fatigue 05/02/2020   COPD (chronic obstructive pulmonary disease) (HCC)    Deformity    equinus and metatarsal   Erectile dysfunction    Erectile dysfunction 05/02/2020   Full dentures  History of kidney stones    History of seizures 05/31/2020   Between ages 34 and 16   Hypertension    Impaired glucose tolerance    Pancreatitis, recurrent    Pneumonia    Thrombocytopenia (HCC) 08/10/2018   Staying in the low 100s will follow closely     Medicines No orders of the defined types were placed in this encounter.   I have reviewed the patients home medicines and have made adjustments as needed  Problem List / ED Course: Problem List Items Addressed This Visit       Musculoskeletal and Integument   * (Principal) Osteomyelitis of left foot (HCC) - Primary                This note was created using dictation software, which may contain spelling or grammatical errors.    Loetta Rough, MD 09/20/23 [phone]

## 2023-09-20 NOTE — H&P (View-Only) (Signed)
PODIATRY CONSULTATION  NAME Raymond Walker. MRN [id number] DOB [birth year] DOA 09/20/2023   Reason for consult: OM LT foot Chief Complaint  Patient presents with   Toe Pain    Consulting physician: Vivi Barrack, MD; [facility]  History of present illness: 72 y.o. male PMHx PAD, COPD, osteomyelitis of the left foot admitted for worsening infection to the left foot.  Patient seen earlier today in the podiatry clinic and recommended coming to the emergency department for admission and workup.  Progressive deterioration of the left foot wound.  Upon last admission patient had refused amputation but now he is amenable to this plan.  Podiatry consulted for surgical consult and inpatient management of the osteomyelitis  Past Medical History:  Diagnosis Date   Caregiver with fatigue 05/02/2020   COPD (chronic obstructive pulmonary disease) (HCC)    Deformity    equinus and metatarsal   Erectile dysfunction    Erectile dysfunction 05/02/2020   Full dentures    History of kidney stones    History of seizures 05/31/2020   Between ages 40 and 49   Hypertension    Impaired glucose tolerance    Pancreatitis, recurrent    Pneumonia    Thrombocytopenia (HCC) 08/10/2018   Staying in the low 100s will follow closely       Latest Ref Rng & Units 09/20/2023    1:58 PM 08/30/2023    1:00 PM 08/29/2023    5:09 AM  CBC  WBC 4.0 - 10.5 K/uL 8.6  9.9  8.2   Hemoglobin 13.0 - 17.0 g/dL 20.2  9.2  8.5   Hematocrit 39.0 - 52.0 % 41.8  29.1  26.4   Platelets 150 - 400 K/uL 195  234  190        Latest Ref Rng & Units 09/20/2023    1:58 PM 08/30/2023    1:00 PM 08/29/2023    5:09 AM  BMP  Glucose 70 - 99 mg/dL 542  706  237   BUN 8 - 23 mg/dL 21  18  20    Creatinine 0.61 - 1.24 mg/dL 6.28  3.15  1.76   Sodium 135 - 145 mmol/L 139  139  140   Potassium 3.5 - 5.1 mmol/L 4.6  4.5  3.4   Chloride 98 - 111 mmol/L 101  106  109   CO2 22 - 32 mmol/L 27  22  22    Calcium 8.9 - 10.3 mg/dL 9.6  8.5  8.2      LT foot 09/20/2023   Physical Exam: General: The patient is alert and oriented x3 in no acute distress.   Dermatology: See above photo  Vascular:  08/26/2023 Dr. 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 Randie Heinz, MD Procedure Performed: 1.  Ultrasound-guided cannulation right common femoral artery 2.  Catheter aorta and aortogram with pelvic angiography 3.  Selection of the left common femoral artery and left lower extremity angiogram 4.  Right lower extremity angiogram 5.  Stent of left external iliac artery with 8 x 40 mm Cook [place] postdilated with 7 mm balloon 6.  Moderate sedation with fentanyl Versed for 39 minutes Findings: The aorta is free of flow-limiting stenosis.  There are right common and external iliac artery stents which are patent and the common femoral artery on the right is patent with an occluded bypass which reconstitutes the popliteal artery with runoff dominant via the  posterior tibial artery.  The left side is the site of interest.  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  The SFA has a store takeoff and then it is occluded for a long segment reconstituting above the knee popliteal artery with dominant runoff via a large posterior tibial artery.   Neurological: Diminished via light touch  Musculoskeletal Exam: Prior transmetatarsal amputation right foot  MR FOOT LEFT WO CONTRAST 08/23/2023 IMPRESSION: 1. Acute osteomyelitis of the first metatarsal head and base of the great toe proximal phalanx. 2. Small first MTP joint effusion, likely septic arthritis. 3. Bipartite tibial hallux  sesamoid with marrow edema, highly suspicious for osteomyelitis. 4. Mild marrow edema within the fibular hallux sesamoid, nonspecific. 5. Soft tissue wound along the medial aspect of the forefoot adjacent to the first metatarsal head. No organized or drainable fluid collection.   ASSESSMENT/PLAN OF CARE Osteomyelitis/septic arthritis first MTP left foot  -Patient seen at bedside this evening -MRI reviewed personally.  Patient is amenable to surgery at this time.  Discussed partial first ray amputation which I believe is appropriate for the patient.  He is amenable to this plan and states that "if there is anything else that needs to be done while in there get it done".   -Preoperative orders placed.  Partial first ray amputation left.  Any other indicated procedure.  Surgery tentatively planned for tomorrow, 09/21/2023, around noon. NPO after midnight -Continue antibiotics as ordered upon admission -Will follow     Thank you for the consult.  Please contact me directly via secure chat with any questions or concerns.     Felecia Shelling, DPM [facility]  Dr. Felecia Shelling, DPM    [address] [place], [place] [address]                Office [phone]  Fax [phone]

## 2023-09-21 ENCOUNTER — Other Ambulatory Visit: Payer: Self-pay

## 2023-09-21 ENCOUNTER — Inpatient Hospital Stay (HOSPITAL_COMMUNITY): Payer: Medicare HMO | Admitting: Anesthesiology

## 2023-09-21 ENCOUNTER — Encounter (HOSPITAL_COMMUNITY)
Admission: EM | Disposition: A | Discharge status: Nursing Home (Includes ICF) | Payer: Self-pay | Source: Ambulatory Visit | Attending: Internal Medicine

## 2023-09-21 ENCOUNTER — Inpatient Hospital Stay (HOSPITAL_COMMUNITY): Payer: Medicare HMO

## 2023-09-21 DIAGNOSIS — M86172 Other acute osteomyelitis, left ankle and foot: Secondary | ICD-10-CM | POA: Diagnosis not present

## 2023-09-21 DIAGNOSIS — M00872 Arthritis due to other bacteria, left ankle and foot: Secondary | ICD-10-CM

## 2023-09-21 HISTORY — PX: AMPUTATION: SHX166

## 2023-09-21 LAB — COMPREHENSIVE METABOLIC PANEL
ALT: 13 U/L (ref 0–44)
AST: 11 U/L — ABNORMAL LOW (ref 15–41)
Albumin: 3 g/dL — ABNORMAL LOW (ref 3.5–5.0)
Alkaline Phosphatase: 78 U/L (ref 38–126)
Anion gap: 11 (ref 5–15)
BUN: 20 mg/dL (ref 8–23)
CO2: 26 mmol/L (ref 22–32)
Calcium: 8.8 mg/dL — ABNORMAL LOW (ref 8.9–10.3)
Chloride: 100 mmol/L (ref 98–111)
Creatinine, Ser: 0.97 mg/dL (ref 0.61–1.24)
GFR, Estimated: >60 mL/min (ref >60)
Glucose, Bld: 99 mg/dL (ref 70–99)
Potassium: 3.6 mmol/L (ref 3.5–5.1)
Sodium: 137 mmol/L (ref 135–145)
Total Bilirubin: 0.5 mg/dL (ref 0.3–1.2)
Total Protein: 6.5 g/dL (ref 6.5–8.1)

## 2023-09-21 LAB — CBC
HCT: 33.2 % — ABNORMAL LOW (ref 39.0–52.0)
Hemoglobin: 10.4 g/dL — ABNORMAL LOW (ref 13.0–17.0)
MCH: 28.9 pg (ref 26.0–34.0)
MCHC: 31.3 g/dL (ref 30.0–36.0)
MCV: 92.2 fL (ref 80.0–100.0)
Platelets: 146 10*3/uL — ABNORMAL LOW (ref 150–400)
RBC: 3.6 MIL/uL — ABNORMAL LOW (ref 4.22–5.81)
RDW: 16 % — ABNORMAL HIGH (ref 11.5–15.5)
WBC: 5.6 10*3/uL (ref 4.0–10.5)
nRBC: 0 % (ref 0.0–0.2)

## 2023-09-21 LAB — SURGICAL PCR SCREEN
MRSA, PCR: NEGATIVE
Staphylococcus aureus: NEGATIVE

## 2023-09-21 LAB — GLUCOSE, CAPILLARY
Glucose-Capillary: 108 mg/dL — ABNORMAL HIGH (ref 70–99)
Glucose-Capillary: 99 mg/dL (ref 70–99)
Glucose-Capillary: 149 mg/dL — ABNORMAL HIGH (ref 70–99)

## 2023-09-21 SURGERY — AMPUTATION DIGIT
Anesthesia: Monitor Anesthesia Care | Site: Foot | Laterality: Left

## 2023-09-21 MED ORDER — 0.9 % SODIUM CHLORIDE (POUR BTL) OPTIME
TOPICAL | Status: DC | PRN
  Administered 2023-09-21: 1000 mL

## 2023-09-21 MED ORDER — PHENYLEPHRINE 80 MCG/ML (10ML) SYRINGE FOR IV PUSH (FOR BLOOD PRESSURE SUPPORT)
PREFILLED_SYRINGE | INTRAVENOUS | Status: AC
  Filled 2023-09-21: qty 10

## 2023-09-21 MED ORDER — PROPOFOL 500 MG/50ML IV EMUL
INTRAVENOUS | Status: DC | PRN
  Administered 2023-09-21: 75 ug/kg/min via INTRAVENOUS

## 2023-09-21 MED ORDER — FENTANYL CITRATE (PF) 100 MCG/2ML IJ SOLN
INTRAMUSCULAR | Status: AC
  Filled 2023-09-21: qty 2

## 2023-09-21 MED ORDER — FENTANYL CITRATE (PF) 100 MCG/2ML IJ SOLN
INTRAMUSCULAR | Status: DC | PRN
  Administered 2023-09-21: 50 ug via INTRAVENOUS

## 2023-09-21 MED ORDER — ONDANSETRON HCL 4 MG/2ML IJ SOLN
INTRAMUSCULAR | Status: DC | PRN
  Administered 2023-09-21: 4 mg via INTRAVENOUS

## 2023-09-21 MED ORDER — EPHEDRINE SULFATE-NACL 50-0.9 MG/10ML-% IV SOSY
PREFILLED_SYRINGE | INTRAVENOUS | Status: DC | PRN
  Administered 2023-09-21: 10 mg via INTRAVENOUS

## 2023-09-21 MED ORDER — PROPOFOL 10 MG/ML IV BOLUS
INTRAVENOUS | Status: AC
  Filled 2023-09-21: qty 20

## 2023-09-21 MED ORDER — BUPIVACAINE HCL (PF) 0.5 % IJ SOLN
INTRAMUSCULAR | Status: DC | PRN
  Administered 2023-09-21: 10 mL

## 2023-09-21 MED ORDER — BUPIVACAINE HCL (PF) 0.5 % IJ SOLN
INTRAMUSCULAR | Status: AC
  Filled 2023-09-21: qty 30

## 2023-09-21 MED ORDER — OXYCODONE HCL 5 MG PO TABS: 5.0000 mg | ORAL_TABLET | Freq: Once | ORAL | Status: DC | PRN

## 2023-09-21 MED ORDER — PROPOFOL 10 MG/ML IV BOLUS
INTRAVENOUS | Status: DC | PRN
  Administered 2023-09-21: 20 mg via INTRAVENOUS
  Administered 2023-09-21: 10 mg via INTRAVENOUS
  Administered 2023-09-21: 20 mg via INTRAVENOUS

## 2023-09-21 MED ORDER — LACTATED RINGERS IV SOLN
INTRAVENOUS | Status: DC | PRN
Start: 2023-09-21 — End: 2023-09-21

## 2023-09-21 MED ORDER — OXYCODONE HCL 5 MG/5ML PO SOLN: 5.0000 mg | Freq: Once | ORAL | Status: DC | PRN

## 2023-09-21 MED ORDER — ONDANSETRON HCL 4 MG/2ML IJ SOLN: 4.0000 mg | Freq: Once | INTRAMUSCULAR | Status: DC | PRN

## 2023-09-21 MED ORDER — LIDOCAINE HCL 2 % IJ SOLN
INTRAMUSCULAR | Status: AC
  Filled 2023-09-21: qty 20

## 2023-09-21 MED ORDER — LIDOCAINE HCL 2 % IJ SOLN
INTRAMUSCULAR | Status: DC | PRN
  Administered 2023-09-21: 10 mL

## 2023-09-21 MED ORDER — LIDOCAINE HCL (PF) 2 % IJ SOLN
INTRAMUSCULAR | Status: AC
  Filled 2023-09-21: qty 5

## 2023-09-21 MED ORDER — LIDOCAINE 2% (20 MG/ML) 5 ML SYRINGE
INTRAMUSCULAR | Status: DC | PRN
  Administered 2023-09-21: 20 mg via INTRAVENOUS

## 2023-09-21 MED ORDER — PHENYLEPHRINE 80 MCG/ML (10ML) SYRINGE FOR IV PUSH (FOR BLOOD PRESSURE SUPPORT)
PREFILLED_SYRINGE | INTRAVENOUS | Status: DC | PRN
  Administered 2023-09-21 (×2): 160 ug via INTRAVENOUS
  Administered 2023-09-21: 80 ug via INTRAVENOUS
  Administered 2023-09-21 (×5): 160 ug via INTRAVENOUS

## 2023-09-21 MED ORDER — PROPOFOL 1000 MG/100ML IV EMUL
INTRAVENOUS | Status: AC
  Filled 2023-09-21: qty 100

## 2023-09-21 MED ORDER — FENTANYL CITRATE PF 50 MCG/ML IJ SOSY: 25.0000 ug | PREFILLED_SYRINGE | INTRAMUSCULAR | Status: DC | PRN

## 2023-09-21 MED ORDER — EPHEDRINE 5 MG/ML INJ
INTRAVENOUS | Status: AC
  Filled 2023-09-21: qty 5

## 2023-09-21 SURGICAL SUPPLY — 46 items
APL PRP STRL LF DISP 70% ISPRP (MISCELLANEOUS)
APL SKNCLS STERI-STRIP NONHPOA (GAUZE/BANDAGES/DRESSINGS) ×1
BAG COUNTER SPONGE SURGICOUNT (BAG) ×1 IMPLANT
BAG SPNG CNTER NS LX DISP (BAG) ×1
BENZOIN TINCTURE PRP APPL 2/3 (GAUZE/BANDAGES/DRESSINGS) ×1 IMPLANT
BLADE AVERAGE 25X9 (BLADE) IMPLANT
BLADE OSCILLATING/SAGITTAL (BLADE) ×1
BLADE SURG 15 STRL LF DISP TIS (BLADE) IMPLANT
BLADE SURG 15 STRL SS (BLADE)
BLADE SW THK.38XMED LNG THN (BLADE) ×1 IMPLANT
BNDG CMPR 5X4 KNIT ELC UNQ LF (GAUZE/BANDAGES/DRESSINGS) ×1
BNDG ELASTIC 4INX 5YD STR LF (GAUZE/BANDAGES/DRESSINGS) IMPLANT
BNDG GAUZE DERMACEA FLUFF 4 (GAUZE/BANDAGES/DRESSINGS) ×1 IMPLANT
BNDG GZE DERMACEA 4 6PLY (GAUZE/BANDAGES/DRESSINGS) ×1
CHLORAPREP W/TINT 26 (MISCELLANEOUS) IMPLANT
COVER SURGICAL LIGHT HANDLE (MISCELLANEOUS) ×1 IMPLANT
CUFF TOURN SGL QUICK 18X4 (TOURNIQUET CUFF) ×1 IMPLANT
CUFF TOURN SGL QUICK 24 (TOURNIQUET CUFF)
CUFF TRNQT CYL 24X4X16.5-23 (TOURNIQUET CUFF) IMPLANT
ELECT REM PT RETURN 15FT ADLT (MISCELLANEOUS) ×1 IMPLANT
GAUZE PACKING IODOFORM 1/4X15 (PACKING) ×1 IMPLANT
GAUZE PAD ABD 8X10 STRL (GAUZE/BANDAGES/DRESSINGS) ×1 IMPLANT
GAUZE SPONGE 4X4 12PLY STRL (GAUZE/BANDAGES/DRESSINGS) ×1 IMPLANT
GAUZE XEROFORM 1X8 LF (GAUZE/BANDAGES/DRESSINGS) ×1 IMPLANT
GAUZE XEROFORM 5X9 LF (GAUZE/BANDAGES/DRESSINGS) IMPLANT
GLOVE BIO SURGEON STRL SZ8 (GLOVE) ×2 IMPLANT
GOWN STRL REUS W/ TWL LRG LVL3 (GOWN DISPOSABLE) ×2 IMPLANT
GOWN STRL REUS W/TWL LRG LVL3 (GOWN DISPOSABLE) ×2
KIT BASIN OR (CUSTOM PROCEDURE TRAY) ×1 IMPLANT
KIT TURNOVER KIT A (KITS) ×1 IMPLANT
NS IRRIG 1000ML POUR BTL (IV SOLUTION) ×1 IMPLANT
PACK ORTHO EXTREMITY (CUSTOM PROCEDURE TRAY) ×1 IMPLANT
PAD ARMBOARD 7.5X6 YLW CONV (MISCELLANEOUS) ×2 IMPLANT
PAD CAST 4YDX4 CTTN HI CHSV (CAST SUPPLIES) IMPLANT
PADDING CAST COTTON 4X4 STRL (CAST SUPPLIES)
SOL PREP POV-IOD 4OZ 10% (MISCELLANEOUS) ×1 IMPLANT
STAPLER VISISTAT 35W (STAPLE) IMPLANT
STRIP CLOSURE SKIN 1/2X4 (GAUZE/BANDAGES/DRESSINGS) ×1 IMPLANT
SUT PROLENE 4 0 PS 2 18 (SUTURE) IMPLANT
SUT VIC AB 3-0 PS2 18 (SUTURE)
SUT VIC AB 3-0 PS2 18XBRD (SUTURE) IMPLANT
SUT VIC AB 4-0 PS2 18 (SUTURE) ×1 IMPLANT
SYR CONTROL 10ML LL (SYRINGE) ×2 IMPLANT
TOWEL GREEN STERILE FF (TOWEL DISPOSABLE) ×1 IMPLANT
TUBING CONNECTING 10 (TUBING) ×1 IMPLANT
YANKAUER SUCT BULB TIP NO VENT (SUCTIONS) IMPLANT

## 2023-09-21 NOTE — Interval H&P Note (Signed)
History and Physical Interval Note:  09/21/2023 12:07 PM  Raymond Walker.  has presented today for surgery, with the diagnosis of Osteomyelitis/septic arthritis first MTP left foot.  The various methods of treatment have been discussed with the patient and family. After consideration of risks, benefits and other options for treatment, the patient has consented to  Procedure(s): AMPUTATION DIGIT (Left) as a surgical intervention.  The patient's history has been reviewed, patient examined, no change in status, stable for surgery.  I have reviewed the patient's chart and labs.  Questions were answered to the patient's satisfaction.     Felecia Shelling

## 2023-09-21 NOTE — Brief Op Note (Signed)
09/21/2023  1:03 PM  PATIENT:  Raymond Walker.  72 y.o. male  PRE-OPERATIVE DIAGNOSIS:  Osteomyelitis/septic arthritis first MTP left foot  POST-OPERATIVE DIAGNOSIS:  Osteomyelitis/septic arthritis first MTP left foo  PROCEDURE:  Procedure(s): AMPUTATION DIGIT (Left)  SURGEON:  Surgeons and Role:    Felecia Shelling, DPM - Primary  PHYSICIAN ASSISTANT:   ASSISTANTS: none   ANESTHESIA:   local and MAC  EBL:  minimal   BLOOD ADMINISTERED:none  DRAINS: none   LOCAL MEDICATIONS USED:  MARCAINE   , LIDOCAINE , and Amount: 20 ml  SPECIMEN:  Source of Specimen:  first metatarsal  DISPOSITION OF SPECIMEN:  PATHOLOGY  COUNTS:  YES  TOURNIQUET:   Total Tourniquet Time Documented: Calf (Left) - 19 minutes Total: Calf (Left) - 19 minutes   DICTATION: .Reubin Milan Dictation  PLAN OF CARE: Admit to inpatient   PATIENT DISPOSITION:  PACU - hemodynamically stable.   Delay start of Pharmacological VTE agent (>24hrs) due to surgical blood loss or risk of bleeding: no  Felecia Shelling, DPM [facility]  Dr. Felecia Shelling, DPM    [address] [place], [place] [address]                Office [phone]  Fax [phone]

## 2023-09-21 NOTE — Op Note (Signed)
OPERATIVE REPORT Patient name: Raymond Walker. MRN: [id number] DOB: 1951/12/05  DOS: 09/21/23  Preop Dx: Osteomyelitis left great toe Postop Dx: same  Procedure:  1.  Partial first ray amputation left  Surgeon: Felecia Shelling DPM  Anesthesia: 50-50 mixture of 2% lidocaine plain with 0.5% Marcaine plain totaling 20 mL infiltrated in the patient's left foot  Hemostasis: Calf tourniquet inflated to a pressure of without esmarch exsanguination   EBL: Minimal mL Materials: None Injectables: None Pathology: First metatarsal bone sent for both pathology and culture  Condition: The patient tolerated the procedure and anesthesia well. No complications noted or reported   Justification for procedure: The patient is a 72 y.o. male who presents today for surgical correction of osteomyelitis to the left great toe joint. All conservative modalities of been unsuccessful in providing any sort of satisfactory alleviation of symptoms with the patient. The patient was told benefits as well as possible side effects of the surgery. The patient consented for surgical correction. The patient consent form was reviewed. All patient questions were answered. No guarantees were expressed or implied. The patient and the surgeon both signed the patient consent form with the witness present and placed in the patient's chart.   Procedure in Detail: The patient was brought to the operating room, placed in the operating table in the supine position at which time an aseptic scrub and drape were performed about the patient's respective lower extremity after anesthesia was induced as described above. Attention was then directed to the surgical area where procedure number one commenced.  Procedure #1: Partial first ray amputation left A racquet type incision was planned and made around the first ray of the left foot.  Incision carried directly down to the level of bone using a #15 scalpel.  The great toe was  grasped with a perforating towel Clamp and disarticulated at the level of the MTP and sent for discard.  Additional soft tissue dissection was performed to better visualize the more proximal portion of the first metatarsal for the ensuing osteotomy.  Osteotomy was created at the proximal portion of the first metatarsal and the distal portion of the metatarsal was removed in toto.  A portion of the head of the first metatarsal was sent to pathology for bone culture and the remaining portion of the metatarsal was sent for pathology with a skin marker utilized to mark the most proximal portion of the metatarsal at the osteotomy site.  Copious irrigation was then utilized at this time and the amputation site was dried in preparation for primary closure.  Prior to closure any necrotic nonviable tissue was sharply resected away.  The soft tissue margins actually appeared very clean and viable.  Primary closure was achieved using combination of skin staples and 3-0 Prolene suture.  Dry sterile compressive dressings were then applied to all previously mentioned incision sites about the patient's lower extremity. The tourniquet which was used for hemostasis was deflated. All normal neurovascular responses including pink color and warmth returned to the remaining digits of patient's lower extremity.  The patient was then transferred from the operating room to the recovery room having tolerated the procedure and anesthesia well. All vital signs are stable. After a brief stay in the recovery room the patient was readmitted to inpatient room with postoperative orders placed.    IMPRESSION: Soft tissue margins appeared healthy.  The remaining portion of the first metatarsal also appeared healthy and viable.  I do not suspect long-term oral  or IV antibiotics are warranted.  Suspect clean margins both soft tissue and osseous. Good potential for healing.    Felecia Shelling, DPM [facility]  Dr. Felecia Shelling, DPM    [address] [place], [place] [address]                Office [phone]  Fax [phone]

## 2023-09-21 NOTE — Anesthesia Preprocedure Evaluation (Addendum)
Anesthesia Evaluation  Patient identified by MRN, date of birth, ID band Patient awake    Reviewed: Allergy & Precautions, NPO status , Patient's Chart, lab work & pertinent test results  History of Anesthesia Complications Negative for: history of anesthetic complications  Airway Mallampati: II  TM Distance: >3 FB Neck ROM: Full    Dental  (+) Edentulous Upper, Edentulous Lower   Pulmonary COPD,  COPD inhaler, Current Smoker and Patient abstained from smoking.   Pulmonary exam normal        Cardiovascular hypertension, + CAD and + Peripheral Vascular Disease  Normal cardiovascular exam     Neuro/Psych Seizures - (childhood), Well Controlled,  PSYCHIATRIC DISORDERS         GI/Hepatic Neg liver ROS,GERD  Medicated and Controlled,,  Endo/Other  negative endocrine ROS    Renal/GU negative Renal ROS     Musculoskeletal negative musculoskeletal ROS (+)    Abdominal   Peds  Hematology  (+) Blood dyscrasia, anemia  On plavix Plt 146k    Anesthesia Other Findings   Reproductive/Obstetrics                             Anesthesia Physical Anesthesia Plan  ASA: 3  Anesthesia Plan: MAC and Regional   Post-op Pain Management: Tylenol PO (pre-op)*   Induction:   PONV Risk Score and Plan: 0 and Propofol infusion and Treatment may vary due to age or medical condition  Airway Management Planned: Natural Airway and Simple Face Mask  Additional Equipment: None  Intra-op Plan:   Post-operative Plan:   Informed Consent: I have reviewed the patients History and Physical, chart, labs and discussed the procedure including the risks, benefits and alternatives for the proposed anesthesia with the patient or authorized representative who has indicated his/her understanding and acceptance.   Patient has DNR.  Discussed DNR with patient and Suspend DNR.     Plan Discussed with: CRNA,  Anesthesiologist and Surgeon  Anesthesia Plan Comments:         Anesthesia Quick Evaluation

## 2023-09-21 NOTE — Plan of Care (Signed)
  Problem: Activity: Goal: Ability to return to baseline activity level will improve Outcome: Progressing   

## 2023-09-21 NOTE — Plan of Care (Signed)

## 2023-09-21 NOTE — Transfer of Care (Signed)
Immediate Anesthesia Transfer of Care Note  Patient: Raymond Walker  Procedure(s) Performed: AMPUTATION DIGIT (Left: Foot)  Patient Location: PACU  Anesthesia Type:MAC  Level of Consciousness: drowsy and patient cooperative  Airway & Oxygen Therapy: Patient Spontanous Breathing and Patient connected to face mask oxygen  Post-op Assessment: Report given to RN and Post -op Vital signs reviewed and stable  Post vital signs: Reviewed and stable  Last Vitals:  Vitals Value Taken Time  BP 137/93 09/21/23 1310  Temp 36.4 C 09/21/23 1310  Pulse 91 09/21/23 1313  Resp 25 09/21/23 1313  SpO2 100 % 09/21/23 1313  Vitals shown include unfiled device data.  Last Pain:  Vitals:   09/21/23 1203  TempSrc: Oral  PainSc: 0-No pain      Patients Stated Pain Goal: 0 (09/21/23 1203)  Complications: No notable events documented.

## 2023-09-21 NOTE — Progress Notes (Signed)
PROGRESS NOTE    Raymond Walker.  JXB:[id number] DOB: [birth year] DOA: 09/20/2023 PCP: Babs Sciara, MD   Brief Narrative: 72 y.o. male with medical history significant of caregiver fatigue, COPD, equinus and metatarsal deformity, ED, nephrolithiasis, childhood seizures, hypertension, glucose intolerance, recurrent pancreatitis, history of pneumonia, thrombocytopenia, tobacco abuse, peripheral arterial disease in remission according to the patient who was referred by podiatry due to progressive worsening of his left foot wound which will require surgical amputation despite undergoing external iliac artery stenting initially with 90% stenosis and 0% residual with a palpable femoral pulse after procedure with vascular surgery 08/26/23  MRI of left foot: 08/23/23- showed acute osteomyelitis of the first metatarsal head and base of the great toe proximal phalanx.   Assessment & Plan:   Principal Problem:   Osteomyelitis of left foot (HCC) Active Problems:   Tobacco abuse   Aortic atherosclerosis (HCC)   Coronary artery calcification seen on CAT scan   COPD (chronic obstructive pulmonary disease) (HCC)   PAD (peripheral artery disease) (HCC)   Essential hypertension   GERD (gastroesophageal reflux disease)   #1 osteomyelitis of left foot in the setting of peripheral artery disease and status post recent external iliac artery stenting.   Status post first ray amputation left foot sent for pathology and culture Continue current antibiotics Rocephin Flagyl and Vanco Aspirin and Plavix on hold will restart when okay with Dr. Logan Bores Continue statin    Tobacco abuse-in remission.  Supportive care     Aortic atherosclerosis continue Crestor      Coronary artery calcification seen on CAT scan Resume DAPT once cleared by podiatry. Continue rosuvastatin 20 mg p.o. daily.     COPD (chronic obstructive pulmonary disease) (HCC) Continue Symbicort or formulary equivalent.     Essential  hypertension Not on antihypertensives.     GERD   Estimated body mass index is 20.95 kg/m as calculated from the following:   Height as of 09/11/23: 5\' 11"  (1.803 m).   Weight as of 09/11/23: 68.1 kg.  DVT prophylaxis:lovenox Code Status:full Family Communication:none Disposition Plan:  Status is: Inpatient Remains inpatient appropriate because: OM ON iv Antibiotics   Consultants:   Dr Logan Bores   Procedures:left foot first ray amputation Antimicrobials: Anti-infectives (From admission, onward)    Start     Dose/Rate Route Frequency Ordered Stop   09/21/23 1515  [MAR Hold]  vancomycin (VANCOREADY) IVPB 1250 mg/250 mL        (MAR Hold since Sat 09/21/2023 at 1202.Hold Reason: Transfer to a Procedural area)  Placed in "Followed by" Linked Group   1,250 mg 166.7 mL/hr over 90 Minutes Intravenous Every 24 hours 09/20/23 1440     09/20/23 2200  doxycycline (VIBRA-TABS) tablet 100 mg  Status:  Discontinued        100 mg Oral Every 12 hours 09/20/23 1853 09/20/23 1906   09/20/23 1515  vancomycin (VANCOREADY) IVPB 1500 mg/300 mL       Placed in "Followed by" Linked Group   1,500 mg 150 mL/hr over 120 Minutes Intravenous  Once 09/20/23 1440 09/20/23 1824   09/20/23 1430  [MAR Hold]  cefTRIAXone (ROCEPHIN) 1 g in sodium chloride 0.9 % 100 mL IVPB        (MAR Hold since Sat 09/21/2023 at 1202.Hold Reason: Transfer to a Procedural area)   1 g 200 mL/hr over 30 Minutes Intravenous Every 24 hours 09/20/23 1421 09/27/23 1429   09/20/23 1430  [MAR Hold]  metroNIDAZOLE (FLAGYL) IVPB 500 mg        (  MAR Hold since Sat 09/21/2023 at 1202.Hold Reason: Transfer to a Procedural area)   500 mg 100 mL/hr over 60 Minutes Intravenous Every 8 hours 09/20/23 1421 09/27/23 1429       Subjective:   Patient seen prior to OR anxious to have surgery done and ate some food denies any other complaints media reviewed with pictures of his left foot noted Objective: Vitals:   09/20/23 2135 09/21/23 0603 09/21/23  0945 09/21/23 1203  BP:  123/68 125/63 (!) 108/97  Pulse: 69 74 76 84  Resp: 18 16 17  (!) 21  Temp: 98.5 F (36.9 C) 99.1 F (37.3 C) 98.6 F (37 C) 98.2 F (36.8 C)  TempSrc: Oral Oral Oral Oral  SpO2: 100% 94% 99% 97%    Intake/Output Summary (Last 24 hours) at 09/21/2023 1304 Last data filed at 09/21/2023 1000 Gross per 24 hour  Intake 419.51 ml  Output 800 ml  Net -380.49 ml   There were no vitals filed for this visit.  Examination:  General exam: Appears in nad Respiratory system: Clear to auscultation. Respiratory effort normal. Cardiovascular system: S1 & S2 heard, RRR. No JVD, murmurs, rubs, gallops or clicks. No pedal edema. Gastrointestinal system: Abdomen is nondistended, soft and nontender. No organomegaly or masses felt. Normal bowel sounds heard. Central nervous system: Alert and oriented. Extremities: Left foot is covered with dressing   Data Reviewed: I have personally reviewed following labs and imaging studies  CBC: Recent Labs  Lab 09/20/23 1358 09/21/23 0511  WBC 8.6 5.6  NEUTROABS 7.0  --   HGB 12.9* 10.4*  HCT 41.8 33.2*  MCV 93.3 92.2  PLT 195 146*   Basic Metabolic Panel: Recent Labs  Lab 09/20/23 1358 09/21/23 0511  NA 139 137  K 4.6 3.6  CL 101 100  CO2 27 26  GLUCOSE 142* 99  BUN 21 20  CREATININE 1.14 0.97  CALCIUM 9.6 8.8*   GFR: Estimated Creatinine Clearance: 65.3 mL/min (by C-G formula based on SCr of 0.97 mg/dL). Liver Function Tests: Recent Labs  Lab 09/20/23 1358 09/21/23 0511  AST 18 11*  ALT 19 13  ALKPHOS 105 78  BILITOT 0.7 0.5  PROT 8.9* 6.5  ALBUMIN 3.8 3.0*   No results for input(s): "LIPASE", "AMYLASE" in the last 168 hours. No results for input(s): "AMMONIA" in the last 168 hours. Coagulation Profile: Recent Labs  Lab 09/20/23 1358  INR 1.2   Cardiac Enzymes: No results for input(s): "CKTOTAL", "CKMB", "CKMBINDEX", "TROPONINI" in the last 168 hours. BNP (last 3 results) No results for  input(s): "PROBNP" in the last 8760 hours. HbA1C: No results for input(s): "HGBA1C" in the last 72 hours. CBG: Recent Labs  Lab 09/20/23 2213 09/21/23 0733 09/21/23 1123  GLUCAP 119* 108* 99   Lipid Profile: No results for input(s): "CHOL", "HDL", "LDLCALC", "TRIG", "CHOLHDL", "LDLDIRECT" in the last 72 hours. Thyroid Function Tests: No results for input(s): "TSH", "T4TOTAL", "FREET4", "T3FREE", "THYROIDAB" in the last 72 hours. Anemia Panel: No results for input(s): "VITAMINB12", "FOLATE", "FERRITIN", "TIBC", "IRON", "RETICCTPCT" in the last 72 hours. Sepsis Labs: No results for input(s): "PROCALCITON", "LATICACIDVEN" in the last 168 hours.  Recent Results (from the past 240 hour(s))  Surgical pcr screen     Status: None   Collection Time: 09/21/23  6:23 AM   Specimen: Nasal Mucosa; Nasal Swab  Result Value Ref Range Status   MRSA, PCR NEGATIVE NEGATIVE Final   Staphylococcus aureus NEGATIVE NEGATIVE Final    Comment: (NOTE) The Xpert  SA Assay (FDA approved for NASAL specimens in patients 78 years of age and older), is one component of a comprehensive surveillance program. It is not intended to diagnose infection nor to guide or monitor treatment. Performed at [facility], [address] [place], [place] [address]      Radiology Studies: No results found.  Scheduled Meds:  [MAR Hold] azelastine  2 spray Each Nare BID   [MAR Hold] enoxaparin (LOVENOX) injection  40 mg Subcutaneous Q24H   [MAR Hold] insulin aspart  0-15 Units Subcutaneous TID WC   [MAR Hold] mometasone-formoterol  2 puff Inhalation BID   [MAR Hold] pantoprazole  40 mg Oral Daily   [MAR Hold] rosuvastatin  20 mg Oral Daily   Continuous Infusions:  [MAR Hold] cefTRIAXone (ROCEPHIN)  IV 1 g (09/20/23 1436)   [MAR Hold] metronidazole 500 mg (09/21/23 0554)   [MAR Hold] vancomycin       LOS: 1 day    Time spent: 39 min  Alwyn Ren, MD 09/21/2023, 1:04 PM

## 2023-09-21 NOTE — Anesthesia Postprocedure Evaluation (Signed)
Anesthesia Post Note  Patient: Raymond Walker.  Procedure(s) Performed: AMPUTATION DIGIT (Left: Foot)     Patient location during evaluation: PACU Anesthesia Type: Regional and MAC Level of consciousness: awake and alert Pain management: pain level controlled Vital Signs Assessment: post-procedure vital signs reviewed and stable Respiratory status: spontaneous breathing, nonlabored ventilation and respiratory function stable Cardiovascular status: stable and blood pressure returned to baseline Anesthetic complications: no   No notable events documented.  Last Vitals:  Vitals:   09/21/23 1400 09/21/23 1415  BP: 107/62 108/77  Pulse: 77 77  Resp: 20 18  Temp:    SpO2: 94% 96%    Last Pain:  Vitals:   09/21/23 1400  TempSrc:   PainSc: 0-No pain                 Beryle Lathe

## 2023-09-22 ENCOUNTER — Encounter (HOSPITAL_COMMUNITY): Payer: Self-pay | Admitting: Podiatry

## 2023-09-22 DIAGNOSIS — M86172 Other acute osteomyelitis, left ankle and foot: Secondary | ICD-10-CM | POA: Diagnosis not present

## 2023-09-22 LAB — CBC
HCT: 32.8 % — ABNORMAL LOW (ref 39.0–52.0)
Hemoglobin: 10 g/dL — ABNORMAL LOW (ref 13.0–17.0)
MCH: 28.6 pg (ref 26.0–34.0)
MCHC: 30.5 g/dL (ref 30.0–36.0)
MCV: 93.7 fL (ref 80.0–100.0)
Platelets: 149 10*3/uL — ABNORMAL LOW (ref 150–400)
RBC: 3.5 MIL/uL — ABNORMAL LOW (ref 4.22–5.81)
RDW: 16 % — ABNORMAL HIGH (ref 11.5–15.5)
WBC: 4.6 10*3/uL (ref 4.0–10.5)
nRBC: 0 % (ref 0.0–0.2)

## 2023-09-22 LAB — COMPREHENSIVE METABOLIC PANEL
ALT: 11 U/L (ref 0–44)
AST: 12 U/L — ABNORMAL LOW (ref 15–41)
Albumin: 2.8 g/dL — ABNORMAL LOW (ref 3.5–5.0)
Alkaline Phosphatase: 70 U/L (ref 38–126)
Anion gap: 9 (ref 5–15)
BUN: 18 mg/dL (ref 8–23)
CO2: 26 mmol/L (ref 22–32)
Calcium: 8.5 mg/dL — ABNORMAL LOW (ref 8.9–10.3)
Chloride: 103 mmol/L (ref 98–111)
Creatinine, Ser: 0.87 mg/dL (ref 0.61–1.24)
GFR, Estimated: >60 mL/min (ref >60)
Glucose, Bld: 118 mg/dL — ABNORMAL HIGH (ref 70–99)
Potassium: 3.5 mmol/L (ref 3.5–5.1)
Sodium: 138 mmol/L (ref 135–145)
Total Bilirubin: 0.6 mg/dL (ref 0.3–1.2)
Total Protein: 6.4 g/dL — ABNORMAL LOW (ref 6.5–8.1)

## 2023-09-22 LAB — C-REACTIVE PROTEIN: CRP: 3.3 mg/dL — ABNORMAL HIGH (ref <1.0)

## 2023-09-22 LAB — SEDIMENTATION RATE: Sed Rate: 50 mm/h — ABNORMAL HIGH (ref 0–16)

## 2023-09-22 MED ORDER — DOXYCYCLINE MONOHYDRATE 100 MG PO TABS: 100.0000 mg | ORAL_TABLET | Freq: Two times a day (BID) | ORAL | 0 refills | Status: DC

## 2023-09-22 MED ORDER — DOXYCYCLINE HYCLATE 100 MG PO TABS
100.0000 mg | ORAL_TABLET | Freq: Two times a day (BID) | ORAL | Status: DC
  Administered 2023-09-22 – 2023-09-25 (×7): 100 mg via ORAL
  Filled 2023-09-22 (×9): qty 1

## 2023-09-22 MED ORDER — CLOPIDOGREL BISULFATE 75 MG PO TABS: 75.0000 mg | ORAL_TABLET | Freq: Every day | ORAL | 2 refills | Status: DC

## 2023-09-22 MED ORDER — OXYCODONE HCL 5 MG PO TABS: 5.0000 mg | ORAL_TABLET | ORAL | 0 refills | Status: DC | PRN

## 2023-09-22 MED ORDER — PANTOPRAZOLE SODIUM 40 MG PO TBEC: DELAYED_RELEASE_TABLET | ORAL | 0 refills | Status: DC

## 2023-09-22 MED ORDER — ROSUVASTATIN CALCIUM 20 MG PO TABS: 20.0000 mg | ORAL_TABLET | Freq: Every day | ORAL | 2 refills | Status: DC

## 2023-09-22 NOTE — Plan of Care (Signed)
Problem: Education: Goal: Understanding of CV disease, CV risk reduction, and recovery process will improve Outcome: Adequate for Discharge Goal: Individualized Educational Video(s) Outcome: Adequate for Discharge   Problem: Activity: Goal: Ability to return to baseline activity level will improve Outcome: Adequate for Discharge   Problem: Cardiovascular: Goal: Ability to achieve and maintain adequate cardiovascular perfusion will improve Outcome: Adequate for Discharge Goal: Vascular access site(s) Level 0-1 will be maintained Outcome: Adequate for Discharge   Problem: Health Behavior/Discharge Planning: Goal: Ability to safely manage health-related needs after discharge will improve Outcome: Adequate for Discharge   Problem: Education: Goal: Knowledge of General Education information will improve Description: Including pain rating scale, medication(s)/side effects and non-pharmacologic comfort measures Outcome: Adequate for Discharge   Problem: Health Behavior/Discharge Planning: Goal: Ability to manage health-related needs will improve Outcome: Adequate for Discharge   Problem: Clinical Measurements: Goal: Ability to maintain clinical measurements within normal limits will improve Outcome: Adequate for Discharge Goal: Will remain free from infection Outcome: Adequate for Discharge Goal: Diagnostic test results will improve Outcome: Adequate for Discharge Goal: Respiratory complications will improve Outcome: Adequate for Discharge Goal: Cardiovascular complication will be avoided Outcome: Adequate for Discharge   Problem: Activity: Goal: Risk for activity intolerance will decrease Outcome: Adequate for Discharge   Problem: Nutrition: Goal: Adequate nutrition will be maintained Outcome: Adequate for Discharge   Problem: Coping: Goal: Level of anxiety will decrease Outcome: Adequate for Discharge   Problem: Elimination: Goal: Will not experience complications  related to bowel motility Outcome: Adequate for Discharge Goal: Will not experience complications related to urinary retention Outcome: Adequate for Discharge   Problem: Pain Managment: Goal: General experience of comfort will improve Outcome: Adequate for Discharge   Problem: Safety: Goal: Ability to remain free from injury will improve Outcome: Adequate for Discharge   Problem: Skin Integrity: Goal: Risk for impaired skin integrity will decrease Outcome: Adequate for Discharge   Problem: Education: Goal: Ability to describe self-care measures that may prevent or decrease complications (Diabetes Survival Skills Education) will improve Outcome: Adequate for Discharge Goal: Individualized Educational Video(s) Outcome: Adequate for Discharge   Problem: Coping: Goal: Ability to adjust to condition or change in health will improve Outcome: Adequate for Discharge   Problem: Fluid Volume: Goal: Ability to maintain a balanced intake and output will improve Outcome: Adequate for Discharge   Problem: Health Behavior/Discharge Planning: Goal: Ability to identify and utilize available resources and services will improve Outcome: Adequate for Discharge Goal: Ability to manage health-related needs will improve Outcome: Adequate for Discharge   Problem: Metabolic: Goal: Ability to maintain appropriate glucose levels will improve Outcome: Adequate for Discharge   Problem: Nutritional: Goal: Maintenance of adequate nutrition will improve Outcome: Adequate for Discharge Goal: Progress toward achieving an optimal weight will improve Outcome: Adequate for Discharge   Problem: Skin Integrity: Goal: Risk for impaired skin integrity will decrease Outcome: Adequate for Discharge   Problem: Tissue Perfusion: Goal: Adequacy of tissue perfusion will improve Outcome: Adequate for Discharge   Problem: Education: Goal: Knowledge of the prescribed therapeutic regimen will improve Outcome:  Adequate for Discharge Goal: Ability to verbalize activity precautions or restrictions will improve Outcome: Adequate for Discharge Goal: Understanding of discharge needs will improve Outcome: Adequate for Discharge   Problem: Activity: Goal: Ability to perform//tolerate increased activity and mobilize with assistive devices will improve Outcome: Adequate for Discharge   Problem: Clinical Measurements: Goal: Postoperative complications will be avoided or minimized Outcome: Adequate for Discharge   Problem: Self-Care: Goal: Ability to  meet self-care needs will improve Outcome: Adequate for Discharge   Problem: Self-Concept: Goal: Ability to maintain and perform role responsibilities to the fullest extent possible will improve Outcome: Adequate for Discharge   Problem: Pain Management: Goal: Pain level will decrease with appropriate interventions Outcome: Adequate for Discharge   Problem: Skin Integrity: Goal: Demonstration of wound healing without infection will improve Outcome: Adequate for Discharge

## 2023-09-22 NOTE — Discharge Instructions (Signed)
Keep dressings clean dry and intact until follow-up with Dr. Logan Bores in 1 week.  Weightbearing as tolerated mostly for transition purposes only in a surgical shoe.

## 2023-09-22 NOTE — Progress Notes (Signed)
PROGRESS NOTE    Raymond Walker.  FAO:[id number] DOB: 1951-09-23 DOA: 09/20/2023 PCP: Babs Sciara, MD   Brief Narrative: 72 y.o. male with medical history significant of caregiver fatigue, COPD, equinus and metatarsal deformity, ED, nephrolithiasis, childhood seizures, hypertension, glucose intolerance, recurrent pancreatitis, history of pneumonia, thrombocytopenia, tobacco abuse, peripheral arterial disease in remission according to the patient who was referred by podiatry due to progressive worsening of his left foot wound which will require surgical amputation despite undergoing external iliac artery stenting initially with 90% stenosis and 0% residual with a palpable femoral pulse after procedure with vascular surgery 08/26/23  MRI of left foot: 08/23/23- showed acute osteomyelitis of the first metatarsal head and base of the great toe proximal phalanx.   Assessment & Plan:   Principal Problem:   Osteomyelitis of left foot (HCC) Active Problems:   Tobacco abuse   Aortic atherosclerosis (HCC)   Coronary artery calcification seen on CAT scan   COPD (chronic obstructive pulmonary disease) (HCC)   PAD (peripheral artery disease) (HCC)   Essential hypertension   GERD (gastroesophageal reflux disease)   osteomyelitis of left foot in the setting of peripheral artery disease and status post recent external iliac artery stenting.   Status post first ray amputation left foot sent for pathology and culture Unfortunately independent living cannot take him back due to recent surgery PT OT consulted Doxycycline 100 twice daily for 7 days start aspirin and Plavix and statin Continue statin    Tobacco abuse-in remission.  Supportive care     Aortic atherosclerosis continue Crestor      Coronary artery calcification seen on CAT scan.  Continue aspirin Plavix statin     COPD (chronic obstructive pulmonary disease) (HCC) Continue Symbicort or formulary equivalent.     Essential  hypertension Not on antihypertensives.     GERD continue PPI   Estimated body mass index is 20.95 kg/m as calculated from the following:   Height as of 09/11/23: 5\' 11"  (1.803 m).   Weight as of 09/11/23: 68.1 kg.  DVT prophylaxis:lovenox Code Status:full Family Communication:none Disposition Plan:  Status is: Inpatient Remains inpatient appropriate because: OM ON iv Antibiotics   Consultants:   Dr Logan Bores   Procedures:left foot first ray amputation Antimicrobials: Anti-infectives (From admission, onward)    Start     Dose/Rate Route Frequency Ordered Stop   09/22/23 1530  doxycycline (VIBRA-TABS) tablet 100 mg        100 mg Oral Every 12 hours 09/22/23 1434     09/22/23 0000  doxycycline (ADOXA) 100 MG tablet  Status:  Discontinued        100 mg Oral 2 times daily 09/22/23 1227 09/22/23    09/22/23 0000  doxycycline (ADOXA) 100 MG tablet        100 mg Oral 2 times daily 09/22/23 1312     09/21/23 1515  vancomycin (VANCOREADY) IVPB 1250 mg/250 mL       Placed in "Followed by" Linked Group   1,250 mg 166.7 mL/hr over 90 Minutes Intravenous Every 24 hours 09/20/23 1440     09/20/23 2200  doxycycline (VIBRA-TABS) tablet 100 mg  Status:  Discontinued        100 mg Oral Every 12 hours 09/20/23 1853 09/20/23 1906   09/20/23 1515  vancomycin (VANCOREADY) IVPB 1500 mg/300 mL       Placed in "Followed by" Linked Group   1,500 mg 150 mL/hr over 120 Minutes Intravenous  Once 09/20/23 1440 09/20/23 1824  09/20/23 1430  cefTRIAXone (ROCEPHIN) 1 g in sodium chloride 0.9 % 100 mL IVPB        1 g 200 mL/hr over 30 Minutes Intravenous Every 24 hours 09/20/23 1421 09/27/23 1429   09/20/23 1430  metroNIDAZOLE (FLAGYL) IVPB 500 mg        500 mg 100 mL/hr over 60 Minutes Intravenous Every 8 hours 09/20/23 1421 09/27/23 1429       Subjective:   He is anxious to leave discussed with Dr. Events dressings changed this morning media pictures noted with clean intact sutures with scant  drainage   objective: Vitals:   09/22/23 0227 09/22/23 0543 09/22/23 0956 09/22/23 1447  BP: (!) 123/56 115/65 104/64 (!) 157/70  Pulse: 71 68 73 72  Resp: 18 18 16 16   Temp: 98.7 F (37.1 C) 98.3 F (36.8 C) 97.7 F (36.5 C) 98 F (36.7 C)  TempSrc: Oral Oral Oral Oral  SpO2: 97% 97% 100% 100%    Intake/Output Summary (Last 24 hours) at 09/22/2023 1521 Last data filed at 09/22/2023 1451 Gross per 24 hour  Intake 640 ml  Output 300 ml  Net 340 ml   There were no vitals filed for this visit.  Examination:  General exam: Appears in nad Respiratory system: Clear to auscultation. Respiratory effort normal. Cardiovascular system: S1 & S2 heard, RRR. No JVD, murmurs, rubs, gallops or clicks. No pedal edema. Gastrointestinal system: Abdomen is nondistended, soft and nontender. No organomegaly or masses felt. Normal bowel sounds heard. Central nervous system: Alert and oriented. Extremities: Left foot is covered with dressing   Data Reviewed: I have personally reviewed following labs and imaging studies  CBC: Recent Labs  Lab 09/20/23 1358 09/21/23 0511 09/22/23 0508  WBC 8.6 5.6 4.6  NEUTROABS 7.0  --   --   HGB 12.9* 10.4* 10.0*  HCT 41.8 33.2* 32.8*  MCV 93.3 92.2 93.7  PLT 195 146* 149*   Basic Metabolic Panel: Recent Labs  Lab 09/20/23 1358 09/21/23 0511 09/22/23 0508  NA 139 137 138  K 4.6 3.6 3.5  CL 101 100 103  CO2 27 26 26   GLUCOSE 142* 99 118*  BUN 21 20 18   CREATININE 1.14 0.97 0.87  CALCIUM 9.6 8.8* 8.5*   GFR: Estimated Creatinine Clearance: 72.8 mL/min (by C-G formula based on SCr of 0.87 mg/dL). Liver Function Tests: Recent Labs  Lab 09/20/23 1358 09/21/23 0511 09/22/23 0508  AST 18 11* 12*  ALT 19 13 11   ALKPHOS 105 78 70  BILITOT 0.7 0.5 0.6  PROT 8.9* 6.5 6.4*  ALBUMIN 3.8 3.0* 2.8*   No results for input(s): "LIPASE", "AMYLASE" in the last 168 hours. No results for input(s): "AMMONIA" in the last 168 hours. Coagulation  Profile: Recent Labs  Lab 09/20/23 1358  INR 1.2   Cardiac Enzymes: No results for input(s): "CKTOTAL", "CKMB", "CKMBINDEX", "TROPONINI" in the last 168 hours. BNP (last 3 results) No results for input(s): "PROBNP" in the last 8760 hours. HbA1C: No results for input(s): "HGBA1C" in the last 72 hours. CBG: Recent Labs  Lab 09/20/23 2213 09/21/23 0733 09/21/23 1123 09/21/23 1628  GLUCAP 119* 108* 99 149*   Lipid Profile: No results for input(s): "CHOL", "HDL", "LDLCALC", "TRIG", "CHOLHDL", "LDLDIRECT" in the last 72 hours. Thyroid Function Tests: No results for input(s): "TSH", "T4TOTAL", "FREET4", "T3FREE", "THYROIDAB" in the last 72 hours. Anemia Panel: No results for input(s): "VITAMINB12", "FOLATE", "FERRITIN", "TIBC", "IRON", "RETICCTPCT" in the last 72 hours. Sepsis Labs: No results for  input(s): "PROCALCITON", "LATICACIDVEN" in the last 168 hours.  Recent Results (from the past 240 hour(s))  Surgical pcr screen     Status: None   Collection Time: 09/21/23  6:23 AM   Specimen: Nasal Mucosa; Nasal Swab  Result Value Ref Range Status   MRSA, PCR NEGATIVE NEGATIVE Final   Staphylococcus aureus NEGATIVE NEGATIVE Final    Comment: (NOTE) The Xpert SA Assay (FDA approved for NASAL specimens in patients 36 years of age and older), is one component of a comprehensive surveillance program. It is not intended to diagnose infection nor to guide or monitor treatment. Performed at [facility], [address] [place], [place] [address]   Aerobic/Anaerobic Culture w Gram Stain (surgical/deep wound)     Status: None (Preliminary result)   Collection Time: 09/21/23 12:47 PM   Specimen: PATH Digit amputation; Tissue  Result Value Ref Range Status   Specimen Description   Final    TOE LEFT FOOT FIRST METATARSAL Performed at [facility], [address] [place], [place] [address]    Special Requests   Final    NONE Performed at [facility], [address] [place], [place] [address]    Gram Stain NO WBC SEEN NO ORGANISMS SEEN   Final   Culture   Final    NO GROWTH < 24 HOURS Performed at [facility], [address] [place], [place] [address]    Report Status PENDING  Incomplete     Radiology Studies: DG Foot Complete Left  Result Date: 09/21/2023 CLINICAL DATA:  Status post surgical amputation. Postoperative check. EXAM: LEFT FOOT - COMPLETE 3+ VIEW COMPARISON:  Left foot radiographs 08/22/2023, MRI left forefoot 08/23/2023 FINDINGS: Interval amputation of the first digit to the level of the proximal shaft of the metatarsal. Expected mild postoperative more distal subcutaneous air. There are distal medial forefoot surgical skin staples. No unexpected new nonsurgical cortical defect. Tiny plantar calcaneal heel spur. IMPRESSION: Interval amputation of the first digit to the level of the proximal shaft of the metatarsal. Electronically Signed   By: Neita Garnet M.D.   On: 09/21/2023 19:03    Scheduled Meds:  azelastine  2 spray Each Nare BID   doxycycline  100 mg Oral Q12H   enoxaparin (LOVENOX) injection  40 mg Subcutaneous Q24H   insulin aspart  0-15 Units Subcutaneous TID WC   mometasone-formoterol  2 puff Inhalation BID   pantoprazole  40 mg Oral Daily   rosuvastatin  20 mg Oral Daily   Continuous Infusions:  cefTRIAXone (ROCEPHIN)  IV 1 g (09/21/23 1642)   metronidazole 500 mg (09/22/23 0520)   vancomycin 1,250 mg (09/21/23 1746)     LOS: 2 days    Time spent: 39 min  Alwyn Ren, MD 09/22/2023, 3:21 PM

## 2023-09-22 NOTE — TOC Progression Note (Addendum)
Transition of Care (TOC) - Progression Note    Patient Details  Name: Raymond Walker. MRN: [id number] Date of Birth: 10/18/1951  Transition of Care [facility]) CM/SW Contact  Georgie Chard, LCSW Phone Number: 09/22/2023, 2:04 PM  Clinical Narrative:    CSW spoke to the patients Niece who states that the patient currently lives at ALF [facility]. At this time the ALF will not take the patient back due to them feeling like he needs SNF due to recent surgery. Tamie the director has reported to this CSW that once the patient get's out of SNF can return. Tamie has also requested that she be informed of the facility that the patient is going to once family decides. Tamie can be reached at [phone]. This CSW will reach back out to the patient's niece to update her and explain SNF process. At this time PT / OT will need to evaluate the patient. TOC will continue to follow and update as updates come.   Addend@ 2:09 pm  This CSW spoke to patient's niece she is aware that PT/OT has to see patient. TOC will reach out to niece once SNF process is started and bed offers are available.         Expected Discharge Plan and Services         Expected Discharge Date: 09/22/23                                     Social Determinants of Health (SDOH) Interventions SDOH Screenings   Food Insecurity: Food Insecurity Present (09/20/2023)  Housing: Low Risk  (09/20/2023)  Transportation Needs: No Transportation Needs (09/20/2023)  Utilities: Not At Risk (09/20/2023)  Alcohol Screen: Low Risk  (09/19/2021)  Depression (PHQ2-9): Medium Risk (09/11/2023)  Financial Resource Strain: High Risk (03/13/2023)  Physical Activity: Unknown (03/13/2023)  Social Connections: Socially Isolated (03/13/2023)  Stress: No Stress Concern Present (03/13/2023)  Tobacco Use: High Risk (09/20/2023)    Readmission Risk Interventions    12/12/2021    2:38 PM  Readmission Risk Prevention Plan  Transportation Screening  Complete  PCP or Specialist Appt within 5-7 Days Complete  Home Care Screening Complete  Medication Review (RN CM) Complete

## 2023-09-22 NOTE — Progress Notes (Signed)
   Subjective: 1 Day Post-Op Procedure(s) (LRB): AMPUTATION DIGIT (Left) DOS: 09/21/2023  Objective: Vital signs in last 24 hours: Temp:  [97.5 F (36.4 C)-98.7 F (37.1 C)] 97.7 F (36.5 C) (10/06 0956) Pulse Rate:  [68-98] 73 (10/06 0956) Resp:  [16-28] 16 (10/06 0956) BP: (91-137)/(51-97) 104/64 (10/06 0956) SpO2:  [91 %-100 %] 100 % (10/06 0956)  Recent Labs    09/20/23 1358 09/21/23 0511 09/22/23 0508  HGB 12.9* 10.4* 10.0*   Recent Labs    09/21/23 0511 09/22/23 0508  WBC 5.6 4.6  RBC 3.60* 3.50*  HCT 33.2* 32.8*  PLT 146* 149*   Recent Labs    09/21/23 0511 09/22/23 0508  NA 137 138  K 3.6 3.5  CL 100 103  CO2 26 26  BUN 20 18  CREATININE 0.97 0.87  GLUCOSE 99 118*  CALCIUM 8.8* 8.5*   Recent Labs    09/20/23 1358  INR 1.2      LT foot POD#1 09/22/2023  Physical Exam: Sutures and staples intact. No dehiscence. Scant sanginous drainage.  Overall amputation site appears stable with good potential for healing.  Assessment/Plan: 1 Day Post-Op Procedure(s) (LRB): AMPUTATION DIGIT (Left) DOS: 09/21/2023  -Dressings changed.  Leave clean dry and intact until follow-up in the office 1 week -Weightbearing as tolerated mostly for transition purposes only in a surgical shoe.  Patient has a surgical shoe in his room -Recommend DC oral antibiotics x 5 to 7 days.  Will adjust in office as needed -Okay for discharge from a podiatry standpoint.  Will sign off   Felecia Shelling 09/22/2023, 11:36 AM  Felecia Shelling, DPM [facility]  Dr. Felecia Shelling, DPM    [address] [place], [place] [address]                Office [phone]  Fax [phone]

## 2023-09-23 ENCOUNTER — Inpatient Hospital Stay (HOSPITAL_COMMUNITY): Payer: Medicare HMO

## 2023-09-23 ENCOUNTER — Inpatient Hospital Stay: Payer: Medicare HMO | Admitting: Internal Medicine

## 2023-09-23 DIAGNOSIS — I7090 Unspecified atherosclerosis: Secondary | ICD-10-CM

## 2023-09-23 DIAGNOSIS — M86172 Other acute osteomyelitis, left ankle and foot: Secondary | ICD-10-CM | POA: Diagnosis not present

## 2023-09-23 LAB — VAS US ABI WITH/WO TBI
Left ABI: 1.13
Right ABI: 0.33

## 2023-09-23 MED ORDER — VITAMIN C 500 MG PO TABS
500.0000 mg | ORAL_TABLET | Freq: Two times a day (BID) | ORAL | Status: DC
  Administered 2023-09-23 – 2023-09-26 (×6): 500 mg via ORAL
  Filled 2023-09-23 (×6): qty 1

## 2023-09-23 MED ORDER — ENSURE ENLIVE PO LIQD
237.0000 mL | Freq: Two times a day (BID) | ORAL | Status: DC
  Administered 2023-09-23 – 2023-09-26 (×6): 237 mL via ORAL

## 2023-09-23 MED ORDER — ZINC SULFATE 220 (50 ZN) MG PO CAPS
220.0000 mg | ORAL_CAPSULE | Freq: Every day | ORAL | Status: DC
  Administered 2023-09-23: 220 mg via ORAL
  Filled 2023-09-23 (×4): qty 1

## 2023-09-23 NOTE — Progress Notes (Signed)
Orthopedic Tech Progress Note Patient Details:  Raymond Walker 25-Aug-1951 [id number]  Ortho Devices Type of Ortho Device: CAM walker Ortho Device/Splint Location: LLE Ortho Device/Splint Interventions: Ordered   Post Interventions Instructions Provided: Care of device, Adjustment of device Patient currently wearing POS from previous surgery, CAM dropped off at bedside. Darleen Crocker 09/23/2023, 10:26 AM

## 2023-09-23 NOTE — Progress Notes (Signed)
input(s): "PROCALCITON", "LATICACIDVEN" in the last 168 hours.  Recent Results (from the past 240 hour(s))  Surgical pcr screen     Status: None   Collection Time: 09/21/23  6:23 AM   Specimen: Nasal Mucosa; Nasal Swab  Result Value Ref Range Status   MRSA, PCR NEGATIVE NEGATIVE Final   Staphylococcus aureus NEGATIVE NEGATIVE Final    Comment: (NOTE) The Xpert SA Assay (FDA approved for NASAL specimens in patients 73 years of age and older), is one component of a comprehensive surveillance program. It is not intended to diagnose infection nor to guide or monitor treatment. Performed at [facility], [address] [place], [place] [address]   Aerobic/Anaerobic Culture w Gram Stain (surgical/deep wound)     Status: None (Preliminary result)   Collection Time: 09/21/23 12:47 PM   Specimen: PATH Digit amputation; Tissue  Result Value Ref Range Status   Specimen Description   Final    TOE LEFT FOOT FIRST METATARSAL Performed at [facility], [address] [place], [place] [address]    Special Requests   Final    NONE Performed at [facility], [address] [place], [place] [address]    Gram Stain NO WBC SEEN NO ORGANISMS SEEN   Final   Culture   Final    NO GROWTH 2 DAYS NO ANAEROBES ISOLATED; CULTURE IN PROGRESS FOR 5 DAYS Performed at [facility], [address] [place], [place] [address]    Report Status PENDING  Incomplete     Radiology Studies: VAS [place] ABI WITH/WO TBI  Result Date: 09/23/2023  LOWER EXTREMITY DOPPLER STUDY Patient Name:  Raymond Walker.  Date of Exam:   09/23/2023 Medical Rec #: [id number]           Accession #:    [id number] Date of Birth: 02-11-1951            Patient Gender: M Patient Age:   76 years Exam Location:  [facility] Procedure:      VAS [place] ABI WITH/WO TBI Referring Phys: DAVID ORTIZ --------------------------------------------------------------------------------  Indications: Peripheral artery disease. High Risk Factors: Hypertension.  Vascular Interventions: 08/27/23 LT CFA-POP BPG. 09/21/23 LT Digit Amputation. Limitations: Today's exam was limited due to involuntary patient movement. Comparison Study: Significant changes seen since prior exam 08/25/23 Performing Technologist: Shona Simpson  Examination Guidelines: A complete evaluation includes at minimum, Doppler waveform signals and systolic blood pressure reading at the level of bilateral brachial, anterior tibial, and posterior tibial arteries, when vessel segments are accessible. Bilateral testing is considered an integral part of a complete examination. Photoelectric Plethysmograph (PPG) waveforms and toe systolic pressure readings are included as required and additional duplex testing as needed. Limited examinations for reoccurring indications may be performed as noted.  ABI Findings: +---------+------------------+-----+-------------------+----------+ Right    Rt Pressure (mmHg)IndexWaveform           Comment    +---------+------------------+-----+-------------------+----------+ Brachial 93                      dampened monophasic           +---------+------------------+-----+-------------------+----------+ PTA      49                0.33 dampened monophasic           +---------+------------------+-----+-------------------+----------+ DP  PROGRESS NOTE    [name].  ZOX:[id number] DOB: [birth year] DOA: 09/20/2023 PCP: Babs Sciara, MD   Brief Narrative: 72 y.o. male with medical history significant of caregiver fatigue, COPD, equinus and metatarsal deformity, ED, nephrolithiasis, childhood seizures, hypertension, glucose intolerance, recurrent pancreatitis, history of pneumonia, thrombocytopenia, tobacco abuse, peripheral arterial disease in remission according to the patient who was referred by podiatry due to progressive worsening of his left foot wound which will require surgical amputation despite undergoing external iliac artery stenting initially with 90% stenosis and 0% residual with a palpable femoral pulse after procedure with vascular surgery 08/26/23  MRI of left foot: 08/23/23- showed acute osteomyelitis of the first metatarsal head and base of the great toe proximal phalanx.   Assessment & Plan:   Principal Problem:   Osteomyelitis of left foot (HCC) Active Problems:   Tobacco abuse   Aortic atherosclerosis (HCC)   Coronary artery calcification seen on CAT scan   COPD (chronic obstructive pulmonary disease) (HCC)   PAD (peripheral artery disease) (HCC)   Essential hypertension   GERD (gastroesophageal reflux disease)   osteomyelitis of left foot in the setting of peripheral artery disease and status post recent external iliac artery stenting.   Status post first ray amputation left foot sent for pathology and culture Unfortunately independent living cannot take him back due to recent surgery PT OT consulted Doxycycline 100 twice daily for 7 days start aspirin and Plavix and statin Continue statin    Tobacco abuse-in remission.  Supportive care     Aortic atherosclerosis continue Crestor      Coronary artery calcification seen on CAT scan.  Continue aspirin Plavix statin     COPD (chronic obstructive pulmonary disease) (HCC) Continue Symbicort or formulary equivalent.     Essential  hypertension Not on antihypertensives.     GERD continue PPI   Estimated body mass index is 20.95 kg/m as calculated from the following:   Height as of 09/11/23: 5\' 11"  (1.803 m).   Weight as of 09/11/23: 68.1 kg.  DVT prophylaxis:lovenox Code Status:full Family Communication:none Disposition Plan:  Status is: Inpatient Remains inpatient appropriate because: OM ON iv Antibiotics   Consultants:   Dr Logan Bores   Procedures:left foot first ray amputation Antimicrobials: Anti-infectives (From admission, onward)    Start     Dose/Rate Route Frequency Ordered Stop   09/22/23 1530  doxycycline (VIBRA-TABS) tablet 100 mg        100 mg Oral Every 12 hours 09/22/23 1434     09/22/23 0000  doxycycline (ADOXA) 100 MG tablet  Status:  Discontinued        100 mg Oral 2 times daily 09/22/23 1227 09/22/23    09/22/23 0000  doxycycline (ADOXA) 100 MG tablet        100 mg Oral 2 times daily 09/22/23 1312     09/21/23 1515  vancomycin (VANCOREADY) IVPB 1250 mg/250 mL  Status:  Discontinued       Placed in "Followed by" Linked Group   1,250 mg 166.7 mL/hr over 90 Minutes Intravenous Every 24 hours 09/20/23 1440 09/22/23 1945   09/20/23 2200  doxycycline (VIBRA-TABS) tablet 100 mg  Status:  Discontinued        100 mg Oral Every 12 hours 09/20/23 1853 09/20/23 1906   09/20/23 1515  vancomycin (VANCOREADY) IVPB 1500 mg/300 mL       Placed in "Followed by" Linked Group   1,500 mg 150 mL/hr over 120 Minutes Intravenous  Once 09/20/23  PROGRESS NOTE    [name].  ZOX:[id number] DOB: [birth year] DOA: 09/20/2023 PCP: Babs Sciara, MD   Brief Narrative: 72 y.o. male with medical history significant of caregiver fatigue, COPD, equinus and metatarsal deformity, ED, nephrolithiasis, childhood seizures, hypertension, glucose intolerance, recurrent pancreatitis, history of pneumonia, thrombocytopenia, tobacco abuse, peripheral arterial disease in remission according to the patient who was referred by podiatry due to progressive worsening of his left foot wound which will require surgical amputation despite undergoing external iliac artery stenting initially with 90% stenosis and 0% residual with a palpable femoral pulse after procedure with vascular surgery 08/26/23  MRI of left foot: 08/23/23- showed acute osteomyelitis of the first metatarsal head and base of the great toe proximal phalanx.   Assessment & Plan:   Principal Problem:   Osteomyelitis of left foot (HCC) Active Problems:   Tobacco abuse   Aortic atherosclerosis (HCC)   Coronary artery calcification seen on CAT scan   COPD (chronic obstructive pulmonary disease) (HCC)   PAD (peripheral artery disease) (HCC)   Essential hypertension   GERD (gastroesophageal reflux disease)   osteomyelitis of left foot in the setting of peripheral artery disease and status post recent external iliac artery stenting.   Status post first ray amputation left foot sent for pathology and culture Unfortunately independent living cannot take him back due to recent surgery PT OT consulted Doxycycline 100 twice daily for 7 days start aspirin and Plavix and statin Continue statin    Tobacco abuse-in remission.  Supportive care     Aortic atherosclerosis continue Crestor      Coronary artery calcification seen on CAT scan.  Continue aspirin Plavix statin     COPD (chronic obstructive pulmonary disease) (HCC) Continue Symbicort or formulary equivalent.     Essential  hypertension Not on antihypertensives.     GERD continue PPI   Estimated body mass index is 20.95 kg/m as calculated from the following:   Height as of 09/11/23: 5\' 11"  (1.803 m).   Weight as of 09/11/23: 68.1 kg.  DVT prophylaxis:lovenox Code Status:full Family Communication:none Disposition Plan:  Status is: Inpatient Remains inpatient appropriate because: OM ON iv Antibiotics   Consultants:   Dr Logan Bores   Procedures:left foot first ray amputation Antimicrobials: Anti-infectives (From admission, onward)    Start     Dose/Rate Route Frequency Ordered Stop   09/22/23 1530  doxycycline (VIBRA-TABS) tablet 100 mg        100 mg Oral Every 12 hours 09/22/23 1434     09/22/23 0000  doxycycline (ADOXA) 100 MG tablet  Status:  Discontinued        100 mg Oral 2 times daily 09/22/23 1227 09/22/23    09/22/23 0000  doxycycline (ADOXA) 100 MG tablet        100 mg Oral 2 times daily 09/22/23 1312     09/21/23 1515  vancomycin (VANCOREADY) IVPB 1250 mg/250 mL  Status:  Discontinued       Placed in "Followed by" Linked Group   1,250 mg 166.7 mL/hr over 90 Minutes Intravenous Every 24 hours 09/20/23 1440 09/22/23 1945   09/20/23 2200  doxycycline (VIBRA-TABS) tablet 100 mg  Status:  Discontinued        100 mg Oral Every 12 hours 09/20/23 1853 09/20/23 1906   09/20/23 1515  vancomycin (VANCOREADY) IVPB 1500 mg/300 mL       Placed in "Followed by" Linked Group   1,500 mg 150 mL/hr over 120 Minutes Intravenous  Once 09/20/23  PROGRESS NOTE    [name].  ZOX:[id number] DOB: [birth year] DOA: 09/20/2023 PCP: Babs Sciara, MD   Brief Narrative: 72 y.o. male with medical history significant of caregiver fatigue, COPD, equinus and metatarsal deformity, ED, nephrolithiasis, childhood seizures, hypertension, glucose intolerance, recurrent pancreatitis, history of pneumonia, thrombocytopenia, tobacco abuse, peripheral arterial disease in remission according to the patient who was referred by podiatry due to progressive worsening of his left foot wound which will require surgical amputation despite undergoing external iliac artery stenting initially with 90% stenosis and 0% residual with a palpable femoral pulse after procedure with vascular surgery 08/26/23  MRI of left foot: 08/23/23- showed acute osteomyelitis of the first metatarsal head and base of the great toe proximal phalanx.   Assessment & Plan:   Principal Problem:   Osteomyelitis of left foot (HCC) Active Problems:   Tobacco abuse   Aortic atherosclerosis (HCC)   Coronary artery calcification seen on CAT scan   COPD (chronic obstructive pulmonary disease) (HCC)   PAD (peripheral artery disease) (HCC)   Essential hypertension   GERD (gastroesophageal reflux disease)   osteomyelitis of left foot in the setting of peripheral artery disease and status post recent external iliac artery stenting.   Status post first ray amputation left foot sent for pathology and culture Unfortunately independent living cannot take him back due to recent surgery PT OT consulted Doxycycline 100 twice daily for 7 days start aspirin and Plavix and statin Continue statin    Tobacco abuse-in remission.  Supportive care     Aortic atherosclerosis continue Crestor      Coronary artery calcification seen on CAT scan.  Continue aspirin Plavix statin     COPD (chronic obstructive pulmonary disease) (HCC) Continue Symbicort or formulary equivalent.     Essential  hypertension Not on antihypertensives.     GERD continue PPI   Estimated body mass index is 20.95 kg/m as calculated from the following:   Height as of 09/11/23: 5\' 11"  (1.803 m).   Weight as of 09/11/23: 68.1 kg.  DVT prophylaxis:lovenox Code Status:full Family Communication:none Disposition Plan:  Status is: Inpatient Remains inpatient appropriate because: OM ON iv Antibiotics   Consultants:   Dr Logan Bores   Procedures:left foot first ray amputation Antimicrobials: Anti-infectives (From admission, onward)    Start     Dose/Rate Route Frequency Ordered Stop   09/22/23 1530  doxycycline (VIBRA-TABS) tablet 100 mg        100 mg Oral Every 12 hours 09/22/23 1434     09/22/23 0000  doxycycline (ADOXA) 100 MG tablet  Status:  Discontinued        100 mg Oral 2 times daily 09/22/23 1227 09/22/23    09/22/23 0000  doxycycline (ADOXA) 100 MG tablet        100 mg Oral 2 times daily 09/22/23 1312     09/21/23 1515  vancomycin (VANCOREADY) IVPB 1250 mg/250 mL  Status:  Discontinued       Placed in "Followed by" Linked Group   1,250 mg 166.7 mL/hr over 90 Minutes Intravenous Every 24 hours 09/20/23 1440 09/22/23 1945   09/20/23 2200  doxycycline (VIBRA-TABS) tablet 100 mg  Status:  Discontinued        100 mg Oral Every 12 hours 09/20/23 1853 09/20/23 1906   09/20/23 1515  vancomycin (VANCOREADY) IVPB 1500 mg/300 mL       Placed in "Followed by" Linked Group   1,500 mg 150 mL/hr over 120 Minutes Intravenous  Once 09/20/23

## 2023-09-23 NOTE — Evaluation (Signed)
Physical Therapy Evaluation Patient Details Name: Raymond Walker. MRN: [id number] DOB: May 02, 1951 Today's Date: 09/23/2023  History of Present Illness  72 y.o. male with medical history significant of caregiver fatigue, COPD, equinus and metatarsal deformity, ED, nephrolithiasis, childhood seizures, hypertension, glucose intolerance, recurrent pancreatitis, history of pneumonia, thrombocytopenia, tobacco abuse, peripheral arterial disease in remission according to the patient who was referred by podiatry due to progressive worsening of his left foot wound which will require surgical amputation despite undergoing external iliac artery stenting initially with 90% stenosis and 0% residual with a palpable femoral pulse after procedure with vascular surgery 08/26/23   MRI of left foot: 08/23/23- showed acute osteomyelitis of the first metatarsal head and base of the great toe proximal phalanx.  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  Clinical Impression  Pt is mobilizing well, he ambulated 120' with RW, no loss of balance, good adherence to 50% PWB status. Pt did not require physical assistance for bed mobility, transfers, or ambulation. He is mobilizing well enough to return to ILF. He states he has a WC with elevating leg rests he can use for longer distances.         If plan is discharge home, recommend the following: A little help with bathing/dressing/bathroom;Direct supervision/assist for medications management;Direct supervision/assist for financial management;Assist for transportation   Can travel by private vehicle        Equipment Recommendations None recommended by PT  Recommendations for Other Services       Functional Status Assessment Patient has had a recent decline in their functional status and demonstrates the ability to make significant improvements in function in a reasonable and predictable amount of time.     Precautions / Restrictions Precautions Precautions:  Fall Precaution Comments: 50% PWB with CAM boot; pt denies h/o falls in past 6 months Required Braces or Orthoses: Other Brace Other Brace: CAM boot (boot not in room, called [employer] to request delivery) Restrictions LLE Weight Bearing: Partial weight bearing LLE Partial Weight Bearing Percentage or Pounds: 50% with CAM boot      Mobility  Bed Mobility Overal bed mobility: Modified Independent Bed Mobility: Supine to Sit     Supine to sit: Used rails, HOB elevated, Modified independent (Device/Increase time)          Transfers Overall transfer level: Needs assistance Equipment used: Rolling walker (2 wheels) Transfers: Sit to/from Stand Sit to Stand: Supervision           General transfer comment: VCs safe hand placement    Ambulation/Gait Ambulation/Gait assistance: Contact guard assist Gait Distance (Feet): 120 Feet Assistive device: Rolling walker (2 wheels) Gait Pattern/deviations: Step-to pattern, Decreased step length - right, Decreased step length - left, Trunk flexed Gait velocity: decreased     General Gait Details: VCs for posture, distance limited by fatigue, no loss of balance, good adherence to 50% PWB, pt WBing on L heel; pt wore post op shoe on L foot as no CAM boot in room  Stairs            Wheelchair Mobility     Tilt Bed    Modified Rankin (Stroke Patients Only)       Balance Overall balance assessment: Modified Independent Sitting-balance support: Feet supported, No upper extremity supported Sitting balance-Leahy Scale: Good     Standing balance support: Bilateral upper extremity supported, During functional activity, Reliant on assistive device for balance Standing balance-Leahy Scale: Fair  Pertinent Vitals/Pain Pain Assessment Pain Assessment: No/denies pain Pain Score: 0-No pain    Home Living Family/patient expects to be discharged to:: Other (Comment) Living  Arrangements: Alone   Type of Home: Independent living facility Home Access: Level entry       Home Layout: One level Home Equipment: Scientist, research (medical) (4 wheels);Grab bars - tub/shower Additional Comments: pt reports he is at an ILF; reports WC has elevating leg rest    Prior Function Prior Level of Function : Independent/Modified Independent             Mobility Comments: reports no AD for short distances, w/c for longer distances. ADLs Comments: goes to dining hall for meals, facility does meds     Extremity/Trunk Assessment   Upper Extremity Assessment Upper Extremity Assessment: Overall WFL for tasks assessed    Lower Extremity Assessment Lower Extremity Assessment: Overall WFL for tasks assessed LLE Deficits / Details: knee ext 5/5, reports sensation intact to light touch L toes    Cervical / Trunk Assessment Cervical / Trunk Assessment: Kyphotic  Communication   Communication Communication: No apparent difficulties  Cognition Arousal: Alert Behavior During Therapy: WFL for tasks assessed/performed Overall Cognitive Status: Within Functional Limits for tasks assessed                                          General Comments      Exercises     Assessment/Plan    PT Assessment Patient needs continued PT services  PT Problem List Decreased activity tolerance;Decreased mobility       PT Treatment Interventions DME instruction;Gait training;Functional mobility training;Therapeutic activities;Patient/family education    PT Goals (Current goals can be found in the Care Plan section)  Acute Rehab PT Goals Patient Stated Goal: to return to ILF PT Goal Formulation: With patient Time For Goal Achievement: 10/07/23 Potential to Achieve Goals: Good    Frequency Min 1X/week     Co-evaluation               AM-PAC PT "6 Clicks" Mobility  Outcome Measure Help needed turning from your back to your side while in a flat bed  without using bedrails?: None Help needed moving from lying on your back to sitting on the side of a flat bed without using bedrails?: A Little Help needed moving to and from a bed to a chair (including a wheelchair)?: None Help needed standing up from a chair using your arms (e.g., wheelchair or bedside chair)?: None Help needed to walk in hospital room?: None Help needed climbing 3-5 steps with a railing? : A Little 6 Click Score: 22    End of Session Equipment Utilized During Treatment: Gait belt Activity Tolerance: Patient tolerated treatment well Patient left: in bed;with call bell/phone within reach Nurse Communication: Mobility status PT Visit Diagnosis: Difficulty in walking, not elsewhere classified (R26.2)    Time: 2440-1027 PT Time Calculation (min) (ACUTE ONLY): 12 min   Charges:   PT Evaluation $PT Eval Moderate Complexity: 1 Mod   PT General Charges $$ ACUTE PT VISIT: 1 Visit         Tamala Ser PT 09/23/2023  Acute Rehabilitation Services  Office [phone]

## 2023-09-23 NOTE — TOC Progression Note (Signed)
Transition of Care (TOC) - Progression Note   Patient Details  Name: Raymond Walker. MRN: [id number] Date of Birth: [birth year]  Transition of Care [facility]) CM/SW Contact  Ewing Schlein, LCSW Phone Number: 09/23/2023, 2:27 PM  Clinical Narrative: [facility] refusing to take patient back until he goes to SNF. CSW explained this to patient and patient expressed frustration as he would like to return to [facility]. CSW left VM for niece, Zack Seal, requesting call back. FL2 done. Initial referral faxed out. TOC awaiting bed offers.    Expected Discharge Plan: Skilled Nursing Facility Barriers to Discharge: SNF Pending bed offer, Insurance Authorization  Expected Discharge Plan and Services In-house Referral: Clinical Social Work Post Acute Care Choice: Skilled Nursing Facility Living arrangements for the past 2 months: Assisted Living Facility Expected Discharge Date: 09/22/23               DME Arranged: N/A DME Agency: NA  Social Determinants of Health (SDOH) Interventions SDOH Screenings   Food Insecurity: Food Insecurity Present (09/20/2023)  Housing: Low Risk  (09/20/2023)  Transportation Needs: No Transportation Needs (09/20/2023)  Utilities: Not At Risk (09/20/2023)  Alcohol Screen: Low Risk  (09/19/2021)  Depression (PHQ2-9): Medium Risk (09/11/2023)  Financial Resource Strain: High Risk (03/13/2023)  Physical Activity: Unknown (03/13/2023)  Social Connections: Socially Isolated (03/13/2023)  Stress: No Stress Concern Present (03/13/2023)  Tobacco Use: High Risk (09/20/2023)   Readmission Risk Interventions    12/12/2021    2:38 PM  Readmission Risk Prevention Plan  Transportation Screening Complete  PCP or Specialist Appt within 5-7 Days Complete  Home Care Screening Complete  Medication Review (RN CM) Complete

## 2023-09-23 NOTE — Progress Notes (Signed)
Initial Nutrition Assessment  INTERVENTION:   -Ensure Plus High Protein po BID, each supplement provides 350 kcal and 20 grams of protein.   For wound healing: -500 mg Vitamin C BID -220 mg Zinc sulfate daily x 14 days  NUTRITION DIAGNOSIS:   Increased nutrient needs related to wound healing as evidenced by estimated needs.  GOAL:   Patient will meet greater than or equal to 90% of their needs  MONITOR:   PO intake, Supplement acceptance, Labs, Weight trends, I & O's, Skin  REASON FOR ASSESSMENT:   Consult Wound healing  ASSESSMENT:   72 y.o. male with medical history significant of caregiver fatigue, COPD, equinus and metatarsal deformity, ED, nephrolithiasis, childhood seizures, hypertension, glucose intolerance, recurrent pancreatitis, history of pneumonia, thrombocytopenia, tobacco abuse, peripheral arterial disease in remission according to the patient who was referred by podiatry due to progressive worsening of his left foot wound which will require surgical amputation despite undergoing external iliac artery stenting initially with 90% stenosis and 0% residual with a palpable femoral pulse after procedure with vascular surgery 08/26/23   MRI of left foot: 08/23/23- showed acute osteomyelitis of the first metatarsal head and base of the great toe proximal phalanx.  10/5: s/p Partial first ray amputation left   Patient in room, reports he is eating. A bit irritable. Did not understand why I was at bedside and RD tried to ask if he was eating well PTA. Pt states he did not eat anything for breakfast this morning since he slept in. He was given medication with nothing on his stomach. Pt then began to profusely vomit in bag. Left room and alerted RN.  In order to promote wound healing, will order Ensure supplements and Vitamin C and Zinc.   Last recorded weight is 149 lbs on 9/25. No weight for this admission.  Medications reviewed.  Labs reviewed: CBGs: 99-149   NUTRITION -  FOCUSED PHYSICAL EXAM:  Unable to complete, pt actively vomiting.  Diet Order:   Diet Order             Diet - low sodium heart healthy           Diet - low sodium heart healthy           Diet regular Room service appropriate? Yes; Fluid consistency: Thin  Diet effective now                   EDUCATION NEEDS:   Not appropriate for education at this time  Skin:  Skin Assessment: Skin Integrity Issues: Skin Integrity Issues:: Incisions Incisions: 10/5 left foot  Last BM:  10/7  Height:   Ht Readings from Last 1 Encounters:  09/11/23 5\' 11"  (1.803 m)    Weight:   Wt Readings from Last 1 Encounters:  09/11/23 68.1 kg    BMI:  There is no height or weight on file to calculate BMI.  Estimated Nutritional Needs:   Kcal:  2000-2200  Protein:  85-95g  Fluid:  2L/day   Tilda Franco, MS, RD, LDN Inpatient Clinical Dietitian Contact information available via Amion

## 2023-09-23 NOTE — Progress Notes (Signed)
Ankle-brachial index completed. Please see CV Procedures for preliminary results.  Shona Simpson, RVT 09/23/23 1:40 PM

## 2023-09-23 NOTE — [place] FL2 (Signed)
[place] MEDICAID FL2 LEVEL OF CARE FORM     IDENTIFICATION  Patient Name: Raymond Walker. Birthdate: Dec 11, 1951 Sex: male Admission Date (Current Location): 09/20/2023  [place] and [place] Number:  Haynes Bast [id number] P Facility and Address:  [facility],  [address], [place] [address]      Provider Number: [id number]  Attending Physician Name and Address:  Alwyn Ren, MD  Relative Name and Phone Number:  Zack Seal (niece) Ph: [phone]    Current Level of Care: Hospital Recommended Level of Care: Skilled Nursing Facility Prior Approval Number:    Date Approved/Denied: 09/22/23 PASRR Number: [id number]  Discharge Plan: SNF    Current Diagnoses: Patient Active Problem List   Diagnosis Date Noted   Osteomyelitis of left foot (HCC) 09/20/2023   Osteomyelitis of foot, left, acute (HCC) 08/22/2023   Status post transmetatarsal amputation of foot, 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   Right ureteral stone 01/01/2022   Abdominal pain 01/01/2022   Sepsis secondary to UTI (HCC) 12/06/2021   Leukocytosis 12/06/2021   Hypoalbuminemia due to protein-calorie malnutrition (HCC) 12/06/2021   Lactic acidosis 12/06/2021   AKI (acute kidney injury) (HCC) 12/06/2021   Essential hypertension 12/06/2021   Mixed hyperlipidemia 12/06/2021   GERD (gastroesophageal reflux disease) 12/06/2021   Nephrolithiasis 11/21/2021   Low back pain 09/19/2021   Cognitive dysfunction 06/23/2020   History of seizures 05/31/2020   Erectile dysfunction 05/02/2020   Caregiver with fatigue 05/02/2020   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HCC)    Goals of care, counseling/discussion    Palliative care by specialist    DNR (do not resuscitate)    Vitamin D deficiency 01/24/2020   Sepsis (HCC) 01/23/2020   Altered mental status    Hypokalemia    History of kidney stones    Hematuria    UTI (urinary tract infection)    CHRONIC Bladder outlet obstruction    PAD (peripheral artery  disease) (HCC) 09/15/2019   Protein-calorie malnutrition, severe 08/21/2019   Cellulitis of right lower extremity    Impaired glucose tolerance    COPD (chronic obstructive pulmonary disease) (HCC)    Thrombocytopenia (HCC) 08/10/2018   Aortic atherosclerosis (HCC) 10/14/2016   Coronary artery calcification seen on CAT scan 10/14/2016   Other emphysema (HCC) 10/14/2016   Elevated blood pressure 12/03/2011   Chest pain 12/03/2011   Tobacco abuse 12/03/2011   Right leg pain 12/03/2011    Orientation RESPIRATION BLADDER Height & Weight     Self, Place, Situation, Time  Normal Continent Weight:   Height:     BEHAVIORAL SYMPTOMS/MOOD NEUROLOGICAL BOWEL NUTRITION STATUS      Continent Diet (Regular)  AMBULATORY STATUS COMMUNICATION OF NEEDS Skin   Limited Assist Verbally Surgical wounds                       Personal Care Assistance Level of Assistance  Bathing, Feeding, Dressing Bathing Assistance: Limited assistance Feeding assistance: Independent Dressing Assistance: Limited assistance     Functional Limitations Info  Speech, Hearing, Sight Sight Info: Adequate Hearing Info: Adequate Speech Info: Adequate    SPECIAL CARE FACTORS FREQUENCY  PT (By licensed PT), OT (By licensed OT)     PT Frequency: 5x's/week OT Frequency: 5x's/week            Contractures Contractures Info: Not present    Additional Factors Info  Code Status, Allergies, Insulin Sliding Scale Code Status Info: FULL Allergies Info: NKA   Insulin Sliding Scale Info: See discharge summary  Current Medications (09/23/2023):  This is the current hospital active medication list Current Facility-Administered Medications  Medication Dose Route Frequency Provider Last Rate Last Admin   acetaminophen (TYLENOL) tablet 650 mg  650 mg Oral Q6H PRN Bobette Mo, MD   650 mg at 09/23/23 4098   Or   acetaminophen (TYLENOL) suppository 650 mg  650 mg Rectal Q6H PRN Bobette Mo, MD        ascorbic acid (VITAMIN C) tablet 500 mg  500 mg Oral BID Alwyn Ren, MD       azelastine (ASTELIN) 0.1 % nasal spray 2 spray  2 spray Each Nare BID Bobette Mo, MD       doxycycline (VIBRA-TABS) tablet 100 mg  100 mg Oral Q12H Alwyn Ren, MD   100 mg at 09/23/23 1013   enoxaparin (LOVENOX) injection 40 mg  40 mg Subcutaneous Q24H Bobette Mo, MD   40 mg at 09/20/23 1808   feeding supplement (ENSURE ENLIVE / ENSURE PLUS) liquid 237 mL  237 mL Oral BID BM Alwyn Ren, MD       insulin aspart (novoLOG) injection 0-15 Units  0-15 Units Subcutaneous TID WC Bobette Mo, MD       mometasone-formoterol [facility]) 200-5 MCG/ACT inhaler 2 puff  2 puff Inhalation BID Bobette Mo, MD       ondansetron [facility]) tablet 4 mg  4 mg Oral Q6H PRN Bobette Mo, MD       Or   ondansetron [facility]) injection 4 mg  4 mg Intravenous Q6H PRN Bobette Mo, MD       oxyCODONE (Oxy IR/ROXICODONE) immediate release tablet 5 mg  5 mg Oral Q4H PRN Bobette Mo, MD   5 mg at 09/23/23 0643   pantoprazole (PROTONIX) EC tablet 40 mg  40 mg Oral Daily Bobette Mo, MD   40 mg at 09/23/23 1013   rosuvastatin (CRESTOR) tablet 20 mg  20 mg Oral Daily Bobette Mo, MD   20 mg at 09/23/23 1013   zinc sulfate capsule 220 mg  220 mg Oral Daily Alwyn Ren, MD         Discharge Medications: Please see discharge summary for a list of discharge medications.  Relevant Imaging Results:  Relevant Lab Results:   Additional Information SSN: [id number]  Ewing Schlein, LCSW

## 2023-09-23 NOTE — Progress Notes (Signed)
OT Cancellation Note  Patient Details Name: Raymond Walker. MRN: [id number] DOB: Dec 19, 1950   Cancelled Treatment:    Reason Eval/Treat Not Completed: Other (comment) Patient reported that he did not have any issues and did not need OT at this time. Patient was apologetic at times but firm in statements that he wanted to go home and stop "waisting money".  When attempting to educate patient on WB status "Weightbearing as tolerated mostly for transition purposes only in a surgical shoe" patient reported I have been walking anywhere I want to go I dont need this. OT to sign off at this time.  Rosalio Loud, MS Acute Rehabilitation Department Office# [phone]  09/23/2023, 10:10 AM

## 2023-09-24 DIAGNOSIS — M86172 Other acute osteomyelitis, left ankle and foot: Secondary | ICD-10-CM | POA: Diagnosis not present

## 2023-09-24 LAB — SURGICAL PATHOLOGY

## 2023-09-24 NOTE — Plan of Care (Signed)

## 2023-09-24 NOTE — Progress Notes (Signed)
PROGRESS NOTE    Raymond Walker.  ZOX:[id number] DOB: 1951-01-28 DOA: 09/20/2023 PCP: Babs Sciara, MD   Brief Narrative: 72 y.o. male with medical history significant of caregiver fatigue, COPD, equinus and metatarsal deformity, ED, nephrolithiasis, childhood seizures, hypertension, glucose intolerance, recurrent pancreatitis, history of pneumonia, thrombocytopenia, tobacco abuse, peripheral arterial disease in remission according to the patient who was referred by podiatry due to progressive worsening of his left foot wound which will require surgical amputation despite undergoing external iliac artery stenting initially with 90% stenosis and 0% residual with a palpable femoral pulse after procedure with vascular surgery 08/26/23  MRI of left foot: 08/23/23- showed acute osteomyelitis of the first metatarsal head and base of the great toe proximal phalanx.   Assessment & Plan:   Principal Problem:   Osteomyelitis of left foot (HCC) Active Problems:   Tobacco abuse   Aortic atherosclerosis (HCC)   Coronary artery calcification seen on CAT scan   COPD (chronic obstructive pulmonary disease) (HCC)   PAD (peripheral artery disease) (HCC)   Essential hypertension   GERD (gastroesophageal reflux disease)   osteomyelitis of left foot in the setting of peripheral artery disease and status post recent external iliac artery stenting.   Status post first ray amputation left foot sent for pathology and culture Unfortunately independent living cannot take him back due to recent surgery PT OT consulted Doxycycline 100 twice daily for 7 days total Continue aspirin and Plavix and statin    Tobacco abuse-in remission.  Supportive care     Aortic atherosclerosis continue Crestor      Coronary artery calcification seen on CAT scan.  Continue aspirin Plavix statin     COPD (chronic obstructive pulmonary disease) (HCC) Continue Symbicort or formulary equivalent.     Essential hypertension Not  on antihypertensives.     GERD continue PPI   Estimated body mass index is 20.95 kg/m as calculated from the following:   Height as of 09/11/23: 5\' 11"  (1.803 m).   Weight as of 09/11/23: 68.1 kg.  DVT prophylaxis:lovenox Code Status:full Family Communication:none Disposition Plan:  Status is: Inpatient Remains inpatient appropriate because:medically stable for dc   Consultants:   Dr Logan Bores   Procedures:left foot first ray amputation Antimicrobials: Anti-infectives (From admission, onward)    Start     Dose/Rate Route Frequency Ordered Stop   09/22/23 1530  doxycycline (VIBRA-TABS) tablet 100 mg        100 mg Oral Every 12 hours 09/22/23 1434     09/22/23 0000  doxycycline (ADOXA) 100 MG tablet  Status:  Discontinued        100 mg Oral 2 times daily 09/22/23 1227 09/22/23    09/22/23 0000  doxycycline (ADOXA) 100 MG tablet        100 mg Oral 2 times daily 09/22/23 1312     09/21/23 1515  vancomycin (VANCOREADY) IVPB 1250 mg/250 mL  Status:  Discontinued       Placed in "Followed by" Linked Group   1,250 mg 166.7 mL/hr over 90 Minutes Intravenous Every 24 hours 09/20/23 1440 09/22/23 1945   09/20/23 2200  doxycycline (VIBRA-TABS) tablet 100 mg  Status:  Discontinued        100 mg Oral Every 12 hours 09/20/23 1853 09/20/23 1906   09/20/23 1515  vancomycin (VANCOREADY) IVPB 1500 mg/300 mL       Placed in "Followed by" Linked Group   1,500 mg 150 mL/hr over 120 Minutes Intravenous  Once 09/20/23 1440 09/20/23  PROGRESS NOTE    Raymond Walker.  ZOX:[id number] DOB: 1951-01-28 DOA: 09/20/2023 PCP: Babs Sciara, MD   Brief Narrative: 72 y.o. male with medical history significant of caregiver fatigue, COPD, equinus and metatarsal deformity, ED, nephrolithiasis, childhood seizures, hypertension, glucose intolerance, recurrent pancreatitis, history of pneumonia, thrombocytopenia, tobacco abuse, peripheral arterial disease in remission according to the patient who was referred by podiatry due to progressive worsening of his left foot wound which will require surgical amputation despite undergoing external iliac artery stenting initially with 90% stenosis and 0% residual with a palpable femoral pulse after procedure with vascular surgery 08/26/23  MRI of left foot: 08/23/23- showed acute osteomyelitis of the first metatarsal head and base of the great toe proximal phalanx.   Assessment & Plan:   Principal Problem:   Osteomyelitis of left foot (HCC) Active Problems:   Tobacco abuse   Aortic atherosclerosis (HCC)   Coronary artery calcification seen on CAT scan   COPD (chronic obstructive pulmonary disease) (HCC)   PAD (peripheral artery disease) (HCC)   Essential hypertension   GERD (gastroesophageal reflux disease)   osteomyelitis of left foot in the setting of peripheral artery disease and status post recent external iliac artery stenting.   Status post first ray amputation left foot sent for pathology and culture Unfortunately independent living cannot take him back due to recent surgery PT OT consulted Doxycycline 100 twice daily for 7 days total Continue aspirin and Plavix and statin    Tobacco abuse-in remission.  Supportive care     Aortic atherosclerosis continue Crestor      Coronary artery calcification seen on CAT scan.  Continue aspirin Plavix statin     COPD (chronic obstructive pulmonary disease) (HCC) Continue Symbicort or formulary equivalent.     Essential hypertension Not  on antihypertensives.     GERD continue PPI   Estimated body mass index is 20.95 kg/m as calculated from the following:   Height as of 09/11/23: 5\' 11"  (1.803 m).   Weight as of 09/11/23: 68.1 kg.  DVT prophylaxis:lovenox Code Status:full Family Communication:none Disposition Plan:  Status is: Inpatient Remains inpatient appropriate because:medically stable for dc   Consultants:   Dr Logan Bores   Procedures:left foot first ray amputation Antimicrobials: Anti-infectives (From admission, onward)    Start     Dose/Rate Route Frequency Ordered Stop   09/22/23 1530  doxycycline (VIBRA-TABS) tablet 100 mg        100 mg Oral Every 12 hours 09/22/23 1434     09/22/23 0000  doxycycline (ADOXA) 100 MG tablet  Status:  Discontinued        100 mg Oral 2 times daily 09/22/23 1227 09/22/23    09/22/23 0000  doxycycline (ADOXA) 100 MG tablet        100 mg Oral 2 times daily 09/22/23 1312     09/21/23 1515  vancomycin (VANCOREADY) IVPB 1250 mg/250 mL  Status:  Discontinued       Placed in "Followed by" Linked Group   1,250 mg 166.7 mL/hr over 90 Minutes Intravenous Every 24 hours 09/20/23 1440 09/22/23 1945   09/20/23 2200  doxycycline (VIBRA-TABS) tablet 100 mg  Status:  Discontinued        100 mg Oral Every 12 hours 09/20/23 1853 09/20/23 1906   09/20/23 1515  vancomycin (VANCOREADY) IVPB 1500 mg/300 mL       Placed in "Followed by" Linked Group   1,500 mg 150 mL/hr over 120 Minutes Intravenous  Once 09/20/23 1440 09/20/23  PROGRESS NOTE    Raymond Walker.  ZOX:[id number] DOB: 1951-01-28 DOA: 09/20/2023 PCP: Babs Sciara, MD   Brief Narrative: 72 y.o. male with medical history significant of caregiver fatigue, COPD, equinus and metatarsal deformity, ED, nephrolithiasis, childhood seizures, hypertension, glucose intolerance, recurrent pancreatitis, history of pneumonia, thrombocytopenia, tobacco abuse, peripheral arterial disease in remission according to the patient who was referred by podiatry due to progressive worsening of his left foot wound which will require surgical amputation despite undergoing external iliac artery stenting initially with 90% stenosis and 0% residual with a palpable femoral pulse after procedure with vascular surgery 08/26/23  MRI of left foot: 08/23/23- showed acute osteomyelitis of the first metatarsal head and base of the great toe proximal phalanx.   Assessment & Plan:   Principal Problem:   Osteomyelitis of left foot (HCC) Active Problems:   Tobacco abuse   Aortic atherosclerosis (HCC)   Coronary artery calcification seen on CAT scan   COPD (chronic obstructive pulmonary disease) (HCC)   PAD (peripheral artery disease) (HCC)   Essential hypertension   GERD (gastroesophageal reflux disease)   osteomyelitis of left foot in the setting of peripheral artery disease and status post recent external iliac artery stenting.   Status post first ray amputation left foot sent for pathology and culture Unfortunately independent living cannot take him back due to recent surgery PT OT consulted Doxycycline 100 twice daily for 7 days total Continue aspirin and Plavix and statin    Tobacco abuse-in remission.  Supportive care     Aortic atherosclerosis continue Crestor      Coronary artery calcification seen on CAT scan.  Continue aspirin Plavix statin     COPD (chronic obstructive pulmonary disease) (HCC) Continue Symbicort or formulary equivalent.     Essential hypertension Not  on antihypertensives.     GERD continue PPI   Estimated body mass index is 20.95 kg/m as calculated from the following:   Height as of 09/11/23: 5\' 11"  (1.803 m).   Weight as of 09/11/23: 68.1 kg.  DVT prophylaxis:lovenox Code Status:full Family Communication:none Disposition Plan:  Status is: Inpatient Remains inpatient appropriate because:medically stable for dc   Consultants:   Dr Logan Bores   Procedures:left foot first ray amputation Antimicrobials: Anti-infectives (From admission, onward)    Start     Dose/Rate Route Frequency Ordered Stop   09/22/23 1530  doxycycline (VIBRA-TABS) tablet 100 mg        100 mg Oral Every 12 hours 09/22/23 1434     09/22/23 0000  doxycycline (ADOXA) 100 MG tablet  Status:  Discontinued        100 mg Oral 2 times daily 09/22/23 1227 09/22/23    09/22/23 0000  doxycycline (ADOXA) 100 MG tablet        100 mg Oral 2 times daily 09/22/23 1312     09/21/23 1515  vancomycin (VANCOREADY) IVPB 1250 mg/250 mL  Status:  Discontinued       Placed in "Followed by" Linked Group   1,250 mg 166.7 mL/hr over 90 Minutes Intravenous Every 24 hours 09/20/23 1440 09/22/23 1945   09/20/23 2200  doxycycline (VIBRA-TABS) tablet 100 mg  Status:  Discontinued        100 mg Oral Every 12 hours 09/20/23 1853 09/20/23 1906   09/20/23 1515  vancomycin (VANCOREADY) IVPB 1500 mg/300 mL       Placed in "Followed by" Linked Group   1,500 mg 150 mL/hr over 120 Minutes Intravenous  Once 09/20/23 1440 09/20/23  in the last 168 hours.  Recent Results (from the past 240 hour(s))  Surgical pcr screen     Status: None   Collection Time: 09/21/23  6:23 AM   Specimen: Nasal Mucosa; Nasal Swab  Result Value Ref Range Status   MRSA, PCR NEGATIVE NEGATIVE Final   Staphylococcus aureus NEGATIVE NEGATIVE Final    Comment: (NOTE) The Xpert SA Assay (FDA approved for NASAL specimens in patients 75 years of age and older), is one component of a comprehensive surveillance program. It is not intended to diagnose infection nor to guide or monitor treatment. Performed at [facility], [address] [place], [place] [address]   Aerobic/Anaerobic Culture w Gram Stain (surgical/deep wound)     Status: None (Preliminary result)   Collection Time: 09/21/23 12:47 PM   Specimen: PATH Digit amputation; Tissue  Result Value Ref Range Status   Specimen Description   Final    TOE LEFT FOOT FIRST METATARSAL Performed at [facility], [address] [place], [place] [address]    Special Requests   Final    NONE Performed at [facility], [address], [place], [place] [address]    Gram Stain NO WBC SEEN NO ORGANISMS SEEN   Final   Culture   Final    NO GROWTH 3 DAYS NO ANAEROBES ISOLATED; CULTURE IN PROGRESS FOR 5 DAYS Performed at [facility], [address] [place], [place] [address]    Report Status PENDING  Incomplete     Radiology Studies: VAS [place] ABI WITH/WO TBI  Result Date: 09/23/2023  LOWER EXTREMITY DOPPLER STUDY Patient Name:  KERRON SEDANO.  Date of Exam:   09/23/2023 Medical Rec #: [id number]           Accession #:    [id number] Date of Birth: 01-11-1951            Patient Gender: M Patient Age:   34 years Exam Location:  [facility] Procedure:      VAS [place] ABI WITH/WO TBI Referring Phys: DAVID ORTIZ --------------------------------------------------------------------------------  Indications: Peripheral artery disease. High Risk Factors: Hypertension.  Vascular Interventions: 08/27/23 LT CFA-POP BPG. 09/21/23 LT Digit Amputation. Limitations: Today's exam was limited due to involuntary patient movement. Comparison Study: Significant changes seen since prior exam 08/25/23 Performing Technologist: Shona Simpson  Examination Guidelines: A complete evaluation includes at minimum, Doppler waveform signals and systolic blood pressure reading at the level of bilateral brachial, anterior tibial, and posterior tibial arteries, when vessel segments are accessible. Bilateral testing is considered an integral part of a complete examination. Photoelectric Plethysmograph (PPG) waveforms and toe systolic pressure readings are included as required and additional duplex testing as needed. Limited examinations for reoccurring indications may be performed as noted.  ABI Findings: +---------+------------------+-----+-------------------+----------+ Right    Rt Pressure (mmHg)IndexWaveform           Comment    +---------+------------------+-----+-------------------+----------+ Brachial 93                      dampened monophasic           +---------+------------------+-----+-------------------+----------+ PTA      49                0.33 dampened monophasic           +---------+------------------+-----+-------------------+----------+ DP

## 2023-09-24 NOTE — TOC Progression Note (Addendum)
Transition of Care (TOC) - Progression Note   Patient Details  Name: Raymond Walker. MRN: [id number] Date of Birth: [birth year]  Transition of Care [facility]) CM/SW Contact  Ewing Schlein, LCSW Phone Number: 09/24/2023, 1:14 PM  Clinical Narrative: CSW provided niece with bed offers and [facility] was chosen. CSW updated patient, who expressed frustration with the ALF.   CSW notified Revonda Standard with admissions of family's bed choice. CSW started insurance authorization on [employer] portal, which is pending. Reference ID # is: [id number]. TOC awaiting insurance approval.   Expected Discharge Plan: Skilled Nursing Facility Barriers to Discharge: SNF Pending bed offer, Insurance Authorization  Expected Discharge Plan and Services In-house Referral: Clinical Social Work Post Acute Care Choice: Skilled Nursing Facility Living arrangements for the past 2 months: Assisted Living Facility Expected Discharge Date: 09/22/23               DME Arranged: N/A DME Agency: NA  Social Determinants of Health (SDOH) Interventions SDOH Screenings   Food Insecurity: Food Insecurity Present (09/20/2023)  Housing: Low Risk  (09/20/2023)  Transportation Needs: No Transportation Needs (09/20/2023)  Utilities: Not At Risk (09/20/2023)  Alcohol Screen: Low Risk  (09/19/2021)  Depression (PHQ2-9): Medium Risk (09/11/2023)  Financial Resource Strain: High Risk (03/13/2023)  Physical Activity: Unknown (03/13/2023)  Social Connections: Socially Isolated (03/13/2023)  Stress: No Stress Concern Present (03/13/2023)  Tobacco Use: High Risk (09/20/2023)   Readmission Risk Interventions    12/12/2021    2:38 PM  Readmission Risk Prevention Plan  Transportation Screening Complete  PCP or Specialist Appt within 5-7 Days Complete  Home Care Screening Complete  Medication Review (RN CM) Complete

## 2023-09-25 DIAGNOSIS — M869 Osteomyelitis, unspecified: Secondary | ICD-10-CM | POA: Diagnosis not present

## 2023-09-25 MED ORDER — ASPIRIN 81 MG PO TBEC
81.0000 mg | DELAYED_RELEASE_TABLET | Freq: Every day | ORAL | Status: DC
  Administered 2023-09-25 – 2023-09-26 (×2): 81 mg via ORAL
  Filled 2023-09-25 (×2): qty 1

## 2023-09-25 MED ORDER — CLOPIDOGREL BISULFATE 75 MG PO TABS
75.0000 mg | ORAL_TABLET | Freq: Every day | ORAL | Status: DC
  Administered 2023-09-25: 75 mg via ORAL
  Filled 2023-09-25 (×2): qty 1

## 2023-09-25 NOTE — Assessment & Plan Note (Signed)
Continue Crestor 

## 2023-09-25 NOTE — Progress Notes (Signed)
Patient refused CBG check for tonight.

## 2023-09-25 NOTE — Assessment & Plan Note (Signed)
-   On Symbicort outpatient

## 2023-09-25 NOTE — Plan of Care (Signed)

## 2023-09-25 NOTE — Progress Notes (Addendum)
Physical Therapy Treatment Patient Details Name: Raymond Walker. MRN: [id number] DOB: March 18, 1951 Today's Date: 09/25/2023   History of Present Illness 72 y.o. male with medical history significant of caregiver fatigue, COPD, equinus and metatarsal deformity, ED, nephrolithiasis, childhood seizures, hypertension, glucose intolerance, recurrent pancreatitis, history of pneumonia, thrombocytopenia, tobacco abuse, peripheral arterial disease in remission according to the patient who was referred by podiatry due to progressive worsening of his left foot wound which will require surgical amputation despite undergoing external iliac artery stenting initially with 90% stenosis and 0% residual with a palpable femoral pulse after procedure with vascular surgery 08/26/23   MRI of left foot: 08/23/23- showed acute osteomyelitis of the first metatarsal head and base of the great toe proximal phalanx.  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    PT Comments  Pt is mobilizing well at a modified independent level. He ambulated 49' with a RW, no loss of balance, 2/4 dyspnea, SpO2 98% on room air walking, HR 105 walking. Instructed pt in seated LLE strengthening exercises, he demonstrates good understanding. He is safe to ambulate independently in the halls with a RW.  PT goals have been met, PT signing off.    If plan is discharge home, recommend the following: Assist for transportation;Help with stairs or ramp for entrance   Can travel by private vehicle        Equipment Recommendations  None recommended by PT    Recommendations for Other Services       Precautions / Restrictions Precautions Precaution Comments: 50% PWB with CAM boot; pt denies h/o falls in past 6 months Required Braces or Orthoses: Other Brace Other Brace: CAM boot Restrictions Weight Bearing Restrictions: Yes LLE Weight Bearing: Partial weight bearing LLE Partial Weight Bearing Percentage or Pounds: 50% with CAM boot      Mobility  Bed Mobility Overal bed mobility: Modified Independent Bed Mobility: Supine to Sit     Supine to sit: Used rails, HOB elevated, Modified independent (Device/Increase time)          Transfers Overall transfer level: Modified independent Equipment used: Rolling walker (2 wheels) Transfers: Sit to/from Stand Sit to Stand: Modified independent (Device/Increase time)                Ambulation/Gait Ambulation/Gait assistance: Modified independent (Device/Increase time) Gait Distance (Feet): 130 Feet Assistive device: Rolling walker (2 wheels) Gait Pattern/deviations: Step-to pattern, Decreased step length - right, Decreased step length - left, Trunk flexed Gait velocity: decreased     General Gait Details: steady, no loss of balance, good adherence to PWB LLE, pt reports he's WBing through his heel, denies pain with walking, 2/4 dyspnea with walking, SpO2 98% on room air walking, HR 105 with walking   Stairs             Wheelchair Mobility     Tilt Bed    Modified Rankin (Stroke Patients Only)       Balance Overall balance assessment: Modified Independent Sitting-balance support: Feet supported, No upper extremity supported Sitting balance-Leahy Scale: Good     Standing balance support: Bilateral upper extremity supported, During functional activity, Reliant on assistive device for balance Standing balance-Leahy Scale: Fair                              Cognition Arousal: Alert Behavior During Therapy: WFL for tasks assessed/performed Overall Cognitive Status: Within Functional Limits for tasks assessed  Exercises General Exercises - Lower Extremity Ankle Circles/Pumps: AROM, Left, 5 reps, Seated Long Arc Quad: AROM, Left, 5 reps, Seated Hip Flexion/Marching: AROM, Left, 5 reps, Seated    General Comments        Pertinent Vitals/Pain Pain Assessment Pain  Assessment: No/denies pain Pain Score: 0-No pain Faces Pain Scale: No hurt Pain Location: L LE Pain Descriptors / Indicators: Discomfort, Grimacing, Guarding    Home Living                          Prior Function            PT Goals (current goals can now be found in the care plan section) Acute Rehab PT Goals Patient Stated Goal: to return home (to ALF) PT Goal Formulation: With patient Time For Goal Achievement: 09/11/23 Potential to Achieve Goals: Good Progress towards PT goals: Goals met/education completed, patient discharged from PT    Frequency    Min 1X/week      PT Plan      Co-evaluation              AM-PAC PT "6 Clicks" Mobility   Outcome Measure  Help needed turning from your back to your side while in a flat bed without using bedrails?: None Help needed moving from lying on your back to sitting on the side of a flat bed without using bedrails?: None Help needed moving to and from a bed to a chair (including a wheelchair)?: None Help needed standing up from a chair using your arms (e.g., wheelchair or bedside chair)?: None Help needed to walk in hospital room?: None Help needed climbing 3-5 steps with a railing? : None 6 Click Score: 24    End of Session Equipment Utilized During Treatment: Gait belt Activity Tolerance: Patient tolerated treatment well Patient left: in bed;with call bell/phone within reach Nurse Communication: Mobility status PT Visit Diagnosis: Difficulty in walking, not elsewhere classified (R26.2)     Time: 9147-8295 PT Time Calculation (min) (ACUTE ONLY): 12 min  Charges:    $Gait Training: 8-22 mins PT General Charges $$ ACUTE PT VISIT: 1 Visit                    Tamala Ser PT 09/25/2023  Acute Rehabilitation Services  Office [phone]

## 2023-09-25 NOTE — Assessment & Plan Note (Addendum)
-   persistent left foot acute OM on recent MRI - s/p partial first ray amputation left with podiatry on 09/21/2023 -Patient has been ambulating well per PT and considered stable for returning to prior living facility and level of functioning; he is safe to ambulate independently in the halls with a rolling walker -Finish course of doxycycline; plan for 7 days postop - outpatient followup with podiatry

## 2023-09-25 NOTE — Assessment & Plan Note (Signed)
Continue PPI ?

## 2023-09-25 NOTE — Assessment & Plan Note (Signed)
-   Continue aspirin, Plavix, Crestor 

## 2023-09-25 NOTE — Hospital Course (Signed)
Raymond Walker is a 72 yo male with PMH HTN, history of seizures, COPD, PAD who was referred to the hospital due to progressive worsening of his left foot.  He had recently undergone left common femoral to popliteal artery bypass with vascular surgery on 08/27/2023. Prior MRI left foot on 08/23/2023 showed osteomyelitis involving left first MT head and base of great toe proximal phalanx. Due to persistent wound and nonhealing, he was recommended for undergoing surgery with podiatry.  He underwent partial first ray amputation of the left foot on 09/21/2023. He tolerated surgery well and was able to ambulate with PT after surgery in his surgical shoe.

## 2023-09-25 NOTE — Progress Notes (Signed)
Progress Note    Raymond Walker.   XBJ:[id number]  DOB: Feb 07, 1951  DOA: 09/20/2023     5 PCP: Babs Sciara, MD  Initial CC: left foot wound  Hospital Course: Mr. Skemp is a 72 yo male with PMH HTN, history of seizures, COPD, PAD who was referred to the hospital due to progressive worsening of his left foot.  He had recently undergone left common femoral to popliteal artery bypass with vascular surgery on 08/27/2023. Prior MRI left foot on 08/23/2023 showed osteomyelitis involving left first MT head and base of great toe proximal phalanx. Due to persistent wound and nonhealing, he was recommended for undergoing surgery with podiatry.  He underwent partial first ray amputation of the left foot on 09/21/2023. He tolerated surgery well and was able to ambulate with PT after surgery in his surgical shoe.  Interval History:  No events overnight.  He is anxious for discharging back to his prior facility. Doing well with PT.   Assessment and Plan: * Osteomyelitis of left foot (HCC) - persistent left foot acute OM on recent MRI - s/p partial first ray amputation left with podiatry on 09/21/2023 -Patient has been ambulating well per PT and considered stable for returning to prior living facility and level of functioning; he is safe to ambulate independently in the halls with a rolling walker -Finish course of doxycycline; plan for 7 days postop  GERD (gastroesophageal reflux disease) - Continue PPI  Essential hypertension - Blood pressure controlled without antihypertensives  PAD (peripheral artery disease) (HCC) - Continue aspirin, Plavix, Crestor  COPD (chronic obstructive pulmonary disease) (HCC) - On Symbicort outpatient  Aortic atherosclerosis (HCC) - Continue Crestor   Old records reviewed in assessment of this patient  Antimicrobials: Rocephin 09/20/2023 >> 09/21/2023 Flagyl 09/20/2023 >> 09/22/2023 Vancomycin 09/20/2023 >> 09/21/2023 Doxycycline 09/22/2023 >> current  DVT  prophylaxis:  enoxaparin (LOVENOX) injection 40 mg Start: 09/20/23 1800   Code Status:   Code Status: Limited: Do not attempt resuscitation (DNR) -DNR-LIMITED -Do Not Intubate/DNI   Mobility Assessment (Last 72 Hours)     Mobility Assessment     Row Name 09/25/23 1143 09/25/23 0737 09/24/23 1941 09/24/23 0928 09/23/23 2105   Does patient have an order for bedrest or is patient medically unstable -- No - Continue assessment No - Continue assessment No - Continue assessment No - Continue assessment   What is the highest level of mobility based on the progressive mobility assessment? Level 6 (Walks independently in room and hall) - Balance while walking in room without assist - Complete Level 5 (Walks with assist in room/hall) - Balance while stepping forward/back and can walk in room with assist - Complete Level 5 (Walks with assist in room/hall) - Balance while stepping forward/back and can walk in room with assist - Complete Level 5 (Walks with assist in room/hall) - Balance while stepping forward/back and can walk in room with assist - Complete Level 5 (Walks with assist in room/hall) - Balance while stepping forward/back and can walk in room with assist - Complete   Is the above level different from baseline mobility prior to current illness? -- Yes - Recommend PT order -- Yes - Recommend PT order --    Row Name 09/23/23 1010 09/23/23 1009 09/22/23 1945       Does patient have an order for bedrest or is patient medically unstable No - Continue assessment -- No - Continue assessment     What is the highest level of mobility  based on the progressive mobility assessment? Level 5 (Walks with assist in room/hall) - Balance while stepping forward/back and can walk in room with assist - Complete Level 5 (Walks with assist in room/hall) - Balance while stepping forward/back and can walk in room with assist - Complete Level 5 (Walks with assist in room/hall) - Balance while stepping forward/back and can  walk in room with assist - Complete     Is the above level different from baseline mobility prior to current illness? Yes - Recommend PT order -- Yes - Recommend PT order              Barriers to discharge: Awaiting approval to return to prior facility; denied from SNF s/p P2P also Disposition Plan:  TBD Status is: Inpt  Objective: Blood pressure 121/83, pulse 80, temperature 98.2 F (36.8 C), temperature source Oral, resp. rate 18, SpO2 99%.  Examination:  Physical Exam Constitutional:      General: He is not in acute distress.    Appearance: Normal appearance.  HENT:     Head: Normocephalic and atraumatic.     Mouth/Throat:     Mouth: Mucous membranes are moist.  Eyes:     Extraocular Movements: Extraocular movements intact.  Cardiovascular:     Rate and Rhythm: Normal rate and regular rhythm.  Pulmonary:     Effort: Pulmonary effort is normal. No respiratory distress.     Breath sounds: Normal breath sounds. No wheezing.  Abdominal:     General: Bowel sounds are normal. There is no distension.     Palpations: Abdomen is soft.     Tenderness: There is no abdominal tenderness.  Musculoskeletal:     Cervical back: Normal range of motion and neck supple.     Comments: Left foot in surgical dressing; no edema noted in extremities  Skin:    General: Skin is warm and dry.  Neurological:     General: No focal deficit present.     Mental Status: He is alert.  Psychiatric:        Mood and Affect: Mood normal.        Behavior: Behavior normal.      Consultants:  Podiatry   Procedures:  09/21/2023: Partial first ray amputation left  Data Reviewed: No results found for this or any previous visit (from the past 24 hour(s)).  I have reviewed pertinent nursing notes, vitals, labs, and images as necessary. I have ordered labwork to follow up on as indicated.  I have reviewed the last notes from staff over past 24 hours. I have discussed patient's care plan and test  results with nursing staff, CM/SW, and other staff as appropriate.  Time spent: Greater than 50% of the 55 minute visit was spent in counseling/coordination of care for the patient as laid out in the A&P.   LOS: 5 days   Lewie Chamber, MD [facility] 09/25/2023, 4:47 PM

## 2023-09-25 NOTE — Assessment & Plan Note (Signed)
-   Blood pressure controlled without antihypertensives

## 2023-09-25 NOTE — TOC Progression Note (Signed)
Transition of Care (TOC) - Progression Note   Patient Details  Name: Raymond Walker. MRN: [id number] Date of Birth: 05/07/1951  Transition of Care [facility]) CM/SW Contact  Ewing Schlein, LCSW Phone Number: 09/25/2023, 1:58 PM  Clinical Narrative: Patient was denied by insurance for SNF, so a peer to peer was offered. Peer to peer completed by hospitalist, which was also denied. CSW updated Tammy at [facility]. Tammy stated the patient cannot return until he is assessed in-person by her and she will be here tomorrow to complete her assessment. CSW updated patient and left VM for patient's niece.  Expected Discharge Plan: Assisted Living Barriers to Discharge: Other (must enter comment) ([facility] is refusing to accept the patient back until they complete an in-person assessment since insurance denied SNF.)  Expected Discharge Plan and Services In-house Referral: Clinical Social Work Post Acute Care Choice: NA Living arrangements for the past 2 months: Assisted Living Facility Expected Discharge Date: 09/22/23               DME Arranged: N/A DME Agency: NA  Social Determinants of Health (SDOH) Interventions SDOH Screenings   Food Insecurity: Food Insecurity Present (09/20/2023)  Housing: Low Risk  (09/20/2023)  Transportation Needs: No Transportation Needs (09/20/2023)  Utilities: Not At Risk (09/20/2023)  Alcohol Screen: Low Risk  (09/19/2021)  Depression (PHQ2-9): Medium Risk (09/11/2023)  Financial Resource Strain: High Risk (03/13/2023)  Physical Activity: Unknown (03/13/2023)  Social Connections: Socially Isolated (03/13/2023)  Stress: No Stress Concern Present (03/13/2023)  Tobacco Use: High Risk (09/20/2023)   Readmission Risk Interventions    12/12/2021    2:38 PM  Readmission Risk Prevention Plan  Transportation Screening Complete  PCP or Specialist Appt within 5-7 Days Complete  Home Care Screening Complete  Medication Review (RN CM) Complete

## 2023-09-26 DIAGNOSIS — M869 Osteomyelitis, unspecified: Secondary | ICD-10-CM | POA: Diagnosis not present

## 2023-09-26 MED ORDER — DOXYCYCLINE HYCLATE 100 MG PO TABS: 100.0000 mg | ORAL_TABLET | Freq: Two times a day (BID) | ORAL | Status: AC

## 2023-09-26 MED ORDER — ACETAMINOPHEN 325 MG PO TABS: 650.0000 mg | ORAL_TABLET | ORAL | Status: DC | PRN

## 2023-09-26 NOTE — Discharge Summary (Signed)
Physician Discharge Summary   Raymond Walker. ZOX:[id number] DOB: [birth year] DOA: 09/20/2023  PCP: Babs Sciara, MD  Admit date: 09/20/2023 Discharge date: 09/26/2023  Admitted From: [facility] Disposition:  same Discharging physician: Lewie Chamber, MD Barriers to discharge: none  Recommendations at discharge: Follow up with podiatry this coming week   Discharge Condition: stable CODE STATUS: DNR Diet recommendation:  Diet Orders (From admission, onward)     Start     Ordered   09/26/23 0000  Diet - low sodium heart healthy        09/26/23 1210   09/21/23 1307  Diet regular Room service appropriate? Yes; Fluid consistency: Thin  Diet effective now       Question Answer Comment  Room service appropriate? Yes   Fluid consistency: Thin      09/21/23 1306            Hospital Course: [name] is a 72 yo male with PMH HTN, history of seizures, COPD, PAD who was referred to the hospital due to progressive worsening of his left foot.  He had recently undergone left common femoral to popliteal artery bypass with vascular surgery on 08/27/2023. Prior MRI left foot on 08/23/2023 showed osteomyelitis involving left first MT head and base of great toe proximal phalanx. Due to persistent wound and nonhealing, he was recommended for undergoing surgery with podiatry.  He underwent partial first ray amputation of the left foot on 09/21/2023. He tolerated surgery well and was able to ambulate with PT after surgery in his surgical shoe.  Assessment and Plan: * Osteomyelitis of left foot (HCC) - persistent left foot acute OM on recent MRI - s/p partial first ray amputation left with podiatry on 09/21/2023 -Patient has been ambulating well per PT and considered stable for returning to prior living facility and level of functioning; he is safe to ambulate independently in the halls with a rolling walker -Finish course of doxycycline; plan for 7 days postop - outpatient followup with  podiatry  GERD (gastroesophageal reflux disease) - Continue PPI  Essential hypertension - Blood pressure controlled without antihypertensives  PAD (peripheral artery disease) (HCC) - Continue aspirin, Plavix, Crestor  COPD (chronic obstructive pulmonary disease) (HCC) - On Symbicort outpatient  Aortic atherosclerosis (HCC) - Continue Crestor    Principal Diagnosis: Osteomyelitis of left foot (HCC)  Discharge Diagnoses: Active Hospital Problems   Diagnosis Date Noted   Osteomyelitis of left foot (HCC) 09/20/2023    Priority: 1.   GERD (gastroesophageal reflux disease) 12/06/2021   Essential hypertension 12/06/2021   PAD (peripheral artery disease) (HCC) 09/15/2019   COPD (chronic obstructive pulmonary disease) (HCC)    Aortic atherosclerosis (HCC) 10/14/2016   Tobacco abuse 12/03/2011    Resolved Hospital Problems  No resolved problems to display.     Discharge Instructions     Diet - low sodium heart healthy   Complete by: As directed    Discharge wound care:   Complete by: As directed    Keep dressing clean dry and intact until seen by Dr. Logan Bores in his office.  Weight-bear as tolerated in surgical shoes only for transitioning purposes.   Increase activity slowly   Complete by: As directed    Increase activity slowly   Complete by: As directed    Increase activity slowly   Complete by: As directed    Leave dressing on - Keep it clean, dry, and intact until clinic visit   Complete by: As directed  09/23/2023 Medical Rec #: [id number]           Accession #:    [id number] Date of Birth: April 05, 1951            Patient Gender: M Patient Age:   72 years Exam Location:  [facility] Procedure:      VAS [place] ABI WITH/WO TBI Referring Phys: Raymond Walker --------------------------------------------------------------------------------  Indications: Peripheral artery disease. High Risk Factors: Hypertension.  Vascular Interventions: 08/27/23 LT CFA-POP BPG. 09/21/23 LT Digit Amputation. Limitations: Today's exam was limited due to involuntary patient movement. Comparison Study: Significant changes seen since prior exam 08/25/23 Performing Technologist: Shona Simpson  Examination Guidelines: A complete evaluation includes at minimum, Doppler waveform  signals and systolic blood pressure reading at the level of bilateral brachial, anterior tibial, and posterior tibial arteries, when vessel segments are accessible. Bilateral testing is considered an integral part of a complete examination. Photoelectric Plethysmograph (PPG) waveforms and toe systolic pressure readings are included as required and additional duplex testing as needed. Limited examinations for reoccurring indications may be performed as noted.  ABI Findings: +---------+------------------+-----+-------------------+----------+ Right    Rt Pressure (mmHg)IndexWaveform           Comment    +---------+------------------+-----+-------------------+----------+ Brachial 93                     dampened monophasic           +---------+------------------+-----+-------------------+----------+ PTA      49                0.33 dampened monophasic           +---------+------------------+-----+-------------------+----------+ DP                                                 Inaudible  +---------+------------------+-----+-------------------+----------+ Great Toe                                          Amputation +---------+------------------+-----+-------------------+----------+ +---------+------------------+-----+---------+-------+ Left     Lt Pressure (mmHg)IndexWaveform Comment +---------+------------------+-----+---------+-------+ Brachial 148                    triphasic        +---------+------------------+-----+---------+-------+ PTA      144               0.97 biphasic         +---------+------------------+-----+---------+-------+ DP       167               1.13 biphasic         +---------+------------------+-----+---------+-------+ Great Toe59                0.40 Abnormal         +---------+------------------+-----+---------+-------+ +-------+-----------+-----------+------------+------------+ ABI/TBIToday's ABIToday's TBIPrevious ABIPrevious  TBI +-------+-----------+-----------+------------+------------+ Right  0.33       amputation 0.43        amputation   +-------+-----------+-----------+------------+------------+ Left   1.13       0.4        0.57        0.19         +-------+-----------+-----------+------------+------------+ LT TBI taken on second toe, Great Toe previously amutated. Right ABIs appear decreased compared to prior study on 08/25/23. Left  Physician Discharge Summary   Raymond Walker. ZOX:[id number] DOB: [birth year] DOA: 09/20/2023  PCP: Babs Sciara, MD  Admit date: 09/20/2023 Discharge date: 09/26/2023  Admitted From: [facility] Disposition:  same Discharging physician: Lewie Chamber, MD Barriers to discharge: none  Recommendations at discharge: Follow up with podiatry this coming week   Discharge Condition: stable CODE STATUS: DNR Diet recommendation:  Diet Orders (From admission, onward)     Start     Ordered   09/26/23 0000  Diet - low sodium heart healthy        09/26/23 1210   09/21/23 1307  Diet regular Room service appropriate? Yes; Fluid consistency: Thin  Diet effective now       Question Answer Comment  Room service appropriate? Yes   Fluid consistency: Thin      09/21/23 1306            Hospital Course: [name] is a 72 yo male with PMH HTN, history of seizures, COPD, PAD who was referred to the hospital due to progressive worsening of his left foot.  He had recently undergone left common femoral to popliteal artery bypass with vascular surgery on 08/27/2023. Prior MRI left foot on 08/23/2023 showed osteomyelitis involving left first MT head and base of great toe proximal phalanx. Due to persistent wound and nonhealing, he was recommended for undergoing surgery with podiatry.  He underwent partial first ray amputation of the left foot on 09/21/2023. He tolerated surgery well and was able to ambulate with PT after surgery in his surgical shoe.  Assessment and Plan: * Osteomyelitis of left foot (HCC) - persistent left foot acute OM on recent MRI - s/p partial first ray amputation left with podiatry on 09/21/2023 -Patient has been ambulating well per PT and considered stable for returning to prior living facility and level of functioning; he is safe to ambulate independently in the halls with a rolling walker -Finish course of doxycycline; plan for 7 days postop - outpatient followup with  podiatry  GERD (gastroesophageal reflux disease) - Continue PPI  Essential hypertension - Blood pressure controlled without antihypertensives  PAD (peripheral artery disease) (HCC) - Continue aspirin, Plavix, Crestor  COPD (chronic obstructive pulmonary disease) (HCC) - On Symbicort outpatient  Aortic atherosclerosis (HCC) - Continue Crestor    Principal Diagnosis: Osteomyelitis of left foot (HCC)  Discharge Diagnoses: Active Hospital Problems   Diagnosis Date Noted   Osteomyelitis of left foot (HCC) 09/20/2023    Priority: 1.   GERD (gastroesophageal reflux disease) 12/06/2021   Essential hypertension 12/06/2021   PAD (peripheral artery disease) (HCC) 09/15/2019   COPD (chronic obstructive pulmonary disease) (HCC)    Aortic atherosclerosis (HCC) 10/14/2016   Tobacco abuse 12/03/2011    Resolved Hospital Problems  No resolved problems to display.     Discharge Instructions     Diet - low sodium heart healthy   Complete by: As directed    Discharge wound care:   Complete by: As directed    Keep dressing clean dry and intact until seen by Dr. Logan Bores in his office.  Weight-bear as tolerated in surgical shoes only for transitioning purposes.   Increase activity slowly   Complete by: As directed    Increase activity slowly   Complete by: As directed    Increase activity slowly   Complete by: As directed    Leave dressing on - Keep it clean, dry, and intact until clinic visit   Complete by: As directed  09/23/2023 Medical Rec #: [id number]           Accession #:    [id number] Date of Birth: April 05, 1951            Patient Gender: M Patient Age:   72 years Exam Location:  [facility] Procedure:      VAS [place] ABI WITH/WO TBI Referring Phys: Raymond Walker --------------------------------------------------------------------------------  Indications: Peripheral artery disease. High Risk Factors: Hypertension.  Vascular Interventions: 08/27/23 LT CFA-POP BPG. 09/21/23 LT Digit Amputation. Limitations: Today's exam was limited due to involuntary patient movement. Comparison Study: Significant changes seen since prior exam 08/25/23 Performing Technologist: Shona Simpson  Examination Guidelines: A complete evaluation includes at minimum, Doppler waveform  signals and systolic blood pressure reading at the level of bilateral brachial, anterior tibial, and posterior tibial arteries, when vessel segments are accessible. Bilateral testing is considered an integral part of a complete examination. Photoelectric Plethysmograph (PPG) waveforms and toe systolic pressure readings are included as required and additional duplex testing as needed. Limited examinations for reoccurring indications may be performed as noted.  ABI Findings: +---------+------------------+-----+-------------------+----------+ Right    Rt Pressure (mmHg)IndexWaveform           Comment    +---------+------------------+-----+-------------------+----------+ Brachial 93                     dampened monophasic           +---------+------------------+-----+-------------------+----------+ PTA      49                0.33 dampened monophasic           +---------+------------------+-----+-------------------+----------+ DP                                                 Inaudible  +---------+------------------+-----+-------------------+----------+ Great Toe                                          Amputation +---------+------------------+-----+-------------------+----------+ +---------+------------------+-----+---------+-------+ Left     Lt Pressure (mmHg)IndexWaveform Comment +---------+------------------+-----+---------+-------+ Brachial 148                    triphasic        +---------+------------------+-----+---------+-------+ PTA      144               0.97 biphasic         +---------+------------------+-----+---------+-------+ DP       167               1.13 biphasic         +---------+------------------+-----+---------+-------+ Great Toe59                0.40 Abnormal         +---------+------------------+-----+---------+-------+ +-------+-----------+-----------+------------+------------+ ABI/TBIToday's ABIToday's TBIPrevious ABIPrevious  TBI +-------+-----------+-----------+------------+------------+ Right  0.33       amputation 0.43        amputation   +-------+-----------+-----------+------------+------------+ Left   1.13       0.4        0.57        0.19         +-------+-----------+-----------+------------+------------+ LT TBI taken on second toe, Great Toe previously amutated. Right ABIs appear decreased compared to prior study on 08/25/23. Left  09/23/2023 Medical Rec #: [id number]           Accession #:    [id number] Date of Birth: April 05, 1951            Patient Gender: M Patient Age:   72 years Exam Location:  [facility] Procedure:      VAS [place] ABI WITH/WO TBI Referring Phys: Raymond Walker --------------------------------------------------------------------------------  Indications: Peripheral artery disease. High Risk Factors: Hypertension.  Vascular Interventions: 08/27/23 LT CFA-POP BPG. 09/21/23 LT Digit Amputation. Limitations: Today's exam was limited due to involuntary patient movement. Comparison Study: Significant changes seen since prior exam 08/25/23 Performing Technologist: Shona Simpson  Examination Guidelines: A complete evaluation includes at minimum, Doppler waveform  signals and systolic blood pressure reading at the level of bilateral brachial, anterior tibial, and posterior tibial arteries, when vessel segments are accessible. Bilateral testing is considered an integral part of a complete examination. Photoelectric Plethysmograph (PPG) waveforms and toe systolic pressure readings are included as required and additional duplex testing as needed. Limited examinations for reoccurring indications may be performed as noted.  ABI Findings: +---------+------------------+-----+-------------------+----------+ Right    Rt Pressure (mmHg)IndexWaveform           Comment    +---------+------------------+-----+-------------------+----------+ Brachial 93                     dampened monophasic           +---------+------------------+-----+-------------------+----------+ PTA      49                0.33 dampened monophasic           +---------+------------------+-----+-------------------+----------+ DP                                                 Inaudible  +---------+------------------+-----+-------------------+----------+ Great Toe                                          Amputation +---------+------------------+-----+-------------------+----------+ +---------+------------------+-----+---------+-------+ Left     Lt Pressure (mmHg)IndexWaveform Comment +---------+------------------+-----+---------+-------+ Brachial 148                    triphasic        +---------+------------------+-----+---------+-------+ PTA      144               0.97 biphasic         +---------+------------------+-----+---------+-------+ DP       167               1.13 biphasic         +---------+------------------+-----+---------+-------+ Great Toe59                0.40 Abnormal         +---------+------------------+-----+---------+-------+ +-------+-----------+-----------+------------+------------+ ABI/TBIToday's ABIToday's TBIPrevious ABIPrevious  TBI +-------+-----------+-----------+------------+------------+ Right  0.33       amputation 0.43        amputation   +-------+-----------+-----------+------------+------------+ Left   1.13       0.4        0.57        0.19         +-------+-----------+-----------+------------+------------+ LT TBI taken on second toe, Great Toe previously amutated. Right ABIs appear decreased compared to prior study on 08/25/23. Left  Physician Discharge Summary   Raymond Walker. ZOX:[id number] DOB: [birth year] DOA: 09/20/2023  PCP: Babs Sciara, MD  Admit date: 09/20/2023 Discharge date: 09/26/2023  Admitted From: [facility] Disposition:  same Discharging physician: Lewie Chamber, MD Barriers to discharge: none  Recommendations at discharge: Follow up with podiatry this coming week   Discharge Condition: stable CODE STATUS: DNR Diet recommendation:  Diet Orders (From admission, onward)     Start     Ordered   09/26/23 0000  Diet - low sodium heart healthy        09/26/23 1210   09/21/23 1307  Diet regular Room service appropriate? Yes; Fluid consistency: Thin  Diet effective now       Question Answer Comment  Room service appropriate? Yes   Fluid consistency: Thin      09/21/23 1306            Hospital Course: [name] is a 72 yo male with PMH HTN, history of seizures, COPD, PAD who was referred to the hospital due to progressive worsening of his left foot.  He had recently undergone left common femoral to popliteal artery bypass with vascular surgery on 08/27/2023. Prior MRI left foot on 08/23/2023 showed osteomyelitis involving left first MT head and base of great toe proximal phalanx. Due to persistent wound and nonhealing, he was recommended for undergoing surgery with podiatry.  He underwent partial first ray amputation of the left foot on 09/21/2023. He tolerated surgery well and was able to ambulate with PT after surgery in his surgical shoe.  Assessment and Plan: * Osteomyelitis of left foot (HCC) - persistent left foot acute OM on recent MRI - s/p partial first ray amputation left with podiatry on 09/21/2023 -Patient has been ambulating well per PT and considered stable for returning to prior living facility and level of functioning; he is safe to ambulate independently in the halls with a rolling walker -Finish course of doxycycline; plan for 7 days postop - outpatient followup with  podiatry  GERD (gastroesophageal reflux disease) - Continue PPI  Essential hypertension - Blood pressure controlled without antihypertensives  PAD (peripheral artery disease) (HCC) - Continue aspirin, Plavix, Crestor  COPD (chronic obstructive pulmonary disease) (HCC) - On Symbicort outpatient  Aortic atherosclerosis (HCC) - Continue Crestor    Principal Diagnosis: Osteomyelitis of left foot (HCC)  Discharge Diagnoses: Active Hospital Problems   Diagnosis Date Noted   Osteomyelitis of left foot (HCC) 09/20/2023    Priority: 1.   GERD (gastroesophageal reflux disease) 12/06/2021   Essential hypertension 12/06/2021   PAD (peripheral artery disease) (HCC) 09/15/2019   COPD (chronic obstructive pulmonary disease) (HCC)    Aortic atherosclerosis (HCC) 10/14/2016   Tobacco abuse 12/03/2011    Resolved Hospital Problems  No resolved problems to display.     Discharge Instructions     Diet - low sodium heart healthy   Complete by: As directed    Discharge wound care:   Complete by: As directed    Keep dressing clean dry and intact until seen by Dr. Logan Bores in his office.  Weight-bear as tolerated in surgical shoes only for transitioning purposes.   Increase activity slowly   Complete by: As directed    Increase activity slowly   Complete by: As directed    Increase activity slowly   Complete by: As directed    Leave dressing on - Keep it clean, dry, and intact until clinic visit   Complete by: As directed

## 2023-09-26 NOTE — Progress Notes (Addendum)
Patient discharged back to facility that he came to the hospital from, no IV to remove at this time, discharge paperwork explained and provided to the patient, patient verbalized understanding, copy of discharge paperwork provided to facility, report called and given to receiving facility by RN.

## 2023-09-26 NOTE — [place] FL2 (Signed)
MEDICAID FL2 LEVEL OF CARE FORM     IDENTIFICATION  Patient Name: Raymond Walker. Birthdate: August 28, 1951 Sex: male Admission Date (Current Location): 09/20/2023  [place] and [place] Number:  Haynes Bast [id number] P Facility and Address:  [facility],  [address], [place] [address]      Provider Number: [id number]  Attending Physician Name and Address:  Lewie Chamber, MD  Relative Name and Phone Number:  Zack Seal (niece) Ph: [phone]    Current Level of Care: Hospital Recommended Level of Care: Assisted Living Facility Prior Approval Number:    Date Approved/Denied: 09/22/23 PASRR Number: [id number]  Discharge Plan: Other (Comment) ([facility])    Current Diagnoses: Patient Active Problem List   Diagnosis Date Noted   Osteomyelitis of left foot (HCC) 09/20/2023   Osteomyelitis of foot, left, acute (HCC) 08/22/2023   Status post transmetatarsal amputation of foot, 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   Right ureteral stone 01/01/2022   Abdominal pain 01/01/2022   Sepsis secondary to UTI (HCC) 12/06/2021   Leukocytosis 12/06/2021   Hypoalbuminemia due to protein-calorie malnutrition (HCC) 12/06/2021   Lactic acidosis 12/06/2021   AKI (acute kidney injury) (HCC) 12/06/2021   Essential hypertension 12/06/2021   Mixed hyperlipidemia 12/06/2021   GERD (gastroesophageal reflux disease) 12/06/2021   Nephrolithiasis 11/21/2021   Low back pain 09/19/2021   Cognitive dysfunction 06/23/2020   History of seizures 05/31/2020   Erectile dysfunction 05/02/2020   Caregiver with fatigue 05/02/2020   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HCC)    Goals of care, counseling/discussion    Palliative care by specialist    DNR (do not resuscitate)    Vitamin D deficiency 01/24/2020   Sepsis (HCC) 01/23/2020   Altered mental status    Hypokalemia    History of kidney stones    Hematuria    UTI (urinary tract infection)    CHRONIC Bladder outlet obstruction     PAD (peripheral artery disease) (HCC) 09/15/2019   Protein-calorie malnutrition, severe 08/21/2019   Cellulitis of right lower extremity    Impaired glucose tolerance    COPD (chronic obstructive pulmonary disease) (HCC)    Thrombocytopenia (HCC) 08/10/2018   Aortic atherosclerosis (HCC) 10/14/2016   Coronary artery calcification seen on CAT scan 10/14/2016   Other emphysema (HCC) 10/14/2016   Elevated blood pressure 12/03/2011   Chest pain 12/03/2011   Tobacco abuse 12/03/2011   Right leg pain 12/03/2011    Orientation RESPIRATION BLADDER Height & Weight     Self, Place, Situation, Time  Normal Continent Weight:   Height:     BEHAVIORAL SYMPTOMS/MOOD NEUROLOGICAL BOWEL NUTRITION STATUS      Continent Diet (Regular)  AMBULATORY STATUS COMMUNICATION OF NEEDS Skin   Independent Verbally Surgical wounds (Surgical incision on left foot)                       Personal Care Assistance Level of Assistance  Bathing, Feeding, Dressing Bathing Assistance: Independent Feeding assistance: Independent Dressing Assistance: Independent     Functional Limitations Info  Speech, Hearing, Sight Sight Info: Adequate Hearing Info: Adequate Speech Info: Adequate    SPECIAL CARE FACTORS FREQUENCY                       Contractures Contractures Info: Not present    Additional Factors Info  Code Status, Allergies, Insulin Sliding Scale Code Status Info: FULL Allergies Info: NKA   Insulin Sliding Scale Info: See discharge summary  Current Medications (09/26/2023):  This is the current hospital active medication list Current Facility-Administered Medications  Medication Dose Route Frequency Provider Last Rate Last Admin   acetaminophen (TYLENOL) tablet 650 mg  650 mg Oral Q6H PRN Bobette Mo, MD   650 mg at 09/26/23 0004   Or   acetaminophen (TYLENOL) suppository 650 mg  650 mg Rectal Q6H PRN Bobette Mo, MD       ascorbic acid (VITAMIN C) tablet 500  mg  500 mg Oral BID Alwyn Ren, MD   500 mg at 09/26/23 4098   aspirin EC tablet 81 mg  81 mg Oral Daily Lewie Chamber, MD   81 mg at 09/26/23 0848   azelastine (ASTELIN) 0.1 % nasal spray 2 spray  2 spray Each Nare BID Bobette Mo, MD       clopidogrel (PLAVIX) tablet 75 mg  75 mg Oral Daily Lewie Chamber, MD   75 mg at 09/25/23 1747   doxycycline (VIBRA-TABS) tablet 100 mg  100 mg Oral Q12H Alwyn Ren, MD   100 mg at 09/25/23 1058   enoxaparin (LOVENOX) injection 40 mg  40 mg Subcutaneous Q24H Bobette Mo, MD   40 mg at 09/20/23 1808   feeding supplement (ENSURE ENLIVE / ENSURE PLUS) liquid 237 mL  237 mL Oral BID BM Alwyn Ren, MD   237 mL at 09/26/23 0851   insulin aspart (novoLOG) injection 0-15 Units  0-15 Units Subcutaneous TID WC Bobette Mo, MD       mometasone-formoterol [facility]) 200-5 MCG/ACT inhaler 2 puff  2 puff Inhalation BID Bobette Mo, MD       ondansetron [facility]) tablet 4 mg  4 mg Oral Q6H PRN Bobette Mo, MD       Or   ondansetron [facility]) injection 4 mg  4 mg Intravenous Q6H PRN Bobette Mo, MD       oxyCODONE (Oxy IR/ROXICODONE) immediate release tablet 5 mg  5 mg Oral Q4H PRN Bobette Mo, MD   5 mg at 09/26/23 0004   pantoprazole (PROTONIX) EC tablet 40 mg  40 mg Oral Daily Bobette Mo, MD   40 mg at 09/25/23 1056   rosuvastatin (CRESTOR) tablet 20 mg  20 mg Oral Daily Bobette Mo, MD   20 mg at 09/26/23 1191   zinc sulfate capsule 220 mg  220 mg Oral Daily Alwyn Ren, MD   220 mg at 09/23/23 1433    Discharge Medications: Please see discharge summary for a list of discharge medications. STOP taking these medications     amoxicillin-clavulanate 875-125 MG tablet Commonly known as: AUGMENTIN    ondansetron 4 MG disintegrating tablet Commonly known as: ZOFRAN-ODT    oxyCODONE 5 MG immediate release tablet Commonly known as: Oxy IR/ROXICODONE      TAKE  these medications     acetaminophen 325 MG tablet Commonly known as: TYLENOL Take 2 tablets (650 mg total) by mouth every 4 (four) hours as needed for mild pain or moderate pain (or Fever >/= 101).    albuterol 108 (90 Base) MCG/ACT inhaler Commonly known as: VENTOLIN HFA INHALE 2 PUFFS INTO THE LUNGS EVERY 4 HOURS AS NEEDED FOR WHEEZING OR SHORTNESS OF BREATH    aspirin EC 81 MG tablet Take 1 tablet (81 mg total) by mouth daily. Swallow whole.    azelastine 0.1 % nasal spray Commonly known as: ASTELIN Place 2 sprays into both nostrils 2 (two)  times daily.    budesonide-formoterol 160-4.5 MCG/ACT inhaler Commonly known as: SYMBICORT Inhale 2 puffs into the lungs 2 (two) times daily.    clopidogrel 75 MG tablet Commonly known as: PLAVIX Take 1 tablet (75 mg total) by mouth daily.    cyanocobalamin 1000 MCG tablet Commonly known as: VITAMIN B12 Take 1 tablet (1,000 mcg total) by mouth daily.    doxycycline 100 MG tablet Commonly known as: VIBRA-TABS Take 1 tablet (100 mg total) by mouth every 12 (twelve) hours for 2 days.    pantoprazole 40 MG tablet Commonly known as: PROTONIX TAKE (1) TABLET BY MOUTH ONCE DAILY. What changed: See the new instructions.    rosuvastatin 20 MG tablet Commonly known as: CRESTOR Take 1 tablet (20 mg total) by mouth daily.      Relevant Imaging Results:  Relevant Lab Results:   Additional Information SSN: [id number]  Ewing Schlein, LCSW

## 2023-09-26 NOTE — TOC Transition Note (Signed)
Transition of Care [facility]) - CM/SW Discharge Note  Patient Details  Name: Raymond Walker. MRN: [id number] Date of Birth: [birth year]  [facility]) CM/SW Contact:  Ewing Schlein, LCSW Phone Number: 09/26/2023, 1:15 PM  Clinical Narrative: Babette Relic has assessed the patient and confirmed the patient can return to the [facility]F today. FL2 completed. FL2 and discharge summary faxed to facility for review [phone]). CSW updated niece. TOC signing off.   Final next level of care: Assisted Living Barriers to Discharge: Barriers Resolved  Patient Goals and CMS Choice CMS Medicare.gov Compare Post Acute Care list provided to:: Patient Choice offered to / list presented to : [facility] POA / Guardian, Patient  Discharge Placement        Patient chooses bed at: Other - please specify in the comment section below: Patient to be transferred to facility by: [facility] Name of family member notified: Zack Seal (niece) Patient and family notified of of transfer: 09/26/23  Discharge Plan and Services Additional resources added to the After Visit Summary for   In-house Referral: Clinical Social Work Post Acute Care Choice: NA          DME Arranged: N/A DME Agency: NA  Social Determinants of Health (SDOH) Interventions SDOH Screenings   Food Insecurity: Food Insecurity Present (09/20/2023)  Housing: Low Risk  (09/20/2023)  Transportation Needs: No Transportation Needs (09/20/2023)  Utilities: Not At Risk (09/20/2023)  Alcohol Screen: Low Risk  (09/19/2021)  Depression (PHQ2-9): Medium Risk (09/11/2023)  Financial Resource Strain: High Risk (03/13/2023)  Physical Activity: Unknown (03/13/2023)  Social Connections: Socially Isolated (03/13/2023)  Stress: No Stress Concern Present (03/13/2023)  Tobacco Use: High Risk (09/20/2023)   Readmission Risk Interventions    12/12/2021    2:38 PM  Readmission Risk Prevention Plan  Transportation Screening Complete  PCP or Specialist Appt  within 5-7 Days Complete  Home Care Screening Complete  Medication Review (RN CM) Complete

## 2023-09-26 NOTE — Consult Note (Signed)
Value-Based Care Institute  [facility] Inpatient Consult   09/26/2023  Raymond Walker Sep 24, 1951 [id number]  [facility] [[facility]]  Accountable Care Organization [ACO] Patient:  Raymond Walker [SNP] with Medicaid  Primary Care Provider:  Babs Sciara, MD with [facility] Liaison remote coverage review for patient admitted to [facility] showing on [facility] banner with less than 30 days readmission  Noted that patient is from [facility] prior to admission  Review of patient's medical record for past medical history and membership affiliate roster reveals this Patient is found to be in a [employer] SNP [Special Needs Program] plan. This plan is listed to provide care management for the member.   For additional questions or referrals please contact:    Plan: Will sign off.    Raymond Shanks, Raymond Walker, Raymond Walker, Raymond Walker [employer], [facility] Liaison Direct Dial: [phone] or secure chat Website: [email or web address]

## 2023-09-27 ENCOUNTER — Other Ambulatory Visit: Payer: Self-pay | Admitting: Nurse Practitioner

## 2023-09-27 ENCOUNTER — Telehealth: Payer: Self-pay

## 2023-09-27 NOTE — Telephone Encounter (Signed)
[facility] called in today to inform us that the pt came back into their care today and pt would like his two nasal sprays to be as needed instead of 2 times daily. [facility] needs new ordered sent to them stating that these are now prn. Please advise

## 2023-09-29 LAB — AEROBIC/ANAEROBIC CULTURE W GRAM STAIN (SURGICAL/DEEP WOUND)
Culture: NO GROWTH
Gram Stain: NONE SEEN

## 2023-09-30 ENCOUNTER — Other Ambulatory Visit: Payer: Self-pay | Admitting: Nurse Practitioner

## 2023-09-30 DIAGNOSIS — M6281 Muscle weakness (generalized): Secondary | ICD-10-CM | POA: Diagnosis not present

## 2023-09-30 MED ORDER — AZELASTINE HCL 0.1 % NA SOLN: 2.0000 | Freq: Two times a day (BID) | NASAL | 12 refills | Status: DC

## 2023-09-30 NOTE — Telephone Encounter (Signed)
Order changed to PRN at [facility].

## 2023-10-01 ENCOUNTER — Ambulatory Visit: Payer: Medicare HMO | Admitting: Family Medicine

## 2023-10-01 DIAGNOSIS — M6281 Muscle weakness (generalized): Secondary | ICD-10-CM | POA: Diagnosis not present

## 2023-10-02 ENCOUNTER — Ambulatory Visit (INDEPENDENT_AMBULATORY_CARE_PROVIDER_SITE_OTHER): Payer: Medicare HMO | Admitting: Podiatry

## 2023-10-02 ENCOUNTER — Encounter: Payer: Self-pay | Admitting: Podiatry

## 2023-10-02 DIAGNOSIS — M86172 Other acute osteomyelitis, left ankle and foot: Secondary | ICD-10-CM

## 2023-10-02 NOTE — Progress Notes (Signed)
Chief Complaint  Patient presents with   Routine Post Op    Subjective:  Patient presents today status post partial first ray amputation of the left foot.  Performed inpatient 09/21/2023.  Patient doing well.  He has been minimal WBAT in the surgical shoe as instructed.  Dressings have been clean dry and intact.  No new complaints  Past Medical History:  Diagnosis Date   Caregiver with fatigue 05/02/2020   COPD (chronic obstructive pulmonary disease) (HCC)    Deformity    equinus and metatarsal   Erectile dysfunction    Erectile dysfunction 05/02/2020   Full dentures    History of kidney stones    History of seizures 05/31/2020   Between ages 63 and 6   Hypertension    Impaired glucose tolerance    Pancreatitis, recurrent    Pneumonia    Status post transmetatarsal amputation of foot, 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   Thrombocytopenia (HCC) 08/10/2018   Staying in the low 100s will follow closely    Past Surgical History:  Procedure Laterality Date   ABDOMINAL AORTOGRAM W/LOWER EXTREMITY Bilateral 08/20/2019   Procedure: ABDOMINAL AORTOGRAM W/LOWER EXTREMITY;  Surgeon: Cephus Shelling, MD;  Location: [facility] INVASIVE CV LAB;  Service: Cardiovascular;  Laterality: Bilateral;   ABDOMINAL AORTOGRAM W/LOWER EXTREMITY N/A 08/26/2023   Procedure: ABDOMINAL AORTOGRAM W/LOWER EXTREMITY;  Surgeon: Maeola Harman, MD;  Location: [facility] INVASIVE CV LAB;  Service: Cardiovascular;  Laterality: N/A;   ACHILLES TENDON SURGERY Right 08/26/2020   Procedure: ACHILLES TENDON LENGTHENING;  Surgeon: Edwin Cap, DPM;  Location: [facility] OR;  Service: Podiatry;  Laterality: Right;   AMPUTATION Right 09/15/2019   Procedure: AMPUTATION RIGHT TOES One, Two, And Three;  Surgeon: Maeola Harman, MD;  Location[facility] OR;  Service: Vascular;  Laterality: Right;   AMPUTATION Left 09/21/2023   Procedure: AMPUTATION DIGIT;  Surgeon: Felecia Shelling, DPM;  Location: [facility] ORS;  Service: Orthopedics/Podiatry;   Laterality: Left;   APPENDECTOMY     CATARACT EXTRACTION W/PHACO Right 05/02/2018   Procedure: CATARACT EXTRACTION PHACO AND INTRAOCULAR LENS PLACEMENT (IOC);  Surgeon: Fabio Pierce, MD;  Location: [facility] ORS;  Service: Ophthalmology;  Laterality: Right;  CDE: 11.11   CATARACT EXTRACTION W/PHACO Left 07/04/2018   Procedure: CATARACT EXTRACTION PHACO AND INTRAOCULAR LENS PLACEMENT (IOC);  Surgeon: Fabio Pierce, MD;  Location: [facility] ORS;  Service: Ophthalmology;  Laterality: Left;  CDE: 7.15   CHOLECYSTECTOMY     CYSTOSCOPY W/ URETERAL STENT PLACEMENT Right 11/22/2021   Procedure: CYSTOSCOPY WITH RETROGRADE PYELOGRAM/URETERAL STENT PLACEMENT;  Surgeon: Marcine Matar, MD;  Location: [facility] ORS;  Service: Urology;  Laterality: Right;   CYSTOSCOPY WITH RETROGRADE PYELOGRAM, URETEROSCOPY AND STENT PLACEMENT Right 02/01/2022   Procedure: CYSTOSCOPY WITH RETROGRADE PYELOGRAM, URETEROSCOPY AND STENT PLACEMENT;  Surgeon: Malen Gauze, MD;  Location: [facility] ORS;  Service: Urology;  Laterality: Right;   ENDARTERECTOMY FEMORAL Left 08/27/2023   Procedure: RIGHT FEMORAL ENDARTERECTOMY;  Surgeon: Maeola Harman, MD;  Location: [facility] OR;  Service: Vascular;  Laterality: Left;   FEMORAL-POPLITEAL BYPASS GRAFT Right 09/15/2019   Procedure: Right BYPASS GRAFT FEMORAL to Above Knee POPLITEAL ARTERY;  Surgeon: Maeola Harman, MD;  Location: [facility] OR;  Service: Vascular;  Laterality: Right;   FEMORAL-POPLITEAL BYPASS GRAFT Right 01/26/2020   Procedure: IRRIGATION AND DEBRIDEMENT RIGHT FEMORAL POPLITEAL BYPASS SITE;  Surgeon: Maeola Harman, MD;  Location: [facility] OR;  Service: Vascular;  Laterality: Right;   FEMORAL-POPLITEAL BYPASS GRAFT Left 08/27/2023   Procedure: BYPASS GRAFT LEFT  COMMON FEMORAL-POPLITEAL ARTERY;  Surgeon: Maeola Harman, MD;  Location: [facility] OR;  Service: Vascular;  Laterality: Left;   HOLMIUM LASER APPLICATION Right 02/01/2022   Procedure: HOLMIUM LASER APPLICATION;   Surgeon: Malen Gauze, MD;  Location: [facility]S;  Service: Urology;  Laterality: Right;   MULTIPLE TOOTH EXTRACTIONS     ORCHIECTOMY     PERIPHERAL VASCULAR INTERVENTION Left 08/20/2019   Procedure: PERIPHERAL VASCULAR INTERVENTION;  Surgeon: Cephus Shelling, MD;  Location: [facility] INVASIVE CV LAB;  Service: Cardiovascular;  Laterality: Left;  common/external iliac   PERIPHERAL VASCULAR INTERVENTION Right 08/26/2023   Procedure: PERIPHERAL VASCULAR INTERVENTION;  Surgeon: Maeola Harman, MD;  Location: [facility] INVASIVE CV LAB;  Service: Cardiovascular;  Laterality: Right;  External Illiac   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   Procedure: TRANSMETATARSAL AMPUTATION;  Surgeon: Maeola Harman, MD;  Location: [facility] OR;  Service: Vascular;  Laterality: Right;   TRANSMETATARSAL AMPUTATION Right 08/26/2020   Procedure: TRANSMETATARSAL AMPUTATION REVISION;  Surgeon: Edwin Cap, DPM;  Location: [facility] OR;  Service: Podiatry;  Laterality: Right;   VASECTOMY     WOUND DEBRIDEMENT Right 08/26/2020   Procedure: EXCISION WOUND;  Surgeon: Edwin Cap, DPM;  Location: [facility] OR;  Service: Podiatry;  Laterality: Right;    No Known Allergies  Objective/Physical Exam Neurovascular status intact.  Incision well coapted with sutures and staples intact. No sign of infectious process noted. No dehiscence. No active bleeding noted.  No edema or erythema concerning for residual cellulitis or acute infection  Radiographic Exam LT foot 10/02/2023:  Osteotomy noted at the base of the first metatarsal.  Clean osteotomy site.  There is some radiolucency consistent with focal areas of gas within the tissue at the osteotomy site.  There is no tracking proximal.  Will plan to observe for now.  Assessment: 1. s/p partial first ray amputation left. DOS: 09/21/2023   Plan of Care:  -Patient was evaluated. X-rays reviewed - Patient doing very well clinically.  Will observe the radiolucency within the  soft tissue of the foot visualized on x-ray for now -Continue oral antibiotics as prescribed at discharge -Continue minimal WBAT surgical shoe mostly for transition purposes only -Return to clinic 2 weeks suture and staple removal   Felecia Shelling, DPM [facility]  Dr. Felecia Shelling, DPM    [address] [place], [place] [address]                Office [phone]  Fax [phone]

## 2023-10-03 ENCOUNTER — Encounter: Payer: Self-pay | Admitting: Nurse Practitioner

## 2023-10-03 ENCOUNTER — Ambulatory Visit: Payer: Medicare HMO | Admitting: Nurse Practitioner

## 2023-10-03 VITALS — BP 118/74 | Temp 98.8°F | Ht 71.0 in | Wt 150.6 lb

## 2023-10-03 DIAGNOSIS — M6281 Muscle weakness (generalized): Secondary | ICD-10-CM | POA: Diagnosis not present

## 2023-10-03 DIAGNOSIS — Z09 Encounter for follow-up examination after completed treatment for conditions other than malignant neoplasm: Secondary | ICD-10-CM | POA: Diagnosis not present

## 2023-10-03 DIAGNOSIS — J449 Chronic obstructive pulmonary disease, unspecified: Secondary | ICD-10-CM

## 2023-10-03 MED ORDER — BUDESONIDE-FORMOTEROL FUMARATE 160-4.5 MCG/ACT IN AERO: 2.0000 | INHALATION_SPRAY | Freq: Two times a day (BID) | RESPIRATORY_TRACT | 3 refills | Status: DC

## 2023-10-03 NOTE — Progress Notes (Signed)
Subjective:    Patient ID: Raymond Butt., male    DOB: 10/14/1951, 72 y.o.   MRN: [id number]  HPI Presents for follow-up after hospitalization for osteomyelitis and amputation of a toe on his left foot.  Patient states he is doing good.  Had an office visit yesterday with his specialist.  Currently on antibiotic.  Will return to the surgeon in 2 weeks for staple removal.  A staff member from [facility] is present with him today. Would like to switch his Symbicort inhaler to as needed.  States he does not feel like he needs this every day and would like to have the option of not using it.  Has his albuterol inhaler in his room for any urgent needs.   Review of Systems  Constitutional:  Negative for fever.  Respiratory:  Negative for cough, chest tightness, shortness of breath and wheezing.   Cardiovascular:  Negative for chest pain.  Skin:  Positive for wound.       Surgical wound left foot with dressing and surgical shoe.       Objective:   Physical Exam NAD.  Alert, oriented.  Calm cheerful affect.  Lungs breath sounds diminished but clear.  Heart regular rate rhythm. Today's Vitals   10/03/23 1031  BP: 118/74  Temp: 98.8 F (37.1 C)  Weight: 150 lb 9.6 oz (68.3 kg)  Height: 5\' 11"  (1.803 m)   Body mass index is 21 kg/m.        Assessment & Plan:   Problem List Items Addressed This Visit       Respiratory   COPD (chronic obstructive pulmonary disease) (HCC)   Relevant Medications   budesonide-formoterol (SYMBICORT) 160-4.5 MCG/ACT inhaler   Other Visit Diagnoses     Hospital discharge follow-up    -  Primary      Meds ordered this encounter  Medications   budesonide-formoterol (SYMBICORT) 160-4.5 MCG/ACT inhaler    Sig: Inhale 2 puffs into the lungs 2 (two) times daily. Prn for COPD    Dispense:  1 each    Refill:  3    Order Specific Question:   Supervising Provider    Answer:   Lilyan Punt A [[id number]]   Symbicort switched to a as needed medication  with orders given for assisted living as well as new order sent into the pharmacy. Follow-up with surgeon as planned. Routine follow-up here.

## 2023-10-08 ENCOUNTER — Telehealth: Payer: Self-pay | Admitting: Nurse Practitioner

## 2023-10-08 DIAGNOSIS — M6281 Muscle weakness (generalized): Secondary | ICD-10-CM | POA: Diagnosis not present

## 2023-10-08 NOTE — Telephone Encounter (Signed)
Patient was seen 10/17 and was discuss in room that more labs needed to be done but none were put in. Raymond Walker from [facility] called to check on orders she had written down in her book labs done this week lease advise

## 2023-10-08 NOTE — Telephone Encounter (Signed)
I recommend CMP and CBC and TIBC ferritin Iron deficient anemia, hypoalbuminemia, high risk medication If he is having any significant setbacks we may need to see him sooner otherwise his next appointment with [place] is January thank you

## 2023-10-09 ENCOUNTER — Other Ambulatory Visit: Payer: Self-pay

## 2023-10-09 DIAGNOSIS — D649 Anemia, unspecified: Secondary | ICD-10-CM

## 2023-10-09 DIAGNOSIS — E8809 Other disorders of plasma-protein metabolism, not elsewhere classified: Secondary | ICD-10-CM

## 2023-10-09 DIAGNOSIS — Z79899 Other long term (current) drug therapy: Secondary | ICD-10-CM

## 2023-10-09 DIAGNOSIS — E611 Iron deficiency: Secondary | ICD-10-CM

## 2023-10-09 NOTE — Telephone Encounter (Signed)
Labs have been order, called [facility] and spoke to [place] informed her of this.

## 2023-10-10 DIAGNOSIS — M6281 Muscle weakness (generalized): Secondary | ICD-10-CM | POA: Diagnosis not present

## 2023-10-15 DIAGNOSIS — M6281 Muscle weakness (generalized): Secondary | ICD-10-CM | POA: Diagnosis not present

## 2023-10-16 ENCOUNTER — Ambulatory Visit (INDEPENDENT_AMBULATORY_CARE_PROVIDER_SITE_OTHER): Payer: Medicare HMO | Admitting: Podiatry

## 2023-10-16 DIAGNOSIS — M86172 Other acute osteomyelitis, left ankle and foot: Secondary | ICD-10-CM

## 2023-10-16 NOTE — Progress Notes (Signed)
Chief Complaint  Patient presents with   Routine Post Op    DENIES N/V/F/C/SOB, NO PAIN, SWELLING OR DRAINAGE, "DOING WELL"    Subjective:  Patient presents today status post partial first ray amputation of the left foot.  Performed inpatient 09/21/2023.  Patient is doing very well.  WBAT in the surgical shoe as instructed.  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   Thrombocytopenia (HCC) 08/10/2018   Staying in the low 100s will follow closely    Past Surgical History:  Procedure Laterality Date   ABDOMINAL AORTOGRAM W/LOWER EXTREMITY Bilateral 08/20/2019   Procedure: ABDOMINAL AORTOGRAM W/LOWER EXTREMITY;  Surgeon: Cephus Shelling, MD;  Location: [facility] INVASIVE CV LAB;  Service: Cardiovascular;  Laterality: Bilateral;   ABDOMINAL AORTOGRAM W/LOWER EXTREMITY N/A 08/26/2023   Procedure: ABDOMINAL AORTOGRAM W/LOWER EXTREMITY;  Surgeon: Maeola Harman, MD;  Location: [facility] INVASIVE CV LAB;  Service: Cardiovascular;  Laterality: N/A;   ACHILLES TENDON SURGERY Right 08/26/2020   Procedure: ACHILLES TENDON LENGTHENING;  Surgeon: Edwin Cap, DPM;  Location: [facility] OR;  Service: Podiatry;  Laterality: Right;   AMPUTATION Right 09/15/2019   Procedure: AMPUTATION RIGHT TOES One, Two, And Three;  Surgeon: Maeola Harman, MD;  Location: [facility] OR;  Service: Vascular;  Laterality: Right;   AMPUTATION Left 09/21/2023   Procedure: AMPUTATION DIGIT;  Surgeon: Felecia Shelling, DPM;  Location: [facility] ORS;  Service:  Orthopedics/Podiatry;  Laterality: Left;   APPENDECTOMY     CATARACT EXTRACTION W/PHACO Right 05/02/2018   Procedure: CATARACT EXTRACTION PHACO AND INTRAOCULAR LENS PLACEMENT (IOC);  Surgeon: Fabio Pierce, MD;  Location: [facility] ORS;  Service: Ophthalmology;  Laterality: Right;  CDE: 11.11   CATARACT EXTRACTION W/PHACO Left 07/04/2018   Procedure: CATARACT EXTRACTION PHACO AND INTRAOCULAR LENS PLACEMENT (IOC);  Surgeon: Fabio Pierce, MD;  Location: [facility] ORS;  Service: Ophthalmology;  Laterality: Left;  CDE: 7.15   CHOLECYSTECTOMY     CYSTOSCOPY W/ URETERAL STENT PLACEMENT Right 11/22/2021   Procedure: CYSTOSCOPY WITH RETROGRADE PYELOGRAM/URETERAL STENT PLACEMENT;  Surgeon: Marcine Matar, MD;  Location: [facility] ORS;  Service: Urology;  Laterality: Right;   CYSTOSCOPY WITH RETROGRADE PYELOGRAM, URETEROSCOPY AND STENT PLACEMENT Right 02/01/2022   Procedure: CYSTOSCOPY WITH RETROGRADE PYELOGRAM, URETEROSCOPY AND STENT PLACEMENT;  Surgeon: Malen Gauze, MD;  Location: [facility]S;  Service: Urology;  Laterality: Right;   ENDARTERECTOMY FEMORAL Left 08/27/2023   Procedure: RIGHT FEMORAL ENDARTERECTOMY;  Surgeon: Maeola Harman, MD;  Location: [facility] OR;  Service: Vascular;  Laterality: Left;   FEMORAL-POPLITEAL BYPASS GRAFT Right 09/15/2019   Procedure: Right BYPASS GRAFT FEMORAL to Above Knee POPLITEAL ARTERY;  Surgeon: Maeola Harman, MD;  Location: [facility] OR;  Service: Vascular;  Laterality: Right;   FEMORAL-POPLITEAL BYPASS GRAFT Right 01/26/2020   Procedure: IRRIGATION AND DEBRIDEMENT RIGHT FEMORAL POPLITEAL BYPASS SITE;  Surgeon: Maeola Harman, MD;  Location: [facility] OR;  Service: Vascular;  Laterality: Right;   FEMORAL-POPLITEAL BYPASS GRAFT Left 08/27/2023   Procedure: BYPASS  GRAFT LEFT COMMON FEMORAL-POPLITEAL ARTERY;  Surgeon: Maeola Harman, MD;  Location: [facility] OR;  Service: Vascular;  Laterality: Left;   HOLMIUM LASER APPLICATION Right 02/01/2022   Procedure: HOLMIUM  LASER APPLICATION;  Surgeon: Malen Gauze, MD;  Location: [facility];  Service: Urology;  Laterality: Right;   MULTIPLE TOOTH EXTRACTIONS     ORCHIECTOMY     PERIPHERAL VASCULAR INTERVENTION Left 08/20/2019   Procedure: PERIPHERAL VASCULAR INTERVENTION;  Surgeon: Cephus Shelling, MD;  Location: [facility] INVASIVE CV LAB;  Service: Cardiovascular;  Laterality: Left;  common/external iliac   PERIPHERAL VASCULAR INTERVENTION Right 08/26/2023   Procedure: PERIPHERAL VASCULAR INTERVENTION;  Surgeon: Maeola Harman, MD;  Location: [facility] INVASIVE CV LAB;  Service: Cardiovascular;  Laterality: Right;  External Illiac   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   Procedure: TRANSMETATARSAL AMPUTATION;  Surgeon: Maeola Harman, MD;  Location: [facility] OR;  Service: Vascular;  Laterality: Right;   TRANSMETATARSAL AMPUTATION Right 08/26/2020   Procedure: TRANSMETATARSAL AMPUTATION REVISION;  Surgeon: Edwin Cap, DPM;  Location: [facility] OR;  Service: Podiatry;  Laterality: Right;   VASECTOMY     WOUND DEBRIDEMENT Right 08/26/2020   Procedure: EXCISION WOUND;  Surgeon: Edwin Cap, DPM;  Location: [facility] OR;  Service: Podiatry;  Laterality: Right;    No Known Allergies  Objective/Physical Exam Neurovascular status intact.  Incision well coapted with sutures and staples intact. No sign of infectious process noted. No dehiscence. No active bleeding noted.  No edema or erythema concerning for residual cellulitis or acute infection  Assessment: 1. s/p partial first ray amputation left. DOS: 09/21/2023   Plan of Care:  -Patient was evaluated.  -Sutures and staples removed today -From a surgical standpoint the patient may now discontinue the postop shoe.  Recommend good supportive shoes and sneakers -Patient is minimally ambulatory mostly in a wheelchair -Return to clinic 3 months routine footcare and follow-up x-ray   Felecia Shelling, DPM [facility]  Dr. Felecia Shelling, DPM     [address] [place], [place] [address]                Office [phone]  Fax [phone]

## 2023-10-17 ENCOUNTER — Ambulatory Visit: Payer: Medicare HMO | Admitting: Family Medicine

## 2023-10-17 DIAGNOSIS — M6281 Muscle weakness (generalized): Secondary | ICD-10-CM | POA: Diagnosis not present

## 2023-10-18 DIAGNOSIS — E8809 Other disorders of plasma-protein metabolism, not elsewhere classified: Secondary | ICD-10-CM | POA: Diagnosis not present

## 2023-10-18 DIAGNOSIS — Z79899 Other long term (current) drug therapy: Secondary | ICD-10-CM | POA: Diagnosis not present

## 2023-10-18 DIAGNOSIS — E611 Iron deficiency: Secondary | ICD-10-CM | POA: Diagnosis not present

## 2023-10-18 DIAGNOSIS — D649 Anemia, unspecified: Secondary | ICD-10-CM | POA: Diagnosis not present

## 2023-10-19 LAB — COMPREHENSIVE METABOLIC PANEL
ALT: 8 [IU]/L (ref 0–44)
AST: 11 [IU]/L (ref 0–40)
Albumin: 3.8 g/dL (ref 3.8–4.8)
Alkaline Phosphatase: 124 [IU]/L — ABNORMAL HIGH (ref 44–121)
BUN/Creatinine Ratio: 13 (ref 10–24)
BUN: 11 mg/dL (ref 8–27)
Bilirubin Total: 0.3 mg/dL (ref 0.0–1.2)
CO2: 23 mmol/L (ref 20–29)
Calcium: 9 mg/dL (ref 8.6–10.2)
Chloride: 102 mmol/L (ref 96–106)
Creatinine, Ser: 0.88 mg/dL (ref 0.76–1.27)
Globulin, Total: 3.2 g/dL (ref 1.5–4.5)
Glucose: 129 mg/dL — ABNORMAL HIGH (ref 70–99)
Potassium: 3.7 mmol/L (ref 3.5–5.2)
Sodium: 144 mmol/L (ref 134–144)
Total Protein: 7 g/dL (ref 6.0–8.5)
eGFR: 91 mL/min/{1.73_m2} (ref >59)

## 2023-10-19 LAB — CBC WITH DIFFERENTIAL/PLATELET
Basophils Absolute: 0.1 10*3/uL (ref 0.0–0.2)
Basos: 1 %
EOS (ABSOLUTE): 0.2 10*3/uL (ref 0.0–0.4)
Eos: 2 %
Hematocrit: 37.8 % (ref 37.5–51.0)
Hemoglobin: 11.9 g/dL — ABNORMAL LOW (ref 13.0–17.7)
Immature Grans (Abs): 0 10*3/uL (ref 0.0–0.1)
Immature Granulocytes: 0 %
Lymphocytes Absolute: 1.4 10*3/uL (ref 0.7–3.1)
Lymphs: 19 %
MCH: 27 pg (ref 26.6–33.0)
MCHC: 31.5 g/dL (ref 31.5–35.7)
MCV: 86 fL (ref 79–97)
Monocytes Absolute: 0.6 10*3/uL (ref 0.1–0.9)
Monocytes: 8 %
Neutrophils Absolute: 5.3 10*3/uL (ref 1.4–7.0)
Neutrophils: 70 %
Platelets: 208 10*3/uL (ref 150–450)
RBC: 4.4 x10E6/uL (ref 4.14–5.80)
RDW: 14.9 % (ref 11.6–15.4)
WBC: 7.6 10*3/uL (ref 3.4–10.8)

## 2023-10-19 LAB — IRON,TIBC AND FERRITIN PANEL
Ferritin: 70 ng/mL (ref 30–400)
Iron Saturation: 11 % — ABNORMAL LOW (ref 15–55)
Iron: 28 ug/dL — ABNORMAL LOW (ref 38–169)
Total Iron Binding Capacity: 261 ug/dL (ref 250–450)
UIBC: 233 ug/dL (ref 111–343)

## 2023-10-21 MED ORDER — IRON (FERROUS SULFATE) 325 (65 FE) MG PO TABS: ORAL_TABLET | ORAL | 0 refills | Status: DC

## 2023-10-21 NOTE — Addendum Note (Signed)
Addended by: Elizbeth Squires on: 10/21/2023 02:52 PM   Modules accepted: Orders

## 2023-10-22 DIAGNOSIS — M6281 Muscle weakness (generalized): Secondary | ICD-10-CM | POA: Diagnosis not present

## 2023-10-24 DIAGNOSIS — M6281 Muscle weakness (generalized): Secondary | ICD-10-CM | POA: Diagnosis not present

## 2023-10-29 DIAGNOSIS — M6281 Muscle weakness (generalized): Secondary | ICD-10-CM | POA: Diagnosis not present

## 2023-10-31 DIAGNOSIS — M6281 Muscle weakness (generalized): Secondary | ICD-10-CM | POA: Diagnosis not present

## 2023-11-05 DIAGNOSIS — M6281 Muscle weakness (generalized): Secondary | ICD-10-CM | POA: Diagnosis not present

## 2023-11-06 ENCOUNTER — Ambulatory Visit (INDEPENDENT_AMBULATORY_CARE_PROVIDER_SITE_OTHER): Payer: Medicare HMO | Admitting: Podiatry

## 2023-11-06 ENCOUNTER — Encounter: Payer: Self-pay | Admitting: Podiatry

## 2023-11-06 DIAGNOSIS — M86172 Other acute osteomyelitis, left ankle and foot: Secondary | ICD-10-CM

## 2023-11-06 NOTE — Progress Notes (Signed)
No chief complaint on file.   Subjective:  Patient presents today status post partial first ray amputation of the left foot.  Performed inpatient 09/21/2023.  Patient continues to do well.  He says he has basically full activity with no restrictions.  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   Thrombocytopenia (HCC) 08/10/2018   Staying in the low 100s will follow closely    Past Surgical History:  Procedure Laterality Date   ABDOMINAL AORTOGRAM W/LOWER EXTREMITY Bilateral 08/20/2019   Procedure: ABDOMINAL AORTOGRAM W/LOWER EXTREMITY;  Surgeon: Cephus Shelling, MD;  Location: [facility] INVASIVE CV LAB;  Service: Cardiovascular;  Laterality: Bilateral;   ABDOMINAL AORTOGRAM W/LOWER EXTREMITY N/A 08/26/2023   Procedure: ABDOMINAL AORTOGRAM W/LOWER EXTREMITY;  Surgeon: Maeola Harman, MD;  Location: [facility] INVASIVE CV LAB;  Service: Cardiovascular;  Laterality: N/A;   ACHILLES TENDON SURGERY Right 08/26/2020   Procedure: ACHILLES TENDON LENGTHENING;  Surgeon: Edwin Cap, DPM;  Location: [facility] OR;  Service: Podiatry;  Laterality: Right;   AMPUTATION Right 09/15/2019   Procedure: AMPUTATION RIGHT TOES One, Two, And Three;  Surgeon: Maeola Harman, MD;  Location: [facility] OR;  Service: Vascular;  Laterality: Right;   AMPUTATION Left 09/21/2023   Procedure: AMPUTATION DIGIT;  Surgeon: Felecia Shelling, DPM;  Location: [facility]S;  Service: Orthopedics/Podiatry;  Laterality: Left;   APPENDECTOMY     CATARACT EXTRACTION W/PHACO  Right 05/02/2018   Procedure: CATARACT EXTRACTION PHACO AND INTRAOCULAR LENS PLACEMENT (IOC);  Surgeon: Fabio Pierce, MD;  Location: [facility];  Service: Ophthalmology;  Laterality: Right;  CDE: 11.11   CATARACT EXTRACTION W/PHACO Left 07/04/2018   Procedure: CATARACT EXTRACTION PHACO AND INTRAOCULAR LENS PLACEMENT (IOC);  Surgeon: Fabio Pierce, MD;  Location: [facility];  Service: Ophthalmology;  Laterality: Left;  CDE: 7.15   CHOLECYSTECTOMY     CYSTOSCOPY W/ URETERAL STENT PLACEMENT Right 11/22/2021   Procedure: CYSTOSCOPY WITH RETROGRADE PYELOGRAM/URETERAL STENT PLACEMENT;  Surgeon: Marcine Matar, MD;  Location: [facility];  Service: Urology;  Laterality: Right;   CYSTOSCOPY WITH RETROGRADE PYELOGRAM, URETEROSCOPY AND STENT PLACEMENT Right 02/01/2022   Procedure: CYSTOSCOPY WITH RETROGRADE PYELOGRAM, URETEROSCOPY AND STENT PLACEMENT;  Surgeon: Malen Gauze, MD;  Location: [facility];  Service: Urology;  Laterality: Right;   ENDARTERECTOMY FEMORAL Left 08/27/2023   Procedure: RIGHT FEMORAL ENDARTERECTOMY;  Surgeon: Maeola Harman, MD;  Location: [facility] OR;  Service: Vascular;  Laterality: Left;   FEMORAL-POPLITEAL BYPASS GRAFT Right 09/15/2019   Procedure: Right BYPASS GRAFT FEMORAL to Above Knee POPLITEAL ARTERY;  Surgeon: Maeola Harman, MD;  Location: [facility] OR;  Service: Vascular;  Laterality: Right;   FEMORAL-POPLITEAL BYPASS GRAFT Right 01/26/2020   Procedure: IRRIGATION AND DEBRIDEMENT RIGHT FEMORAL POPLITEAL BYPASS SITE;  Surgeon: Maeola Harman, MD;  Location: [facility] OR;  Service: Vascular;  Laterality: Right;   FEMORAL-POPLITEAL BYPASS GRAFT Left 08/27/2023   Procedure: BYPASS GRAFT LEFT COMMON FEMORAL-POPLITEAL ARTERY;  Surgeon: Maeola Harman, MD;  Location: [facility] OR;  Service: Vascular;  Laterality: Left;   HOLMIUM LASER APPLICATION Right 02/01/2022   Procedure: HOLMIUM LASER APPLICATION;  Surgeon: Malen Gauze, MD;  Location: [facility];  Service: Urology;   Laterality: Right;   MULTIPLE TOOTH EXTRACTIONS     ORCHIECTOMY     PERIPHERAL VASCULAR INTERVENTION Left 08/20/2019   Procedure: PERIPHERAL VASCULAR INTERVENTION;  Surgeon: Cephus Shelling, MD;  Location: [facility] INVASIVE CV LAB;  Service: Cardiovascular;  Laterality: Left;  common/external iliac   PERIPHERAL VASCULAR INTERVENTION Right 08/26/2023   Procedure: PERIPHERAL VASCULAR INTERVENTION;  Surgeon: Maeola Harman, MD;  Location: [facility] INVASIVE CV LAB;  Service: Cardiovascular;  Laterality: Right;  External Illiac   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   Procedure: TRANSMETATARSAL AMPUTATION;  Surgeon: Maeola Harman, MD;  Location: [facility] OR;  Service: Vascular;  Laterality: Right;   TRANSMETATARSAL AMPUTATION Right 08/26/2020   Procedure: TRANSMETATARSAL AMPUTATION REVISION;  Surgeon: Edwin Cap, DPM;  Location: [facility] OR;  Service: Podiatry;  Laterality: Right;   VASECTOMY     WOUND DEBRIDEMENT Right 08/26/2020   Procedure: EXCISION WOUND;  Surgeon: Edwin Cap, DPM;  Location: [facility] OR;  Service: Podiatry;  Laterality: Right;    No Known Allergies  Objective/Physical Exam Neurovascular status intact.  Incision is completely healed.  There is no edema or erythema or any concern for underlying infection.  Assessment: 1. s/p partial first ray amputation left. DOS: 09/21/2023   Plan of Care:  -Patient was evaluated.  - The incision is completely healed.  There is no swelling or concern for any residual infection -Will sign off from a podiatry/surgical standpoint.  Continue good supportive diabetic shoes and insoles -Return to clinic as needed   Felecia Shelling, DPM [facility]  Dr. Felecia Shelling, DPM    [address] [place], [place] [address]                Office [phone]  Fax [phone]

## 2023-11-07 DIAGNOSIS — M6281 Muscle weakness (generalized): Secondary | ICD-10-CM | POA: Diagnosis not present

## 2023-11-11 DIAGNOSIS — M6281 Muscle weakness (generalized): Secondary | ICD-10-CM | POA: Diagnosis not present

## 2023-11-19 DIAGNOSIS — M6281 Muscle weakness (generalized): Secondary | ICD-10-CM | POA: Diagnosis not present

## 2023-11-21 ENCOUNTER — Telehealth: Payer: Self-pay

## 2023-11-21 DIAGNOSIS — M6281 Muscle weakness (generalized): Secondary | ICD-10-CM | POA: Diagnosis not present

## 2023-11-21 NOTE — Telephone Encounter (Signed)
Care Plan dropped off and placed in Red Folder placed in Dr Lorin Picket bin to be signed

## 2023-11-23 NOTE — Telephone Encounter (Signed)
Form was completed thank you 

## 2023-12-26 ENCOUNTER — Other Ambulatory Visit: Payer: Self-pay | Admitting: Family Medicine

## 2024-01-01 ENCOUNTER — Ambulatory Visit: Payer: 59 | Admitting: Family Medicine

## 2024-01-01 VITALS — BP 150/84 | HR 82 | Temp 98.4°F | Ht 71.0 in

## 2024-01-01 DIAGNOSIS — J449 Chronic obstructive pulmonary disease, unspecified: Secondary | ICD-10-CM

## 2024-01-01 DIAGNOSIS — Z89431 Acquired absence of right foot: Secondary | ICD-10-CM | POA: Diagnosis not present

## 2024-01-01 DIAGNOSIS — R739 Hyperglycemia, unspecified: Secondary | ICD-10-CM | POA: Diagnosis not present

## 2024-01-01 DIAGNOSIS — I70245 Atherosclerosis of native arteries of left leg with ulceration of other part of foot: Secondary | ICD-10-CM

## 2024-01-01 NOTE — Progress Notes (Signed)
 Subjective:    Patient ID: Raymond Knapp., male    DOB: 03-03-1951, 73 y.o.   MRN: [id number]  Discussed the use of AI scribe software for clinical note transcription with the patient, who gave verbal consent to proceed.  History of Present Illness   The patient, with a history of foot surgery, presents for a routine check-up. He reports feeling well overall, with no complaints of dyspnea or other health issues. He maintains an active lifestyle, able to walk as much as he desires, and uses a chair for convenience rather than necessity.  The patient's foot, post-surgery, has healed well with no complications. He has been managing his respiratory health with an inhaler, which he uses as needed rather than on a daily basis. He also reports consistent use of his cholesterol medication and Protonix  for acid reflux.  The patient has a history of smoking, with a current rate of one pack lasting three days. Despite this, his lung sounds were reported as normal during the consultation. The patient also has a history of hypertension, managed with daily aspirin  therapy.  The patient reported a recent incident of toe stubbing, which resulted in the loss of a toenail.  The patient's blood pressure was checked during the consultation and was reported as 122/70.         Review of Systems     Objective:    Physical Exam   VITALS: BP- 122/70 EXTREMITIES: Post-surgical foot shows good healing. Loss of toenail due to trauma. Advised on daily lotion application to prevent skin cracking.     General-in no acute distress Eyes-no discharge Lungs-respiratory rate normal, CTA CV-no murmurs,RRR Extremities skin warm dry no edema Neuro grossly normal Behavior normal, alert No sign of any infection within amputation on the left foot      Assessment & Plan:  Assessment and Plan    Foot Surgery Healed well with no signs of infection. Noted dry skin and patient educated on the importance of daily  moisturizing to prevent skin cracking and potential infection. -Apply lotion daily to foot. -Inspect foot daily for signs of infection.  Respiratory Status Patient reports no breathing difficulties and is able to walk as desired. Inhaler use is inconsistent and only used when needed. -Continue current inhaler use as needed.  Hyperlipidemia Patient reports consistent use of cholesterol medication. -Continue current cholesterol medication.  Gastroesophageal Reflux Disease Patient reports consistent use of Protonix . -Continue Protonix  as currently prescribed.  Tobacco Use Patient reports smoking approximately 1 pack every 3 days. -Encourage continued efforts to reduce and eventually quit smoking.  General Health Maintenance -Continue daily 81mg  aspirin . -Order lab work at [facility] several days before next visit. -Schedule follow-up appointment for early June.     1. 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  2. Status post transmetatarsal amputation of foot, right (HCC) This area looks stable I do not see any sign of infection I can encourage him to check his feet daily and we did school him regarding the signs of infection and the need to come to us  immediately if so-the caretaker from [facility] was present for this as well  3. Chronic obstructive pulmonary disease, unspecified COPD type (HCC) Patient uses his inhaler he still smokes he knows he needs to quit  4. Hyperglycemia  Portion control minimize starches  Lab work before next visit follow-up in approximately 4 to 5 months

## 2024-01-03 ENCOUNTER — Other Ambulatory Visit: Payer: Self-pay

## 2024-01-03 DIAGNOSIS — Z79899 Other long term (current) drug therapy: Secondary | ICD-10-CM

## 2024-01-03 DIAGNOSIS — R7989 Other specified abnormal findings of blood chemistry: Secondary | ICD-10-CM

## 2024-01-03 DIAGNOSIS — E785 Hyperlipidemia, unspecified: Secondary | ICD-10-CM

## 2024-02-07 ENCOUNTER — Other Ambulatory Visit: Payer: Self-pay | Admitting: Nurse Practitioner

## 2024-02-27 ENCOUNTER — Emergency Department (HOSPITAL_COMMUNITY)

## 2024-02-27 ENCOUNTER — Encounter (INDEPENDENT_AMBULATORY_CARE_PROVIDER_SITE_OTHER): Payer: Self-pay

## 2024-02-27 ENCOUNTER — Encounter (HOSPITAL_COMMUNITY): Payer: Self-pay

## 2024-02-27 ENCOUNTER — Inpatient Hospital Stay (HOSPITAL_COMMUNITY)
Admission: EM | Admit: 2024-02-27 | Discharge: 2024-03-17 | DRG: 871 | Disposition: E | Source: Skilled Nursing Facility | Attending: Pulmonary Disease | Admitting: Pulmonary Disease

## 2024-02-27 ENCOUNTER — Other Ambulatory Visit: Payer: Self-pay

## 2024-02-27 DIAGNOSIS — I4892 Unspecified atrial flutter: Secondary | ICD-10-CM | POA: Diagnosis not present

## 2024-02-27 DIAGNOSIS — J440 Chronic obstructive pulmonary disease with acute lower respiratory infection: Secondary | ICD-10-CM | POA: Diagnosis present

## 2024-02-27 DIAGNOSIS — R000 Tachycardia, unspecified: Secondary | ICD-10-CM | POA: Diagnosis not present

## 2024-02-27 DIAGNOSIS — A4189 Other specified sepsis: Secondary | ICD-10-CM | POA: Diagnosis present

## 2024-02-27 DIAGNOSIS — E43 Unspecified severe protein-calorie malnutrition: Secondary | ICD-10-CM | POA: Diagnosis present

## 2024-02-27 DIAGNOSIS — R6521 Severe sepsis with septic shock: Secondary | ICD-10-CM | POA: Diagnosis present

## 2024-02-27 DIAGNOSIS — Z7902 Long term (current) use of antithrombotics/antiplatelets: Secondary | ICD-10-CM

## 2024-02-27 DIAGNOSIS — J9690 Respiratory failure, unspecified, unspecified whether with hypoxia or hypercapnia: Secondary | ICD-10-CM | POA: Diagnosis present

## 2024-02-27 DIAGNOSIS — Z89431 Acquired absence of right foot: Secondary | ICD-10-CM | POA: Diagnosis not present

## 2024-02-27 DIAGNOSIS — N179 Acute kidney failure, unspecified: Secondary | ICD-10-CM | POA: Diagnosis present

## 2024-02-27 DIAGNOSIS — Z9049 Acquired absence of other specified parts of digestive tract: Secondary | ICD-10-CM

## 2024-02-27 DIAGNOSIS — Z515 Encounter for palliative care: Secondary | ICD-10-CM

## 2024-02-27 DIAGNOSIS — I959 Hypotension, unspecified: Secondary | ICD-10-CM

## 2024-02-27 DIAGNOSIS — Z7951 Long term (current) use of inhaled steroids: Secondary | ICD-10-CM

## 2024-02-27 DIAGNOSIS — Z681 Body mass index (BMI) 19 or less, adult: Secondary | ICD-10-CM

## 2024-02-27 DIAGNOSIS — F17210 Nicotine dependence, cigarettes, uncomplicated: Secondary | ICD-10-CM | POA: Diagnosis present

## 2024-02-27 DIAGNOSIS — R001 Bradycardia, unspecified: Secondary | ICD-10-CM | POA: Diagnosis not present

## 2024-02-27 DIAGNOSIS — R6250 Unspecified lack of expected normal physiological development in childhood: Secondary | ICD-10-CM | POA: Diagnosis present

## 2024-02-27 DIAGNOSIS — I462 Cardiac arrest due to underlying cardiac condition: Secondary | ICD-10-CM | POA: Diagnosis not present

## 2024-02-27 DIAGNOSIS — I10 Essential (primary) hypertension: Secondary | ICD-10-CM | POA: Diagnosis not present

## 2024-02-27 DIAGNOSIS — J9600 Acute respiratory failure, unspecified whether with hypoxia or hypercapnia: Principal | ICD-10-CM

## 2024-02-27 DIAGNOSIS — J1001 Influenza due to other identified influenza virus with the same other identified influenza virus pneumonia: Secondary | ICD-10-CM | POA: Diagnosis present

## 2024-02-27 DIAGNOSIS — Z7982 Long term (current) use of aspirin: Secondary | ICD-10-CM

## 2024-02-27 DIAGNOSIS — K219 Gastro-esophageal reflux disease without esophagitis: Secondary | ICD-10-CM | POA: Diagnosis present

## 2024-02-27 DIAGNOSIS — R579 Shock, unspecified: Secondary | ICD-10-CM | POA: Diagnosis not present

## 2024-02-27 DIAGNOSIS — I739 Peripheral vascular disease, unspecified: Secondary | ICD-10-CM | POA: Diagnosis present

## 2024-02-27 DIAGNOSIS — J441 Chronic obstructive pulmonary disease with (acute) exacerbation: Secondary | ICD-10-CM | POA: Diagnosis present

## 2024-02-27 DIAGNOSIS — R9431 Abnormal electrocardiogram [ECG] [EKG]: Secondary | ICD-10-CM | POA: Diagnosis not present

## 2024-02-27 DIAGNOSIS — Z4682 Encounter for fitting and adjustment of non-vascular catheter: Secondary | ICD-10-CM | POA: Diagnosis not present

## 2024-02-27 DIAGNOSIS — J9601 Acute respiratory failure with hypoxia: Secondary | ICD-10-CM | POA: Diagnosis present

## 2024-02-27 DIAGNOSIS — J9602 Acute respiratory failure with hypercapnia: Secondary | ICD-10-CM | POA: Diagnosis not present

## 2024-02-27 DIAGNOSIS — I700 Atherosclerosis of aorta: Secondary | ICD-10-CM | POA: Diagnosis not present

## 2024-02-27 DIAGNOSIS — J80 Acute respiratory distress syndrome: Secondary | ICD-10-CM | POA: Diagnosis not present

## 2024-02-27 DIAGNOSIS — J101 Influenza due to other identified influenza virus with other respiratory manifestations: Secondary | ICD-10-CM | POA: Diagnosis present

## 2024-02-27 DIAGNOSIS — Z66 Do not resuscitate: Secondary | ICD-10-CM | POA: Diagnosis present

## 2024-02-27 DIAGNOSIS — Z8249 Family history of ischemic heart disease and other diseases of the circulatory system: Secondary | ICD-10-CM

## 2024-02-27 DIAGNOSIS — J439 Emphysema, unspecified: Secondary | ICD-10-CM | POA: Diagnosis not present

## 2024-02-27 DIAGNOSIS — R0689 Other abnormalities of breathing: Secondary | ICD-10-CM | POA: Diagnosis not present

## 2024-02-27 DIAGNOSIS — R918 Other nonspecific abnormal finding of lung field: Secondary | ICD-10-CM | POA: Diagnosis not present

## 2024-02-27 DIAGNOSIS — R451 Restlessness and agitation: Secondary | ICD-10-CM | POA: Diagnosis not present

## 2024-02-27 DIAGNOSIS — J09X2 Influenza due to identified novel influenza A virus with other respiratory manifestations: Secondary | ICD-10-CM | POA: Diagnosis not present

## 2024-02-27 DIAGNOSIS — Z87442 Personal history of urinary calculi: Secondary | ICD-10-CM

## 2024-02-27 DIAGNOSIS — Z79899 Other long term (current) drug therapy: Secondary | ICD-10-CM

## 2024-02-27 DIAGNOSIS — R0602 Shortness of breath: Secondary | ICD-10-CM | POA: Diagnosis present

## 2024-02-27 DIAGNOSIS — J449 Chronic obstructive pulmonary disease, unspecified: Secondary | ICD-10-CM | POA: Diagnosis not present

## 2024-02-27 DIAGNOSIS — I4891 Unspecified atrial fibrillation: Secondary | ICD-10-CM | POA: Diagnosis present

## 2024-02-27 HISTORY — DX: Peripheral vascular disease, unspecified: I73.9

## 2024-02-27 LAB — BLOOD GAS, ARTERIAL
Acid-base deficit: 5.9 mmol/L — ABNORMAL HIGH (ref 0.0–2.0)
Bicarbonate: 23.1 mmol/L (ref 20.0–28.0)
Drawn by: 38235
FIO2: 100 %
O2 Saturation: 100 %
Patient temperature: 35.8
pCO2 arterial: 56 mmHg — ABNORMAL HIGH (ref 32–48)
pH, Arterial: 7.22 — ABNORMAL LOW (ref 7.35–7.45)
pO2, Arterial: 287 mmHg — ABNORMAL HIGH (ref 83–108)

## 2024-02-27 LAB — TROPONIN I (HIGH SENSITIVITY): Troponin I (High Sensitivity): 59 ng/L — ABNORMAL HIGH (ref <18)

## 2024-02-27 LAB — BRAIN NATRIURETIC PEPTIDE: B Natriuretic Peptide: 169 pg/mL — ABNORMAL HIGH (ref 0.0–100.0)

## 2024-02-27 LAB — BLOOD GAS, VENOUS
Acid-base deficit: 0.2 mmol/L (ref 0.0–2.0)
Bicarbonate: 33.4 mmol/L — ABNORMAL HIGH (ref 20.0–28.0)
Drawn by: 5844
O2 Saturation: 35.5 %
Patient temperature: 35.9
pCO2, Ven: 105 mmHg (ref 44–60)
pH, Ven: 7.1 — CL (ref 7.25–7.43)
pO2, Ven: <31 mmHg — CL (ref 32–45)

## 2024-02-27 LAB — CBC
HCT: 46.6 % (ref 39.0–52.0)
Hemoglobin: 14.4 g/dL (ref 13.0–17.0)
MCH: 26.9 pg (ref 26.0–34.0)
MCHC: 30.9 g/dL (ref 30.0–36.0)
MCV: 86.9 fL (ref 80.0–100.0)
Platelets: 160 10*3/uL (ref 150–400)
RBC: 5.36 MIL/uL (ref 4.22–5.81)
RDW: 17.9 % — ABNORMAL HIGH (ref 11.5–15.5)
WBC: 10.7 10*3/uL — ABNORMAL HIGH (ref 4.0–10.5)
nRBC: 0 % (ref 0.0–0.2)

## 2024-02-27 LAB — BASIC METABOLIC PANEL
Anion gap: 9 (ref 5–15)
BUN: 23 mg/dL (ref 8–23)
CO2: 29 mmol/L (ref 22–32)
Calcium: 8.6 mg/dL — ABNORMAL LOW (ref 8.9–10.3)
Chloride: 103 mmol/L (ref 98–111)
Creatinine, Ser: 1.13 mg/dL (ref 0.61–1.24)
GFR, Estimated: >60 mL/min (ref >60)
Glucose, Bld: 203 mg/dL — ABNORMAL HIGH (ref 70–99)
Potassium: 4.6 mmol/L (ref 3.5–5.1)
Sodium: 141 mmol/L (ref 135–145)

## 2024-02-27 LAB — RESP PANEL BY RT-PCR (RSV, FLU A&B, COVID)  RVPGX2
Influenza A by PCR: POSITIVE — AB
Influenza B by PCR: NEGATIVE
Resp Syncytial Virus by PCR: NEGATIVE
SARS Coronavirus 2 by RT PCR: NEGATIVE

## 2024-02-27 MED ORDER — MIDAZOLAM-SODIUM CHLORIDE 100-0.9 MG/100ML-% IV SOLN
0.5000 mg/h | INTRAVENOUS | Status: DC
  Administered 2024-02-27: 0.5 mg/h via INTRAVENOUS
  Administered 2024-02-28: 1 mg/h via INTRAVENOUS
  Filled 2024-02-27: qty 100

## 2024-02-27 MED ORDER — MIDAZOLAM HCL 2 MG/2ML IJ SOLN
INTRAMUSCULAR | Status: AC
  Filled 2024-02-27: qty 2

## 2024-02-27 MED ORDER — IPRATROPIUM-ALBUTEROL 0.5-2.5 (3) MG/3ML IN SOLN
3.0000 mL | Freq: Once | RESPIRATORY_TRACT | Status: AC
  Administered 2024-02-27: 3 mL via RESPIRATORY_TRACT
  Filled 2024-02-27: qty 3

## 2024-02-27 MED ORDER — ETOMIDATE 2 MG/ML IV SOLN
20.0000 mg | Freq: Once | INTRAVENOUS | Status: AC
  Administered 2024-02-27: 20 mg via INTRAVENOUS
  Filled 2024-02-27 (×2): qty 10

## 2024-02-27 MED ORDER — NOREPINEPHRINE 4 MG/250ML-% IV SOLN
0.0000 ug/min | INTRAVENOUS | Status: DC
  Administered 2024-02-27: 2 ug/min via INTRAVENOUS
  Filled 2024-02-27 (×2): qty 250

## 2024-02-27 MED ORDER — IPRATROPIUM-ALBUTEROL 0.5-2.5 (3) MG/3ML IN SOLN
3.0000 mL | Freq: Once | RESPIRATORY_TRACT | Status: AC
  Administered 2024-02-27: 3 mL via RESPIRATORY_TRACT
  Filled 2024-02-27: qty 6

## 2024-02-27 MED ORDER — IPRATROPIUM-ALBUTEROL 0.5-2.5 (3) MG/3ML IN SOLN
3.0000 mL | Freq: Once | RESPIRATORY_TRACT | Status: AC
  Administered 2024-02-27: 3 mL via RESPIRATORY_TRACT

## 2024-02-27 MED ORDER — MIDAZOLAM HCL 2 MG/2ML IJ SOLN
2.0000 mg | Freq: Once | INTRAMUSCULAR | Status: AC
  Administered 2024-02-27: 2 mg via INTRAVENOUS

## 2024-02-27 MED ORDER — SODIUM CHLORIDE 0.9 % IV BOLUS
2000.0000 mL | Freq: Once | INTRAVENOUS | Status: AC
  Administered 2024-02-27: 2000 mL via INTRAVENOUS

## 2024-02-27 MED ORDER — SUCCINYLCHOLINE CHLORIDE 200 MG/10ML IV SOSY
100.0000 mg | PREFILLED_SYRINGE | Freq: Once | INTRAVENOUS | Status: AC
  Administered 2024-02-27: 100 mg via INTRAVENOUS
  Filled 2024-02-27: qty 10

## 2024-02-27 MED ORDER — KETAMINE HCL 10 MG/ML IJ SOLN
0.3000 mg/kg | Freq: Once | INTRAMUSCULAR | Status: DC
Start: 2024-02-27 — End: 2024-02-27

## 2024-02-27 MED ORDER — METHYLPREDNISOLONE SODIUM SUCC 125 MG IJ SOLR
125.0000 mg | Freq: Once | INTRAMUSCULAR | Status: AC
  Administered 2024-02-27: 125 mg via INTRAVENOUS
  Filled 2024-02-27: qty 2

## 2024-02-27 MED ORDER — FENTANYL 2500MCG IN NS 250ML (10MCG/ML) PREMIX INFUSION
0.0000 ug/h | INTRAVENOUS | Status: DC
  Administered 2024-02-27: 25 ug/h via INTRAVENOUS
  Filled 2024-02-27: qty 250

## 2024-02-27 MED ORDER — MAGNESIUM SULFATE 2 GM/50ML IV SOLN
2.0000 g | Freq: Once | INTRAVENOUS | Status: AC
  Administered 2024-02-27: 2 g via INTRAVENOUS
  Filled 2024-02-27: qty 50

## 2024-02-27 MED ORDER — NITROGLYCERIN 2 % TD OINT
1.0000 [in_us] | TOPICAL_OINTMENT | Freq: Once | TRANSDERMAL | Status: AC
  Administered 2024-02-27: 1 [in_us] via TOPICAL
  Filled 2024-02-27: qty 1

## 2024-02-27 MED ORDER — PROPOFOL 1000 MG/100ML IV EMUL
5.0000 ug/kg/min | INTRAVENOUS | Status: DC
  Administered 2024-02-27: 5 ug/kg/min via INTRAVENOUS
  Filled 2024-02-27: qty 100

## 2024-02-27 MED ORDER — MIDAZOLAM HCL 2 MG/2ML IJ SOLN
2.0000 mg | Freq: Once | INTRAMUSCULAR | Status: AC
  Administered 2024-02-27: 2 mg via INTRAVENOUS
  Filled 2024-02-27: qty 2

## 2024-02-27 NOTE — ED Triage Notes (Addendum)
 Pt from [place] c/o SOB x1 week. Pt with wheezing and and 80% on RA. Pt given neb treatment in route and is on NRB. EDP at bedside. Not able to obtain sat at this this time.   Pt has strong work of breathing and is pulling NRB. Pt redirectable but not for long. Bipap to be started.

## 2024-02-27 NOTE — ED Notes (Signed)
 This RN noted that the pts BP decreased to 54/44 with a Map 50. Pt nitroglycerin patch was removed and 2000 NS bolus was started on pressure bag. MD Tee aware and verbalized these orders.

## 2024-02-27 NOTE — ED Notes (Signed)
 This RN noted that the pts BP was not decreasing after being on nitroglycerin patch for , pt was diaphratic, molted in color, RR 33 not decreasing after x3 Duo Neb Tx, pt was not tolerating the BiPap machine, on NRB 15 L pt was 89%. MD Rhae Hammock was made aware and endorsed it was time to intubate the pt.

## 2024-02-27 NOTE — Progress Notes (Signed)
 Attempted to place patient on BIPAP due to respiratory distress and MD verbal order. Patient ripped mask off several times. During breathing treatment patient also was pulling mask off. MD in room at this time.

## 2024-02-27 NOTE — ED Notes (Signed)
 EDP at bedside

## 2024-02-27 NOTE — ED Provider Notes (Signed)
  EMERGENCY DEPARTMENT AT [facility] Provider Note   CSN: [id number] Arrival date & time: 02/27/24  1949     History  Chief Complaint  Patient presents with   Shortness of Breath    Raymond Walker. is a 73 y.o. male.  73 year old male with past medical history of COPD as well as developmental delay presenting to the emergency department today with shortness of breath.  This apparently going on now for the past week or so.  Acutely worse today.  The patient is a DNR.  Medics tried to place the patient on a nonrebreather on the way here but he was taking this off.   Shortness of Breath      Home Medications Prior to Admission medications   Medication Sig Start Date End Date Taking? Authorizing Provider  acetaminophen (TYLENOL) 325 MG tablet Take 2 tablets (650 mg total) by mouth every 4 (four) hours as needed for mild pain or moderate pain (or Fever >/= 101). 09/26/23   Lewie Chamber, MD  albuterol (VENTOLIN HFA) 108 (90 Base) MCG/ACT inhaler INHALE 2 PUFFS INTO THE LUNGS EVERY 4 HOURS AS NEEDED FOR WHEEZING OR SHORTNESS OF BREATH 08/09/21   Babs Sciara, MD  aspirin EC 81 MG tablet Take 1 tablet (81 mg total) by mouth daily. Swallow whole. 08/31/23   Osvaldo Shipper, MD  clopidogrel (PLAVIX) 75 MG tablet Take 1 tablet (75 mg total) by mouth daily. Patient not taking: Reported on 01/01/2024 09/22/23   Alwyn Ren, MD  cyanocobalamin 1000 MCG tablet Take 1 tablet (1,000 mcg total) by mouth daily. 08/31/23   Osvaldo Shipper, MD  FEROSUL 325 (65 Fe) MG tablet TAKE (1) TABLET BY MOUTH ON MONDAY, WEDNESDAY AND FRIDAY. 12/27/23   Babs Sciara, MD  pantoprazole (PROTONIX) 40 MG tablet TAKE (1) TABLET BY MOUTH ONCE DAILY. 09/22/23   Alwyn Ren, MD  rosuvastatin (CRESTOR) 20 MG tablet Take 1 tablet (20 mg total) by mouth daily. 09/22/23   Alwyn Ren, MD  SYMBICORT 160-4.5 MCG/ACT inhaler INHALE (2) PUFFS INTO THE LUNGS TWICE DAILY FOR COPD.  02/07/24   Campbell Riches, NP      Allergies    Patient has no known allergies.    Review of Systems   Review of Systems  Reason unable to perform ROS: Acute presentation/baseline mental status.  Respiratory:  Positive for shortness of breath.     Physical Exam Updated Vital Signs BP 96/78   Pulse 92   Temp (!) 96.5 F (35.8 C)   Resp (!) 22   Ht 5\' 11"  (1.803 m)   Wt 63 kg   SpO2 97%   BMI 19.37 kg/m  Physical Exam Vitals and nursing note reviewed.   Gen: In respiratory distress with significant accessory muscle usage while breathing Eyes: PERRL, EOMI HEENT: no oropharyngeal swelling Neck: trachea midline Resp: Diminished throughout with faint wheezes with forced expiration, increased work of breathing with severe conversational dyspnea noted Card: RRR, no murmurs, rubs, or gallops Abd: nontender, nondistended Extremities: no calf tenderness, no edema Vascular: 2+ radial pulses bilaterally, 2+ DP pulses bilaterally Skin: Skin cool to the touch, diaphoretic Psyc: acting appropriately   ED Results / Procedures / Treatments   Labs (all labs ordered are listed, but only abnormal results are displayed) Labs Reviewed  RESP PANEL BY RT-PCR (RSV, FLU A&B, COVID)  RVPGX2 - Abnormal; Notable for the following components:      Result Value   Influenza  A by PCR POSITIVE (*)    All other components within normal limits  BASIC METABOLIC PANEL - Abnormal; Notable for the following components:   Glucose, Bld 203 (*)    Calcium 8.6 (*)    All other components within normal limits  BRAIN NATRIURETIC PEPTIDE - Abnormal; Notable for the following components:   B Natriuretic Peptide 169.0 (*)    All other components within normal limits  BLOOD GAS, VENOUS - Abnormal; Notable for the following components:   pH, Ven 7.1 (*)    pCO2, Ven 105 (*)    pO2, Ven <31 (*)    Bicarbonate 33.4 (*)    All other components within normal limits  CBC - Abnormal; Notable for the following  components:   WBC 10.7 (*)    RDW 17.9 (*)    All other components within normal limits  TROPONIN I (HIGH SENSITIVITY) - Abnormal; Notable for the following components:   Troponin I (High Sensitivity) 59 (*)    All other components within normal limits  BLOOD GAS, ARTERIAL  TROPONIN I (HIGH SENSITIVITY)    EKG None  Radiology DG Chest Port 1 View Result Date: 02/27/2024 CLINICAL DATA:  Shortness of breath EXAM: PORTABLE CHEST 1 VIEW COMPARISON:  Chest radiograph 07/03/2023 FINDINGS: Monitoring leads overlie the patient. Aortic atherosclerosis. Emphysematous change. Linear opacity left lung base may represent atelectasis or scarring. No pleural effusion or pneumothorax. Thoracic spine degenerative changes. IMPRESSION: Emphysematous change. No acute process. Electronically Signed   By: Annia Belt M.D.   On: 02/27/2024 22:31    Procedures Procedure Name: Intubation Date/Time: 02/27/2024 11:11 PM  Performed by: Durwin Glaze, MDPre-anesthesia Checklist: Patient identified, Patient being monitored, Emergency Drugs available, Timeout performed and Suction available Oxygen Delivery Method: Non-rebreather mask Preoxygenation: Pre-oxygenation with 100% oxygen Induction Type: Rapid sequence Ventilation: Mask ventilation without difficulty Laryngoscope Size: 4 Grade View: Grade I Tube size: 7.5 mm Number of attempts: 1 Placement Confirmation: ETT inserted through vocal cords under direct vision, CO2 detector and Breath sounds checked- equal and bilateral Secured at: 25 cm Tube secured with: Tape        Medications Ordered in ED Medications  fentaNYL in NS (33mcg/ml) infusion-PREMIX (125 mcg/hr Intravenous Infusion Verify 02/27/24 2245)  propofol (DIPRIVAN) 1000 MG/100ML infusion (5 mcg/kg/min  63 kg Intravenous Infusion Verify 02/27/24 2245)  norepinephrine (LEVOPHED) 4mg  in (0.016 mg/mL) premix infusion (6 mcg/min Intravenous Infusion Verify 02/27/24 2245)   ipratropium-albuterol (DUONEB) 0.5-2.5 (3) MG/3ML nebulizer solution 3 mL (3 mLs Nebulization Given 02/27/24 2023)  ipratropium-albuterol (DUONEB) 0.5-2.5 (3) MG/3ML nebulizer solution 3 mL (3 mLs Nebulization Given 02/27/24 2023)  ipratropium-albuterol (DUONEB) 0.5-2.5 (3) MG/3ML nebulizer solution 3 mL (3 mLs Nebulization Given 02/27/24 2020)  methylPREDNISolone sodium succinate (SOLU-MEDROL) 125 mg/2 mL injection 125 mg (125 mg Intravenous Given 02/27/24 2028)  magnesium sulfate IVPB 2 g 50 mL (0 g Intravenous Stopped 02/27/24 2133)  nitroGLYCERIN (NITROGLYN) 2 % ointment 1 inch (1 inch Topical Given 02/27/24 2026)  etomidate (AMIDATE) injection 20 mg (20 mg Intravenous Given 02/27/24 2107)  succinylcholine (ANECTINE) syringe 100 mg (100 mg Intravenous Given 02/27/24 2108)  midazolam (VERSED) injection 2 mg (2 mg Intravenous Given 02/27/24 2129)  sodium chloride 0.9 % bolus 2,000 mL (0 mLs Intravenous Stopped 02/27/24 2308)  midazolam (VERSED) injection 2 mg (2 mg Intravenous Given 02/27/24 2245)    ED Course/ Medical Decision Making/ A&P  Medical Decision Making 73 year old male with past medical history of COPD and developmental delay presenting to the emergency department today with shortness of breath.  The patient is an extremis on arrival here.  Initially we did try the patient on BiPAP.  He is not tolerating this.  I did call to discuss goals of care with the patient's family there is his healthcare power of attorney and she reports that he is DNR but if he would need intubation that she would be okay with that.  We will try DuoNebs for the patient here.  I did perform a bedside ultrasound and I do not appreciate significant pulmonary edema.  His blood pressures initially were greater than 200 but on recheck they are in the 160s over 107.  We will try some Nitropaste here to see if this helps in the event that he does have some mild pulmonary edema but this seems to  be more consistent with COPD at this time.  The patient's chest x-ray interpreted by me seems to be more consistent with COPD.  Occasionally the patient would have elevated blood pressures but on recheck it would be down in the 160s to 170s.  He had decline in his mental status and was intubated for airway protection.  A call was placed to the intensivist at [facility] for admission.  I did speak with Dr. Sherryll Burger who accepts patient for admission.  The patient was intubated successfully here.  He did have some transient hypotension that seem to be related more to his sedation.  His blood pressures improved with Levophed.  The patient is flu positive.  He will be admitted to the ICU at [facility] for further management.  After speaking with the patient's power of attorney the patient is still a DNR but they did want intubation tonight but moving forward through the hospitalization she reports that the patient is a DNR if he were to worsen.  Amount and/or Complexity of Data Reviewed Labs: ordered. Radiology: ordered.  Risk Prescription drug management. Decision regarding hospitalization.   CRITICAL CARE Performed by: Durwin Glaze   Total critical care time: 75 minutes  Critical care time was exclusive of separately billable procedures and treating other patients.  Critical care was necessary to treat or prevent imminent or life-threatening deterioration.  Critical care was time spent personally by me on the following activities: development of treatment plan with patient and/or surrogate as well as nursing, discussions with consultants, evaluation of patient's response to treatment, examination of patient, obtaining history from patient or surrogate, ordering and performing treatments and interventions, ordering and review of laboratory studies, ordering and review of radiographic studies, pulse oximetry and re-evaluation of patient's condition.         Final Clinical Impression(s) / ED  Diagnoses Final diagnoses:  Acute respiratory failure, unspecified whether with hypoxia or hypercapnia [facility])    Rx / DC Orders ED Discharge Orders     None         Durwin Glaze, MD 02/27/24 2313

## 2024-02-27 NOTE — ED Notes (Signed)
 Date and time results received: 02/27/24 2203 (use smartphrase ".now" to insert current time)  Test: VBG Critical Value: pH 7.1, pCO2 105, pO2 <31  Name of Provider Notified: MD Rhae Hammock  Orders Received? Or Actions Taken?:

## 2024-02-27 NOTE — ED Notes (Signed)
 Update the pts supervisor at [facility] regarding pts condition.

## 2024-02-28 ENCOUNTER — Inpatient Hospital Stay (HOSPITAL_COMMUNITY)

## 2024-02-28 ENCOUNTER — Encounter (HOSPITAL_COMMUNITY): Payer: Self-pay

## 2024-02-28 DIAGNOSIS — J9601 Acute respiratory failure with hypoxia: Secondary | ICD-10-CM | POA: Diagnosis not present

## 2024-02-28 DIAGNOSIS — R579 Shock, unspecified: Secondary | ICD-10-CM | POA: Diagnosis not present

## 2024-02-28 DIAGNOSIS — J09X2 Influenza due to identified novel influenza A virus with other respiratory manifestations: Secondary | ICD-10-CM | POA: Diagnosis not present

## 2024-02-28 DIAGNOSIS — J9602 Acute respiratory failure with hypercapnia: Secondary | ICD-10-CM | POA: Diagnosis not present

## 2024-02-28 DIAGNOSIS — I959 Hypotension, unspecified: Secondary | ICD-10-CM | POA: Diagnosis not present

## 2024-02-28 DIAGNOSIS — R9431 Abnormal electrocardiogram [ECG] [EKG]: Secondary | ICD-10-CM

## 2024-02-28 DIAGNOSIS — J9600 Acute respiratory failure, unspecified whether with hypoxia or hypercapnia: Secondary | ICD-10-CM | POA: Diagnosis not present

## 2024-02-28 LAB — MAGNESIUM: Magnesium: 2.6 mg/dL — ABNORMAL HIGH (ref 1.7–2.4)

## 2024-02-28 LAB — STREP PNEUMONIAE URINARY ANTIGEN: Strep Pneumo Urinary Antigen: NEGATIVE

## 2024-02-28 LAB — BASIC METABOLIC PANEL
Anion gap: 11 (ref 5–15)
BUN: 24 mg/dL — ABNORMAL HIGH (ref 8–23)
CO2: 21 mmol/L — ABNORMAL LOW (ref 22–32)
Calcium: 8.5 mg/dL — ABNORMAL LOW (ref 8.9–10.3)
Chloride: 106 mmol/L (ref 98–111)
Creatinine, Ser: 1.52 mg/dL — ABNORMAL HIGH (ref 0.61–1.24)
GFR, Estimated: 48 mL/min — ABNORMAL LOW (ref >60)
Glucose, Bld: 234 mg/dL — ABNORMAL HIGH (ref 70–99)
Potassium: 4.2 mmol/L (ref 3.5–5.1)
Sodium: 138 mmol/L (ref 135–145)

## 2024-02-28 LAB — GLUCOSE, CAPILLARY
Glucose-Capillary: 177 mg/dL — ABNORMAL HIGH (ref 70–99)
Glucose-Capillary: 225 mg/dL — ABNORMAL HIGH (ref 70–99)
Glucose-Capillary: 239 mg/dL — ABNORMAL HIGH (ref 70–99)
Glucose-Capillary: 234 mg/dL — ABNORMAL HIGH (ref 70–99)
Glucose-Capillary: 133 mg/dL — ABNORMAL HIGH (ref 70–99)
Glucose-Capillary: 148 mg/dL — ABNORMAL HIGH (ref 70–99)
Glucose-Capillary: 147 mg/dL — ABNORMAL HIGH (ref 70–99)

## 2024-02-28 LAB — POCT I-STAT 7, (LYTES, BLD GAS, ICA,H+H)
Acid-base deficit: 5 mmol/L — ABNORMAL HIGH (ref 0.0–2.0)
Acid-base deficit: 6 mmol/L — ABNORMAL HIGH (ref 0.0–2.0)
Acid-base deficit: 9 mmol/L — ABNORMAL HIGH (ref 0.0–2.0)
Acid-base deficit: 6 mmol/L — ABNORMAL HIGH (ref 0.0–2.0)
Acid-base deficit: 5 mmol/L — ABNORMAL HIGH (ref 0.0–2.0)
Bicarbonate: 24.9 mmol/L (ref 20.0–28.0)
Bicarbonate: 23.3 mmol/L (ref 20.0–28.0)
Bicarbonate: 22.5 mmol/L (ref 20.0–28.0)
Bicarbonate: 22.2 mmol/L (ref 20.0–28.0)
Bicarbonate: 22 mmol/L (ref 20.0–28.0)
Calcium, Ion: 1.25 mmol/L (ref 1.15–1.40)
Calcium, Ion: 1.22 mmol/L (ref 1.15–1.40)
Calcium, Ion: 1.17 mmol/L (ref 1.15–1.40)
Calcium, Ion: 1.22 mmol/L (ref 1.15–1.40)
Calcium, Ion: 1.18 mmol/L (ref 1.15–1.40)
HCT: 46 % (ref 39.0–52.0)
HCT: 45 % (ref 39.0–52.0)
HCT: 44 % (ref 39.0–52.0)
HCT: 44 % (ref 39.0–52.0)
HCT: 41 % (ref 39.0–52.0)
Hemoglobin: 15.6 g/dL (ref 13.0–17.0)
Hemoglobin: 15.3 g/dL (ref 13.0–17.0)
Hemoglobin: 15 g/dL (ref 13.0–17.0)
Hemoglobin: 15 g/dL (ref 13.0–17.0)
Hemoglobin: 13.9 g/dL (ref 13.0–17.0)
O2 Saturation: 100 %
O2 Saturation: 95 %
O2 Saturation: 93 %
O2 Saturation: 96 %
O2 Saturation: 99 %
Patient temperature: 97.9
Patient temperature: 98.8
Patient temperature: 37.4
Potassium: 4.3 mmol/L (ref 3.5–5.1)
Potassium: 4.4 mmol/L (ref 3.5–5.1)
Potassium: 3.9 mmol/L (ref 3.5–5.1)
Potassium: 4.9 mmol/L (ref 3.5–5.1)
Potassium: 4.7 mmol/L (ref 3.5–5.1)
Sodium: 140 mmol/L (ref 135–145)
Sodium: 139 mmol/L (ref 135–145)
Sodium: 141 mmol/L (ref 135–145)
Sodium: 142 mmol/L (ref 135–145)
Sodium: 138 mmol/L (ref 135–145)
TCO2: 27 mmol/L (ref 22–32)
TCO2: 25 mmol/L (ref 22–32)
TCO2: 25 mmol/L (ref 22–32)
TCO2: 24 mmol/L (ref 22–32)
TCO2: 23 mmol/L (ref 22–32)
pCO2 arterial: 67.6 mmHg (ref 32–48)
pCO2 arterial: 60.6 mmHg — ABNORMAL HIGH (ref 32–48)
pCO2 arterial: 73.7 mmHg (ref 32–48)
pCO2 arterial: 52 mmHg — ABNORMAL HIGH (ref 32–48)
pCO2 arterial: 49.5 mmHg — ABNORMAL HIGH (ref 32–48)
pH, Arterial: 7.172 — CL (ref 7.35–7.45)
pH, Arterial: 7.193 — CL (ref 7.35–7.45)
pH, Arterial: 7.095 — CL (ref 7.35–7.45)
pH, Arterial: 7.238 — ABNORMAL LOW (ref 7.35–7.45)
pH, Arterial: 7.256 — ABNORMAL LOW (ref 7.35–7.45)
pO2, Arterial: 230 mmHg — ABNORMAL HIGH (ref 83–108)
pO2, Arterial: 96 mmHg (ref 83–108)
pO2, Arterial: 94 mmHg (ref 83–108)
pO2, Arterial: 93 mmHg (ref 83–108)
pO2, Arterial: 171 mmHg — ABNORMAL HIGH (ref 83–108)

## 2024-02-28 LAB — CBC
HCT: 47.6 % (ref 39.0–52.0)
Hemoglobin: 15 g/dL (ref 13.0–17.0)
MCH: 26.6 pg (ref 26.0–34.0)
MCHC: 31.5 g/dL (ref 30.0–36.0)
MCV: 84.4 fL (ref 80.0–100.0)
Platelets: 197 10*3/uL (ref 150–400)
RBC: 5.64 MIL/uL (ref 4.22–5.81)
RDW: 18.1 % — ABNORMAL HIGH (ref 11.5–15.5)
WBC: 17.7 10*3/uL — ABNORMAL HIGH (ref 4.0–10.5)
nRBC: 0 % (ref 0.0–0.2)

## 2024-02-28 LAB — ECHOCARDIOGRAM COMPLETE
AR max vel: 1.78 cm2
AV Area VTI: 1.37 cm2
AV Area mean vel: 1.71 cm2
AV Mean grad: 2.5 mmHg
AV Peak grad: 4.9 mmHg
Ao pk vel: 1.11 m/s
Area-P 1/2: 5.13 cm2
Height: 71 in
S' Lateral: 2.8 cm
Weight: 2363.33 [oz_av]

## 2024-02-28 LAB — PROCALCITONIN: Procalcitonin: 0.15 ng/mL

## 2024-02-28 LAB — TROPONIN I (HIGH SENSITIVITY): Troponin I (High Sensitivity): 79 ng/L — ABNORMAL HIGH (ref <18)

## 2024-02-28 LAB — TYPE AND SCREEN
ABO/RH(D): A POS
Antibody Screen: NEGATIVE

## 2024-02-28 LAB — D-DIMER, QUANTITATIVE: D-Dimer, Quant: 3.35 ug{FEU}/mL — ABNORMAL HIGH (ref 0.00–0.50)

## 2024-02-28 LAB — LACTIC ACID, PLASMA
Lactic Acid, Venous: 1.7 mmol/L (ref 0.5–1.9)
Lactic Acid, Venous: 3.1 mmol/L (ref 0.5–1.9)

## 2024-02-28 LAB — PHOSPHORUS
Phosphorus: 6.3 mg/dL — ABNORMAL HIGH (ref 2.5–4.6)
Phosphorus: 5.9 mg/dL — ABNORMAL HIGH (ref 2.5–4.6)

## 2024-02-28 LAB — MRSA NEXT GEN BY PCR, NASAL: MRSA by PCR Next Gen: NOT DETECTED

## 2024-02-28 MED ORDER — PROSOURCE TF20 ENFIT COMPATIBL EN LIQD
60.0000 mL | Freq: Every day | ENTERAL | Status: DC
  Administered 2024-02-28: 60 mL
  Filled 2024-02-28: qty 60

## 2024-02-28 MED ORDER — POLYETHYLENE GLYCOL 3350 17 G PO PACK
17.0000 g | PACK | Freq: Every day | ORAL | Status: DC
  Administered 2024-02-28: 17 g
  Filled 2024-02-28: qty 1

## 2024-02-28 MED ORDER — ACETAMINOPHEN 325 MG PO TABS
ORAL_TABLET | ORAL | Status: AC
  Filled 2024-02-28: qty 2

## 2024-02-28 MED ORDER — ACETAMINOPHEN 650 MG RE SUPP: 650.0000 mg | Freq: Four times a day (QID) | RECTAL | Status: DC | PRN

## 2024-02-28 MED ORDER — OSELTAMIVIR PHOSPHATE 6 MG/ML PO SUSR
30.0000 mg | Freq: Two times a day (BID) | ORAL | Status: DC
  Administered 2024-02-28 (×2): 30 mg
  Filled 2024-02-28 (×4): qty 12.5

## 2024-02-28 MED ORDER — PROPOFOL 1000 MG/100ML IV EMUL: 0.0000 ug/kg/min | INTRAVENOUS | Status: DC

## 2024-02-28 MED ORDER — CHLORHEXIDINE GLUCONATE CLOTH 2 % EX PADS
6.0000 | MEDICATED_PAD | Freq: Every day | CUTANEOUS | Status: DC
  Administered 2024-02-28: 6 via TOPICAL

## 2024-02-28 MED ORDER — BUDESONIDE 0.25 MG/2ML IN SUSP
0.2500 mg | Freq: Two times a day (BID) | RESPIRATORY_TRACT | Status: DC
  Administered 2024-02-28 (×2): 0.25 mg via RESPIRATORY_TRACT
  Filled 2024-02-28 (×2): qty 2

## 2024-02-28 MED ORDER — SODIUM CHLORIDE 0.9 % IV SOLN
500.0000 mg | INTRAVENOUS | Status: DC
  Administered 2024-02-28 – 2024-02-29 (×2): 500 mg via INTRAVENOUS
  Filled 2024-02-28 (×2): qty 5

## 2024-02-28 MED ORDER — DOCUSATE SODIUM 50 MG/5ML PO LIQD
100.0000 mg | Freq: Two times a day (BID) | ORAL | Status: DC
  Administered 2024-02-28 (×3): 100 mg
  Filled 2024-02-28 (×3): qty 10

## 2024-02-28 MED ORDER — ALBUTEROL SULFATE (2.5 MG/3ML) 0.083% IN NEBU
2.5000 mg | INHALATION_SOLUTION | Freq: Four times a day (QID) | RESPIRATORY_TRACT | Status: DC | PRN
  Administered 2024-02-28: 2.5 mg via RESPIRATORY_TRACT
  Filled 2024-02-28: qty 3

## 2024-02-28 MED ORDER — INSULIN ASPART 100 UNIT/ML IJ SOLN
0.0000 [IU] | INTRAMUSCULAR | Status: DC
  Administered 2024-02-28: 3 [IU] via SUBCUTANEOUS
  Administered 2024-02-28: 7 [IU] via SUBCUTANEOUS
  Administered 2024-02-28 – 2024-02-29 (×3): 3 [IU] via SUBCUTANEOUS

## 2024-02-28 MED ORDER — ASPIRIN 81 MG PO CHEW: 81.0000 mg | CHEWABLE_TABLET | Freq: Every day | ORAL | Status: DC

## 2024-02-28 MED ORDER — ORAL CARE MOUTH RINSE: 15.0000 mL | OROMUCOSAL | Status: DC | PRN

## 2024-02-28 MED ORDER — DOCUSATE SODIUM 100 MG PO CAPS: 100.0000 mg | ORAL_CAPSULE | Freq: Two times a day (BID) | ORAL | Status: DC | PRN

## 2024-02-28 MED ORDER — DOCUSATE SODIUM 50 MG/5ML PO LIQD: 100.0000 mg | Freq: Two times a day (BID) | ORAL | Status: DC | PRN

## 2024-02-28 MED ORDER — INSULIN ASPART 100 UNIT/ML IJ SOLN
0.0000 [IU] | INTRAMUSCULAR | Status: DC
  Administered 2024-02-28 (×2): 3 [IU] via SUBCUTANEOUS

## 2024-02-28 MED ORDER — ORAL CARE MOUTH RINSE
15.0000 mL | OROMUCOSAL | Status: DC
  Administered 2024-02-28 – 2024-02-29 (×16): 15 mL via OROMUCOSAL

## 2024-02-28 MED ORDER — ASPIRIN 81 MG PO TBEC: 81.0000 mg | DELAYED_RELEASE_TABLET | Freq: Every day | ORAL | Status: DC

## 2024-02-28 MED ORDER — ASPIRIN 325 MG PO TABS
325.0000 mg | ORAL_TABLET | Freq: Once | ORAL | Status: AC
Start: 2024-02-28 — End: 2024-02-28
  Administered 2024-02-28: 325 mg via ORAL
  Filled 2024-02-28: qty 1

## 2024-02-28 MED ORDER — HEPARIN SODIUM (PORCINE) 5000 UNIT/ML IJ SOLN
5000.0000 [IU] | Freq: Three times a day (TID) | INTRAMUSCULAR | Status: DC
  Administered 2024-02-28 – 2024-02-29 (×3): 5000 [IU] via SUBCUTANEOUS
  Filled 2024-02-28 (×2): qty 1

## 2024-02-28 MED ORDER — OSELTAMIVIR PHOSPHATE 6 MG/ML PO SUSR
75.0000 mg | Freq: Once | ORAL | Status: AC
  Administered 2024-02-28: 75 mg
  Filled 2024-02-28: qty 12.5

## 2024-02-28 MED ORDER — LACTATED RINGERS IV SOLN: INTRAVENOUS | Status: AC

## 2024-02-28 MED ORDER — LORAZEPAM 2 MG/ML IJ SOLN
0.5000 mg | Freq: Once | INTRAMUSCULAR | Status: AC
  Administered 2024-02-28: 0.5 mg via INTRAVENOUS
  Filled 2024-02-28: qty 1

## 2024-02-28 MED ORDER — OSELTAMIVIR PHOSPHATE 6 MG/ML PO SUSR: 75.0000 mg | Freq: Two times a day (BID) | ORAL | Status: DC

## 2024-02-28 MED ORDER — SODIUM CHLORIDE 0.9 % IV BOLUS
500.0000 mL | Freq: Once | INTRAVENOUS | Status: AC
  Administered 2024-02-28: 500 mL via INTRAVENOUS

## 2024-02-28 MED ORDER — ACETAMINOPHEN 500 MG PO TABS
1000.0000 mg | ORAL_TABLET | Freq: Three times a day (TID) | ORAL | Status: DC | PRN
  Administered 2024-02-28: 1000 mg via ORAL
  Filled 2024-02-28: qty 2

## 2024-02-28 MED ORDER — POLYETHYLENE GLYCOL 3350 17 G PO PACK: 17.0000 g | PACK | Freq: Every day | ORAL | Status: DC | PRN

## 2024-02-28 MED ORDER — ARFORMOTEROL TARTRATE 15 MCG/2ML IN NEBU
15.0000 ug | INHALATION_SOLUTION | Freq: Two times a day (BID) | RESPIRATORY_TRACT | Status: DC
  Administered 2024-02-28 (×2): 15 ug via RESPIRATORY_TRACT
  Filled 2024-02-28 (×2): qty 2

## 2024-02-28 MED ORDER — DEXMEDETOMIDINE HCL IN NACL 400 MCG/100ML IV SOLN
0.0000 ug/kg/h | INTRAVENOUS | Status: DC
  Administered 2024-02-28: 0.4 ug/kg/h via INTRAVENOUS
  Administered 2024-02-28: 0.5 ug/kg/h via INTRAVENOUS
  Filled 2024-02-28 (×2): qty 100

## 2024-02-28 MED ORDER — THIAMINE MONONITRATE 100 MG PO TABS
100.0000 mg | ORAL_TABLET | Freq: Every day | ORAL | Status: DC
  Administered 2024-02-28: 100 mg
  Filled 2024-02-28: qty 1

## 2024-02-28 MED ORDER — REVEFENACIN 175 MCG/3ML IN SOLN: 175.0000 ug | Freq: Every day | RESPIRATORY_TRACT | Status: DC

## 2024-02-28 MED ORDER — OSMOLITE 1.5 CAL PO LIQD
1000.0000 mL | ORAL | Status: DC
  Administered 2024-02-28: 1000 mL
  Filled 2024-02-28 (×2): qty 1000

## 2024-02-28 MED ORDER — NOREPINEPHRINE 4 MG/250ML-% IV SOLN
0.0000 ug/min | INTRAVENOUS | Status: DC
  Administered 2024-02-28: 30 ug/min via INTRAVENOUS
  Administered 2024-02-28: 29 ug/min via INTRAVENOUS
  Administered 2024-02-28: 5 ug/min via INTRAVENOUS
  Administered 2024-02-28: 20 ug/min via INTRAVENOUS
  Administered 2024-02-28: 29 ug/min via INTRAVENOUS
  Filled 2024-02-28 (×5): qty 250

## 2024-02-28 MED ORDER — LACTATED RINGERS IV SOLN: INTRAVENOUS | Status: DC

## 2024-02-28 MED ORDER — PANTOPRAZOLE SODIUM 40 MG IV SOLR
40.0000 mg | Freq: Every day | INTRAVENOUS | Status: DC
  Administered 2024-02-28 (×2): 40 mg via INTRAVENOUS
  Filled 2024-02-28 (×2): qty 10

## 2024-02-28 MED ORDER — HYDROCORTISONE SOD SUC (PF) 100 MG IJ SOLR
100.0000 mg | Freq: Three times a day (TID) | INTRAMUSCULAR | Status: DC
  Administered 2024-02-28 – 2024-02-29 (×3): 100 mg via INTRAVENOUS
  Filled 2024-02-28 (×3): qty 2

## 2024-02-28 MED ORDER — SODIUM CHLORIDE 0.9 % IV SOLN
2.0000 g | INTRAVENOUS | Status: DC
  Administered 2024-02-28 – 2024-02-29 (×2): 2 g via INTRAVENOUS
  Filled 2024-02-28 (×2): qty 20

## 2024-02-28 MED ORDER — VASOPRESSIN 20 UNITS/100 ML INFUSION FOR SHOCK
0.0000 [IU]/min | INTRAVENOUS | Status: DC
  Administered 2024-02-28 – 2024-02-29 (×3): 0.03 [IU]/min via INTRAVENOUS
  Filled 2024-02-28 (×3): qty 100

## 2024-02-28 NOTE — H&P (Incomplete)
 NAME:  Raymond Walker., MRN:  [id number], DOB:  Mar 26, 1951, LOS: 1 ADMISSION DATE:  02/27/2024 CONSULTATION DATE:  Marland Kitchen REFERRING MD:  *** CHIEF COMPLAINT:  ***   History of Present Illness:  ***  Pertinent Medical History:  ***  Significant Hospital Events: Including procedures, antibiotic start and stop dates in addition to other pertinent events     Interim History / Subjective:  ***  Objective:  Blood pressure 96/78, pulse 92, temperature (!) 96.5 F (35.8 C), resp. rate (!) 22, height 5\' 11"  (1.803 m), weight 63 kg, SpO2 97%.    Vent Mode: PRVC FiO2 (%):  [80 %-100 %] 80 % Set Rate:  [22 bmp] 22 bmp Vt Set:  [600 mL] 600 mL PEEP:  [6 cmH20] 6 cmH20 Plateau Pressure:  [22 cmH20] 22 cmH20   Intake/Output Summary (Last 24 hours) at 02/28/2024 0000 Last data filed at 02/27/2024 2245 Gross per 24 hour  Intake 17.48 ml  Output --  Net 17.48 ml   Filed Weights   02/27/24 2115  Weight: 63 kg    Physical Examination: General: {SRACUITY:25313} ill-appearing *** in NAD. HEENT: [place]/AT, anicteric sclera, PERRL, moist mucous membranes. Neuro: {SRLOC:25308} {SRSTIMULI:25309} {SRCOMMANDS:25310} {SRNEUROEXTREMITIES:25312} Strength ***/5 in *** extremities. {SRRBRAINSTEM:25311}  CV: RRR, no m/g/r. PULM: Breathing even and unlabored on ***. Lung fields ***. GI: Soft, nontender, nondistended. Normoactive bowel sounds. Extremities: *** LE edema noted. Skin: Warm/dry, ***.  Resolved Hospital Problem List:  ***  Assessment & Plan:  ***  Best Practice: (right click and "Reselect all SmartList Selections" daily)   Diet/type: {diet type:25684} DVT prophylaxis: {anticoagulation (Optional):25687} GI prophylaxis: {WU:98119} Lines: {Central Venous Access:25771} Foley:  {Central Venous Access:25691} Code Status:  {Code Status:26939} Last date of multidisciplinary goals of care discussion [***]  Labs:  CBC: Recent Labs  Lab 02/27/24 2148  WBC 10.7*  HGB 14.4  HCT 46.6   MCV 86.9  PLT 160    Basic Metabolic Panel: Recent Labs  Lab 02/27/24 2148  NA 141  K 4.6  CL 103  CO2 29  GLUCOSE 203*  BUN 23  CREATININE 1.13  CALCIUM 8.6*   GFR: Estimated Creatinine Clearance: 51.9 mL/min (by C-G formula based on SCr of 1.13 mg/dL). Recent Labs  Lab 02/27/24 2148  WBC 10.7*    Liver Function Tests: No results for input(s): "AST", "ALT", "ALKPHOS", "BILITOT", "PROT", "ALBUMIN" in the last 168 hours. No results for input(s): "LIPASE", "AMYLASE" in the last 168 hours. No results for input(s): "AMMONIA" in the last 168 hours.  ABG:    Component Value Date/Time   PHART 7.22 (L) 02/27/2024 2247   PCO2ART 56 (H) 02/27/2024 2247   PO2ART 287 (H) 02/27/2024 2247   HCO3 23.1 02/27/2024 2247   ACIDBASEDEF 5.9 (H) 02/27/2024 2247   O2SAT 100 02/27/2024 2247     Coagulation Profile: No results for input(s): "INR", "PROTIME" in the last 168 hours.  Cardiac Enzymes: No results for input(s): "CKTOTAL", "CKMB", "CKMBINDEX", "TROPONINI" in the last 168 hours.  HbA1C: Hgb A1c MFr Bld  Date/Time Value Ref Range Status  08/22/2023 08:59 PM 6.1 (H) 4.8 - 5.6 % Final    Comment:    (NOTE) Pre diabetes:          5.7%-6.4%  Diabetes:              >6.4%  Glycemic control for   <7.0% adults with diabetes   06/02/2021 11:01 AM 6.2 (H) 4.8 - 5.6 % Final    Comment:  Prediabetes: 5.7 - 6.4          Diabetes: >6.4          Glycemic control for adults with diabetes: <7.0     CBG: No results for input(s): "GLUCAP" in the last 168 hours.  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   Surgical History:   Past Surgical History:  Procedure Laterality Date  . ABDOMINAL AORTOGRAM W/LOWER EXTREMITY Bilateral 08/20/2019   Procedure: ABDOMINAL AORTOGRAM W/LOWER EXTREMITY;  Surgeon: Cephus Shelling, MD;  Location: [facility] INVASIVE CV LAB;  Service: Cardiovascular;  Laterality: Bilateral;  . ABDOMINAL AORTOGRAM W/LOWER EXTREMITY N/A 08/26/2023   Procedure: ABDOMINAL AORTOGRAM W/LOWER EXTREMITY;  Surgeon: Maeola Harman, MD;  Location: [facility] INVASIVE CV LAB;  Service: Cardiovascular;  Laterality: N/A;  . ACHILLES TENDON SURGERY Right 08/26/2020   Procedure: ACHILLES TENDON LENGTHENING;  Surgeon: Edwin Cap, DPM;  Location: [facility] OR;  Service: Podiatry;  Laterality: Right;  . AMPUTATION Right 09/15/2019   Procedure: AMPUTATION RIGHT TOES One, Two, And Three;  Surgeon: Maeola Harman, MD;  Location: [facility] OR;  Service: Vascular;  Laterality: Right;  . AMPUTATION Left 09/21/2023   Procedure: AMPUTATION DIGIT;  Surgeon: Felecia Shelling, DPM;  Location: [facility] ORS;  Service: Orthopedics/Podiatry;  Laterality: Left;  . APPENDECTOMY    . CATARACT EXTRACTION W/PHACO Right 05/02/2018   Procedure: CATARACT EXTRACTION PHACO AND INTRAOCULAR LENS PLACEMENT (IOC);  Surgeon: Fabio Pierce, MD;  Location: [facility] ORS;  Service: Ophthalmology;  Laterality: Right;  CDE: 11.11  . CATARACT EXTRACTION W/PHACO Left 07/04/2018   Procedure: CATARACT EXTRACTION PHACO AND INTRAOCULAR LENS PLACEMENT (IOC);  Surgeon: Fabio Pierce, MD;  Location: [facility] ORS;  Service: Ophthalmology;  Laterality: Left;  CDE: 7.15  . CHOLECYSTECTOMY    . CYSTOSCOPY W/ URETERAL STENT PLACEMENT Right 11/22/2021   Procedure: CYSTOSCOPY WITH RETROGRADE PYELOGRAM/URETERAL STENT PLACEMENT;  Surgeon: Marcine Matar, MD;  Location: [facility] ORS;  Service: Urology;  Laterality: Right;  . CYSTOSCOPY WITH RETROGRADE PYELOGRAM, URETEROSCOPY  AND STENT PLACEMENT Right 02/01/2022   Procedure: CYSTOSCOPY WITH RETROGRADE PYELOGRAM, URETEROSCOPY AND STENT PLACEMENT;  Surgeon: Malen Gauze, MD;  Location: [facility] ORS;  Service: Urology;  Laterality: Right;  . ENDARTERECTOMY FEMORAL Left 08/27/2023   Procedure: RIGHT FEMORAL ENDARTERECTOMY;  Surgeon: Maeola Harman, MD;  Location: [facility] OR;  Service: Vascular;  Laterality: Left;  . FEMORAL-POPLITEAL BYPASS GRAFT Right 09/15/2019   Procedure: Right BYPASS GRAFT FEMORAL to Above Knee POPLITEAL ARTERY;  Surgeon: Maeola Harman, MD;  Location: [facility] OR;  Service: Vascular;  Laterality: Right;  . FEMORAL-POPLITEAL BYPASS GRAFT Right 01/26/2020   Procedure: IRRIGATION AND DEBRIDEMENT RIGHT FEMORAL POPLITEAL BYPASS SITE;  Surgeon: Maeola Harman, MD;  Location: [facility] OR;  Service: Vascular;  Laterality: Right;  . FEMORAL-POPLITEAL BYPASS GRAFT Left 08/27/2023   Procedure: BYPASS GRAFT LEFT COMMON FEMORAL-POPLITEAL ARTERY;  Surgeon: Maeola Harman, MD;  Location: [facility] OR;  Service: Vascular;  Laterality: Left;  . HOLMIUM LASER APPLICATION Right 02/01/2022   Procedure: HOLMIUM LASER APPLICATION;  Surgeon: Malen Gauze, MD;  Location: [facility] ORS;  Service: Urology;  Laterality: Right;  . MULTIPLE TOOTH EXTRACTIONS    .  ORCHIECTOMY    . PERIPHERAL VASCULAR INTERVENTION Left 08/20/2019   Procedure: PERIPHERAL VASCULAR INTERVENTION;  Surgeon: Cephus Shelling, MD;  Location: [facility] INVASIVE CV LAB;  Service: Cardiovascular;  Laterality: Left;  common/external iliac  . PERIPHERAL VASCULAR INTERVENTION Right 08/26/2023   Procedure: PERIPHERAL VASCULAR INTERVENTION;  Surgeon: Maeola Harman, MD;  Location: [facility] INVASIVE CV LAB;  Service: Cardiovascular;  Laterality: Right;  External Illiac  . 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   Procedure: TRANSMETATARSAL AMPUTATION;  Surgeon: Maeola Harman, MD;  Location: [facility] OR;  Service: Vascular;  Laterality: Right;   . TRANSMETATARSAL AMPUTATION Right 08/26/2020   Procedure: TRANSMETATARSAL AMPUTATION REVISION;  Surgeon: Edwin Cap, DPM;  Location: [facility] OR;  Service: Podiatry;  Laterality: Right;  Marland Kitchen VASECTOMY    . WOUND DEBRIDEMENT Right 08/26/2020   Procedure: EXCISION WOUND;  Surgeon: Edwin Cap, DPM;  Location: [facility] OR;  Service: Podiatry;  Laterality: Right;     Social History:   reports that he has been smoking cigarettes. He has a 30 pack-year smoking history. He has quit using smokeless tobacco.  His smokeless tobacco use included chew. He reports that he does not drink alcohol and does not use drugs.   Family History:  His family history includes Cancer in his mother; Coronary artery disease in his father and mother; Hypertension in his father.   Allergies: No Known Allergies   Home Medications: Prior to Admission medications   Medication Sig Start Date End Date Taking? Authorizing Provider  acetaminophen (TYLENOL) 325 MG tablet Take 2 tablets (650 mg total) by mouth every 4 (four) hours as needed for mild pain or moderate pain (or Fever >/= 101). 09/26/23   Lewie Chamber, MD  albuterol (VENTOLIN HFA) 108 (90 Base) MCG/ACT inhaler INHALE 2 PUFFS INTO THE LUNGS EVERY 4 HOURS AS NEEDED FOR WHEEZING OR SHORTNESS OF BREATH 08/09/21   Babs Sciara, MD  aspirin EC 81 MG tablet Take 1 tablet (81 mg total) by mouth daily. Swallow whole. 08/31/23   Osvaldo Shipper, MD  clopidogrel (PLAVIX) 75 MG tablet Take 1 tablet (75 mg total) by mouth daily. Patient not taking: Reported on 01/01/2024 09/22/23   Alwyn Ren, MD  cyanocobalamin 1000 MCG tablet Take 1 tablet (1,000 mcg total) by mouth daily. 08/31/23   Osvaldo Shipper, MD  FEROSUL 325 (65 Fe) MG tablet TAKE (1) TABLET BY MOUTH ON MONDAY, WEDNESDAY AND FRIDAY. 12/27/23   Babs Sciara, MD  pantoprazole (PROTONIX) 40 MG tablet TAKE (1) TABLET BY MOUTH ONCE DAILY. 09/22/23   Alwyn Ren, MD  rosuvastatin (CRESTOR) 20 MG tablet  Take 1 tablet (20 mg total) by mouth daily. 09/22/23   Alwyn Ren, MD  SYMBICORT 160-4.5 MCG/ACT inhaler INHALE (2) PUFFS INTO THE LUNGS TWICE DAILY FOR COPD. 02/07/24   Campbell Riches, NP    Critical care time:   The patient is critically ill with multiple organ system failure and requires high complexity decision making for assessment and support, frequent evaluation and titration of therapies, advanced monitoring, review of radiographic studies and interpretation of complex data.   Critical Care Time devoted to patient care services, exclusive of separately billable procedures, described in this note is *** minutes.  Tim Lair, PA-C [place] [facility] 02/28/24 12:00 AM  Please see [email or web address] for pager details.  From 7A-7P if no response, please call [phone] After hours, please call ELink [phone]

## 2024-02-28 NOTE — Progress Notes (Signed)
*  PRELIMINARY RESULTS* Echocardiogram 2D Echocardiogram has been performed.  Earlie Server Manaal Mandala 02/28/2024, 10:03 AM

## 2024-02-28 NOTE — Procedures (Signed)
 Central Venous Catheter Insertion Procedure Note  Bijan Ridgley  [id number]  [birth year]  Date:02/28/24  Time:2:58 AM   Provider Performing:Birch Farino Salena Saner Katrinka Blazing     Procedure: Insertion of Non-tunneled Central Venous Catheter(36556) with US guidance ([id number])   Indication(s) Medication administration  Consent Unable to obtain consent due to emergent nature of procedure.  Anesthesia Topical only with 1% lidocaine   Timeout Verified patient identification, verified procedure, site/side was marked, verified correct patient position, special equipment/implants available, medications/allergies/relevant history reviewed, required imaging and test results available.  Sterile Technique Maximal sterile technique including full sterile barrier drape, hand hygiene, sterile gown, sterile gloves, mask, hair covering, sterile ultrasound probe cover (if used).  Procedure Description Area of catheter insertion was cleaned with chlorhexidine and draped in sterile fashion.  With real-time ultrasound guidance a HD catheter was placed into the right femoral vein. Nonpulsatile blood flow and easy flushing noted in all ports.  The catheter was sutured in place and sterile dressing applied.  Complications/Tolerance None; patient tolerated the procedure well. Chest X-ray is ordered to verify placement for internal jugular or subclavian cannulation.   Chest x-ray is not ordered for femoral cannulation.  EBL Minimal  Specimen(s) None   Jarrette Dehner AGACNP-BC   [place] [facility] 02/28/2024, 2:59 AM  Please see [email or web address] for pager details.  From 7A-7P if no response, please call [phone]. After hours, please call ELink [phone].

## 2024-02-28 NOTE — Procedures (Signed)
 Arterial Catheter Insertion Procedure Note  Raymond Walker  [id number]  [birth year]  Date:02/28/24  Time:2:59 AM    Provider Performing: Henrene Hawking  Supervised by Theodis Sato CCM PA    Procedure: Insertion of Arterial Line (73710) with US guidance (62694)   Indication(s) Blood pressure monitoring and/or need for frequent ABGs  Consent Unable to obtain consent due to emergent nature of procedure.  Anesthesia None   Time Out Verified patient identification, verified procedure, site/side was marked, verified correct patient position, special equipment/implants available, medications/allergies/relevant history reviewed, required imaging and test results available.   Sterile Technique Maximal sterile technique including full sterile barrier drape, hand hygiene, sterile gown, sterile gloves, mask, hair covering, sterile ultrasound probe cover (if used).   Procedure Description Area of catheter insertion was cleaned with chlorhexidine and draped in sterile fashion. With real-time ultrasound guidance an arterial catheter was placed into the right radial artery.  Appropriate arterial tracings confirmed on monitor.     Complications/Tolerance None; patient tolerated the procedure well.   EBL Minimal   Specimen(s) None   [name] AGACNP-BC   [facility] 02/28/2024, 3:00 AM  Please see [email or web address] for pager details.  From 7A-7P if no response, please call [phone]. After hours, please call ELink [phone].

## 2024-02-28 NOTE — Progress Notes (Addendum)
 eLink Physician-Brief Progress Note Patient Name: Raymond Walker. DOB: [birth year] MRN: [id number]   Date of Service  02/28/2024  HPI/Events of Note  73 year old man who presented to [facility] 3/14 as a transfer from [facility] for AECOPD and hypoxemia requiring intubation.   Rhythm changed from A-fib to a flutter, rate controlled with EKG showing rate of 67  eICU Interventions  Consider initiation of systemic anticoagulation.  No immediate intervention at this time.   4098 -had an episode of increased agitation maximized on Precedex at 0.5.  Will administer one-time Versed push 2 mg and increase Precedex to 0.7 at the ceiling of 1.0.  Intervention Category Intermediate Interventions: Arrhythmia - evaluation and management  Raymond Walker 02/28/2024, 9:16 PM

## 2024-02-28 NOTE — Progress Notes (Signed)
 NAME:  Raymond Walker., MRN:  [id number], DOB:  10/25/1951, LOS: 1 ADMISSION DATE:  02/27/2024 CONSULTATION DATE:  02/28/2024 REFERRING MD:  Rhae Hammock - EDP ([facility]) CHIEF COMPLAINT:  AECOPD 2/2 Flu A with hypoxemia requiring intubation   History of Present Illness:  73 year old man who presented to [facility] 3/14 as a transfer from [facility] for AECOPD and hypoxemia requiring intubation. PMHx significant for HTN, COPD, PAD (on DAPT) s/p R foot TMA, osteomyelitis, recurrent pancreatitis, nephrolithiasis, remote seizure history (ages [age]), cognitive/developmental delay.   Patient initially presented to [facility] from [facility] with SOB x 1 week. Noted to be wheezing on arrival and neb administered by EMS. SpO2 was reportedly 80% on RA and patient was placed on NRB; BiPAP was initiated after patient was noted to have increased WOB and unfortunately patient was unable to tolerate this. He had persistent decline in his mental status; per family/HCPOA, patient is a DNR but would want intubation. Subsequently intubated for airway protection.   Labs were notable for WBC 10.7, Hgb 14.4, Plt 160. Na 141, K 4.6, CO2 29, Cr 1.13. Trop 59. BNP 169. VBG pH 7.1/CO2 105/bicarb 33. Flu A positive. Post-intubation ABG 7.22/56/287/23.1. CXR demonstrated emphysematous changes.   Given patient's worsening clinical status and s/p intubation, transferred to [facility] for further care.  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 C/f AECOPD. Flu A+. NRB > BiPAP > intubated for airway protection. CXR with emphysematous changes. 3/14 - Transferred to [facility] for further care post-intubation.    Significant Hospital Events: Including procedures, antibiotic start and stop dates in addition to other pertinent events   3/13 - Presented to [facility] for SOB x 1 week, wheezing. C/f AECOPD. Flu A+. NRB > BiPAP > intubated for airway protection. CXR with emphysematous changes. 3/14 - Transferred to [facility] for further care post-intubation. 3/14 Broad spectrum antibiotics and levophed for shock  Interim History / Subjective:  Patient has been sedated, minimally responsive. Breath stacking on the ventilator. Initially hypothermic, now normmothermia. MAP >65 on levo 29.  Last gas:  7.1. 60.96, mildly improving  Objective   Blood pressure 116/84, pulse 86, temperature 99.1 F (37.3 C), resp. rate (!) 32, height 5\' 11"  (1.803 m), weight 67 kg, SpO2 98%.    Vent Mode: PRVC FiO2 (%):  [40 %-100 %] 40 % Set Rate:  [22 bmp-30 bmp] 30 bmp Vt Set:  [500 mL-600 mL] 550 mL PEEP:  [5 cmH20-6 cmH20] 5 cmH20 Plateau Pressure:  [22 cmH20]  22 cmH20   Intake/Output Summary (Last 24 hours) at 02/28/2024 1610 Last data filed at 02/28/2024 0400 Gross per 24 hour  Intake 384.98 ml  Output 75 ml  Net 309.98 ml   Filed Weights   02/27/24 2115 02/28/24 0127  Weight: 63 kg 67 kg    Examination: General: Chronically ill appearing man in bed HENT: ET tube in place  Lungs: Good air movement bilaterally, anteriorly while ventilated Cardiovascular: irregular rate, no murmurs Abdomen: soft, non distended Extremities: Warm and dry. No LE edema Neuro: Sedated. Not  responding to pain GU: urethral catheter in place  Pertinent labs: WBC 17.7 Procal: 0.15  Cr 1.52 GFR 48 Lactic acid: 1.7 MRSA nares: not detected Strep pneumo urinary Ag - neg Legionella ag; pending  Tracheal aspirate: Gram positive rods Troponin 79 <59  Imaging 3/13 XR: emphysematous changes. NO acute infiltrates or consolidations  Resolved Hospital Problem list     Assessment & Plan:   Undifferentiated shock Influenza A infection  - Suspect hypovolemic with poor po intake vs septic from Flu A - On Levophed - Will transition to vasopressin and add hydrocortisone 100 mg q8HR - s/p 2L NS, will start 1107mL/HR for 10 hrs. IF TTE normal EF, will speed rate - MAP goal >65 - Follow upTTE today - Follow blood cultures 3/14, post antibiotic initiation  Acute hypoxemic respiratory failure COPD exacerbation due to influenza A No acute consolidations on XR. Currently ventilated. Initially attempted to decrease RR without fentanyl, but low minute ventilation. Will attempt to increase rate to 18 and recheck blood gas this afternoon - Follow ABG - On Ceftriaxone and azithromycin - Tamiflu x 5 days - S/p solumedrol 125 mg; currently on hydrocortisone - Nebulized LAMA, LABA, and ICS  AKI Suspect pre-renal given acute illness and hypotension - Fluids - Follow U/A and microscopy - Trend RFP - Foley cath in  HTN PAD s/p R TMA - Currently on ASA  GERD  - PPI  Best Practice (right click and "Reselect all SmartList Selections" daily)   Diet/type: NPO DVT prophylaxis: LMWH GI prophylaxis: PPI Lines: Central line Foley:  Yes, and it is still needed Code Status:  DNR but desires intubation Last date of multidisciplinary goals of care discussion [--]  Labs   CBC: Recent Labs  Lab 02/27/24 2148 02/28/24 0322 02/28/24 0325 02/28/24 0528  WBC 10.7*  --  17.7*  --   HGB 14.4 15.6 15.0 15.3  HCT 46.6 46.0 47.6 45.0  MCV 86.9  --  84.4  --   PLT 160  --  197  --      Basic Metabolic Panel: Recent Labs  Lab 02/27/24 2148 02/28/24 0322 02/28/24 0325 02/28/24 0528  NA 141 140 138 139  K 4.6 4.3 4.2 4.4  CL 103  --  106  --   CO2 29  --  21*  --   GLUCOSE 203*  --  234*  --   BUN 23  --  24*  --   CREATININE 1.13  --  1.52*  --   CALCIUM 8.6*  --  8.5*  --   MG  --   --  2.6*  --   PHOS  --   --  6.3*  --    GFR: Estimated Creatinine Clearance: 41 mL/min (A) (by C-G formula based on SCr of 1.52 mg/dL (H)). Recent Labs  Lab 02/27/24 2148 02/28/24 0324 02/28/24 0325  WBC 10.7*  --  17.7*  LATICACIDVEN  --  1.7  --     Liver Function Tests: No results for input(s): "AST", "ALT", "ALKPHOS", "BILITOT", "PROT", "ALBUMIN" in the last 168 hours. No results for input(s): "LIPASE", "AMYLASE" in the last 168 hours. No results for input(s): "AMMONIA" in the last 168 hours.  ABG    Component Value Date/Time   PHART 7.193 (LL) 02/28/2024 0528   PCO2ART 60.6 (H) 02/28/2024 0528   PO2ART 96 02/28/2024 0528   HCO3 23.3 02/28/2024 0528   TCO2 25 02/28/2024 0528   ACIDBASEDEF 6.0 (H) 02/28/2024 0528   O2SAT 95 02/28/2024 0528     Coagulation Profile: No results for input(s): "INR", "PROTIME" in the last 168 hours.  Cardiac Enzymes: No results for input(s): "CKTOTAL", "CKMB", "CKMBINDEX", "TROPONINI" in the last 168 hours.  HbA1C: Hgb A1c MFr Bld  Date/Time Value Ref Range Status  08/22/2023 08:59 PM 6.1 (H) 4.8 - 5.6 % Final    Comment:    (NOTE) Pre diabetes:          5.7%-6.4%  Diabetes:              >6.4%  Glycemic control for   <7.0% adults with diabetes   06/02/2021 11:01 AM 6.2 (H) 4.8 - 5.6 % Final    Comment:             Prediabetes: 5.7 - 6.4          Diabetes: >6.4          Glycemic control for adults with diabetes: <7.0     CBG: No results for input(s): "GLUCAP" in the last 168 hours.  Review of Systems:     Past Medical History:  He,  has a past medical history of Caregiver with fatigue (05/02/2020), COPD  (chronic obstructive pulmonary disease) (HCC), Deformity, Erectile dysfunction (05/02/2020), Full dentures, History of kidney stones, History of seizures (05/31/2020), Hypertension, Impaired glucose tolerance, PAD (peripheral artery disease) (HCC), Pancreatitis, recurrent, Pneumonia, Status post transmetatarsal amputation of foot, right (HCC) (12/12/2022), and Thrombocytopenia (HCC) (08/10/2018).   Surgical History:   Past Surgical History:  Procedure Laterality Date   ABDOMINAL AORTOGRAM W/LOWER EXTREMITY Bilateral 08/20/2019   Procedure: ABDOMINAL AORTOGRAM W/LOWER EXTREMITY;  Surgeon: Cephus Shelling, MD;  Location: [facility] INVASIVE CV LAB;  Service: Cardiovascular;  Laterality: Bilateral;   ABDOMINAL AORTOGRAM W/LOWER EXTREMITY N/A 08/26/2023   Procedure: ABDOMINAL AORTOGRAM W/LOWER EXTREMITY;  Surgeon: Maeola Harman, MD;  Location: [facility] INVASIVE CV LAB;  Service: Cardiovascular;  Laterality: N/A;   ACHILLES TENDON SURGERY Right 08/26/2020   Procedure: ACHILLES TENDON LENGTHENING;  Surgeon: Edwin Cap, DPM;  Location: [facility] OR;  Service: Podiatry;  Laterality: Right;   AMPUTATION Right 09/15/2019   Procedure: AMPUTATION RIGHT TOES One, Two, And Three;  Surgeon: Maeola Harman, MD;  Location: [facility] OR;  Service: Vascular;  Laterality: Right;   AMPUTATION Left 09/21/2023   Procedure: AMPUTATION DIGIT;  Surgeon: Felecia Shelling, DPM;  Location: [facility] ORS;  Service: Orthopedics/Podiatry;  Laterality: Left;   APPENDECTOMY     CATARACT EXTRACTION W/PHACO Right 05/02/2018   Procedure: CATARACT EXTRACTION PHACO AND INTRAOCULAR LENS PLACEMENT (IOC);  Surgeon: Fabio Pierce, MD;  Location: [facility] ORS;  Service: Ophthalmology;  Laterality: Right;  CDE: 11.11   CATARACT EXTRACTION W/PHACO Left 07/04/2018   Procedure: CATARACT EXTRACTION PHACO AND INTRAOCULAR LENS PLACEMENT (IOC);  Surgeon: Fabio Pierce, MD;  Location: [facility] ORS;  Service: Ophthalmology;  Laterality: Left;  CDE: 7.15    CHOLECYSTECTOMY     CYSTOSCOPY W/ URETERAL STENT PLACEMENT  Right 11/22/2021   Procedure: CYSTOSCOPY WITH RETROGRADE PYELOGRAM/URETERAL STENT PLACEMENT;  Surgeon: Marcine Matar, MD;  Location: [facility] ORS;  Service: Urology;  Laterality: Right;   CYSTOSCOPY WITH RETROGRADE PYELOGRAM, URETEROSCOPY AND STENT PLACEMENT Right 02/01/2022   Procedure: CYSTOSCOPY WITH RETROGRADE PYELOGRAM, URETEROSCOPY AND STENT PLACEMENT;  Surgeon: Malen Gauze, MD;  Location: [facility] ORS;  Service: Urology;  Laterality: Right;   ENDARTERECTOMY FEMORAL Left 08/27/2023   Procedure: RIGHT FEMORAL ENDARTERECTOMY;  Surgeon: Maeola Harman, MD;  Location: [facility] OR;  Service: Vascular;  Laterality: Left;   FEMORAL-POPLITEAL BYPASS GRAFT Right 09/15/2019   Procedure: Right BYPASS GRAFT FEMORAL to Above Knee POPLITEAL ARTERY;  Surgeon: Maeola Harman, MD;  Location: [facility] OR;  Service: Vascular;  Laterality: Right;   FEMORAL-POPLITEAL BYPASS GRAFT Right 01/26/2020   Procedure: IRRIGATION AND DEBRIDEMENT RIGHT FEMORAL POPLITEAL BYPASS SITE;  Surgeon: Maeola Harman, MD;  Location: [facility] OR;  Service: Vascular;  Laterality: Right;   FEMORAL-POPLITEAL BYPASS GRAFT Left 08/27/2023   Procedure: BYPASS GRAFT LEFT COMMON FEMORAL-POPLITEAL ARTERY;  Surgeon: Maeola Harman, MD;  Location[facility] OR;  Service: Vascular;  Laterality: Left;   HOLMIUM LASER APPLICATION Right 02/01/2022   Procedure: HOLMIUM LASER APPLICATION;  Surgeon: Malen Gauze, MD;  Location: [facility]S;  Service: Urology;  Laterality: Right;   MULTIPLE TOOTH EXTRACTIONS     ORCHIECTOMY     PERIPHERAL VASCULAR INTERVENTION Left 08/20/2019   Procedure: PERIPHERAL VASCULAR INTERVENTION;  Surgeon: Cephus Shelling, MD;  Location: [facility] INVASIVE CV LAB;  Service: Cardiovascular;  Laterality: Left;  common/external iliac   PERIPHERAL VASCULAR INTERVENTION Right 08/26/2023   Procedure: PERIPHERAL VASCULAR INTERVENTION;  Surgeon: Maeola Harman, MD;  Location: [facility] INVASIVE CV LAB;  Service: Cardiovascular;  Laterality: Right;  External Illiac   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   Procedure: TRANSMETATARSAL AMPUTATION;  Surgeon: Maeola Harman, MD;  Location: [facility] OR;  Service: Vascular;  Laterality: Right;   TRANSMETATARSAL AMPUTATION Right 08/26/2020   Procedure: TRANSMETATARSAL AMPUTATION REVISION;  Surgeon: Edwin Cap, DPM;  Location: [facility] OR;  Service: Podiatry;  Laterality: Right;   VASECTOMY     WOUND DEBRIDEMENT Right 08/26/2020   Procedure: EXCISION WOUND;  Surgeon: Edwin Cap, DPM;  Location: [facility] OR;  Service: Podiatry;  Laterality: Right;     Social History:   reports that he has been smoking cigarettes. He has a 30 pack-year smoking history. He has quit using smokeless tobacco.  His smokeless tobacco use included chew. He reports that he does not drink alcohol and does not use drugs.   Family History:  His family history includes Cancer in his mother; Coronary artery disease in his father and mother; Hypertension in his father.   Allergies No Known Allergies   Home Medications  Prior to Admission medications   Medication Sig Start Date End Date Taking? Authorizing Provider  acetaminophen (TYLENOL) 325 MG tablet Take 2 tablets (650 mg total) by mouth every 4 (four) hours as needed for mild pain or moderate pain (or Fever >/= 101). 09/26/23   Lewie Chamber, MD  albuterol (VENTOLIN HFA) 108 (90 Base) MCG/ACT inhaler INHALE 2 PUFFS INTO THE LUNGS EVERY 4 HOURS AS NEEDED FOR WHEEZING OR SHORTNESS OF BREATH 08/09/21   Babs Sciara, MD  aspirin EC 81 MG tablet Take 1 tablet (81 mg total) by mouth daily. Swallow whole. 08/31/23   Osvaldo Shipper, MD  clopidogrel (PLAVIX) 75 MG tablet Take 1 tablet (75 mg total) by mouth daily. Patient not taking:  Reported on 01/01/2024 09/22/23   Alwyn Ren, MD  cyanocobalamin 1000 MCG tablet Take 1 tablet (1,000 mcg total) by mouth daily. 08/31/23    Osvaldo Shipper, MD  FEROSUL 325 (65 Fe) MG tablet TAKE (1) TABLET BY MOUTH ON MONDAY, WEDNESDAY AND FRIDAY. 12/27/23   Babs Sciara, MD  pantoprazole (PROTONIX) 40 MG tablet TAKE (1) TABLET BY MOUTH ONCE DAILY. 09/22/23   Alwyn Ren, MD  rosuvastatin (CRESTOR) 20 MG tablet Take 1 tablet (20 mg total) by mouth daily. 09/22/23   Alwyn Ren, MD  SYMBICORT 160-4.5 MCG/ACT inhaler INHALE (2) PUFFS INTO THE LUNGS TWICE DAILY FOR COPD. 02/07/24   Campbell Riches, NP     Critical care time:       Morene Crocker, MD [facility] [place] [facility] Program - PGY-2 02/28/2024, 7:02 AM Pager# [phone]

## 2024-02-28 NOTE — Progress Notes (Addendum)
 Initial Nutrition Assessment  DOCUMENTATION CODES:  Severe malnutrition in context of social or environmental circumstances  INTERVENTION:  Once in place, recommend initiating tube feeding via cortrak: Osmolite 1.5 at 55 ml/h (1320 ml per day) Prosource TF20 60 ml 1x/d Start at 15 and advance by 10mL q8h to goal of 36mL/h Provides 2060 kcal, 103 gm protein, 1006 ml free water daily Monitor magnesium and phosphorus every 12 hours x 4 occurrences, MD to replete as needed, as pt is at risk for refeeding syndrome given severe malnutrition. Thiamine 100mg  x 5 days   NUTRITION DIAGNOSIS:  Severe Malnutrition related to social / environmental circumstances (communal living, inadequate intake) as evidenced by severe muscle depletion, severe fat depletion.  GOAL:  Patient will meet greater than or equal to 90% of their needs  MONITOR:  TF tolerance, Skin, I & O's, Vent status, Labs  REASON FOR ASSESSMENT:  Ventilator    ASSESSMENT:  Pt with hx of COPD, HTN, PAD, and developmental delay presented to ED from his LTC center with worsening SOB over the last week. Found to be positive for influenza A and required intubation.   3/13 - presented to ED, intubated 3/14 - cortrak to be placed  Patient is currently intubated on ventilator support. No family at bedside available to provide a hx. On exam, pt extremely thin with severe loss of muscle and fat.   Discussed with resident, would benefit from cortrak tube to provide enteral nutrition until pt able to demonstrate he can meet his needs orally. Order placed. Also reports that PMT will be consulted to help determine GOC moving forward.  MV: 16.7 L/min Temp (24hrs), Avg:98.4 F (36.9 C), Min:95.9 F (35.5 C), Max:101.1 F (38.4 C) MAP (cuff):  Admit weight: 63 kg   Current weight: 67 kg 13% weight loss noted over the last 6 months, which is severe for timeframe if accurate    Intake/Output Summary (Last 24 hours) at 02/28/2024  1104 Last data filed at 02/28/2024 1016 Gross per 24 hour  Intake 1478.93 ml  Output 75 ml  Net 1403.93 ml  Net IO Since Admission: 1,403.93 mL [02/28/24 1104]  Drains/Lines: CVC Triple lumen, right femoral UOP 75mL out since admission  Nutritionally Relevant Medications: Scheduled Meds:  docusate  100 mg Per Tube BID   insulin aspart  0-9 Units Subcutaneous Q4H   pantoprazole IV  40 mg Intravenous QHS   polyethylene glycol  17 g Per Tube Daily   Continuous Infusions:  azithromycin 500 mg (02/28/24 0615)   cefTRIAXone (ROCEPHIN)  IV 2 g (02/28/24 0523)   norepinephrine (LEVOPHED) Adult infusion 29 mcg/min (02/28/24 0706)   Labs Reviewed: BUN 24, creatinine 1.52 Phosphorus 6.3 Magnesium 2.6 CBG ranges from 177-239 mg/dL over the last 24 hours HgbA1c 6.1%  NUTRITION - FOCUSED PHYSICAL EXAM: Flowsheet Row Most Recent Value  Orbital Region Severe depletion  Upper Arm Region Severe depletion  Thoracic and Lumbar Region Moderate depletion  Buccal Region Severe depletion  Temple Region Severe depletion  Clavicle Bone Region Moderate depletion  Clavicle and Acromion Bone Region Severe depletion  Scapular Bone Region Severe depletion  Dorsal Hand Severe depletion  Patellar Region Severe depletion  Anterior Thigh Region Severe depletion  Posterior Calf Region Severe depletion  Edema (RD Assessment) None  Hair Reviewed  Eyes Reviewed  Mouth Reviewed  Skin Reviewed  [dry, flacky]  Nails Reviewed  [thin]   Diet Order:   Diet Order  Diet NPO time specified  Diet effective now                  EDUCATION NEEDS:  Not appropriate for education at this time  Skin:  Skin Assessment: Reviewed RN Assessment  Last BM:  unsure  Height:  Ht Readings from Last 1 Encounters:  02/28/24 5\' 11"  (1.803 m)    Weight:  Wt Readings from Last 1 Encounters:  02/28/24 67 kg   Ideal Body Weight:  78.2 kg  BMI:  Body mass index is 20.6 kg/m.  Estimated  Nutritional Needs:  Kcal:  1900-2100 kcal/d Protein:  100-115 g/d Fluid:  >/=1.9L/d    Greig Castilla, RD, LDN Registered Dietitian II Please reach out via secure chat Weekend on-call pager # available in [facility]

## 2024-02-28 NOTE — H&P (Addendum)
 NAME:  Raymond Appleby., MRN:  [id number], DOB:  30-Dec-1950, LOS: 1 ADMISSION DATE:  02/27/2024 CONSULTATION DATE:  02/28/2024 REFERRING MD:  Rhae Hammock - EDP ([facility]) CHIEF COMPLAINT:  AECOPD 2/2 Flu A with hypoxemia requiring intubation   History of Present Illness:  73 year old man who presented to [facility] 3/14 as a transfer from [facility] for AECOPD and hypoxemia requiring intubation. PMHx significant for HTN, COPD, PAD (on DAPT) s/p R foot TMA, osteomyelitis, recurrent pancreatitis, nephrolithiasis, remote seizure history (ages 50-11), cognitive/developmental delay.  Patient initially presented to [facility] from [facility] with SOB x 1 week. Noted to be wheezing on arrival and neb administered by EMS. SpO2 was reportedly 80% on RA and patient was placed on NRB; BiPAP was initiated after patient was noted to have increased WOB and unfortunately patient was unable to tolerate this. He had persistent decline in his mental status; per family/HCPOA, patient is a DNR but would want intubation. Subsequently intubated for airway protection.  Labs were notable for WBC 10.7, Hgb 14.4, Plt 160. Na 141, K 4.6, CO2 29, Cr 1.13. Trop 59. BNP 169. VBG pH 7.1/CO2 105/bicarb 33. Flu A positive. Post-intubation ABG 7.22/56/287/23.1. CXR demonstrated emphysematous changes.  Given patient's worsening clinical status and s/p intubation, transferred to [facility] for further care.  Pertinent Medical History:   Past Medical History:  Diagnosis Date   Caregiver with fatigue 05/02/2020   COPD (chronic obstructive pulmonary disease) (HCC)    Deformity    equinus and metatarsal   Erectile dysfunction 05/02/2020   Full dentures    History of kidney stones    History of seizures 05/31/2020   Between ages 61 and 20   Hypertension    Impaired glucose tolerance    PAD (peripheral artery disease) (HCC)    S/p R TMA   Pancreatitis, recurrent    Pneumonia    Status post transmetatarsal amputation of foot, right (HCC)  12/12/2022   Thrombocytopenia (HCC) 08/10/2018   Staying in the low 100s will follow closely   Significant Hospital Events: Including procedures, antibiotic start and stop dates in addition to other pertinent events   3/13 - Presented to [facility] for SOB x 1 week, wheezing. C/f AECOPD. Flu A+. NRB > BiPAP > intubated for airway protection. CXR with emphysematous changes. 3/14 - Transferred to [facility] for further care post-intubation.  Interim History / Subjective:  PCCM consulted for ICU admission/transfer from [facility].  Objective:  Blood pressure 96/78, pulse 92, temperature (!) 96.5 F (35.8 C), resp. rate (!) 22, height 5\' 11"  (1.803 m), weight 63 kg, SpO2 97%.    Vent Mode: PRVC FiO2 (%):  [80 %-100 %] 80 % Set Rate:  [22 bmp] 22 bmp Vt Set:  [600 mL] 600 mL PEEP:  [6 cmH20] 6 cmH20 Plateau Pressure:  [22 cmH20] 22 cmH20   Intake/Output Summary (Last 24 hours) at 02/28/2024 0106 Last data filed at 02/27/2024 2245 Gross per 24 hour  Intake 17.48 ml  Output --  Net 17.48 ml   Filed Weights   02/27/24 2115  Weight: 63 kg   Physical Examination: General: Acute-on-chronically ill-appearing older man in NAD. HEENT: [place]/AT, anicteric sclera, PERRL 2mm reactive, moist mucous membranes. +Temporal wasting. Neuro: Sedated. Does not respond to verbal, tactile or noxious stimuli. Not following commands. No spontaneous movement of extremities noted. No cough, gag.  CV: Irregularly irregular rhythm, rate 90s-100s, no m/g/r. PULM: Breathing even and unlabored on vent (PEEP 5 ,FiO2 60%). Lung field with faint  wheeze, diminished bilaterally. GI: Soft, nontender, nondistended. Normoactive bowel sounds. Extremities: No LE edema noted. Well-healed L great toe (partial first ray) amputation, R TMA. Skin: Warm/dry.  Resolved Hospital Problem List:    Assessment & Plan:  Undifferentiated shock, presume septic in the setting of ?PNA Influenza A infection - Admit to ICU for close monitoring - Goal MAP >  65 - Fluid resuscitation as tolerated - Levophed titrated to goal MAP (prefer cuff over A-line, narrow pulse pressure with known PAD) - Trend WBC (climbing), fever curve, LA - F/u PCT, Cx data, legionella/strep - Continue Tamiflu - CAP coverage with ceftriaxone/azithromycin  Acute hypoxemic respiratory failure in the setting of Flu A and AECOPD History of COPD - Continue full vent support (4-8cc/kg IBW) - Wean FiO2 for O2 sat > 90% - Daily WUA/SBT, vent settings too significant at present - VAP bundle - Bronchodilators as indicated (Brovana/Pulmicort in place of Symbicort, albuterol nebs PRN) - Continue steroids - Pulmonary hygiene - PAD protocol for sedation: Propofol and Fentanyl for goal RASS 0 to -1  HTN PAD s/p R TMA On DAPT (ASA/Plavix) and statin. - Resume home medications as clinically appropriate  GERD - PPI  Best Practice: (right click and "Reselect all SmartList Selections" daily)   Diet/type: NPO DVT prophylaxis: SCDs, SQH GI prophylaxis: PPI Lines: N/A Foley:  Yes, and it is still needed Code Status:  DNR (pre-arrest interventions desired) Last date of multidisciplinary goals of care discussion [Pending]  Labs:  CBC: Recent Labs  Lab 02/27/24 2148  WBC 10.7*  HGB 14.4  HCT 46.6  MCV 86.9  PLT 160   Basic Metabolic Panel: Recent Labs  Lab 02/27/24 2148  NA 141  K 4.6  CL 103  CO2 29  GLUCOSE 203*  BUN 23  CREATININE 1.13  CALCIUM 8.6*   GFR: Estimated Creatinine Clearance: 51.9 mL/min (by C-G formula based on SCr of 1.13 mg/dL). Recent Labs  Lab 02/27/24 2148  WBC 10.7*   Liver Function Tests: No results for input(s): "AST", "ALT", "ALKPHOS", "BILITOT", "PROT", "ALBUMIN" in the last 168 hours. No results for input(s): "LIPASE", "AMYLASE" in the last 168 hours. No results for input(s): "AMMONIA" in the last 168 hours.  ABG:    Component Value Date/Time   PHART 7.22 (L) 02/27/2024 2247   PCO2ART 56 (H) 02/27/2024 2247   PO2ART  287 (H) 02/27/2024 2247   HCO3 23.1 02/27/2024 2247   ACIDBASEDEF 5.9 (H) 02/27/2024 2247   O2SAT 100 02/27/2024 2247    Coagulation Profile: No results for input(s): "INR", "PROTIME" in the last 168 hours.  Cardiac Enzymes: No results for input(s): "CKTOTAL", "CKMB", "CKMBINDEX", "TROPONINI" in the last 168 hours.  HbA1C: Hgb A1c MFr Bld  Date/Time Value Ref Range Status  08/22/2023 08:59 PM 6.1 (H) 4.8 - 5.6 % Final    Comment:    (NOTE) Pre diabetes:          5.7%-6.4%  Diabetes:              >6.4%  Glycemic control for   <7.0% adults with diabetes   06/02/2021 11:01 AM 6.2 (H) 4.8 - 5.6 % Final    Comment:             Prediabetes: 5.7 - 6.4          Diabetes: >6.4          Glycemic control for adults with diabetes: <7.0    CBG: No results for input(s): "GLUCAP" in the last 168  hours.  Review of Systems:   Patient is encephalopathic and/or intubated; therefore, history has been obtained from chart review.   Past Medical History:  He,  has a past medical history of Caregiver with fatigue (05/02/2020), COPD (chronic obstructive pulmonary disease) (HCC), Deformity, Erectile dysfunction (05/02/2020), Full dentures, History of kidney stones, History of seizures (05/31/2020), Hypertension, Impaired glucose tolerance, PAD (peripheral artery disease) (HCC), Pancreatitis, recurrent, Pneumonia, Status post transmetatarsal amputation of foot, right (HCC) (12/12/2022), and Thrombocytopenia (HCC) (08/10/2018).   Surgical History:   Past Surgical History:  Procedure Laterality Date   ABDOMINAL AORTOGRAM W/LOWER EXTREMITY Bilateral 08/20/2019   Procedure: ABDOMINAL AORTOGRAM W/LOWER EXTREMITY;  Surgeon: Cephus Shelling, MD;  [facility]: [facility] INVASIVE CV LAB;  Service: Cardiovascular;  Laterality: Bilateral;   ABDOMINAL AORTOGRAM W/LOWER EXTREMITY N/A 08/26/2023   Procedure: ABDOMINAL AORTOGRAM W/LOWER EXTREMITY;  Surgeon: Maeola Harman, MD;  Location: [facility] INVASIVE CV LAB;   Service: Cardiovascular;  Laterality: N/A;   ACHILLES TENDON SURGERY Right 08/26/2020   Procedure: ACHILLES TENDON LENGTHENING;  Surgeon: Edwin Cap, DPM;  Location: [facility] OR;  Service: Podiatry;  Laterality: Right;   AMPUTATION Right 09/15/2019   Procedure: AMPUTATION RIGHT TOES One, Two, And Three;  Surgeon: Maeola Harman, MD;  Location: [facility] OR;  Service: Vascular;  Laterality: Right;   AMPUTATION Left 09/21/2023   Procedure: AMPUTATION DIGIT;  Surgeon: Felecia Shelling, DPM;  Location: [facility] ORS;  Service: Orthopedics/Podiatry;  Laterality: Left;   APPENDECTOMY     CATARACT EXTRACTION W/PHACO Right 05/02/2018   Procedure: CATARACT EXTRACTION PHACO AND INTRAOCULAR LENS PLACEMENT (IOC);  Surgeon: Fabio Pierce, MD;  Location: [facility] ORS;  Service: Ophthalmology;  Laterality: Right;  CDE: 11.11   CATARACT EXTRACTION W/PHACO Left 07/04/2018   Procedure: CATARACT EXTRACTION PHACO AND INTRAOCULAR LENS PLACEMENT (IOC);  Surgeon: Fabio Pierce, MD;  Location: [facility] ORS;  Service: Ophthalmology;  Laterality: Left;  CDE: 7.15   CHOLECYSTECTOMY     CYSTOSCOPY W/ URETERAL STENT PLACEMENT Right 11/22/2021   Procedure: CYSTOSCOPY WITH RETROGRADE PYELOGRAM/URETERAL STENT PLACEMENT;  Surgeon: Marcine Matar, MD;  Location: [facility] ORS;  Service: Urology;  Laterality: Right;   CYSTOSCOPY WITH RETROGRADE PYELOGRAM, URETEROSCOPY AND STENT PLACEMENT Right 02/01/2022   Procedure: CYSTOSCOPY WITH RETROGRADE PYELOGRAM, URETEROSCOPY AND STENT PLACEMENT;  Surgeon: Malen Gauze, MD;  Location: [facility] ORS;  Service: Urology;  Laterality: Right;   ENDARTERECTOMY FEMORAL Left 08/27/2023   Procedure: RIGHT FEMORAL ENDARTERECTOMY;  Surgeon: Maeola Harman, MD;  Location[facility] OR;  Service: Vascular;  Laterality: Left;   FEMORAL-POPLITEAL BYPASS GRAFT Right 09/15/2019   Procedure: Right BYPASS GRAFT FEMORAL to Above Knee POPLITEAL ARTERY;  Surgeon: Maeola Harman, MD;  Location: [facility] OR;  Service: Vascular;   Laterality: Right;   FEMORAL-POPLITEAL BYPASS GRAFT Right 01/26/2020   Procedure: IRRIGATION AND DEBRIDEMENT RIGHT FEMORAL POPLITEAL BYPASS SITE;  Surgeon: Maeola Harman, MD;  Location: [facility] OR;  Service: Vascular;  Laterality: Right;   FEMORAL-POPLITEAL BYPASS GRAFT Left 08/27/2023   Procedure: BYPASS GRAFT LEFT COMMON FEMORAL-POPLITEAL ARTERY;  Surgeon: Maeola Harman, MD;  Location: [facility] OR;  Service: Vascular;  Laterality: Left;   HOLMIUM LASER APPLICATION Right 02/01/2022   Procedure: HOLMIUM LASER APPLICATION;  Surgeon: Malen Gauze, MD;  Location: [facility]S;  Service: Urology;  Laterality: Right;   MULTIPLE TOOTH EXTRACTIONS     ORCHIECTOMY     PERIPHERAL VASCULAR INTERVENTION Left 08/20/2019   Procedure: PERIPHERAL VASCULAR INTERVENTION;  Surgeon: Cephus Shelling, MD;  [facility] LAB;  Service: Cardiovascular;  Laterality: Left;  common/external iliac   PERIPHERAL VASCULAR INTERVENTION Right 08/26/2023   Procedure: PERIPHERAL VASCULAR INTERVENTION;  Surgeon: Maeola Harman, MD;  Location: [facility] INVASIVE CV LAB;  Service: Cardiovascular;  Laterality: Right;  External Illiac   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   Procedure: TRANSMETATARSAL AMPUTATION;  Surgeon: Maeola Harman, MD;  Location: [facility] OR;  Service: Vascular;  Laterality: Right;   TRANSMETATARSAL AMPUTATION Right 08/26/2020   Procedure: TRANSMETATARSAL AMPUTATION REVISION;  Surgeon: Edwin Cap, DPM;  Location: [facility] OR;  Service: Podiatry;  Laterality: Right;   VASECTOMY     WOUND DEBRIDEMENT Right 08/26/2020   Procedure: EXCISION WOUND;  Surgeon: Edwin Cap, DPM;  Location: [facility] OR;  Service: Podiatry;  Laterality: Right;   Social History:   reports that he has been smoking cigarettes. He has a 30 pack-year smoking history. He has quit using smokeless tobacco.  His smokeless tobacco use included chew. He reports that he does not drink alcohol and does not use  drugs.   Family History:  His family history includes Cancer in his mother; Coronary artery disease in his father and mother; Hypertension in his father.   Allergies: No Known Allergies   Home Medications: Prior to Admission medications   Medication Sig Start Date End Date Taking? Authorizing Provider  acetaminophen (TYLENOL) 325 MG tablet Take 2 tablets (650 mg total) by mouth every 4 (four) hours as needed for mild pain or moderate pain (or Fever >/= 101). 09/26/23   Lewie Chamber, MD  albuterol (VENTOLIN HFA) 108 (90 Base) MCG/ACT inhaler INHALE 2 PUFFS INTO THE LUNGS EVERY 4 HOURS AS NEEDED FOR WHEEZING OR SHORTNESS OF BREATH 08/09/21   Babs Sciara, MD  aspirin EC 81 MG tablet Take 1 tablet (81 mg total) by mouth daily. Swallow whole. 08/31/23   Osvaldo Shipper, MD  clopidogrel (PLAVIX) 75 MG tablet Take 1 tablet (75 mg total) by mouth daily. Patient not taking: Reported on 01/01/2024 09/22/23   Alwyn Ren, MD  cyanocobalamin 1000 MCG tablet Take 1 tablet (1,000 mcg total) by mouth daily. 08/31/23   Osvaldo Shipper, MD  FEROSUL 325 (65 Fe) MG tablet TAKE (1) TABLET BY MOUTH ON MONDAY, WEDNESDAY AND FRIDAY. 12/27/23   Babs Sciara, MD  pantoprazole (PROTONIX) 40 MG tablet TAKE (1) TABLET BY MOUTH ONCE DAILY. 09/22/23   Alwyn Ren, MD  rosuvastatin (CRESTOR) 20 MG tablet Take 1 tablet (20 mg total) by mouth daily. 09/22/23   Alwyn Ren, MD  SYMBICORT 160-4.5 MCG/ACT inhaler INHALE (2) PUFFS INTO THE LUNGS TWICE DAILY FOR COPD. 02/07/24   Campbell Riches, NP    Critical care time:   The patient is critically ill with multiple organ system failure and requires high complexity decision making for assessment and support, frequent evaluation and titration of therapies, advanced monitoring, review of radiographic studies and interpretation of complex data.   Critical Care Time devoted to patient care services, exclusive of separately billable procedures,  described in this note is 42 minutes.  Tim Lair, PA-C [facility] 02/28/24 1:06 AM  Please see [email or web address] for pager details.  From 7A-7P if no response, please call [phone] After hours, please call ELink [phone]

## 2024-02-28 NOTE — Procedures (Signed)
 Cortrak  Person Inserting Tube:  Janesa Dockery T, RD Tube Type:  Cortrak - 43 inches Tube Size:  10 Tube Location:  Right nare Initial Placement:  Stomach Secured by: Bridle Technique Used to Measure Tube Placement:  Marking at nare/corner of mouth Cortrak Secured At:  73 cm   Cortrak Tube Team Note:  Consult received to place a Cortrak feeding tube.   No x-ray is required. RN may begin using tube.   If the tube becomes dislodged please keep the tube and contact the Cortrak team at [email or web address] for replacement.  If after hours and replacement cannot be delayed, place a NG tube and confirm placement with an abdominal x-ray.    Shelle Iron RD, LDN Contact via [employer].

## 2024-02-28 NOTE — Progress Notes (Signed)
 Patient hypotensive. Titrated norepinephrine peripherally outside of orders per Henrene Hawking, NP at bedside

## 2024-02-28 NOTE — Progress Notes (Signed)
 Sputum collected and sent to the lab. Lab notified.

## 2024-02-28 NOTE — TOC Initial Note (Signed)
 Transition of Care (TOC) - Initial/Assessment Note    Patient Details  Name: Raymond Walker. MRN: [id number] Date of Birth: 1951-07-02  Transition of Care [facility]) CM/SW Contact:    Marliss Coots, LCSW Phone Number: 02/28/2024, 3:06 PM  Clinical Narrative:                  3:06 PM Per chart review, patient is intubated and from [facility].    Barriers to Discharge: Continued Medical Work up   Patient Goals and CMS Choice            Expected Discharge Plan and Services       Living arrangements for the past 2 months: Assisted Living Facility                                      Prior Living Arrangements/Services Living arrangements for the past 2 months: Assisted Living Facility Lives with:: Facility Resident Patient language and need for interpreter reviewed:: Yes        Need for Family Participation in Patient Care: Yes (Comment) Care giver support system in place?: Yes (comment)   Criminal Activity/Legal Involvement Pertinent to Current Situation/Hospitalization: No - Comment as needed  Activities of Daily Living      Permission Sought/Granted Permission sought to share information with : Facility Medical sales representative, Family Supports Permission granted to share information with : No (Contact informaton on chart)  Share Information with NAME: [name] Financial controller granted to share info w AGENCY: [facility]  Permission granted to share info w Relationship: Niece  Permission granted to share info w Contact Information: [phone]  Emotional Assessment   Attitude/Demeanor/Rapport: Intubated (Following Commands or Not Following Commands) Affect (typically observed): Unable to Assess   Alcohol / Substance Use: Not Applicable Psych Involvement: No (comment)  Admission diagnosis:  Respiratory failure (HCC) [J96.90] Influenza A [J10.1] Acute respiratory failure, unspecified whether with hypoxia or hypercapnia (HCC)  [J96.00] Hypotension, unspecified hypotension type [I95.9] Patient Active Problem List   Diagnosis Date Noted   Hypotension 02/28/2024   Respiratory failure (HCC) 02/27/2024   Atherosclerosis of native arteries of left leg with ulceration of other part of foot (HCC) 01/01/2024   Osteomyelitis of left foot (HCC) 09/20/2023   Osteomyelitis of foot, left, acute (HCC) 08/22/2023   Status post transmetatarsal amputation of foot, 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   Right ureteral stone 01/01/2022   Abdominal pain 01/01/2022   Sepsis secondary to UTI (HCC) 12/06/2021   Leukocytosis 12/06/2021   Hypoalbuminemia due to protein-calorie malnutrition (HCC) 12/06/2021   Lactic acidosis 12/06/2021   AKI (acute kidney injury) (HCC) 12/06/2021   Essential hypertension 12/06/2021   Mixed hyperlipidemia 12/06/2021   GERD (gastroesophageal reflux disease) 12/06/2021   Nephrolithiasis 11/21/2021   Low back pain 09/19/2021   Cognitive dysfunction 06/23/2020   History of seizures 05/31/2020   Erectile dysfunction 05/02/2020   Caregiver with fatigue 05/02/2020   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HCC)    Goals of care, counseling/discussion    Palliative care by specialist    DNR (do not resuscitate)    Vitamin D deficiency 01/24/2020   Sepsis (HCC) 01/23/2020   Altered mental status    Hypokalemia    History of kidney stones    Hematuria    UTI (urinary tract infection)    CHRONIC Bladder outlet obstruction    PAD (peripheral artery disease) (HCC) 09/15/2019   Protein-calorie malnutrition,  severe 08/21/2019   Cellulitis of right lower extremity    Impaired glucose tolerance    COPD (chronic obstructive pulmonary disease) (HCC)    Thrombocytopenia (HCC) 08/10/2018   Aortic atherosclerosis (HCC) 10/14/2016   Coronary artery calcification seen on CAT scan 10/14/2016   Other emphysema (HCC) 10/14/2016   Elevated blood pressure 12/03/2011   Chest pain 12/03/2011   Tobacco abuse 12/03/2011   Right leg pain  12/03/2011   PCP:  Babs Sciara, MD Pharmacy:   [facility] [address] [place] [address] Phone: [phone] Fax: [phone]     Social Drivers of Health (SDOH) Social History: SDOH Screenings   Food Insecurity: Patient Unable To Answer (02/28/2024)  Recent Concern: Food Insecurity - Food Insecurity Present (12/25/2023)  Housing: Patient Unable To Answer (02/28/2024)  Transportation Needs: Patient Unable To Answer (02/28/2024)  Utilities: Not At Risk (09/20/2023)  Alcohol Screen: Low Risk  (09/19/2021)  Depression (PHQ2-9): Medium Risk (09/11/2023)  Financial Resource Strain: High Risk (12/25/2023)  Physical Activity: Unknown (03/13/2023)  Social Connections: Patient Unable To Answer (02/28/2024)  Recent Concern: Social Connections - Socially Isolated (12/25/2023)  Stress: Stress Concern Present (12/25/2023)  Tobacco Use: High Risk (02/27/2024)   SDOH Interventions:     Readmission Risk Interventions    12/12/2021    2:38 PM  Readmission Risk Prevention Plan  Transportation Screening Complete  PCP or Specialist Appt within 5-7 Days Complete  Home Care Screening Complete  Medication Review (RN CM) Complete

## 2024-02-28 NOTE — Progress Notes (Signed)
 eLink Physician-Brief Progress Note Patient Name: Raymond Walker. DOB: July 15, 1951 MRN: [id number]   Date of Service  02/28/2024  HPI/Events of Note  Patient with a history of COPD, acute Influenza A infection transferred from outside hospital with acute hypoxemic respiratory failure and altered mental status, and intubated in the ED for airway protection after they failed trial of BIPAP.  eICU Interventions  New Patient Evaluation.        Kayren Holck U Providencia Hottenstein 02/28/2024, 1:19 AM

## 2024-02-29 ENCOUNTER — Other Ambulatory Visit: Payer: Self-pay | Admitting: Medical Genetics

## 2024-02-29 DIAGNOSIS — J9601 Acute respiratory failure with hypoxia: Secondary | ICD-10-CM

## 2024-02-29 DIAGNOSIS — I10 Essential (primary) hypertension: Secondary | ICD-10-CM

## 2024-02-29 DIAGNOSIS — J9602 Acute respiratory failure with hypercapnia: Secondary | ICD-10-CM

## 2024-02-29 DIAGNOSIS — J09X2 Influenza due to identified novel influenza A virus with other respiratory manifestations: Secondary | ICD-10-CM | POA: Diagnosis not present

## 2024-02-29 DIAGNOSIS — R579 Shock, unspecified: Secondary | ICD-10-CM | POA: Diagnosis not present

## 2024-02-29 DIAGNOSIS — K219 Gastro-esophageal reflux disease without esophagitis: Secondary | ICD-10-CM

## 2024-02-29 LAB — BASIC METABOLIC PANEL
Anion gap: 7 (ref 5–15)
BUN: 40 mg/dL — ABNORMAL HIGH (ref 8–23)
CO2: 21 mmol/L — ABNORMAL LOW (ref 22–32)
Calcium: 8.3 mg/dL — ABNORMAL LOW (ref 8.9–10.3)
Chloride: 108 mmol/L (ref 98–111)
Creatinine, Ser: 1.98 mg/dL — ABNORMAL HIGH (ref 0.61–1.24)
GFR, Estimated: 35 mL/min — ABNORMAL LOW (ref >60)
Glucose, Bld: 205 mg/dL — ABNORMAL HIGH (ref 70–99)
Potassium: 4.8 mmol/L (ref 3.5–5.1)
Sodium: 136 mmol/L (ref 135–145)

## 2024-02-29 LAB — CBC WITH DIFFERENTIAL/PLATELET
Abs Immature Granulocytes: 0.1 10*3/uL — ABNORMAL HIGH (ref 0.00–0.07)
Basophils Absolute: 0 10*3/uL (ref 0.0–0.1)
Basophils Relative: 0 %
Eosinophils Absolute: 0 10*3/uL (ref 0.0–0.5)
Eosinophils Relative: 0 %
HCT: 43.8 % (ref 39.0–52.0)
Hemoglobin: 14 g/dL (ref 13.0–17.0)
Immature Granulocytes: 1 %
Lymphocytes Relative: 10 %
Lymphs Abs: 1.6 10*3/uL (ref 0.7–4.0)
MCH: 26.6 pg (ref 26.0–34.0)
MCHC: 32 g/dL (ref 30.0–36.0)
MCV: 83.3 fL (ref 80.0–100.0)
Monocytes Absolute: 1.2 10*3/uL — ABNORMAL HIGH (ref 0.1–1.0)
Monocytes Relative: 7 %
Neutro Abs: 14.1 10*3/uL — ABNORMAL HIGH (ref 1.7–7.7)
Neutrophils Relative %: 82 %
Platelets: 145 10*3/uL — ABNORMAL LOW (ref 150–400)
RBC: 5.26 MIL/uL (ref 4.22–5.81)
RDW: 18.3 % — ABNORMAL HIGH (ref 11.5–15.5)
WBC: 17 10*3/uL — ABNORMAL HIGH (ref 4.0–10.5)
nRBC: 0 % (ref 0.0–0.2)

## 2024-02-29 LAB — GLUCOSE, CAPILLARY: Glucose-Capillary: 145 mg/dL — ABNORMAL HIGH (ref 70–99)

## 2024-02-29 LAB — MAGNESIUM: Magnesium: 2.1 mg/dL (ref 1.7–2.4)

## 2024-02-29 LAB — LEGIONELLA PNEUMOPHILA SEROGP 1 UR AG: L. pneumophila Serogp 1 Ur Ag: NEGATIVE

## 2024-02-29 LAB — LACTIC ACID, PLASMA: Lactic Acid, Venous: 1.3 mmol/L (ref 0.5–1.9)

## 2024-02-29 LAB — PHOSPHORUS: Phosphorus: 4.6 mg/dL (ref 2.5–4.6)

## 2024-02-29 MED ORDER — GLYCOPYRROLATE 0.2 MG/ML IJ SOLN: 0.2000 mg | INTRAMUSCULAR | Status: DC | PRN

## 2024-02-29 MED ORDER — FENTANYL 2500MCG IN NS 250ML (10MCG/ML) PREMIX INFUSION: 0.0000 ug/h | INTRAVENOUS | Status: DC

## 2024-02-29 MED ORDER — GLYCOPYRROLATE 1 MG PO TABS: 1.0000 mg | ORAL_TABLET | ORAL | Status: DC | PRN

## 2024-02-29 MED ORDER — FENTANYL BOLUS VIA INFUSION: 100.0000 ug | INTRAVENOUS | Status: DC | PRN

## 2024-02-29 MED ORDER — LACTATED RINGERS IV SOLN: INTRAVENOUS | Status: DC

## 2024-02-29 MED ORDER — ACETAMINOPHEN 325 MG PO TABS: 650.0000 mg | ORAL_TABLET | Freq: Four times a day (QID) | ORAL | Status: DC | PRN

## 2024-02-29 MED ORDER — MIDAZOLAM HCL 2 MG/2ML IJ SOLN: 2.0000 mg | INTRAMUSCULAR | Status: DC | PRN

## 2024-02-29 MED ORDER — FENTANYL BOLUS VIA INFUSION
100.0000 ug | INTRAVENOUS | Status: DC | PRN
  Administered 2024-02-29: 100 ug via INTRAVENOUS

## 2024-02-29 MED ORDER — POLYVINYL ALCOHOL 1.4 % OP SOLN: 1.0000 [drp] | Freq: Four times a day (QID) | OPHTHALMIC | Status: DC | PRN

## 2024-02-29 MED ORDER — LACTATED RINGERS IV SOLN: INTRAVENOUS | Status: AC

## 2024-02-29 MED ORDER — FENTANYL 2500MCG IN NS 250ML (10MCG/ML) PREMIX INFUSION
0.0000 ug/h | INTRAVENOUS | Status: DC
  Administered 2024-02-29: 100 ug/h via INTRAVENOUS
  Filled 2024-02-29: qty 250

## 2024-02-29 MED ORDER — ATROPINE SULFATE 1 MG/10ML IJ SOSY
PREFILLED_SYRINGE | INTRAMUSCULAR | Status: AC
  Filled 2024-02-29: qty 10

## 2024-02-29 MED ORDER — MIDAZOLAM HCL 2 MG/2ML IJ SOLN
2.0000 mg | Freq: Once | INTRAMUSCULAR | Status: AC
  Administered 2024-02-29: 2 mg via INTRAVENOUS

## 2024-02-29 MED ORDER — SODIUM CHLORIDE 0.9 % IV SOLN: INTRAVENOUS | Status: DC

## 2024-02-29 MED ORDER — FENTANYL BOLUS VIA INFUSION: 20.0000 ug | INTRAVENOUS | Status: DC | PRN

## 2024-02-29 MED ORDER — MIDAZOLAM HCL 2 MG/2ML IJ SOLN
INTRAMUSCULAR | Status: AC
  Filled 2024-02-29: qty 2

## 2024-02-29 MED ORDER — ACETAMINOPHEN 650 MG RE SUPP: 650.0000 mg | Freq: Four times a day (QID) | RECTAL | Status: DC | PRN

## 2024-03-01 LAB — CULTURE, RESPIRATORY W GRAM STAIN

## 2024-03-02 LAB — GLUCOSE, CAPILLARY: Glucose-Capillary: 112 mg/dL — ABNORMAL HIGH (ref 70–99)

## 2024-03-04 LAB — CULTURE, BLOOD (ROUTINE X 2)
Culture: NO GROWTH
Culture: NO GROWTH

## 2024-03-17 NOTE — Progress Notes (Signed)
 Cardiac time of death 73. Verified with this RN and Christina B,RN. Pt's niece has been updated of cardiac time of death. of fent wasted in steri cycle with this RN and Christina B,RN.

## 2024-03-17 NOTE — IPAL (Signed)
  Interdisciplinary Goals of Care Family Meeting   Date carried out:   Location of the meeting: Phone conference  Member's involved: Physician and Family Member or next of kin.  Niece [name] Proofreader or Environmental health practitioner:   Niece  Discussion: We discussed goals of care for [employer]. .   This month went into a bradycardic arrest today morning.  He remains on the ventilator with multiorgan failure due to fluid infection and sepsis Discussed with his niece over the telephone.  She confirmed his DNR status and agreed for transition to comfort measures.  Code status:   Code Status: Do not attempt resuscitation (DNR) - Comfort care   Disposition: In-patient comfort care  Time spent for the meeting: 10 mins  Chilton Greathouse MD [facility] See Amion for pager  If no response to pager , please call [phone] until 7pm After 7:00 pm call Elink  [phone] , 7:52 AM

## 2024-03-17 NOTE — Progress Notes (Signed)
 NAME:  Raymond Walker., MRN:  [id number], DOB:  January 09, 1951, LOS: 2 ADMISSION DATE:  02/27/2024 CONSULTATION DATE:  02/28/2024 REFERRING MD:  Rhae Hammock - EDP ([facility]) CHIEF COMPLAINT:  AECOPD 2/2 Flu A with hypoxemia requiring intubation   History of Present Illness:  73 year old man who presented to [facility] 3/14 as a transfer from [facility] for AECOPD and hypoxemia requiring intubation. PMHx significant for HTN, COPD, PAD (on DAPT) s/p R foot TMA, osteomyelitis, recurrent pancreatitis, nephrolithiasis, remote seizure history (ages 33-11), cognitive/developmental delay.   Patient initially presented to [facility] from [facility] with SOB x 1 week. Noted to be wheezing on arrival and neb administered by EMS. SpO2 was reportedly 80% on RA and patient was placed on NRB; BiPAP was initiated after patient was noted to have increased WOB and unfortunately patient was unable to tolerate this. He had persistent decline in his mental status; per family/HCPOA, patient is a DNR but would want intubation. Subsequently intubated for airway protection.   Labs were notable for WBC 10.7, Hgb 14.4, Plt 160. Na 141, K 4.6, CO2 29, Cr 1.13. Trop 59. BNP 169. VBG pH 7.1/CO2 105/bicarb 33. Flu A positive. Post-intubation ABG 7.22/56/287/23.1. CXR demonstrated emphysematous changes.   Given patient's worsening clinical status and s/p intubation, transferred to [facility] for further care.  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 C/f AECOPD. Flu A+. NRB > BiPAP > intubated for airway protection. CXR with emphysematous changes. 3/14 - Transferred to [facility] for further care post-intubation.    Significant Hospital Events: Including procedures, antibiotic start and stop dates in addition to other pertinent events   3/13 - Presented to [facility] for SOB x 1 week, wheezing. C/f AECOPD. Flu A+. NRB > BiPAP > intubated for airway protection. CXR with emphysematous changes. 3/14 - Transferred to [facility] for further care post-intubation. 3/14 Broad spectrum antibiotics and levophed for shock  Interim History / Subjective:  Atrial flutter>fibrillation overnight. Elevated D-dmer yesterday; could not be taken to CT for PE studies as he could not tolerate laying flat on the bed without desaturating. Continued on levophed overnight for pressor support and on ventilator, otherwise, VSS.  Prior to rounding, patient HR was 36 with ST segment elevation on cardiac telemetry. Patient is DNR with intubation. Dr. Isaiah Serge communicated with POA about his current status and poor prognosis. After discussion with family, patient has been transitioned to comfort care. Orders placed. Objective   Blood pressure 102/77, pulse 69, temperature 98.6 F (37 C), resp.  rate (!) 21, height 5\' 11"  (1.803 m), weight 68.8 kg, SpO2 (!) 89%.    Vent Mode: PRVC FiO2 (%):  [40 %] 40 % Set Rate:  [12 bmp-30 bmp] 26 bmp Vt Set:  [550 mL] 550 mL PEEP:  [5 cmH20] 5 cmH20 Plateau Pressure:  [18 cmH20-21 cmH20] 18 cmH20   Intake/Output Summary (Last 24 hours) at  0747 Last data filed at  0400 Gross per 24 hour  Intake 2994.27 ml  Output 190 ml  Net 2804.27 ml    Filed Weights   02/27/24 2115 02/28/24 0127  0222  Weight: 63 kg 67 kg 68.8 kg   Assessment & Plan:   [name]. This 72yo with developmental delay, COPD, advanced PAD admitted for acute hypoxemic respiratory failure in setting of influenza A complicated by shock septic vs cardiogenic with new bradycardia and STEMI on telemetry now comfort care  COMFORT CARE Undifferentiated shock Acute hypoxemic respiratory failure COPD exacerbation due to influenza A Atrial flutter - Family aware - Comfort orders placed and unnecessary orders discontinued - Expect in-hospital death within 24 hrs  Best Practice (right click and "Reselect all SmartList Selections" daily)   Comfort care  Labs   CBC: Recent Labs  Lab 02/27/24 2148 02/28/24 0322 02/28/24 0325 02/28/24 0528 02/28/24 1304 02/28/24 1600 02/28/24 1638  0638  WBC 10.7*  --  17.7*  --   --   --   --  17.0*  NEUTROABS  --   --   --   --   --   --   --  14.1*  HGB 14.4   < > 15.0 15.3 15.0 15.0 13.9 14.0  HCT 46.6   < > 47.6 45.0 44.0 44.0 41.0 43.8  MCV 86.9  --  84.4  --   --   --   --  83.3  PLT 160  --  197  --   --   --   --  145*   < > = values in this interval not displayed.    Basic Metabolic Panel: Recent Labs  Lab 02/27/24 2148 02/28/24 0322 02/28/24 0325 02/28/24 0528 02/28/24 1047 02/28/24 1304 02/28/24 1600 02/28/24 1638  0230  0638  NA 141   < > 138 139  --  141 142 138  --  136  K 4.6   < > 4.2 4.4  --  3.9 4.9 4.7  --  4.8  CL 103  --  106  --   --   --   --   --   --  108  CO2 29  --  21*  --   --   --   --   --   --  21*  GLUCOSE 203*  --  234*  --   --   --   --   --   --  205*  BUN 23  --  24*  --   --   --   --   --   --  40*  CREATININE 1.13  --  1.52*  --   --   --   --   --   --  1.98*  CALCIUM 8.6*  --  8.5*  --   --   --   --   --   --  8.3*  MG  --   --  2.6*  --   --   --   --   --  2.1  --  PHOS  --   --  6.3*  --  5.9*  --   --   --  4.6   --    < > = values in this interval not displayed.   GFR: Estimated Creatinine Clearance: 32.3 mL/min (A) (by C-G formula based on SCr of 1.98 mg/dL (H)). Recent Labs  Lab 02/27/24 2148 02/28/24 0324 02/28/24 0325 02/28/24 1047  4782  PROCALCITON  --   --  0.15  --   --   WBC 10.7*  --  17.7*  --  17.0*  LATICACIDVEN  --  1.7  --  3.1* 1.3    Liver Function Tests: No results for input(s): "AST", "ALT", "ALKPHOS", "BILITOT", "PROT", "ALBUMIN" in the last 168 hours. No results for input(s): "LIPASE", "AMYLASE" in the last 168 hours. No results for input(s): "AMMONIA" in the last 168 hours.  ABG    Component Value Date/Time   PHART 7.256 (L) 02/28/2024 1638   PCO2ART 49.5 (H) 02/28/2024 1638   PO2ART 171 (H) 02/28/2024 1638   HCO3 22.0 02/28/2024 1638   TCO2 23 02/28/2024 1638   ACIDBASEDEF 5.0 (H) 02/28/2024 1638   O2SAT 99 02/28/2024 1638     Coagulation Profile: No results for input(s): "INR", "PROTIME" in the last 168 hours.  Cardiac Enzymes: No results for input(s): "CKTOTAL", "CKMB", "CKMBINDEX", "TROPONINI" in the last 168 hours.  HbA1C: Hgb A1c MFr Bld  Date/Time Value Ref Range Status  08/22/2023 08:59 PM 6.1 (H) 4.8 - 5.6 % Final    Comment:    (NOTE) Pre diabetes:          5.7%-6.4%  Diabetes:              >6.4%  Glycemic control for   <7.0% adults with diabetes   06/02/2021 11:01 AM 6.2 (H) 4.8 - 5.6 % Final    Comment:             Prediabetes: 5.7 - 6.4          Diabetes: >6.4          Glycemic control for adults with diabetes: <7.0     CBG: Recent Labs  Lab 02/28/24 1114 02/28/24 1532 02/28/24 1931 02/28/24 2320  0317  GLUCAP 234* 133* 148* 147* 145*    Review of Systems:     Past Medical History:  He,  has a past medical history of Caregiver with fatigue (05/02/2020), COPD (chronic obstructive pulmonary disease) (HCC), Deformity, Erectile dysfunction (05/02/2020), Full dentures, History of kidney stones, History  of seizures (05/31/2020), Hypertension, Impaired glucose tolerance, PAD (peripheral artery disease) (HCC), Pancreatitis, recurrent, Pneumonia, Status post transmetatarsal amputation of foot, right (HCC) (12/12/2022), and Thrombocytopenia (HCC) (08/10/2018).   Surgical History:   Past Surgical History:  Procedure Laterality Date   ABDOMINAL AORTOGRAM W/LOWER EXTREMITY Bilateral 08/20/2019   Procedure: ABDOMINAL AORTOGRAM W/LOWER EXTREMITY;  Surgeon: Cephus Shelling, MD;  Location: [facility] INVASIVE CV LAB;  Service: Cardiovascular;  Laterality: Bilateral;   ABDOMINAL AORTOGRAM W/LOWER EXTREMITY N/A 08/26/2023   Procedure: ABDOMINAL AORTOGRAM W/LOWER EXTREMITY;  Surgeon: Maeola Harman, MD;  Location: [facility] INVASIVE CV LAB;  Service: Cardiovascular;  Laterality: N/A;   ACHILLES TENDON SURGERY Right 08/26/2020   Procedure: ACHILLES TENDON LENGTHENING;  Surgeon: Edwin Cap, DPM;  Location: [facility] OR;  Service: Podiatry;  Laterality: Right;   AMPUTATION Right 09/15/2019   Procedure: AMPUTATION RIGHT TOES One, Two, And Three;  Surgeon: Maeola Harman, MD;  Location: [facility] OR;  Service: Vascular;  Laterality:  Right;   AMPUTATION Left 09/21/2023   Procedure: AMPUTATION DIGIT;  Surgeon: Felecia Shelling, DPM;  Location: [facility] ORS;  Service: Orthopedics/Podiatry;  Laterality: Left;   APPENDECTOMY     CATARACT EXTRACTION W/PHACO Right 05/02/2018   Procedure: CATARACT EXTRACTION PHACO AND INTRAOCULAR LENS PLACEMENT (IOC);  Surgeon: Fabio Pierce, MD;  Location: [facility] ORS;  Service: Ophthalmology;  Laterality: Right;  CDE: 11.11   CATARACT EXTRACTION W/PHACO Left 07/04/2018   Procedure: CATARACT EXTRACTION PHACO AND INTRAOCULAR LENS PLACEMENT (IOC);  Surgeon: Fabio Pierce, MD;  Location: [facility] ORS;  Service: Ophthalmology;  Laterality: Left;  CDE: 7.15   CHOLECYSTECTOMY     CYSTOSCOPY W/ URETERAL STENT PLACEMENT Right 11/22/2021   Procedure: CYSTOSCOPY WITH RETROGRADE PYELOGRAM/URETERAL STENT PLACEMENT;   Surgeon: Marcine Matar, MD;  Location: [facility] ORS;  Service: Urology;  Laterality: Right;   CYSTOSCOPY WITH RETROGRADE PYELOGRAM, URETEROSCOPY AND STENT PLACEMENT Right 02/01/2022   Procedure: CYSTOSCOPY WITH RETROGRADE PYELOGRAM, URETEROSCOPY AND STENT PLACEMENT;  Surgeon: Malen Gauze, MD;  Location: [facility] ORS;  Service: Urology;  Laterality: Right;   ENDARTERECTOMY FEMORAL Left 08/27/2023   Procedure: RIGHT FEMORAL ENDARTERECTOMY;  Surgeon: Maeola Harman, MD;  Location: [facility] OR;  Service: Vascular;  Laterality: Left;   FEMORAL-POPLITEAL BYPASS GRAFT Right 09/15/2019   Procedure: Right BYPASS GRAFT FEMORAL to Above Knee POPLITEAL ARTERY;  Surgeon: Maeola Harman, MD;  Location: [facility] OR;  Service: Vascular;  Laterality: Right;   FEMORAL-POPLITEAL BYPASS GRAFT Right 01/26/2020   Procedure: IRRIGATION AND DEBRIDEMENT RIGHT FEMORAL POPLITEAL BYPASS SITE;  Surgeon: Maeola Harman, MD;  Location: [facility] OR;  Service: Vascular;  Laterality: Right;   FEMORAL-POPLITEAL BYPASS GRAFT Left 08/27/2023   Procedure: BYPASS GRAFT LEFT COMMON FEMORAL-POPLITEAL ARTERY;  Surgeon: Maeola Harman, MD;  Location: [facility] OR;  Service: Vascular;  Laterality: Left;   HOLMIUM LASER APPLICATION Right 02/01/2022   Procedure: HOLMIUM LASER APPLICATION;  Surgeon: Malen Gauze, MD;  Location: [facility]S;  Service: Urology;  Laterality: Right;   MULTIPLE TOOTH EXTRACTIONS     ORCHIECTOMY     PERIPHERAL VASCULAR INTERVENTION Left 08/20/2019   Procedure: PERIPHERAL VASCULAR INTERVENTION;  Surgeon: Cephus Shelling, MD;  [facility]: [facility] INVASIVE CV LAB;  Service: Cardiovascular;  Laterality: Left;  common/external iliac   PERIPHERAL VASCULAR INTERVENTION Right 08/26/2023   Procedure: PERIPHERAL VASCULAR INTERVENTION;  Surgeon: Maeola Harman, MD;  Location: [facility] INVASIVE CV LAB;  Service: Cardiovascular;  Laterality: Right;  External Illiac   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    Procedure: TRANSMETATARSAL AMPUTATION;  Surgeon: Maeola Harman, MD;  Location: [facility] OR;  Service: Vascular;  Laterality: Right;   TRANSMETATARSAL AMPUTATION Right 08/26/2020   Procedure: TRANSMETATARSAL AMPUTATION REVISION;  Surgeon: Edwin Cap, DPM;  Location: [facility] OR;  Service: Podiatry;  Laterality: Right;   VASECTOMY     WOUND DEBRIDEMENT Right 08/26/2020   Procedure: EXCISION WOUND;  Surgeon: Edwin Cap, DPM;  Location: [facility] OR;  Service: Podiatry;  Laterality: Right;     Social History:   reports that he has been smoking cigarettes. He has a 30 pack-year smoking history. He has quit using smokeless tobacco.  His smokeless tobacco use included chew. He reports that he does not drink alcohol and does not use drugs.   Family History:  His family history includes Cancer in his mother; Coronary artery disease in his father and mother; Hypertension in his father.   Allergies No Known Allergies   Home Medications  Prior to Admission medications   Medication Sig  Start Date End Date Taking? Authorizing Provider  acetaminophen (TYLENOL) 325 MG tablet Take 2 tablets (650 mg total) by mouth every 4 (four) hours as needed for mild pain or moderate pain (or Fever >/= 101). 09/26/23   Lewie Chamber, MD  albuterol (VENTOLIN HFA) 108 (90 Base) MCG/ACT inhaler INHALE 2 PUFFS INTO THE LUNGS EVERY 4 HOURS AS NEEDED FOR WHEEZING OR SHORTNESS OF BREATH 08/09/21   Babs Sciara, MD  aspirin EC 81 MG tablet Take 1 tablet (81 mg total) by mouth daily. Swallow whole. 08/31/23   Osvaldo Shipper, MD  clopidogrel (PLAVIX) 75 MG tablet Take 1 tablet (75 mg total) by mouth daily. Patient not taking: Reported on 01/01/2024 09/22/23   Alwyn Ren, MD  cyanocobalamin 1000 MCG tablet Take 1 tablet (1,000 mcg total) by mouth daily. 08/31/23   Osvaldo Shipper, MD  FEROSUL 325 (65 Fe) MG tablet TAKE (1) TABLET BY MOUTH ON MONDAY, WEDNESDAY AND FRIDAY. 12/27/23   Babs Sciara, MD  pantoprazole  (PROTONIX) 40 MG tablet TAKE (1) TABLET BY MOUTH ONCE DAILY. 09/22/23   Alwyn Ren, MD  rosuvastatin (CRESTOR) 20 MG tablet Take 1 tablet (20 mg total) by mouth daily. 09/22/23   Alwyn Ren, MD  SYMBICORT 160-4.5 MCG/ACT inhaler INHALE (2) PUFFS INTO THE LUNGS TWICE DAILY FOR COPD. 02/07/24   Campbell Riches, NP     Critical care time:       Morene Crocker, MD [facility] Internal Medicine Program - PGY-2 , 7:47 AM Pager# [phone]

## 2024-03-17 NOTE — Death Summary Note (Signed)
  Name: Raymond Walker. MRN: [id number] DOB: 09/21/1951 73 y.o.  Date of Admission: 02/27/2024  7:57 PM Date of Discharge: 03/03/2024 Attending Physician: No att. providers found  Discharge Diagnosis: Principal Problem:   Respiratory failure (HCC) Active Problems:   Hypotension   Cause of death: Cardiac arrest  Time of death: 1927  Disposition and follow-up:   [name] was discharged from [place] in expired condition.    Hospital Course: Raymond Walker. Is a 72yoM with HTN, COPD, PAD (on DAPT) s/p R foot TMA, osteomyelitis, recurrent pancreatitis, nephrolithiasis, remote seizure history (ages 15-11), cognitive/developmental delay who presented from long term caring facility for acute hypoxic respiratory failure secondary to influenza A infection who was intubated for airway protection. Patient's course was further complicated by bradycardia and subsequent cardiac arrest. Patient was DNR/DNI on admission. After the acute change, medical team communicated with HCPOA who decided to transition Raymond Pacific Jr.'s status to comfort care. Patient expired on  at 1927.  Signed: Morene Crocker, MD 03/03/2024, 5:44 PM

## 2024-03-17 NOTE — Progress Notes (Signed)
Pt extubated to comfort care per order with RN at bedside.

## 2024-03-17 DEATH — deceased

## 2024-06-01 ENCOUNTER — Ambulatory Visit: Payer: 59 | Admitting: Family Medicine
